# Patient Record
Sex: Male | Born: 1935 | Race: Black or African American | Hispanic: No | Marital: Single | Smoking: Former smoker
Health system: Southern US, Community
[De-identification: ages and names within clinical notes are randomized; demographics above are authoritative.]

## PROBLEM LIST (undated history)

## (undated) DIAGNOSIS — K269 Duodenal ulcer, unspecified as acute or chronic, without hemorrhage or perforation: Secondary | ICD-10-CM

## (undated) DIAGNOSIS — I429 Cardiomyopathy, unspecified: Secondary | ICD-10-CM

## (undated) DIAGNOSIS — I739 Peripheral vascular disease, unspecified: Secondary | ICD-10-CM

## (undated) DIAGNOSIS — D649 Anemia, unspecified: Secondary | ICD-10-CM

## (undated) DIAGNOSIS — K649 Unspecified hemorrhoids: Secondary | ICD-10-CM

## (undated) DIAGNOSIS — I10 Essential (primary) hypertension: Secondary | ICD-10-CM

## (undated) DIAGNOSIS — J45909 Unspecified asthma, uncomplicated: Secondary | ICD-10-CM

## (undated) DIAGNOSIS — Z950 Presence of cardiac pacemaker: Secondary | ICD-10-CM

## (undated) DIAGNOSIS — R0602 Shortness of breath: Secondary | ICD-10-CM

## (undated) DIAGNOSIS — I499 Cardiac arrhythmia, unspecified: Secondary | ICD-10-CM

## (undated) DIAGNOSIS — M1990 Unspecified osteoarthritis, unspecified site: Secondary | ICD-10-CM

## (undated) DIAGNOSIS — I495 Sick sinus syndrome: Secondary | ICD-10-CM

## (undated) DIAGNOSIS — I509 Heart failure, unspecified: Secondary | ICD-10-CM

## (undated) DIAGNOSIS — C61 Malignant neoplasm of prostate: Secondary | ICD-10-CM

## (undated) DIAGNOSIS — I4891 Unspecified atrial fibrillation: Secondary | ICD-10-CM

## (undated) DIAGNOSIS — K2210 Ulcer of esophagus without bleeding: Secondary | ICD-10-CM

## (undated) DIAGNOSIS — E785 Hyperlipidemia, unspecified: Secondary | ICD-10-CM

## (undated) DIAGNOSIS — K449 Diaphragmatic hernia without obstruction or gangrene: Secondary | ICD-10-CM

## (undated) HISTORY — PX: PACEMAKER INSERTION: SHX728

## (undated) HISTORY — DX: Anemia, unspecified: D64.9

## (undated) HISTORY — PX: CATARACT EXTRACTION, BILATERAL: SHX1313

## (undated) HISTORY — PX: JOINT REPLACEMENT: SHX530

## (undated) HISTORY — DX: Presence of cardiac pacemaker: Z95.0

## (undated) HISTORY — PX: INSERT / REPLACE / REMOVE PACEMAKER: SUR710

## (undated) HISTORY — PX: PROSTATE SURGERY: SHX751

## (undated) HISTORY — PX: EYE SURGERY: SHX253

---

## 2004-09-30 ENCOUNTER — Other Ambulatory Visit: Payer: Self-pay

## 2004-10-07 ENCOUNTER — Inpatient Hospital Stay: Payer: Self-pay | Admitting: Specialist

## 2006-09-04 ENCOUNTER — Emergency Department: Payer: Self-pay | Admitting: Emergency Medicine

## 2006-10-22 ENCOUNTER — Emergency Department: Payer: Self-pay | Admitting: Emergency Medicine

## 2006-11-04 ENCOUNTER — Emergency Department: Payer: Self-pay | Admitting: Emergency Medicine

## 2008-08-09 ENCOUNTER — Ambulatory Visit: Payer: Self-pay | Admitting: Internal Medicine

## 2009-01-12 ENCOUNTER — Ambulatory Visit: Payer: Self-pay | Admitting: Internal Medicine

## 2010-01-17 ENCOUNTER — Ambulatory Visit: Payer: Self-pay | Admitting: Internal Medicine

## 2010-01-18 ENCOUNTER — Ambulatory Visit: Payer: Self-pay | Admitting: Internal Medicine

## 2011-02-22 ENCOUNTER — Emergency Department: Payer: Self-pay | Admitting: Unknown Physician Specialty

## 2013-12-29 ENCOUNTER — Ambulatory Visit: Payer: Self-pay | Admitting: Internal Medicine

## 2014-08-23 ENCOUNTER — Emergency Department: Payer: Self-pay | Admitting: Emergency Medicine

## 2015-08-06 ENCOUNTER — Encounter: Payer: Self-pay | Admitting: Emergency Medicine

## 2015-08-06 ENCOUNTER — Observation Stay
Admission: EM | Admit: 2015-08-06 | Discharge: 2015-08-09 | Disposition: A | Discharge status: Home / Self Care | Payer: Medicare Other | Attending: Internal Medicine | Admitting: Internal Medicine

## 2015-08-06 ENCOUNTER — Emergency Department: Payer: Medicare Other

## 2015-08-06 DIAGNOSIS — I495 Sick sinus syndrome: Secondary | ICD-10-CM | POA: Diagnosis not present

## 2015-08-06 DIAGNOSIS — I509 Heart failure, unspecified: Secondary | ICD-10-CM | POA: Diagnosis not present

## 2015-08-06 DIAGNOSIS — Z23 Encounter for immunization: Secondary | ICD-10-CM | POA: Insufficient documentation

## 2015-08-06 DIAGNOSIS — N281 Cyst of kidney, acquired: Secondary | ICD-10-CM | POA: Diagnosis not present

## 2015-08-06 DIAGNOSIS — K2950 Unspecified chronic gastritis without bleeding: Secondary | ICD-10-CM | POA: Insufficient documentation

## 2015-08-06 DIAGNOSIS — Z87891 Personal history of nicotine dependence: Secondary | ICD-10-CM | POA: Insufficient documentation

## 2015-08-06 DIAGNOSIS — Z9660 Presence of unspecified orthopedic joint implant: Secondary | ICD-10-CM | POA: Diagnosis not present

## 2015-08-06 DIAGNOSIS — Z8546 Personal history of malignant neoplasm of prostate: Secondary | ICD-10-CM | POA: Diagnosis not present

## 2015-08-06 DIAGNOSIS — B9681 Helicobacter pylori [H. pylori] as the cause of diseases classified elsewhere: Secondary | ICD-10-CM | POA: Insufficient documentation

## 2015-08-06 DIAGNOSIS — R1011 Right upper quadrant pain: Secondary | ICD-10-CM | POA: Insufficient documentation

## 2015-08-06 DIAGNOSIS — K259 Gastric ulcer, unspecified as acute or chronic, without hemorrhage or perforation: Secondary | ICD-10-CM | POA: Diagnosis not present

## 2015-08-06 DIAGNOSIS — Z950 Presence of cardiac pacemaker: Secondary | ICD-10-CM | POA: Diagnosis not present

## 2015-08-06 DIAGNOSIS — E785 Hyperlipidemia, unspecified: Secondary | ICD-10-CM | POA: Diagnosis not present

## 2015-08-06 DIAGNOSIS — Z9079 Acquired absence of other genital organ(s): Secondary | ICD-10-CM | POA: Insufficient documentation

## 2015-08-06 DIAGNOSIS — Z79899 Other long term (current) drug therapy: Secondary | ICD-10-CM | POA: Diagnosis not present

## 2015-08-06 DIAGNOSIS — K269 Duodenal ulcer, unspecified as acute or chronic, without hemorrhage or perforation: Secondary | ICD-10-CM | POA: Diagnosis not present

## 2015-08-06 DIAGNOSIS — K449 Diaphragmatic hernia without obstruction or gangrene: Secondary | ICD-10-CM | POA: Insufficient documentation

## 2015-08-06 DIAGNOSIS — I129 Hypertensive chronic kidney disease with stage 1 through stage 4 chronic kidney disease, or unspecified chronic kidney disease: Secondary | ICD-10-CM | POA: Insufficient documentation

## 2015-08-06 DIAGNOSIS — M549 Dorsalgia, unspecified: Secondary | ICD-10-CM | POA: Diagnosis not present

## 2015-08-06 DIAGNOSIS — R1013 Epigastric pain: Secondary | ICD-10-CM | POA: Diagnosis present

## 2015-08-06 DIAGNOSIS — I2510 Atherosclerotic heart disease of native coronary artery without angina pectoris: Secondary | ICD-10-CM | POA: Diagnosis not present

## 2015-08-06 DIAGNOSIS — I868 Varicose veins of other specified sites: Secondary | ICD-10-CM | POA: Diagnosis not present

## 2015-08-06 DIAGNOSIS — R109 Unspecified abdominal pain: Secondary | ICD-10-CM | POA: Diagnosis present

## 2015-08-06 DIAGNOSIS — R112 Nausea with vomiting, unspecified: Secondary | ICD-10-CM | POA: Diagnosis not present

## 2015-08-06 DIAGNOSIS — N183 Chronic kidney disease, stage 3 unspecified: Secondary | ICD-10-CM | POA: Diagnosis present

## 2015-08-06 DIAGNOSIS — E43 Unspecified severe protein-calorie malnutrition: Secondary | ICD-10-CM | POA: Insufficient documentation

## 2015-08-06 DIAGNOSIS — I700 Atherosclerosis of aorta: Secondary | ICD-10-CM | POA: Diagnosis not present

## 2015-08-06 DIAGNOSIS — M5135 Other intervertebral disc degeneration, thoracolumbar region: Secondary | ICD-10-CM | POA: Diagnosis not present

## 2015-08-06 DIAGNOSIS — I10 Essential (primary) hypertension: Secondary | ICD-10-CM | POA: Diagnosis present

## 2015-08-06 DIAGNOSIS — K648 Other hemorrhoids: Secondary | ICD-10-CM | POA: Diagnosis not present

## 2015-08-06 DIAGNOSIS — K2210 Ulcer of esophagus without bleeding: Secondary | ICD-10-CM | POA: Insufficient documentation

## 2015-08-06 DIAGNOSIS — I4891 Unspecified atrial fibrillation: Secondary | ICD-10-CM | POA: Insufficient documentation

## 2015-08-06 DIAGNOSIS — K219 Gastro-esophageal reflux disease without esophagitis: Secondary | ICD-10-CM | POA: Diagnosis not present

## 2015-08-06 DIAGNOSIS — E7800 Pure hypercholesterolemia, unspecified: Secondary | ICD-10-CM | POA: Diagnosis present

## 2015-08-06 HISTORY — DX: Sick sinus syndrome: I49.5

## 2015-08-06 HISTORY — DX: Hyperlipidemia, unspecified: E78.5

## 2015-08-06 HISTORY — DX: Unspecified atrial fibrillation: I48.91

## 2015-08-06 HISTORY — DX: Heart failure, unspecified: I50.9

## 2015-08-06 HISTORY — DX: Essential (primary) hypertension: I10

## 2015-08-06 LAB — COMPREHENSIVE METABOLIC PANEL
ALT: 19 U/L (ref 17–63)
AST: 27 U/L (ref 15–41)
Albumin: 4 g/dL (ref 3.5–5.0)
Alkaline Phosphatase: 55 U/L (ref 38–126)
Anion gap: 12 (ref 5–15)
BUN: 24 mg/dL — ABNORMAL HIGH (ref 6–20)
CALCIUM: 10.6 mg/dL — AB (ref 8.9–10.3)
CO2: 29 mmol/L (ref 22–32)
CREATININE: 1.63 mg/dL — AB (ref 0.61–1.24)
Chloride: 88 mmol/L — ABNORMAL LOW (ref 101–111)
GFR calc Af Amer: 41 mL/min — ABNORMAL LOW (ref >60)
GFR calc non Af Amer: 35 mL/min — ABNORMAL LOW (ref >60)
Glucose, Bld: 120 mg/dL — ABNORMAL HIGH (ref 65–99)
Potassium: 4.4 mmol/L (ref 3.5–5.1)
Sodium: 129 mmol/L — ABNORMAL LOW (ref 135–145)
Total Bilirubin: 0.2 mg/dL — ABNORMAL LOW (ref 0.3–1.2)
Total Protein: 7.3 g/dL (ref 6.5–8.1)

## 2015-08-06 LAB — URINALYSIS COMPLETE WITH MICROSCOPIC (ARMC ONLY)
BILIRUBIN URINE: NEGATIVE
Bacteria, UA: NONE SEEN
Glucose, UA: NEGATIVE mg/dL
Hgb urine dipstick: NEGATIVE
KETONES UR: NEGATIVE mg/dL
Leukocytes, UA: NEGATIVE
Nitrite: NEGATIVE
Protein, ur: 30 mg/dL — AB
Specific Gravity, Urine: 1.013 (ref 1.005–1.030)
Squamous Epithelial / LPF: NONE SEEN
WBC, UA: NONE SEEN WBC/hpf (ref 0–5)
pH: 7 (ref 5.0–8.0)

## 2015-08-06 LAB — TROPONIN I: Troponin I: <0.03 ng/mL (ref <0.031)

## 2015-08-06 LAB — CBC
HCT: 35.3 % — ABNORMAL LOW (ref 40.0–52.0)
Hemoglobin: 12 g/dL — ABNORMAL LOW (ref 13.0–18.0)
MCH: 29.6 pg (ref 26.0–34.0)
MCHC: 33.9 g/dL (ref 32.0–36.0)
MCV: 87.4 fL (ref 80.0–100.0)
PLATELETS: 295 10*3/uL (ref 150–440)
RBC: 4.04 MIL/uL — ABNORMAL LOW (ref 4.40–5.90)
RDW: 14.9 % — ABNORMAL HIGH (ref 11.5–14.5)
WBC: 12.9 10*3/uL — AB (ref 3.8–10.6)

## 2015-08-06 LAB — LIPASE, BLOOD: Lipase: 20 U/L — ABNORMAL LOW (ref 22–51)

## 2015-08-06 MED ORDER — MORPHINE SULFATE (PF) 2 MG/ML IV SOLN
INTRAVENOUS | Status: AC
  Administered 2015-08-06: 2 mg via INTRAVENOUS
  Filled 2015-08-06: qty 1

## 2015-08-06 MED ORDER — METOCLOPRAMIDE HCL 5 MG/ML IJ SOLN
10.0000 mg | Freq: Once | INTRAMUSCULAR | Status: AC
  Administered 2015-08-06: 10 mg via INTRAVENOUS
  Filled 2015-08-06: qty 2

## 2015-08-06 MED ORDER — IOHEXOL 240 MG/ML SOLN
25.0000 mL | Freq: Once | INTRAMUSCULAR | Status: AC | PRN
  Administered 2015-08-06: 25 mL via ORAL

## 2015-08-06 MED ORDER — ONDANSETRON HCL 4 MG/2ML IJ SOLN
4.0000 mg | Freq: Once | INTRAMUSCULAR | Status: AC
  Administered 2015-08-06: 4 mg via INTRAVENOUS
  Filled 2015-08-06: qty 2

## 2015-08-06 MED ORDER — SODIUM CHLORIDE 0.9 % IV BOLUS (SEPSIS)
1000.0000 mL | Freq: Once | INTRAVENOUS | Status: AC
  Administered 2015-08-06: 1000 mL via INTRAVENOUS

## 2015-08-06 MED ORDER — MORPHINE SULFATE (PF) 2 MG/ML IV SOLN
2.0000 mg | Freq: Once | INTRAVENOUS | Status: AC
  Administered 2015-08-06: 2 mg via INTRAVENOUS

## 2015-08-06 MED ORDER — IOHEXOL 300 MG/ML  SOLN
75.0000 mL | Freq: Once | INTRAMUSCULAR | Status: AC | PRN
Start: 2015-08-06 — End: 2015-08-06
  Administered 2015-08-06: 75 mL via INTRAVENOUS

## 2015-08-06 MED ORDER — ONDANSETRON HCL 4 MG/2ML IJ SOLN
INTRAMUSCULAR | Status: AC
  Administered 2015-08-06: 4 mg via INTRAVENOUS
  Filled 2015-08-06: qty 2

## 2015-08-06 MED ORDER — MORPHINE SULFATE (PF) 2 MG/ML IV SOLN
2.0000 mg | Freq: Once | INTRAVENOUS | Status: AC
  Administered 2015-08-06: 2 mg via INTRAVENOUS
  Filled 2015-08-06: qty 1

## 2015-08-06 NOTE — ED Notes (Signed)
Pt states he has been nauseated and vomiting for about a week now, has had a decreased appetite, feels dehydrated, and having pain in his back d/t arthritis.

## 2015-08-06 NOTE — H&P (Signed)
[facility] NAME: Devin Washington    MR#:  [id number]  DATE OF BIRTH:  06/18/1923  DATE OF ADMISSION:  08/06/2015  PRIMARY CARE PHYSICIAN: No primary care provider on file.   REQUESTING/REFERRING PHYSICIAN: Clearnce Hasten, M.D.  CHIEF COMPLAINT:   Chief Complaint  Patient presents with  . Nausea  . Dehydration  . Emesis    HISTORY OF PRESENT ILLNESS:  Devin Washington  is a 81 y.o. male who presents with persistent worsening abdominal pain with nausea and vomiting. Patient states that for the past month he's had this progressive intermittent but with increasing frequency abdominal pain associated with nausea and vomiting. He states that for the past 3 days he has not been able to keep much of anything down by mouth. Patient correlates this pain with the pain medication that he's been taking for his arthritis in his back. Since his PCP has changed his pain medication 2 or 3 times due to the abdominal discomfort he was having. Patient states that at times he has been able to eat when he has abdominal pain, eating seems to have helped ease the pain. Pain is epigastric, and at its worst is rated as a 10 out of 10. Pain does not radiate. Patient states that he takes all of his medicine the same time each day, and that when he does it feels like "someone is cutting across my stomach with a knife". Workup in the ED is largely benign, with a negative CT abdomen and pelvis. Hospitalists were called for admission for intractable pain with nausea and vomiting.  PAST MEDICAL HISTORY:   Past Medical History  Diagnosis Date  . Hypertension   . HLD (hyperlipidemia)   . SSS (sick sinus syndrome)   . CHF (congestive heart failure)   . A-fib   . CKD (chronic kidney disease), stage III     PAST SURGICAL HISTORY:   Past Surgical History  Procedure Laterality Date  . Pacemaker insertion    . Joint replacement    . Prostate surgery      SOCIAL  HISTORY:   Social History  Substance Use Topics  . Smoking status: Former Research scientist (life sciences)  . Smokeless tobacco: Not on file  . Alcohol Use: No    FAMILY HISTORY:   Family History  Problem Relation Age of Onset  . Family history unknown: Yes    DRUG ALLERGIES:  No Known Allergies  MEDICATIONS AT HOME:   Prior to Admission medications   Medication Sig Start Date End Date Taking? Authorizing Provider  benazepril (LOTENSIN) 20 MG tablet Take 20 mg by mouth daily.   Yes Historical Provider, MD  simvastatin (ZOCOR) 20 MG tablet Take 20 mg by mouth daily.   Yes Historical Provider, MD  traMADol (ULTRAM) 50 MG tablet Take by mouth every 6 (six) hours as needed.   Yes Historical Provider, MD  triamterene-hydrochlorothiazide (DYAZIDE) 37.5-25 MG per capsule Take 1 capsule by mouth daily.   Yes Historical Provider, MD    REVIEW OF SYSTEMS:  Review of Systems  Constitutional: Negative for fever, chills, weight loss and malaise/fatigue.  HENT: Negative for ear pain, hearing loss and tinnitus.   Eyes: Negative for blurred vision, double vision, pain and redness.  Respiratory: Negative for cough, hemoptysis and shortness of breath.   Cardiovascular: Negative for chest pain, palpitations, orthopnea and leg swelling.  Gastrointestinal: Positive for nausea, vomiting and abdominal pain. Negative for diarrhea and constipation.  Genitourinary: Negative for  dysuria, frequency and hematuria.  Musculoskeletal: Negative for back pain, joint pain and neck pain.  Skin:       No acne, rash, or lesions  Neurological: Negative for dizziness, tremors, focal weakness and weakness.  Endo/Heme/Allergies: Negative for polydipsia. Does not bruise/bleed easily.  Psychiatric/Behavioral: Negative for depression. The patient is not nervous/anxious and does not have insomnia.      VITAL SIGNS:   Filed Vitals:   08/06/15 2015 08/06/15 2030 08/06/15 2045 08/06/15 2100  BP:  175/92  168/104  Pulse:  95  103  Temp:       TempSrc:      Resp: 21 29 21 16   Height:      Weight:      SpO2:  100%  99%   Wt Readings from Last 3 Encounters:  08/06/15 81.194 kg (179 lb)    PHYSICAL EXAMINATION:  Physical Exam  Vitals reviewed. Constitutional: He is oriented to person, place, and time. He appears well-developed and well-nourished. No distress.  HENT:  Head: Normocephalic and atraumatic.  Mouth/Throat: Oropharynx is clear and moist.  Eyes: Conjunctivae and EOM are normal. Pupils are equal, round, and reactive to light. No scleral icterus.  Neck: Normal range of motion. Neck supple. No JVD present. No thyromegaly present.  Cardiovascular: Regular rhythm and intact distal pulses.  Exam reveals no gallop and no friction rub.   No murmur heard. Tachycardic, paced  Respiratory: Effort normal and breath sounds normal. No respiratory distress. He has no wheezes. He has no rales.  GI: Soft. Bowel sounds are normal. He exhibits no distension. There is tenderness (epigastric tenderness). There is no rebound and no guarding.  Musculoskeletal: Normal range of motion. He exhibits no edema.  No arthritis, no gout  Lymphadenopathy:    He has no cervical adenopathy.  Neurological: He is alert and oriented to person, place, and time. No cranial nerve deficit.  No dysarthria, no aphasia  Skin: Skin is warm and dry. No rash noted. No erythema.  Psychiatric: He has a normal mood and affect. His behavior is normal. Judgment and thought content normal.    LABORATORY PANEL:   CBC  Recent Labs Lab 08/06/15 1619  WBC 12.9*  HGB 12.0*  HCT 35.3*  PLT 295   ------------------------------------------------------------------------------------------------------------------  Chemistries   Recent Labs Lab 08/06/15 1619  NA 129*  K 4.4  CL 88*  CO2 29  GLUCOSE 120*  BUN 24*  CREATININE 1.63*  CALCIUM 10.6*  AST 27  ALT 19  ALKPHOS 55  BILITOT 0.2*    ------------------------------------------------------------------------------------------------------------------  Cardiac Enzymes  Recent Labs Lab 08/06/15 1619  TROPONINI <0.03   ------------------------------------------------------------------------------------------------------------------  RADIOLOGY:  Dg Chest 1 View  08/06/2015   CLINICAL DATA:  Nauseated and vomiting for 2 weeks. Decreased appetite.  EXAM: CHEST  1 VIEW  COMPARISON:  12/29/2013  FINDINGS: Stable appearance of the left dual chamber cardiac pacemaker. Lungs are clear without airspace disease or pulmonary edema. Trachea is midline. Atherosclerotic calcifications at the aortic arch. Heart size is normal.  IMPRESSION: No acute chest findings.   Electronically Signed   By: Markus Daft M.D.   On: 08/06/2015 18:28   Ct Abdomen Pelvis W Contrast  08/06/2015   CLINICAL DATA:  Nausea and vomiting for 3 days, RIGHT flank pain today, history hypertension, former smoker  EXAM: CT ABDOMEN AND PELVIS WITH CONTRAST  TECHNIQUE: Multidetector CT imaging of the abdomen and pelvis was performed using the standard protocol following bolus administration of intravenous contrast. Sagittal  and coronal MPR images reconstructed from axial data set.  CONTRAST:  22mL OMNIPAQUE IOHEXOL 300 MG/ML SOLN IV. Dilute oral contrast.  COMPARISON:  None  FINDINGS: Peripheral basilar interstitial changes question fibrosis.  Extensive atherosclerotic calcifications of aorta and coronary arteries.  Distal abdominal aorta 2.8 x 2.6 cm image 48.  Aneurysmal dilatation of common iliac arteries 2.7 cm diameter RIGHT and 1.8 cm diameter LEFT.  Pacemaker leads RIGHT atrium and RIGHT ventricle.  Small LEFT renal cyst 1.7 x 1.4 cm.  Liver, spleen, pancreas, kidneys, and adrenal glands otherwise unremarkable.  Normal appendix.  Question rectal mass versus artifact from underdistention, area of soft tissue prominence 3.6 cm diameter.  Stomach and bowel loops otherwise  normal appearance.  Prior prostatectomy.  Unremarkable bladder and ureters.  No addition mass, adenopathy, free air, free fluid, hernia, or acute inflammatory process.  Multilevel degenerative disc disease changes of the thoracolumbar spine.  IMPRESSION: Extensive atherosclerotic disease changes with aneurysmal dilatation of the common iliac arteries bilaterally, less involving the distal abdominal aorta as discussed above.  Question rectal mass 3.6 cm diameter versus artifact from underdistention ; colonoscopy recommended to exclude rectal neoplasm.   Electronically Signed   By: Lavonia Dana M.D.   On: 08/06/2015 21:37   US Abdomen Limited Ruq  08/06/2015   CLINICAL DATA:  Right upper quadrant pain for a month.  EXAM: US ABDOMEN LIMITED - RIGHT UPPER QUADRANT  COMPARISON:  02/22/2011  FINDINGS: Gallbladder:  No gallstones or wall thickening visualized. No sonographic Murphy sign noted.  Common bile duct:  Diameter: 4 mm.  Liver:  No focal lesion identified. Within normal limits in parenchymal echogenicity. Main portal vein is patent.  IMPRESSION: Negative right upper quadrant ultrasound.   Electronically Signed   By: Markus Daft M.D.   On: 08/06/2015 18:46    EKG:   Orders placed or performed during the hospital encounter of 08/06/15  . ED EKG  . ED EKG    IMPRESSION AND PLAN:  Principal Problem:   Abdominal pain, epigastric - unclear etiology this time, question of a possible ulcer. We will admit him with pain and nausea control right now, trial PPI and Carafate. GI consult in the morning. Active Problems:   Nausea and vomiting - when necessary antiemetics on board. Gentle IV fluids for hydration.   HTN (hypertension) - continue home meds for this, currently controlled   HLD (hyperlipidemia) - continue home meds   Back pain - pain control as above   CKD (chronic kidney disease), stage III - creatinine is improved from the only other prior value we have in the system on chart review. We'll avoid  nephrotoxins, and continue to monitor.  All the records are reviewed and case discussed with ED provider. Management plans discussed with the patient and/or family.  DVT PROPHYLAXIS: SubQ heparin  ADMISSION STATUS: Observation  CODE STATUS: Full, the patient states he would not want any more than 1 resuscitation attempt in any given hospital stay. Advance Directive Documentation        Most Recent Value   Type of Advance Directive  Healthcare Power of Attorney, Living will   Pre-existing out of facility DNR order (yellow form or pink MOST form)     "MOST" Form in Place?        TOTAL TIME TAKING CARE OF THIS PATIENT: 45 minutes.    Khing Belcher [place] 08/06/2015, 10:52 PM  [facility]  Office  [phone]  CC: Primary care physician; No primary care  provider on file.

## 2015-08-06 NOTE — ED Provider Notes (Signed)
[facility] Emergency Department Provider Note  ____________________________________________  Time seen: Approximately 5:45 PM  I have reviewed the triage vital signs and the nursing notes.   HISTORY  Chief Complaint Nausea; Dehydration; and Emesis    HPI Devin Washington is a 81 y.o. male with a history of hypertension and chronic back pain who is presenting today with nausea vomiting and upper abdominal pain that is increasing over the past week. He says that he started having nausea and vomiting and decreased appetite about a month ago and thinks it may be related to different pain medications as doctors been prescribing for his back pain. He denies any back pain at this time and says that he has been dealing with this back pain for several years at this point. He denies any blood in his vomit. Says that he is able to keep some nails down. Is passing gas. No dysuria. Describes the abdominal pain is upper abdominal and right upper quadrant and cramping.   Past Medical History  Diagnosis Date  . Hypertension     There are no active problems to display for this patient.   Past Surgical History  Procedure Laterality Date  . Pacemaker insertion    . Joint replacement    . Prostate surgery      Current Outpatient Rx  Name  Route  Sig  Dispense  Refill  . benazepril (LOTENSIN) 20 MG tablet   Oral   Take 20 mg by mouth daily.         . simvastatin (ZOCOR) 20 MG tablet   Oral   Take 20 mg by mouth daily.         . traMADol (ULTRAM) 50 MG tablet   Oral   Take by mouth every 6 (six) hours as needed.         . triamterene-hydrochlorothiazide (DYAZIDE) 37.5-25 MG per capsule   Oral   Take 1 capsule by mouth daily.           Allergies Review of patient's allergies indicates no known allergies.  No family history on file.  Social History Social History  Substance Use Topics  . Smoking status: Former Research scientist (life sciences)  . Smokeless tobacco: None   . Alcohol Use: No    Review of Systems Constitutional: No fever/chills Eyes: No visual changes. ENT: No sore throat. Cardiovascular: Denies chest pain. Respiratory: Denies shortness of breath. Gastrointestinal:  No diarrhea.  No constipation. Genitourinary: Negative for dysuria. Musculoskeletal: Negative for back pain. Skin: Negative for rash. Neurological: Negative for headaches, focal weakness or numbness.  10-point ROS otherwise negative.  ____________________________________________   PHYSICAL EXAM:  VITAL SIGNS: ED Triage Vitals  Enc Vitals Group     BP 08/06/15 1544 183/106 mmHg     Pulse Rate 08/06/15 1544 111     Resp 08/06/15 1544 18     Temp 08/06/15 1544 99 F (37.2 C)     Temp Source 08/06/15 1544 Oral     SpO2 08/06/15 1544 98 %     Weight 08/06/15 1544 179 lb (81.194 kg)     Height 08/06/15 1544 5\' 9"  (1.753 m)     Head Cir --      Peak Flow --      Pain Score 08/06/15 1545 3     Pain Loc --      Pain Edu? --      Excl. in [place]? --     Constitutional: Alert and oriented. Well appearing and in  no acute distress. Eyes: Conjunctivae are normal. PERRL. EOMI. Head: Atraumatic. Nose: No congestion/rhinnorhea. Mouth/Throat: Mucous membranes are moist.  Oropharynx non-erythematous. Neck: No stridor.   Cardiovascular: Normal rate, regular rhythm. Grossly normal heart sounds.  Good peripheral circulation. Respiratory: Normal respiratory effort.  No retractions. Lungs CTAB. Gastrointestinal: Soft with tenderness to palpation to the right upper quadrant and epigastric region. No tenderness to the right lower quadrant. Palpated deeply without any tenderness, rebound or guarding across the lower abdomen. Negative tenderness over McBurney's point. Negative Murphy sign. No distention. No abdominal bruits. No CVA tenderness. Musculoskeletal: No lower extremity tenderness nor edema.  No joint effusions. Neurologic:  Normal speech and language. No gross focal neurologic  deficits are appreciated. No gait instability. Skin:  Skin is warm, dry and intact. No rash noted. Psychiatric: Mood and affect are normal. Speech and behavior are normal.  ____________________________________________   LABS (all labs ordered are listed, but only abnormal results are displayed)  Labs Reviewed  LIPASE, BLOOD - Abnormal; Notable for the following:    Lipase 20 (*)    All other components within normal limits  COMPREHENSIVE METABOLIC PANEL - Abnormal; Notable for the following:    Sodium 129 (*)    Chloride 88 (*)    Glucose, Bld 120 (*)    BUN 24 (*)    Creatinine, Ser 1.63 (*)    Calcium 10.6 (*)    Total Bilirubin 0.2 (*)    GFR calc non Af Amer 35 (*)    GFR calc Af Amer 41 (*)    All other components within normal limits  CBC - Abnormal; Notable for the following:    WBC 12.9 (*)    RBC 4.04 (*)    Hemoglobin 12.0 (*)    HCT 35.3 (*)    RDW 14.9 (*)    All other components within normal limits  URINALYSIS COMPLETEWITH MICROSCOPIC ([facility] ONLY) - Abnormal; Notable for the following:    Color, Urine YELLOW (*)    APPearance CLOUDY (*)    Protein, ur 30 (*)    All other components within normal limits  TROPONIN I   ____________________________________________  EKG  ED ECG REPORT I, Doran Stabler, the attending physician, personally viewed and interpreted this ECG.   Date: 08/06/2015  EKG Time: 1757  Rate: 100  Rhythm: Atrial sensed ventricular paced rhythm.  Axis: Normal  Intervals: Wide complex secondary to ventricular pacing.  ST&T Change: ST elevations within normal limits for ventricularly paced rhythm.  ____________________________________________  RADIOLOGY  No acute findings on the x-ray of the chest. I personally reviewed this image. Negative right upper quadrant ultrasound.  CAT scan with extensive atherosclerotic disease with aneurysmal dilatation of the common iliac arteries bilaterally. Question rectal mass which is 3.6 cm in  diameter. ____________________________________________   PROCEDURES    ____________________________________________   INITIAL IMPRESSION / ASSESSMENT AND PLAN / ED COURSE  Pertinent labs & imaging results that were available during my care of the patient were reviewed by me and considered in my medical decision making (see chart for details).  ----------------------------------------- 7:51 PM on 08/06/2015 -----------------------------------------  Assessed and now called into fetal position having increased pain. We'll give pain medication at this time. We'll proceed with CAT scan of the abdomen and pelvis.  ----------------------------------------- 10:19 PM on 08/06/2015 -----------------------------------------  Despite IV pain medication as well as multiple doses of antiemetics the patient is still nauseous and in pain. I discussed with him his CAT scan results as well  as lab results. Despite her being an obvious cause for the patient's presentation, I believe that will benefit him to stay overnight for IV fluid hydration as well as continued antiemetic and pain medications. He'll be admitted for intractable nausea as well as abdominal pain. Signed out to Dr. Jannifer Franklin. ____________________________________________   FINAL CLINICAL IMPRESSION(S) / ED DIAGNOSES  Intractable nausea and upper abdominal pain. Initial visit.    Orbie Pyo, MD 08/06/15 2220

## 2015-08-07 LAB — CBC
HCT: 30.2 % — ABNORMAL LOW (ref 40.0–52.0)
HEMOGLOBIN: 10.2 g/dL — AB (ref 13.0–18.0)
MCH: 29.3 pg (ref 26.0–34.0)
MCHC: 33.7 g/dL (ref 32.0–36.0)
MCV: 86.9 fL (ref 80.0–100.0)
Platelets: 254 10*3/uL (ref 150–440)
RBC: 3.48 MIL/uL — AB (ref 4.40–5.90)
RDW: 14.8 % — ABNORMAL HIGH (ref 11.5–14.5)
WBC: 10.8 10*3/uL — ABNORMAL HIGH (ref 3.8–10.6)

## 2015-08-07 LAB — BASIC METABOLIC PANEL
ANION GAP: 9 (ref 5–15)
BUN: 23 mg/dL — ABNORMAL HIGH (ref 6–20)
CO2: 27 mmol/L (ref 22–32)
Calcium: 9.3 mg/dL (ref 8.9–10.3)
Chloride: 94 mmol/L — ABNORMAL LOW (ref 101–111)
Creatinine, Ser: 1.6 mg/dL — ABNORMAL HIGH (ref 0.61–1.24)
GFR calc Af Amer: 42 mL/min — ABNORMAL LOW (ref >60)
GFR, EST NON AFRICAN AMERICAN: 36 mL/min — AB (ref >60)
GLUCOSE: 108 mg/dL — AB (ref 65–99)
POTASSIUM: 3.8 mmol/L (ref 3.5–5.1)
Sodium: 130 mmol/L — ABNORMAL LOW (ref 135–145)

## 2015-08-07 LAB — GLUCOSE, CAPILLARY: Glucose-Capillary: 191 mg/dL — ABNORMAL HIGH (ref 65–99)

## 2015-08-07 MED ORDER — PANTOPRAZOLE SODIUM 40 MG IV SOLR
40.0000 mg | Freq: Two times a day (BID) | INTRAVENOUS | Status: DC
  Administered 2015-08-07 – 2015-08-09 (×4): 40 mg via INTRAVENOUS
  Filled 2015-08-07 (×4): qty 40

## 2015-08-07 MED ORDER — TRIAMTERENE-HCTZ 37.5-25 MG PO CAPS
1.0000 | ORAL_CAPSULE | Freq: Every day | ORAL | Status: DC
  Administered 2015-08-08 – 2015-08-09 (×2): 1 via ORAL
  Filled 2015-08-07 (×5): qty 1

## 2015-08-07 MED ORDER — ACETAMINOPHEN 650 MG RE SUPP: 650.0000 mg | Freq: Four times a day (QID) | RECTAL | Status: DC | PRN

## 2015-08-07 MED ORDER — ONDANSETRON HCL 4 MG PO TABS: 4.0000 mg | ORAL_TABLET | Freq: Four times a day (QID) | ORAL | Status: DC | PRN

## 2015-08-07 MED ORDER — PNEUMOCOCCAL VAC POLYVALENT 25 MCG/0.5ML IJ INJ: 0.5000 mL | INJECTION | INTRAMUSCULAR | Status: DC

## 2015-08-07 MED ORDER — ONDANSETRON HCL 4 MG/2ML IJ SOLN
4.0000 mg | Freq: Four times a day (QID) | INTRAMUSCULAR | Status: DC | PRN
  Administered 2015-08-07: 01:00:00 4 mg via INTRAVENOUS
  Filled 2015-08-07: qty 2

## 2015-08-07 MED ORDER — SIMVASTATIN 20 MG PO TABS
20.0000 mg | ORAL_TABLET | Freq: Every day | ORAL | Status: DC
  Administered 2015-08-08 – 2015-08-09 (×2): 20 mg via ORAL
  Filled 2015-08-07 (×2): qty 1

## 2015-08-07 MED ORDER — ACETAMINOPHEN 325 MG PO TABS: 650.0000 mg | ORAL_TABLET | Freq: Four times a day (QID) | ORAL | Status: DC | PRN

## 2015-08-07 MED ORDER — PANTOPRAZOLE SODIUM 40 MG PO TBEC
40.0000 mg | DELAYED_RELEASE_TABLET | Freq: Every day | ORAL | Status: DC
  Administered 2015-08-07: 40 mg via ORAL
  Filled 2015-08-07: qty 1

## 2015-08-07 MED ORDER — PANTOPRAZOLE SODIUM 40 MG IV SOLR
40.0000 mg | INTRAVENOUS | Status: DC
  Administered 2015-08-07: 40 mg via INTRAVENOUS
  Filled 2015-08-07: qty 40

## 2015-08-07 MED ORDER — SUCRALFATE 1 GM/10ML PO SUSP
1.0000 g | Freq: Two times a day (BID) | ORAL | Status: DC
  Administered 2015-08-07 – 2015-08-09 (×5): 1 g via ORAL
  Filled 2015-08-07 (×5): qty 10

## 2015-08-07 MED ORDER — MORPHINE SULFATE (PF) 2 MG/ML IV SOLN: 2.0000 mg | INTRAVENOUS | Status: DC | PRN

## 2015-08-07 MED ORDER — SODIUM CHLORIDE 0.9 % IV SOLN
INTRAVENOUS | Status: DC
  Administered 2015-08-07: 01:00:00 via INTRAVENOUS

## 2015-08-07 MED ORDER — HEPARIN SODIUM (PORCINE) 5000 UNIT/ML IJ SOLN
5000.0000 [IU] | Freq: Three times a day (TID) | INTRAMUSCULAR | Status: DC
  Administered 2015-08-07 – 2015-08-08 (×5): 5000 [IU] via SUBCUTANEOUS
  Filled 2015-08-07 (×6): qty 1

## 2015-08-07 MED ORDER — BOOST / RESOURCE BREEZE PO LIQD
1.0000 | Freq: Three times a day (TID) | ORAL | Status: DC
  Administered 2015-08-07 (×2): 1 via ORAL

## 2015-08-07 MED ORDER — BENAZEPRIL HCL 10 MG PO TABS
20.0000 mg | ORAL_TABLET | Freq: Every day | ORAL | Status: DC
  Administered 2015-08-08 – 2015-08-09 (×2): 20 mg via ORAL
  Filled 2015-08-07 (×2): qty 2

## 2015-08-07 NOTE — Consult Note (Signed)
Subjective: Patient seen for epigastric pain, nausea and vomiting, abnormal CT scan with possible rectal mass. Please see full GI consult by Ms. Richards. Patient seen and  examined, chart reviewed. Patient presenting with epigastric pain occurring for at least 2 weeks following the initiation of using meloxicam for arthritic pain. He does have a history of reflux however he does not take any medications for this. On further discussion he has had some epigastric pain occasionally, and go over the period past 6 months.  She also had a CT scan done yesterday with a possible 3.6 cm mass in the rectum. He has never had a EGD or colonoscopy in the past.  Patient has an extensive cardiac history with sick sinus syndrome, congestive heart failure, and atrial fibrillation as well as a pacemaker. He follows with Dr. Clayborn Bigness, having been seen in that outpatient clinic about a month ago.    Objective: Vital signs in last 24 hours: Temp:  [98.6 F (37 C)-98.9 F (37.2 C)] 98.9 F (37.2 C) (08/23 2131) Pulse Rate:  [96-113] 97 (08/23 2131) Resp:  [16-27] 18 (08/23 2131) BP: (123-171)/(62-99) 123/64 mmHg (08/23 2131) SpO2:  [95 %-100 %] 100 % (08/23 2131) Weight:  [80.513 kg (177 lb 8 oz)] 80.513 kg (177 lb 8 oz) (08/23 0015) Blood pressure 123/64, pulse 97, temperature 98.9 F (37.2 C), temperature source Oral, resp. rate 18, height 5\' 9"  (1.753 m), weight 80.513 kg (177 lb 8 oz), SpO2 100 %.   Intake/Output from previous day: 08/22 0701 - 08/23 0700 In: -  Out: 700 [Urine:700]  Intake/Output this shift: Total I/O In: -  Out: 200 [Urine:200]   General appearance:  81 year old male no acute distress Resp:  Clear to auscultation Cardio:  Regular rate and rhythm GI:  Soft, mild tenderness to palpation in the left epigastric region. No masses or rebound. Bowel sounds are positive and normoactive Extremities:  No clubbing or cyanosis   Lab Results: Results for orders placed or performed  during the hospital encounter of 08/06/15 (from the past 24 hour(s))  Basic metabolic panel     Status: Abnormal   Collection Time: 08/07/15  4:52 AM  Result Value Ref Range   Sodium 130 (L) 135 - 145 mmol/L   Potassium 3.8 3.5 - 5.1 mmol/L   Chloride 94 (L) 101 - 111 mmol/L   CO2 27 22 - 32 mmol/L   Glucose, Bld 108 (H) 65 - 99 mg/dL   BUN 23 (H) 6 - 20 mg/dL   Creatinine, Ser 1.60 (H) 0.61 - 1.24 mg/dL   Calcium 9.3 8.9 - 10.3 mg/dL   GFR calc non Af Amer 36 (L) >60 mL/min   GFR calc Af Amer 42 (L) >60 mL/min   Anion gap 9 5 - 15  CBC     Status: Abnormal   Collection Time: 08/07/15  4:52 AM  Result Value Ref Range   WBC 10.8 (H) 3.8 - 10.6 K/uL   RBC 3.48 (L) 4.40 - 5.90 MIL/uL   Hemoglobin 10.2 (L) 13.0 - 18.0 g/dL   HCT 30.2 (L) 40.0 - 52.0 %   MCV 86.9 80.0 - 100.0 fL   MCH 29.3 26.0 - 34.0 pg   MCHC 33.7 32.0 - 36.0 g/dL   RDW 14.8 (H) 11.5 - 14.5 %   Platelets 254 150 - 440 K/uL  Glucose, capillary     Status: Abnormal   Collection Time: 08/07/15  5:52 PM  Result Value Ref Range   Glucose-Capillary 191 (H)  65 - 99 mg/dL      Recent Labs  08/06/15 1619 08/07/15 0452  WBC 12.9* 10.8*  HGB 12.0* 10.2*  HCT 35.3* 30.2*  PLT 295 254   BMET  Recent Labs  08/06/15 1619 08/07/15 0452  NA 129* 130*  K 4.4 3.8  CL 88* 94*  CO2 29 27  GLUCOSE 120* 108*  BUN 24* 23*  CREATININE 1.63* 1.60*  CALCIUM 10.6* 9.3   LFT  Recent Labs  08/06/15 1619  PROT 7.3  ALBUMIN 4.0  AST 27  ALT 19  ALKPHOS 55  BILITOT 0.2*   PT/INR No results for input(s): LABPROT, INR in the last 72 hours. Hepatitis Panel No results for input(s): HEPBSAG, HCVAB, HEPAIGM, HEPBIGM in the last 72 hours. C-Diff No results for input(s): CDIFFTOX in the last 72 hours. No results for input(s): CDIFFPCR in the last 72 hours.   Studies/Results: Dg Chest 1 View  08/06/2015   CLINICAL DATA:  Nauseated and vomiting for 2 weeks. Decreased appetite.  EXAM: CHEST  1 VIEW  COMPARISON:   12/29/2013  FINDINGS: Stable appearance of the left dual chamber cardiac pacemaker. Lungs are clear without airspace disease or pulmonary edema. Trachea is midline. Atherosclerotic calcifications at the aortic arch. Heart size is normal.  IMPRESSION: No acute chest findings.   Electronically Signed   By: Markus Daft M.D.   On: 08/06/2015 18:28   Ct Abdomen Pelvis W Contrast  08/06/2015   CLINICAL DATA:  Nausea and vomiting for 3 days, RIGHT flank pain today, history hypertension, former smoker  EXAM: CT ABDOMEN AND PELVIS WITH CONTRAST  TECHNIQUE: Multidetector CT imaging of the abdomen and pelvis was performed using the standard protocol following bolus administration of intravenous contrast. Sagittal and coronal MPR images reconstructed from axial data set.  CONTRAST:  14mL OMNIPAQUE IOHEXOL 300 MG/ML SOLN IV. Dilute oral contrast.  COMPARISON:  None  FINDINGS: Peripheral basilar interstitial changes question fibrosis.  Extensive atherosclerotic calcifications of aorta and coronary arteries.  Distal abdominal aorta 2.8 x 2.6 cm image 48.  Aneurysmal dilatation of common iliac arteries 2.7 cm diameter RIGHT and 1.8 cm diameter LEFT.  Pacemaker leads RIGHT atrium and RIGHT ventricle.  Small LEFT renal cyst 1.7 x 1.4 cm.  Liver, spleen, pancreas, kidneys, and adrenal glands otherwise unremarkable.  Normal appendix.  Question rectal mass versus artifact from underdistention, area of soft tissue prominence 3.6 cm diameter.  Stomach and bowel loops otherwise normal appearance.  Prior prostatectomy.  Unremarkable bladder and ureters.  No addition mass, adenopathy, free air, free fluid, hernia, or acute inflammatory process.  Multilevel degenerative disc disease changes of the thoracolumbar spine.  IMPRESSION: Extensive atherosclerotic disease changes with aneurysmal dilatation of the common iliac arteries bilaterally, less involving the distal abdominal aorta as discussed above.  Question rectal mass 3.6 cm diameter  versus artifact from underdistention ; colonoscopy recommended to exclude rectal neoplasm.   Electronically Signed   By: Lavonia Dana M.D.   On: 08/06/2015 21:37   US Abdomen Limited Ruq  08/06/2015   CLINICAL DATA:  Right upper quadrant pain for a month.  EXAM: US ABDOMEN LIMITED - RIGHT UPPER QUADRANT  COMPARISON:  02/22/2011  FINDINGS: Gallbladder:  No gallstones or wall thickening visualized. No sonographic Murphy sign noted.  Common bile duct:  Diameter: 4 mm.  Liver:  No focal lesion identified. Within normal limits in parenchymal echogenicity. Main portal vein is patent.  IMPRESSION: Negative right upper quadrant ultrasound.   Electronically Signed  By: Markus Daft M.D.   On: 08/06/2015 18:46    Scheduled Inpatient Medications:   . benazepril  20 mg Oral Daily  . feeding supplement  1 Container Oral TID WC  . heparin  5,000 Units Subcutaneous 3 times per day  . pantoprazole (PROTONIX) IV  40 mg Intravenous Q12H  . [START ON 08/08/2015] pneumococcal 23 valent vaccine  0.5 mL Intramuscular Tomorrow-1000  . simvastatin  20 mg Oral Daily  . sucralfate  1 g Oral BID  . triamterene-hydrochlorothiazide  1 capsule Oral Daily    Continuous Inpatient Infusions:     PRN Inpatient Medications:  acetaminophen **OR** acetaminophen, morphine injection, ondansetron **OR** ondansetron (ZOFRAN) IV  Miscellaneous:   Assessment: 1. Epigastric pain nausea and vomiting in the setting of NSAID use 2. Abnormal CT scan, possible rectal mass 1.Plan:   Agree with both EGD as well as flexible sigmoidoscopy. These can both be done at the same time. I will tentatively plan these for tomorrow afternoon however for contemplating a sedated procedure I'm requesting cardiology risk stratification. Would change protonics to IV.  I have discussed the risks benefits and complications of procedures to include not limited to bleeding, infection, perforation and the risk of sedation and the patient wishes to  proceed.   Lollie Sails MD 08/07/2015, 9:52 PM

## 2015-08-07 NOTE — ED Notes (Signed)
Spoke to Dr. Jannifer Franklin concerning cardiac monitoring and hr up to 120's adding off unit telemetry.

## 2015-08-07 NOTE — Plan of Care (Signed)
Problem: Discharge Progression Outcomes Goal: Discharge plan in place and appropriate Outcome: Progressing Patient is from home alone Pt has a history of HTN, hyperlipidemia, SSS, CHF, afib, CKD, continue home medications Patient has a pacemaker, ventricular pacing High Fall Risk

## 2015-08-07 NOTE — Progress Notes (Signed)
Initial Nutrition Assessment  DOCUMENTATION CODES:   Severe malnutrition in context of chronic illness  INTERVENTION:   Medical Food Supplement Therapy: will recommend Boost Breeze po TID, each supplement provides 250 kcal and 9 grams of protein. Will recommend Ensure Enlive po BID, each supplement provides 350 kcal and 20 grams of protein, once diet able to be advanced as pt drinking PTA   NUTRITION DIAGNOSIS:   Inadequate oral intake related to poor appetite as evidenced by per patient/family report.  GOAL:   Patient will meet greater than or equal to 90% of their needs  MONITOR:    (Energy Intake, Digestive System, Anthropometrics)  REASON FOR ASSESSMENT:   Malnutrition Screening Tool    ASSESSMENT:   Pt admitted with n/v anorexia and abdominal pain.   Past Medical History  Diagnosis Date  . Hypertension   . HLD (hyperlipidemia)   . SSS (sick sinus syndrome)   . CHF (congestive heart failure)   . A-fib   . CKD (chronic kidney disease), stage III      Diet Order:  Diet clear liquid Room service appropriate?: Yes; Fluid consistency:: Thin    Current Nutrition: Pt NPO this am just advanced to CL, per documentation pt ate 100% of CL lunch tray.  Food/Nutrition-Related History: Pt and pt family reports poor po intake started before wife passed away 2 months ago. Pt was caring for ailing wife and eating less. Pt reports eating small portions with very poor appetite after her passing and the past month difficulty eating with GI intolerance. Pt reports very poor po intake secondary to GI discomfort for the past week. Pt reports drinking one Ensure a day as able PTA.   Medications: Protonix, carafate  Electrolyte/Renal Profile and Glucose Profile:   Recent Labs Lab 08/06/15 1619 08/07/15 0452  NA 129* 130*  K 4.4 3.8  CL 88* 94*  CO2 29 27  BUN 24* 23*  CREATININE 1.63* 1.60*  CALCIUM 10.6* 9.3  GLUCOSE 120* 108*   Protein Profile:   Recent Labs Lab  08/06/15 1619  ALBUMIN 4.0    Gastrointestinal Profile: Last BM:  08/04/2015   Nutrition-Focused Physical Exam Findings: Nutrition-Focused physical exam completed. Findings are mild fat depletion, moderate muscle depletion, and no edema.     Weight Change: Pt reports weight of 210lbs 9 months ago (16% weight loss in 9 months)   Skin:  Reviewed, no issues  Height:   Ht Readings from Last 1 Encounters:  08/07/15 5\' 9"  (1.753 m)    Weight:   Wt Readings from Last 1 Encounters:  08/07/15 177 lb 8 oz (80.513 kg)     BMI:  Body mass index is 26.2 kg/(m^2).  Estimated Nutritional Needs:   Kcal:  1915-2263kcals, BEE: 1450kcals, TEE: (IF 1.1-1.3)(AF 1.2)   Protein:  65-81g protein (0.8-1.0g/kg)  Fluid:  2013-2462mL of fluid (25-16mL/kg)   EDUCATION NEEDS:   No education needs identified at this time   [place], RD, LDN Pager [phone]

## 2015-08-07 NOTE — Progress Notes (Signed)
[facility] [place] NAME: Devin Washington    MR#:  [id number]  DATE OF BIRTH:  1923/09/21  SUBJECTIVE:  I feel a whole lot better. No vomiting or nausea. Feeling hungry  REVIEW OF SYSTEMS:   Review of Systems  Constitutional: Negative for fever, chills and weight loss.  HENT: Negative for ear discharge, ear pain and nosebleeds.   Eyes: Negative for blurred vision, pain and discharge.  Respiratory: Negative for sputum production, shortness of breath, wheezing and stridor.   Cardiovascular: Negative for chest pain, palpitations, orthopnea and PND.  Gastrointestinal: Positive for nausea. Negative for vomiting, abdominal pain and diarrhea.  Genitourinary: Negative for urgency and frequency.  Musculoskeletal: Negative for back pain and joint pain.  Neurological: Negative for sensory change, speech change, focal weakness and weakness.  Psychiatric/Behavioral: Negative for depression and hallucinations. The patient is not nervous/anxious.   All other systems reviewed and are negative.  Tolerating Diet: yes Tolerating PT: pending  DRUG ALLERGIES:  No Known Allergies  VITALS:  Blood pressure 131/73, pulse 97, temperature 98.8 F (37.1 C), temperature source Oral, resp. rate 18, height 5\' 9"  (1.753 m), weight 80.513 kg (177 lb 8 oz), SpO2 99 %.  PHYSICAL EXAMINATION:   Physical Exam  GENERAL:  81 y.o.-year-old patient lying in the bed with no acute distress.  EYES: Pupils equal, round, reactive to light and accommodation. No scleral icterus. Extraocular muscles intact.  HEENT: Head atraumatic, normocephalic. Oropharynx and nasopharynx clear.  NECK:  Supple, no jugular venous distention. No thyroid enlargement, no tenderness.  LUNGS: Normal breath sounds bilaterally, no wheezing, rales, rhonchi. No use of accessory muscles of respiration.  CARDIOVASCULAR: S1, S2 normal. No murmurs, rubs, or gallops.  ABDOMEN: Soft, nontender,  nondistended. Bowel sounds present. No organomegaly or mass.  EXTREMITIES: No cyanosis, clubbing or edema b/l.    NEUROLOGIC: Cranial nerves II through XII are intact. No focal Motor or sensory deficits b/l.   PSYCHIATRIC: The patient is alert and oriented x 3.  SKIN: No obvious rash, lesion, or ulcer.   LABORATORY PANEL:   CBC  Recent Labs Lab 08/07/15 0452  WBC 10.8*  HGB 10.2*  HCT 30.2*  PLT 254   Chemistries   Recent Labs Lab 08/06/15 1619 08/07/15 0452  NA 129* 130*  K 4.4 3.8  CL 88* 94*  CO2 29 27  GLUCOSE 120* 108*  BUN 24* 23*  CREATININE 1.63* 1.60*  CALCIUM 10.6* 9.3  AST 27  --   ALT 19  --   ALKPHOS 55  --   BILITOT 0.2*  --    Cardiac Enzymes  Recent Labs Lab 08/06/15 1619  TROPONINI <0.03    RADIOLOGY:  Dg Chest 1 View  08/06/2015   CLINICAL DATA:  Nauseated and vomiting for 2 weeks. Decreased appetite.  EXAM: CHEST  1 VIEW  COMPARISON:  12/29/2013  FINDINGS: Stable appearance of the left dual chamber cardiac pacemaker. Lungs are clear without airspace disease or pulmonary edema. Trachea is midline. Atherosclerotic calcifications at the aortic arch. Heart size is normal.  IMPRESSION: No acute chest findings.   Electronically Signed   By: Markus Daft M.D.   On: 08/06/2015 18:28   Ct Abdomen Pelvis W Contrast  08/06/2015   CLINICAL DATA:  Nausea and vomiting for 3 days, RIGHT flank pain today, history hypertension, former smoker  EXAM: CT ABDOMEN AND PELVIS WITH CONTRAST  TECHNIQUE: Multidetector CT imaging of the abdomen and pelvis was performed using  the standard protocol following bolus administration of intravenous contrast. Sagittal and coronal MPR images reconstructed from axial data set.  CONTRAST:  47mL OMNIPAQUE IOHEXOL 300 MG/ML SOLN IV. Dilute oral contrast.  COMPARISON:  None  FINDINGS: Peripheral basilar interstitial changes question fibrosis.  Extensive atherosclerotic calcifications of aorta and coronary arteries.  Distal abdominal aorta  2.8 x 2.6 cm image 48.  Aneurysmal dilatation of common iliac arteries 2.7 cm diameter RIGHT and 1.8 cm diameter LEFT.  Pacemaker leads RIGHT atrium and RIGHT ventricle.  Small LEFT renal cyst 1.7 x 1.4 cm.  Liver, spleen, pancreas, kidneys, and adrenal glands otherwise unremarkable.  Normal appendix.  Question rectal mass versus artifact from underdistention, area of soft tissue prominence 3.6 cm diameter.  Stomach and bowel loops otherwise normal appearance.  Prior prostatectomy.  Unremarkable bladder and ureters.  No addition mass, adenopathy, free air, free fluid, hernia, or acute inflammatory process.  Multilevel degenerative disc disease changes of the thoracolumbar spine.  IMPRESSION: Extensive atherosclerotic disease changes with aneurysmal dilatation of the common iliac arteries bilaterally, less involving the distal abdominal aorta as discussed above.  Question rectal mass 3.6 cm diameter versus artifact from underdistention ; colonoscopy recommended to exclude rectal neoplasm.   Electronically Signed   By: Lavonia Dana M.D.   On: 08/06/2015 21:37   US Abdomen Limited Ruq  08/06/2015   CLINICAL DATA:  Right upper quadrant pain for a month.  EXAM: US ABDOMEN LIMITED - RIGHT UPPER QUADRANT  COMPARISON:  02/22/2011  FINDINGS: Gallbladder:  No gallstones or wall thickening visualized. No sonographic Murphy sign noted.  Common bile duct:  Diameter: 4 mm.  Liver:  No focal lesion identified. Within normal limits in parenchymal echogenicity. Main portal vein is patent.  IMPRESSION: Negative right upper quadrant ultrasound.   Electronically Signed   By: Markus Daft M.D.   On: 08/06/2015 18:46     ASSESSMENT AND PLAN:  81 y.o. male who presents with persistent worsening abdominal pain with nausea and vomiting. Patient states that for the past month he's had this progressive intermittent but with increasing frequency abdominal pain associated with nausea and vomiting  *Abdominal pain, epigastric - unclear  etiology this time, question of a possible ulcer.  -IVF -start CLD -cont trial of PPI and Carafate. GI consult pending  *Abnormal CT abdomen with possible rectal mass -await GI eval and rec  * Nausea and vomiting - prn antiemetics. Gentle IV fluids for hydration.  *HTN (hypertension) - continue home meds for this, currently controlled  *HLD (hyperlipidemia) - continue home meds  * Back pain - pain control as above  *CKD (chronic kidney disease), stage III - creatinine is improved from the only other prior value we have in the system on chart review.  -avoid nephrotoxins,  Case discussed with Care Management/Social Worker. Management plans discussed with the patient, family and they are in agreement.  CODE STATUS: full  DVT Prophylaxis: Lovenox  TOTAL TIME TAKING CARE OF THIS PATIENT: 35 minutes.  >50% time spent on counselling and coordination of care  POSSIBLE D/C IN 1-2 DAYS, DEPENDING ON CLINICAL CONDITION.   Briann Sarchet M.D on 08/07/2015 at 12:12 PM  Between 7am to 6pm - Pager - [phone]  After 6pm go to [email or web address] - password [email or web address] [facility]  [facility]  Office  [phone]  CC: Primary care physician; No primary care provider on file.

## 2015-08-07 NOTE — Care Management Note (Signed)
Case Management Note  Patient Details  Name: Devin Washington MRN: [id number] Date of Birth: 01/29/1923  Subjective/Objective:   81yo Mr Devin Washington was admitted 08/06/15 to an Observation bed per c/o abdominal pain and nausea. Has a rectal mass. PCP=Dr Eason. Pharmacy= [facility]. Resides alone but reports that he has children and grandchildren who can provide assistance at home if needed and who check on him. Assistive equipment= a front wheel walker. No transportation needs, he either drives himself or family members drive him to appointments. Case Management will follow for discharge planning.                  Action/Plan:   Expected Discharge Date:                  Expected Discharge Plan:     In-House Referral:     Discharge planning Services     Post Acute Care Choice:    Choice offered to:     DME Arranged:    DME Agency:     HH Arranged:    [place] Agency:     Status of Service:     Medicare Important Message Given:    Date Medicare IM Given:    Medicare IM give by:    Date Additional Medicare IM Given:    Additional Medicare Important Message give by:     If discussed at [place] of Stay Meetings, dates discussed:    Additional Comments:  Ka Bench A, RN 08/07/2015, 12:56 PM

## 2015-08-07 NOTE — Plan of Care (Signed)
Problem: Discharge Progression Outcomes Goal: Other Discharge Outcomes/Goals Outcome: Progressing Pt is alert and oriented, c/o nausea. Zofran given once with relief. Standby assist to the bathroom. NPO except with medications. Ventricular paced with BBB on telemetry. Resting quietly.

## 2015-08-07 NOTE — Progress Notes (Signed)
   08/07/15 1000  Clinical Encounter Type  Visited With Patient and family together  Visit Type Initial;Spiritual support  Referral From Nurse  Consult/Referral To Chaplain  Spiritual Encounters  Spiritual Needs Prayer  Provided pastoral support, prayer and presence to patient and his grandson.  [place] [phone]

## 2015-08-07 NOTE — Consult Note (Signed)
GI Inpatient Consult Note  Reason for Consult: Abdominal Pain, nausea, vomiting   Attending Requesting Consult: Dr. Posey Pronto  History of Present Illness: Devin Washington is a 81 y.o. male with a significant history of bradycardia, sick sinus syndrome, P. AFib, with pacemaker.   He is followed by Dr. Clayborn Bigness for this.  He reports epigastric burning abdominal pain that is sore to the touch for the past month.  The last couple of days he has also had nausea and vomiting with 3 or 4 episodes of emesis  2 days ago, and 2 episodes yesterday.  He describes the emesis as "stinky liquid water"  .  He reports that eating or drinking some water helps his pain.  He reports that he has had back pain which his primary care doctor  diagnosed as arthritis and prescribed him Mobic.  He took Mobic for a while and felt it was upsetting his stomach, and he reports that the dosage  was changed several times, but still upset his stomach.  He eventually was switched to just tramadol.  He has increased stressors, he lost his wife a couple of months ago to ovarian cancer after 5 years of marriage, and he was her caregiver for 6 months.  He has had a weight loss of approximately 30 lb over the past 6 months.  He reports acid reflux 1 to 2 times a week, and he does not take anything for this.  He has a history of prostate cancer 12 years ago, his treatment was prostatectomy.  He had a left knee replacement 7 years ago.  He has never had an upper endoscopy or colonoscopy.     Past Medical History:  Past Medical History  Diagnosis Date  . Hypertension   . HLD (hyperlipidemia)   . SSS (sick sinus syndrome)   . CHF (congestive heart failure)   . A-fib   . CKD (chronic kidney disease), stage III     Problem List: Patient Active Problem List   Diagnosis Date Noted  . Abdominal pain, epigastric 08/06/2015  . Nausea and vomiting 08/06/2015  . HTN (hypertension) 08/06/2015  . HLD (hyperlipidemia) 08/06/2015  .  Back pain 08/06/2015  . CKD (chronic kidney disease), stage III 08/06/2015  . Abdominal pain 08/06/2015    Past Surgical History: Past Surgical History  Procedure Laterality Date  . Pacemaker insertion    . Joint replacement    . Prostate surgery      Allergies: No Known Allergies  Home Medications: Prescriptions prior to admission  Medication Sig Dispense Refill Last Dose  . benazepril (LOTENSIN) 20 MG tablet Take 20 mg by mouth daily.   08/06/2015 at Unknown time  . simvastatin (ZOCOR) 20 MG tablet Take 20 mg by mouth daily.   08/06/2015 at Unknown time  . traMADol (ULTRAM) 50 MG tablet Take by mouth every 6 (six) hours as needed.   08/06/2015 at Unknown time  . triamterene-hydrochlorothiazide (DYAZIDE) 37.5-25 MG per capsule Take 1 capsule by mouth daily.   08/06/2015 at Unknown time   Home medication reconciliation was completed with the patient.   Scheduled Inpatient Medications:   . benazepril  20 mg Oral Daily  . feeding supplement  1 Container Oral TID WC  . heparin  5,000 Units Subcutaneous 3 times per day  . pantoprazole (PROTONIX) IV  40 mg Intravenous Q24H  . [START ON 08/08/2015] pneumococcal 23 valent vaccine  0.5 mL Intramuscular Tomorrow-1000  . simvastatin  20 mg Oral Daily  .  sucralfate  1 g Oral BID  . triamterene-hydrochlorothiazide  1 capsule Oral Daily    Continuous Inpatient Infusions:     PRN Inpatient Medications:  acetaminophen **OR** acetaminophen, morphine injection, ondansetron **OR** ondansetron (ZOFRAN) IV  Family History: Family history is unknown by patient.  The patient's family history is negative for inflammatory bowel disorders, GI malignancy, or solid organ transplantation.  Social History:   reports that he has quit smoking. He does not have any smokeless tobacco history on file. He reports that he does not drink alcohol or use illicit drugs. The patient denies ETOH, tobacco, or drug use.   Review of Systems: Constitutional: 30 #  weight loss over past 6 months.  Eyes: No changes in vision. ENT: No oral lesions, sore throat, no teeth GI: see HPI.  Heme/Lymph: No easy bruising.  CV: No chest pain.  GU: No hematuria.  Integumentary: No rashes.  Neuro: No headaches.  Psych: No depression/anxiety.  Endocrine: No heat/cold intolerance.  Allergic/Immunologic: No urticaria.  Resp: No cough, SOB.  Musculoskeletal: No joint swelling.    Physical Examination: BP 123/62 mmHg  Pulse 96  Temp(Src) 98.6 F (37 C) (Oral)  Resp 19  Ht 5\' 9"  (1.753 m)  Wt 80.513 kg (177 lb 8 oz)  BMI 26.20 kg/m2  SpO2 100% Gen: NAD, alert and oriented x 4 HEENT: PEERLA, EOMI, Neck: supple, no JVD or thyromegaly Chest: CTA bilaterally, no wheezes, crackles, or other adventitious sounds CV: RRR, no m/g/c/r Abd: soft,mild epigastric tenderness, ND, +BS in all four quadrants; no HSM, guarding, ridigity, or rebound tenderness Ext: no edema, well perfused with 2+ pulses, Skin: no rash or lesions noted Lymph: no LAD  Data: Lab Results  Component Value Date   WBC 10.8* 08/07/2015   HGB 10.2* 08/07/2015   HCT 30.2* 08/07/2015   MCV 86.9 08/07/2015   PLT 254 08/07/2015    Recent Labs Lab 08/06/15 1619 08/07/15 0452  HGB 12.0* 10.2*   Lab Results  Component Value Date   NA 130* 08/07/2015   K 3.8 08/07/2015   CL 94* 08/07/2015   CO2 27 08/07/2015   BUN 23* 08/07/2015   CREATININE 1.60* 08/07/2015   Lab Results  Component Value Date   ALT 19 08/06/2015   AST 27 08/06/2015   ALKPHOS 55 08/06/2015   BILITOT 0.2* 08/06/2015   No results for input(s): APTT, INR, PTT in the last 168 hours.   Imaging: CLINICAL DATA: Nausea and vomiting for 3 days, RIGHT flank pain today, history hypertension, former smoker  EXAM: CT ABDOMEN AND PELVIS WITH CONTRAST  TECHNIQUE: Multidetector CT imaging of the abdomen and pelvis was performed using the standard protocol following bolus administration of intravenous contrast.  Sagittal and coronal MPR images reconstructed from axial data set.  CONTRAST: 53mL OMNIPAQUE IOHEXOL 300 MG/ML SOLN IV. Dilute oral contrast.  COMPARISON: None  FINDINGS: Peripheral basilar interstitial changes question fibrosis.  Extensive atherosclerotic calcifications of aorta and coronary arteries.  Distal abdominal aorta 2.8 x 2.6 cm image 48.  Aneurysmal dilatation of common iliac arteries 2.7 cm diameter RIGHT and 1.8 cm diameter LEFT.  Pacemaker leads RIGHT atrium and RIGHT ventricle.  Small LEFT renal cyst 1.7 x 1.4 cm.  Liver, spleen, pancreas, kidneys, and adrenal glands otherwise unremarkable.  Normal appendix.  Question rectal mass versus artifact from underdistention, area of soft tissue prominence 3.6 cm diameter.  Stomach and bowel loops otherwise normal appearance.  Prior prostatectomy.  Unremarkable bladder and ureters.  No addition mass, adenopathy,  free air, free fluid, hernia, or acute inflammatory process.  Multilevel degenerative disc disease changes of the thoracolumbar spine.  IMPRESSION: Extensive atherosclerotic disease changes with aneurysmal dilatation of the common iliac arteries bilaterally, less involving the distal abdominal aorta as discussed above.  Question rectal mass 3.6 cm diameter versus artifact from underdistention ; colonoscopy recommended to exclude rectal neoplasm.   Electronically Signed  By: Lavonia Dana M.D.  On: 08/06/2015 21:37   CLINICAL DATA: Nauseated and vomiting for 2 weeks. Decreased appetite.  EXAM: CHEST 1 VIEW  COMPARISON: 12/29/2013  FINDINGS: Stable appearance of the left dual chamber cardiac pacemaker. Lungs are clear without airspace disease or pulmonary edema. Trachea is midline. Atherosclerotic calcifications at the aortic arch. Heart size is normal.  IMPRESSION: No acute chest findings.   Electronically Signed  By: Markus Daft M.D.  On:  08/06/2015 18:28   CLINICAL DATA: Right upper quadrant pain for a month.  EXAM: US ABDOMEN LIMITED - RIGHT UPPER QUADRANT  COMPARISON: 02/22/2011  FINDINGS: Gallbladder:  No gallstones or wall thickening visualized. No sonographic Murphy sign noted.  Common bile duct:  Diameter: 4 mm.  Liver:  No focal lesion identified. Within normal limits in parenchymal echogenicity. Main portal vein is patent.  IMPRESSION: Negative right upper quadrant ultrasound.   Electronically Signed  By: Markus Daft M.D.  On: 08/06/2015 18:46  Assessment/Plan: Mr. Zaruba is a 81 y.o. male with epigastric abdominal pain, nausea and vomiting. He has a history of increased stressors, NSAID use and no PPI coverage. A  rectal mass was seen on his CT abdomen and pelvis.  He is s/p prostate cancer 12 years ago with prostectomy as only treatment.  Recommendations: We will proceed with an upper endoscopy and then follow with tap water enema and flex sig after we have cardiology clearance.  We recommend IV PPI coverage - protonix 40 mg BID.  Continue with clear liquids, nothing red or orange.  Thank you for the consult. Please call with questions or concerns.  Salvadore Farber, PA-C  I personally performed these services.

## 2015-08-08 ENCOUNTER — Encounter: Payer: Self-pay | Admitting: *Deleted

## 2015-08-08 DIAGNOSIS — E43 Unspecified severe protein-calorie malnutrition: Secondary | ICD-10-CM | POA: Insufficient documentation

## 2015-08-08 MED ORDER — SODIUM CHLORIDE 0.9 % IV SOLN
INTRAVENOUS | Status: DC
  Administered 2015-08-08: 14:00:00 via INTRAVENOUS

## 2015-08-08 NOTE — Plan of Care (Signed)
Problem: Discharge Progression Outcomes Goal: Discharge plan in place and appropriate Outcome: Progressing Patient is alert and oriented, no c/o of pain. 1 assist in the room. Patient has been NPO for possible EGD and sigmoidoscopy 8/24. Patient is ventricularly paced, BBB with underlying SR on telemetry.

## 2015-08-08 NOTE — Progress Notes (Signed)
Spoke with Dr. Gustavo Lah. Pt to have EGD/Flex Sig late tomorrow morning. Telephone order to hold 6am dose of Heparin and to give Tap Water Enemas at 1030am and 1130am.

## 2015-08-08 NOTE — Consult Note (Signed)
[name]  CARDIOLOGY CONSULT NOTE  Patient ID: Devin Washington MRN: [id number] DOB/AGE: 1923/12/15 81 y.o.  Admit date: 08/06/2015 Referring Physician Dr. Fritzi Mandes Primary Physician Dr. Rutherford Nail Primary Cardiologist Dr. Clayborn Bigness Reason for Consultation Preop eval for colonoscopy/egd  HPI: Patient is an 81 year old male with history of sick sinus syndrome with a permanent pacemaker, history of congestive heart failure but preserved LV function EF 50-55% with mild aortic valvular disease. He presented with nausea and vomiting and epigastric pain. He has a possible 3.6 cm mass in his rectum. He is being evaluated with regard to risk stratification prior to EGD/colonoscopy. He has no chest pain shortness of breath syncope or presyncope. His pacemaker is functioning normally.  ROS Review of Systems - General ROS: positive for  - Abdominal pain nausea and vomiting Respiratory ROS: no cough, shortness of breath, or wheezing Cardiovascular ROS: no chest pain or dyspnea on exertion Gastrointestinal ROS: positive for - abdominal pain, gas/bloating and nausea/vomiting Neurological ROS: no TIA or stroke symptoms   Past Medical History  Diagnosis Date  . Hypertension   . HLD (hyperlipidemia)   . SSS (sick sinus syndrome)   . CHF (congestive heart failure)   . A-fib   . CKD (chronic kidney disease), stage III     Family History  Problem Relation Age of Onset  . Family history unknown: Yes    Social History   Social History  . Marital Status: Married    Spouse Name: N/A  . Number of Children: N/A  . Years of Education: N/A   Occupational History  . Not on file.   Social History Main Topics  . Smoking status: Former Research scientist (life sciences)  . Smokeless tobacco: Not on file  . Alcohol Use: No  . Drug Use: No  . Sexual Activity: Not on file   Other Topics Concern  . Not on file   Social History Narrative  . No narrative on file    Past Surgical  History  Procedure Laterality Date  . Pacemaker insertion    . Joint replacement    . Prostate surgery       Prescriptions prior to admission  Medication Sig Dispense Refill Last Dose  . benazepril (LOTENSIN) 20 MG tablet Take 20 mg by mouth daily.   08/06/2015 at Unknown time  . simvastatin (ZOCOR) 20 MG tablet Take 20 mg by mouth daily.   08/06/2015 at Unknown time  . traMADol (ULTRAM) 50 MG tablet Take by mouth every 6 (six) hours as needed.   08/06/2015 at Unknown time  . triamterene-hydrochlorothiazide (DYAZIDE) 37.5-25 MG per capsule Take 1 capsule by mouth daily.   08/06/2015 at Unknown time    Physical Exam: Blood pressure 94/64, pulse 101, temperature 98.9 F (37.2 C), temperature source Oral, resp. rate 20, height 5\' 9"  (1.753 m), weight 80.513 kg (177 lb 8 oz), SpO2 95 %.    General appearance: alert and cooperative Resp: clear to auscultation bilaterally Cardio: regular rate and rhythm GI: abnormal findings:  moderate tenderness in the upper abdomen Extremities: extremities normal, atraumatic, no cyanosis or edema Neurologic: Grossly normal Labs:   Lab Results  Component Value Date   WBC 10.8* 08/07/2015   HGB 10.2* 08/07/2015   HCT 30.2* 08/07/2015   MCV 86.9 08/07/2015   PLT 254 08/07/2015    Recent Labs Lab 08/06/15 1619 08/07/15 0452  NA 129* 130*  K 4.4 3.8  CL 88* 94*  CO2 29 27  BUN 24*  23*  CREATININE 1.63* 1.60*  CALCIUM 10.6* 9.3  PROT 7.3  --   BILITOT 0.2*  --   ALKPHOS 55  --   ALT 19  --   AST 27  --   GLUCOSE 120* 108*   Lab Results  Component Value Date   TROPONINI <0.03 08/06/2015      Radiology: 3.6 cm mass in the rectum on CT scan. Chest x-ray showed no acute chest findings EKG: Ventricular paced rhythm  ASSESSMENT AND PLAN:  Patient with history of sick sinus syndrome, who was admitted with epigastric pain. Has permanent pacemaker in place which is functioning normally. Has a diagnosis of heart failure however has well-preserved  LV function. There is no evidence of heart failure on chest x-ray. Has a 3.6 cm mass noted on his rectum on CT scan. This will need further evaluation. Patient's pacemaker is functioning normally. He has no evidence of ischemia clinically or heart failure. Would proceed with EGD/colonoscopy and surgery if necessary with routine cardiac monitoring as he appears to be at optimize risk for this procedure. Signed: Teodoro Spray MD, [facility] 08/08/2015, 8:10 AM

## 2015-08-08 NOTE — Progress Notes (Signed)
Pt. Procedure will be performed tomorrow per Dr. Donnella Sham tap water enema not given in time/  Dr. Posey Pronto made aware.

## 2015-08-08 NOTE — Consult Note (Signed)
Subjective: Patient seen for epigastric pain and abnormal ct scan.  Unable to do proceedures this am as enemas were not given.  Patient feeling better, less abd pain, no n/v.   Objective: Vital signs in last 24 hours: Temp:  [98.6 F (37 C)-98.9 F (37.2 C)] 98.8 F (37.1 C) (08/24 1424) Pulse Rate:  [88-101] 93 (08/24 1424) Resp:  [17-21] 21 (08/24 1424) BP: (94-127)/(64-72) 127/72 mmHg (08/24 1424) SpO2:  [95 %-100 %] 100 % (08/24 1424) Blood pressure 127/72, pulse 93, temperature 98.8 F (37.1 C), temperature source Oral, resp. rate 21, height 5\' 9"  (1.753 m), weight 80.513 kg (177 lb 8 oz), SpO2 100 %.   Intake/Output from previous day: 08/23 0701 - 08/24 0700 In: 1480 [P.O.:1480] Out: 1600 [Urine:1600]  Intake/Output this shift: Total I/O In: -  Out: 200 [Urine:200]   General appearance:  Well appearing 81 yo male, nad Resp:  bcta Cardio:  rrr GI:  Soft, nt/nd/bowel sounds positive Extremities:  No cce   Lab Results: No results found for this or any previous visit (from the past 24 hour(s)).    Recent Labs  08/06/15 1619 08/07/15 0452  WBC 12.9* 10.8*  HGB 12.0* 10.2*  HCT 35.3* 30.2*  PLT 295 254   BMET  Recent Labs  08/06/15 1619 08/07/15 0452  NA 129* 130*  K 4.4 3.8  CL 88* 94*  CO2 29 27  GLUCOSE 120* 108*  BUN 24* 23*  CREATININE 1.63* 1.60*  CALCIUM 10.6* 9.3   LFT  Recent Labs  08/06/15 1619  PROT 7.3  ALBUMIN 4.0  AST 27  ALT 19  ALKPHOS 55  BILITOT 0.2*   PT/INR No results for input(s): LABPROT, INR in the last 72 hours. Hepatitis Panel No results for input(s): HEPBSAG, HCVAB, HEPAIGM, HEPBIGM in the last 72 hours. C-Diff No results for input(s): CDIFFTOX in the last 72 hours. No results for input(s): CDIFFPCR in the last 72 hours.   Studies/Results: Ct Abdomen Pelvis W Contrast  08/06/2015   CLINICAL DATA:  Nausea and vomiting for 3 days, RIGHT flank pain today, history hypertension, former smoker  EXAM: CT ABDOMEN  AND PELVIS WITH CONTRAST  TECHNIQUE: Multidetector CT imaging of the abdomen and pelvis was performed using the standard protocol following bolus administration of intravenous contrast. Sagittal and coronal MPR images reconstructed from axial data set.  CONTRAST:  25mL OMNIPAQUE IOHEXOL 300 MG/ML SOLN IV. Dilute oral contrast.  COMPARISON:  None  FINDINGS: Peripheral basilar interstitial changes question fibrosis.  Extensive atherosclerotic calcifications of aorta and coronary arteries.  Distal abdominal aorta 2.8 x 2.6 cm image 48.  Aneurysmal dilatation of common iliac arteries 2.7 cm diameter RIGHT and 1.8 cm diameter LEFT.  Pacemaker leads RIGHT atrium and RIGHT ventricle.  Small LEFT renal cyst 1.7 x 1.4 cm.  Liver, spleen, pancreas, kidneys, and adrenal glands otherwise unremarkable.  Normal appendix.  Question rectal mass versus artifact from underdistention, area of soft tissue prominence 3.6 cm diameter.  Stomach and bowel loops otherwise normal appearance.  Prior prostatectomy.  Unremarkable bladder and ureters.  No addition mass, adenopathy, free air, free fluid, hernia, or acute inflammatory process.  Multilevel degenerative disc disease changes of the thoracolumbar spine.  IMPRESSION: Extensive atherosclerotic disease changes with aneurysmal dilatation of the common iliac arteries bilaterally, less involving the distal abdominal aorta as discussed above.  Question rectal mass 3.6 cm diameter versus artifact from underdistention ; colonoscopy recommended to exclude rectal neoplasm.   Electronically Signed   By: Elta Guadeloupe  Thornton Papas M.D.   On: 08/06/2015 21:37    Scheduled Inpatient Medications:   . benazepril  20 mg Oral Daily  . feeding supplement  1 Container Oral TID WC  . heparin  5,000 Units Subcutaneous 3 times per day  . pantoprazole (PROTONIX) IV  40 mg Intravenous Q12H  . pneumococcal 23 valent vaccine  0.5 mL Intramuscular Tomorrow-1000  . simvastatin  20 mg Oral Daily  . sucralfate  1 g Oral  BID  . triamterene-hydrochlorothiazide  1 capsule Oral Daily    Continuous Inpatient Infusions:     PRN Inpatient Medications:  acetaminophen **OR** acetaminophen, morphine injection, ondansetron **OR** ondansetron (ZOFRAN) IV  Miscellaneous:   Assessment:  1) epigastric pain, n/v in the setting of nsaid use.   2) abnormal ct with possible rectal mass.   Plan:  1) egd and flexible sigmoidoscopy tomorrow, late am.  I have discussed the risks benefits and complications of procedures to include not limited to bleeding, infection, perforation and the risk of sedation and the patient wishes to proceed.  Lollie Sails MD 08/08/2015, 9:09 PM

## 2015-08-08 NOTE — Progress Notes (Signed)
[facility] [place] NAME: Devin Washington    MR#:  [id number]  DATE OF BIRTH:  [birth year]  SUBJECTIVE:  I feel a whole lot better. No vomiting or nausea. Feeling hungry  REVIEW OF SYSTEMS:   Review of Systems  Constitutional: Negative for fever, chills and weight loss.  HENT: Negative for ear discharge, ear pain and nosebleeds.   Eyes: Negative for blurred vision, pain and discharge.  Respiratory: Negative for sputum production, shortness of breath, wheezing and stridor.   Cardiovascular: Negative for chest pain, palpitations, orthopnea and PND.  Gastrointestinal: Positive for nausea. Negative for vomiting, abdominal pain and diarrhea.  Genitourinary: Negative for urgency and frequency.  Musculoskeletal: Negative for back pain and joint pain.  Neurological: Negative for sensory change, speech change, focal weakness and weakness.  Psychiatric/Behavioral: Negative for depression and hallucinations. The patient is not nervous/anxious.   All other systems reviewed and are negative.  Tolerating Diet: yes  DRUG ALLERGIES:  No Known Allergies  VITALS:  Blood pressure 113/66, pulse 88, temperature 98.6 F (37 C), temperature source Oral, resp. rate 17, height 5\' 9"  (1.753 m), weight 80.513 kg (177 lb 8 oz), SpO2 99 %.  PHYSICAL EXAMINATION:   Physical Exam  GENERAL:  81 y.o.-year-old patient lying in the bed with no acute distress.  EYES: Pupils equal, round, reactive to light and accommodation. No scleral icterus. Extraocular muscles intact.  HEENT: Head atraumatic, normocephalic. Oropharynx and nasopharynx clear.  NECK:  Supple, no jugular venous distention. No thyroid enlargement, no tenderness.  LUNGS: Normal breath sounds bilaterally, no wheezing, rales, rhonchi. No use of accessory muscles of respiration.  CARDIOVASCULAR: S1, S2 normal. No murmurs, rubs, or gallops.  ABDOMEN: Soft, nontender, nondistended. Bowel sounds present.  No organomegaly or mass.  EXTREMITIES: No cyanosis, clubbing or edema b/l.    NEUROLOGIC: Cranial nerves II through XII are intact. No focal Motor or sensory deficits b/l.   PSYCHIATRIC:alert and oriented x 3.  SKIN: No obvious rash, lesion, or ulcer.   LABORATORY PANEL:   CBC  Recent Labs Lab 08/07/15 0452  WBC 10.8*  HGB 10.2*  HCT 30.2*  PLT 254   Chemistries   Recent Labs Lab 08/06/15 1619 08/07/15 0452  NA 129* 130*  K 4.4 3.8  CL 88* 94*  CO2 29 27  GLUCOSE 120* 108*  BUN 24* 23*  CREATININE 1.63* 1.60*  CALCIUM 10.6* 9.3  AST 27  --   ALT 19  --   ALKPHOS 55  --   BILITOT 0.2*  --    Cardiac Enzymes  Recent Labs Lab 08/06/15 1619  TROPONINI <0.03    RADIOLOGY:  Dg Chest 1 View  08/06/2015   CLINICAL DATA:  Nauseated and vomiting for 2 weeks. Decreased appetite.  EXAM: CHEST  1 VIEW  COMPARISON:  12/29/2013  FINDINGS: Stable appearance of the left dual chamber cardiac pacemaker. Lungs are clear without airspace disease or pulmonary edema. Trachea is midline. Atherosclerotic calcifications at the aortic arch. Heart size is normal.  IMPRESSION: No acute chest findings.   Electronically Signed   By: Markus Daft M.D.   On: 08/06/2015 18:28   Ct Abdomen Pelvis W Contrast  08/06/2015   CLINICAL DATA:  Nausea and vomiting for 3 days, RIGHT flank pain today, history hypertension, former smoker  EXAM: CT ABDOMEN AND PELVIS WITH CONTRAST  TECHNIQUE: Multidetector CT imaging of the abdomen and pelvis was performed using the standard protocol following bolus administration of  intravenous contrast. Sagittal and coronal MPR images reconstructed from axial data set.  CONTRAST:  3mL OMNIPAQUE IOHEXOL 300 MG/ML SOLN IV. Dilute oral contrast.  COMPARISON:  None  FINDINGS: Peripheral basilar interstitial changes question fibrosis.  Extensive atherosclerotic calcifications of aorta and coronary arteries.  Distal abdominal aorta 2.8 x 2.6 cm image 48.  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  Pacemaker leads RIGHT atrium and RIGHT ventricle.  Small LEFT renal cyst 1.7 x 1.4 cm.  Liver, spleen, pancreas, kidneys, and adrenal glands otherwise unremarkable.  Normal appendix.  Question rectal mass versus artifact from underdistention, area of soft tissue prominence 3.6 cm diameter.  Stomach and bowel loops otherwise normal appearance.  Prior prostatectomy.  Unremarkable bladder and ureters.  No addition mass, adenopathy, free air, free fluid, hernia, or acute inflammatory process.  Multilevel degenerative disc disease changes of the thoracolumbar spine.  IMPRESSION: Extensive atherosclerotic disease changes with aneurysmal dilatation of the common iliac arteries bilaterally, less involving the distal abdominal aorta as discussed above.  Question rectal mass 3.6 cm diameter versus artifact from underdistention ; colonoscopy recommended to exclude rectal neoplasm.   Electronically Signed   By: Lavonia Dana M.D.   On: 08/06/2015 21:37   US Abdomen Limited Ruq  08/06/2015   CLINICAL DATA:  Right upper quadrant pain for a month.  EXAM: US ABDOMEN LIMITED - RIGHT UPPER QUADRANT  COMPARISON:  02/22/2011  FINDINGS: Gallbladder:  No gallstones or wall thickening visualized. No sonographic Murphy sign noted.  Common bile duct:  Diameter: 4 mm.  Liver:  No focal lesion identified. Within normal limits in parenchymal echogenicity. Main portal vein is patent.  IMPRESSION: Negative right upper quadrant ultrasound.   Electronically Signed   By: Markus Daft M.D.   On: 08/06/2015 18:46     ASSESSMENT AND PLAN:  81 y.o. male who presents with persistent worsening abdominal pain with nausea and vomiting. Patient states that for the past month he's had this progressive intermittent but with increasing frequency abdominal pain associated with nausea and vomiting  *Abdominal pain, epigastric - unclear etiology this time, question of a possible ulcer.   -IVF -started CLD -cont trial of PPI and Carafate. GI consult noted. EGD today and flex sig tomorrow(did not get prep) *Abnormal CT abdomen with possible rectal mass  * Nausea and vomiting - prn antiemetics. Gentle IV fluids for hydration.  *HTN (hypertension) - continue home meds for this, currently controlled  *HLD (hyperlipidemia) - continue home meds  * Back pain - pain control as above  *CKD (chronic kidney disease), stage III - creatinine is improved from the only other prior value we have in the system on chart review.  -avoid nephrotoxins,  Case discussed with Care Management/Social Worker. Management plans discussed with the patient, family and they are in agreement.  CODE STATUS: full  DVT Prophylaxis: Lovenox  TOTAL TIME TAKING CARE OF THIS PATIENT: 35 minutes.  >50% time spent on counselling and coordination of care  POSSIBLE D/C IN 1-2 DAYS, DEPENDING ON CLINICAL CONDITION.   Thor Nannini M.D on 08/08/2015 at 3:12 PM  Between 7am to 6pm - Pager - [phone]  After 6pm go to [email or web address] - password [email or web address] [facility]  [facility]  Office  [phone]  CC: Primary care physician; No primary care provider on file.

## 2015-08-09 ENCOUNTER — Encounter: Payer: Self-pay | Admitting: Anesthesiology

## 2015-08-09 ENCOUNTER — Encounter
Admission: EM | Disposition: A | Discharge status: Home / Self Care | Payer: Self-pay | Source: Home / Self Care | Attending: Emergency Medicine

## 2015-08-09 ENCOUNTER — Observation Stay: Payer: Medicare Other | Admitting: Anesthesiology

## 2015-08-09 DIAGNOSIS — K269 Duodenal ulcer, unspecified as acute or chronic, without hemorrhage or perforation: Secondary | ICD-10-CM

## 2015-08-09 DIAGNOSIS — K649 Unspecified hemorrhoids: Secondary | ICD-10-CM

## 2015-08-09 HISTORY — PX: FLEXIBLE SIGMOIDOSCOPY: SHX5431

## 2015-08-09 HISTORY — DX: Unspecified hemorrhoids: K64.9

## 2015-08-09 HISTORY — PX: ESOPHAGOGASTRODUODENOSCOPY: SHX5428

## 2015-08-09 HISTORY — DX: Duodenal ulcer, unspecified as acute or chronic, without hemorrhage or perforation: K26.9

## 2015-08-09 SURGERY — EGD (ESOPHAGOGASTRODUODENOSCOPY)
Anesthesia: General

## 2015-08-09 MED ORDER — PANTOPRAZOLE SODIUM 40 MG PO TBEC
40.0000 mg | DELAYED_RELEASE_TABLET | Freq: Two times a day (BID) | ORAL | Status: DC
  Filled 2015-08-09 (×3): qty 1

## 2015-08-09 MED ORDER — PROPOFOL INFUSION 10 MG/ML OPTIME
INTRAVENOUS | Status: DC | PRN
Start: 2015-08-09 — End: 2015-08-09
  Administered 2015-08-09: 120 ug/kg/min via INTRAVENOUS

## 2015-08-09 MED ORDER — LIDOCAINE HCL (CARDIAC) 20 MG/ML IV SOLN
INTRAVENOUS | Status: DC | PRN
  Administered 2015-08-09: 30 mg via INTRAVENOUS

## 2015-08-09 MED ORDER — MIDAZOLAM HCL 2 MG/2ML IJ SOLN
INTRAMUSCULAR | Status: DC | PRN
  Administered 2015-08-09: 1 mg via INTRAVENOUS

## 2015-08-09 MED ORDER — GLYCOPYRROLATE 0.2 MG/ML IJ SOLN
INTRAMUSCULAR | Status: DC | PRN
  Administered 2015-08-09: 0.1 mg via INTRAVENOUS

## 2015-08-09 MED ORDER — FENTANYL CITRATE (PF) 100 MCG/2ML IJ SOLN
INTRAMUSCULAR | Status: DC | PRN
  Administered 2015-08-09: 50 ug via INTRAVENOUS

## 2015-08-09 MED ORDER — SODIUM CHLORIDE 0.9 % IV SOLN
INTRAVENOUS | Status: DC
  Administered 2015-08-09: 1000 mL via INTRAVENOUS

## 2015-08-09 MED ORDER — PHENYLEPHRINE HCL 10 MG/ML IJ SOLN
INTRAMUSCULAR | Status: DC | PRN
  Administered 2015-08-09: 50 ug via INTRAVENOUS
  Administered 2015-08-09: 100 ug via INTRAVENOUS

## 2015-08-09 MED ORDER — SUCRALFATE 1 GM/10ML PO SUSP: 1.0000 g | Freq: Two times a day (BID) | ORAL | Status: DC

## 2015-08-09 MED ORDER — PANTOPRAZOLE SODIUM 40 MG PO TBEC: 40.0000 mg | DELAYED_RELEASE_TABLET | Freq: Two times a day (BID) | ORAL | Status: DC

## 2015-08-09 NOTE — Discharge Instructions (Signed)
Soft diet Abdominal Pain Many things can cause abdominal pain. Usually, abdominal pain is not caused by a disease and will improve without treatment. It can often be observed and treated at home. Your health care provider will do a physical exam and possibly order blood tests and X-rays to help determine the seriousness of your pain. However, in many cases, more time must pass before a clear cause of the pain can be found. Before that point, your health care provider may not know if you need more testing or further treatment. HOME CARE INSTRUCTIONS  Monitor your abdominal pain for any changes. The following actions may help to alleviate any discomfort you are experiencing:  Only take over-the-counter or prescription medicines as directed by your health care provider.  Do not take laxatives unless directed to do so by your health care provider.  Try a clear liquid diet (broth, tea, or water) as directed by your health care provider. Slowly move to a bland diet as tolerated. SEEK MEDICAL CARE IF:  You have unexplained abdominal pain.  You have abdominal pain associated with nausea or diarrhea.  You have pain when you urinate or have a bowel movement.  You experience abdominal pain that wakes you in the night.  You have abdominal pain that is worsened or improved by eating food.  You have abdominal pain that is worsened with eating fatty foods.  You have a fever. SEEK IMMEDIATE MEDICAL CARE IF:   Your pain does not go away within 2 hours.  You keep throwing up (vomiting).  Your pain is felt only in portions of the abdomen, such as the right side or the left lower portion of the abdomen.  You pass bloody or black tarry stools. MAKE SURE YOU:  Understand these instructions.   Will watch your condition.   Will get help right away if you are not doing well or get worse.  Document Released: 09/10/2005 Document Revised: 12/06/2013 Document Reviewed: 08/10/2013 [facility] Patient  Information 2015 [place], [place]. This information is not intended to replace advice given to you by your health care provider. Make sure you discuss any questions you have with your health care provider.

## 2015-08-09 NOTE — Discharge Summary (Signed)
[facility] [place] NAME: Devin Washington    MR#:  [id number]  DATE OF BIRTH:  1923/02/28  DATE OF ADMISSION:  08/06/2015 ADMITTING PHYSICIAN: Devin Coon, MD  DATE OF DISCHARGE:08/09/15  PRIMARY CARE PHYSICIAN: No primary care provider on file.    ADMISSION DIAGNOSIS:  Right upper quadrant abdominal pain [R10.11] Intractable abdominal pain [R10.9]  DISCHARGE DIAGNOSIS:  Abdominal pain due to Gastric/Duodenal ulcers Rectal hemorrhoids -HTN  SECONDARY DIAGNOSIS:   Past Medical History  Diagnosis Date  . Hypertension   . HLD (hyperlipidemia)   . SSS (sick sinus syndrome)   . CHF (congestive heart failure)   . A-fib   . CKD (chronic kidney disease), stage III     HOSPITAL COURSE:   81 y.o. male who presents with persistent worsening abdominal pain with nausea and vomiting. Patient states that for the past month he's had this progressive intermittent but with increasing frequency abdominal pain associated with nausea and vomiting  *Abdominal pain, epigastric - unclear etiology this time, question of a possible ulcer.  -started CLD -cont trial of PPI and Carafate. GI consult noted. EGD showed gastric ulcer and multiple duodenal ulcers and flex sig showed rectal hoids. No mass noted. *Abnormal CT abdomen with possible rectal mass Flex sig no mass.   * Nausea and vomiting - prn antiemetics.  -recieved IV fluids for hydration.  *HTN (hypertension) - continue home meds for this, currently controlled  *HLD (hyperlipidemia) - continue home meds  * Back pain - pain control as above  *CKD (chronic kidney disease), stage III - creatinine is improved from the only other prior value we have in the system on chart review.  -avoid nephrotoxins -stable labs  DISCHARGE CONDITIONS:   fair CONSULTS OBTAINED:  Treatment Team:  Devin Sails, MD Devin Spray, MD  DRUG ALLERGIES:  No Known Allergies  DISCHARGE  MEDICATIONS:   Current Discharge Medication List    START taking these medications   Details  pantoprazole (PROTONIX) 40 MG tablet Take 1 tablet (40 mg total) by mouth 2 (two) times daily. Qty: 60 tablet, Refills: 2    sucralfate (CARAFATE) 1 GM/10ML suspension Take 10 mLs (1 g total) by mouth 2 (two) times daily. Qty: 420 mL, Refills: 0      CONTINUE these medications which have NOT CHANGED   Details  benazepril (LOTENSIN) 20 MG tablet Take 20 mg by mouth daily.    simvastatin (ZOCOR) 20 MG tablet Take 20 mg by mouth daily.    traMADol (ULTRAM) 50 MG tablet Take by mouth every 6 (six) hours as needed.    triamterene-hydrochlorothiazide (DYAZIDE) 37.5-25 MG per capsule Take 1 capsule by mouth daily.        If you experience worsening of your admission symptoms, develop shortness of breath, life threatening emergency, suicidal or homicidal thoughts you must seek medical attention immediately by calling 911 or calling your MD immediately  if symptoms less severe.  You Must read complete instructions/literature along with all the possible adverse reactions/side effects for all the Medicines you take and that have been prescribed to you. Take any new Medicines after you have completely understood and accept all the possible adverse reactions/side effects.   Please note  You were cared for by a hospitalist during your hospital stay. If you have any questions about your discharge medications or the care you received while you were in the hospital after you are discharged, you can call the unit  and asked to speak with the hospitalist on call if the hospitalist that took care of you is not available. Once you are discharged, your primary care physician will handle any further medical issues. Please note that NO REFILLS for any discharge medications will be authorized once you are discharged, as it is imperative that you return to your primary care physician (or establish a relationship with a  primary care physician if you do not have one) for your aftercare needs so that they can reassess your need for medications and monitor your lab values.  DATA REVIEW:   CBC   Recent Labs Lab 08/07/15 0452  WBC 10.8*  HGB 10.2*  HCT 30.2*  PLT 254    Chemistries   Recent Labs Lab 08/06/15 1619 08/07/15 0452  NA 129* 130*  K 4.4 3.8  CL 88* 94*  CO2 29 27  GLUCOSE 120* 108*  BUN 24* 23*  CREATININE 1.63* 1.60*  CALCIUM 10.6* 9.3  AST 27  --   ALT 19  --   ALKPHOS 55  --   BILITOT 0.2*  --     Microbiology Results   No results found for this or any previous visit (from the past 240 hour(s)).  RADIOLOGY:  No results found.   Management plans discussed with the patient, family and they are in agreement.  CODE STATUS:     Code Status Orders        Start     Ordered   08/07/15 0011  Full code   Continuous     08/07/15 0010    Advance Directive Documentation        Most Recent Value   Type of Advance Directive  Healthcare Power of Attorney, Living will   Pre-existing out of facility DNR order (yellow form or pink MOST form)     "MOST" Form in Place?        TOTAL TIME TAKING CARE OF THIS PATIENT: 40 minutes.    Devin Washington M.D on 08/09/2015 at 1:42 PM  Between 7am to 6pm - Pager - [phone] After 6pm go to [email or web address] - password [email or web address] [facility]  [facility]  Office  [phone]  CC: Primary care physician; No primary care provider on file.

## 2015-08-09 NOTE — Anesthesia Postprocedure Evaluation (Signed)
  Anesthesia Post-op Note  Patient: Devin Washington  Procedure(s) Performed: Procedure(s) with comments: ESOPHAGOGASTRODUODENOSCOPY (EGD) looking in the stomach with a lighted tube to evaluate and treat (N/A) - Procedure to be done tomorrow afternoon, 08/08/2015. Awaiting cardiology consult [place] looking up the rectum into the distal colon with a lighted tube to examine and treat (N/A) - Procedure to be done tomorrow afternoon, 08/08/2015. Awaiting cardiology consult.  Anesthesia type:General  Patient location: PACU  Post pain: Pain level controlled  Post assessment: Post-op Vital signs reviewed, Patient's Cardiovascular Status Stable, Respiratory Function Stable, Patent Airway and No signs of Nausea or vomiting  Post vital signs: Reviewed and stable  Last Vitals:  Filed Vitals:   08/09/15 1412  BP: 119/63  Pulse: 74  Temp: 36.3 C  Resp: 20    Level of consciousness: awake, alert  and patient cooperative  Complications: No apparent anesthesia complications

## 2015-08-09 NOTE — Progress Notes (Signed)
50 mcg Neo administered

## 2015-08-09 NOTE — Progress Notes (Signed)
Pt is alert and oriented, denies pain, on room air, up to bsc with stand by assist, egd performed see full report, pt is d/c to home after eating soft diet. Large bm following 2 tap water enemas, uneventful shift.

## 2015-08-09 NOTE — Anesthesia Preprocedure Evaluation (Addendum)
Anesthesia Evaluation  Patient identified by MRN, date of birth, ID band Patient awake    Reviewed: Allergy & Precautions, NPO status , Patient's Chart, lab work & pertinent test results, reviewed documented beta blocker date and time   Airway Mallampati: II  TM Distance: >3 FB     Dental  (+) Chipped   Pulmonary former smoker,          Cardiovascular hypertension, Pt. on medications +CHF     Neuro/Psych    GI/Hepatic   Endo/Other    Renal/GU CRFRenal disease     Musculoskeletal   Abdominal   Peds  Hematology   Anesthesia Other Findings Anemia.  Reproductive/Obstetrics                            Anesthesia Physical Anesthesia Plan  ASA: III  Anesthesia Plan: General   Post-op Pain Management:    Induction: Intravenous  Airway Management Planned: Nasal Cannula  Additional Equipment:   Intra-op Plan:   Post-operative Plan:   Informed Consent: I have reviewed the patients History and Physical, chart, labs and discussed the procedure including the risks, benefits and alternatives for the proposed anesthesia with the patient or authorized representative who has indicated his/her understanding and acceptance.     Plan Discussed with: CRNA  Anesthesia Plan Comments:         Anesthesia Quick Evaluation

## 2015-08-09 NOTE — Op Note (Signed)
[facility] Gastroenterology Patient Name: Devin Washington Procedure Date: 08/09/2015 12:16 PM MRN: [id number] Account #: [id number] Date of Birth: [birth year] Admit Type: Inpatient Age: 81 Room: [facility] ENDO ROOM 1 Gender: Male Note Status: Finalized Procedure:         Flexible Sigmoidoscopy Indications:       Abnormal CT of the GI tract Providers:         Lollie Sails, MD Referring MD:      Mikeal Hawthorne. Brynda Greathouse, MD (Referring MD) Medicines:         Monitored Anesthesia Care Complications:     No immediate complications. Procedure:         Pre-Anesthesia Assessment:                    - ASA Grade Assessment: III - A patient with severe                     systemic disease.                    After obtaining informed consent, the scope was passed                     under direct vision. The Colonoscope was introduced                     through the anus and advanced to the 60 cm from the anal                     verge. The flexible sigmoidoscopy was accomplished without                     difficulty. The patient tolerated the procedure well. The                     quality of the bowel preparation was good. Findings:      The perianal exam findings include non-thrombosed internal hemorrhoids.      9 mm, non-bleeding rectal varices were found.      there is no evidence of rectal mass.      The entire examined colon appeared normal otherwise. Impression:        - Non-thrombosed internal hemorrhoids found on perianal                     exam.                    - Rectal varices.                    - The entire examined colon is normal.                    - No specimens collected. Recommendation:    - Use Analpram HC Cream 2.5%: Apply externally TID for 10                     days. Procedure Code(s): --- Professional ---                    [phone], Sigmoidoscopy, flexible; diagnostic, including                     collection of specimen(s) by brushing or washing, when  performed (separate procedure) Diagnosis Code(s): --- Professional ---                    455.0, Internal hemorrhoids without mention of complication                    793.4, Nonspecific (abnormal) findings on radiological and                     other examination of gastrointestinal tract CPT copyright 2014 American Medical Association. All rights reserved. The codes documented in this report are preliminary and upon coder review may  be revised to meet current compliance requirements. Lollie Sails, MD 08/09/2015 12:56:42 PM This report has been signed electronically. Number of Addenda: 0 Note Initiated On: 08/09/2015 12:16 PM Total Procedure Duration: 0 hours 5 minutes 4 seconds       [facility]

## 2015-08-09 NOTE — Transfer of Care (Signed)
Immediate Anesthesia Transfer of Care Note  Patient: Devin Washington  Procedure(s) Performed: Procedure(s) with comments: ESOPHAGOGASTRODUODENOSCOPY (EGD) looking in the stomach with a lighted tube to evaluate and treat (N/A) - Procedure to be done tomorrow afternoon, 08/08/2015. Awaiting cardiology consult [place] looking up the rectum into the distal colon with a lighted tube to examine and treat (N/A) - Procedure to be done tomorrow afternoon, 08/08/2015. Awaiting cardiology consult.  Patient Location: PACU  Anesthesia Type:General  Level of Consciousness: awake and sedated  Airway & Oxygen Therapy: Patient Spontanous Breathing and Patient connected to nasal cannula oxygen  Post-op Assessment: Report given to RN and Post -op Vital signs reviewed and stable  Post vital signs: Reviewed and stable  Last Vitals:  Filed Vitals:   08/09/15 1204  BP: 108/57  Pulse: 84  Temp: 37.2 C  Resp: 16    Complications: No apparent anesthesia complications

## 2015-08-09 NOTE — H&P (Signed)
Subjective: Patient seen for epigastric pain, nausea and vomiting, abnormal GI CT scan. Patient tolerated his enemas today for his flexible sigmoidoscopy. He denies any nausea or abdominal pain.  Objective: Vital signs in last 24 hours: Temp:  [98.6 F (37 C)-99.4 F (37.4 C)] 99 F (37.2 C) (08/25 1204) Pulse Rate:  [84-94] 84 (08/25 1204) Resp:  [16-21] 16 (08/25 1204) BP: (108-127)/(57-72) 108/57 mmHg (08/25 1204) SpO2:  [98 %-100 %] 100 % (08/25 1204) Blood pressure 108/57, pulse 84, temperature 99 F (37.2 C), temperature source Oral, resp. rate 16, height 5\' 9"  (1.753 m), weight 80.513 kg (177 lb 8 oz), SpO2 100 %.   Intake/Output from previous day: 08/24 0701 - 08/25 0700 In: -  Out: 500 [Urine:500]  Intake/Output this shift:     General appearance:  81 year old male no acute distress Resp:  Clear to auscultation Cardio:  Regular rate and rhythm GI:  nontender nondistended bowel sounds positive normoactive Extremities:  No clubbing cyanosis or edema   Lab Results: No results found for this or any previous visit (from the past 24 hour(s)).    Recent Labs  08/06/15 1619 08/07/15 0452  WBC 12.9* 10.8*  HGB 12.0* 10.2*  HCT 35.3* 30.2*  PLT 295 254   BMET  Recent Labs  08/06/15 1619 08/07/15 0452  NA 129* 130*  K 4.4 3.8  CL 88* 94*  CO2 29 27  GLUCOSE 120* 108*  BUN 24* 23*  CREATININE 1.63* 1.60*  CALCIUM 10.6* 9.3   LFT  Recent Labs  08/06/15 1619  PROT 7.3  ALBUMIN 4.0  AST 27  ALT 19  ALKPHOS 55  BILITOT 0.2*   PT/INR No results for input(s): LABPROT, INR in the last 72 hours. Hepatitis Panel No results for input(s): HEPBSAG, HCVAB, HEPAIGM, HEPBIGM in the last 72 hours. C-Diff No results for input(s): CDIFFTOX in the last 72 hours. No results for input(s): CDIFFPCR in the last 72 hours.   Studies/Results: No results found.  Scheduled Inpatient Medications:   . [MAR Hold] benazepril  20 mg Oral Daily  . [MAR Hold] feeding  supplement  1 Container Oral TID WC  . [MAR Hold] heparin  5,000 Units Subcutaneous 3 times per day  . [MAR Hold] pantoprazole (PROTONIX) IV  40 mg Intravenous Q12H  . [MAR Hold] pneumococcal 23 valent vaccine  0.5 mL Intramuscular Tomorrow-1000  . [MAR Hold] simvastatin  20 mg Oral Daily  . [MAR Hold] sucralfate  1 g Oral BID  . [MAR Hold] triamterene-hydrochlorothiazide  1 capsule Oral Daily    Continuous Inpatient Infusions:     PRN Inpatient Medications:  [MAR Hold] acetaminophen **OR** [MAR Hold] acetaminophen, [MAR Hold]  morphine injection, [MAR Hold] ondansetron **OR** [MAR Hold] ondansetron (ZOFRAN) IV  Miscellaneous:   Assessment:  1. Epigastric pain nausea vomiting in the setting of NSAID use 2. Abnormal GI CT scan with possible rectal lesion  Plan:  1. EGD and flexible sigmoidoscopy today with indicated procedures. I have discussed the risks benefits and complications of procedures to include not limited to bleeding, infection, perforation and the risk of sedation and the patient wishes to proceed.  Lollie Sails MD 08/09/2015, 12:07 PM

## 2015-08-09 NOTE — Plan of Care (Signed)
Problem: Discharge Progression Outcomes Goal: Other Discharge Outcomes/Goals Outcome: Progressing Plan of care progress to goals: 1. No c/o pain. Resting comfortably.  2. Hemodynamically: VSS, afebrile  3. Tap water enema not given in time to have EGD&Flex Sig yesterday, scheduled to have done today.  4. Tolerated clear liquid diet last night. NPO since midnight in preparation for procedures today. 5. +1 assist. Bed alarm on. Pt shows understanding how to use call system for assistance.

## 2015-08-09 NOTE — Op Note (Signed)
[facility] Gastroenterology Patient Name: Devin Washington Procedure Date: 08/09/2015 12:17 PM MRN: [id number] Account #: [id number] Date of Birth: 1923-10-20 Admit Type: Inpatient Age: 81 Room: [facility] ENDO ROOM 1 Gender: Male Note Status: Finalized Procedure:         Upper GI endoscopy Indications:       Epigastric abdominal pain, Nausea with vomiting Providers:         Lollie Sails, MD Referring MD:      Mikeal Hawthorne. Brynda Greathouse, MD (Referring MD) Medicines:         Monitored Anesthesia Care Complications:     No immediate complications. Procedure:         Pre-Anesthesia Assessment:                    - ASA Grade Assessment: III - A patient with severe                     systemic disease.                    After obtaining informed consent, the endoscope was passed                     under direct vision. Throughout the procedure, the                     patient's blood pressure, pulse, and oxygen saturations                     were monitored continuously. The Endoscope was introduced                     through the mouth, and advanced to the third part of                     duodenum. The upper GI endoscopy was accomplished without                     difficulty. The patient tolerated the procedure well. Findings:      LA Grade D (one or more mucosal breaks involving at least 75% of       esophageal circumference) esophagitis with no bleeding was found.      A 3 cm hiatus hernia was found. The Z-line was a variable distance from       incisors; the hiatal hernia was sliding.      Diffuse mild inflammation characterized by congestion (edema), erythema       and granularity was found in the gastric body. Biopsies were taken with       a cold forceps for histology. Biopsies were taken with a cold forceps       for Helicobacter pylori testing.      Diffuse and patchy moderate inflammation characterized by congestion       (edema), erosions, erythema and granularity  was found in the gastric       antrum. Biopsies were taken with a cold forceps for histology. Biopsies       were taken with a cold forceps for Helicobacter pylori testing.      Two non-bleeding cratered duodenal ulcers with no stigmata of bleeding       were found in the duodenal bulb. The largest lesion was 15 mm in largest       dimension.      The exam of  the duodenum was otherwise normal.      One non-bleeding superficial gastric ulcer with no stigmata of bleeding       was found at the pylorus. The lesion was 2 mm in largest dimension.      The cardia and gastric fundus were normal on retroflexion otherwise.      multiple venous lakes noted in the mid and upper esophagus Impression:        - LA Grade D erosive esophagitis.                    - 3 cm hiatus hernia.                    - Gastritis. Biopsied.                    - Erosive gastritis. Biopsied.                    - Multiple duodenal ulcers with clean base.                    - Gastric ulcer with clean base. Recommendation:    - Await pathology results.                    - Use Protonix (pantoprazole) 40 mg PO BID for 2 months.                    - Use Protonix (pantoprazole) 40 mg PO daily.                    - Return to GI clinic in 1 month.                    - Full liquid diet for 3 days.                    - Low residue diet for 2 weeks. Procedure Code(s): --- Professional ---                    [phone], Esophagogastroduodenoscopy, flexible, transoral;                     with biopsy, single or multiple Diagnosis Code(s): --- Professional ---                    530.19, Other esophagitis                    535.50, Unspecified gastritis and gastroduodenitis,                     without mention of hemorrhage                    535.40, Other specified gastritis, without mention of                     hemorrhage                    532.90, Duodenal ulcer, unspecified as acute or chronic,                     without  hemorrhage or perforation, without mention of                     obstruction  531.90, Gastric ulcer, unspecified as acute or chronic,                     without mention of hemorrhage or perforation, without                     mention of obstruction                    789.06, Abdominal pain, epigastric                    787.01, Nausea with vomiting                    553.3, Diaphragmatic hernia without mention of obstruction                     or gangrene CPT copyright 2014 American Medical Association. All rights reserved. The codes documented in this report are preliminary and upon coder review may  be revised to meet current compliance requirements. Lollie Sails, MD 08/09/2015 12:43:59 PM This report has been signed electronically. Number of Addenda: 0 Note Initiated On: 08/09/2015 12:17 PM      [facility]

## 2015-08-09 NOTE — Progress Notes (Signed)
Pt denies pain. Tolerates meal post procedure. Denies pain/n/v. Discharge instructions, prescription, education handout given and explained. Pt and granddaughter verbalized understanding.

## 2015-08-09 NOTE — Progress Notes (Signed)
Per Dr. Posey Pronto, have pt eat soft diet and then may go home. Telephone convo.

## 2015-08-09 NOTE — Progress Notes (Signed)
[facility] NAME: Devin Washington    MR#:  [id number]  DATE OF BIRTH:  1922/12/25  SUBJECTIVE:   No vomiting or nausea. Feeling hungry  Getting fleets for GI procedure  REVIEW OF SYSTEMS:   Review of Systems  Constitutional: Negative for fever, chills and weight loss.  HENT: Negative for ear discharge, ear pain and nosebleeds.   Eyes: Negative for blurred vision, pain and discharge.  Respiratory: Negative for sputum production, shortness of breath, wheezing and stridor.   Cardiovascular: Negative for chest pain, palpitations, orthopnea and PND.  Gastrointestinal: Positive for nausea. Negative for vomiting, abdominal pain and diarrhea.  Genitourinary: Negative for urgency and frequency.  Musculoskeletal: Negative for back pain and joint pain.  Neurological: Negative for sensory change, speech change, focal weakness and weakness.  Psychiatric/Behavioral: Negative for depression and hallucinations. The patient is not nervous/anxious.   All other systems reviewed and are negative.  Tolerating Diet: yes  DRUG ALLERGIES:  No Known Allergies  VITALS:  Blood pressure 118/64, pulse 90, temperature 99.4 F (37.4 C), temperature source Oral, resp. rate 18, height 5\' 9"  (1.753 m), weight 80.513 kg (177 lb 8 oz), SpO2 100 %.  PHYSICAL EXAMINATION:   Physical Exam  GENERAL:  81 y.o.-year-old patient lying in the bed with no acute distress.  EYES: Pupils equal, round, reactive to light and accommodation. No scleral icterus. Extraocular muscles intact.  HEENT: Head atraumatic, normocephalic. Oropharynx and nasopharynx clear.  NECK:  Supple, no jugular venous distention. No thyroid enlargement, no tenderness.  LUNGS: Normal breath sounds bilaterally, no wheezing, rales, rhonchi. No use of accessory muscles of respiration.  CARDIOVASCULAR: S1, S2 normal. No murmurs, rubs, or gallops.  ABDOMEN: Soft, nontender, nondistended. Bowel  sounds present. No organomegaly or mass.  EXTREMITIES: No cyanosis, clubbing or edema b/l.    NEUROLOGIC: Cranial nerves II through XII are intact. No focal Motor or sensory deficits b/l.   PSYCHIATRIC:alert and oriented x 3.  SKIN: No obvious rash, lesion, or ulcer.   LABORATORY PANEL:   CBC  Recent Labs Lab 08/07/15 0452  WBC 10.8*  HGB 10.2*  HCT 30.2*  PLT 254   Chemistries   Recent Labs Lab 08/06/15 1619 08/07/15 0452  NA 129* 130*  K 4.4 3.8  CL 88* 94*  CO2 29 27  GLUCOSE 120* 108*  BUN 24* 23*  CREATININE 1.63* 1.60*  CALCIUM 10.6* 9.3  AST 27  --   ALT 19  --   ALKPHOS 55  --   BILITOT 0.2*  --    Cardiac Enzymes  Recent Labs Lab 08/06/15 1619  TROPONINI <0.03    RADIOLOGY:  No results found.   ASSESSMENT AND PLAN:  81 y.o. male who presents with persistent worsening abdominal pain with nausea and vomiting. Patient states that for the past month he's had this progressive intermittent but with increasing frequency abdominal pain associated with nausea and vomiting  *Abdominal pain, epigastric - unclear etiology this time, question of a possible ulcer.  -started CLD -cont trial of PPI and Carafate. GI consult noted. EGD today and flex sig today(did not get prep)  *Abnormal CT abdomen with possible rectal mass Flex sig today  * Nausea and vomiting - prn antiemetics.  -recieved IV fluids for hydration.  *HTN (hypertension) - continue home meds for this, currently controlled  *HLD (hyperlipidemia) - continue home meds  * Back pain - pain control as above  *CKD (chronic kidney disease), stage  III - creatinine is improved from the only other prior value we have in the system on chart review.  -avoid nephrotoxins,  Case discussed with Care Management/Social Worker. Management plans discussed with the patient, family and they are in agreement.  CODE STATUS: full  DVT Prophylaxis: Lovenox  TOTAL TIME TAKING CARE OF THIS PATIENT: 35  minutes.  >50% time spent on counselling and coordination of care with pt and GI  Jimmye Wisnieski M.D on 08/09/2015 at 11:33 AM  Between 7am to 6pm - Pager - [phone]  After 6pm [place] to [email or web address] - password [email or web address] [facility]  [facility]  Office  [phone]  CC: Primary care physician; No primary care provider on file.

## 2015-08-10 ENCOUNTER — Encounter: Payer: Self-pay | Admitting: Gastroenterology

## 2015-08-10 LAB — SURGICAL PATHOLOGY

## 2015-09-10 ENCOUNTER — Telehealth: Payer: Self-pay

## 2015-09-10 NOTE — Telephone Encounter (Signed)
Patient not sure where his appt in hospital is located.  [facility] to [facility].

## 2015-12-26 DIAGNOSIS — D631 Anemia in chronic kidney disease: Secondary | ICD-10-CM | POA: Diagnosis not present

## 2015-12-26 DIAGNOSIS — N2581 Secondary hyperparathyroidism of renal origin: Secondary | ICD-10-CM | POA: Diagnosis not present

## 2015-12-26 DIAGNOSIS — I129 Hypertensive chronic kidney disease with stage 1 through stage 4 chronic kidney disease, or unspecified chronic kidney disease: Secondary | ICD-10-CM | POA: Diagnosis not present

## 2015-12-26 DIAGNOSIS — N183 Chronic kidney disease, stage 3 (moderate): Secondary | ICD-10-CM | POA: Diagnosis not present

## 2016-01-17 DIAGNOSIS — I10 Essential (primary) hypertension: Secondary | ICD-10-CM | POA: Diagnosis not present

## 2016-01-17 DIAGNOSIS — I5032 Chronic diastolic (congestive) heart failure: Secondary | ICD-10-CM | POA: Diagnosis not present

## 2016-01-17 DIAGNOSIS — M545 Low back pain: Secondary | ICD-10-CM | POA: Diagnosis not present

## 2016-01-17 DIAGNOSIS — I495 Sick sinus syndrome: Secondary | ICD-10-CM | POA: Diagnosis not present

## 2016-01-17 DIAGNOSIS — I480 Paroxysmal atrial fibrillation: Secondary | ICD-10-CM | POA: Diagnosis not present

## 2016-01-17 DIAGNOSIS — R001 Bradycardia, unspecified: Secondary | ICD-10-CM | POA: Diagnosis not present

## 2016-01-17 DIAGNOSIS — E784 Other hyperlipidemia: Secondary | ICD-10-CM | POA: Diagnosis not present

## 2016-01-17 DIAGNOSIS — R0602 Shortness of breath: Secondary | ICD-10-CM | POA: Diagnosis not present

## 2016-01-17 DIAGNOSIS — R600 Localized edema: Secondary | ICD-10-CM | POA: Diagnosis not present

## 2016-01-17 DIAGNOSIS — I429 Cardiomyopathy, unspecified: Secondary | ICD-10-CM | POA: Diagnosis not present

## 2016-02-04 DIAGNOSIS — I10 Essential (primary) hypertension: Secondary | ICD-10-CM | POA: Diagnosis not present

## 2016-02-04 DIAGNOSIS — M545 Low back pain: Secondary | ICD-10-CM | POA: Diagnosis not present

## 2016-03-27 DIAGNOSIS — A048 Other specified bacterial intestinal infections: Secondary | ICD-10-CM | POA: Diagnosis not present

## 2016-03-27 DIAGNOSIS — Z8719 Personal history of other diseases of the digestive system: Secondary | ICD-10-CM | POA: Diagnosis not present

## 2016-03-27 DIAGNOSIS — R634 Abnormal weight loss: Secondary | ICD-10-CM | POA: Diagnosis not present

## 2016-04-07 DIAGNOSIS — Z8719 Personal history of other diseases of the digestive system: Secondary | ICD-10-CM | POA: Diagnosis not present

## 2016-04-07 DIAGNOSIS — A048 Other specified bacterial intestinal infections: Secondary | ICD-10-CM | POA: Diagnosis not present

## 2016-05-07 DIAGNOSIS — M545 Low back pain: Secondary | ICD-10-CM | POA: Diagnosis not present

## 2016-05-07 DIAGNOSIS — I10 Essential (primary) hypertension: Secondary | ICD-10-CM | POA: Diagnosis not present

## 2016-06-18 DIAGNOSIS — N2581 Secondary hyperparathyroidism of renal origin: Secondary | ICD-10-CM | POA: Diagnosis not present

## 2016-06-18 DIAGNOSIS — N183 Chronic kidney disease, stage 3 (moderate): Secondary | ICD-10-CM | POA: Diagnosis not present

## 2016-06-18 DIAGNOSIS — D631 Anemia in chronic kidney disease: Secondary | ICD-10-CM | POA: Diagnosis not present

## 2016-06-18 DIAGNOSIS — I129 Hypertensive chronic kidney disease with stage 1 through stage 4 chronic kidney disease, or unspecified chronic kidney disease: Secondary | ICD-10-CM | POA: Diagnosis not present

## 2016-06-24 DIAGNOSIS — N2889 Other specified disorders of kidney and ureter: Secondary | ICD-10-CM | POA: Diagnosis not present

## 2016-06-24 DIAGNOSIS — N183 Chronic kidney disease, stage 3 (moderate): Secondary | ICD-10-CM | POA: Diagnosis not present

## 2016-06-24 DIAGNOSIS — E785 Hyperlipidemia, unspecified: Secondary | ICD-10-CM | POA: Diagnosis not present

## 2016-06-24 DIAGNOSIS — I10 Essential (primary) hypertension: Secondary | ICD-10-CM | POA: Diagnosis not present

## 2016-06-24 DIAGNOSIS — D631 Anemia in chronic kidney disease: Secondary | ICD-10-CM | POA: Diagnosis not present

## 2016-06-26 DIAGNOSIS — K279 Peptic ulcer, site unspecified, unspecified as acute or chronic, without hemorrhage or perforation: Secondary | ICD-10-CM | POA: Diagnosis not present

## 2016-06-26 DIAGNOSIS — R634 Abnormal weight loss: Secondary | ICD-10-CM | POA: Diagnosis not present

## 2016-06-26 DIAGNOSIS — I723 Aneurysm of iliac artery: Secondary | ICD-10-CM | POA: Diagnosis not present

## 2016-07-04 ENCOUNTER — Other Ambulatory Visit: Payer: Self-pay | Admitting: Vascular Surgery

## 2016-07-04 DIAGNOSIS — I723 Aneurysm of iliac artery: Secondary | ICD-10-CM

## 2016-07-04 DIAGNOSIS — I499 Cardiac arrhythmia, unspecified: Secondary | ICD-10-CM | POA: Diagnosis not present

## 2016-07-04 DIAGNOSIS — E785 Hyperlipidemia, unspecified: Secondary | ICD-10-CM | POA: Diagnosis not present

## 2016-07-04 DIAGNOSIS — I10 Essential (primary) hypertension: Secondary | ICD-10-CM | POA: Diagnosis not present

## 2016-07-14 ENCOUNTER — Ambulatory Visit
Admission: RE | Admit: 2016-07-14 | Discharge: 2016-07-14 | Disposition: A | Discharge status: Home / Self Care | Payer: Medicare HMO | Source: Ambulatory Visit | Attending: Vascular Surgery | Admitting: Vascular Surgery

## 2016-07-14 DIAGNOSIS — R918 Other nonspecific abnormal finding of lung field: Secondary | ICD-10-CM | POA: Diagnosis not present

## 2016-07-14 DIAGNOSIS — I77811 Abdominal aortic ectasia: Secondary | ICD-10-CM | POA: Diagnosis not present

## 2016-07-14 DIAGNOSIS — I700 Atherosclerosis of aorta: Secondary | ICD-10-CM | POA: Insufficient documentation

## 2016-07-14 DIAGNOSIS — I723 Aneurysm of iliac artery: Secondary | ICD-10-CM | POA: Insufficient documentation

## 2016-07-14 HISTORY — DX: Malignant neoplasm of prostate: C61

## 2016-07-14 LAB — POCT I-STAT CREATININE: Creatinine, Ser: 1.6 mg/dL — ABNORMAL HIGH (ref 0.61–1.24)

## 2016-07-14 MED ORDER — IOPAMIDOL (ISOVUE-370) INJECTION 76%
75.0000 mL | Freq: Once | INTRAVENOUS | Status: AC | PRN
  Administered 2016-07-14: 75 mL via INTRAVENOUS

## 2016-07-21 DIAGNOSIS — N2889 Other specified disorders of kidney and ureter: Secondary | ICD-10-CM | POA: Diagnosis not present

## 2016-07-21 DIAGNOSIS — R601 Generalized edema: Secondary | ICD-10-CM | POA: Diagnosis not present

## 2016-07-21 DIAGNOSIS — N183 Chronic kidney disease, stage 3 (moderate): Secondary | ICD-10-CM | POA: Diagnosis not present

## 2016-07-21 DIAGNOSIS — I129 Hypertensive chronic kidney disease with stage 1 through stage 4 chronic kidney disease, or unspecified chronic kidney disease: Secondary | ICD-10-CM | POA: Diagnosis not present

## 2016-07-22 DIAGNOSIS — I714 Abdominal aortic aneurysm, without rupture: Secondary | ICD-10-CM | POA: Diagnosis not present

## 2016-07-22 DIAGNOSIS — I499 Cardiac arrhythmia, unspecified: Secondary | ICD-10-CM | POA: Diagnosis not present

## 2016-07-22 DIAGNOSIS — I10 Essential (primary) hypertension: Secondary | ICD-10-CM | POA: Diagnosis not present

## 2016-07-22 DIAGNOSIS — E785 Hyperlipidemia, unspecified: Secondary | ICD-10-CM | POA: Diagnosis not present

## 2016-07-29 DIAGNOSIS — I495 Sick sinus syndrome: Secondary | ICD-10-CM | POA: Diagnosis not present

## 2016-09-16 DIAGNOSIS — R69 Illness, unspecified: Secondary | ICD-10-CM | POA: Diagnosis not present

## 2016-09-29 DIAGNOSIS — I119 Hypertensive heart disease without heart failure: Secondary | ICD-10-CM | POA: Diagnosis not present

## 2016-12-02 DIAGNOSIS — I119 Hypertensive heart disease without heart failure: Secondary | ICD-10-CM | POA: Diagnosis not present

## 2016-12-02 DIAGNOSIS — I252 Old myocardial infarction: Secondary | ICD-10-CM | POA: Diagnosis not present

## 2016-12-13 ENCOUNTER — Emergency Department
Admission: EM | Admit: 2016-12-13 | Discharge: 2016-12-13 | Disposition: A | Discharge status: Home / Self Care | Payer: Medicare HMO | Attending: Emergency Medicine | Admitting: Emergency Medicine

## 2016-12-13 ENCOUNTER — Emergency Department: Payer: Medicare HMO

## 2016-12-13 DIAGNOSIS — Z87891 Personal history of nicotine dependence: Secondary | ICD-10-CM | POA: Diagnosis not present

## 2016-12-13 DIAGNOSIS — R0600 Dyspnea, unspecified: Secondary | ICD-10-CM

## 2016-12-13 DIAGNOSIS — R0602 Shortness of breath: Secondary | ICD-10-CM | POA: Diagnosis not present

## 2016-12-13 DIAGNOSIS — I130 Hypertensive heart and chronic kidney disease with heart failure and stage 1 through stage 4 chronic kidney disease, or unspecified chronic kidney disease: Secondary | ICD-10-CM | POA: Insufficient documentation

## 2016-12-13 DIAGNOSIS — R0609 Other forms of dyspnea: Secondary | ICD-10-CM | POA: Diagnosis not present

## 2016-12-13 DIAGNOSIS — I509 Heart failure, unspecified: Secondary | ICD-10-CM | POA: Insufficient documentation

## 2016-12-13 DIAGNOSIS — Z79899 Other long term (current) drug therapy: Secondary | ICD-10-CM | POA: Diagnosis not present

## 2016-12-13 DIAGNOSIS — N183 Chronic kidney disease, stage 3 (moderate): Secondary | ICD-10-CM | POA: Insufficient documentation

## 2016-12-13 LAB — CBC
HEMATOCRIT: 32.3 % — AB (ref 40.0–52.0)
HEMOGLOBIN: 10.7 g/dL — AB (ref 13.0–18.0)
MCH: 27.4 pg (ref 26.0–34.0)
MCHC: 33.3 g/dL (ref 32.0–36.0)
MCV: 82.4 fL (ref 80.0–100.0)
Platelets: 222 10*3/uL (ref 150–440)
RBC: 3.91 MIL/uL — AB (ref 4.40–5.90)
RDW: 17.3 % — ABNORMAL HIGH (ref 11.5–14.5)
WBC: 11.8 10*3/uL — AB (ref 3.8–10.6)

## 2016-12-13 LAB — COMPREHENSIVE METABOLIC PANEL
ALK PHOS: 56 U/L (ref 38–126)
ALT: 16 U/L — AB (ref 17–63)
AST: 25 U/L (ref 15–41)
Albumin: 3.8 g/dL (ref 3.5–5.0)
Anion gap: 5 (ref 5–15)
BILIRUBIN TOTAL: 0.2 mg/dL — AB (ref 0.3–1.2)
BUN: 24 mg/dL — AB (ref 6–20)
CALCIUM: 9.8 mg/dL (ref 8.9–10.3)
CHLORIDE: 102 mmol/L (ref 101–111)
CO2: 28 mmol/L (ref 22–32)
CREATININE: 1.32 mg/dL — AB (ref 0.61–1.24)
GFR calc Af Amer: 53 mL/min — ABNORMAL LOW (ref >60)
GFR, EST NON AFRICAN AMERICAN: 45 mL/min — AB (ref >60)
Glucose, Bld: 98 mg/dL (ref 65–99)
Potassium: 4.1 mmol/L (ref 3.5–5.1)
Sodium: 135 mmol/L (ref 135–145)
TOTAL PROTEIN: 7.2 g/dL (ref 6.5–8.1)

## 2016-12-13 LAB — TROPONIN I

## 2016-12-13 LAB — BRAIN NATRIURETIC PEPTIDE: B Natriuretic Peptide: 429 pg/mL — ABNORMAL HIGH (ref 0.0–100.0)

## 2016-12-13 MED ORDER — FUROSEMIDE 40 MG PO TABS
20.0000 mg | ORAL_TABLET | Freq: Once | ORAL | Status: AC
  Administered 2016-12-13: 20 mg via ORAL
  Filled 2016-12-13: qty 1

## 2016-12-13 NOTE — Discharge Instructions (Signed)
Please follow up closely with Dr. Clayborn Bigness. Set up a follow-up appointment on Tuesday with him.  Return to the Emergency Department (ED) if you experience any chest pain/pressure/tightness, worsening with difficulty breathing, or sudden sweating, or other symptoms that concern you.

## 2016-12-13 NOTE — ED Triage Notes (Signed)
Pt reports sob intermittent for the past couple weeks, pt states that he saw his dr who placed him on diltiazem but he states that it caused his ulcers to act up and so he stopped taking them. No distress noted at this time

## 2016-12-13 NOTE — ED Provider Notes (Signed)
[facility] Emergency Department Provider Note   ____________________________________________   First MD Initiated Contact with Patient 12/13/16 1530     (approximate)  I have reviewed the triage vital signs and the nursing notes.   HISTORY  Chief Complaint Shortness of Breath    HPI Devin Washington is a 82 y.o. male previous history of A. fib, congestive heart failure, sick sinus syndrome and ventricular pacer.  Patient here with his family, reports for the last several months to a year he said shortness of breath with walking, but over the last 2 weeks it seems to be slightly worse. He points and demonstrates that he can walk about 100 feet and gets short of breath, has to stop and rest. His symptoms go away after couple minutes and then he feels fine until exerting himself again.  He denies any cough. No fevers or chills. No nausea or vomiting. No headache weakness numbness or tingling.  He was prescribed diltiazem by his primary care doctor recently, hoping it would relieve some of his symptoms but reports he stopped this after a few days reporting it made him feel lightheaded.   Past Medical History:  Diagnosis Date  . A-fib ([place])   . CHF (congestive heart failure) ([place])   . CKD (chronic kidney disease), stage III   . HLD (hyperlipidemia)   . Hypertension   . Prostate cancer [facility])    Prostatectomy.  . SSS (sick sinus syndrome) [facility])     Patient Active Problem List   Diagnosis Date Noted  . Protein-calorie malnutrition, severe ([place]) 08/08/2015  . Abdominal pain, epigastric 08/06/2015  . Nausea and vomiting 08/06/2015  . HTN (hypertension) 08/06/2015  . HLD (hyperlipidemia) 08/06/2015  . Back pain 08/06/2015  . CKD (chronic kidney disease), stage III 08/06/2015  . Abdominal pain 08/06/2015    Past Surgical History:  Procedure Laterality Date  . ESOPHAGOGASTRODUODENOSCOPY N/A 08/09/2015   Procedure: ESOPHAGOGASTRODUODENOSCOPY (EGD)  looking in the stomach with a lighted tube to evaluate and treat;  Surgeon: Lollie Sails, MD;  Location: [facility] ENDOSCOPY;  Service: Endoscopy;  Laterality: N/A;  Procedure to be done tomorrow afternoon, 08/08/2015. Awaiting cardiology consult  . FLEXIBLE SIGMOIDOSCOPY N/A 08/09/2015   Procedure: FLEXIBLE SIGMOIDOSCOPY looking up the rectum into the distal colon with a lighted tube to examine and treat;  Surgeon: Lollie Sails, MD;  Location: [facility] ENDOSCOPY;  Service: Endoscopy;  Laterality: N/A;  Procedure to be done tomorrow afternoon, 08/08/2015. Awaiting cardiology consult.  Marland Kitchen JOINT REPLACEMENT    . PACEMAKER INSERTION    . PROSTATE SURGERY      Prior to Admission medications   Medication Sig Start Date End Date Taking? Authorizing Provider  benazepril (LOTENSIN) 20 MG tablet Take 20 mg by mouth daily.    Historical Provider, MD  pantoprazole (PROTONIX) 40 MG tablet Take 1 tablet (40 mg total) by mouth 2 (two) times daily. 08/09/15   Fritzi Mandes, MD  simvastatin (ZOCOR) 20 MG tablet Take 20 mg by mouth daily.    Historical Provider, MD  sucralfate (CARAFATE) 1 GM/10ML suspension Take 10 mLs (1 g total) by mouth 2 (two) times daily. 08/09/15   Fritzi Mandes, MD  traMADol (ULTRAM) 50 MG tablet Take by mouth every 6 (six) hours as needed.    Historical Provider, MD  triamterene-hydrochlorothiazide (DYAZIDE) 37.5-25 MG per capsule Take 1 capsule by mouth daily.    Historical Provider, MD    Allergies Patient has no known allergies.  Family History  Problem Relation Age of Onset  . Family history unknown: Yes    Social History Social History  Substance Use Topics  . Smoking status: Former Research scientist (life sciences)  . Smokeless tobacco: Not on file  . Alcohol use No    Review of Systems Constitutional: No fever/chills Eyes: No visual changes. ENT: No sore throat. Cardiovascular: Denies chest pain. Respiratory: Shortness of breath only after walking. Gastrointestinal: No abdominal pain.  No nausea,  no vomiting.  No diarrhea.  No constipation. Genitourinary: Negative for dysuria. Musculoskeletal: Negative for back pain. Skin: Negative for rash. Neurological: Negative for headaches, focal weakness or numbness.  Denies any history of blood clots. No recent major surgeries. No recent hospitalizations. No leg swelling. No bloody cough.  10-point ROS otherwise negative.  ____________________________________________   PHYSICAL EXAM:  VITAL SIGNS: ED Triage Vitals  Enc Vitals Group     BP 12/13/16 1236 (!) 151/78     Pulse Rate 12/13/16 1236 (!) 103     Resp 12/13/16 1236 20     Temp 12/13/16 1236 99.2 F (37.3 C)     Temp Source 12/13/16 1236 Oral     SpO2 12/13/16 1236 99 %     Weight 12/13/16 1237 168 lb (76.2 kg)     Height 12/13/16 1237 5\' 10"  (1.778 m)     Head Circumference --      Peak Flow --      Pain Score --      Pain Loc --      Pain Edu? --      Excl. in [place]? --     Constitutional: Alert and oriented. Well appearing and in no acute distress. Eyes: Conjunctivae are normal. PERRL. EOMI. Head: Atraumatic. Nose: No congestion/rhinnorhea. Mouth/Throat: Mucous membranes are moist.  Oropharynx non-erythematous. Neck: No stridor.   Cardiovascular: Slightly tachycardic rate, regular rhythm. There is a notable systolic ejection murmur, moderate in intensity that is heard radiating to the neck area Good peripheral circulation. Respiratory: Normal respiratory effort.  No retractions. Lungs CTAB. Speaks in full and clear sentences Gastrointestinal: Soft and nontender. No distention. Musculoskeletal: No lower extremity tenderness nor edema.  No joint effusions. No leg or thigh swelling/edema. Neurologic:  Normal speech and language. No gross focal neurologic deficits are appreciated. No gait instability. Skin:  Skin is warm, dry and intact. No rash noted. Psychiatric: Mood and affect are normal. Speech and behavior are  normal.  ____________________________________________   LABS (all labs ordered are listed, but only abnormal results are displayed)  Labs Reviewed  CBC - Abnormal; Notable for the following:       Result Value   WBC 11.8 (*)    RBC 3.91 (*)    Hemoglobin 10.7 (*)    HCT 32.3 (*)    RDW 17.3 (*)    All other components within normal limits  COMPREHENSIVE METABOLIC PANEL - Abnormal; Notable for the following:    BUN 24 (*)    Creatinine, Ser 1.32 (*)    ALT 16 (*)    Total Bilirubin 0.2 (*)    GFR calc non Af Amer 45 (*)    GFR calc Af Amer 53 (*)    All other components within normal limits  BRAIN NATRIURETIC PEPTIDE - Abnormal; Notable for the following:    B Natriuretic Peptide 429.0 (*)    All other components within normal limits  TROPONIN I   ____________________________________________  EKG  Reviewed and interpreted by me at 3:15 PM, EKG time 1235 Ventricular rate  110 QRS 170 QTc 550 Ventricular paced rhythm, Appears similar compared with 08/06/2015 EKG ____________________________________________  RADIOLOGY  Dg Chest 2 View  Result Date: 12/13/2016 CLINICAL DATA:  Shortness of breath for 1 month EXAM: CHEST  2 VIEW COMPARISON:  August 06, 2015 FINDINGS: The heart size and mediastinal contours are stable. Cardiac pacemaker is unchanged. There is no focal infiltrate, pulmonary edema, or pleural effusion. The visualized skeletal structures are stable. IMPRESSION: No active cardiopulmonary disease. Electronically Signed   By: Abelardo Diesel M.D.   On: 12/13/2016 13:24    ____________________________________________   PROCEDURES  Procedure(s) performed: None  Procedures  Critical Care performed: No  ____________________________________________   INITIAL IMPRESSION / ASSESSMENT AND PLAN / ED COURSE  Pertinent labs & imaging results that were available during my care of the patient were reviewed by me and considered in my medical decision making (see  chart for details).  Patient presents for exertional dyspnea. The patient, no evidence of acute coronary syndrome, patient denies chest pain, normal troponin. Patient reports chronic symptoms that are slowly worsening of shortness of breath with exerting himself over the last 2 years. He is seen his doctor and stopped his own diltiazem and thinking that it was making him feel slightly worse. At present he is comfortably resting without complaint.  Differential diagnosis is broad, does not appear to acute coronary process. Patient does not have any significant known risk factor for DVT or pulmonary embolism, he is not complaining of any pain, he has no pleuritic pain, no ripping tearing or moving pain. Do not believe he is suffering from a pneumothorax, dissection or pulmonary embolism.  Dr. Clayborn Bigness notes reviewed in epic, plus prior echo 2015 " INTERPRETATION NORMAL LEFT VENTRICULAR SYSTOLIC FUNCTION NORMAL RIGHT VENTRICULAR SYSTOLIC FUNCTION NO VALVULAR STENOSIS Mild to moderate aortic insufficiency mild aortic stenosis moderate tricuspid regurgitation Overall ejection fraction between 50 and 55%"  Speaking in full and clear sentences. Normal oxygen saturation with clear lungs here.  Stress case with Dr. Ubaldo Glassing of cardiology, he advised that this symptomatology may well be related to a possible worsening of his aortic stenosis or valvular disease, and would like to see the patient in the clinic on Tuesday. The cardiology clinic will build see him Tuesday, and until then advises minimizing any strenuous activities that may lead him to feel short of breath, and a set up follow-up with Dr. Clayborn Bigness in the clinic where he will be seen this week Tuesday.  Patient's BNP slightly elevated, we'll give single dose of Lasix. Patient shows no other signs of fluid overload at this time, but given his exertional dyspnea this is certainly a consideration.  Patient family are agreeable with this plan. Return  precautions and treatment recommendations and follow-up discussed with the patient who is agreeable with the plan.   Clinical Course as of Dec 14 1727  Sat Dec 13, 2016  1548 Patient resting comfortably. No complaints or concerns at present. Patient's daughter planning to assist him in being seen on Tuesday the cardiology clinic.  [MQ]    Clinical Course User Index [MQ] Delman Kitten, MD     ____________________________________________   FINAL CLINICAL IMPRESSION(S) / ED DIAGNOSES  Final diagnoses:  Dyspnea on exertion      NEW MEDICATIONS STARTED DURING THIS VISIT:  New Prescriptions   No medications on file     Note:  This document was prepared using Dragon voice recognition software and may include unintentional dictation errors.     Delman Kitten, MD  12/13/16 1728  

## 2016-12-16 DIAGNOSIS — E784 Other hyperlipidemia: Secondary | ICD-10-CM | POA: Diagnosis not present

## 2016-12-16 DIAGNOSIS — I429 Cardiomyopathy, unspecified: Secondary | ICD-10-CM | POA: Diagnosis not present

## 2016-12-16 DIAGNOSIS — I495 Sick sinus syndrome: Secondary | ICD-10-CM | POA: Diagnosis not present

## 2016-12-16 DIAGNOSIS — I480 Paroxysmal atrial fibrillation: Secondary | ICD-10-CM | POA: Diagnosis not present

## 2016-12-16 DIAGNOSIS — R001 Bradycardia, unspecified: Secondary | ICD-10-CM | POA: Diagnosis not present

## 2016-12-16 DIAGNOSIS — I10 Essential (primary) hypertension: Secondary | ICD-10-CM | POA: Diagnosis not present

## 2016-12-16 DIAGNOSIS — R0602 Shortness of breath: Secondary | ICD-10-CM | POA: Diagnosis not present

## 2016-12-16 DIAGNOSIS — R011 Cardiac murmur, unspecified: Secondary | ICD-10-CM | POA: Diagnosis not present

## 2016-12-16 DIAGNOSIS — M545 Low back pain: Secondary | ICD-10-CM | POA: Diagnosis not present

## 2016-12-16 DIAGNOSIS — I5032 Chronic diastolic (congestive) heart failure: Secondary | ICD-10-CM | POA: Diagnosis not present

## 2016-12-22 DIAGNOSIS — I129 Hypertensive chronic kidney disease with stage 1 through stage 4 chronic kidney disease, or unspecified chronic kidney disease: Secondary | ICD-10-CM | POA: Diagnosis not present

## 2016-12-22 DIAGNOSIS — N2581 Secondary hyperparathyroidism of renal origin: Secondary | ICD-10-CM | POA: Diagnosis not present

## 2016-12-22 DIAGNOSIS — N183 Chronic kidney disease, stage 3 (moderate): Secondary | ICD-10-CM | POA: Diagnosis not present

## 2016-12-22 DIAGNOSIS — D631 Anemia in chronic kidney disease: Secondary | ICD-10-CM | POA: Diagnosis not present

## 2016-12-22 DIAGNOSIS — R809 Proteinuria, unspecified: Secondary | ICD-10-CM | POA: Diagnosis not present

## 2017-01-07 DIAGNOSIS — R011 Cardiac murmur, unspecified: Secondary | ICD-10-CM | POA: Diagnosis not present

## 2017-01-07 DIAGNOSIS — R0602 Shortness of breath: Secondary | ICD-10-CM | POA: Diagnosis not present

## 2017-01-23 ENCOUNTER — Ambulatory Visit (INDEPENDENT_AMBULATORY_CARE_PROVIDER_SITE_OTHER): Payer: Self-pay | Admitting: Vascular Surgery

## 2017-01-23 ENCOUNTER — Encounter (INDEPENDENT_AMBULATORY_CARE_PROVIDER_SITE_OTHER): Payer: Self-pay

## 2017-01-27 DIAGNOSIS — I495 Sick sinus syndrome: Secondary | ICD-10-CM | POA: Diagnosis not present

## 2017-02-12 DIAGNOSIS — I252 Old myocardial infarction: Secondary | ICD-10-CM | POA: Diagnosis not present

## 2017-02-12 DIAGNOSIS — I119 Hypertensive heart disease without heart failure: Secondary | ICD-10-CM | POA: Diagnosis not present

## 2017-02-12 DIAGNOSIS — I2510 Atherosclerotic heart disease of native coronary artery without angina pectoris: Secondary | ICD-10-CM | POA: Diagnosis not present

## 2017-03-16 ENCOUNTER — Other Ambulatory Visit (INDEPENDENT_AMBULATORY_CARE_PROVIDER_SITE_OTHER): Payer: Self-pay | Admitting: Vascular Surgery

## 2017-03-16 DIAGNOSIS — I723 Aneurysm of iliac artery: Secondary | ICD-10-CM

## 2017-03-17 ENCOUNTER — Ambulatory Visit (INDEPENDENT_AMBULATORY_CARE_PROVIDER_SITE_OTHER): Payer: Medicare HMO | Admitting: Vascular Surgery

## 2017-03-17 ENCOUNTER — Ambulatory Visit (INDEPENDENT_AMBULATORY_CARE_PROVIDER_SITE_OTHER): Payer: Medicare HMO

## 2017-03-17 VITALS — BP 177/90 | HR 83 | Resp 16 | Wt 178.0 lb

## 2017-03-17 DIAGNOSIS — I723 Aneurysm of iliac artery: Secondary | ICD-10-CM

## 2017-03-17 DIAGNOSIS — I10 Essential (primary) hypertension: Secondary | ICD-10-CM | POA: Diagnosis not present

## 2017-03-17 DIAGNOSIS — E785 Hyperlipidemia, unspecified: Secondary | ICD-10-CM | POA: Diagnosis not present

## 2017-03-17 NOTE — Assessment & Plan Note (Signed)
lipid control important in reducing the progression of atherosclerotic disease. Continue statin therapy  

## 2017-03-17 NOTE — Assessment & Plan Note (Signed)
blood pressure control important in reducing the progression of atherosclerotic disease. On appropriate oral medications.  

## 2017-03-17 NOTE — Progress Notes (Signed)
MRN : [id number]  Devin Washington is a 83 y.o. (02-Jul-1923) male who presents with chief complaint of  Chief Complaint  Patient presents with  . Follow-up  .  History of Present Illness: Patient returns today in follow up of Iliac aneurysm and ectatic aorta. He has no complaints today. He denies any aneurysm related symptoms. Specifically, the patient denies new back or abdominal pain, or signs of peripheral embolization. His duplex today demonstrates a stable abdominal aorta measuring about 2.9 cm in maximal diameter and a stable right common iliac artery aneurysm measuring 2.7 cm in maximal diameter. This is unchanged from his CT scan 6 months ago.  Current Outpatient Prescriptions  Medication Sig Dispense Refill  . benazepril (LOTENSIN) 20 MG tablet Take 40 mg by mouth daily.     . pantoprazole (PROTONIX) 40 MG tablet Take 1 tablet (40 mg total) by mouth 2 (two) times daily. 60 tablet 2  . simvastatin (ZOCOR) 20 MG tablet Take 20 mg by mouth daily.    . sucralfate (CARAFATE) 1 GM/10ML suspension Take 10 mLs (1 g total) by mouth 2 (two) times daily. 420 mL 0  . traMADol (ULTRAM) 50 MG tablet Take by mouth every 6 (six) hours as needed.    . triamterene-hydrochlorothiazide (DYAZIDE) 37.5-25 MG per capsule Take 1 capsule by mouth daily.     No current facility-administered medications for this visit.     Past Medical History:  Diagnosis Date  . A-fib ([place])   . CHF (congestive heart failure) ([place])   . CKD (chronic kidney disease), stage III   . HLD (hyperlipidemia)   . Hypertension   . Prostate cancer [facility])    Prostatectomy.  . SSS (sick sinus syndrome) [facility])     Past Surgical History:  Procedure Laterality Date  . ESOPHAGOGASTRODUODENOSCOPY N/A 08/09/2015   Procedure: ESOPHAGOGASTRODUODENOSCOPY (EGD) looking in the stomach with a lighted tube to evaluate and treat;  Surgeon: Lollie Sails, MD;  Location: [facility] ENDOSCOPY;  Service: Endoscopy;  Laterality: N/A;  Procedure to  be done tomorrow afternoon, 08/08/2015. Awaiting cardiology consult  . FLEXIBLE SIGMOIDOSCOPY N/A 08/09/2015   Procedure: FLEXIBLE SIGMOIDOSCOPY looking up the rectum into the distal colon with a lighted tube to examine and treat;  Surgeon: Lollie Sails, MD;  Location: [facility] ENDOSCOPY;  Service: Endoscopy;  Laterality: N/A;  Procedure to be done tomorrow afternoon, 08/08/2015. Awaiting cardiology consult.  Marland Kitchen JOINT REPLACEMENT    . PACEMAKER INSERTION    . PROSTATE SURGERY      Social History Social History  Substance Use Topics  . Smoking status: Former Research scientist (life sciences)  . Smokeless tobacco: Not on file  . Alcohol use No  No IV drug use  Family History No bleeding or clotting disorders  No Known Allergies   REVIEW OF SYSTEMS (Negative unless checked)  Constitutional: [] Weight loss  [] Fever  [] Chills Cardiac: [] Chest pain   [] Chest pressure   [] Palpitations   [] Shortness of breath when laying flat   [] Shortness of breath at rest   [] Shortness of breath with exertion. Vascular:  [] Pain in legs with walking   [] Pain in legs at rest   [] Pain in legs when laying flat   [] Claudication   [] Pain in feet when walking  [] Pain in feet at rest  [] Pain in feet when laying flat   [] History of DVT   [] Phlebitis   [] Swelling in legs   [] Varicose veins   [] Non-healing ulcers Pulmonary:   [] Uses home oxygen   [] Productive  cough   [] Hemoptysis   [] Wheeze  [] COPD   [] Asthma Neurologic:  [] Dizziness  [] Blackouts   [] Seizures   [] History of stroke   [] History of TIA  [] Aphasia   [] Temporary blindness   [] Dysphagia   [] Weakness or numbness in arms   [] Weakness or numbness in legs Musculoskeletal:  [x] Arthritis   [] Joint swelling   [] Joint pain   [] Low back pain Hematologic:  [] Easy bruising  [] Easy bleeding   [] Hypercoagulable state   [] Anemic   Gastrointestinal:  [] Blood in stool   [] Vomiting blood  [] Gastroesophageal reflux/heartburn   [] Abdominal pain Genitourinary:  [] Chronic kidney disease   [] Difficult  urination  [] Frequent urination  [] Burning with urination   [] Hematuria Skin:  [] Rashes   [] Ulcers   [] Wounds Psychological:  [] History of anxiety   []  History of major depression.  Physical Examination  BP (!) 177/90   Pulse 83   Resp 16   Wt 80.7 kg (178 lb)   BMI 25.54 kg/m  Gen:  WD/WN, NAD. Appears younger than stated age. Head: [place]/AT, No temporalis wasting. Ear/Nose/Throat: Hearing grossly intact, nares w/o erythema or drainage, trachea midline Eyes: Conjunctiva clear. Sclera non-icteric Neck: Supple.  No JVD.  Pulmonary:  Good air movement, no use of accessory muscles.  Cardiac: RRR, normal S1, S2 Vascular:  Vessel Right Left  Radial Palpable Palpable                                   Gastrointestinal: soft, non-tender/non-distended. Mild increased aortic impulse Musculoskeletal: M/S 5/5 throughout.  No deformity or atrophy. Walks with a cane Neurologic: Sensation grossly intact in extremities.  Symmetrical.  Speech is fluent.  Psychiatric: Judgment intact, Mood & affect appropriate for pt's clinical situation. Dermatologic: No rashes or ulcers noted.  No cellulitis or open wounds.       Labs No results found for this or any previous visit (from the past 2160 hour(s)).  Radiology No results found.   Assessment/Plan  HLD (hyperlipidemia) lipid control important in reducing the progression of atherosclerotic disease. Continue statin therapy   HTN (hypertension) blood pressure control important in reducing the progression of atherosclerotic disease. On appropriate oral medications.   Iliac artery aneurysm (HCC)  His duplex today demonstrates a stable abdominal aorta measuring about 2.9 cm in maximal diameter and a stable right common iliac artery aneurysm measuring 2.7 cm in maximal diameter. This is unchanged from his CT scan 6 months ago. At this size and a 83 year old patient, no intervention would currently be recommended. Would likely defer repair  until he reaches at least 3 and possibly 3.5 cm in the iliac artery or 5 cm in the aorta. Plan to recheck in 6 months. Blood pressure control remains important in reducing progression of disease.    Leotis Pain, MD  03/17/2017 5:08 PM    This note was created with Dragon medical transcription system.  Any errors from dictation are purely unintentional

## 2017-03-17 NOTE — Assessment & Plan Note (Signed)
His duplex today demonstrates a stable abdominal aorta measuring about 2.9 cm in maximal diameter and a stable right common iliac artery aneurysm measuring 2.7 cm in maximal diameter. This is unchanged from his CT scan 6 months ago. At this size and a 83 year old patient, no intervention would currently be recommended. Would likely defer repair until he reaches at least 3 and possibly 3.5 cm in the iliac artery or 5 cm in the aorta. Plan to recheck in 6 months. Blood pressure control remains important in reducing progression of disease.

## 2017-03-30 DIAGNOSIS — I129 Hypertensive chronic kidney disease with stage 1 through stage 4 chronic kidney disease, or unspecified chronic kidney disease: Secondary | ICD-10-CM | POA: Diagnosis not present

## 2017-03-30 DIAGNOSIS — N2581 Secondary hyperparathyroidism of renal origin: Secondary | ICD-10-CM | POA: Diagnosis not present

## 2017-03-30 DIAGNOSIS — N183 Chronic kidney disease, stage 3 (moderate): Secondary | ICD-10-CM | POA: Diagnosis not present

## 2017-03-30 DIAGNOSIS — R809 Proteinuria, unspecified: Secondary | ICD-10-CM | POA: Diagnosis not present

## 2017-04-02 DIAGNOSIS — E785 Hyperlipidemia, unspecified: Secondary | ICD-10-CM | POA: Diagnosis not present

## 2017-04-02 DIAGNOSIS — N2889 Other specified disorders of kidney and ureter: Secondary | ICD-10-CM | POA: Diagnosis not present

## 2017-04-02 DIAGNOSIS — N183 Chronic kidney disease, stage 3 (moderate): Secondary | ICD-10-CM | POA: Diagnosis not present

## 2017-04-02 DIAGNOSIS — I10 Essential (primary) hypertension: Secondary | ICD-10-CM | POA: Diagnosis not present

## 2017-04-02 DIAGNOSIS — D631 Anemia in chronic kidney disease: Secondary | ICD-10-CM | POA: Diagnosis not present

## 2017-04-23 ENCOUNTER — Emergency Department: Payer: Medicare HMO

## 2017-04-23 ENCOUNTER — Emergency Department
Admission: EM | Admit: 2017-04-23 | Discharge: 2017-04-23 | Disposition: A | Discharge status: Home / Self Care | Payer: Medicare HMO | Attending: Emergency Medicine | Admitting: Emergency Medicine

## 2017-04-23 DIAGNOSIS — Z8546 Personal history of malignant neoplasm of prostate: Secondary | ICD-10-CM | POA: Diagnosis not present

## 2017-04-23 DIAGNOSIS — Z87891 Personal history of nicotine dependence: Secondary | ICD-10-CM | POA: Insufficient documentation

## 2017-04-23 DIAGNOSIS — I509 Heart failure, unspecified: Secondary | ICD-10-CM | POA: Diagnosis not present

## 2017-04-23 DIAGNOSIS — I130 Hypertensive heart and chronic kidney disease with heart failure and stage 1 through stage 4 chronic kidney disease, or unspecified chronic kidney disease: Secondary | ICD-10-CM | POA: Insufficient documentation

## 2017-04-23 DIAGNOSIS — J9801 Acute bronchospasm: Secondary | ICD-10-CM | POA: Insufficient documentation

## 2017-04-23 DIAGNOSIS — Z79899 Other long term (current) drug therapy: Secondary | ICD-10-CM | POA: Insufficient documentation

## 2017-04-23 DIAGNOSIS — R0602 Shortness of breath: Secondary | ICD-10-CM | POA: Diagnosis not present

## 2017-04-23 DIAGNOSIS — R918 Other nonspecific abnormal finding of lung field: Secondary | ICD-10-CM | POA: Diagnosis not present

## 2017-04-23 DIAGNOSIS — N183 Chronic kidney disease, stage 3 (moderate): Secondary | ICD-10-CM | POA: Diagnosis not present

## 2017-04-23 DIAGNOSIS — R0600 Dyspnea, unspecified: Secondary | ICD-10-CM | POA: Diagnosis not present

## 2017-04-23 LAB — CBC
HCT: 32.5 % — ABNORMAL LOW (ref 40.0–52.0)
Hemoglobin: 10.7 g/dL — ABNORMAL LOW (ref 13.0–18.0)
MCH: 27.6 pg (ref 26.0–34.0)
MCHC: 32.9 g/dL (ref 32.0–36.0)
MCV: 83.9 fL (ref 80.0–100.0)
PLATELETS: 224 10*3/uL (ref 150–440)
RBC: 3.88 MIL/uL — ABNORMAL LOW (ref 4.40–5.90)
RDW: 18.1 % — AB (ref 11.5–14.5)
WBC: 13.2 10*3/uL — ABNORMAL HIGH (ref 3.8–10.6)

## 2017-04-23 LAB — BASIC METABOLIC PANEL
ANION GAP: 10 (ref 5–15)
BUN: 22 mg/dL — AB (ref 6–20)
CO2: 27 mmol/L (ref 22–32)
CREATININE: 1.41 mg/dL — AB (ref 0.61–1.24)
Calcium: 9.7 mg/dL (ref 8.9–10.3)
Chloride: 102 mmol/L (ref 101–111)
GFR calc non Af Amer: 42 mL/min — ABNORMAL LOW (ref >60)
GFR, EST AFRICAN AMERICAN: 49 mL/min — AB (ref >60)
Glucose, Bld: 134 mg/dL — ABNORMAL HIGH (ref 65–99)
Potassium: 3.6 mmol/L (ref 3.5–5.1)
Sodium: 139 mmol/L (ref 135–145)

## 2017-04-23 LAB — TROPONIN I: Troponin I: <0.03 ng/mL (ref <0.03)

## 2017-04-23 MED ORDER — ALBUTEROL SULFATE HFA 108 (90 BASE) MCG/ACT IN AERS: 2.0000 | INHALATION_SPRAY | Freq: Four times a day (QID) | RESPIRATORY_TRACT | 2 refills | Status: DC | PRN

## 2017-04-23 MED ORDER — IPRATROPIUM-ALBUTEROL 0.5-2.5 (3) MG/3ML IN SOLN
3.0000 mL | Freq: Once | RESPIRATORY_TRACT | Status: AC
  Administered 2017-04-23: 3 mL via RESPIRATORY_TRACT
  Filled 2017-04-23: qty 3

## 2017-04-23 MED ORDER — PREDNISONE 20 MG PO TABS: 40.0000 mg | ORAL_TABLET | Freq: Every day | ORAL | 0 refills | Status: DC

## 2017-04-23 NOTE — ED Provider Notes (Signed)
[facility] Emergency Department Provider Note   ____________________________________________    I have reviewed the triage vital signs and the nursing notes.   HISTORY  Chief Complaint Shortness of Breath     HPI Devin Washington is a 83 y.o. male who presents with shortness of breath. Patient reports he was mowing the lawn today and became short of breath. He denies chest pain or pleurisy. Does report a history of CHF. He reports he smoked for greater than 10 years in the past but does not currently smoke. Is not sure if he has COPD. Denies fevers or chills but reports he has had a cold for 2 weeks now. No recent travel. No calf pain.   Past Medical History:  Diagnosis Date  . A-fib ([place])   . CHF (congestive heart failure) ([place])   . CKD (chronic kidney disease), stage III   . HLD (hyperlipidemia)   . Hypertension   . Prostate cancer [facility])    Prostatectomy.  . SSS (sick sinus syndrome) [facility])     Patient Active Problem List   Diagnosis Date Noted  . Iliac artery aneurysm ([place]) 03/17/2017  . Protein-calorie malnutrition, severe ([place]) 08/08/2015  . Abdominal pain, epigastric 08/06/2015  . Nausea and vomiting 08/06/2015  . HTN (hypertension) 08/06/2015  . HLD (hyperlipidemia) 08/06/2015  . Back pain 08/06/2015  . CKD (chronic kidney disease), stage III 08/06/2015  . Abdominal pain 08/06/2015    Past Surgical History:  Procedure Laterality Date  . ESOPHAGOGASTRODUODENOSCOPY N/A 08/09/2015   Procedure: ESOPHAGOGASTRODUODENOSCOPY (EGD) looking in the stomach with a lighted tube to evaluate and treat;  Surgeon: Lollie Sails, MD;  Location: [facility] ENDOSCOPY;  Service: Endoscopy;  Laterality: N/A;  Procedure to be done tomorrow afternoon, 08/08/2015. Awaiting cardiology consult  . FLEXIBLE SIGMOIDOSCOPY N/A 08/09/2015   Procedure: FLEXIBLE SIGMOIDOSCOPY looking up the rectum into the distal colon with a lighted tube to examine and treat;   Surgeon: Lollie Sails, MD;  Location: [facility] ENDOSCOPY;  Service: Endoscopy;  Laterality: N/A;  Procedure to be done tomorrow afternoon, 08/08/2015. Awaiting cardiology consult.  Marland Kitchen JOINT REPLACEMENT    . PACEMAKER INSERTION    . PROSTATE SURGERY      Prior to Admission medications   Medication Sig Start Date End Date Taking? Authorizing Provider  ANORO ELLIPTA 62.5-25 MCG/INH AEPB Inhale 1 puff into the lungs 2 (two) times daily. 04/13/17  Yes [provider]  benazepril (LOTENSIN) 40 MG tablet Take 1 tablet by mouth daily. 04/23/17  Yes [provider]  carvedilol (COREG) 3.125 MG tablet Take 1 tablet by mouth 2 (two) times daily. 04/23/17  Yes [provider]  furosemide (LASIX) 20 MG tablet Take 1 tablet by mouth daily. 04/23/17  Yes [provider]  pantoprazole (PROTONIX) 40 MG tablet Take 1 tablet (40 mg total) by mouth 2 (two) times daily. 08/09/15  Yes Fritzi Mandes, MD  simvastatin (ZOCOR) 20 MG tablet Take 20 mg by mouth daily.   Yes [provider]  traMADol (ULTRAM) 50 MG tablet Take by mouth every 6 (six) hours as needed.   Yes [provider]  traZODone (DESYREL) 50 MG tablet Take 1 tablet by mouth at bedtime as needed.  04/13/17  Yes [provider]  albuterol (PROVENTIL HFA;VENTOLIN HFA) 108 (90 Base) MCG/ACT inhaler Inhale 2 puffs into the lungs every 6 (six) hours as needed for wheezing or shortness of breath. 04/23/17   Lavonia Drafts, MD  predniSONE (DELTASONE) 20  MG tablet Take 2 tablets (40 mg total) by mouth daily with breakfast. 04/23/17   Lavonia Drafts, MD  sucralfate (CARAFATE) 1 GM/10ML suspension Take 10 mLs (1 g total) by mouth 2 (two) times daily. Patient not taking: Reported on 04/23/2017 08/09/15   Fritzi Mandes, MD  triamterene-hydrochlorothiazide (DYAZIDE) 37.5-25 MG per capsule Take 1 capsule by mouth daily.    [provider]     Allergies Patient has no known allergies.  Family History    Problem Relation Age of Onset  . Family history unknown: Yes    Social History Social History  Substance Use Topics  . Smoking status: Former Research scientist (life sciences)  . Smokeless tobacco: Not on file  . Alcohol use No    Review of Systems  Constitutional: No fever/chills Eyes: No visual changes.  ENT: No sore throat. Cardiovascular: Denies chest pain. Respiratory: As above Gastrointestinal: No abdominal pain.  No nausea, no vomiting.   Genitourinary: Negative for dysuria. Musculoskeletal: Negative for back pain. Skin: Negative for rash. Neurological: Negative for headaches or weakness   ____________________________________________   PHYSICAL EXAM:  VITAL SIGNS: ED Triage Vitals  Enc Vitals Group     BP 04/23/17 1817 (!) 188/108     Pulse Rate 04/23/17 1817 (!) 109     Resp 04/23/17 1817 20     Temp 04/23/17 1817 98.2 F (36.8 C)     Temp src --      SpO2 04/23/17 1817 97 %     Weight 04/23/17 1820 186 lb (84.4 kg)     Height 04/23/17 1820 5\' 10"  (1.778 m)     Head Circumference --      Peak Flow --      Pain Score 04/23/17 1814 0     Pain Loc --      Pain Edu? --      Excl. in [place]? --     Constitutional: Alert and oriented. No acute distress. Pleasant and interactive Eyes: Conjunctivae are normal.  Head: Atraumatic. Nose: No congestion/rhinnorhea. Mouth/Throat: Mucous membranes are moist.    Cardiovascular: Normal rate, regular rhythm. Grossly normal heart sounds.  Good peripheral circulation. Respiratory: increased respiratory effort with tachypnea  No retractions. Scattered wheezes Gastrointestinal: Soft and nontender. No distention.  No CVA tenderness. Genitourinary: deferred Musculoskeletal: No lower extremity tenderness nor edema.  Warm and well perfused Neurologic:  Normal speech and language. No gross focal neurologic deficits are appreciated.  Skin:  Skin is warm, dry and intact. No rash noted. Psychiatric: Mood and affect are normal. Speech and behavior are  normal.  ____________________________________________   LABS (all labs ordered are listed, but only abnormal results are displayed)  Labs Reviewed  BASIC METABOLIC PANEL - Abnormal; Notable for the following:       Result Value   Glucose, Bld 134 (*)    BUN 22 (*)    Creatinine, Ser 1.41 (*)    GFR calc non Af Amer 42 (*)    GFR calc Af Amer 49 (*)    All other components within normal limits  CBC - Abnormal; Notable for the following:    WBC 13.2 (*)    RBC 3.88 (*)    Hemoglobin 10.7 (*)    HCT 32.5 (*)    RDW 18.1 (*)    All other components within normal limits  TROPONIN I   ____________________________________________  EKG  ED ECG REPORT I, Lavonia Drafts, the attending physician, personally viewed and interpreted this ECG.  Date: 04/23/2017  Rhythm:  Paced rhythm QRS Axis: normal Intervals: normal ST/T Wave abnormalities: normal Conduction Disturbances: Paced   ____________________________________________  RADIOLOGY  Chest x-ray unremarkable ____________________________________________   PROCEDURES  Procedure(s) performed: No    Critical Care performed: No ____________________________________________   INITIAL IMPRESSION / ASSESSMENT AND PLAN / ED COURSE  Pertinent labs & imaging results that were available during my care of the patient were reviewed by me and considered in my medical decision making (see chart for details).  Patient presents with shortness of breath. He reports he is significantly better after receiving Solu-Medrol and DuoNeb's via EMS. He does still have scattered wheezing. We will give an additional DuoNeb, check labs chest x-ray EKG and reevaluate   ----------------------------------------- 8:17 PM on 04/23/2017 -----------------------------------------  Patient with significant improvement. He feels much better and feels he is ready to go home. I have convinced him to do an ambulatory pulse ox before we make a decision  but overall he does look much better clinically  ----------------------------------------- 8:25 PM on 04/23/2017 -----------------------------------------  Patient tolerated ambulation well, appropriate for discharge at this time. Family agrees with plan  ____________________________________________   FINAL CLINICAL IMPRESSION(S) / ED DIAGNOSES  Final diagnoses:  Bronchospasm, acute  Shortness of breath      NEW MEDICATIONS STARTED DURING THIS VISIT:  New Prescriptions   ALBUTEROL (PROVENTIL HFA;VENTOLIN HFA) 108 (90 BASE) MCG/ACT INHALER    Inhale 2 puffs into the lungs every 6 (six) hours as needed for wheezing or shortness of breath.   PREDNISONE (DELTASONE) 20 MG TABLET    Take 2 tablets (40 mg total) by mouth daily with breakfast.     Note:  This document was prepared using Dragon voice recognition software and may include unintentional dictation errors.    Lavonia Drafts, MD 04/23/17 2025

## 2017-04-23 NOTE — ED Notes (Signed)
Family at bedside. 

## 2017-04-23 NOTE — ED Triage Notes (Signed)
Pt arrives via [facility] from home for diff breathing. Pt was sweaty and couldn't make sentences upon EMS arrival. PT given 1 douneb and 2 albuterol, 125mg  solumedrol en route. Pt states he is feeling a lot better after meds, talking in complete sentences upon arrival, smiling, no distress noted. Pt states "I might have a little COPD." pt hx CHF, states little bit of swelling noted to lower legs. Pt does NOT wear oxygen at home. Has a pacemaker. EMS reports lungs diminished and wheezes. Alert and oriented.

## 2017-04-23 NOTE — ED Notes (Signed)
Pt returned from xray via stretcher.

## 2017-04-23 NOTE — ED Notes (Signed)
Pt taken to xray via stretcher  

## 2017-04-23 NOTE — ED Notes (Signed)
Called xray to inform them pt is done with breathing treatment

## 2017-04-23 NOTE — ED Notes (Signed)
Ambulated pt in room with pulse ox on finger. VS stable before and after

## 2017-05-04 DIAGNOSIS — I2510 Atherosclerotic heart disease of native coronary artery without angina pectoris: Secondary | ICD-10-CM | POA: Diagnosis not present

## 2017-05-04 DIAGNOSIS — J431 Panlobular emphysema: Secondary | ICD-10-CM | POA: Diagnosis not present

## 2017-05-04 DIAGNOSIS — I119 Hypertensive heart disease without heart failure: Secondary | ICD-10-CM | POA: Diagnosis not present

## 2017-06-30 DIAGNOSIS — I5032 Chronic diastolic (congestive) heart failure: Secondary | ICD-10-CM | POA: Diagnosis not present

## 2017-06-30 DIAGNOSIS — M545 Low back pain: Secondary | ICD-10-CM | POA: Diagnosis not present

## 2017-06-30 DIAGNOSIS — I10 Essential (primary) hypertension: Secondary | ICD-10-CM | POA: Diagnosis not present

## 2017-06-30 DIAGNOSIS — I495 Sick sinus syndrome: Secondary | ICD-10-CM | POA: Diagnosis not present

## 2017-06-30 DIAGNOSIS — R011 Cardiac murmur, unspecified: Secondary | ICD-10-CM | POA: Diagnosis not present

## 2017-06-30 DIAGNOSIS — I429 Cardiomyopathy, unspecified: Secondary | ICD-10-CM | POA: Diagnosis not present

## 2017-06-30 DIAGNOSIS — I480 Paroxysmal atrial fibrillation: Secondary | ICD-10-CM | POA: Diagnosis not present

## 2017-06-30 DIAGNOSIS — R001 Bradycardia, unspecified: Secondary | ICD-10-CM | POA: Diagnosis not present

## 2017-06-30 DIAGNOSIS — R0602 Shortness of breath: Secondary | ICD-10-CM | POA: Diagnosis not present

## 2017-06-30 DIAGNOSIS — E784 Other hyperlipidemia: Secondary | ICD-10-CM | POA: Diagnosis not present

## 2017-08-27 DIAGNOSIS — I495 Sick sinus syndrome: Secondary | ICD-10-CM | POA: Diagnosis not present

## 2017-09-03 DIAGNOSIS — I119 Hypertensive heart disease without heart failure: Secondary | ICD-10-CM | POA: Diagnosis not present

## 2017-09-14 DIAGNOSIS — R69 Illness, unspecified: Secondary | ICD-10-CM | POA: Diagnosis not present

## 2017-09-18 ENCOUNTER — Ambulatory Visit (INDEPENDENT_AMBULATORY_CARE_PROVIDER_SITE_OTHER): Payer: Medicare HMO | Admitting: Vascular Surgery

## 2017-09-18 ENCOUNTER — Ambulatory Visit (INDEPENDENT_AMBULATORY_CARE_PROVIDER_SITE_OTHER): Payer: Medicare HMO

## 2017-09-18 ENCOUNTER — Encounter (INDEPENDENT_AMBULATORY_CARE_PROVIDER_SITE_OTHER): Payer: Self-pay | Admitting: Vascular Surgery

## 2017-09-18 VITALS — BP 155/86 | HR 72 | Resp 16 | Ht 69.0 in | Wt 186.0 lb

## 2017-09-18 DIAGNOSIS — I723 Aneurysm of iliac artery: Secondary | ICD-10-CM | POA: Diagnosis not present

## 2017-09-18 DIAGNOSIS — E785 Hyperlipidemia, unspecified: Secondary | ICD-10-CM

## 2017-09-18 DIAGNOSIS — I10 Essential (primary) hypertension: Secondary | ICD-10-CM

## 2017-09-18 NOTE — Patient Instructions (Signed)
Abdominal Aortic Aneurysm Blood pumps away from the heart through tubes (blood vessels) called arteries. Aneurysms are weak or damaged places in the wall of an artery. It bulges out like a balloon. An abdominal aortic aneurysm happens in the main artery of the body (aorta). It can burst or tear, causing bleeding inside the body. This is an emergency. It needs treatment right away. What are the causes? The exact cause is unknown. Things that could cause this problem include:  Fat and other substances building up in the lining of a tube.  Swelling of the walls of a blood vessel.  Certain tissue diseases.  Belly (abdominal) trauma.  An infection in the main artery of the body.  What increases the risk? There are things that make it more likely for you to have an aneurysm. These include:  Being over the age of 81 years old.  Having high blood pressure (hypertension).  Being a male.  Being white.  Being very overweight (obese).  Having a family history of aneurysm.  Using tobacco products.  What are the signs or symptoms? Symptoms depend on the size of the aneurysm and how fast it grows. There may not be symptoms. If symptoms occur, they can include:  Pain (belly, side, lower back, or groin).  Feeling full after eating a small amount of food.  Feeling sick to your stomach (nauseous), throwing up (vomiting), or both.  Feeling a lump in your belly that feels like it is beating (pulsating).  Feeling like you will pass out (faint).  How is this treated?  Medicine to control blood pressure and pain.  Imaging tests to see if the aneurysm gets bigger.  Surgery. How is this prevented? To lessen your chance of getting this condition:  Stop smoking. Stop chewing tobacco.  Limit or avoid alcohol.  Keep your blood pressure, blood sugar, and cholesterol within normal limits.  Eat less salt.  Eat foods low in saturated fats and cholesterol. These are found in animal and  whole dairy products.  Eat more fiber. Fiber is found in whole grains, vegetables, and fruits.  Keep a healthy weight.  Stay active and exercise often.  This information is not intended to replace advice given to you by your health care provider. Make sure you discuss any questions you have with your health care provider. Document Released: 03/28/2013 Document Revised: 05/08/2016 Document Reviewed: 12/31/2012 Elsevier Interactive Patient Education  2017 [employer]  

## 2017-09-18 NOTE — Progress Notes (Signed)
MRN : [id number]  Devin Washington is a 83 y.o. (09-27-1923) male who presents with chief complaint of  Chief Complaint  Patient presents with  . Follow-up    16mos. Aorta iliac  .  History of Present Illness: Patient returns today in follow up of iliac artery aneurysm. He is doing well and remains active despite his advanced age. He denies any aneurysm related symptoms. Specifically, the patient denies new back or abdominal pain, or signs of peripheral embolization. 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 The left iliac artery is mildly aneurysmal 1.6 cm which is also stable.  Current Outpatient Prescriptions  Medication Sig Dispense Refill  . benazepril (LOTENSIN) 20 MG tablet Take 40 mg by mouth daily.     . pantoprazole (PROTONIX) 40 MG tablet Take 1 tablet (40 mg total) by mouth 2 (two) times daily. 60 tablet 2  . simvastatin (ZOCOR) 20 MG tablet Take 20 mg by mouth daily.    . sucralfate (CARAFATE) 1 GM/10ML suspension Take 10 mLs (1 g total) by mouth 2 (two) times daily. 420 mL 0  . traMADol (ULTRAM) 50 MG tablet Take by mouth every 6 (six) hours as needed.    . triamterene-hydrochlorothiazide (DYAZIDE) 37.5-25 MG per capsule Take 1 capsule by mouth daily.     No current facility-administered medications for this visit.         Past Medical History:  Diagnosis Date  . A-fib ([place])   . CHF (congestive heart failure) ([place])   . CKD (chronic kidney disease), stage III   . HLD (hyperlipidemia)   . Hypertension   . Prostate cancer [facility])    Prostatectomy.  . SSS (sick sinus syndrome) [facility])          Past Surgical History:  Procedure Laterality Date  . ESOPHAGOGASTRODUODENOSCOPY N/A 08/09/2015   Procedure: ESOPHAGOGASTRODUODENOSCOPY (EGD) looking in the stomach with a lighted tube to evaluate and treat;  Surgeon: Lollie Sails, MD;  Location: [facility] ENDOSCOPY;  Service:  Endoscopy;  Laterality: N/A;  Procedure to be done tomorrow afternoon, 08/08/2015. Awaiting cardiology consult  . FLEXIBLE SIGMOIDOSCOPY N/A 08/09/2015   Procedure: FLEXIBLE SIGMOIDOSCOPY looking up the rectum into the distal colon with a lighted tube to examine and treat;  Surgeon: Lollie Sails, MD;  Location: [facility] ENDOSCOPY;  Service: Endoscopy;  Laterality: N/A;  Procedure to be done tomorrow afternoon, 08/08/2015. Awaiting cardiology consult.  Marland Kitchen JOINT REPLACEMENT    . PACEMAKER INSERTION    . PROSTATE SURGERY      Social History     Social History  Substance Use Topics  . Smoking status: Former Research scientist (life sciences)  . Smokeless tobacco: Not on file  . Alcohol use No  No IV drug use  Family History No bleeding or clotting disorders  No Known Allergies   REVIEW OF SYSTEMS (Negative unless checked)  Constitutional: [] Weight loss  [] Fever  [] Chills Cardiac: [] Chest pain   [] Chest pressure   [] Palpitations   [] Shortness of breath when laying flat   [] Shortness of breath at rest   [] Shortness of breath with exertion. Vascular:  [] Pain in legs with walking   [] Pain in legs at rest   [] Pain in legs when laying flat   [] Claudication   [] Pain in feet when walking  [] Pain in feet at rest  [] Pain in feet when laying flat   [] History of DVT   [] Phlebitis   [] Swelling in  legs   [] Varicose veins   [] Non-healing ulcers Pulmonary:   [] Uses home oxygen   [] Productive cough   [] Hemoptysis   [] Wheeze  [] COPD   [] Asthma Neurologic:  [] Dizziness  [] Blackouts   [] Seizures   [] History of stroke   [] History of TIA  [] Aphasia   [] Temporary blindness   [] Dysphagia   [] Weakness or numbness in arms   [] Weakness or numbness in legs Musculoskeletal:  [x] Arthritis   [] Joint swelling   [] Joint pain   [] Low back pain Hematologic:  [] Easy bruising  [] Easy bleeding   [] Hypercoagulable state   [] Anemic   Gastrointestinal:  [] Blood in stool   [] Vomiting blood  [] Gastroesophageal reflux/heartburn   [] Abdominal  pain Genitourinary:  [] Chronic kidney disease   [] Difficult urination  [] Frequent urination  [] Burning with urination   [] Hematuria Skin:  [] Rashes   [] Ulcers   [] Wounds Psychological:  [] History of anxiety   []  History of major depression.   Physical Examination  BP (!) 155/86 (BP Location: Right Arm)   Pulse 72   Resp 16   Ht 5\' 9"  (1.753 m)   Wt 84.4 kg (186 lb)   BMI 27.47 kg/m  Gen:  WD/WN, NAD. Appears far younger than his stated age Head: [place]/AT, No temporalis wasting. Ear/Nose/Throat: Hearing grossly intact, nares w/o erythema or drainage, trachea midline Eyes: Conjunctiva clear. Sclera non-icteric Neck: Supple.  No JVD.  Pulmonary:  Good air movement, no use of accessory muscles.  Cardiac: RRR, normal S1, S2 Vascular:  Vessel Right Left  Radial Palpable Palpable                          PT Palpable Palpable  DP Palpable Palpable   Gastrointestinal: soft, non-tender/non-distended. No guarding/reflex. No increased aortic impulse Musculoskeletal: M/S 5/5 throughout.  No deformity or atrophy. Walks with a cane Neurologic: Sensation grossly intact in extremities.  Symmetrical.  Speech is fluent.  Psychiatric: Judgment intact, Mood & affect appropriate for pt's clinical situation. Dermatologic: No rashes or ulcers noted.  No cellulitis or open wounds.       Labs No results found for this or any previous visit (from the past 2160 hour(s)).  Radiology No results found.    Assessment/Plan HLD (hyperlipidemia) lipid control important in reducing the progression of atherosclerotic disease. Continue statin therapy   HTN (hypertension) blood pressure control important in reducing the progression of atherosclerotic disease. On appropriate oral medications.  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 The left iliac artery is mildly aneurysmal 1.6 cm  which is also stable. This is shown no change. We again discussed the importance of keeping his blood pressure controlled which she does ago job with. We will continue to follow this on 6 month intervals. We discussed that we would consider repair at 3 cm we given his advanced age, waiting until he reaches 3.5 cm would also be considered as well.    Leotis Pain, MD  09/18/2017 10:35 AM    This note was created with Dragon medical transcription system.  Any errors from dictation are purely unintentional

## 2017-09-18 NOTE — Assessment & Plan Note (Signed)
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 The left iliac artery is mildly aneurysmal 1.6 cm which is also stable. This is shown no change. We again discussed the importance of keeping his blood pressure controlled which she does ago job with. We will continue to follow this on 6 month intervals. We discussed that we would consider repair at 3 cm we given his advanced age, waiting until he reaches 3.5 cm would also be considered as well.

## 2017-10-26 DIAGNOSIS — R809 Proteinuria, unspecified: Secondary | ICD-10-CM | POA: Diagnosis not present

## 2017-10-26 DIAGNOSIS — N183 Chronic kidney disease, stage 3 (moderate): Secondary | ICD-10-CM | POA: Diagnosis not present

## 2017-10-26 DIAGNOSIS — I129 Hypertensive chronic kidney disease with stage 1 through stage 4 chronic kidney disease, or unspecified chronic kidney disease: Secondary | ICD-10-CM | POA: Diagnosis not present

## 2017-11-02 DIAGNOSIS — N183 Chronic kidney disease, stage 3 (moderate): Secondary | ICD-10-CM | POA: Diagnosis not present

## 2017-11-02 DIAGNOSIS — N2889 Other specified disorders of kidney and ureter: Secondary | ICD-10-CM | POA: Diagnosis not present

## 2017-11-02 DIAGNOSIS — D631 Anemia in chronic kidney disease: Secondary | ICD-10-CM | POA: Diagnosis not present

## 2017-11-02 DIAGNOSIS — I10 Essential (primary) hypertension: Secondary | ICD-10-CM | POA: Diagnosis not present

## 2017-11-02 DIAGNOSIS — E875 Hyperkalemia: Secondary | ICD-10-CM | POA: Diagnosis not present

## 2017-11-10 ENCOUNTER — Other Ambulatory Visit (INDEPENDENT_AMBULATORY_CARE_PROVIDER_SITE_OTHER): Payer: Self-pay | Admitting: Vascular Surgery

## 2017-11-10 DIAGNOSIS — E785 Hyperlipidemia, unspecified: Secondary | ICD-10-CM | POA: Diagnosis not present

## 2017-11-10 DIAGNOSIS — R29898 Other symptoms and signs involving the musculoskeletal system: Secondary | ICD-10-CM

## 2017-11-10 DIAGNOSIS — N189 Chronic kidney disease, unspecified: Secondary | ICD-10-CM | POA: Diagnosis not present

## 2017-11-10 DIAGNOSIS — I714 Abdominal aortic aneurysm, without rupture, unspecified: Secondary | ICD-10-CM

## 2017-11-10 DIAGNOSIS — J449 Chronic obstructive pulmonary disease, unspecified: Secondary | ICD-10-CM | POA: Diagnosis not present

## 2017-11-10 DIAGNOSIS — I10 Essential (primary) hypertension: Secondary | ICD-10-CM | POA: Diagnosis not present

## 2017-11-12 ENCOUNTER — Encounter (INDEPENDENT_AMBULATORY_CARE_PROVIDER_SITE_OTHER): Payer: Self-pay

## 2017-11-12 ENCOUNTER — Ambulatory Visit (INDEPENDENT_AMBULATORY_CARE_PROVIDER_SITE_OTHER): Payer: Medicare HMO

## 2017-11-12 ENCOUNTER — Encounter (INDEPENDENT_AMBULATORY_CARE_PROVIDER_SITE_OTHER): Payer: Self-pay | Admitting: Vascular Surgery

## 2017-11-12 ENCOUNTER — Telehealth (INDEPENDENT_AMBULATORY_CARE_PROVIDER_SITE_OTHER): Payer: Self-pay | Admitting: Vascular Surgery

## 2017-11-12 ENCOUNTER — Ambulatory Visit (INDEPENDENT_AMBULATORY_CARE_PROVIDER_SITE_OTHER): Payer: Medicare HMO | Admitting: Vascular Surgery

## 2017-11-12 VITALS — BP 164/94 | HR 76 | Resp 16 | Ht 69.0 in | Wt 186.0 lb

## 2017-11-12 DIAGNOSIS — I714 Abdominal aortic aneurysm, without rupture, unspecified: Secondary | ICD-10-CM

## 2017-11-12 DIAGNOSIS — N183 Chronic kidney disease, stage 3 unspecified: Secondary | ICD-10-CM

## 2017-11-12 DIAGNOSIS — E785 Hyperlipidemia, unspecified: Secondary | ICD-10-CM | POA: Diagnosis not present

## 2017-11-12 DIAGNOSIS — R29898 Other symptoms and signs involving the musculoskeletal system: Secondary | ICD-10-CM | POA: Diagnosis not present

## 2017-11-12 DIAGNOSIS — I729 Aneurysm of unspecified site: Secondary | ICD-10-CM | POA: Diagnosis not present

## 2017-11-12 NOTE — Telephone Encounter (Signed)
lmvm requesting  an update on his father that was seen in clinic today. Asking what his plan of care is .

## 2017-11-12 NOTE — Telephone Encounter (Signed)
Spoke with the patient's son regarding his procedures and his concerns, he will be speaking with his granddaughter and deciding if they want to have home health come out. He will call me after talking with her.

## 2017-11-13 ENCOUNTER — Other Ambulatory Visit (INDEPENDENT_AMBULATORY_CARE_PROVIDER_SITE_OTHER): Payer: Self-pay | Admitting: Vascular Surgery

## 2017-11-13 ENCOUNTER — Encounter
Admission: RE | Admit: 2017-11-13 | Discharge: 2017-11-13 | Disposition: A | Discharge status: Home / Self Care | Payer: Medicare HMO | Source: Ambulatory Visit | Attending: Vascular Surgery | Admitting: Vascular Surgery

## 2017-11-13 ENCOUNTER — Other Ambulatory Visit: Payer: Self-pay

## 2017-11-13 ENCOUNTER — Other Ambulatory Visit (INDEPENDENT_AMBULATORY_CARE_PROVIDER_SITE_OTHER): Payer: Self-pay

## 2017-11-13 ENCOUNTER — Encounter (INDEPENDENT_AMBULATORY_CARE_PROVIDER_SITE_OTHER): Payer: Self-pay | Admitting: Vascular Surgery

## 2017-11-13 DIAGNOSIS — I130 Hypertensive heart and chronic kidney disease with heart failure and stage 1 through stage 4 chronic kidney disease, or unspecified chronic kidney disease: Secondary | ICD-10-CM | POA: Diagnosis not present

## 2017-11-13 DIAGNOSIS — I509 Heart failure, unspecified: Secondary | ICD-10-CM | POA: Diagnosis not present

## 2017-11-13 DIAGNOSIS — Z9841 Cataract extraction status, right eye: Secondary | ICD-10-CM | POA: Diagnosis not present

## 2017-11-13 DIAGNOSIS — I4891 Unspecified atrial fibrillation: Secondary | ICD-10-CM | POA: Diagnosis not present

## 2017-11-13 DIAGNOSIS — I724 Aneurysm of artery of lower extremity: Secondary | ICD-10-CM | POA: Diagnosis present

## 2017-11-13 DIAGNOSIS — Z87891 Personal history of nicotine dependence: Secondary | ICD-10-CM | POA: Diagnosis not present

## 2017-11-13 DIAGNOSIS — I10 Essential (primary) hypertension: Secondary | ICD-10-CM | POA: Insufficient documentation

## 2017-11-13 DIAGNOSIS — E785 Hyperlipidemia, unspecified: Secondary | ICD-10-CM | POA: Diagnosis not present

## 2017-11-13 DIAGNOSIS — Z9842 Cataract extraction status, left eye: Secondary | ICD-10-CM | POA: Diagnosis not present

## 2017-11-13 DIAGNOSIS — Z79899 Other long term (current) drug therapy: Secondary | ICD-10-CM | POA: Diagnosis not present

## 2017-11-13 DIAGNOSIS — Z8546 Personal history of malignant neoplasm of prostate: Secondary | ICD-10-CM | POA: Diagnosis not present

## 2017-11-13 DIAGNOSIS — I429 Cardiomyopathy, unspecified: Secondary | ICD-10-CM | POA: Diagnosis not present

## 2017-11-13 DIAGNOSIS — Z9079 Acquired absence of other genital organ(s): Secondary | ICD-10-CM | POA: Diagnosis not present

## 2017-11-13 DIAGNOSIS — Z96652 Presence of left artificial knee joint: Secondary | ICD-10-CM | POA: Diagnosis not present

## 2017-11-13 DIAGNOSIS — Z9889 Other specified postprocedural states: Secondary | ICD-10-CM | POA: Diagnosis not present

## 2017-11-13 DIAGNOSIS — N183 Chronic kidney disease, stage 3 (moderate): Secondary | ICD-10-CM | POA: Diagnosis not present

## 2017-11-13 DIAGNOSIS — I729 Aneurysm of unspecified site: Secondary | ICD-10-CM

## 2017-11-13 HISTORY — DX: Unspecified hemorrhoids: K64.9

## 2017-11-13 HISTORY — DX: Ulcer of esophagus without bleeding: K22.10

## 2017-11-13 HISTORY — DX: Shortness of breath: R06.02

## 2017-11-13 HISTORY — DX: Unspecified osteoarthritis, unspecified site: M19.90

## 2017-11-13 HISTORY — DX: Diaphragmatic hernia without obstruction or gangrene: K44.9

## 2017-11-13 HISTORY — DX: Cardiomyopathy, unspecified: I42.9

## 2017-11-13 HISTORY — DX: Duodenal ulcer, unspecified as acute or chronic, without hemorrhage or perforation: K26.9

## 2017-11-13 LAB — CREATININE, SERUM
Creatinine, Ser: 1.56 mg/dL — ABNORMAL HIGH (ref 0.61–1.24)
GFR calc Af Amer: 43 mL/min — ABNORMAL LOW (ref >60)
GFR calc non Af Amer: 37 mL/min — ABNORMAL LOW (ref >60)

## 2017-11-13 LAB — BUN: BUN: 27 mg/dL — ABNORMAL HIGH (ref 6–20)

## 2017-11-13 NOTE — Patient Instructions (Signed)
[facility]ASC[facility]  Your procedure is scheduled with Dr. Lucky Cowboy on Monday, December 3. 2018                     Follow instructions given to you by Dr. Lucky Cowboy.  On arrival go [facility] on first floor of the [facility].  Please call Dr Bunnie Domino office with any questions or concerns: [phone].

## 2017-11-13 NOTE — Progress Notes (Signed)
Subjective:    Patient ID: Devin Washington, male    DOB: 10-Sep-1923, 83 y.o.   MRN: [id number] Chief Complaint  Patient presents with  . Follow-up    LE Arterial   Patient presents to review vascular studies.  He was last seen in our office on September 18, 2017 for evaluation of "popliteal fullness" to the left lower extremity.  The patient symptoms are stable.  He continues to experience bilateral lower extremity pain with and without activity.  His symptoms are worse on the left when compared to the right.  The patient denies any rest pain or ulceration to the lower extremity.  A bilateral lower extremity arterial duplex was notable for large aneurysmal dilatations of the bilateral popliteal arteries with mural thrombus.  There appears to be external compression of the patent left popliteal vein from the left distal popliteal aneurysm.  Areas of dilatation are noted throughout the bilateral femoral arterial system.  There is no previous duplex exam available for comparison.  The patient denies any fever, nausea vomiting.   Review of Systems  Constitutional: Negative.   HENT: Negative.   Eyes: Negative.   Respiratory: Negative.   Cardiovascular:       Lower extremity pain  Gastrointestinal: Negative.   Endocrine: Negative.   Genitourinary: Negative.   Musculoskeletal: Negative.   Skin: Negative.   Allergic/Immunologic: Negative.   Neurological: Negative.   Hematological: Negative.   Psychiatric/Behavioral: Negative.       Objective:   Physical Exam  Constitutional: He is oriented to person, place, and time. He appears well-developed and well-nourished. No distress.  HENT:  Head: Normocephalic and atraumatic.  Eyes: Conjunctivae are normal. Pupils are equal, round, and reactive to light.  Neck: Normal range of motion.  Cardiovascular: Normal rate, regular rhythm, normal heart sounds and intact distal pulses.  Pulses:      Dorsalis pedis pulses are 2+ on the right side, and 2+ on  the left side.       Posterior tibial pulses are 2+ on the right side, and 2+ on the left side.  Pulmonary/Chest: Effort normal and breath sounds normal.  Musculoskeletal: Normal range of motion. He exhibits edema (Mild nonpitting bilateral lower extremity edema noted).  Neurological: He is alert and oriented to person, place, and time.  Skin: He is not diaphoretic.  Psychiatric: He has a normal mood and affect. His behavior is normal. Judgment and thought content normal.  Vitals reviewed.  BP (!) 164/94 (BP Location: Left Arm)   Pulse 76   Resp 16   Ht 5\' 9"  (1.753 m)   Wt 186 lb (84.4 kg)   BMI 27.47 kg/m   Past Medical History:  Diagnosis Date  . A-fib ([place])   . Cardiomyopathy ([place])   . CHF (congestive heart failure) ([place])   . CKD (chronic kidney disease), stage III ([place])   . DJD (degenerative joint disease)   . Duodenal ulcer 08/09/2015  . Erosive esophagitis   . HH (hiatus hernia)   . HLD (hyperlipidemia)   . Hypertension   . Prostate cancer [facility])    Prostatectomy.  . 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 Shortness of breath   . SSS (sick sinus syndrome) ([facility])    Social History   Socioeconomic History  . Marital status: Widowed    Spouse name: Not on file  . Number of children: Not on file  . Years of education: Not on file  . Highest education level: Not on file  Social  Needs  . Financial resource strain: Not on file  . Food insecurity - worry: Not on file  . Food insecurity - inability: Not on file  . Transportation needs - medical: Not on file  . Transportation needs - non-medical: Not on file  Occupational History  . Not on file  Tobacco Use  . Smoking status: Former Research scientist (life sciences)  . Smokeless tobacco: Former Network engineer and Sexual Activity  . Alcohol use: No    Alcohol/week: 0.0 oz  . Drug use: No  . Sexual activity: Not on file  Other Topics Concern  . Not on file  Social History Narrative  . Not on file   Past Surgical History:  Procedure Laterality  Date  . CATARACT EXTRACTION, BILATERAL    . ESOPHAGOGASTRODUODENOSCOPY N/A 08/09/2015   Procedure: ESOPHAGOGASTRODUODENOSCOPY (EGD) looking in the stomach with a lighted tube to evaluate and treat;  Surgeon: Lollie Sails, MD;  Location: [facility] ENDOSCOPY;  Service: Endoscopy;  Laterality: N/A;  Procedure to be done tomorrow afternoon, 08/08/2015. Awaiting cardiology consult  . EYE SURGERY    . FLEXIBLE SIGMOIDOSCOPY N/A 08/09/2015   Procedure: FLEXIBLE SIGMOIDOSCOPY looking up the rectum into the distal colon with a lighted tube to examine and treat;  Surgeon: Lollie Sails, MD;  Location: [facility] ENDOSCOPY;  Service: Endoscopy;  Laterality: N/A;  Procedure to be done tomorrow afternoon, 08/08/2015. Awaiting cardiology consult.  . INSERT / REPLACE / REMOVE PACEMAKER    . JOINT REPLACEMENT Left    left knee replacement  . PACEMAKER INSERTION    . PROSTATE SURGERY     Family History  Family history unknown: Yes   No Known Allergies     Assessment & Plan:  Patient presents to review vascular studies.  He was last seen in our office on September 18, 2017 for evaluation of "popliteal fullness" to the left lower extremity.  The patient symptoms are stable.  He continues to experience bilateral lower extremity pain with and without activity.  His symptoms are worse on the left when compared to the right.  The patient denies any rest pain or ulceration to the lower extremity.  A bilateral lower extremity arterial duplex was notable for large aneurysmal dilatations of the bilateral popliteal arteries with mural thrombus.  There appears to be external compression of the patent left popliteal vein from the left distal popliteal aneurysm.  Areas of dilatation are noted throughout the bilateral femoral arterial system.  There is no previous duplex exam available for comparison.  The patient denies any fever, nausea vomiting.  1. Aneurysm ([place]) - New Patient was found to have multiple aneurysmal sites to the  bilateral femoral and popliteal arteries.  Right popliteal aneurysm measuring 3.21 x 3.29.  Left popliteal aneurysm measuring 6.35 x 5.97. The patient is symptomatic bilaterally.  His discomfort is greater on the left lower extremity when compared to the right.  Recommend a left lower extremity angiogram with possible intervention to assess the actual size of the patient's aneurysm and if appropriate at that time correct them. I recommend following the left lower extremity angiogram with the right lower extremity angiogram. Procedure, risks and benefits explained to the patient. All questions answered. The patient and his daughter who was present during the examination are in agreement.  2. CKD (chronic kidney disease), stage III (HCC) - Stable Encouraged good hypertension control as this directly affects kidney function.  3. Hyperlipidemia, unspecified hyperlipidemia type - Stable Encouraged good control as its slows the progression of  atherosclerotic disease  Current Outpatient Medications on File Prior to Visit  Medication Sig Dispense Refill  . albuterol (PROVENTIL HFA;VENTOLIN HFA) 108 (90 Base) MCG/ACT inhaler Inhale 2 puffs into the lungs every 6 (six) hours as needed for wheezing or shortness of breath. 1 Inhaler 2  . ANORO ELLIPTA 62.5-25 MCG/INH AEPB Inhale 1 puff into the lungs 2 (two) times daily.    . carvedilol (COREG) 3.125 MG tablet Take 1 tablet by mouth 2 (two) times daily.    . furosemide (LASIX) 20 MG tablet Take 1 tablet by mouth daily.    . simvastatin (ZOCOR) 20 MG tablet Take 20 mg by mouth daily.    . traMADol (ULTRAM) 50 MG tablet Take 50 mg by mouth every 8 (eight) hours as needed for moderate pain or severe pain.     . traZODone (DESYREL) 50 MG tablet Take 1 tablet by mouth at bedtime as needed for sleep.      No current facility-administered medications on file prior to visit.    There are no Patient Instructions on file for this visit. No Follow-up on  file.  [place] Lebeau A Taylorann Tkach, PA-C

## 2017-11-15 MED ORDER — CEFAZOLIN SODIUM-DEXTROSE 2-4 GM/100ML-% IV SOLN
2.0000 g | Freq: Once | INTRAVENOUS | Status: AC
  Administered 2017-11-16: 2 g via INTRAVENOUS

## 2017-11-16 ENCOUNTER — Ambulatory Visit
Admission: RE | Admit: 2017-11-16 | Discharge: 2017-11-16 | Disposition: A | Discharge status: Home / Self Care | Payer: Medicare HMO | Source: Ambulatory Visit | Attending: Vascular Surgery | Admitting: Vascular Surgery

## 2017-11-16 ENCOUNTER — Encounter: Payer: Self-pay | Admitting: *Deleted

## 2017-11-16 ENCOUNTER — Encounter
Admission: RE | Disposition: A | Discharge status: Home / Self Care | Payer: Self-pay | Source: Ambulatory Visit | Attending: Vascular Surgery

## 2017-11-16 DIAGNOSIS — I130 Hypertensive heart and chronic kidney disease with heart failure and stage 1 through stage 4 chronic kidney disease, or unspecified chronic kidney disease: Secondary | ICD-10-CM | POA: Insufficient documentation

## 2017-11-16 DIAGNOSIS — N183 Chronic kidney disease, stage 3 (moderate): Secondary | ICD-10-CM | POA: Insufficient documentation

## 2017-11-16 DIAGNOSIS — Z9842 Cataract extraction status, left eye: Secondary | ICD-10-CM | POA: Insufficient documentation

## 2017-11-16 DIAGNOSIS — Z8546 Personal history of malignant neoplasm of prostate: Secondary | ICD-10-CM | POA: Insufficient documentation

## 2017-11-16 DIAGNOSIS — I724 Aneurysm of artery of lower extremity: Secondary | ICD-10-CM | POA: Diagnosis not present

## 2017-11-16 DIAGNOSIS — I429 Cardiomyopathy, unspecified: Secondary | ICD-10-CM | POA: Insufficient documentation

## 2017-11-16 DIAGNOSIS — Z79899 Other long term (current) drug therapy: Secondary | ICD-10-CM | POA: Insufficient documentation

## 2017-11-16 DIAGNOSIS — I4891 Unspecified atrial fibrillation: Secondary | ICD-10-CM | POA: Diagnosis not present

## 2017-11-16 DIAGNOSIS — E785 Hyperlipidemia, unspecified: Secondary | ICD-10-CM | POA: Diagnosis not present

## 2017-11-16 DIAGNOSIS — I70212 Atherosclerosis of native arteries of extremities with intermittent claudication, left leg: Secondary | ICD-10-CM

## 2017-11-16 DIAGNOSIS — Z87891 Personal history of nicotine dependence: Secondary | ICD-10-CM | POA: Diagnosis not present

## 2017-11-16 DIAGNOSIS — I509 Heart failure, unspecified: Secondary | ICD-10-CM | POA: Diagnosis not present

## 2017-11-16 DIAGNOSIS — Z9841 Cataract extraction status, right eye: Secondary | ICD-10-CM | POA: Insufficient documentation

## 2017-11-16 DIAGNOSIS — Z9889 Other specified postprocedural states: Secondary | ICD-10-CM | POA: Insufficient documentation

## 2017-11-16 DIAGNOSIS — Z9079 Acquired absence of other genital organ(s): Secondary | ICD-10-CM | POA: Insufficient documentation

## 2017-11-16 DIAGNOSIS — Z96652 Presence of left artificial knee joint: Secondary | ICD-10-CM | POA: Insufficient documentation

## 2017-11-16 HISTORY — DX: Cardiac arrhythmia, unspecified: I49.9

## 2017-11-16 HISTORY — DX: Peripheral vascular disease, unspecified: I73.9

## 2017-11-16 HISTORY — PX: LOWER EXTREMITY ANGIOGRAPHY: CATH118251

## 2017-11-16 HISTORY — DX: Presence of cardiac pacemaker: Z95.0

## 2017-11-16 SURGERY — LOWER EXTREMITY ANGIOGRAPHY
Anesthesia: Moderate Sedation | Laterality: Left

## 2017-11-16 MED ORDER — ASPIRIN EC 81 MG PO TBEC
81.0000 mg | DELAYED_RELEASE_TABLET | Freq: Every day | ORAL | Status: DC
  Filled 2017-11-16: qty 1

## 2017-11-16 MED ORDER — SODIUM CHLORIDE 0.9 % IV SOLN: INTRAVENOUS | Status: DC

## 2017-11-16 MED ORDER — SODIUM CHLORIDE 0.9 % IV SOLN: 250.0000 mL | INTRAVENOUS | Status: DC | PRN

## 2017-11-16 MED ORDER — ONDANSETRON HCL 4 MG/2ML IJ SOLN: 4.0000 mg | Freq: Four times a day (QID) | INTRAMUSCULAR | Status: DC | PRN

## 2017-11-16 MED ORDER — HEPARIN (PORCINE) IN NACL 2-0.9 UNIT/ML-% IJ SOLN
INTRAMUSCULAR | Status: AC
  Filled 2017-11-16: qty 1000

## 2017-11-16 MED ORDER — DIPHENHYDRAMINE HCL 50 MG/ML IJ SOLN
INTRAMUSCULAR | Status: AC
  Filled 2017-11-16: qty 1

## 2017-11-16 MED ORDER — HYDROMORPHONE HCL 1 MG/ML IJ SOLN: 1.0000 mg | Freq: Once | INTRAMUSCULAR | Status: DC | PRN

## 2017-11-16 MED ORDER — MIDAZOLAM HCL 5 MG/5ML IJ SOLN
INTRAMUSCULAR | Status: AC
Start: 2017-11-16 — End: 2017-11-16
  Filled 2017-11-16: qty 5

## 2017-11-16 MED ORDER — SODIUM CHLORIDE 0.9% FLUSH: 3.0000 mL | Freq: Two times a day (BID) | INTRAVENOUS | Status: DC

## 2017-11-16 MED ORDER — LABETALOL HCL 5 MG/ML IV SOLN
INTRAVENOUS | Status: AC
  Filled 2017-11-16: qty 4

## 2017-11-16 MED ORDER — FENTANYL CITRATE (PF) 100 MCG/2ML IJ SOLN
INTRAMUSCULAR | Status: AC
  Filled 2017-11-16: qty 2

## 2017-11-16 MED ORDER — LABETALOL HCL 5 MG/ML IV SOLN
10.0000 mg | INTRAVENOUS | Status: DC | PRN
  Administered 2017-11-16: 14:00:00 via INTRAVENOUS

## 2017-11-16 MED ORDER — LIDOCAINE-EPINEPHRINE (PF) 1 %-1:200000 IJ SOLN
INTRAMUSCULAR | Status: AC
  Filled 2017-11-16: qty 30

## 2017-11-16 MED ORDER — HEPARIN SODIUM (PORCINE) 1000 UNIT/ML IJ SOLN
INTRAMUSCULAR | Status: DC | PRN
  Administered 2017-11-16: 4000 [IU] via INTRAVENOUS

## 2017-11-16 MED ORDER — DIPHENHYDRAMINE HCL 50 MG/ML IJ SOLN
INTRAMUSCULAR | Status: DC | PRN
  Administered 2017-11-16: 25 mg via INTRAVENOUS

## 2017-11-16 MED ORDER — FAMOTIDINE 20 MG PO TABS: 40.0000 mg | ORAL_TABLET | ORAL | Status: DC | PRN

## 2017-11-16 MED ORDER — FENTANYL CITRATE (PF) 100 MCG/2ML IJ SOLN
INTRAMUSCULAR | Status: DC | PRN
  Administered 2017-11-16 (×4): 50 ug via INTRAVENOUS

## 2017-11-16 MED ORDER — METHYLPREDNISOLONE SODIUM SUCC 125 MG IJ SOLR: 125.0000 mg | INTRAMUSCULAR | Status: DC | PRN

## 2017-11-16 MED ORDER — HEPARIN SODIUM (PORCINE) 1000 UNIT/ML IJ SOLN
INTRAMUSCULAR | Status: AC
  Filled 2017-11-16: qty 1

## 2017-11-16 MED ORDER — HYDRALAZINE HCL 20 MG/ML IJ SOLN: 5.0000 mg | INTRAMUSCULAR | Status: DC | PRN

## 2017-11-16 MED ORDER — MIDAZOLAM HCL 2 MG/2ML IJ SOLN
INTRAMUSCULAR | Status: DC | PRN
  Administered 2017-11-16 (×2): 2 mg via INTRAVENOUS
  Administered 2017-11-16: 1 mg via INTRAVENOUS

## 2017-11-16 MED ORDER — SODIUM CHLORIDE 0.9% FLUSH: 3.0000 mL | INTRAVENOUS | Status: DC | PRN

## 2017-11-16 MED ORDER — LIDOCAINE-EPINEPHRINE (PF) 1 %-1:200000 IJ SOLN
INTRAMUSCULAR | Status: DC | PRN
  Administered 2017-11-16: 10 mL via INTRADERMAL

## 2017-11-16 MED ORDER — IOPAMIDOL (ISOVUE-300) INJECTION 61%
INTRAVENOUS | Status: DC | PRN
  Administered 2017-11-16: 75 mL via INTRAVENOUS

## 2017-11-16 MED ORDER — SODIUM CHLORIDE 0.9 % IV SOLN
INTRAVENOUS | Status: DC
  Administered 2017-11-16: 10:00:00 via INTRAVENOUS

## 2017-11-16 SURGICAL SUPPLY — 13 items
CATH BEACON 5 .038 100 VERT TP (CATHETERS) ×2 IMPLANT
CATH CXI SUPP ANG 4FR 135 (MICROCATHETER) ×1 IMPLANT
CATH CXI SUPP ANG 4FR 135CM (MICROCATHETER) ×2
CATH PIG 70CM (CATHETERS) ×2 IMPLANT
DEVICE STARCLOSE SE CLOSURE (Vascular Products) ×2 IMPLANT
DEVICE TORQUE (MISCELLANEOUS) ×2 IMPLANT
GLIDEWIRE ADV .035X180CM (WIRE) ×2 IMPLANT
GUIDEWIRE PFTE-COATED .018X300 (WIRE) ×2 IMPLANT
PACK ANGIOGRAPHY (CUSTOM PROCEDURE TRAY) ×2 IMPLANT
SHEATH BRITE TIP 5FRX11 (SHEATH) ×2 IMPLANT
SHEATH BRITE TIP 6FRX11 (SHEATH) ×2 IMPLANT
TUBING CONTRAST HIGH PRESS 72 (TUBING) ×2 IMPLANT
WIRE J 3MM .035X145CM (WIRE) ×2 IMPLANT

## 2017-11-16 NOTE — H&P (Signed)
[facility] History & Physical Update  The patient was interviewed and re-examined.  The patient's previous History and Physical has been reviewed and is unchanged.  There is no change in the plan of care. We plan to proceed with the scheduled procedure.  Leotis Pain, MD  11/16/2017, 8:52 AM

## 2017-11-16 NOTE — Op Note (Signed)
[facility] Percutaneous Study/Intervention Procedural Note   Date of Surgery: 11/16/2017  Surgeon(s):DEW,JASON   Assistants:none  Pre-operative Diagnosis: Bilateral popliteal artery aneurysms  Post-operative diagnosis: Same  Procedure(s) Performed: 1. Ultrasound guidance for vascular access right femoral artery 2. Catheter placement into left popliteal artery from right femoral approach 3. Aortogram and selective bilateral lower extremity angiogram 4. StarClose closure device right femoral artery  EBL: 5 cc  Contrast: 75 cc  Fluoro Time: 14.8 minutes  Moderate Conscious Sedation Time: approximately 50 minutes using 5 mg of Versed and 200 Mcg of Fentanyl  Indications: Patient is a 83 y.o.male with symptomatic bilateral popliteal aneurysms which are extremely large in size. The patient has noninvasive study showing popliteal and SFA aneurysms bilaterally with the largest diameter being about 5-6 cm bigger on the left than the right. The patient is brought in for angiography for further evaluation and potential treatment. Risks and benefits are discussed and informed consent is obtained  Procedure: The patient was identified and appropriate procedural time out was performed. The patient was then placed supine on the table and prepped and draped in the usual sterile fashion.Moderate conscious sedation was administered during a face to face encounter with the patient throughout the procedure with my supervision of the RN administering medicines and monitoring the patient's vital signs, pulse oximetry, telemetry and mental status throughout from the start of the procedure until the patient was taken to the recovery room. Ultrasound was used to evaluate the right common femoral artery. It was patent . A digital ultrasound image was acquired. A Seldinger needle was used to access the right common  femoral artery under direct ultrasound guidance and a permanent image was performed. A 0.035 J wire was advanced without resistance and a 5Fr sheath was placed. Pigtail catheter was placed into the aorta and an AP aortogram was performed. This demonstrated that the renal arteries appeared patent.  Aorta was basically normal.  Aneurysmal iliac arteries bigger in the right common iliac artery than the left. I then crossed the aortic bifurcation and advanced to the left femoral head. Selective left lower extremity angiogram was then performed. This demonstrated diffusely aneurysmal superficial femoral artery although not massively so with intervening areas of normal to slightly narrowed artery.  On initial imaging, the extremely large popliteal aneurysm was only partially identified and had to advance a catheter down to the popliteal artery to opacify the distal vessels.  The popliteal artery was incredibly enlarged and due to the tortuosity the distal popliteal artery that normalized in size was actually pointing cephalad and then turned down into the tibioperoneal trunk with a 180 degree angle after already having made a 180 degree angle in the distal popliteal artery.  The peroneal artery was the only runoff distally with occlusion of the posterior tibial and anterior tibial arteries. The patient was systemically heparinized. I then used a Kumpe catheter and the advantage wire to get down into the popliteal aneurysm and try to get down into the peroneal artery.  The Kumpe catheter was hubbed out, so I exchanged for a 135 cm CXI catheter.  I used both the 0.018 advantage wire as well as 0.035 advantage wire and could get wire access into the tibial vessels but could never get the catheter to make the dramatic returns and get down into the tibial vessels.  I did not think a stent would have any chance of crossing into the tibioperoneal trunk which would need to be the landing zone and  I would think there would be a  very high risk of thrombosis of the stent with the kinking and tortuosity that could be created.  I elected to cease any attempts at endovascular therapy of his left popliteal aneurysm at this time, and would consider an open surgical repair going forward.  I did want to assess the right lower extremity so I tried imaging extremity through the right femoral sheath.  He was already scheduled for an attempt at a popliteal aneurysm repair on the right, and if prohibitive anatomy was to be seen this could be canceled. The right leg imaging demonstrated a diffusely enlarged common femoral artery, superficial femoral artery which was mild to moderately aneurysmal with multiple intervening segments down to the popliteal artery aneurysm.  This was not very well opacified from the femoral sheath, but appeared to be less tortuous than the left popliteal aneurysm.  The distal runoff was not seen on these images through the right femoral sheath even with delayed imaging.  I felt at this point we would have to come back with more distal catheter placement to see whether or not this would be amenable to endovascular therapy, and we would keep our scheduled procedure for later in the month on the right leg. I elected to terminate the procedure. The sheath was removed and StarClose closure device was deployed in the right femoral artery with excellent hemostatic result. The patient was taken to the recovery room in stable condition having tolerated the procedure well.  Findings:  Aortogram:Renal arteries appeared patent.  Aorta was basically normal.  Aneurysmal iliac arteries bigger in the right common iliac artery than the left. Left lower Extremity: This demonstrated diffusely aneurysmal superficial femoral artery although not massively so with intervening areas of normal to slightly narrowed artery.  On initial imaging, the extremely large popliteal aneurysm was only partially identified and had to  advance a catheter down to the popliteal artery to opacify the distal vessels.  The popliteal artery was incredibly enlarged and due to the tortuosity the distal popliteal artery that normalized in size was actually pointing cephalad and then turned down into the tibioperoneal trunk with a 180 degree angle after already having made a 180 degree angle in the distal popliteal artery.  The peroneal artery was the only runoff distally with occlusion of the posterior tibial and anterior tibial arteries.  Right lower extremity: Diffusely enlarged common femoral artery, superficial femoral artery which was mild to moderately aneurysmal with multiple intervening segments down to the popliteal artery aneurysm.  This was not very well opacified from the femoral sheath, but appeared to be less tortuous than the left popliteal aneurysm.  The distal runoff was not seen on these images through the right femoral sheath even with delayed imaging.   Disposition: Patient was taken to the recovery room in stable condition having tolerated the procedure well.  Complications: None  Devin Washington 11/16/2017 11:53 AM   This note was created with Dragon Medical transcription system. Any errors in dictation are purely unintentional.

## 2017-11-16 NOTE — Discharge Instructions (Signed)

## 2017-11-16 NOTE — OR Nursing (Signed)
Assisting patient into car for discharge home when family noticed bright red blood on pants at right groin...back to SPR patient laid in bed ...pants removed. Pressure held to groin for 15 min, and redressed..macular degeneration aware and out to see. New orders given

## 2017-11-17 ENCOUNTER — Encounter: Payer: Self-pay | Admitting: Vascular Surgery

## 2017-11-19 DIAGNOSIS — I5032 Chronic diastolic (congestive) heart failure: Secondary | ICD-10-CM | POA: Diagnosis not present

## 2017-11-19 DIAGNOSIS — R600 Localized edema: Secondary | ICD-10-CM | POA: Diagnosis not present

## 2017-11-19 DIAGNOSIS — N183 Chronic kidney disease, stage 3 (moderate): Secondary | ICD-10-CM | POA: Diagnosis not present

## 2017-11-19 DIAGNOSIS — E7849 Other hyperlipidemia: Secondary | ICD-10-CM | POA: Diagnosis not present

## 2017-11-19 DIAGNOSIS — I10 Essential (primary) hypertension: Secondary | ICD-10-CM | POA: Diagnosis not present

## 2017-11-19 DIAGNOSIS — R0602 Shortness of breath: Secondary | ICD-10-CM | POA: Diagnosis not present

## 2017-11-19 DIAGNOSIS — I429 Cardiomyopathy, unspecified: Secondary | ICD-10-CM | POA: Diagnosis not present

## 2017-11-19 DIAGNOSIS — R011 Cardiac murmur, unspecified: Secondary | ICD-10-CM | POA: Diagnosis not present

## 2017-11-19 DIAGNOSIS — I480 Paroxysmal atrial fibrillation: Secondary | ICD-10-CM | POA: Diagnosis not present

## 2017-11-19 DIAGNOSIS — I495 Sick sinus syndrome: Secondary | ICD-10-CM | POA: Diagnosis not present

## 2017-11-20 ENCOUNTER — Encounter (INDEPENDENT_AMBULATORY_CARE_PROVIDER_SITE_OTHER): Payer: Self-pay

## 2017-11-24 ENCOUNTER — Other Ambulatory Visit (INDEPENDENT_AMBULATORY_CARE_PROVIDER_SITE_OTHER): Payer: Self-pay | Admitting: Vascular Surgery

## 2017-11-27 ENCOUNTER — Other Ambulatory Visit: Payer: Self-pay

## 2017-11-27 ENCOUNTER — Inpatient Hospital Stay: Admission: RE | Admit: 2017-11-27 | Payer: Medicare HMO | Source: Ambulatory Visit

## 2017-11-27 ENCOUNTER — Encounter
Admission: RE | Admit: 2017-11-27 | Discharge: 2017-11-27 | Disposition: A | Discharge status: Home / Self Care | Payer: Medicare HMO | Source: Ambulatory Visit | Attending: Vascular Surgery | Admitting: Vascular Surgery

## 2017-11-27 DIAGNOSIS — Z9841 Cataract extraction status, right eye: Secondary | ICD-10-CM | POA: Diagnosis not present

## 2017-11-27 DIAGNOSIS — I724 Aneurysm of artery of lower extremity: Secondary | ICD-10-CM | POA: Diagnosis present

## 2017-11-27 DIAGNOSIS — E785 Hyperlipidemia, unspecified: Secondary | ICD-10-CM | POA: Diagnosis not present

## 2017-11-27 DIAGNOSIS — I4891 Unspecified atrial fibrillation: Secondary | ICD-10-CM | POA: Diagnosis not present

## 2017-11-27 DIAGNOSIS — I495 Sick sinus syndrome: Secondary | ICD-10-CM | POA: Diagnosis not present

## 2017-11-27 DIAGNOSIS — Z8546 Personal history of malignant neoplasm of prostate: Secondary | ICD-10-CM | POA: Diagnosis not present

## 2017-11-27 DIAGNOSIS — Z9889 Other specified postprocedural states: Secondary | ICD-10-CM | POA: Diagnosis not present

## 2017-11-27 DIAGNOSIS — Z9079 Acquired absence of other genital organ(s): Secondary | ICD-10-CM | POA: Diagnosis not present

## 2017-11-27 DIAGNOSIS — I509 Heart failure, unspecified: Secondary | ICD-10-CM | POA: Diagnosis not present

## 2017-11-27 DIAGNOSIS — Z8711 Personal history of peptic ulcer disease: Secondary | ICD-10-CM | POA: Diagnosis not present

## 2017-11-27 DIAGNOSIS — Z96652 Presence of left artificial knee joint: Secondary | ICD-10-CM | POA: Diagnosis not present

## 2017-11-27 DIAGNOSIS — Z9842 Cataract extraction status, left eye: Secondary | ICD-10-CM | POA: Diagnosis not present

## 2017-11-27 DIAGNOSIS — Z79899 Other long term (current) drug therapy: Secondary | ICD-10-CM | POA: Diagnosis not present

## 2017-11-27 DIAGNOSIS — I10 Essential (primary) hypertension: Secondary | ICD-10-CM | POA: Diagnosis not present

## 2017-11-27 DIAGNOSIS — Z87891 Personal history of nicotine dependence: Secondary | ICD-10-CM | POA: Diagnosis not present

## 2017-11-27 DIAGNOSIS — I130 Hypertensive heart and chronic kidney disease with heart failure and stage 1 through stage 4 chronic kidney disease, or unspecified chronic kidney disease: Secondary | ICD-10-CM | POA: Diagnosis not present

## 2017-11-27 DIAGNOSIS — N183 Chronic kidney disease, stage 3 (moderate): Secondary | ICD-10-CM | POA: Diagnosis not present

## 2017-11-27 LAB — CBC WITH DIFFERENTIAL/PLATELET
BASOS ABS: 0.1 10*3/uL (ref 0–0.1)
BASOS PCT: 1 %
Eosinophils Absolute: 0.2 10*3/uL (ref 0–0.7)
Eosinophils Relative: 2 %
HEMATOCRIT: 30.6 % — AB (ref 40.0–52.0)
Hemoglobin: 10.3 g/dL — ABNORMAL LOW (ref 13.0–18.0)
LYMPHS PCT: 13 %
Lymphs Abs: 1.4 10*3/uL (ref 1.0–3.6)
MCH: 27.8 pg (ref 26.0–34.0)
MCHC: 33.7 g/dL (ref 32.0–36.0)
MCV: 82.4 fL (ref 80.0–100.0)
Monocytes Absolute: 0.8 10*3/uL (ref 0.2–1.0)
Monocytes Relative: 8 %
NEUTROS ABS: 8.1 10*3/uL — AB (ref 1.4–6.5)
Neutrophils Relative %: 76 %
PLATELETS: 231 10*3/uL (ref 150–440)
RBC: 3.71 MIL/uL — AB (ref 4.40–5.90)
RDW: 16.6 % — ABNORMAL HIGH (ref 11.5–14.5)
WBC: 10.5 10*3/uL (ref 3.8–10.6)

## 2017-11-27 LAB — SURGICAL PCR SCREEN
MRSA, PCR: NEGATIVE
STAPHYLOCOCCUS AUREUS: NEGATIVE

## 2017-11-27 LAB — BASIC METABOLIC PANEL
ANION GAP: 10 (ref 5–15)
BUN: 21 mg/dL — ABNORMAL HIGH (ref 6–20)
CALCIUM: 9.6 mg/dL (ref 8.9–10.3)
CO2: 27 mmol/L (ref 22–32)
Chloride: 99 mmol/L — ABNORMAL LOW (ref 101–111)
Creatinine, Ser: 1.55 mg/dL — ABNORMAL HIGH (ref 0.61–1.24)
GFR, EST AFRICAN AMERICAN: 43 mL/min — AB (ref >60)
GFR, EST NON AFRICAN AMERICAN: 37 mL/min — AB (ref >60)
Glucose, Bld: 107 mg/dL — ABNORMAL HIGH (ref 65–99)
POTASSIUM: 3.7 mmol/L (ref 3.5–5.1)
SODIUM: 136 mmol/L (ref 135–145)

## 2017-11-27 LAB — PROTIME-INR
INR: 1.02
Prothrombin Time: 13.3 seconds (ref 11.4–15.2)

## 2017-11-27 LAB — APTT: APTT: 33 s (ref 24–36)

## 2017-11-27 NOTE — Patient Instructions (Signed)
[facility]  Your procedure is scheduled with Dr. Lucky Cowboy on Monday, December 17   -  RIGHT LOWER EXTREMITY ANGIOGRAM  FOLLOW INSTRUCTIONS GIVEN TO YOU BY DR. Lucky Cowboy.                      On arrival go [facility] on first floor of the [facility].  Please call Dr Lucky Cowboy and Dr Nino Parsley office with any questions or concerns: [phone].  You will need to have someone drive you home and stay with you the night of the procedure.  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 To find out your arrival time, please call [phone] between 1PM - 3PM on: Monday, December 14, 2017  REMEMBER: Instructions that are not followed completely may result in serious medical risk, up to and including death; or upon the discretion of your surgeon and anesthesiologist your surgery may need to be rescheduled.  Do not eat food after midnight the night before your procedure.  No gum chewing or hard candies.  You may however, drink CLEAR liquids up to 2 hours before you are scheduled to arrive at the hospital for your procedure.  Do not drink clear liquids within 2 hours of the start of your surgery.  Clear liquids include: - water  - apple juice without pulp - clear gatorade - black coffee or tea (Do NOT add anything to the coffee or tea) Do NOT drink anything that is not on this list.  No Alcohol for 24 hours before or after surgery.  No Smoking including e-cigarettes for 24 hours prior to surgery. No chewable tobacco products for at least 6 hours prior to surgery. No nicotine patches on the day of surgery.  Notify your doctor if there is any change in your medical condition (cold, fever, infection).  Do  not wear jewelry, make-up, hairpins, clips or nail polish.  Do not wear lotions, powders, or perfumes. You may wear deodorant.  Do not shave 48 hours prior to surgery. Men may shave face and neck.  Contacts and dentures may not be worn into surgery.  Do not bring valuables to the hospital. [facility] is not responsible for any belongings or valuables.   TAKE THESE MEDICATIONS THE MORNING OF SURGERY WITH A SIP OF WATER:  1.  ANORO ELLIPTA INHALER 2.  CARVEDILOL 3.  PANTOPRAZOLE (take one tablet the night before surgery and one tablet the morning of surgery) 4.  TRAMADOL (if needed for pain)  Use CHG Soap as directed on instruction sheet.  On December 23 - Stop Anti-inflammatories such as Advil, Aleve, Ibuprofen, Motrin, Naproxen, Naprosyn, Goodie powder, or aspirin products. (May take Tylenol or Acetaminophen if needed.)  On December 23 - Stop ANY OVER THE COUNTER supplements until after surgery.   If you are being admitted to the hospital overnight, leave your suitcase in the car. After surgery it may be brought to your room.  Please call the number above if you have any questions about these instructions.

## 2017-11-29 MED ORDER — CEFAZOLIN SODIUM-DEXTROSE 2-4 GM/100ML-% IV SOLN
2.0000 g | Freq: Once | INTRAVENOUS | Status: AC
  Administered 2017-11-30: 2 g via INTRAVENOUS

## 2017-11-30 ENCOUNTER — Encounter
Admission: RE | Disposition: A | Discharge status: Home / Self Care | Payer: Self-pay | Source: Ambulatory Visit | Attending: Vascular Surgery

## 2017-11-30 ENCOUNTER — Encounter: Payer: Self-pay | Admitting: *Deleted

## 2017-11-30 ENCOUNTER — Ambulatory Visit
Admission: RE | Admit: 2017-11-30 | Discharge: 2017-11-30 | Disposition: A | Discharge status: Home / Self Care | Payer: Medicare HMO | Source: Ambulatory Visit | Attending: Vascular Surgery | Admitting: Vascular Surgery

## 2017-11-30 DIAGNOSIS — I724 Aneurysm of artery of lower extremity: Secondary | ICD-10-CM | POA: Insufficient documentation

## 2017-11-30 DIAGNOSIS — Z87891 Personal history of nicotine dependence: Secondary | ICD-10-CM | POA: Insufficient documentation

## 2017-11-30 DIAGNOSIS — N183 Chronic kidney disease, stage 3 (moderate): Secondary | ICD-10-CM | POA: Insufficient documentation

## 2017-11-30 DIAGNOSIS — I509 Heart failure, unspecified: Secondary | ICD-10-CM | POA: Insufficient documentation

## 2017-11-30 DIAGNOSIS — I4891 Unspecified atrial fibrillation: Secondary | ICD-10-CM | POA: Insufficient documentation

## 2017-11-30 DIAGNOSIS — Z9841 Cataract extraction status, right eye: Secondary | ICD-10-CM | POA: Insufficient documentation

## 2017-11-30 DIAGNOSIS — I70211 Atherosclerosis of native arteries of extremities with intermittent claudication, right leg: Secondary | ICD-10-CM

## 2017-11-30 DIAGNOSIS — Z9079 Acquired absence of other genital organ(s): Secondary | ICD-10-CM | POA: Insufficient documentation

## 2017-11-30 DIAGNOSIS — Z96652 Presence of left artificial knee joint: Secondary | ICD-10-CM | POA: Insufficient documentation

## 2017-11-30 DIAGNOSIS — Z9889 Other specified postprocedural states: Secondary | ICD-10-CM | POA: Insufficient documentation

## 2017-11-30 DIAGNOSIS — E785 Hyperlipidemia, unspecified: Secondary | ICD-10-CM | POA: Insufficient documentation

## 2017-11-30 DIAGNOSIS — I130 Hypertensive heart and chronic kidney disease with heart failure and stage 1 through stage 4 chronic kidney disease, or unspecified chronic kidney disease: Secondary | ICD-10-CM | POA: Diagnosis not present

## 2017-11-30 DIAGNOSIS — Z8546 Personal history of malignant neoplasm of prostate: Secondary | ICD-10-CM | POA: Insufficient documentation

## 2017-11-30 DIAGNOSIS — I495 Sick sinus syndrome: Secondary | ICD-10-CM | POA: Insufficient documentation

## 2017-11-30 DIAGNOSIS — Z8711 Personal history of peptic ulcer disease: Secondary | ICD-10-CM | POA: Diagnosis not present

## 2017-11-30 DIAGNOSIS — Z79899 Other long term (current) drug therapy: Secondary | ICD-10-CM | POA: Diagnosis not present

## 2017-11-30 DIAGNOSIS — Z9842 Cataract extraction status, left eye: Secondary | ICD-10-CM | POA: Insufficient documentation

## 2017-11-30 HISTORY — PX: LOWER EXTREMITY ANGIOGRAPHY: CATH118251

## 2017-11-30 SURGERY — LOWER EXTREMITY ANGIOGRAPHY
Anesthesia: Moderate Sedation | Laterality: Right

## 2017-11-30 MED ORDER — SODIUM CHLORIDE 0.9 % IV SOLN
INTRAVENOUS | Status: DC
  Administered 2017-11-30: 07:00:00 via INTRAVENOUS

## 2017-11-30 MED ORDER — SODIUM CHLORIDE 0.9% FLUSH: 3.0000 mL | INTRAVENOUS | Status: DC | PRN

## 2017-11-30 MED ORDER — ONDANSETRON HCL 4 MG/2ML IJ SOLN: 4.0000 mg | Freq: Four times a day (QID) | INTRAMUSCULAR | Status: DC | PRN

## 2017-11-30 MED ORDER — MIDAZOLAM HCL 2 MG/2ML IJ SOLN
INTRAMUSCULAR | Status: DC | PRN
  Administered 2017-11-30: 1 mg via INTRAVENOUS
  Administered 2017-11-30: 0.5 mg via INTRAVENOUS
  Administered 2017-11-30 (×2): 1 mg via INTRAVENOUS
  Administered 2017-11-30: 0.5 mg via INTRAVENOUS
  Administered 2017-11-30: 2 mg via INTRAVENOUS
  Administered 2017-11-30 (×2): 1 mg via INTRAVENOUS

## 2017-11-30 MED ORDER — FENTANYL CITRATE (PF) 100 MCG/2ML IJ SOLN
INTRAMUSCULAR | Status: AC
Start: 2017-11-30 — End: ?
  Filled 2017-11-30: qty 2

## 2017-11-30 MED ORDER — ASPIRIN EC 81 MG PO TBEC
81.0000 mg | DELAYED_RELEASE_TABLET | Freq: Every day | ORAL | Status: DC
  Filled 2017-11-30: qty 1

## 2017-11-30 MED ORDER — FAMOTIDINE 20 MG PO TABS: 40.0000 mg | ORAL_TABLET | ORAL | Status: DC | PRN

## 2017-11-30 MED ORDER — METHYLPREDNISOLONE SODIUM SUCC 125 MG IJ SOLR: 125.0000 mg | INTRAMUSCULAR | Status: DC | PRN

## 2017-11-30 MED ORDER — CEFAZOLIN SODIUM-DEXTROSE 2-4 GM/100ML-% IV SOLN
INTRAVENOUS | Status: DC
Start: 2017-11-30 — End: 2017-11-30
  Filled 2017-11-30: qty 100

## 2017-11-30 MED ORDER — FENTANYL CITRATE (PF) 100 MCG/2ML IJ SOLN
INTRAMUSCULAR | Status: DC | PRN
  Administered 2017-11-30 (×2): 25 ug via INTRAVENOUS
  Administered 2017-11-30: 12.5 ug via INTRAVENOUS
  Administered 2017-11-30 (×3): 25 ug via INTRAVENOUS
  Administered 2017-11-30: 12.5 ug via INTRAVENOUS
  Administered 2017-11-30: 50 ug via INTRAVENOUS

## 2017-11-30 MED ORDER — HYDRALAZINE HCL 20 MG/ML IJ SOLN: 5.0000 mg | INTRAMUSCULAR | Status: DC | PRN

## 2017-11-30 MED ORDER — CLOPIDOGREL BISULFATE 75 MG PO TABS: 75.0000 mg | ORAL_TABLET | Freq: Every day | ORAL | Status: DC

## 2017-11-30 MED ORDER — HEPARIN SODIUM (PORCINE) 1000 UNIT/ML IJ SOLN
INTRAMUSCULAR | Status: DC | PRN
  Administered 2017-11-30 (×2): 2500 [IU] via INTRAVENOUS

## 2017-11-30 MED ORDER — FENTANYL CITRATE (PF) 100 MCG/2ML IJ SOLN
INTRAMUSCULAR | Status: AC
  Filled 2017-11-30: qty 2

## 2017-11-30 MED ORDER — MIDAZOLAM HCL 5 MG/5ML IJ SOLN
INTRAMUSCULAR | Status: AC
  Filled 2017-11-30: qty 5

## 2017-11-30 MED ORDER — HEPARIN SODIUM (PORCINE) 1000 UNIT/ML IJ SOLN
INTRAMUSCULAR | Status: AC
  Filled 2017-11-30: qty 1

## 2017-11-30 MED ORDER — MIDAZOLAM HCL 2 MG/2ML IJ SOLN
INTRAMUSCULAR | Status: AC
  Filled 2017-11-30: qty 2

## 2017-11-30 MED ORDER — HYDROMORPHONE HCL 1 MG/ML IJ SOLN: 1.0000 mg | Freq: Once | INTRAMUSCULAR | Status: DC | PRN

## 2017-11-30 MED ORDER — HEPARIN (PORCINE) IN NACL 2-0.9 UNIT/ML-% IJ SOLN
INTRAMUSCULAR | Status: AC
  Filled 2017-11-30: qty 1000

## 2017-11-30 MED ORDER — SODIUM CHLORIDE 0.9% FLUSH: 3.0000 mL | Freq: Two times a day (BID) | INTRAVENOUS | Status: DC

## 2017-11-30 MED ORDER — LIDOCAINE-EPINEPHRINE (PF) 1 %-1:200000 IJ SOLN
INTRAMUSCULAR | Status: AC
  Filled 2017-11-30: qty 30

## 2017-11-30 MED ORDER — SODIUM CHLORIDE 0.9 % IV SOLN: INTRAVENOUS | Status: DC

## 2017-11-30 MED ORDER — SODIUM CHLORIDE 0.9 % IV SOLN: 250.0000 mL | INTRAVENOUS | Status: DC | PRN

## 2017-11-30 MED ORDER — LABETALOL HCL 5 MG/ML IV SOLN: 10.0000 mg | INTRAVENOUS | Status: DC | PRN

## 2017-11-30 SURGICAL SUPPLY — 27 items
BALLN ULTRVRSE 8X200X130 (BALLOONS) ×2
BALLOON ULTRVRSE 8X200X130 (BALLOONS) ×1 IMPLANT
CATH BEACON 5 .035 65 RIM TIP (CATHETERS) ×2 IMPLANT
CATH BEACON 5 .038 100 VERT TP (CATHETERS) ×2 IMPLANT
CATH BERNSTEIN 5FR 130CM (CATHETERS) ×2 IMPLANT
CATH CXI SUPP ANG 4FR 135 (MICROCATHETER) ×1 IMPLANT
CATH CXI SUPP ANG 4FR 135CM (MICROCATHETER) ×2
DERMABOND ADVANCED (GAUZE/BANDAGES/DRESSINGS) ×1
DERMABOND ADVANCED .7 DNX12 (GAUZE/BANDAGES/DRESSINGS) ×1 IMPLANT
DEVICE CLOSURE PERCLS PRGLD 6F (VASCULAR PRODUCTS) ×4 IMPLANT
DEVICE PRESTO INFLATION (MISCELLANEOUS) ×2 IMPLANT
DEVICE TORQUE (MISCELLANEOUS) ×2 IMPLANT
GLIDEWIRE ADV .035X260CM (WIRE) ×2 IMPLANT
GUIDEWIRE PFTE-COATED .018X300 (WIRE) ×2 IMPLANT
PACK ANGIOGRAPHY (CUSTOM PROCEDURE TRAY) ×2 IMPLANT
PERCLOSE PROGLIDE 6F (VASCULAR PRODUCTS) ×8
SHEATH ANSEL 9 FR 45CM (SHEATH) ×2 IMPLANT
SHEATH BRITE TIP 5FRX11 (SHEATH) ×2 IMPLANT
SHEATH BRITE TIP 8FRX11 (SHEATH) ×2 IMPLANT
SHEATH DESTINATION 8FR 90CM (SHEATH) ×2 IMPLANT
STENT VIABAHN 8X250X120 (Permanent Stent) ×2 IMPLANT
STENT VIABAHN 9X150X120 (Permanent Stent) ×2 IMPLANT
SUT MNCRL AB 4-0 PS2 18 (SUTURE) ×2 IMPLANT
VALVE CHECKFLO PERFORMER (SHEATH) ×2 IMPLANT
WIRE G V18X300CM (WIRE) ×2 IMPLANT
WIRE HI TORQ STEELCORE18 300CM (WIRE) ×4 IMPLANT
WIRE J 3MM .035X145CM (WIRE) ×2 IMPLANT

## 2017-11-30 NOTE — OR Nursing (Signed)
Dr Lucky Cowboy reported pt not to start Plavix due to upcoming bypass surgery. He instructed pt and Granddaughter to have pt take 81 mg aspirin daily

## 2017-11-30 NOTE — Discharge Instructions (Signed)
Angiogram, Care After °This sheet gives you information about how to care for yourself after your procedure. Your health care provider may also give you more specific instructions. If you have problems or questions, contact your health care provider. °What can I expect after the procedure? °After the procedure, it is common to have bruising and tenderness at the catheter insertion area. °Follow these instructions at home: °Insertion site care  °· Follow instructions from your health care provider about how to take care of your insertion site. Make sure you: °¨ Wash your hands with soap and water before you change your bandage (dressing). If soap and water are not available, use hand sanitizer. °¨ Change your dressing as told by your health care provider. °¨ Leave stitches (sutures), skin glue, or adhesive strips in place. These skin closures may need to stay in place for 2 weeks or longer. If adhesive strip edges start to loosen and curl up, you may trim the loose edges. Do not remove adhesive strips completely unless your health care provider tells you to do that. °· Do not take baths, swim, or use a hot tub until your health care provider approves. °· You may shower 24-48 hours after the procedure or as told by your health care provider. °¨ Gently wash the site with plain soap and water. °¨ Pat the area dry with a clean towel. °¨ Do not rub the site. This may cause bleeding. °· Do not apply powder or lotion to the site. Keep the site clean and dry. °· Check your insertion site every day for signs of infection. Check for: °¨ Redness, swelling, or pain. °¨ Fluid or blood. °¨ Warmth. °¨ Pus or a bad smell. °Activity  °· Rest as told by your health care provider, usually for 1-2 days. °· Do not lift anything that is heavier than 10 lbs. (4.5 kg) or as told by your health care provider. °· Do not drive for 24 hours if you were given a medicine to help you relax (sedative). °· Do not drive or use heavy machinery while  taking prescription pain medicine. °General instructions  °· Return to your normal activities as told by your health care provider, usually in about a week. Ask your health care provider what activities are safe for you. °· If the catheter site starts bleeding, lie flat and put pressure on the site. If the bleeding does not stop, get help right away. This is a medical emergency. °· Drink enough fluid to keep your urine clear or pale yellow. This helps flush the contrast dye from your body. °· Take over-the-counter and prescription medicines only as told by your health care provider. °· Keep all follow-up visits as told by your health care provider. This is important. °Contact a health care provider if: °· You have a fever or chills. °· You have redness, swelling, or pain around your insertion site. °· You have fluid or blood coming from your insertion site. °· The insertion site feels warm to the touch. °· You have pus or a bad smell coming from your insertion site. °· You have bruising around the insertion site. °· You notice blood collecting in the tissue around the catheter site (hematoma). The hematoma may be painful to the touch. °Get help right away if: °· You have severe pain at the catheter insertion area. °· The catheter insertion area swells very fast. °· The catheter insertion area is bleeding, and the bleeding does not stop when you hold steady pressure on   the area. °· The area near or just beyond the catheter insertion site becomes pale, cool, tingly, or numb. °These symptoms may represent a serious problem that is an emergency. Do not wait to see if the symptoms will go away. Get medical help right away. Call your local emergency services (911 in the [place] Do not drive yourself to the hospital. °Summary °· After the procedure, it is common to have bruising and tenderness at the catheter insertion area. °· After the procedure, it is important to rest and drink plenty of fluids. °· Do not take baths,  swim, or use a hot tub until your health care provider says it is okay to do so. You may shower 24-48 hours after the procedure or as told by your health care provider. °· If the catheter site starts bleeding, lie flat and put pressure on the site. If the bleeding does not stop, get help right away. This is a medical emergency. °This information is not intended to replace advice given to you by your health care provider. Make sure you discuss any questions you have with your health care provider. °Document Released: 06/19/2005 Document Revised: 11/05/2016 Document Reviewed: 11/05/2016 °Elsevier Interactive Patient Education © 2017 [employer] ° °

## 2017-11-30 NOTE — Op Note (Signed)
[facility] Percutaneous Study/Intervention Procedural Note   Date of Surgery: 11/30/2017  Surgeon(s):DEW,JASON   Assistants:none  Pre-operative Diagnosis: Popliteal aneurysms bilaterally  Post-operative diagnosis: Same  Procedure(s) Performed: 1. Ultrasound guidance for vascular access left femoral artery 2. Catheter placement into right peroneal artery from left femoral approach 3. Selective right lower extremity angiogram 4. Viabahn covered stent placement to the right tibioperoneal trunk and popliteal artery with an 8 mm diameter by 25 cm length stent 5. Viabahn covered stent placement to the right SFA with 9 mm diameter by 15 cm length covered stent  6.  StarClose closure device left femoral artery  EBL: 15 cc  Contrast: 60 cc  Fluoro Time: 15.7 minutes  Moderate Conscious Sedation Time: approximately 75 minutes using 8 mg of Versed and 200 mcg of Fentanyl  Indications: Patient is a 83 y.o.male with massive popliteal aneurysms bilaterally. The patient will require an open repair on the left, but he is being brought back to see if we can treat him in endovascular fashion on the right. The patient is brought in for angiography for further evaluation and potential treatment. Risks and benefits are discussed and informed consent is obtained  Procedure: The patient was identified and appropriate procedural time out was performed. The patient was then placed supine on the table and prepped and draped in the usual sterile fashion.Moderate conscious sedation was administered during a face to face encounter with the patient throughout the procedure with my supervision of the RN administering medicines and monitoring the patient's vital signs, pulse oximetry, telemetry and mental status throughout from the start of the procedure until the patient was taken to the recovery room.  Ultrasound was used to evaluate the left common femoral artery. It was patent . A digital ultrasound image was acquired. A Seldinger needle was used to access the left common femoral artery under direct ultrasound guidance and a permanent image was performed. A 0.035 J wire was advanced without resistance and a 5Fr sheath was placed. No aortogram was required as one was recently performed I then crossed the aortic bifurcation and advanced to the right femoral head.  We advanced a Kumpe catheter down into the right SFA and popliteal arteries to better opacify the distal perfusion.  Selective right lower extremity angiogram was then performed. This demonstrated a massive popliteal aneurysm measuring over 5 cm in maximal.  The aneurysm went down into the tibioperoneal trunk.  There was occlusion of the anterior tibial artery.  The vessel normalized in the mid tibioperoneal trunk.  Both the posterior tibial arteries and peroneal arteries had occlusions and there proximal or mid segments but reconstituted distally and there were very good collateral vessels around the occlusions.  The aneurysm extended well up into the superficial femoral artery but in the mid superficial femoral artery there were actually some atherosclerotic areas that narrowed the vessel.  Due to the large size of the vessel, it was planned that we would need to use pro glides in a pre-close fashion.  Two pro glides were placed and initially we upsized to an 8 [place] sheath that was 90 cm long to park in the distal superficial femoral artery to help with purchase. The patient was systemically heparinized. I then used a Kumpe catheter and the advantage wire to navigate through the entirety of the annular SFA and popliteal arteries and down across the aneurysmal segment of the tibioperoneal trunk.  I then exchanged for a 0.018 wire and parked this in the peroneal  artery.  Attempts to cross the peroneal artery occlusion were not successful, but  given the very large collaterals present, I still felt that treatment of the aneurysmal disease would be prudent.  We then brought an 8 mm diameter by 25 cm length of Ivonne covered stent down to the mid to distal tibioperoneal trunk about a centimeter above its bifurcation to the posterior tibial and peroneal arteries.  It was important to keep both of these and there are collaterals patent for distal runoff.  The stent advanced up to about Hunter's canal and was deployed and postdilated with an 8 mm diameter by 20 cm angioplasty balloon distally.  We then upsized to a 9 French sheath and it is 0.035 wire.  A 9 mm diameter by 15 cm length of Ivonne covered stent was then deployed from the mid superficial femoral artery down about 3-4 cm into the previously placed stent in the popliteal artery.  This was postdilated with the 8 mm balloon and a good seal was obtained both proximally and distally with brisk flow through the stent and less than 10% residual stenosis in any location with narrowing.  There were some small collaterals that still fed the aneurysm sac, but these were small and did not appear consequential.  The aneurysm had been successfully excluded all the way from the mid superficial femoral tibioperoneal trunk. I elected to terminate the procedure. The sheath was removed and the Proglide closure device was deployed in the left femoral artery with excellent hemostatic result. The patient was taken to the recovery room in stable condition having tolerated the procedure well.  Findings:   Right Lower Extremity: This demonstrated a massive popliteal aneurysm measuring over 5 cm in maximal.  The aneurysm went down into the tibioperoneal trunk.  There was occlusion of the anterior tibial artery.  The vessel normalized in the mid tibioperoneal trunk.  Both the posterior tibial arteries and peroneal arteries had occlusions and there proximal or mid segments but reconstituted distally  and there were very good collateral vessels around the occlusions.  The aneurysm extended well up into the superficial femoral artery but in the mid superficial femoral artery there were actually some atherosclerotic areas that narrowed the vessel.   Disposition: Patient was taken to the recovery room in stable condition having tolerated the procedure well.  Complications: None  Leotis Pain 11/30/2017 10:09 AM   This note was created with Dragon Medical transcription system. Any errors in dictation are purely unintentional.

## 2017-11-30 NOTE — H&P (Signed)
[facility] History & Physical Update  The patient was interviewed and re-examined.  The patient's previous History and Physical has been reviewed and is unchanged.  There is no change in the plan of care. We plan to proceed with the scheduled procedure.  Leotis Pain, MD  11/30/2017, 8:13 AM

## 2017-12-01 ENCOUNTER — Encounter: Payer: Self-pay | Admitting: Vascular Surgery

## 2017-12-09 ENCOUNTER — Other Ambulatory Visit (INDEPENDENT_AMBULATORY_CARE_PROVIDER_SITE_OTHER): Payer: Self-pay | Admitting: Vascular Surgery

## 2017-12-09 DIAGNOSIS — I739 Peripheral vascular disease, unspecified: Secondary | ICD-10-CM

## 2017-12-10 ENCOUNTER — Ambulatory Visit (INDEPENDENT_AMBULATORY_CARE_PROVIDER_SITE_OTHER): Payer: Medicare HMO

## 2017-12-10 DIAGNOSIS — I739 Peripheral vascular disease, unspecified: Secondary | ICD-10-CM | POA: Diagnosis not present

## 2017-12-15 MED ORDER — CEFAZOLIN SODIUM-DEXTROSE 2-4 GM/100ML-% IV SOLN
2.0000 g | INTRAVENOUS | Status: AC
  Administered 2017-12-16: 2 g via INTRAVENOUS

## 2017-12-16 ENCOUNTER — Encounter
Admission: RE | Disposition: A | Discharge status: Home Health | Payer: Self-pay | Source: Ambulatory Visit | Attending: Vascular Surgery

## 2017-12-16 ENCOUNTER — Inpatient Hospital Stay: Payer: Medicare HMO | Admitting: Anesthesiology

## 2017-12-16 ENCOUNTER — Other Ambulatory Visit: Payer: Self-pay

## 2017-12-16 ENCOUNTER — Inpatient Hospital Stay
Admission: RE | Admit: 2017-12-16 | Discharge: 2017-12-21 | DRG: 253 | Disposition: A | Discharge status: Home Health | Payer: Medicare HMO | Source: Ambulatory Visit | Attending: Vascular Surgery | Admitting: Vascular Surgery

## 2017-12-16 DIAGNOSIS — Z95820 Peripheral vascular angioplasty status with implants and grafts: Secondary | ICD-10-CM

## 2017-12-16 DIAGNOSIS — Z8546 Personal history of malignant neoplasm of prostate: Secondary | ICD-10-CM

## 2017-12-16 DIAGNOSIS — I724 Aneurysm of artery of lower extremity: Secondary | ICD-10-CM | POA: Diagnosis present

## 2017-12-16 DIAGNOSIS — I130 Hypertensive heart and chronic kidney disease with heart failure and stage 1 through stage 4 chronic kidney disease, or unspecified chronic kidney disease: Secondary | ICD-10-CM | POA: Diagnosis present

## 2017-12-16 DIAGNOSIS — I429 Cardiomyopathy, unspecified: Secondary | ICD-10-CM | POA: Diagnosis present

## 2017-12-16 DIAGNOSIS — Z8711 Personal history of peptic ulcer disease: Secondary | ICD-10-CM

## 2017-12-16 DIAGNOSIS — I509 Heart failure, unspecified: Secondary | ICD-10-CM | POA: Diagnosis present

## 2017-12-16 DIAGNOSIS — N184 Chronic kidney disease, stage 4 (severe): Secondary | ICD-10-CM | POA: Diagnosis present

## 2017-12-16 DIAGNOSIS — K449 Diaphragmatic hernia without obstruction or gangrene: Secondary | ICD-10-CM | POA: Diagnosis present

## 2017-12-16 DIAGNOSIS — I7389 Other specified peripheral vascular diseases: Secondary | ICD-10-CM | POA: Diagnosis not present

## 2017-12-16 DIAGNOSIS — I4891 Unspecified atrial fibrillation: Secondary | ICD-10-CM | POA: Diagnosis present

## 2017-12-16 DIAGNOSIS — I723 Aneurysm of iliac artery: Secondary | ICD-10-CM | POA: Diagnosis present

## 2017-12-16 DIAGNOSIS — Z96652 Presence of left artificial knee joint: Secondary | ICD-10-CM | POA: Diagnosis present

## 2017-12-16 DIAGNOSIS — Z87891 Personal history of nicotine dependence: Secondary | ICD-10-CM

## 2017-12-16 DIAGNOSIS — I70212 Atherosclerosis of native arteries of extremities with intermittent claudication, left leg: Secondary | ICD-10-CM | POA: Diagnosis present

## 2017-12-16 DIAGNOSIS — E785 Hyperlipidemia, unspecified: Secondary | ICD-10-CM | POA: Diagnosis present

## 2017-12-16 DIAGNOSIS — I10 Essential (primary) hypertension: Secondary | ICD-10-CM | POA: Diagnosis not present

## 2017-12-16 DIAGNOSIS — Z7952 Long term (current) use of systemic steroids: Secondary | ICD-10-CM | POA: Diagnosis not present

## 2017-12-16 DIAGNOSIS — Z950 Presence of cardiac pacemaker: Secondary | ICD-10-CM

## 2017-12-16 DIAGNOSIS — N183 Chronic kidney disease, stage 3 (moderate): Secondary | ICD-10-CM | POA: Diagnosis not present

## 2017-12-16 DIAGNOSIS — E43 Unspecified severe protein-calorie malnutrition: Secondary | ICD-10-CM | POA: Diagnosis not present

## 2017-12-16 HISTORY — PX: FEMORAL-POPLITEAL BYPASS GRAFT: SHX937

## 2017-12-16 LAB — CBC
HCT: 27.7 % — ABNORMAL LOW (ref 40.0–52.0)
Hemoglobin: 9 g/dL — ABNORMAL LOW (ref 13.0–18.0)
MCH: 27.1 pg (ref 26.0–34.0)
MCHC: 32.7 g/dL (ref 32.0–36.0)
MCV: 82.8 fL (ref 80.0–100.0)
PLATELETS: 231 10*3/uL (ref 150–440)
RBC: 3.34 MIL/uL — ABNORMAL LOW (ref 4.40–5.90)
RDW: 16.7 % — AB (ref 11.5–14.5)
WBC: 10.7 10*3/uL — AB (ref 3.8–10.6)

## 2017-12-16 LAB — CREATININE, SERUM
CREATININE: 1.55 mg/dL — AB (ref 0.61–1.24)
GFR calc non Af Amer: 37 mL/min — ABNORMAL LOW (ref >60)
GFR, EST AFRICAN AMERICAN: 43 mL/min — AB (ref >60)

## 2017-12-16 LAB — TYPE AND SCREEN
ABO/RH(D): O POS
ANTIBODY SCREEN: NEGATIVE

## 2017-12-16 LAB — POCT I-STAT 4, (NA,K, GLUC, HGB,HCT)
Glucose, Bld: 109 mg/dL — ABNORMAL HIGH (ref 65–99)
HCT: 28 % — ABNORMAL LOW (ref 39.0–52.0)
Hemoglobin: 9.5 g/dL — ABNORMAL LOW (ref 13.0–17.0)
Potassium: 4.1 mmol/L (ref 3.5–5.1)
Sodium: 138 mmol/L (ref 135–145)

## 2017-12-16 LAB — GLUCOSE, CAPILLARY: Glucose-Capillary: 143 mg/dL — ABNORMAL HIGH (ref 65–99)

## 2017-12-16 SURGERY — BYPASS GRAFT FEMORAL-POPLITEAL ARTERY
Anesthesia: General | Laterality: Left | Wound class: Clean

## 2017-12-16 MED ORDER — SUGAMMADEX SODIUM 200 MG/2ML IV SOLN
INTRAVENOUS | Status: DC | PRN
  Administered 2017-12-16: 168.8 mg via INTRAVENOUS

## 2017-12-16 MED ORDER — DOCUSATE SODIUM 100 MG PO CAPS
100.0000 mg | ORAL_CAPSULE | Freq: Every day | ORAL | Status: DC
  Administered 2017-12-17 – 2017-12-21 (×5): 100 mg via ORAL
  Filled 2017-12-16 (×5): qty 1

## 2017-12-16 MED ORDER — FENTANYL CITRATE (PF) 100 MCG/2ML IJ SOLN
INTRAMUSCULAR | Status: AC
  Filled 2017-12-16: qty 2

## 2017-12-16 MED ORDER — SODIUM CHLORIDE 0.9 % IV SOLN
INTRAVENOUS | Status: DC
  Administered 2017-12-16 – 2017-12-17 (×2): via INTRAVENOUS

## 2017-12-16 MED ORDER — EVICEL 5 ML EX KIT
PACK | CUTANEOUS | Status: DC | PRN
  Administered 2017-12-16: 5 mL

## 2017-12-16 MED ORDER — PREDNISONE 20 MG PO TABS
20.0000 mg | ORAL_TABLET | Freq: Every day | ORAL | Status: DC
  Administered 2017-12-17 – 2017-12-21 (×5): 20 mg via ORAL
  Filled 2017-12-16 (×5): qty 1

## 2017-12-16 MED ORDER — FUROSEMIDE 20 MG PO TABS
20.0000 mg | ORAL_TABLET | Freq: Every day | ORAL | Status: DC
  Administered 2017-12-16 – 2017-12-21 (×6): 20 mg via ORAL
  Filled 2017-12-16 (×6): qty 1

## 2017-12-16 MED ORDER — SUGAMMADEX SODIUM 200 MG/2ML IV SOLN
INTRAVENOUS | Status: AC
  Filled 2017-12-16: qty 2

## 2017-12-16 MED ORDER — LIDOCAINE HCL (PF) 2 % IJ SOLN
INTRAMUSCULAR | Status: AC
  Filled 2017-12-16: qty 10

## 2017-12-16 MED ORDER — ALBUTEROL SULFATE (2.5 MG/3ML) 0.083% IN NEBU: 2.5000 mg | INHALATION_SOLUTION | Freq: Four times a day (QID) | RESPIRATORY_TRACT | Status: DC | PRN

## 2017-12-16 MED ORDER — DEXAMETHASONE SODIUM PHOSPHATE 10 MG/ML IJ SOLN
INTRAMUSCULAR | Status: AC
  Filled 2017-12-16: qty 1

## 2017-12-16 MED ORDER — ACETAMINOPHEN 10 MG/ML IV SOLN
INTRAVENOUS | Status: DC | PRN
  Administered 2017-12-16: 1000 mg via INTRAVENOUS

## 2017-12-16 MED ORDER — HEPARIN SODIUM (PORCINE) 1000 UNIT/ML IJ SOLN
INTRAMUSCULAR | Status: DC | PRN
  Administered 2017-12-16: 100 mL via INTRAMUSCULAR

## 2017-12-16 MED ORDER — ALUM & MAG HYDROXIDE-SIMETH 200-200-20 MG/5ML PO SUSP
15.0000 mL | ORAL | Status: DC | PRN
  Filled 2017-12-16: qty 30

## 2017-12-16 MED ORDER — MORPHINE SULFATE (PF) 2 MG/ML IV SOLN: 2.0000 mg | INTRAVENOUS | Status: DC | PRN

## 2017-12-16 MED ORDER — POTASSIUM CHLORIDE CRYS ER 20 MEQ PO TBCR: 20.0000 meq | EXTENDED_RELEASE_TABLET | Freq: Every day | ORAL | Status: DC | PRN

## 2017-12-16 MED ORDER — DEXAMETHASONE SODIUM PHOSPHATE 10 MG/ML IJ SOLN
INTRAMUSCULAR | Status: DC | PRN
  Administered 2017-12-16: 10 mg via INTRAVENOUS

## 2017-12-16 MED ORDER — DOPAMINE-DEXTROSE 3.2-5 MG/ML-% IV SOLN: 3.0000 ug/kg/min | INTRAVENOUS | Status: DC

## 2017-12-16 MED ORDER — HEPARIN SODIUM (PORCINE) 5000 UNIT/ML IJ SOLN
INTRAMUSCULAR | Status: AC
  Filled 2017-12-16: qty 1

## 2017-12-16 MED ORDER — SUCCINYLCHOLINE CHLORIDE 20 MG/ML IJ SOLN
INTRAMUSCULAR | Status: AC
Start: 2017-12-16 — End: 2017-12-16
  Filled 2017-12-16: qty 1

## 2017-12-16 MED ORDER — MAGNESIUM SULFATE 2 GM/50ML IV SOLN: 2.0000 g | Freq: Every day | INTRAVENOUS | Status: DC | PRN

## 2017-12-16 MED ORDER — FAMOTIDINE IN NACL 20-0.9 MG/50ML-% IV SOLN
20.0000 mg | Freq: Every day | INTRAVENOUS | Status: DC
  Administered 2017-12-16: 20 mg via INTRAVENOUS
  Filled 2017-12-16: qty 50

## 2017-12-16 MED ORDER — SODIUM CHLORIDE 0.9 % IV SOLN
INTRAVENOUS | Status: DC
  Administered 2017-12-16 (×3): via INTRAVENOUS

## 2017-12-16 MED ORDER — SODIUM CHLORIDE FLUSH 0.9 % IV SOLN
INTRAVENOUS | Status: AC
  Filled 2017-12-16: qty 10

## 2017-12-16 MED ORDER — FENTANYL CITRATE (PF) 100 MCG/2ML IJ SOLN
INTRAMUSCULAR | Status: AC
  Administered 2017-12-16: 50 ug via INTRAVENOUS
  Filled 2017-12-16: qty 2

## 2017-12-16 MED ORDER — ROCURONIUM BROMIDE 50 MG/5ML IV SOLN
INTRAVENOUS | Status: AC
  Filled 2017-12-16: qty 1

## 2017-12-16 MED ORDER — ONDANSETRON HCL 4 MG/2ML IJ SOLN
INTRAMUSCULAR | Status: AC
  Filled 2017-12-16: qty 2

## 2017-12-16 MED ORDER — ACETAMINOPHEN 650 MG RE SUPP: 325.0000 mg | RECTAL | Status: DC | PRN

## 2017-12-16 MED ORDER — HEPARIN SODIUM (PORCINE) 1000 UNIT/ML IJ SOLN
INTRAMUSCULAR | Status: DC | PRN
  Administered 2017-12-16: 5000 [IU] via INTRAVENOUS

## 2017-12-16 MED ORDER — OXYCODONE-ACETAMINOPHEN 5-325 MG PO TABS: 1.0000 | ORAL_TABLET | ORAL | Status: DC | PRN

## 2017-12-16 MED ORDER — DEXTROSE 5 % IV SOLN
1.5000 g | Freq: Two times a day (BID) | INTRAVENOUS | Status: AC
  Administered 2017-12-17: 1.5 g via INTRAVENOUS
  Filled 2017-12-16 (×2): qty 1.5

## 2017-12-16 MED ORDER — PROPOFOL 10 MG/ML IV BOLUS
INTRAVENOUS | Status: DC | PRN
  Administered 2017-12-16: 70 mg via INTRAVENOUS

## 2017-12-16 MED ORDER — SIMVASTATIN 20 MG PO TABS
20.0000 mg | ORAL_TABLET | Freq: Every day | ORAL | Status: DC
  Administered 2017-12-17 – 2017-12-21 (×5): 20 mg via ORAL
  Filled 2017-12-16 (×5): qty 1

## 2017-12-16 MED ORDER — DOCUSATE SODIUM 100 MG PO CAPS: 100.0000 mg | ORAL_CAPSULE | Freq: Every morning | ORAL | Status: DC

## 2017-12-16 MED ORDER — HYDRALAZINE HCL 20 MG/ML IJ SOLN: 5.0000 mg | INTRAMUSCULAR | Status: DC | PRN

## 2017-12-16 MED ORDER — ROCURONIUM BROMIDE 50 MG/5ML IV SOLN
INTRAVENOUS | Status: AC
  Filled 2017-12-16: qty 2

## 2017-12-16 MED ORDER — ONDANSETRON HCL 4 MG/2ML IJ SOLN
4.0000 mg | Freq: Once | INTRAMUSCULAR | Status: AC | PRN
  Administered 2017-12-16: 4 mg via INTRAVENOUS

## 2017-12-16 MED ORDER — ACETAMINOPHEN 325 MG PO TABS
325.0000 mg | ORAL_TABLET | ORAL | Status: DC | PRN
  Administered 2017-12-17: 650 mg via ORAL
  Filled 2017-12-16: qty 2

## 2017-12-16 MED ORDER — SODIUM CHLORIDE 0.9 % IV SOLN: 500.0000 mL | Freq: Once | INTRAVENOUS | Status: DC | PRN

## 2017-12-16 MED ORDER — TRAZODONE HCL 50 MG PO TABS: 50.0000 mg | ORAL_TABLET | Freq: Every evening | ORAL | Status: DC | PRN

## 2017-12-16 MED ORDER — SENNOSIDES-DOCUSATE SODIUM 8.6-50 MG PO TABS: 1.0000 | ORAL_TABLET | Freq: Every evening | ORAL | Status: DC | PRN

## 2017-12-16 MED ORDER — ENOXAPARIN SODIUM 30 MG/0.3ML ~~LOC~~ SOLN
30.0000 mg | SUBCUTANEOUS | Status: DC
  Administered 2017-12-17: 30 mg via SUBCUTANEOUS
  Filled 2017-12-16: qty 0.3

## 2017-12-16 MED ORDER — CARVEDILOL 6.25 MG PO TABS
3.1250 mg | ORAL_TABLET | Freq: Two times a day (BID) | ORAL | Status: DC
  Administered 2017-12-16 – 2017-12-21 (×8): 3.125 mg via ORAL
  Filled 2017-12-16 (×9): qty 1

## 2017-12-16 MED ORDER — GUAIFENESIN-DM 100-10 MG/5ML PO SYRP
15.0000 mL | ORAL_SOLUTION | ORAL | Status: DC | PRN
  Filled 2017-12-16: qty 15

## 2017-12-16 MED ORDER — CHLORHEXIDINE GLUCONATE CLOTH 2 % EX PADS: 6.0000 | MEDICATED_PAD | Freq: Once | CUTANEOUS | Status: DC

## 2017-12-16 MED ORDER — ACETAMINOPHEN 10 MG/ML IV SOLN
INTRAVENOUS | Status: AC
Start: 2017-12-16 — End: 2017-12-16
  Filled 2017-12-16: qty 100

## 2017-12-16 MED ORDER — CEFAZOLIN SODIUM-DEXTROSE 1-4 GM/50ML-% IV SOLN
INTRAVENOUS | Status: DC | PRN
  Administered 2017-12-16: 1 g via INTRAVENOUS

## 2017-12-16 MED ORDER — ROCURONIUM BROMIDE 100 MG/10ML IV SOLN
INTRAVENOUS | Status: DC | PRN
  Administered 2017-12-16: 30 mg via INTRAVENOUS
  Administered 2017-12-16: 10 mg via INTRAVENOUS
  Administered 2017-12-16: 40 mg via INTRAVENOUS
  Administered 2017-12-16 (×3): 20 mg via INTRAVENOUS
  Administered 2017-12-16: 10 mg via INTRAVENOUS

## 2017-12-16 MED ORDER — PROPOFOL 10 MG/ML IV BOLUS
INTRAVENOUS | Status: AC
  Filled 2017-12-16: qty 20

## 2017-12-16 MED ORDER — BENAZEPRIL HCL 20 MG PO TABS
40.0000 mg | ORAL_TABLET | Freq: Every day | ORAL | Status: DC
  Administered 2017-12-16 – 2017-12-21 (×6): 40 mg via ORAL
  Filled 2017-12-16: qty 2
  Filled 2017-12-16: qty 1
  Filled 2017-12-16 (×4): qty 2

## 2017-12-16 MED ORDER — CEFAZOLIN SODIUM 1 G IJ SOLR
INTRAMUSCULAR | Status: AC
  Filled 2017-12-16: qty 10

## 2017-12-16 MED ORDER — ALBUTEROL SULFATE HFA 108 (90 BASE) MCG/ACT IN AERS: 2.0000 | INHALATION_SPRAY | Freq: Four times a day (QID) | RESPIRATORY_TRACT | Status: DC | PRN

## 2017-12-16 MED ORDER — CEFAZOLIN SODIUM-DEXTROSE 2-4 GM/100ML-% IV SOLN
INTRAVENOUS | Status: AC
  Filled 2017-12-16: qty 100

## 2017-12-16 MED ORDER — TRAMADOL HCL 50 MG PO TABS: 50.0000 mg | ORAL_TABLET | Freq: Three times a day (TID) | ORAL | Status: DC | PRN

## 2017-12-16 MED ORDER — METOPROLOL TARTRATE 5 MG/5ML IV SOLN: 2.0000 mg | INTRAVENOUS | Status: DC | PRN

## 2017-12-16 MED ORDER — PANTOPRAZOLE SODIUM 40 MG PO TBEC
40.0000 mg | DELAYED_RELEASE_TABLET | Freq: Every day | ORAL | Status: DC
  Administered 2017-12-17 – 2017-12-21 (×5): 40 mg via ORAL
  Filled 2017-12-16 (×5): qty 1

## 2017-12-16 MED ORDER — EPHEDRINE SULFATE 50 MG/ML IJ SOLN
INTRAMUSCULAR | Status: DC | PRN
  Administered 2017-12-16: 10 mg via INTRAVENOUS

## 2017-12-16 MED ORDER — LABETALOL HCL 5 MG/ML IV SOLN: 10.0000 mg | INTRAVENOUS | Status: DC | PRN

## 2017-12-16 MED ORDER — UMECLIDINIUM-VILANTEROL 62.5-25 MCG/INH IN AEPB
1.0000 | INHALATION_SPRAY | Freq: Every day | RESPIRATORY_TRACT | Status: DC
Start: 2017-12-17 — End: 2017-12-21
  Administered 2017-12-18 – 2017-12-21 (×4): 1 via RESPIRATORY_TRACT
  Filled 2017-12-16 (×2): qty 14

## 2017-12-16 MED ORDER — SODIUM CHLORIDE 0.9 % IV SOLN
INTRAVENOUS | Status: DC | PRN
  Administered 2017-12-16: 25 ug/min via INTRAVENOUS

## 2017-12-16 MED ORDER — NITROGLYCERIN IN D5W 200-5 MCG/ML-% IV SOLN: 5.0000 ug/min | INTRAVENOUS | Status: DC

## 2017-12-16 MED ORDER — ONDANSETRON HCL 4 MG/2ML IJ SOLN: 4.0000 mg | Freq: Four times a day (QID) | INTRAMUSCULAR | Status: DC | PRN

## 2017-12-16 MED ORDER — FENTANYL CITRATE (PF) 100 MCG/2ML IJ SOLN
INTRAMUSCULAR | Status: DC | PRN
  Administered 2017-12-16: 50 ug via INTRAVENOUS
  Administered 2017-12-16: 25 ug via INTRAVENOUS
  Administered 2017-12-16 (×2): 50 ug via INTRAVENOUS
  Administered 2017-12-16: 25 ug via INTRAVENOUS

## 2017-12-16 MED ORDER — FENTANYL CITRATE (PF) 100 MCG/2ML IJ SOLN
25.0000 ug | INTRAMUSCULAR | Status: DC | PRN
  Administered 2017-12-16 (×2): 50 ug via INTRAVENOUS

## 2017-12-16 MED ORDER — PHENOL 1.4 % MT LIQD
1.0000 | OROMUCOSAL | Status: DC | PRN
  Filled 2017-12-16: qty 177

## 2017-12-16 MED ORDER — PHENYLEPHRINE HCL 10 MG/ML IJ SOLN
INTRAMUSCULAR | Status: DC | PRN
  Administered 2017-12-16 (×3): 100 ug via INTRAVENOUS

## 2017-12-16 MED ORDER — TRIAMTERENE-HCTZ 37.5-25 MG PO CAPS
1.0000 | ORAL_CAPSULE | Freq: Every day | ORAL | Status: DC
  Administered 2017-12-16 – 2017-12-21 (×6): 1 via ORAL
  Filled 2017-12-16 (×6): qty 1

## 2017-12-16 MED ORDER — LIDOCAINE HCL (CARDIAC) 20 MG/ML IV SOLN
INTRAVENOUS | Status: DC | PRN
  Administered 2017-12-16: 100 mg via INTRAVENOUS

## 2017-12-16 MED ORDER — EVICEL 5 ML EX KIT
PACK | CUTANEOUS | Status: AC
  Filled 2017-12-16: qty 1

## 2017-12-16 MED ORDER — SUCCINYLCHOLINE CHLORIDE 20 MG/ML IJ SOLN
INTRAMUSCULAR | Status: DC | PRN
  Administered 2017-12-16: 100 mg via INTRAVENOUS

## 2017-12-16 MED ORDER — SORBITOL 70 % SOLN
30.0000 mL | Freq: Every day | Status: DC | PRN
  Filled 2017-12-16: qty 30

## 2017-12-16 SURGICAL SUPPLY — 80 items
APPLIER CLIP 11 MED OPEN (CLIP)
APPLIER CLIP 13 LRG OPEN (CLIP)
APPLIER CLIP 9.375 SM OPEN (CLIP)
BAG COUNTER SPONGE EZ (MISCELLANEOUS) ×2 IMPLANT
BAG DECANTER FOR FLEXI CONT (MISCELLANEOUS) ×2 IMPLANT
BAG ISOLATATION DRAPE 20X20 ST (DRAPES) ×1 IMPLANT
BLADE SURG 15 STRL LF DISP TIS (BLADE) ×1 IMPLANT
BLADE SURG 15 STRL SS (BLADE) ×1
BLADE SURG SZ11 CARB STEEL (BLADE) ×2 IMPLANT
BOOT SUTURE AID YELLOW STND (SUTURE) ×4 IMPLANT
BRUSH SCRUB EZ  4% CHG (MISCELLANEOUS) ×1
BRUSH SCRUB EZ 4% CHG (MISCELLANEOUS) ×1 IMPLANT
CANISTER SUCT 1200ML W/VALVE (MISCELLANEOUS) ×2 IMPLANT
CLIP APPLIE 11 MED OPEN (CLIP) IMPLANT
CLIP APPLIE 13 LRG OPEN (CLIP) IMPLANT
CLIP APPLIE 9.375 SM OPEN (CLIP) IMPLANT
DERMABOND ADVANCED (GAUZE/BANDAGES/DRESSINGS) ×2
DERMABOND ADVANCED .7 DNX12 (GAUZE/BANDAGES/DRESSINGS) ×2 IMPLANT
DRAPE INCISE IOBAN 66X45 STRL (DRAPES) ×4 IMPLANT
DRAPE ISOLATE BAG 20X20 STRL (DRAPES) ×1
DRAPE SHEET LG 3/4 BI-LAMINATE (DRAPES) ×2 IMPLANT
DRESSING SURGICEL FIBRLLR 1X2 (HEMOSTASIS) ×2 IMPLANT
DRSG OPSITE POSTOP 3X4 (GAUZE/BANDAGES/DRESSINGS) ×2 IMPLANT
DRSG OPSITE POSTOP 4X12 (GAUZE/BANDAGES/DRESSINGS) ×2 IMPLANT
DRSG OPSITE POSTOP 4X6 (GAUZE/BANDAGES/DRESSINGS) ×2 IMPLANT
DRSG OPSITE POSTOP 4X8 (GAUZE/BANDAGES/DRESSINGS) ×2 IMPLANT
DRSG SURGICEL FIBRILLAR 1X2 (HEMOSTASIS) ×4
DRSG TELFA 3X8 NADH (GAUZE/BANDAGES/DRESSINGS) ×4 IMPLANT
DURAPREP 26ML APPLICATOR (WOUND CARE) ×4 IMPLANT
ELECT CAUTERY BLADE 6.4 (BLADE) ×2 IMPLANT
ELECT REM PT RETURN 9FT ADLT (ELECTROSURGICAL) ×2
ELECTRODE REM PT RTRN 9FT ADLT (ELECTROSURGICAL) ×1 IMPLANT
GLOVE BIO SURGEON STRL SZ7 (GLOVE) ×4 IMPLANT
GLOVE INDICATOR 7.5 STRL GRN (GLOVE) ×2 IMPLANT
GLOVE SURG SYN 8.0 (GLOVE) ×6 IMPLANT
GOWN STRL REUS W/ TWL LRG LVL3 (GOWN DISPOSABLE) ×3 IMPLANT
GOWN STRL REUS W/ TWL XL LVL3 (GOWN DISPOSABLE) ×2 IMPLANT
GOWN STRL REUS W/TWL LRG LVL3 (GOWN DISPOSABLE) ×3
GOWN STRL REUS W/TWL XL LVL3 (GOWN DISPOSABLE) ×2
HEAD CUTTING 'VALVULOTOME URSL (MISCELLANEOUS) ×1 IMPLANT
HEMOSTAT SURGICEL 2X3 (HEMOSTASIS) ×4 IMPLANT
IV NS 500ML (IV SOLUTION) ×1
IV NS 500ML BAXH (IV SOLUTION) ×1 IMPLANT
KIT RM TURNOVER STRD PROC AR (KITS) ×2 IMPLANT
LABEL OR SOLS (LABEL) ×2 IMPLANT
LOOP RED MAXI  1X406MM (MISCELLANEOUS) ×4
LOOP VESSEL MAXI 1X406 RED (MISCELLANEOUS) ×4 IMPLANT
LOOP VESSEL MINI 0.8X406 BLUE (MISCELLANEOUS) ×2 IMPLANT
LOOPS BLUE MINI 0.8X406MM (MISCELLANEOUS) ×2
NEEDLE FILTER BLUNT 18X 1/2SAF (NEEDLE) ×1
NEEDLE FILTER BLUNT 18X1 1/2 (NEEDLE) ×1 IMPLANT
NS IRRIG 1000ML POUR BTL (IV SOLUTION) ×2 IMPLANT
PACK BASIN MAJOR ARMC (MISCELLANEOUS) ×2 IMPLANT
PACK UNIVERSAL (MISCELLANEOUS) ×4 IMPLANT
PAD PREP 24X41 OB/GYN DISP (PERSONAL CARE ITEMS) ×2 IMPLANT
PENCIL ELECTRO HAND CTR (MISCELLANEOUS) IMPLANT
SPONGE LAP 18X18 5 PK (GAUZE/BANDAGES/DRESSINGS) ×6 IMPLANT
SPONGE XRAY 4X4 16PLY STRL (MISCELLANEOUS) IMPLANT
STAPLER SKIN PROX 35W (STAPLE) ×6 IMPLANT
SUT MNCRL 4-0 (SUTURE) ×1
SUT MNCRL 4-0 27XMFL (SUTURE) ×1
SUT PROLENE 5 0 RB 1 DA (SUTURE) ×4 IMPLANT
SUT PROLENE 6 0 BV (SUTURE) ×26 IMPLANT
SUT SILK 2 0 (SUTURE) ×2
SUT SILK 2 0 SH (SUTURE) ×2 IMPLANT
SUT SILK 2-0 18XBRD TIE 12 (SUTURE) ×2 IMPLANT
SUT SILK 3 0 (SUTURE) ×2
SUT SILK 3-0 18XBRD TIE 12 (SUTURE) ×2 IMPLANT
SUT SILK 4 0 (SUTURE) ×1
SUT SILK 4-0 18XBRD TIE 12 (SUTURE) ×1 IMPLANT
SUT VIC AB 2-0 CT1 27 (SUTURE) ×4
SUT VIC AB 2-0 CT1 TAPERPNT 27 (SUTURE) ×4 IMPLANT
SUT VICRYL+ 3-0 36IN CT-1 (SUTURE) ×4 IMPLANT
SUTURE MNCRL 4-0 27XMF (SUTURE) ×1 IMPLANT
SYR 20CC LL (SYRINGE) ×2 IMPLANT
SYR 3ML LL SCALE MARK (SYRINGE) ×2 IMPLANT
SYR BULB IRRIG 60ML STRL (SYRINGE) ×2 IMPLANT
TOWEL OR 17X26 4PK STRL BLUE (TOWEL DISPOSABLE) ×4 IMPLANT
TRAY FOLEY W/METER SILVER 16FR (SET/KITS/TRAYS/PACK) ×2 IMPLANT
VALVULOTOME URESIL (MISCELLANEOUS) ×2

## 2017-12-16 NOTE — Anesthesia Post-op Follow-up Note (Signed)
Anesthesia QCDR form completed.        

## 2017-12-16 NOTE — Op Note (Signed)
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, not suitable anatomy for endovascular repair, left lower extremity pain likely from a combination of poor perfusion and compression of the aneurysm, chronic kidney disease stage IV  POST-OPERATIVE DIAGNOSIS: Same as above  PROCEDURE: Left femoral to peroneal artery bypass with spliced vein graft using in situ great saphenous vein to the knee spliced with an anterior saphenous vein branch below the knee which was reversed, left peroneal artery endarterectomy  SURGEONS: Leotis Pain, MD and Hortencia Pilar, MD co-surgeons  ASSISTANT(S): None  ANESTHESIA: general  ESTIMATED BLOOD LOSS: 250 cc  FINDING(S): Massive left popliteal aneurysm.  Peroneal artery as the distal target was heavily diseased.  The saphenous vein was adequate to the level of the knee, but a spliced vein graft had to be used for the vein and an anterior saphenous vein branch was used.  SPECIMEN(S): Left peroneal artery plaque  INDICATIONS:   Devin Washington is a 84 y.o. male who presents with a massive left popliteal aneurysm with pain and swelling in the left leg which was severe and persistent.  The aneurysm is over 7 cm in maximal diameter.  It was not suitable for endovascular repair and his only runoff was a diseased peroneal artery.  The patient required surgical bypass for revascularization and treatment of the aneurysm.  Risks and benefits were discussed including but not limited to bleeding, infection, thrombosis, limb loss, injury to nearby structures, cardiopulmonary complications, and death.  DESCRIPTION: After full informed written consent was obtained, the patient was brought back to the operating room and placed supine upon the operating table.  Prior to induction, the patient was given intravenous antibiotics.  After obtaining adequate anesthesia, the patient was  prepped and draped in the standard fashion for a femoral to distal bypass operation.  We selected the groin as the inflow as the superficial femoral artery was aneurysmal pretty much throughout and was not a good vessel for proximal inflow except near its origin.  Attention was turned to the left groin.  A longitudinal incision was made over the left common femoral artery.  Using blunt dissection and electrocautery, the artery was dissected out from the inguinal ligament down to the femoral bifurcation.  The superficial femoral artery, profunda femoral artery, and external iliac artery were dissected out and vessel loops applied.  Circumflex branches were also dissected and controlled with vessel loops.  This common femoral artery was ectatic and was also found on exam to be calcified but the degree of stenosis was quite mild and this was true of the proximal SFA as well.      At this point, attention was turned to the calf.  An longitudinal incision was made one finger-width posterior to the tibia.  Using blunt dissection and electrocautery, a plane was developed through the subcutaneous tissue and fascia down to the popliteal space.  The popliteal artery aneurysm was massive dissection just below this down around the tibioperoneal trunk.  Dissection was quite tedious and the veins were quite enlarged and Ingal urged some nuisance bleeding was identified throughout.  We initially identified the posterior tibial artery, but this was found to be occluded distally and would not be a good suitable target for bypass.  We then continued our dissection more laterally until we encountered the peroneal artery.  The peroneal artery was heavily diseased but was controlled with Vesseloops proximally and distally as  the only target for distal anastomosis.    At this point, the patient's left greater saphenous vein was dissected out for harvest for conduit.  Skip incisions was made over the greater saphenous vein from the  saphenofemoral junction down to mid calf.  The vein conduit was found to be adequate for bypass conduit in the upper leg, but was quite marginal for bypass in the lower leg.  Side branches of greater saphenous vein were tied off with 3-0 silk ties. I dissected down beyond the site of planned distal anastomosis to allow it to swing over and perform an anastomosis without tension.  At the end of this process, we decided to perform the proximal bypass anastomosis and then passed the valvulotome to determine the adequacy for bypass particularly in the lower portion of the saphenous vein.  At this point, the patient was given 5000 units of Heparin intravenously.  After waiting three minutes, the external iliac artery, superficial femoral artery and profunda femoral artery were clamped.  An incision was made in the common femoral artery and extended proximally and distally with a Potts scissor.    The vein was taken off of the saphenofemoral junction after clamping the junction.  The junction was closed with two layers of 5-0 Prolene suture.  The proximal conduit was cut and bevelled to match the arteriotomy.  The arteriotomy was actually in the proximal superficial femoral artery as this was where the saphenous vein could be brought to in a tension-free fashion and there was no proximal disease within the superficial femoral artery.  The conduit was sewn to the proximal superficial femoral artery with a running stitch of 5-0 Prolene.  Prior to completing this anastomosis, all vessels were backbled.  No thrombus was noted from any vessels and backbleeding was good.  The anastomosis was completed in the usual fashion.  We then passed the valvulotome to remove all valves and allow for flow of blood through the vein graft.  With passing of the valvulotome, there developed a hematoma at and just below the level of the knee within the saphenous vein.  We extended the incision up into the lower thigh and the actual  saphenous vein was small and not suitable for bypass from the level of the knee to the upper calf.  The vein with the area of the hematoma was to reduce inflow.  There was a large anterior saphenous vein branch that was going to have to be used for bypass at this point.  This was not the actual saphenous vein that the valvulotome passed through below the knee.  We dissected out this vein from the mid calf up to the knee and then prepared this for use to be spliced to the great saphenous vein just below the level of the knee.  Taking care to keep the vein marked for orientation, we splice this anterior saphenous vein branch to the great saphenous vein at the level of the knee.  The branch was reversed so that the valves would not be an issue and the valvulotome was passed to ensure that all valves had been removed main saphenous vein.  The these 2 veins were sewn together in an end-to-end fashion with 6-0 Prolene suture.  On release of control there was brisk flow through the vein graft.  Attention was then turned to the perineal exposure.   I reset the exposure of the popliteal and tibial space.  I verified the peroneal artery was appropriately marked.  I determine the  target segment for the anastomosis.  I applied tension to control the artery proximally and distally with vessel loops.  I made an incision with a 11-blade in the artery and extended it proximally and distally with a Potts scissor.  The peroneal artery was heavily diseased, and particularly along the lateral wall not a suitable target for distal bypass.  An endarterectomy was performed to the peroneal artery by removing the thick posterior and lateral wall plaque with hemostats and then tenotomy scissors.  The distal endpoint was tacked down with a single 6-0 Prolene. I pulled the conduit to appropriate tension and length, taking into account straightening out the leg.  I adjusted the length of the conduit sharply.  I spatulated this conduit to meet  the dimensions of the arteriotomy.  The conduit was sewn to the peroneal artery with a running stitch of 6-0 Prolene.  Prior to completing this anastomosis, all vessels were backbled. No thrombus was noted from any vessels and backbleeding was good.  The bypass conduit was allowed to bleed in an antegrade fashion with excellent pulsatile flow. The anastomosis was completed in the usual fashion.  The continuous wave Doppler was then brought onto the field and strong biphasic to triphasic Doppler signals were heard in the peroneal artery distal to the bypass.  There was an excellent pulse within the graft.  The proximal peroneal artery was then ligated to avoid retrograde flow up into the aneurysm.  This was done with a 0 silk suture.  At this point, all incisions were washed out and Surgicel and fibrillar were placed into both incisions.    At this point, bleeding in both incisions were controlled with electrocautery and suture ligature.  The calf incision was closed with interrupted deep sutures to reapproximate the deep muscles and a running stitch of 3-0 Vicryl in the subcutaneous tissue.  The skin was reapproximated with staples.  Attention was turned to the groin.  The groin was repaired with a double layer of 2-0 Vicryl immediately superficial to the bypass conduit.  The superficial subcutaneous tissue was reapproximated with two layers of 3-0 Vicryl.  The skin was reapproximated with a running subcuticular of 4-0 Monocryl.  The skin was cleaned, dried, and reinforced with Dermabond.  The vein harvest incisions were closed with a layer of 3-0 Vicryl in the subcutaneous tissue.  The skin was then reapproximated with staples.  The skin was cleaned, dried, and sterile bandages applied.   The patient was then awakened from anesthesia and taken to the recovery room in stable condition having tolerated the procedure well.     COMPLICATIONS: none  CONDITION: stable   Leotis Pain 12/16/2017 2:29  PM   This note was created with Dragon Medical transcription system. Any errors in dictation are purely unintentional.

## 2017-12-16 NOTE — Anesthesia Preprocedure Evaluation (Signed)
Anesthesia Evaluation  Patient identified by MRN, date of birth, ID band Patient awake    Reviewed: Allergy & Precautions, H&P , NPO status , Patient's Chart, lab work & pertinent test results, reviewed documented beta blocker date and time   History of Anesthesia Complications Negative for: history of anesthetic complications  Airway Mallampati: II  TM Distance: >3 FB Neck ROM: full    Dental  (+) Edentulous Upper, Edentulous Lower   Pulmonary shortness of breath, neg COPD, neg recent URI, former smoker,           Cardiovascular Exercise Tolerance: Good hypertension, (-) angina+ Peripheral Vascular Disease and +CHF  (-) CAD, (-) Past MI, (-) Cardiac Stents and (-) CABG + dysrhythmias Atrial Fibrillation + pacemaker (-) Valvular Problems/Murmurs     Neuro/Psych negative neurological ROS  negative psych ROS   GI/Hepatic Neg liver ROS, hiatal hernia, PUD,   Endo/Other  negative endocrine ROS  Renal/GU Renal disease  negative genitourinary   Musculoskeletal   Abdominal   Peds  Hematology negative hematology ROS (+)   Anesthesia Other Findings Past Medical History: No date: A-fib (HCC) No date: Cardiomyopathy ([place]) No date: CHF (congestive heart failure) (HCC) No date: CKD (chronic kidney disease), stage III (HCC) No date: DJD (degenerative joint disease) 08/09/2015: Duodenal ulcer No date: Dysrhythmia No date: Erosive esophagitis No date: HH (hiatus hernia) No date: HLD (hyperlipidemia) No date: Hypertension No date: Peripheral vascular disease (HCC) No date: Presence of permanent cardiac pacemaker No date: Prostate cancer ([place])     Comment:  Prostatectomy. 08/09/2015: Rectal varices No date: Shortness of breath No date: SSS (sick sinus syndrome) (HCC)   Reproductive/Obstetrics negative OB ROS                             Anesthesia Physical Anesthesia Plan  ASA:  III  Anesthesia Plan: General   Post-op Pain Management:    Induction: Intravenous  PONV Risk Score and Plan: 2 and Ondansetron, Dexamethasone and Treatment may vary due to age or medical condition  Airway Management Planned: LMA  Additional Equipment: Arterial line  Intra-op Plan:   Post-operative Plan: Extubation in OR  Informed Consent: I have reviewed the patients History and Physical, chart, labs and discussed the procedure including the risks, benefits and alternatives for the proposed anesthesia with the patient or authorized representative who has indicated his/her understanding and acceptance.   Dental Advisory Given  Plan Discussed with: Anesthesiologist, CRNA and Surgeon  Anesthesia Plan Comments:         Anesthesia Quick Evaluation

## 2017-12-16 NOTE — Op Note (Signed)
[facility]    OPERATIVE NOTE   PROCEDURE: 1. Left femoral artery to peroneal artery bypass with composite in situ great saphenous vein with a anterior saphenous vein segment 2. Left peroneal endarterectomy  PRE-OPERATIVE DIAGNOSIS: Atherosclerotic Occlusive Disease with leg pain associated with a 7 cm popliteal artery aneurysm  POST-OPERATIVE DIAGNOSIS: Same  CO-SURGEONS: Katha Cabal, M.D. and Erskine Squibb do, MD  ASSISTANT(S): None  ANESTHESIA: general  ESTIMATED BLOOD LOSS: 250 cc  FINDING(S): None   SPECIMEN(S): Plaque from the peroneal artery to pathology  INDICATIONS:   Devin Washington is a 84 y.o. male who presents with pain in his left lower extremity associated with a 7 cm popliteal artery and as well as atherosclerotic occlusive disease.  Attempts at intervention were not successful and therefore open bypass surgery has been recommended.  The risk, benefits, and alternative for bypass operations were discussed with the patient.  The patient is aware the risks include but are not limited to: bleeding, infection, myocardial infarction, stroke, limb loss, nerve damage, need for additional procedures in the future, wound complications, and inability to complete the bypass.  The patient is voices understanding of these risks and agreed to proceed.  DESCRIPTION: After informed consent was obtained, the patient was brought back to the operating room and placed in the supine position.  Prior to induction, the patient was given intravenous antibiotics.  After general anesthesia is induced, the patient was prepped and draped in the standard fashion for a femoral to popliteal bypass operation.  Appropriate timeout is called.  With Dr. Lucky Cowboy working on the patient's right and myself working on the patient's left we began the surgery.  Attention was turned to the left groin.  A longitudinal incision was made over the left common femoral artery.  Using blunt dissection and  electrocautery, the artery was dissected circumferentially from the inguinal ligament down to the femoral bifurcation.  The left femoral artery is looped with Silastic vessel loops.    Attention is then turned to the saphenofemoral junction which is identified and then skeletonized using 3-0 and 2-0 silk sutures to ligate and divide tributaries leaving the saphenous vein proper.     Medial incision was then made in the calf and the posterior tibial artery was identified.  The dissection was then carried more medially until the fibula is identified.  This then allows for identification of the peroneal artery which is dissected circumferentially and looped with Silastic vessel loops.   The patient was given 5000 units of Heparin intravenously, and this is allowed to circulate for proximally 5 minutes.  The saphenofemoral junction is then readdressed.  A Satinsky clamp was placed below the junction and after marking the saphenous vein with a surgical marker the vein is transected at the junction with an 11 blade scalpel.  A 5-0 Prolene suture was then used to close the venotomy beginning with a horizontal mattress and then a running baseball stitch.  Release of the Satinsky clamp demonstrates hemostasis.  The Vesseloops surrounding the femoral artery are then tightened and arteriotomy is then made in the common femoral artery with an 11 blade and extended with Potts scissors.  The saphenous vein is then swung over to the arteriotomy after 6-0 Prolene stay should sutures are placed.  An end saphenous vein to side femoral artery anastomosis was then fashioned using running 5-0 Prolene.  Flushing maneuvers were performed and flow was established to the bypass graft.  The first several centimeters dilate quite  nicely ultimately identifying the first competent valve.  The distal saphenous vein was then ligated and a urocele valvulotome opened onto the field.  The valvulotome using the 2 mm head was then advanced  from the distal vein up to the femoral anastomosis.  Palpation ensured that the valvulotome did not cross the anastomosis.  2 passes were made with the 2 mm head.  Significant improvement in the pulsatile flow within the vein was identified.  Next the 3 mm cutter was applied.  At this point during withdrawal the patient developed a hematoma just at knee level.  It cutdown at this level demonstrated that there was a very atretic portion of the saphenous vein at this level and the 3 mm valvulotome had compromised the distal one third of the true saphenous vein.  Further dissection for potential conduit demonstrated an anterior branch that appeared to be 3-3-1/2 mm in diameter and this was then dissected out for a distance of approximately 20 cm.  A segment of vein was then resected and reversed and a end-to-end anastomosis fashion with 6-0 Prolene just above the atretic portion of the saphenous vein.  Thus completing a composite bypass graft using two thirds as an in situ saphenous vein in the last one third as a reversed segment.  The vein was then approximated to the peroneal target with the leg straightened and appropriate length was noted.  The vein was then spatulated.  Silastic Vesseloops were then used to control hemostasis of the peroneal proximally and distally arteriotomy was made with a limb blade Scalpel.  At this point dense plaque was encountered within the peroneal.  Endarterectomy was then performed under direct visualization beginning in the peroneal artery.  The spatulated distal vein was then applied in an end vein to side peroneal artery anastomosis using running 6-0 Prolene.  Appropriate flushing maneuvers were performed and flow was established into the peroneal.  One area in the lateral wall which was leaking was easily controlled with a single 6-0 Prolene suture.  The wounds were then irrigated with sterile saline.  The wounds were then inspected for hemostasis. Bleeding points were  controlled with electrocautery, and suture repair of active bleeding points.  Fibrillar and EvaSeal were then placed in the bed of the wounds. Once hemostasis was achieved.  The groin incision was then closed in multiple layers using both 2-0 and 3-0 Vicryl in interrupted and running fashion. Skin was closed with a subcuticular.. The medial calf wound was then closed layers using  3-0 Vicryl, and staples.   A honeycomb dressing was applied to each incision.   COMPLICATIONS: Hematoma of the saphenous vein secondary to an atretic portion of the vein  CONDITION: Good  Katha Cabal, M.D. [name] Office: [phone]  12/16/2017, 5:12 PM

## 2017-12-16 NOTE — Transfer of Care (Signed)
Immediate Anesthesia Transfer of Care Note  Patient: Devin Washington  Procedure(s) Performed: BYPASS GRAFT FEMORAL-POPLITEAL ARTERY ( OPEN POPLITEAL BYPASS ) (Left )  Patient Location: PACU  Anesthesia Type:General  Level of Consciousness: awake and sedated  Airway & Oxygen Therapy: Patient Spontanous Breathing and Patient connected to face mask oxygen  Post-op Assessment: Report given to RN and Post -op Vital signs reviewed and stable  Post vital signs: Reviewed and stable  Last Vitals:  Vitals:   12/16/17 0605  BP: (!) 171/100  Pulse: 86  Resp: 20  Temp: (!) 36.2 C  SpO2: 98%    Last Pain:  Vitals:   12/16/17 0605  TempSrc: Temporal         Complications: No apparent anesthesia complications

## 2017-12-16 NOTE — OR Nursing (Signed)
Verified operative site with Dr Lucky Cowboy,  Left is the correct side.

## 2017-12-16 NOTE — Anesthesia Procedure Notes (Signed)
Procedure Name: Intubation Date/Time: 12/16/2017 7:50 AM Performed by: Nelda Marseille, CRNA Pre-anesthesia Checklist: Patient identified, Patient being monitored, Timeout performed, Emergency Drugs available and Suction available Patient Re-evaluated:Patient Re-evaluated prior to induction Oxygen Delivery Method: Circle system utilized Preoxygenation: Pre-oxygenation with 100% oxygen Induction Type: IV induction Ventilation: Mask ventilation without difficulty Laryngoscope Size: Mac and 3 Grade View: Grade II Tube type: Oral Tube size: 7.5 mm Number of attempts: 1 Airway Equipment and Method: Stylet Placement Confirmation: ETT inserted through vocal cords under direct vision,  positive ETCO2 and breath sounds checked- equal and bilateral Secured at: 21 cm Tube secured with: Tape Dental Injury: Teeth and Oropharynx as per pre-operative assessment

## 2017-12-16 NOTE — Anesthesia Procedure Notes (Signed)
Arterial Line Insertion Start/End1/01/2018 7:56 AM, 12/16/2017 7:58 AM Performed by: Martha Clan, MD, anesthesiologist  Patient location: Pre-op. Preanesthetic checklist: patient identified, IV checked, site marked, risks and benefits discussed, surgical consent, monitors and equipment checked, pre-op evaluation, timeout performed and anesthesia consent Patient sedated Left, radial was placed Catheter size: 20 Fr Hand hygiene performed  and maximum sterile barriers used   Attempts: 1 Procedure performed without using ultrasound guided technique. Following insertion, dressing applied and Biopatch. Patient tolerated the procedure well with no immediate complications.

## 2017-12-16 NOTE — H&P (Signed)
[facility] Admission History & Physical  MRN : [id number]  Devin Washington is a 84 y.o. ([birth year]) male who presents with chief complaint of No chief complaint on file. Marland Kitchen  History of Present Illness: Patient with >7 cm left popliteal aneurysm with leg pain, swelling and difficulty walking. Anatomy not suitable for stent graft.  Had right popliteal aneurysm repaired already with stent graft. No fever or chills.  Current Facility-Administered Medications  Medication Dose Route Frequency Provider Last Rate Last Dose  . 0.9 %  sodium chloride infusion   Intravenous Continuous Penwarden, Amy, MD 10 mL/hr at 12/16/17 0625    . ceFAZolin (ANCEF) 2-4 GM/100ML-% IVPB           . ceFAZolin (ANCEF) IVPB 2g/100 mL premix  2 g Intravenous On Call to [place], PA-C      . Chlorhexidine Gluconate Cloth 2 % PADS 6 each  6 each Topical Once Stegmayer, Kimberly A, PA-C       And  . Chlorhexidine Gluconate Cloth 2 % PADS 6 each  6 each Topical Once Stegmayer, Kimberly A, PA-C      . sodium chloride flush 0.9 % injection             Past Medical History:  Diagnosis Date  . A-fib ([place])   . Cardiomyopathy ([place])   . CHF (congestive heart failure) ([place])   . CKD (chronic kidney disease), stage III ([place])   . DJD (degenerative joint disease)   . Duodenal ulcer 08/09/2015  . Dysrhythmia   . Erosive esophagitis   . HH (hiatus hernia)   . HLD (hyperlipidemia)   . Hypertension   . Peripheral vascular disease ([place])   . Presence of permanent cardiac pacemaker   . Prostate cancer [facility])    Prostatectomy.  . 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 Shortness of breath   . SSS (sick sinus syndrome) [facility])     Past Surgical History:  Procedure Laterality Date  . CATARACT EXTRACTION, BILATERAL    . ESOPHAGOGASTRODUODENOSCOPY N/A 08/09/2015   Procedure: ESOPHAGOGASTRODUODENOSCOPY (EGD) looking in the stomach with a lighted tube to evaluate and treat;  Surgeon: Lollie Sails,  MD;  Location: [facility] ENDOSCOPY;  Service: Endoscopy;  Laterality: N/A;  Procedure to be done tomorrow afternoon, 08/08/2015. Awaiting cardiology consult  . EYE SURGERY    . FLEXIBLE SIGMOIDOSCOPY N/A 08/09/2015   Procedure: FLEXIBLE SIGMOIDOSCOPY looking up the rectum into the distal colon with a lighted tube to examine and treat;  Surgeon: Lollie Sails, MD;  Location: [facility] ENDOSCOPY;  Service: Endoscopy;  Laterality: N/A;  Procedure to be done tomorrow afternoon, 08/08/2015. Awaiting cardiology consult.  . INSERT / REPLACE / REMOVE PACEMAKER    . JOINT REPLACEMENT Left    left knee replacement  . LOWER EXTREMITY ANGIOGRAPHY Left 11/16/2017   Procedure: LOWER EXTREMITY ANGIOGRAPHY;  Surgeon: Algernon Huxley, MD;  Location: [place] CV LAB;  Service: Cardiovascular;  Laterality: Left;  . LOWER EXTREMITY ANGIOGRAPHY Right 11/30/2017   Procedure: LOWER EXTREMITY ANGIOGRAPHY;  Surgeon: Algernon Huxley, MD;  Location: [place] CV LAB;  Service: Cardiovascular;  Laterality: Right;  . PACEMAKER INSERTION    . PROSTATE SURGERY      Social History Social History   Tobacco Use  . Smoking status: Former Research scientist (life sciences)  . Smokeless tobacco: Former Network engineer Use Topics  . Alcohol use: No    Alcohol/week: 0.0 oz  . Drug use: No  Family History No bleeding disorders, clotting disorders, autoimmune diseases, or aneurysms  No Known Allergies   REVIEW OF SYSTEMS (Negative unless checked)  Constitutional: [] Weight loss  [] Fever  [] Chills Cardiac: [] Chest pain   [] Chest pressure   [] Palpitations   [] Shortness of breath when laying flat   [] Shortness of breath at rest   [] Shortness of breath with exertion. Vascular:  [x] Pain in legs with walking   [x] Pain in legs at rest   [] Pain in legs when laying flat   [] Claudication   [] Pain in feet when walking  [] Pain in feet at rest  [] Pain in feet when laying flat   [] History of DVT   [] Phlebitis   [x] Swelling in legs   [] Varicose veins   [] Non-healing  ulcers Pulmonary:   [] Uses home oxygen   [] Productive cough   [] Hemoptysis   [] Wheeze  [] COPD   [] Asthma Neurologic:  [] Dizziness  [] Blackouts   [] Seizures   [] History of stroke   [] History of TIA  [] Aphasia   [] Temporary blindness   [] Dysphagia   [] Weakness or numbness in arms   [] Weakness or numbness in legs Musculoskeletal:  [x] Arthritis   [] Joint swelling   [] Joint pain   [] Low back pain Hematologic:  [] Easy bruising  [] Easy bleeding   [] Hypercoagulable state   [] Anemic  [] Hepatitis Gastrointestinal:  [] Blood in stool   [] Vomiting blood  [] Gastroesophageal reflux/heartburn   [] Difficulty swallowing. Genitourinary:  [] Chronic kidney disease   [] Difficult urination  [] Frequent urination  [] Burning with urination   [] Blood in urine Skin:  [] Rashes   [] Ulcers   [] Wounds Psychological:  [] History of anxiety   []  History of major depression.  Physical Examination  Vitals:   12/16/17 0605  BP: (!) 171/100  Pulse: 86  Resp: 20  Temp: (!) 97.1 F (36.2 C)  TempSrc: Temporal  SpO2: 98%  Weight: 84.4 kg (186 lb)   Body mass index is 27.47 kg/m. Gen: WD/WN, NAD. Appears younger than stated age. Head: [place]/AT, No temporalis wasting.  Ear/Nose/Throat: Hearing grossly intact, nares w/o erythema or drainage, oropharynx w/o Erythema/Exudate,  Eyes: Conjunctiva clear, sclera non-icteric Neck: Trachea midline.  No JVD.  Pulmonary:  Good air movement, respirations not labored, no use of accessory muscles.  Cardiac: RRR, normal S1, S2. Vascular:  Vessel Right Left  Radial Palpable Palpable  Ulnar Palpable Palpable  Brachial Palpable Palpable  Carotid Palpable, without bruit Palpable, without bruit  Aorta Not palpable N/A  Femoral Enlarged, Palpable Enlarged, Palpable  Popliteal Enlarged, Palpable Enlarged, Palpable  PT Palpable Trace Palpable  DP Not Palpable Not Palpable    Musculoskeletal: M/S 5/5 throughout.  Extremities without ischemic changes.  No deformity or atrophy. 2+ LLE  swelling Neurologic: Sensation grossly intact in extremities.  Symmetrical.  Speech is fluent. Motor exam as listed above. Psychiatric: Judgment intact, Mood & affect appropriate for pt's clinical situation. Dermatologic: No rashes or ulcers noted.  No cellulitis or open wounds.      CBC Lab Results  Component Value Date   WBC 10.5 11/27/2017   HGB 9.5 (L) 12/16/2017   HCT 28.0 (L) 12/16/2017   MCV 82.4 11/27/2017   PLT 231 11/27/2017    BMET    Component Value Date/Time   NA 138 12/16/2017 0616   K 4.1 12/16/2017 0616   CL 99 (L) 11/27/2017 0939   CO2 27 11/27/2017 0939   GLUCOSE 109 (H) 12/16/2017 0616   BUN 21 (H) 11/27/2017 0939   CREATININE 1.55 (H) 11/27/2017 0939   CALCIUM 9.6 11/27/2017  [place] (L) 11/27/2017 0939   GFRAA 43 (L) 11/27/2017 8957   Estimated Creatinine Clearance: 30.4 mL/min (A) (by C-G formula based on SCr of 1.55 mg/dL (H)).  COAG Lab Results  Component Value Date   INR 1.02 11/27/2017    Radiology No results found.    Assessment/Plan 1. Massive left popliteal aneurysm.  For open repair today as anatomy is not suitable for endovascular repair.  Risks and benefits discussed 2. Right popliteal aneurysm. Repaired with stent graft 3. HTN. Stable on outpatient medications and blood pressure control important in reducing the progression of atherosclerotic disease. On appropriate oral medications. 4. 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

## 2017-12-17 ENCOUNTER — Encounter: Payer: Self-pay | Admitting: Vascular Surgery

## 2017-12-17 LAB — CBC
HCT: 22.6 % — ABNORMAL LOW (ref 40.0–52.0)
Hemoglobin: 7.4 g/dL — ABNORMAL LOW (ref 13.0–18.0)
MCH: 26.8 pg (ref 26.0–34.0)
MCHC: 32.7 g/dL (ref 32.0–36.0)
MCV: 82.2 fL (ref 80.0–100.0)
PLATELETS: 215 10*3/uL (ref 150–440)
RBC: 2.75 MIL/uL — AB (ref 4.40–5.90)
RDW: 16.8 % — ABNORMAL HIGH (ref 11.5–14.5)
WBC: 10.3 10*3/uL (ref 3.8–10.6)

## 2017-12-17 LAB — BASIC METABOLIC PANEL
ANION GAP: 6 (ref 5–15)
BUN: 24 mg/dL — ABNORMAL HIGH (ref 6–20)
CALCIUM: 8.6 mg/dL — AB (ref 8.9–10.3)
CO2: 25 mmol/L (ref 22–32)
Chloride: 103 mmol/L (ref 101–111)
Creatinine, Ser: 1.54 mg/dL — ABNORMAL HIGH (ref 0.61–1.24)
GFR, EST AFRICAN AMERICAN: 43 mL/min — AB (ref >60)
GFR, EST NON AFRICAN AMERICAN: 37 mL/min — AB (ref >60)
GLUCOSE: 115 mg/dL — AB (ref 65–99)
POTASSIUM: 4.2 mmol/L (ref 3.5–5.1)
Sodium: 134 mmol/L — ABNORMAL LOW (ref 135–145)

## 2017-12-17 MED ORDER — FAMOTIDINE 20 MG PO TABS
20.0000 mg | ORAL_TABLET | Freq: Every day | ORAL | Status: DC
  Administered 2017-12-17 – 2017-12-20 (×4): 20 mg via ORAL
  Filled 2017-12-17 (×4): qty 1

## 2017-12-17 MED ORDER — ENOXAPARIN SODIUM 30 MG/0.3ML ~~LOC~~ SOLN
30.0000 mg | SUBCUTANEOUS | Status: DC
  Administered 2017-12-18 – 2017-12-21 (×4): 30 mg via SUBCUTANEOUS
  Filled 2017-12-17 (×4): qty 0.3

## 2017-12-17 MED ORDER — MORPHINE SULFATE (PF) 2 MG/ML IV SOLN: 2.0000 mg | INTRAVENOUS | Status: DC | PRN

## 2017-12-17 MED ORDER — TRAMADOL HCL 50 MG PO TABS: 50.0000 mg | ORAL_TABLET | Freq: Four times a day (QID) | ORAL | Status: DC | PRN

## 2017-12-17 MED ORDER — TRAMADOL HCL 50 MG PO TABS: 100.0000 mg | ORAL_TABLET | Freq: Four times a day (QID) | ORAL | Status: DC | PRN

## 2017-12-17 MED ORDER — ENOXAPARIN SODIUM 40 MG/0.4ML ~~LOC~~ SOLN: 40.0000 mg | SUBCUTANEOUS | Status: DC

## 2017-12-17 NOTE — Anesthesia Postprocedure Evaluation (Signed)
Anesthesia Post Note  Patient: Devin Washington  Procedure(s) Performed: BYPASS GRAFT FEMORAL-POPLITEAL ARTERY ( OPEN POPLITEAL BYPASS ) (Left )  Patient location during evaluation: SICU Anesthesia Type: General Level of consciousness: awake and alert Pain management: pain level controlled Vital Signs Assessment: post-procedure vital signs reviewed and stable Respiratory status: spontaneous breathing, nonlabored ventilation and respiratory function stable Cardiovascular status: stable Postop Assessment: no apparent nausea or vomiting Anesthetic complications: no     Last Vitals:  Vitals:   12/17/17 0500 12/17/17 0600  BP: (!) 128/53 (!) 144/69  Pulse: 64 72  Resp: 13 17  Temp:    SpO2: 98% 100%    Last Pain:  Vitals:   12/17/17 0400  TempSrc: Oral  PainSc: Asleep                 Brantley Fling

## 2017-12-17 NOTE — Evaluation (Signed)
Physical Therapy Evaluation Patient Details Name: Devin Washington MRN: [id number] DOB: 1923-03-27 Today's Date: 12/17/2017   History of Present Illness  Pt is a 84 y/o M with >7 cm L popliteal aneurysm with leg pain, swelling, and difficulty walking.  Pt has already had R popliteal aneurysm repaired with stent graft.  Pt is now s/p (12/16/16) L fem distal bypass with spliced vein, peroneal endarterectomy.  Pt's PMH includes a-fib, CHF, CKD, dysrhythmia, PVD, prostate cancer, SSS, presence of permanent cardiac pacemaker, L TKA.      Clinical Impression  Patient is s/p above surgery resulting in functional limitations due to the deficits listed below (see PT Problem List). Devin Washington presents from home where he lives alone and is independent at baseline ambulating with [employer], requiring assist with cooking and cleaning only.  Provided pt with min guard assist for transfers and ambulation for safety and cues for proper use of RW.  Encouraged pt to ambulate with nursing staff at least 3x/day while at the hospital.  Patient will benefit from skilled PT to increase their independence and safety with mobility to allow discharge to the venue listed below.      Follow Up Recommendations Home health PT    Equipment Recommendations  None recommended by PT    Recommendations for Other Services       Precautions / Restrictions Precautions Precautions: Fall Restrictions Weight Bearing Restrictions: No      Mobility  Bed Mobility Overal bed mobility: Independent             General bed mobility comments: No physical assist or cues needed.  Pt performs independently.   Transfers Overall transfer level: Needs assistance Equipment used: Rolling walker (2 wheeled) Transfers: Sit to/from Stand Sit to Stand: Min guard         General transfer comment: Pt rocks anteriorly for momentum to stand.  Cues provided for proper hand placement using RW.  Pt stood from bed x2.     Ambulation/Gait Ambulation/Gait assistance: Min guard Ambulation Distance (Feet): 80 Feet Assistive device: Rolling walker (2 wheeled) Gait Pattern/deviations: Step-through pattern;Decreased stride length;Decreased stance time - left;Decreased weight shift to left;Trunk flexed   Gait velocity interpretation: at or above normal speed for age/gender General Gait Details: Cues for upright posture and forward gaze as pt has tendency to push RW too far ahead.  No signs of instability, min guard for safety.   Stairs            Wheelchair Mobility    Modified Rankin (Stroke Patients Only)       Balance Overall balance assessment: Needs assistance Sitting-balance support: No upper extremity supported;Feet supported Sitting balance-Leahy Scale: Good     Standing balance support: Single extremity supported;During functional activity Standing balance-Leahy Scale: Poor Standing balance comment: Pt relies on UE support for static and dynamic activities                             Pertinent Vitals/Pain Pain Assessment: 0-10 Pain Score: 4  Pain Location: LLE Pain Descriptors / Indicators: Guarding;Grimacing;Tightness Pain Intervention(s): Monitored during session;Limited activity within patient's tolerance;Repositioned    Home Living Family/patient expects to be discharged to:: Private residence Living Arrangements: Alone Available Help at Discharge: Family;Available PRN/intermittently Type of Home: House Home Access: Stairs to enter Entrance Stairs-Rails: Left Entrance Stairs-Number of Steps: 2 Home Layout: One level Home Equipment: Walker - 2 wheels;Cane - single point;Grab bars - tub/shower;Shower seat  Prior Function Level of Independence: Independent with assistive device(s)         Comments: Pt ambulates with SPC, no falls in the past 6 months.  Has assist from friend with cooking, cleaning.  Pt still driving.       Hand Dominance         Extremity/Trunk Assessment   Upper Extremity Assessment Upper Extremity Assessment: (BUE strength grossly 4/5)    Lower Extremity Assessment Lower Extremity Assessment: LLE deficits/detail LLE Deficits / Details: Unable to formally asses due to pain.  Incision site down LLE.  Functionally strength appears at least 3/5.   LLE: Unable to fully assess due to pain    Cervical / Trunk Assessment Cervical / Trunk Assessment: Kyphotic  Communication   Communication: No difficulties  Cognition Arousal/Alertness: Awake/alert Behavior During Therapy: WFL for tasks assessed/performed Overall Cognitive Status: Within Functional Limits for tasks assessed                                        General Comments      Exercises General Exercises - Lower Extremity Ankle Circles/Pumps: AROM;Both;10 reps;Seated Long Arc Quad: AROM;Left;10 reps;Seated Other Exercises Other Exercises: Encouraged pt to ambulate at least 3x/day with nursing staff.  Other Exercises: Encouraged pt to continue performing L LAQ when legs are not elevated Other Exercises: Encouraged pt to keep LLE in E when sitting or supine to promote tissue healing in extended position   Assessment/Plan    PT Assessment Patient needs continued PT services  PT Problem List Decreased strength;Decreased range of motion;Decreased activity tolerance;Decreased balance;Decreased mobility;Pain;Decreased knowledge of use of DME;Decreased safety awareness       PT Treatment Interventions DME instruction;Gait training;Stair training;Functional mobility training;Therapeutic activities;Therapeutic exercise;Balance training;Neuromuscular re-education;Patient/family education;Modalities    PT Goals (Current goals can be found in the Care Plan section)  Acute Rehab PT Goals Patient Stated Goal: to return to PLOF PT Goal Formulation: With patient Time For Goal Achievement: 12/31/17 Potential to Achieve Goals: Good     Frequency Min 2X/week   Barriers to discharge        Co-evaluation               AM-PAC PT "6 Clicks" Daily Activity  Outcome Measure Difficulty turning over in bed (including adjusting bedclothes, sheets and blankets)?: None Difficulty moving from lying on back to sitting on the side of the bed? : None Difficulty sitting down on and standing up from a chair with arms (e.g., wheelchair, bedside commode, etc,.)?: A Little Help needed moving to and from a bed to chair (including a wheelchair)?: A Little Help needed walking in hospital room?: A Little Help needed climbing 3-5 steps with a railing? : A Little 6 Click Score: 20    End of Session Equipment Utilized During Treatment: Gait belt Activity Tolerance: Patient tolerated treatment well Patient left: in chair;with call bell/phone within reach Nurse Communication: Mobility status PT Visit Diagnosis: Pain;Unsteadiness on feet (R26.81);Other abnormalities of gait and mobility (R26.89);Muscle weakness (generalized) (M62.81) Pain - Right/Left: Left Pain - part of body: Leg    Time: 8841-6606 PT Time Calculation (min) (ACUTE ONLY): 20 min   Charges:   PT Evaluation $PT Eval Low Complexity: 1 Low PT Treatments $Gait Training: 8-22 mins   PT G Codes:        Collie Siad PT, DPT 12/17/2017, 11:35 AM

## 2017-12-17 NOTE — Progress Notes (Signed)
[facility]  Daily Progress Note   Subjective  - 1 Day Post-Op  Doing quite well.  No complaints.  Leg swelling is actually better.  Tolerating liquids  Objective Vitals:   12/17/17 0400 12/17/17 0500 12/17/17 0600 12/17/17 0700  BP: (!) 118/51 (!) 128/53 (!) 144/69 125/65  Pulse: 68 64 72 76  Resp: 14 13 17 15   Temp: 98.6 F (37 C)   99 F (37.2 C)  TempSrc: Oral     SpO2: 97% 98% 100% 97%  Weight:      Height:        Intake/Output Summary (Last 24 hours) at 12/17/2017 1000 Last data filed at 12/17/2017 0905 Gross per 24 hour  Intake 2056.67 ml  Output 2900 ml  Net -843.33 ml    PULM  CTAB CV  RRR VASC  Foot warm with good doppler signals, incisions C/D/I  Laboratory CBC    Component Value Date/Time   WBC 10.3 12/17/2017 0129   HGB 7.4 (L) 12/17/2017 0129   HCT 22.6 (L) 12/17/2017 0129   PLT 215 12/17/2017 0129    BMET    Component Value Date/Time   NA 134 (L) 12/17/2017 0129   K 4.2 12/17/2017 0129   CL 103 12/17/2017 0129   CO2 25 12/17/2017 0129   GLUCOSE 115 (H) 12/17/2017 0129   BUN 24 (H) 12/17/2017 0129   CREATININE 1.54 (H) 12/17/2017 0129   CALCIUM 8.6 (L) 12/17/2017 0129   GFRNONAA 37 (L) 12/17/2017 0129   GFRAA 43 (L) 12/17/2017 0129    Assessment/Planning: POD #1 s/p left fem distal bypass with spliced vein, peroneal endarterectomy   Transfer to floor  PT  Advance diet  Hgb 7.5.  Recheck tomorrow and start Iron    Corene Cornea Dew  12/17/2017, 10:00 AM

## 2017-12-17 NOTE — Progress Notes (Signed)
Patient alert and oriented. No complaints of pain. On room air. Tolerating diet and using urinal. Left leg honey comb dressing's intact minimal old blood drainage. Patient ambulated with PT. Being tx to floor.

## 2017-12-17 NOTE — Progress Notes (Signed)
CONCERNING: IV to Oral Route Change Policy  RECOMMENDATION: This patient is receiving famotidine by the intravenous route.  Based on criteria approved by the Pharmacy and Therapeutics Committee, the intravenous medication(s) is/are being converted to the equivalent oral dose form(s).   DESCRIPTION: These criteria include:  The patient is eating (either orally or via tube) and/or has been taking other orally administered medications for a least 24 hours  The patient has no evidence of active gastrointestinal bleeding or impaired GI absorption (gastrectomy, short bowel, patient on TNA or NPO).  If you have questions about this conversion, please contact the Pharmacy Department  []   [phone] )  [facility] [x]   [phone] )  [facility] []   [phone] )  [facility] []   [phone] )  [facility] []   [phone] )  [place], [facility] 12/17/2017 11:05 AM

## 2017-12-18 LAB — CBC
HCT: 24.3 % — ABNORMAL LOW (ref 40.0–52.0)
Hemoglobin: 8 g/dL — ABNORMAL LOW (ref 13.0–18.0)
MCH: 27.1 pg (ref 26.0–34.0)
MCHC: 33 g/dL (ref 32.0–36.0)
MCV: 82.1 fL (ref 80.0–100.0)
PLATELETS: 225 10*3/uL (ref 150–440)
RBC: 2.96 MIL/uL — AB (ref 4.40–5.90)
RDW: 16.9 % — ABNORMAL HIGH (ref 11.5–14.5)
WBC: 11.5 10*3/uL — AB (ref 3.8–10.6)

## 2017-12-18 LAB — BASIC METABOLIC PANEL
ANION GAP: 5 (ref 5–15)
BUN: 29 mg/dL — AB (ref 6–20)
CALCIUM: 9.1 mg/dL (ref 8.9–10.3)
CO2: 31 mmol/L (ref 22–32)
Chloride: 101 mmol/L (ref 101–111)
Creatinine, Ser: 1.72 mg/dL — ABNORMAL HIGH (ref 0.61–1.24)
GFR calc Af Amer: 38 mL/min — ABNORMAL LOW (ref >60)
GFR, EST NON AFRICAN AMERICAN: 33 mL/min — AB (ref >60)
Glucose, Bld: 105 mg/dL — ABNORMAL HIGH (ref 65–99)
POTASSIUM: 4.4 mmol/L (ref 3.5–5.1)
SODIUM: 137 mmol/L (ref 135–145)

## 2017-12-18 LAB — MAGNESIUM: Magnesium: 2 mg/dL (ref 1.7–2.4)

## 2017-12-18 LAB — SURGICAL PATHOLOGY

## 2017-12-18 MED ORDER — ADULT MULTIVITAMIN W/MINERALS CH
1.0000 | ORAL_TABLET | Freq: Every day | ORAL | Status: DC
  Administered 2017-12-18 – 2017-12-21 (×4): 1 via ORAL
  Filled 2017-12-18 (×7): qty 1

## 2017-12-18 NOTE — Progress Notes (Signed)
Physical Therapy Treatment Patient Details Name: Devin Washington MRN: [id number] DOB: [birth year] Today's Date: 12/18/2017    History of Present Illness Pt is a 84 y/o M with >7 cm L popliteal aneurysm with leg pain, swelling, and difficulty walking.  Pt has already had R popliteal aneurysm repaired with stent graft.  Pt is now s/p (12/16/16) L fem distal bypass with spliced vein, peroneal endarterectomy.  Pt's PMH includes a-fib, CHF, CKD, dysrhythmia, PVD, prostate cancer, SSS, presence of permanent cardiac pacemaker, L TKA.      PT Comments    Devin Washington made good progress with mobility.  Pt tolerated new therapeutic exercises and encouraged pt to ambulate at least 3x/day with nursing staff. Notified nursing staff as well.  Pt ambulated 120 ft with RW with this PT with cues for upright posture and forward gaze.  Pt will benefit from continued skilled PT services in the home setting to increase functional independence and safety.   Follow Up Recommendations  Home health PT     Equipment Recommendations  None recommended by PT    Recommendations for Other Services       Precautions / Restrictions Precautions Precautions: Fall Restrictions Weight Bearing Restrictions: No    Mobility  Bed Mobility Overal bed mobility: Independent             General bed mobility comments: No physical assist or cues needed.  Pt performs independently.   Transfers Overall transfer level: Needs assistance Equipment used: Rolling walker (2 wheeled) Transfers: Sit to/from Stand Sit to Stand: Supervision         General transfer comment: Cues for proper hand placement and safe technique to stand on first sit>stand.  No cues needed on second sit>stand.   Ambulation/Gait Ambulation/Gait assistance: Min guard Ambulation Distance (Feet): 120 Feet Assistive device: Rolling walker (2 wheeled) Gait Pattern/deviations: Step-through pattern;Decreased stride length;Decreased stance time -  left;Decreased weight shift to left;Trunk flexed Gait velocity: decreased Gait velocity interpretation: Below normal speed for age/gender General Gait Details: Cues for upright posture and forward gaze as pt has tendency to push RW too far ahead.  Utilized Pharmacist, hospital as postural cue.   No signs of instability, min guard for safety.    Stairs            Wheelchair Mobility    Modified Rankin (Stroke Patients Only)       Balance Overall balance assessment: Needs assistance Sitting-balance support: No upper extremity supported;Feet supported Sitting balance-Leahy Scale: Good     Standing balance support: Single extremity supported;During functional activity Standing balance-Leahy Scale: Poor Standing balance comment: Pt relies on UE support for static and dynamic activities                            Cognition Arousal/Alertness: Awake/alert Behavior During Therapy: WFL for tasks assessed/performed Overall Cognitive Status: Within Functional Limits for tasks assessed                                        Exercises General Exercises - Lower Extremity Ankle Circles/Pumps: AROM;Both;10 reps;Seated Long Arc Quad: AROM;Left;10 reps;Seated Heel Slides: AROM;Left;10 reps;Supine Hip Flexion/Marching: Both;10 reps;Seated Other Exercises Other Exercises: Encouraged pt to ambulate at least 3x/day with nursing staff. Notified nursing staff as well.     General Comments        Pertinent Vitals/Pain Pain  Assessment: 0-10 Pain Score: 2  Pain Location: LLE Pain Descriptors / Indicators: Tightness;Discomfort Pain Intervention(s): Limited activity within patient's tolerance;Monitored during session;Repositioned    Home Living                      Prior Function            PT Goals (current goals can now be found in the care plan section) Acute Rehab PT Goals Patient Stated Goal: to return to PLOF PT Goal Formulation: With  patient Time For Goal Achievement: 12/31/17 Potential to Achieve Goals: Good Progress towards PT goals: Progressing toward goals    Frequency    Min 2X/week      PT Plan Current plan remains appropriate    Co-evaluation              AM-PAC PT "6 Clicks" Daily Activity  Outcome Measure  Difficulty turning over in bed (including adjusting bedclothes, sheets and blankets)?: None Difficulty moving from lying on back to sitting on the side of the bed? : None Difficulty sitting down on and standing up from a chair with arms (e.g., wheelchair, bedside commode, etc,.)?: A Little Help needed moving to and from a bed to chair (including a wheelchair)?: A Little Help needed walking in hospital room?: A Little Help needed climbing 3-5 steps with a railing? : A Little 6 Click Score: 20    End of Session Equipment Utilized During Treatment: Gait belt Activity Tolerance: Patient tolerated treatment well Patient left: in chair;with call bell/phone within reach;with chair alarm set Nurse Communication: Mobility status PT Visit Diagnosis: Pain;Unsteadiness on feet (R26.81);Other abnormalities of gait and mobility (R26.89);Muscle weakness (generalized) (M62.81) Pain - Right/Left: Left Pain - part of body: Leg     Time: 5956-3875 PT Time Calculation (min) (ACUTE ONLY): 19 min  Charges:  $Gait Training: 8-22 mins                    G Codes:       Collie Siad PT, DPT 12/18/2017, 2:18 PM

## 2017-12-18 NOTE — Care Management Important Message (Signed)
Important Message  Patient Details  Name: Devin Washington MRN: [id number] Date of Birth: 1923/04/18   Medicare Important Message Given:  Yes    Beverly Sessions, RN 12/18/2017, 3:22 PM

## 2017-12-18 NOTE — Care Management (Signed)
Patient status post Left femoral to peroneal artery bypass.  Patient lives at home alone.  Grandchildren live locally for support.  PCP [facility]using.  Ph[facility]acy [facility].  Patient still independent and drives at baseline.  PT has assessed patient and recommends home health PT.  Patient agreeable to services and does not have a preference of home health agency. Patient has RW and cane in the home.  Referral made to [facility] with [facility].  MD to add RN to home health order.

## 2017-12-18 NOTE — Progress Notes (Signed)
[facility]  Daily Progress Note   Subjective  - 2 Days Post-Op  Doing well.  No complaints.  Did well with PT  Objective Vitals:   12/17/17 1100 12/17/17 1306 12/17/17 1955 12/18/17 0440  BP: (!) 167/84 (!) 143/69 (!) 141/61 (!) 134/58  Pulse:  97 (!) 107 75  Resp: (!) 22 18 20 20   Temp:  98 F (36.7 C) 98.3 F (36.8 C) 98.5 F (36.9 C)  TempSrc:  Oral Oral Oral  SpO2:  94% 100% 100%  Weight:      Height:        Intake/Output Summary (Last 24 hours) at 12/18/2017 0838 Last data filed at 12/18/2017 0835 Gross per 24 hour  Intake 360 ml  Output 2675 ml  Net -2315 ml    PULM  CTAB CV  RRR VASC  Stable LLE swelling, foot warm, good doppler signals  Laboratory CBC    Component Value Date/Time   WBC 11.5 (H) 12/18/2017 0409   HGB 8.0 (L) 12/18/2017 0409   HCT 24.3 (L) 12/18/2017 0409   PLT 225 12/18/2017 0409    BMET    Component Value Date/Time   NA 137 12/18/2017 0409   K 4.4 12/18/2017 0409   CL 101 12/18/2017 0409   CO2 31 12/18/2017 0409   GLUCOSE 105 (H) 12/18/2017 0409   BUN 29 (H) 12/18/2017 0409   CREATININE 1.72 (H) 12/18/2017 0409   CALCIUM 9.1 12/18/2017 0409   GFRNONAA 33 (L) 12/18/2017 0409   GFRAA 38 (L) 12/18/2017 0409    Assessment/Planning: POD #2 s/p left fem tibial bypass with spliced vein and peroneal endarterectomy   Doing well  Hgb stable  PT  Possible discharge tomorrow    Leotis Pain  12/18/2017, 8:38 AM

## 2017-12-19 DIAGNOSIS — I724 Aneurysm of artery of lower extremity: Principal | ICD-10-CM

## 2017-12-19 LAB — BASIC METABOLIC PANEL
Anion gap: 10 (ref 5–15)
BUN: 37 mg/dL — AB (ref 6–20)
CALCIUM: 8.9 mg/dL (ref 8.9–10.3)
CO2: 27 mmol/L (ref 22–32)
CREATININE: 2.21 mg/dL — AB (ref 0.61–1.24)
Chloride: 100 mmol/L — ABNORMAL LOW (ref 101–111)
GFR calc Af Amer: 28 mL/min — ABNORMAL LOW (ref >60)
GFR, EST NON AFRICAN AMERICAN: 24 mL/min — AB (ref >60)
Glucose, Bld: 101 mg/dL — ABNORMAL HIGH (ref 65–99)
Potassium: 3.9 mmol/L (ref 3.5–5.1)
SODIUM: 137 mmol/L (ref 135–145)

## 2017-12-19 LAB — CBC
HCT: 25.1 % — ABNORMAL LOW (ref 40.0–52.0)
HEMOGLOBIN: 8.2 g/dL — AB (ref 13.0–18.0)
MCH: 26.9 pg (ref 26.0–34.0)
MCHC: 32.7 g/dL (ref 32.0–36.0)
MCV: 82.3 fL (ref 80.0–100.0)
PLATELETS: 219 10*3/uL (ref 150–440)
RBC: 3.05 MIL/uL — ABNORMAL LOW (ref 4.40–5.90)
RDW: 16.9 % — AB (ref 11.5–14.5)
WBC: 11.3 10*3/uL — ABNORMAL HIGH (ref 3.8–10.6)

## 2017-12-19 LAB — MAGNESIUM: MAGNESIUM: 2.1 mg/dL (ref 1.7–2.4)

## 2017-12-19 MED ORDER — SODIUM CHLORIDE 0.9 % IV SOLN
INTRAVENOUS | Status: DC
  Administered 2017-12-19 – 2017-12-21 (×4): via INTRAVENOUS

## 2017-12-19 NOTE — Progress Notes (Signed)
Physical Therapy Treatment Patient Details Name: Devin Washington MRN: [id number] DOB: [birth year] Today's Date: 12/19/2017    History of Present Illness Pt is a 84 y/o M with >7 cm L popliteal aneurysm with leg pain, swelling, and difficulty walking.  Pt has already had R popliteal aneurysm repaired with stent graft.  Pt is now s/p (12/16/16) L fem distal bypass with spliced vein, peroneal endarterectomy.  Pt's PMH includes a-fib, CHF, CKD, dysrhythmia, PVD, prostate cancer, SSS, presence of permanent cardiac pacemaker, L TKA.      PT Comments    Pt stood and was able to ambulate around nursing unit.  Some fatigue noted but he was able to complete lap with only a short standing rest break.  No LOB or bucking but did require minimal verbal cues to increase safety.   Follow Up Recommendations  Home health PT     Equipment Recommendations  None recommended by PT    Recommendations for Other Services       Precautions / Restrictions Precautions Precautions: Fall Restrictions Weight Bearing Restrictions: No    Mobility  Bed Mobility               General bed mobility comments: up in recliner  Transfers Overall transfer level: Needs assistance Equipment used: Rolling walker (2 wheeled) Transfers: Sit to/from Stand Sit to Stand: Supervision         General transfer comment: Cues for proper hand placement and safe technique to stand on first sit>stand.  No cues needed on second sit>stand.   Ambulation/Gait Ambulation/Gait assistance: Min guard Ambulation Distance (Feet): 200 Feet Assistive device: Rolling walker (2 wheeled) Gait Pattern/deviations: Step-through pattern   Gait velocity interpretation: Below normal speed for age/gender     Stairs            Wheelchair Mobility    Modified Rankin (Stroke Patients Only)       Balance Overall balance assessment: Needs assistance Sitting-balance support: No upper extremity supported;Feet supported Sitting  balance-Leahy Scale: Good     Standing balance support: Single extremity supported;During functional activity Standing balance-Leahy Scale: Poor Standing balance comment: Pt relies on UE support for static and dynamic activities                            Cognition Arousal/Alertness: Awake/alert Behavior During Therapy: WFL for tasks assessed/performed Overall Cognitive Status: Within Functional Limits for tasks assessed                                        Exercises      General Comments        Pertinent Vitals/Pain Pain Assessment: 0-10 Pain Score: 2  Pain Location: LLE Pain Descriptors / Indicators: Tightness;Discomfort Pain Intervention(s): Limited activity within patient's tolerance    Home Living                      Prior Function            PT Goals (current goals can now be found in the care plan section) Progress towards PT goals: Progressing toward goals    Frequency    Min 2X/week      PT Plan Current plan remains appropriate    Co-evaluation              AM-PAC PT "6 Clicks" Daily  Activity  Outcome Measure  Difficulty turning over in bed (including adjusting bedclothes, sheets and blankets)?: None Difficulty moving from lying on back to sitting on the side of the bed? : None Difficulty sitting down on and standing up from a chair with arms (e.g., wheelchair, bedside commode, etc,.)?: A Little Help needed moving to and from a bed to chair (including a wheelchair)?: A Little Help needed walking in hospital room?: A Little Help needed climbing 3-5 steps with a railing? : A Little 6 Click Score: 20    End of Session Equipment Utilized During Treatment: Gait belt Activity Tolerance: Patient tolerated treatment well Patient left: in chair;with call bell/phone within reach;with chair alarm set   Pain - Right/Left: Left Pain - part of body: Leg     Time: 1207-1215 PT Time Calculation (min) (ACUTE  ONLY): 8 min  Charges:  $Gait Training: 8-22 mins                    G Codes:       Chesley Noon, PTA 12/19/17, 12:38 PM

## 2017-12-19 NOTE — Progress Notes (Signed)
[facility]  Daily Progress Note   Subjective: 3 Days Post-Op:   Patient without complaint this AM.  Sitting comfortably eating lunch.  Patient continues to work daily with physical therapy.  Patient denies any discomfort to the left lower extremity with the exception of some incisional pain.  Objective: Vitals:   12/19/17 1013 12/19/17 1244 12/19/17 1246 12/19/17 1250  BP: (!) 111/47 (!) 88/47 (!) 87/44 (!) 100/54  Pulse: 97 94 93   Resp:  20    Temp:  98.2 F (36.8 C)    TempSrc:  Oral    SpO2:  100% 100%   Weight:      Height:        Intake/Output Summary (Last 24 hours) at 12/19/2017 1408 Last data filed at 12/19/2017 1018 Gross per 24 hour  Intake 840 ml  Output 1200 ml  Net -360 ml   Physical Exam: A&Ox3, NAD CV: RRR Pulmonary: CTA Bilaterally Abdomen: Soft, Nontender, Nondistended Vascular:  Left lower extremity: Staples intact clean and dry.  Left groin incision dermabond intact clean and dry. Thigh soft.  Calf soft.  Faint pedal pulses however the left foot is warm.   Laboratory: CBC    Component Value Date/Time   WBC 11.3 (H) 12/19/2017 0454   HGB 8.2 (L) 12/19/2017 0454   HCT 25.1 (L) 12/19/2017 0454   PLT 219 12/19/2017 0454   BMET    Component Value Date/Time   NA 137 12/19/2017 0454   K 3.9 12/19/2017 0454   CL 100 (L) 12/19/2017 0454   CO2 27 12/19/2017 0454   GLUCOSE 101 (H) 12/19/2017 0454   BUN 37 (H) 12/19/2017 0454   CREATININE 2.21 (H) 12/19/2017 0454   CALCIUM 8.9 12/19/2017 0454   GFRNONAA 24 (L) 12/19/2017 0454   GFRAA 28 (L) 12/19/2017 0454   Assessment/Planning: 84 year old male status post left femoral to peroneal artery bypass and left peroneal artery endarterectomy postop day POD #3 - Stable  1) Patient continues to do well working with physical therapy on a daily basis.  Physical therapy is recommending discharge home with home physical therapy services.  Case management has been consulted and physical  therapy / visiting nurse services are being put in place.  Possible discharge home tomorrow. 2) patient kidney function slowly trending upward.  Creatinine today: 2.21.  We will start gentle hydration.  Labs ordered for a.m. If kidney function continues to trend upward we will consult nephrology.  Marcelle Overlie PA-C 12/19/2017 2:08 PM

## 2017-12-20 LAB — BASIC METABOLIC PANEL WITH GFR
Anion gap: 6 (ref 5–15)
BUN: 47 mg/dL — ABNORMAL HIGH (ref 6–20)
CO2: 29 mmol/L (ref 22–32)
Calcium: 8.8 mg/dL — ABNORMAL LOW (ref 8.9–10.3)
Chloride: 105 mmol/L (ref 101–111)
Creatinine, Ser: 2.07 mg/dL — ABNORMAL HIGH (ref 0.61–1.24)
GFR calc Af Amer: 30 mL/min — ABNORMAL LOW
GFR calc non Af Amer: 26 mL/min — ABNORMAL LOW
Glucose, Bld: 106 mg/dL — ABNORMAL HIGH (ref 65–99)
Potassium: 4.2 mmol/L (ref 3.5–5.1)
Sodium: 140 mmol/L (ref 135–145)

## 2017-12-20 LAB — CBC
HCT: 24.9 % — ABNORMAL LOW (ref 40.0–52.0)
Hemoglobin: 8 g/dL — ABNORMAL LOW (ref 13.0–18.0)
MCH: 26.9 pg (ref 26.0–34.0)
MCHC: 32.3 g/dL (ref 32.0–36.0)
MCV: 83.3 fL (ref 80.0–100.0)
Platelets: 229 K/uL (ref 150–440)
RBC: 2.98 MIL/uL — ABNORMAL LOW (ref 4.40–5.90)
RDW: 17 % — ABNORMAL HIGH (ref 11.5–14.5)
WBC: 11.3 K/uL — ABNORMAL HIGH (ref 3.8–10.6)

## 2017-12-20 LAB — MAGNESIUM: Magnesium: 2.3 mg/dL (ref 1.7–2.4)

## 2017-12-20 MED ORDER — CLOPIDOGREL BISULFATE 75 MG PO TABS
75.0000 mg | ORAL_TABLET | Freq: Every day | ORAL | Status: DC
  Administered 2017-12-20 – 2017-12-21 (×2): 75 mg via ORAL
  Filled 2017-12-20 (×2): qty 1

## 2017-12-20 MED ORDER — ASPIRIN EC 81 MG PO TBEC
81.0000 mg | DELAYED_RELEASE_TABLET | Freq: Every day | ORAL | Status: DC
  Administered 2017-12-20 – 2017-12-21 (×2): 81 mg via ORAL
  Filled 2017-12-20 (×2): qty 1

## 2017-12-20 NOTE — Progress Notes (Signed)
[facility]  Daily Progress Note   Subjective: 4 Days Post-Op: Left femoral to peroneal artery bypass with spliced vein graft using in situ great saphenous vein to the knee spliced with an anterior saphenous vein branch below the knee which was reversed, left peroneal artery endarterectomy  Patient is without complaint comfortably sitting in a chair watching television.  Denies any left lower extremity pain with the exception of some incisional discomfort with ambulation.  Patient continues to work with physical therapy.  Objective: Vitals:   12/19/17 2114 12/20/17 0529 12/20/17 1014 12/20/17 1228  BP: (!) 102/53 134/70 (!) 128/50 (!) 140/57  Pulse: 88 82 86   Resp: 18 18  20   Temp: 98.6 F (37 C) 98.4 F (36.9 C)  98.6 F (37 C)  TempSrc: Oral Oral  Oral  SpO2: 100% 100%    Weight:      Height:        Intake/Output Summary (Last 24 hours) at 12/20/2017 1445 Last data filed at 12/20/2017 1415 Gross per 24 hour  Intake 1600 ml  Output 1225 ml  Net 375 ml   Physical Exam: A&Ox3, NAD CV: RRR Pulmonary: CTA Bilaterally Abdomen: Soft, Nontender, Nondistended Vascular:  Left groin incision: Dermabond intact.  No signs of drainage.  No signs of swelling.  No signs of infection.  Left lower extremity: Staples intact.  No drainage.  No swelling.  Thigh is soft.  Calf is soft.  Hard to palpate pedal pulses however the left foot is warm.   Laboratory: CBC    Component Value Date/Time   WBC 11.3 (H) 12/20/2017 0447   HGB 8.0 (L) 12/20/2017 0447   HCT 24.9 (L) 12/20/2017 0447   PLT 229 12/20/2017 0447   BMET    Component Value Date/Time   NA 140 12/20/2017 0447   K 4.2 12/20/2017 0447   CL 105 12/20/2017 0447   CO2 29 12/20/2017 0447   GLUCOSE 106 (H) 12/20/2017 0447   BUN 47 (H) 12/20/2017 0447   CREATININE 2.07 (H) 12/20/2017 0447   CALCIUM 8.8 (L) 12/20/2017 0447   GFRNONAA 26 (L) 12/20/2017 0447   GFRAA 30 (L) 12/20/2017 0447    Assessment/Planning: The patient is a 84 year old male status post left femoral to peroneal artery bypass and left peroneal artery endarterectomy postop day POD #4 - Stable 1) Patient with a slight decrease in his creatinine from 2.21 yesterday to 2.07 today.  I will continue hydration with normal saline.  His discharge will be placed on hold due to his increased creatinine.  I had a long discussion with the patient about this and he understands why and is in agreement.  A.M. labs are ordered.  Possible discharge home Monday or Tuesday based on the patient's creatinine. 2) Patient will need to be discharged home with home physical therapy and nursing services for wound care - case management has been consulted. 3) Started patient on Aspirin and Plavix today.  Hemoglobin has been stable since surgery.   Discussed with Dr. Eber Hong Jaretssi Kraker PA-C 12/20/2017 2:45 PM

## 2017-12-21 LAB — MAGNESIUM: MAGNESIUM: 2 mg/dL (ref 1.7–2.4)

## 2017-12-21 LAB — BASIC METABOLIC PANEL
Anion gap: 6 (ref 5–15)
BUN: 42 mg/dL — ABNORMAL HIGH (ref 6–20)
CALCIUM: 8.7 mg/dL — AB (ref 8.9–10.3)
CHLORIDE: 107 mmol/L (ref 101–111)
CO2: 24 mmol/L (ref 22–32)
CREATININE: 1.68 mg/dL — AB (ref 0.61–1.24)
GFR, EST AFRICAN AMERICAN: 39 mL/min — AB (ref >60)
GFR, EST NON AFRICAN AMERICAN: 34 mL/min — AB (ref >60)
Glucose, Bld: 91 mg/dL (ref 65–99)
Potassium: 4.3 mmol/L (ref 3.5–5.1)
SODIUM: 137 mmol/L (ref 135–145)

## 2017-12-21 LAB — CBC
HCT: 22.6 % — ABNORMAL LOW (ref 40.0–52.0)
Hemoglobin: 7.5 g/dL — ABNORMAL LOW (ref 13.0–18.0)
MCH: 27.9 pg (ref 26.0–34.0)
MCHC: 33.4 g/dL (ref 32.0–36.0)
MCV: 83.7 fL (ref 80.0–100.0)
PLATELETS: 219 10*3/uL (ref 150–440)
RBC: 2.7 MIL/uL — ABNORMAL LOW (ref 4.40–5.90)
RDW: 17.7 % — ABNORMAL HIGH (ref 11.5–14.5)
WBC: 11.2 10*3/uL — ABNORMAL HIGH (ref 3.8–10.6)

## 2017-12-21 MED ORDER — CLOPIDOGREL BISULFATE 75 MG PO TABS: 75.0000 mg | ORAL_TABLET | Freq: Every day | ORAL | 3 refills | Status: DC

## 2017-12-21 MED ORDER — TRAMADOL HCL 50 MG PO TABS: 50.0000 mg | ORAL_TABLET | Freq: Four times a day (QID) | ORAL | 1 refills | Status: DC | PRN

## 2017-12-21 MED ORDER — ASPIRIN 81 MG PO TBEC: 81.0000 mg | DELAYED_RELEASE_TABLET | Freq: Every day | ORAL | 3 refills | Status: DC

## 2017-12-21 NOTE — Care Management Note (Signed)
Case Management Note  Patient Details  Name: KALVIN BUSS MRN: [id number] Date of Birth: [birth year]   Patient to discharge home today.  RNCM confirmed with MD that patient will only require PT services at discharge.  Malachy Mood with [facility] notified of discharge.  RNCM signing off.   Subjective/Objective:                    Action/Plan:   Expected Discharge Date:  12/21/17               Expected Discharge Plan:  [place]  In-House Referral:     Discharge planning Services  CM Consult  Post Acute Care Choice:  Home Health Choice offered to:  Patient  DME Arranged:    DME Agency:     HH Arranged:  PT HH Agency:  [place]  Status of Service:  Completed, signed off  If discussed at [place] of Stay Meetings, dates discussed:    Additional Comments:  Beverly Sessions, RN 12/21/2017, 3:42 PM

## 2017-12-21 NOTE — Discharge Summary (Signed)
[facility]    Discharge Summary    Patient ID:  Devin Washington MRN: [id number] DOB/AGE: [birth year] 84 y.o.  Admit date: 12/16/2017 Discharge date: 12/21/2017 Date of Surgery: 12/16/2017 Surgeon: Surgeon(s): Jakavion Bilodeau, Erskine Squibb, MD Schnier, Dolores Lory, MD  Admission Diagnosis: ASO WITH CLAUDICATION  Discharge Diagnoses:  ASO WITH CLAUDICATION  Secondary Diagnoses: Past Medical History:  Diagnosis Date  . A-fib ([place])   . Cardiomyopathy ([place])   . CHF (congestive heart failure) ([place])   . CKD (chronic kidney disease), stage III ([place])   . DJD (degenerative joint disease)   . Duodenal ulcer 08/09/2015  . Dysrhythmia   . Erosive esophagitis   . HH (hiatus hernia)   . HLD (hyperlipidemia)   . Hypertension   . Peripheral vascular disease ([place])   . Presence of permanent cardiac pacemaker   . Prostate cancer [facility])    Prostatectomy.  . 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 Shortness of breath   . SSS (sick sinus syndrome) (HCC)     Procedure(s): BYPASS GRAFT FEMORAL-POPLITEAL ARTERY ( OPEN POPLITEAL BYPASS )  Discharged Condition: good  HPI:  Patient with massive popliteal aneurysms, here for surgical repair of left popliteal aneurysm  Hospital Course:  Devin Washington is a 84 y.o. male is S/P Left Procedure(s): BYPASS GRAFT FEMORAL-POPLITEAL ARTERY ( OPEN POPLITEAL BYPASS ) Extubated: POD # 0 Physical exam: left leg swelling stable, foot Post-op wounds clean, dry, intact or healing well Pt. Ambulating, voiding and taking PO diet without difficulty. Pt pain controlled with PO pain meds. Labs as below Complications:none  Consults:    Significant Diagnostic Studies: CBC Lab Results  Component Value Date   WBC 11.2 (H) 12/21/2017   HGB 7.5 (L) 12/21/2017   HCT 22.6 (L) 12/21/2017   MCV 83.7 12/21/2017   PLT 219 12/21/2017    BMET    Component Value Date/Time   NA 137 12/21/2017 0550   K 4.3 12/21/2017 0550   CL 107 12/21/2017 0550   CO2 24  12/21/2017 0550   GLUCOSE 91 12/21/2017 0550   BUN 42 (H) 12/21/2017 0550   CREATININE 1.68 (H) 12/21/2017 0550   CALCIUM 8.7 (L) 12/21/2017 0550   GFRNONAA 34 (L) 12/21/2017 0550   GFRAA 39 (L) 12/21/2017 0550   COAG Lab Results  Component Value Date   INR 1.02 11/27/2017     Disposition:  Discharge to :Home Discharge Instructions    Call MD for:  redness, tenderness, or signs of infection (pain, swelling, bleeding, redness, odor or green/yellow discharge around incision site)   Complete by:  As directed    Call MD for:  severe or increased pain, loss or decreased feeling  in affected limb(s)   Complete by:  As directed    Call MD for:  temperature >100.5   Complete by:  As directed    Driving Restrictions   Complete by:  As directed    No driving for one week   No dressing needed   Complete by:  As directed    Replace only if drainage present   Resume previous diet   Complete by:  As directed      Allergies as of 12/21/2017   No Known Allergies     Medication List    TAKE these medications   albuterol 108 (90 Base) MCG/ACT inhaler Commonly known as:  PROVENTIL HFA;VENTOLIN HFA Inhale 2 puffs into the lungs every 6 (six) hours as needed for wheezing or shortness of breath.  ANORO ELLIPTA 62.5-25 MCG/INH Aepb Generic drug:  umeclidinium-vilanterol Inhale 1 puff into the lungs 2 (two) times daily.   aspirin 81 MG EC tablet Take 1 tablet (81 mg total) by mouth daily. Start taking on:  12/22/2017   benazepril 40 MG tablet Commonly known as:  LOTENSIN Take 40 mg by mouth daily.   carvedilol 3.125 MG tablet Commonly known as:  COREG Take 1 tablet by mouth 2 (two) times daily.   clopidogrel 75 MG tablet Commonly known as:  PLAVIX Take 1 tablet (75 mg total) by mouth daily. Start taking on:  12/22/2017   docusate sodium 100 MG capsule Commonly known as:  COLACE Take 100 mg by mouth every morning.   furosemide 20 MG tablet Commonly known as:  LASIX Take 1  tablet by mouth daily.   pantoprazole 40 MG tablet Commonly known as:  PROTONIX Take 40 mg by mouth daily.   predniSONE 20 MG tablet Commonly known as:  DELTASONE Take 20 mg by mouth daily with breakfast.   simvastatin 20 MG tablet Commonly known as:  ZOCOR Take 20 mg by mouth daily.   traMADol 50 MG tablet Commonly known as:  ULTRAM Take 1 tablet (50 mg total) by mouth every 6 (six) hours as needed for moderate pain or severe pain. What changed:  when to take this   traZODone 50 MG tablet Commonly known as:  DESYREL Take 1 tablet by mouth at bedtime as needed for sleep.   triamterene-hydrochlorothiazide 37.5-25 MG capsule Commonly known as:  DYAZIDE Take 1 capsule by mouth daily.      Verbal and written Discharge instructions given to the patient. Wound care per Discharge AVS Follow-up Information    Stegmayer, Janalyn Harder, PA-C Follow up in 2 week(s).   Specialty:  Physician Assistant Why:  for wound check/staple removal Contact information: [address] [place] [address] [phone]           Signed: Leotis Pain, MD  12/21/2017, 2:53 PM

## 2017-12-21 NOTE — Care Management Important Message (Signed)
Important Message  Patient Details  Name: Devin Washington MRN: [id number] Date of Birth: 1923-10-15   Medicare Important Message Given:  Yes    Beverly Sessions, RN 12/21/2017, 3:41 PM

## 2017-12-21 NOTE — Progress Notes (Signed)
Discharge order received. Patient is alert and oriented. Vital signs stable . No signs of acute distress. Discharge instructions given. Patient verbalized understanding. No other issues noted at this time.   

## 2017-12-22 DIAGNOSIS — Z48812 Encounter for surgical aftercare following surgery on the circulatory system: Secondary | ICD-10-CM | POA: Diagnosis not present

## 2017-12-22 DIAGNOSIS — I429 Cardiomyopathy, unspecified: Secondary | ICD-10-CM | POA: Diagnosis not present

## 2017-12-22 DIAGNOSIS — I4891 Unspecified atrial fibrillation: Secondary | ICD-10-CM | POA: Diagnosis not present

## 2017-12-22 DIAGNOSIS — I110 Hypertensive heart disease with heart failure: Secondary | ICD-10-CM | POA: Diagnosis not present

## 2017-12-22 DIAGNOSIS — I739 Peripheral vascular disease, unspecified: Secondary | ICD-10-CM | POA: Diagnosis not present

## 2017-12-22 DIAGNOSIS — N183 Chronic kidney disease, stage 3 (moderate): Secondary | ICD-10-CM | POA: Diagnosis not present

## 2017-12-22 DIAGNOSIS — M6281 Muscle weakness (generalized): Secondary | ICD-10-CM | POA: Diagnosis not present

## 2017-12-22 DIAGNOSIS — E43 Unspecified severe protein-calorie malnutrition: Secondary | ICD-10-CM | POA: Diagnosis not present

## 2017-12-22 DIAGNOSIS — M1991 Primary osteoarthritis, unspecified site: Secondary | ICD-10-CM | POA: Diagnosis not present

## 2017-12-22 DIAGNOSIS — I509 Heart failure, unspecified: Secondary | ICD-10-CM | POA: Diagnosis not present

## 2017-12-24 DIAGNOSIS — E43 Unspecified severe protein-calorie malnutrition: Secondary | ICD-10-CM | POA: Diagnosis not present

## 2017-12-24 DIAGNOSIS — M1991 Primary osteoarthritis, unspecified site: Secondary | ICD-10-CM | POA: Diagnosis not present

## 2017-12-24 DIAGNOSIS — I110 Hypertensive heart disease with heart failure: Secondary | ICD-10-CM | POA: Diagnosis not present

## 2017-12-24 DIAGNOSIS — Z48812 Encounter for surgical aftercare following surgery on the circulatory system: Secondary | ICD-10-CM | POA: Diagnosis not present

## 2017-12-24 DIAGNOSIS — I4891 Unspecified atrial fibrillation: Secondary | ICD-10-CM | POA: Diagnosis not present

## 2017-12-24 DIAGNOSIS — I429 Cardiomyopathy, unspecified: Secondary | ICD-10-CM | POA: Diagnosis not present

## 2017-12-24 DIAGNOSIS — I509 Heart failure, unspecified: Secondary | ICD-10-CM | POA: Diagnosis not present

## 2017-12-24 DIAGNOSIS — N183 Chronic kidney disease, stage 3 (moderate): Secondary | ICD-10-CM | POA: Diagnosis not present

## 2017-12-24 DIAGNOSIS — M6281 Muscle weakness (generalized): Secondary | ICD-10-CM | POA: Diagnosis not present

## 2017-12-24 DIAGNOSIS — I739 Peripheral vascular disease, unspecified: Secondary | ICD-10-CM | POA: Diagnosis not present

## 2017-12-30 DIAGNOSIS — I509 Heart failure, unspecified: Secondary | ICD-10-CM | POA: Diagnosis not present

## 2017-12-30 DIAGNOSIS — Z48812 Encounter for surgical aftercare following surgery on the circulatory system: Secondary | ICD-10-CM | POA: Diagnosis not present

## 2017-12-30 DIAGNOSIS — I110 Hypertensive heart disease with heart failure: Secondary | ICD-10-CM | POA: Diagnosis not present

## 2017-12-30 DIAGNOSIS — I4891 Unspecified atrial fibrillation: Secondary | ICD-10-CM | POA: Diagnosis not present

## 2017-12-30 DIAGNOSIS — M6281 Muscle weakness (generalized): Secondary | ICD-10-CM | POA: Diagnosis not present

## 2017-12-30 DIAGNOSIS — M1991 Primary osteoarthritis, unspecified site: Secondary | ICD-10-CM | POA: Diagnosis not present

## 2017-12-30 DIAGNOSIS — I739 Peripheral vascular disease, unspecified: Secondary | ICD-10-CM | POA: Diagnosis not present

## 2017-12-30 DIAGNOSIS — I429 Cardiomyopathy, unspecified: Secondary | ICD-10-CM | POA: Diagnosis not present

## 2017-12-30 DIAGNOSIS — N183 Chronic kidney disease, stage 3 (moderate): Secondary | ICD-10-CM | POA: Diagnosis not present

## 2017-12-30 DIAGNOSIS — E43 Unspecified severe protein-calorie malnutrition: Secondary | ICD-10-CM | POA: Diagnosis not present

## 2017-12-31 DIAGNOSIS — I4891 Unspecified atrial fibrillation: Secondary | ICD-10-CM | POA: Diagnosis not present

## 2017-12-31 DIAGNOSIS — I110 Hypertensive heart disease with heart failure: Secondary | ICD-10-CM | POA: Diagnosis not present

## 2017-12-31 DIAGNOSIS — I509 Heart failure, unspecified: Secondary | ICD-10-CM | POA: Diagnosis not present

## 2017-12-31 DIAGNOSIS — E43 Unspecified severe protein-calorie malnutrition: Secondary | ICD-10-CM | POA: Diagnosis not present

## 2017-12-31 DIAGNOSIS — M6281 Muscle weakness (generalized): Secondary | ICD-10-CM | POA: Diagnosis not present

## 2017-12-31 DIAGNOSIS — M1991 Primary osteoarthritis, unspecified site: Secondary | ICD-10-CM | POA: Diagnosis not present

## 2017-12-31 DIAGNOSIS — Z48812 Encounter for surgical aftercare following surgery on the circulatory system: Secondary | ICD-10-CM | POA: Diagnosis not present

## 2017-12-31 DIAGNOSIS — I429 Cardiomyopathy, unspecified: Secondary | ICD-10-CM | POA: Diagnosis not present

## 2017-12-31 DIAGNOSIS — I739 Peripheral vascular disease, unspecified: Secondary | ICD-10-CM | POA: Diagnosis not present

## 2017-12-31 DIAGNOSIS — N183 Chronic kidney disease, stage 3 (moderate): Secondary | ICD-10-CM | POA: Diagnosis not present

## 2018-01-01 ENCOUNTER — Ambulatory Visit (INDEPENDENT_AMBULATORY_CARE_PROVIDER_SITE_OTHER): Payer: Medicare HMO | Admitting: Vascular Surgery

## 2018-01-01 ENCOUNTER — Encounter (INDEPENDENT_AMBULATORY_CARE_PROVIDER_SITE_OTHER): Payer: Self-pay | Admitting: Vascular Surgery

## 2018-01-01 VITALS — BP 133/65 | HR 86 | Resp 16 | Ht 69.0 in | Wt 184.0 lb

## 2018-01-01 DIAGNOSIS — I724 Aneurysm of artery of lower extremity: Secondary | ICD-10-CM

## 2018-01-01 DIAGNOSIS — I10 Essential (primary) hypertension: Secondary | ICD-10-CM

## 2018-01-01 NOTE — Assessment & Plan Note (Signed)
blood pressure control important in reducing the progression of atherosclerotic disease. On appropriate oral medications.  

## 2018-01-01 NOTE — Progress Notes (Signed)
Patient ID: Devin Washington, male   DOB: 1923-03-18, 84 y.o.   MRN: [id number]  Chief Complaint  Patient presents with  . Follow-up    HPI Devin Washington is a 84 y.o. male.  Patient returns about 2 weeks after hospital discharge and surgery for a left popliteal artery aneurysm which was massive.  He had an open surgical repair as his anatomy was not feasible to have an endovascular repair.  This was a very difficult and complex aneurysm, but he did very well postoperatively.  His incisions all look great.  We took out about half of his staples from his thigh and calf incision.  His groin incisions are healing well.  His swelling is actually improving in his left leg.  He had dramatic swelling prior to the procedure due to the large size of the 7 cm popliteal aneurysm.  His activity is returning to normal and he is working well with physical therapy.   Past Medical History:  Diagnosis Date  . A-fib ([place])   . Cardiomyopathy ([place])   . CHF (congestive heart failure) ([place])   . CKD (chronic kidney disease), stage III ([place])   . DJD (degenerative joint disease)   . Duodenal ulcer 08/09/2015  . Dysrhythmia   . Erosive esophagitis   . HH (hiatus hernia)   . HLD (hyperlipidemia)   . Hypertension   . Peripheral vascular disease ([place])   . Presence of permanent cardiac pacemaker   . Prostate cancer [facility])    Prostatectomy.  . 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 Shortness of breath   . SSS (sick sinus syndrome) [facility])     Past Surgical History:  Procedure Laterality Date  . CATARACT EXTRACTION, BILATERAL    . ESOPHAGOGASTRODUODENOSCOPY N/A 08/09/2015   Procedure: ESOPHAGOGASTRODUODENOSCOPY (EGD) looking in the stomach with a lighted tube to evaluate and treat;  Surgeon: Lollie Sails, MD;  Location: [facility] ENDOSCOPY;  Service: Endoscopy;  Laterality: N/A;  Procedure to be done tomorrow afternoon, 08/08/2015. Awaiting cardiology consult  . EYE SURGERY    . FEMORAL-POPLITEAL BYPASS GRAFT Left  12/16/2017   Procedure: BYPASS GRAFT FEMORAL-POPLITEAL ARTERY ( OPEN POPLITEAL BYPASS );  Surgeon: Algernon Huxley, MD;  Location: [facility] ORS;  Service: Vascular;  Laterality: Left;  . FLEXIBLE SIGMOIDOSCOPY N/A 08/09/2015   Procedure: FLEXIBLE SIGMOIDOSCOPY looking up the rectum into the distal colon with a lighted tube to examine and treat;  Surgeon: Lollie Sails, MD;  Location: [facility] ENDOSCOPY;  Service: Endoscopy;  Laterality: N/A;  Procedure to be done tomorrow afternoon, 08/08/2015. Awaiting cardiology consult.  . INSERT / REPLACE / REMOVE PACEMAKER    . JOINT REPLACEMENT Left    left knee replacement  . LOWER EXTREMITY ANGIOGRAPHY Left 11/16/2017   Procedure: LOWER EXTREMITY ANGIOGRAPHY;  Surgeon: Algernon Huxley, MD;  Location: [place] CV LAB;  Service: Cardiovascular;  Laterality: Left;  . LOWER EXTREMITY ANGIOGRAPHY Right 11/30/2017   Procedure: LOWER EXTREMITY ANGIOGRAPHY;  Surgeon: Algernon Huxley, MD;  Location: [place] CV LAB;  Service: Cardiovascular;  Laterality: Right;  . PACEMAKER INSERTION    . PROSTATE SURGERY        No Known Allergies  Current Outpatient Medications  Medication Sig Dispense Refill  . albuterol (PROVENTIL HFA;VENTOLIN HFA) 108 (90 Base) MCG/ACT inhaler Inhale 2 puffs into the lungs every 6 (six) hours as needed for wheezing or shortness of breath. 1 Inhaler 2  . ANORO ELLIPTA 62.5-25 MCG/INH AEPB Inhale 1 puff into the lungs  2 (two) times daily.    Marland Kitchen aspirin EC 81 MG EC tablet Take 1 tablet (81 mg total) by mouth daily. 30 tablet 3  . benazepril (LOTENSIN) 40 MG tablet Take 40 mg by mouth daily.    . carvedilol (COREG) 3.125 MG tablet Take 1 tablet by mouth 2 (two) times daily.    . clopidogrel (PLAVIX) 75 MG tablet Take 1 tablet (75 mg total) by mouth daily. 30 tablet 3  . docusate sodium (COLACE) 100 MG capsule Take 100 mg by mouth every morning.    . furosemide (LASIX) 20 MG tablet Take 1 tablet by mouth daily.    . pantoprazole (PROTONIX) 40 MG  tablet Take 40 mg by mouth daily.    . predniSONE (DELTASONE) 20 MG tablet Take 20 mg by mouth daily with breakfast.    . simvastatin (ZOCOR) 20 MG tablet Take 20 mg by mouth daily.    . traMADol (ULTRAM) 50 MG tablet Take 1 tablet (50 mg total) by mouth every 6 (six) hours as needed for moderate pain or severe pain. 20 tablet 1  . traZODone (DESYREL) 50 MG tablet Take 1 tablet by mouth at bedtime as needed for sleep.     Marland Kitchen triamterene-hydrochlorothiazide (DYAZIDE) 37.5-25 MG capsule Take 1 capsule by mouth daily.     No current facility-administered medications for this visit.         Physical Exam BP 133/65   Pulse 86   Resp 16   Ht 5\' 9"  (1.753 m)   Wt 83.5 kg (184 lb)   BMI 27.17 kg/m  Gen:  WD/WN, NAD Skin: incision C/D/I     Assessment/Plan:  HTN (hypertension) blood pressure control important in reducing the progression of atherosclerotic disease. On appropriate oral medications.   Bilateral popliteal artery aneurysm (HCC) Right popliteal aneurysm was fixed with a stent graft.  Left popliteal aneurysm was fixed with an open repair.  Overall doing well.  Return in about a week to 2 weeks to remove the remainder of the staples.  We will want to get bilateral lower extremity duplex and ABIs in 2-3 months.      Leotis Pain 01/01/2018, 1:13 PM   This note was created with Dragon medical transcription system.  Any errors from dictation are unintentional.

## 2018-01-01 NOTE — Assessment & Plan Note (Signed)
Right popliteal aneurysm was fixed with a stent graft.  Left popliteal aneurysm was fixed with an open repair.  Overall doing well.  Return in about a week to 2 weeks to remove the remainder of the staples.  We will want to get bilateral lower extremity duplex and ABIs in 2-3 months.

## 2018-01-04 ENCOUNTER — Ambulatory Visit (INDEPENDENT_AMBULATORY_CARE_PROVIDER_SITE_OTHER): Payer: Medicare HMO | Admitting: Vascular Surgery

## 2018-01-04 DIAGNOSIS — M1991 Primary osteoarthritis, unspecified site: Secondary | ICD-10-CM | POA: Diagnosis not present

## 2018-01-04 DIAGNOSIS — I110 Hypertensive heart disease with heart failure: Secondary | ICD-10-CM | POA: Diagnosis not present

## 2018-01-04 DIAGNOSIS — I739 Peripheral vascular disease, unspecified: Secondary | ICD-10-CM | POA: Diagnosis not present

## 2018-01-04 DIAGNOSIS — I4891 Unspecified atrial fibrillation: Secondary | ICD-10-CM | POA: Diagnosis not present

## 2018-01-04 DIAGNOSIS — N183 Chronic kidney disease, stage 3 (moderate): Secondary | ICD-10-CM | POA: Diagnosis not present

## 2018-01-04 DIAGNOSIS — Z48812 Encounter for surgical aftercare following surgery on the circulatory system: Secondary | ICD-10-CM | POA: Diagnosis not present

## 2018-01-04 DIAGNOSIS — I429 Cardiomyopathy, unspecified: Secondary | ICD-10-CM | POA: Diagnosis not present

## 2018-01-04 DIAGNOSIS — E43 Unspecified severe protein-calorie malnutrition: Secondary | ICD-10-CM | POA: Diagnosis not present

## 2018-01-04 DIAGNOSIS — M6281 Muscle weakness (generalized): Secondary | ICD-10-CM | POA: Diagnosis not present

## 2018-01-04 DIAGNOSIS — I509 Heart failure, unspecified: Secondary | ICD-10-CM | POA: Diagnosis not present

## 2018-01-06 DIAGNOSIS — N183 Chronic kidney disease, stage 3 (moderate): Secondary | ICD-10-CM | POA: Diagnosis not present

## 2018-01-06 DIAGNOSIS — I429 Cardiomyopathy, unspecified: Secondary | ICD-10-CM | POA: Diagnosis not present

## 2018-01-06 DIAGNOSIS — I509 Heart failure, unspecified: Secondary | ICD-10-CM | POA: Diagnosis not present

## 2018-01-06 DIAGNOSIS — Z48812 Encounter for surgical aftercare following surgery on the circulatory system: Secondary | ICD-10-CM | POA: Diagnosis not present

## 2018-01-06 DIAGNOSIS — I4891 Unspecified atrial fibrillation: Secondary | ICD-10-CM | POA: Diagnosis not present

## 2018-01-06 DIAGNOSIS — I110 Hypertensive heart disease with heart failure: Secondary | ICD-10-CM | POA: Diagnosis not present

## 2018-01-06 DIAGNOSIS — M6281 Muscle weakness (generalized): Secondary | ICD-10-CM | POA: Diagnosis not present

## 2018-01-06 DIAGNOSIS — M1991 Primary osteoarthritis, unspecified site: Secondary | ICD-10-CM | POA: Diagnosis not present

## 2018-01-06 DIAGNOSIS — I739 Peripheral vascular disease, unspecified: Secondary | ICD-10-CM | POA: Diagnosis not present

## 2018-01-06 DIAGNOSIS — E43 Unspecified severe protein-calorie malnutrition: Secondary | ICD-10-CM | POA: Diagnosis not present

## 2018-01-08 ENCOUNTER — Encounter (INDEPENDENT_AMBULATORY_CARE_PROVIDER_SITE_OTHER): Payer: Self-pay | Admitting: Vascular Surgery

## 2018-01-08 ENCOUNTER — Ambulatory Visit (INDEPENDENT_AMBULATORY_CARE_PROVIDER_SITE_OTHER): Payer: Medicare HMO | Admitting: Vascular Surgery

## 2018-01-08 VITALS — BP 160/77 | HR 88 | Resp 18 | Ht 68.0 in | Wt 184.0 lb

## 2018-01-08 DIAGNOSIS — I723 Aneurysm of iliac artery: Secondary | ICD-10-CM

## 2018-01-08 DIAGNOSIS — M6281 Muscle weakness (generalized): Secondary | ICD-10-CM | POA: Diagnosis not present

## 2018-01-08 DIAGNOSIS — I509 Heart failure, unspecified: Secondary | ICD-10-CM | POA: Diagnosis not present

## 2018-01-08 DIAGNOSIS — N183 Chronic kidney disease, stage 3 (moderate): Secondary | ICD-10-CM | POA: Diagnosis not present

## 2018-01-08 DIAGNOSIS — I729 Aneurysm of unspecified site: Secondary | ICD-10-CM

## 2018-01-08 DIAGNOSIS — Z48812 Encounter for surgical aftercare following surgery on the circulatory system: Secondary | ICD-10-CM | POA: Diagnosis not present

## 2018-01-08 NOTE — Progress Notes (Signed)
Subjective:    Patient ID: Devin Washington, male    DOB: [birth year], 84 y.o.   MRN: [id number] Chief Complaint  Patient presents with  . Follow-up    1 week staple remaoval   Patient presents for his second staple removal.  The patient was last seen approximately 1 week ago.  The patient presents today without complaint.  The patient denies any claudication-like symptoms, rest pain or ulceration to the left lower extremity.  The patient denies any issues with his incision.  The patient does note some swelling to the left lower extremity.  The patient denies any fever, nausea vomiting.   Review of Systems  Constitutional: Negative.   HENT: Negative.   Eyes: Negative.   Respiratory: Negative.   Cardiovascular:       Lower extremity arterial aneurysm  Gastrointestinal: Negative.   Endocrine: Negative.   Genitourinary: Negative.   Musculoskeletal: Negative.   Skin: Negative.   Allergic/Immunologic: Negative.   Neurological: Negative.   Hematological: Negative.   Psychiatric/Behavioral: Negative.       Objective:   Physical Exam  Constitutional: He appears well-developed and well-nourished. No distress.  HENT:  Head: Normocephalic and atraumatic.  Eyes: Conjunctivae are normal. Pupils are equal, round, and reactive to light.  Neck: Normal range of motion.  Cardiovascular: Normal rate, regular rhythm, normal heart sounds and intact distal pulses.  Pulses:      Radial pulses are 2+ on the right side, and 2+ on the left side.  Hard to palpate left pedal pulses due to some edema however the left foot is warm  Pulmonary/Chest: Effort normal and breath sounds normal.  Musculoskeletal: Normal range of motion. He exhibits edema (Mild to moderate left lower extremity nonpitting edema noted.).  Neurological: He is alert.  Skin: Skin is warm and dry. He is not diaphoretic.  Remaining staples removed from left lower extremity incision.  Incision is intact.  There is no signs of  infection.  Psychiatric: He has a normal mood and affect. His behavior is normal. Judgment and thought content normal.  Vitals reviewed.  BP (!) 160/77 (BP Location: Right Arm, Patient Position: Sitting)   Pulse 88   Resp 18   Ht 5\' 8"  (1.727 m)   Wt 184 lb (83.5 kg)   BMI 27.98 kg/m   Past Medical History:  Diagnosis Date  . A-fib ([place])   . Cardiomyopathy ([place])   . CHF (congestive heart failure) ([place])   . CKD (chronic kidney disease), stage III ([place])   . DJD (degenerative joint disease)   . Duodenal ulcer 08/09/2015  . Dysrhythmia   . Erosive esophagitis   . HH (hiatus hernia)   . HLD (hyperlipidemia)   . Hypertension   . Peripheral vascular disease ([place])   . Presence of permanent cardiac pacemaker   . Prostate cancer [facility])    Prostatectomy.  . 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 Shortness of breath   . SSS (sick sinus syndrome) ([facility])    Social History   Socioeconomic History  . Marital status: Widowed    Spouse name: Not on file  . Number of children: Not on file  . Years of education: Not on file  . Highest education level: Not on file  Social Needs  . Financial resource strain: Not on file  . Food insecurity - worry: Not on file  . Food insecurity - inability: Not on file  . Transportation needs - medical: Not on file  . Transportation needs - non-medical:  Not on file  Occupational History  . Not on file  Tobacco Use  . Smoking status: Former Research scientist (life sciences)  . Smokeless tobacco: Former Network engineer and Sexual Activity  . Alcohol use: No    Alcohol/week: 0.0 oz  . Drug use: No  . Sexual activity: Not Currently  Other Topics Concern  . Not on file  Social History Narrative  . Not on file   Past Surgical History:  Procedure Laterality Date  . CATARACT EXTRACTION, BILATERAL    . ESOPHAGOGASTRODUODENOSCOPY N/A 08/09/2015   Procedure: ESOPHAGOGASTRODUODENOSCOPY (EGD) looking in the stomach with a lighted tube to evaluate and treat;  Surgeon: Lollie Sails, MD;   Location[facility] ENDOSCOPY;  Service: Endoscopy;  Laterality: N/A;  Procedure to be done tomorrow afternoon, 08/08/2015. Awaiting cardiology consult  . EYE SURGERY    . FEMORAL-POPLITEAL BYPASS GRAFT Left 12/16/2017   Procedure: BYPASS GRAFT FEMORAL-POPLITEAL ARTERY ( OPEN POPLITEAL BYPASS );  Surgeon: Algernon Huxley, MD;  Location: [facility] ORS;  Service: Vascular;  Laterality: Left;  . FLEXIBLE SIGMOIDOSCOPY N/A 08/09/2015   Procedure: FLEXIBLE SIGMOIDOSCOPY looking up the rectum into the distal colon with a lighted tube to examine and treat;  Surgeon: Lollie Sails, MD;  Location: [facility] ENDOSCOPY;  Service: Endoscopy;  Laterality: N/A;  Procedure to be done tomorrow afternoon, 08/08/2015. Awaiting cardiology consult.  . INSERT / REPLACE / REMOVE PACEMAKER    . JOINT REPLACEMENT Left    left knee replacement  . LOWER EXTREMITY ANGIOGRAPHY Left 11/16/2017   Procedure: LOWER EXTREMITY ANGIOGRAPHY;  Surgeon: Algernon Huxley, MD;  Location: [place] CV LAB;  Service: Cardiovascular;  Laterality: Left;  . LOWER EXTREMITY ANGIOGRAPHY Right 11/30/2017   Procedure: LOWER EXTREMITY ANGIOGRAPHY;  Surgeon: Algernon Huxley, MD;  Location: [place] CV LAB;  Service: Cardiovascular;  Laterality: Right;  . PACEMAKER INSERTION    . PROSTATE SURGERY     Family History  Family history unknown: Yes   No Known Allergies     Assessment & Plan:  Patient presents for his second staple removal.  The patient was last seen approximately 1 week ago.  The patient presents today without complaint.  The patient denies any claudication-like symptoms, rest pain or ulceration to the left lower extremity.  The patient denies any issues with his incision.  The patient does note some swelling to the left lower extremity.  The patient denies any fever, nausea vomiting.  1. 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 Right popliteal aneurysm was fixed with a stent graft. The  patient continues to do well postoperatively. He presents today without complaint and unremarkable physical exam The patient's staples were removed without any complication and Steri-Strips were placed Hard to palpate pedal pulses due to some edema however his left foot is warm. 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  albuterol (PROVENTIL HFA;VENTOLIN HFA) 108 (90 Base) MCG/ACT inhaler Inhale 2 puffs into the lungs every 6 (six) hours as needed for wheezing or shortness of breath. 1 Inhaler 2  . ANORO ELLIPTA 62.5-25 MCG/INH AEPB Inhale 1 puff into the lungs 2 (two) times daily.    Marland Kitchen aspirin EC 81 MG EC tablet Take 1 tablet (81 mg total) by mouth daily. 30 tablet 3  . benazepril (LOTENSIN) 40 MG tablet Take 40 mg by mouth daily.    . carvedilol (COREG) 3.125 MG tablet Take 1 tablet by mouth 2 (two) times daily.    . clopidogrel (PLAVIX) 75 MG tablet Take 1 tablet (75 mg total) by mouth daily. 30 tablet 3  . docusate sodium (COLACE) 100 MG capsule Take 100 mg by mouth every morning.    . furosemide (LASIX) 20 MG tablet Take 1 tablet by mouth daily.    . pantoprazole (PROTONIX) 40 MG tablet Take 40 mg by mouth daily.    . predniSONE (DELTASONE) 20 MG tablet Take 20 mg by mouth daily with breakfast.    . simvastatin (ZOCOR) 20 MG tablet Take 20 mg by mouth daily.    . traMADol (ULTRAM) 50  MG tablet Take 1 tablet (50 mg total) by mouth every 6 (six) hours as needed for moderate pain or severe pain. 20 tablet 1  . traZODone (DESYREL) 50 MG tablet Take 1 tablet by mouth at bedtime as needed for sleep.     Marland Kitchen triamterene-hydrochlorothiazide (DYAZIDE) 37.5-25 MG capsule Take 1 capsule by mouth daily.     No current facility-administered medications on file prior to visit.    There are no Patient Instructions on file for this visit. No Follow-up on file.  KIMBERLY A STEGMAYER, PA-C

## 2018-01-11 DIAGNOSIS — M1991 Primary osteoarthritis, unspecified site: Secondary | ICD-10-CM | POA: Diagnosis not present

## 2018-01-11 DIAGNOSIS — Z48812 Encounter for surgical aftercare following surgery on the circulatory system: Secondary | ICD-10-CM | POA: Diagnosis not present

## 2018-01-11 DIAGNOSIS — I110 Hypertensive heart disease with heart failure: Secondary | ICD-10-CM | POA: Diagnosis not present

## 2018-01-11 DIAGNOSIS — I509 Heart failure, unspecified: Secondary | ICD-10-CM | POA: Diagnosis not present

## 2018-01-11 DIAGNOSIS — N183 Chronic kidney disease, stage 3 (moderate): Secondary | ICD-10-CM | POA: Diagnosis not present

## 2018-01-11 DIAGNOSIS — I739 Peripheral vascular disease, unspecified: Secondary | ICD-10-CM | POA: Diagnosis not present

## 2018-01-11 DIAGNOSIS — M6281 Muscle weakness (generalized): Secondary | ICD-10-CM | POA: Diagnosis not present

## 2018-01-11 DIAGNOSIS — I429 Cardiomyopathy, unspecified: Secondary | ICD-10-CM | POA: Diagnosis not present

## 2018-01-11 DIAGNOSIS — I4891 Unspecified atrial fibrillation: Secondary | ICD-10-CM | POA: Diagnosis not present

## 2018-01-11 DIAGNOSIS — E43 Unspecified severe protein-calorie malnutrition: Secondary | ICD-10-CM | POA: Diagnosis not present

## 2018-01-12 DIAGNOSIS — I10 Essential (primary) hypertension: Secondary | ICD-10-CM | POA: Diagnosis not present

## 2018-01-12 DIAGNOSIS — E785 Hyperlipidemia, unspecified: Secondary | ICD-10-CM | POA: Diagnosis not present

## 2018-01-12 DIAGNOSIS — J449 Chronic obstructive pulmonary disease, unspecified: Secondary | ICD-10-CM | POA: Diagnosis not present

## 2018-01-13 DIAGNOSIS — Z48812 Encounter for surgical aftercare following surgery on the circulatory system: Secondary | ICD-10-CM | POA: Diagnosis not present

## 2018-01-13 DIAGNOSIS — I739 Peripheral vascular disease, unspecified: Secondary | ICD-10-CM | POA: Diagnosis not present

## 2018-01-13 DIAGNOSIS — E43 Unspecified severe protein-calorie malnutrition: Secondary | ICD-10-CM | POA: Diagnosis not present

## 2018-01-13 DIAGNOSIS — I429 Cardiomyopathy, unspecified: Secondary | ICD-10-CM | POA: Diagnosis not present

## 2018-01-13 DIAGNOSIS — M1991 Primary osteoarthritis, unspecified site: Secondary | ICD-10-CM | POA: Diagnosis not present

## 2018-01-13 DIAGNOSIS — I110 Hypertensive heart disease with heart failure: Secondary | ICD-10-CM | POA: Diagnosis not present

## 2018-01-13 DIAGNOSIS — M6281 Muscle weakness (generalized): Secondary | ICD-10-CM | POA: Diagnosis not present

## 2018-01-13 DIAGNOSIS — I4891 Unspecified atrial fibrillation: Secondary | ICD-10-CM | POA: Diagnosis not present

## 2018-01-13 DIAGNOSIS — I509 Heart failure, unspecified: Secondary | ICD-10-CM | POA: Diagnosis not present

## 2018-01-13 DIAGNOSIS — N183 Chronic kidney disease, stage 3 (moderate): Secondary | ICD-10-CM | POA: Diagnosis not present

## 2018-01-14 DIAGNOSIS — I509 Heart failure, unspecified: Secondary | ICD-10-CM | POA: Diagnosis not present

## 2018-01-14 DIAGNOSIS — I429 Cardiomyopathy, unspecified: Secondary | ICD-10-CM | POA: Diagnosis not present

## 2018-01-14 DIAGNOSIS — I110 Hypertensive heart disease with heart failure: Secondary | ICD-10-CM | POA: Diagnosis not present

## 2018-01-14 DIAGNOSIS — N183 Chronic kidney disease, stage 3 (moderate): Secondary | ICD-10-CM | POA: Diagnosis not present

## 2018-01-14 DIAGNOSIS — Z48812 Encounter for surgical aftercare following surgery on the circulatory system: Secondary | ICD-10-CM | POA: Diagnosis not present

## 2018-01-14 DIAGNOSIS — I4891 Unspecified atrial fibrillation: Secondary | ICD-10-CM | POA: Diagnosis not present

## 2018-01-14 DIAGNOSIS — I739 Peripheral vascular disease, unspecified: Secondary | ICD-10-CM | POA: Diagnosis not present

## 2018-01-14 DIAGNOSIS — M6281 Muscle weakness (generalized): Secondary | ICD-10-CM | POA: Diagnosis not present

## 2018-01-14 DIAGNOSIS — E43 Unspecified severe protein-calorie malnutrition: Secondary | ICD-10-CM | POA: Diagnosis not present

## 2018-01-14 DIAGNOSIS — M1991 Primary osteoarthritis, unspecified site: Secondary | ICD-10-CM | POA: Diagnosis not present

## 2018-01-18 DIAGNOSIS — Z48812 Encounter for surgical aftercare following surgery on the circulatory system: Secondary | ICD-10-CM | POA: Diagnosis not present

## 2018-01-18 DIAGNOSIS — E43 Unspecified severe protein-calorie malnutrition: Secondary | ICD-10-CM | POA: Diagnosis not present

## 2018-01-18 DIAGNOSIS — I429 Cardiomyopathy, unspecified: Secondary | ICD-10-CM | POA: Diagnosis not present

## 2018-01-18 DIAGNOSIS — I509 Heart failure, unspecified: Secondary | ICD-10-CM | POA: Diagnosis not present

## 2018-01-18 DIAGNOSIS — M6281 Muscle weakness (generalized): Secondary | ICD-10-CM | POA: Diagnosis not present

## 2018-01-18 DIAGNOSIS — I739 Peripheral vascular disease, unspecified: Secondary | ICD-10-CM | POA: Diagnosis not present

## 2018-01-18 DIAGNOSIS — N183 Chronic kidney disease, stage 3 (moderate): Secondary | ICD-10-CM | POA: Diagnosis not present

## 2018-01-18 DIAGNOSIS — M1991 Primary osteoarthritis, unspecified site: Secondary | ICD-10-CM | POA: Diagnosis not present

## 2018-01-18 DIAGNOSIS — I4891 Unspecified atrial fibrillation: Secondary | ICD-10-CM | POA: Diagnosis not present

## 2018-01-18 DIAGNOSIS — I110 Hypertensive heart disease with heart failure: Secondary | ICD-10-CM | POA: Diagnosis not present

## 2018-01-19 DIAGNOSIS — E43 Unspecified severe protein-calorie malnutrition: Secondary | ICD-10-CM | POA: Diagnosis not present

## 2018-01-19 DIAGNOSIS — N183 Chronic kidney disease, stage 3 (moderate): Secondary | ICD-10-CM | POA: Diagnosis not present

## 2018-01-19 DIAGNOSIS — I4891 Unspecified atrial fibrillation: Secondary | ICD-10-CM | POA: Diagnosis not present

## 2018-01-19 DIAGNOSIS — I739 Peripheral vascular disease, unspecified: Secondary | ICD-10-CM | POA: Diagnosis not present

## 2018-01-19 DIAGNOSIS — I509 Heart failure, unspecified: Secondary | ICD-10-CM | POA: Diagnosis not present

## 2018-01-19 DIAGNOSIS — M1991 Primary osteoarthritis, unspecified site: Secondary | ICD-10-CM | POA: Diagnosis not present

## 2018-01-19 DIAGNOSIS — I429 Cardiomyopathy, unspecified: Secondary | ICD-10-CM | POA: Diagnosis not present

## 2018-01-19 DIAGNOSIS — M6281 Muscle weakness (generalized): Secondary | ICD-10-CM | POA: Diagnosis not present

## 2018-01-19 DIAGNOSIS — Z48812 Encounter for surgical aftercare following surgery on the circulatory system: Secondary | ICD-10-CM | POA: Diagnosis not present

## 2018-01-19 DIAGNOSIS — I110 Hypertensive heart disease with heart failure: Secondary | ICD-10-CM | POA: Diagnosis not present

## 2018-01-27 DIAGNOSIS — M1991 Primary osteoarthritis, unspecified site: Secondary | ICD-10-CM | POA: Diagnosis not present

## 2018-01-27 DIAGNOSIS — I509 Heart failure, unspecified: Secondary | ICD-10-CM | POA: Diagnosis not present

## 2018-01-27 DIAGNOSIS — I429 Cardiomyopathy, unspecified: Secondary | ICD-10-CM | POA: Diagnosis not present

## 2018-01-27 DIAGNOSIS — I110 Hypertensive heart disease with heart failure: Secondary | ICD-10-CM | POA: Diagnosis not present

## 2018-01-27 DIAGNOSIS — Z48812 Encounter for surgical aftercare following surgery on the circulatory system: Secondary | ICD-10-CM | POA: Diagnosis not present

## 2018-01-27 DIAGNOSIS — I739 Peripheral vascular disease, unspecified: Secondary | ICD-10-CM | POA: Diagnosis not present

## 2018-01-27 DIAGNOSIS — M6281 Muscle weakness (generalized): Secondary | ICD-10-CM | POA: Diagnosis not present

## 2018-01-27 DIAGNOSIS — E43 Unspecified severe protein-calorie malnutrition: Secondary | ICD-10-CM | POA: Diagnosis not present

## 2018-01-27 DIAGNOSIS — I4891 Unspecified atrial fibrillation: Secondary | ICD-10-CM | POA: Diagnosis not present

## 2018-01-27 DIAGNOSIS — N183 Chronic kidney disease, stage 3 (moderate): Secondary | ICD-10-CM | POA: Diagnosis not present

## 2018-02-23 ENCOUNTER — Ambulatory Visit (INDEPENDENT_AMBULATORY_CARE_PROVIDER_SITE_OTHER): Payer: Medicare HMO

## 2018-02-23 ENCOUNTER — Encounter (INDEPENDENT_AMBULATORY_CARE_PROVIDER_SITE_OTHER): Payer: Self-pay | Admitting: Vascular Surgery

## 2018-02-23 ENCOUNTER — Ambulatory Visit (INDEPENDENT_AMBULATORY_CARE_PROVIDER_SITE_OTHER): Payer: Medicare HMO | Admitting: Vascular Surgery

## 2018-02-23 VITALS — BP 175/87 | HR 77 | Resp 15 | Ht 69.0 in | Wt 180.0 lb

## 2018-02-23 DIAGNOSIS — I10 Essential (primary) hypertension: Secondary | ICD-10-CM

## 2018-02-23 DIAGNOSIS — I723 Aneurysm of iliac artery: Secondary | ICD-10-CM

## 2018-02-23 DIAGNOSIS — I729 Aneurysm of unspecified site: Secondary | ICD-10-CM | POA: Diagnosis not present

## 2018-02-23 DIAGNOSIS — N183 Chronic kidney disease, stage 3 unspecified: Secondary | ICD-10-CM

## 2018-02-23 DIAGNOSIS — I724 Aneurysm of artery of lower extremity: Secondary | ICD-10-CM

## 2018-02-23 DIAGNOSIS — E785 Hyperlipidemia, unspecified: Secondary | ICD-10-CM

## 2018-02-23 NOTE — Progress Notes (Signed)
MRN : [id number]  Devin Washington is a 84 y.o. (30-Dec-1922) male who presents with chief complaint of  Chief Complaint  Patient presents with  . Follow-up    1 month LLE Arterial  .  History of Present Illness: Patient returns today in follow up of popliteal aneurysms.  His left leg remains swollen and heavy.  It initially got some better after his left femoral to distal bypass, but the swelling and pain have returned.  His incisions have healed.  He has no new ulcers or infections.  His duplex today shows an apparent occlusion of the distal portion of the left femoral to distal bypass graft with what appears to be compression from the very large left popliteal aneurysm.  Current Outpatient Medications  Medication Sig Dispense Refill  . albuterol (PROVENTIL HFA;VENTOLIN HFA) 108 (90 Base) MCG/ACT inhaler Inhale 2 puffs into the lungs every 6 (six) hours as needed for wheezing or shortness of breath. 1 Inhaler 2  . ANORO ELLIPTA 62.5-25 MCG/INH AEPB Inhale 1 puff into the lungs 2 (two) times daily.    Marland Kitchen aspirin EC 81 MG EC tablet Take 1 tablet (81 mg total) by mouth daily. 30 tablet 3  . benazepril (LOTENSIN) 20 MG tablet     . carvedilol (COREG) 3.125 MG tablet Take 1 tablet by mouth 2 (two) times daily.    . clopidogrel (PLAVIX) 75 MG tablet Take 1 tablet (75 mg total) by mouth daily. 30 tablet 3  . docusate sodium (COLACE) 100 MG capsule Take 100 mg by mouth every morning.    . furosemide (LASIX) 20 MG tablet Take 1 tablet by mouth daily.    . pantoprazole (PROTONIX) 40 MG tablet Take 40 mg by mouth daily.    . predniSONE (DELTASONE) 20 MG tablet Take 20 mg by mouth daily with breakfast.    . simvastatin (ZOCOR) 20 MG tablet Take 20 mg by mouth daily.    . traMADol (ULTRAM) 50 MG tablet Take 1 tablet (50 mg total) by mouth every 6 (six) hours as needed for moderate pain or severe pain. 20 tablet 1  . traZODone (DESYREL) 50 MG tablet Take 1 tablet by mouth at bedtime as needed for  sleep.     Marland Kitchen triamterene-hydrochlorothiazide (DYAZIDE) 37.5-25 MG capsule Take 1 capsule by mouth daily.     No current facility-administered medications for this visit.     Past Medical History:  Diagnosis Date  . A-fib ([place])   . Cardiomyopathy ([place])   . CHF (congestive heart failure) ([place])   . CKD (chronic kidney disease), stage III ([place])   . DJD (degenerative joint disease)   . Duodenal ulcer 08/09/2015  . Dysrhythmia   . Erosive esophagitis   . HH (hiatus hernia)   . HLD (hyperlipidemia)   . Hypertension   . Peripheral vascular disease ([place])   . Presence of permanent cardiac pacemaker   . Prostate cancer [facility])    Prostatectomy.  . 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 Shortness of breath   . SSS (sick sinus syndrome) [facility])     Past Surgical History:  Procedure Laterality Date  . CATARACT EXTRACTION, BILATERAL    . ESOPHAGOGASTRODUODENOSCOPY N/A 08/09/2015   Procedure: ESOPHAGOGASTRODUODENOSCOPY (EGD) looking in the stomach with a lighted tube to evaluate and treat;  Surgeon: Lollie Sails, MD;  Location: [facility] ENDOSCOPY;  Service: Endoscopy;  Laterality: N/A;  Procedure to be done tomorrow afternoon, 08/08/2015. Awaiting cardiology consult  . EYE SURGERY    .  FEMORAL-POPLITEAL BYPASS GRAFT Left 12/16/2017   Procedure: BYPASS GRAFT FEMORAL-POPLITEAL ARTERY ( OPEN POPLITEAL BYPASS );  Surgeon: Algernon Huxley, MD;  Location: [facility] ORS;  Service: Vascular;  Laterality: Left;  . FLEXIBLE SIGMOIDOSCOPY N/A 08/09/2015   Procedure: FLEXIBLE SIGMOIDOSCOPY looking up the rectum into the distal colon with a lighted tube to examine and treat;  Surgeon: Lollie Sails, MD;  Location: [facility] ENDOSCOPY;  Service: Endoscopy;  Laterality: N/A;  Procedure to be done tomorrow afternoon, 08/08/2015. Awaiting cardiology consult.  . INSERT / REPLACE / REMOVE PACEMAKER    . JOINT REPLACEMENT Left    left knee replacement  . LOWER EXTREMITY ANGIOGRAPHY Left 11/16/2017   Procedure: LOWER EXTREMITY  ANGIOGRAPHY;  Surgeon: Algernon Huxley, MD;  Location: [place] CV LAB;  Service: Cardiovascular;  Laterality: Left;  . LOWER EXTREMITY ANGIOGRAPHY Right 11/30/2017   Procedure: LOWER EXTREMITY ANGIOGRAPHY;  Surgeon: Algernon Huxley, MD;  Location: [place] CV LAB;  Service: Cardiovascular;  Laterality: Right;  . PACEMAKER INSERTION    . PROSTATE SURGERY      Social History Social History   Tobacco Use  . Smoking status: Former Research scientist (life sciences)  . Smokeless tobacco: Former Network engineer Use Topics  . Alcohol use: No    Alcohol/week: 0.0 oz  . Drug use: No     Family History No bleeding disorders, clotting disorders, autoimmune diseases, or porphyria  No Known Allergies   REVIEW OF SYSTEMS (Negative unless checked)  Constitutional: _0 Weight loss  _1 Fever  _2 Chills Cardiac: _3 Chest pain   _4 Chest pressure   _5 Palpitations   _6 Shortness of breath when laying flat   _7 Shortness of breath at rest   _8 Shortness of breath with exertion. Vascular:  _9 Pain in legs with walking   _10 Pain in legs at rest   _11 Pain in legs when laying flat   _12 Claudication   _13 Pain in feet when walking  _14 Pain in feet at rest  _15 Pain in feet when laying flat   _16 History of DVT   _17 Phlebitis   _18 Swelling in legs   _19 Varicose veins   _20 Non-healing ulcers Pulmonary:   _21 Uses home oxygen   _22 Productive cough   _23 Hemoptysis   _24 Wheeze  _25 COPD   _26 Asthma Neurologic:  _27 Dizziness  _28 Blackouts   _29 Seizures   _30 History of stroke   _31 History of TIA  _32 Aphasia   _33 Temporary blindness   _34 Dysphagia   _35 Weakness or numbness in arms   _36 Weakness or numbness in legs Musculoskeletal:  _37 Arthritis   _38 Joint swelling   _39 Joint pain   _40 Low back pain Hematologic:  _41 Easy bruising  _42 Easy bleeding   _43 Hypercoagulable state   _44 Anemic   Gastrointestinal:  _45 Blood in stool   _46 Vomiting blood  _47 Gastroesophageal reflux/heartburn   _48 Abdominal pain Genitourinary:  _49 Chronic kidney disease   _50 Difficult urination  _51 Frequent  urination  _52 Burning with urination   _53 Hematuria Skin:  _54 Rashes   _55 Ulcers   _56 Wounds Psychological:  _57 History of anxiety   _58  History of major depression.  Physical Examination  BP (!) 175/87 (BP Location: Right Arm, Patient Position: Sitting)   Pulse 77   Resp 15   Ht _59  (1.753 m)   Wt 81.6 kg (180 lb)   BMI 26.58 kg/m  Gen:  WD/WN, NAD Head: [place]/AT, No temporalis wasting. Ear/Nose/Throat: Hearing grossly intact, nares w/o erythema or drainage, trachea midline Eyes: Conjunctiva clear. Sclera non-icteric Neck: Supple.  No JVD.  Pulmonary:  Good air movement, no use of accessory muscles.  Cardiac: irregular Vascular:  Vessel Right  Left  Radial Palpable Palpable                      Popliteal Palpable Enlarged, Palpable  PT Not Palpable Not Palpable  DP 1+ Palpable Not Palpable    Musculoskeletal: M/S 5/5 throughout.  No deformity or atrophy. 1+ RLE swelling, 2-3+ LLE edema. Neurologic: Sensation grossly intact in extremities.  Symmetrical.  Speech is fluent.  Psychiatric: Judgment intact, Mood & affect appropriate for pt's clinical situation. Dermatologic: surgical incisions well healed      Labs Recent Results (from the past 2160 hour(s))  APTT     Status: None   Collection Time: 11/27/17  9:39 AM  Result Value Ref Range   aPTT 33 24 - 36 seconds  Basic metabolic panel     Status: Abnormal   Collection Time: 11/27/17  9:39 AM  Result Value Ref Range   Sodium 136 135 - 145 mmol/L   Potassium 3.7 3.5 - 5.1 mmol/L   Chloride 99 (L) 101 - 111 mmol/L   CO2 27 22 - 32 mmol/L   Glucose, Bld 107 (H) 65 - 99 mg/dL   BUN 21 (H) 6 - 20 mg/dL   Creatinine, Ser 1.55 (H) 0.61 - 1.24 mg/dL   Calcium 9.6 8.9 - 10.3 mg/dL   GFR calc non Af Amer 37 (L) >60 mL/min   GFR calc Af Amer 43 (L) >60 mL/min    Comment: (NOTE) The eGFR has been calculated using the CKD EPI equation. This calculation has not been validated in all clinical situations. eGFR's persistently <60  mL/min signify possible Chronic Kidney Disease.    Anion gap 10 5 - 15  CBC WITH DIFFERENTIAL     Status: Abnormal   Collection Time: 11/27/17  9:39 AM  Result Value Ref Range   WBC 10.5 3.8 - 10.6 K/uL   RBC 3.71 (L) 4.40 - 5.90 MIL/uL   Hemoglobin 10.3 (L) 13.0 - 18.0 g/dL   HCT 30.6 (L) 40.0 - 52.0 %   MCV 82.4 80.0 - 100.0 fL   MCH 27.8 26.0 - 34.0 pg   MCHC 33.7 32.0 - 36.0 g/dL   RDW 16.6 (H) 11.5 - 14.5 %   Platelets 231 150 - 440 K/uL   Neutrophils Relative % 76 %   Neutro Abs 8.1 (H) 1.4 - 6.5 K/uL   Lymphocytes Relative 13 %   Lymphs Abs 1.4 1.0 - 3.6 K/uL   Monocytes Relative 8 %   Monocytes Absolute 0.8 0.2 - 1.0 K/uL   Eosinophils Relative 2 %   Eosinophils Absolute 0.2 0 - 0.7 K/uL   Basophils Relative 1 %   Basophils Absolute 0.1 0 - 0.1 K/uL  Protime-INR     Status: None   Collection Time: 11/27/17  9:39 AM  Result Value Ref Range   Prothrombin Time 13.3 11.4 - 15.2 seconds   INR 1.02   Surgical pcr screen     Status: None   Collection Time: 11/27/17  9:39 AM  Result Value Ref Range   MRSA, PCR NEGATIVE NEGATIVE   Staphylococcus aureus NEGATIVE NEGATIVE    Comment: (NOTE) The Xpert SA Assay (FDA approved for NASAL specimens in patients 12 years of age and older), is one component of a comprehensive surveillance program. It is not intended to diagnose infection nor to guide or monitor treatment.   Type and screen     Status: None   Collection Time: 12/16/17  6:01 AM  Result Value  Ref Range   ABO/RH(D) O POS    Antibody Screen NEG    Sample Expiration      12/19/2017 Performed at [facility] [address] [place], [place] [address]   I-STAT 4, (NA,K, GLUC, HGB,HCT)     Status: Abnormal   Collection Time: 12/16/17  6:16 AM  Result Value Ref Range   Sodium 138 135 - 145 mmol/L   Potassium 4.1 3.5 - 5.1 mmol/L   Glucose, Bld 109 (H) 65 - 99 mg/dL   HCT 28.0 (L) 39.0 - 52.0 %   Hemoglobin 9.5 (L) 13.0 - 17.0 g/dL  Surgical pathology      Status: None   Collection Time: 12/16/17 12:43 PM  Result Value Ref Range   SURGICAL PATHOLOGY      Surgical Pathology CASE: [id number] PATIENT: Cecilio Asper Surgical Pathology Report     SPECIMEN SUBMITTED: A. Arterial plaque, peroneal, from left leg  CLINICAL HISTORY: None provided  PRE-OPERATIVE DIAGNOSIS: ASO with claudication  POST-OPERATIVE DIAGNOSIS: Same as pre-op     DIAGNOSIS: A.  ARTERIAL PLAQUE, PERONEAL ARTERY FROM LEFT LEG; ENDARTERECTOMY: - CALCIFIED ATHEROMATOUS PLAQUE.   GROSS DESCRIPTION:  A. Labeled: peroneal artery plaque from left leg  Tissue fragment(s): 2  Size: aggregate, 0.5 x 0.3 x 0.1 cm  Description: in formalin, tan to brown fragments  Entirely submitted in 1 cassette(s).          Final Diagnosis performed by Delorse Lek, MD.  Electronically signed 12/18/2017 9:54:31AM    The electronic signature indicates that the named Attending Pathologist has evaluated the specimen  Technical component performed at [facility], [address] [place], [place] [address] Lab: [phone] Dir: Rush Farmer, MD, MMM   Professional component performed at [facility], [place], [place], [place] [address] Lab: [phone] Dir: Dellia Nims. Rubinas, MD    Glucose, capillary     Status: Abnormal   Collection Time: 12/16/17  2:51 PM  Result Value Ref Range   Glucose-Capillary 143 (H) 65 - 99 mg/dL  CBC     Status: Abnormal   Collection Time: 12/16/17  3:23 PM  Result Value Ref Range   WBC 10.7 (H) 3.8 - 10.6 K/uL   RBC 3.34 (L) 4.40 - 5.90 MIL/uL   Hemoglobin 9.0 (L) 13.0 - 18.0 g/dL   HCT 27.7 (L) 40.0 - 52.0 %   MCV 82.8 80.0 - 100.0 fL   MCH 27.1 26.0 - 34.0 pg   MCHC 32.7 32.0 - 36.0 g/dL   RDW 16.7 (H) 11.5 - 14.5 %   Platelets 231 150 - 440 K/uL    Comment: Performed at [facility], [address] [place], [place] [address]  Creatinine, serum     Status: Abnormal   Collection  Time: 12/16/17  3:23 PM  Result Value Ref Range   Creatinine, Ser 1.55 (H) 0.61 - 1.24 mg/dL   GFR calc non Af Amer 37 (L) >60 mL/min   GFR calc Af Amer 43 (L) >60 mL/min    Comment: (NOTE) The eGFR has been calculated using the CKD EPI equation. This calculation has not been validated in all clinical situations. eGFR's persistently <60 mL/min signify possible Chronic Kidney Disease. Performed at [facility], [address] [place], [place] [address]   CBC     Status: Abnormal   Collection Time: 12/17/17  1:29 AM  Result Value Ref Range   WBC 10.3 3.8 - 10.6 K/uL   RBC 2.75 (L) 4.40 -  5.90 MIL/uL   Hemoglobin 7.4 (L) 13.0 - 18.0 g/dL   HCT 22.6 (L) 40.0 - 52.0 %   MCV 82.2 80.0 - 100.0 fL   MCH 26.8 26.0 - 34.0 pg   MCHC 32.7 32.0 - 36.0 g/dL   RDW 16.8 (H) 11.5 - 14.5 %   Platelets 215 150 - 440 K/uL    Comment: Performed at [facility], [address] [place], [place] [address]  Basic metabolic panel     Status: Abnormal   Collection Time: 12/17/17  1:29 AM  Result Value Ref Range   Sodium 134 (L) 135 - 145 mmol/L   Potassium 4.2 3.5 - 5.1 mmol/L   Chloride 103 101 - 111 mmol/L   CO2 25 22 - 32 mmol/L   Glucose, Bld 115 (H) 65 - 99 mg/dL   BUN 24 (H) 6 - 20 mg/dL   Creatinine, Ser 1.54 (H) 0.61 - 1.24 mg/dL   Calcium 8.6 (L) 8.9 - 10.3 mg/dL   GFR calc non Af Amer 37 (L) >60 mL/min   GFR calc Af Amer 43 (L) >60 mL/min    Comment: (NOTE) The eGFR has been calculated using the CKD EPI equation. This calculation has not been validated in all clinical situations. eGFR's persistently <60 mL/min signify possible Chronic Kidney Disease.    Anion gap 6 5 - 15    Comment: Performed at [facility], [address] [place], [place] [address]  CBC     Status: Abnormal   Collection Time: 12/18/17  4:09 AM  Result Value Ref Range   WBC 11.5 (H) 3.8 - 10.6 K/uL   RBC 2.96 (L) 4.40 - 5.90 MIL/uL   Hemoglobin 8.0 (L) 13.0 - 18.0 g/dL   HCT 24.3  (L) 40.0 - 52.0 %   MCV 82.1 80.0 - 100.0 fL   MCH 27.1 26.0 - 34.0 pg   MCHC 33.0 32.0 - 36.0 g/dL   RDW 16.9 (H) 11.5 - 14.5 %   Platelets 225 150 - 440 K/uL    Comment: Performed at [facility], [address] [place], [place] [address]  Basic metabolic panel     Status: Abnormal   Collection Time: 12/18/17  4:09 AM  Result Value Ref Range   Sodium 137 135 - 145 mmol/L   Potassium 4.4 3.5 - 5.1 mmol/L   Chloride 101 101 - 111 mmol/L   CO2 31 22 - 32 mmol/L   Glucose, Bld 105 (H) 65 - 99 mg/dL   BUN 29 (H) 6 - 20 mg/dL   Creatinine, Ser 1.72 (H) 0.61 - 1.24 mg/dL   Calcium 9.1 8.9 - 10.3 mg/dL   GFR calc non Af Amer 33 (L) >60 mL/min   GFR calc Af Amer 38 (L) >60 mL/min    Comment: (NOTE) The eGFR has been calculated using the CKD EPI equation. This calculation has not been validated in all clinical situations. eGFR's persistently <60 mL/min signify possible Chronic Kidney Disease.    Anion gap 5 5 - 15    Comment: Performed at [facility], [address] [place], [place] [address]  Magnesium     Status: None   Collection Time: 12/18/17  4:09 AM  Result Value Ref Range   Magnesium 2.0 1.7 - 2.4 mg/dL    Comment: Performed at [facility], [address] [place], [place] [address]  CBC     Status: Abnormal   Collection Time: 12/19/17  4:54 AM  Result Value Ref Range   WBC 11.3 (H) 3.8 - 10.6 K/uL   RBC 3.05 (L) 4.40 - 5.90 MIL/uL   Hemoglobin 8.2 (L) 13.0 - 18.0 g/dL   HCT 25.1 (L) 40.0 - 52.0 %   MCV 82.3 80.0 - 100.0 fL   MCH 26.9 26.0 - 34.0 pg   MCHC 32.7 32.0 - 36.0 g/dL   RDW 16.9 (H) 11.5 - 14.5 %   Platelets 219 150 - 440 K/uL    Comment: Performed at [facility], [address] [place], [place] [address]  Basic metabolic panel     Status: Abnormal   Collection Time: 12/19/17  4:54 AM  Result Value Ref Range   Sodium 137 135 - 145 mmol/L   Potassium 3.9 3.5 - 5.1 mmol/L   Chloride 100 (L) 101 - 111 mmol/L    CO2 27 22 - 32 mmol/L   Glucose, Bld 101 (H) 65 - 99 mg/dL   BUN 37 (H) 6 - 20 mg/dL   Creatinine, Ser 2.21 (H) 0.61 - 1.24 mg/dL   Calcium 8.9 8.9 - 10.3 mg/dL   GFR calc non Af Amer 24 (L) >60 mL/min   GFR calc Af Amer 28 (L) >60 mL/min    Comment: (NOTE) The eGFR has been calculated using the CKD EPI equation. This calculation has not been validated in all clinical situations. eGFR's persistently <60 mL/min signify possible Chronic Kidney Disease.    Anion gap 10 5 - 15    Comment: Performed at [facility], [address] [place], [place] [address]  Magnesium     Status: None   Collection Time: 12/19/17  4:54 AM  Result Value Ref Range   Magnesium 2.1 1.7 - 2.4 mg/dL    Comment: Performed at [facility], [address] [place], [place] [address]  CBC     Status: Abnormal   Collection Time: 12/20/17  4:47 AM  Result Value Ref Range   WBC 11.3 (H) 3.8 - 10.6 K/uL   RBC 2.98 (L) 4.40 - 5.90 MIL/uL   Hemoglobin 8.0 (L) 13.0 - 18.0 g/dL   HCT 24.9 (L) 40.0 - 52.0 %   MCV 83.3 80.0 - 100.0 fL   MCH 26.9 26.0 - 34.0 pg   MCHC 32.3 32.0 - 36.0 g/dL   RDW 17.0 (H) 11.5 - 14.5 %   Platelets 229 150 - 440 K/uL    Comment: Performed at [facility], [address] [place], [place] [address]  Basic metabolic panel     Status: Abnormal   Collection Time: 12/20/17  4:47 AM  Result Value Ref Range   Sodium 140 135 - 145 mmol/L   Potassium 4.2 3.5 - 5.1 mmol/L   Chloride 105 101 - 111 mmol/L   CO2 29 22 - 32 mmol/L   Glucose, Bld 106 (H) 65 - 99 mg/dL   BUN 47 (H) 6 - 20 mg/dL   Creatinine, Ser 2.07 (H) 0.61 - 1.24 mg/dL   Calcium 8.8 (L) 8.9 - 10.3 mg/dL   GFR calc non Af Amer 26 (L) >60 mL/min   GFR calc Af Amer 30 (L) >60 mL/min    Comment: (NOTE) The eGFR has been calculated using the CKD EPI equation. This calculation has not been validated in all clinical situations. eGFR's persistently <60 mL/min signify possible Chronic Kidney Disease.      Anion gap 6 5 - 15    Comment: Performed at [facility], [address] [place], [place] [address]  Magnesium     Status: None   Collection Time: 12/20/17  4:47 AM  Result Value Ref Range   Magnesium 2.3 1.7 - 2.4 mg/dL    Comment: Performed at [facility], [address] [place], [place] [address]  CBC     Status: Abnormal   Collection Time: 12/21/17  5:50 AM  Result Value Ref Range   WBC 11.2 (H) 3.8 - 10.6 K/uL   RBC 2.70 (L) 4.40 - 5.90 MIL/uL   Hemoglobin 7.5 (L) 13.0 - 18.0 g/dL   HCT 22.6 (L) 40.0 - 52.0 %   MCV 83.7 80.0 - 100.0 fL   MCH 27.9 26.0 - 34.0 pg   MCHC 33.4 32.0 - 36.0 g/dL   RDW 17.7 (H) 11.5 - 14.5 %   Platelets 219 150 - 440 K/uL    Comment: Performed at [facility], [address] [place], [place] [address]  Basic metabolic panel     Status: Abnormal   Collection Time: 12/21/17  5:50 AM  Result Value Ref Range   Sodium 137 135 - 145 mmol/L   Potassium 4.3 3.5 - 5.1 mmol/L   Chloride 107 101 - 111 mmol/L   CO2 24 22 - 32 mmol/L   Glucose, Bld 91 65 - 99 mg/dL   BUN 42 (H) 6 - 20 mg/dL   Creatinine, Ser 1.68 (H) 0.61 - 1.24 mg/dL   Calcium 8.7 (L) 8.9 - 10.3 mg/dL   GFR calc non Af Amer 34 (L) >60 mL/min   GFR calc Af Amer 39 (L) >60 mL/min    Comment: (NOTE) The eGFR has been calculated using the CKD EPI equation. This calculation has not been validated in all clinical situations. eGFR's persistently <60 mL/min signify possible Chronic Kidney Disease.    Anion gap 6 5 - 15    Comment: Performed at [facility], [address] [place], [place] [address]  Magnesium     Status: None   Collection Time: 12/21/17  5:50 AM  Result Value Ref Range   Magnesium 2.0 1.7 - 2.4 mg/dL    Comment: Performed at [facility], [address], [place], [place] [address]    Radiology No results found.    Assessment/Plan  HTN (hypertension) blood pressure control important in reducing the progression  of atherosclerotic disease. On appropriate oral medications.   Iliac artery aneurysm (HCC) Stable and borderline size for repair  CKD (chronic kidney disease), stage III Will need to limit contrast use with upcoming procedure.    HLD (hyperlipidemia) lipid control important in reducing the progression of atherosclerotic disease. Continue statin therapy   Bilateral popliteal artery aneurysm (HCC) His duplex today shows an apparent occlusion of the distal portion of the left femoral to distal bypass graft with what appears to be compression from the very large left popliteal aneurysm. This is a very difficult and complex situation. He had massive bilateral popliteal aneurysms.  His right was repaired with endovascular techniques, but initially, the left could not be repaired with typical endovascular techniques.  He has undergone surgical bypass but this appears to be occluded and his aneurysm remains massive.  At this point, I think an angiogram would be prudent.  If appropriate, I will try to recanalize the bypass and restore patency.  We may also try to coil embolize the popliteal aneurysm to reduce the pressure and help with the compressive symptoms.  This is a very difficult situation with  a high risk of limb loss due to thrombosis of the distal runoff, compressive symptoms, high risk of DVT, and the patient's medical comorbidities.  Angiogram will be done next week.    Leotis Pain, MD  02/24/2018 2:15 PM    This note was created with Dragon medical transcription system.  Any errors from dictation are purely unintentional

## 2018-02-24 ENCOUNTER — Encounter (INDEPENDENT_AMBULATORY_CARE_PROVIDER_SITE_OTHER): Payer: Self-pay

## 2018-02-24 ENCOUNTER — Other Ambulatory Visit (INDEPENDENT_AMBULATORY_CARE_PROVIDER_SITE_OTHER): Payer: Self-pay | Admitting: Vascular Surgery

## 2018-02-24 NOTE — Assessment & Plan Note (Signed)
blood pressure control important in reducing the progression of atherosclerotic disease. On appropriate oral medications.  

## 2018-02-24 NOTE — Assessment & Plan Note (Signed)
His duplex today shows an apparent occlusion of the distal portion of the left femoral to distal bypass graft with what appears to be compression from the very large left popliteal aneurysm. This is a very difficult and complex situation. He had massive bilateral popliteal aneurysms.  His right was repaired with endovascular techniques, but initially, the left could not be repaired with typical endovascular techniques.  He has undergone surgical bypass but this appears to be occluded and his aneurysm remains massive.  At this point, I think an angiogram would be prudent.  If appropriate, I will try to recanalize the bypass and restore patency.  We may also try to coil embolize the popliteal aneurysm to reduce the pressure and help with the compressive symptoms.  This is a very difficult situation with a high risk of limb loss due to thrombosis of the distal runoff, compressive symptoms, high risk of DVT, and the patient's medical comorbidities.  Angiogram will be done next week.

## 2018-02-24 NOTE — Assessment & Plan Note (Signed)
lipid control important in reducing the progression of atherosclerotic disease. Continue statin therapy  

## 2018-02-24 NOTE — Assessment & Plan Note (Signed)
Will need to limit contrast use with upcoming procedure.

## 2018-02-24 NOTE — Assessment & Plan Note (Signed)
Stable and borderline size for repair

## 2018-02-25 DIAGNOSIS — I495 Sick sinus syndrome: Secondary | ICD-10-CM | POA: Diagnosis not present

## 2018-03-01 DIAGNOSIS — R809 Proteinuria, unspecified: Secondary | ICD-10-CM | POA: Diagnosis not present

## 2018-03-01 DIAGNOSIS — N183 Chronic kidney disease, stage 3 (moderate): Secondary | ICD-10-CM | POA: Diagnosis not present

## 2018-03-01 DIAGNOSIS — N2581 Secondary hyperparathyroidism of renal origin: Secondary | ICD-10-CM | POA: Diagnosis not present

## 2018-03-01 DIAGNOSIS — I129 Hypertensive chronic kidney disease with stage 1 through stage 4 chronic kidney disease, or unspecified chronic kidney disease: Secondary | ICD-10-CM | POA: Diagnosis not present

## 2018-03-02 ENCOUNTER — Encounter
Admission: RE | Admit: 2018-03-02 | Discharge: 2018-03-02 | Disposition: A | Discharge status: Home / Self Care | Payer: Medicare HMO | Source: Ambulatory Visit | Attending: Vascular Surgery | Admitting: Vascular Surgery

## 2018-03-02 ENCOUNTER — Other Ambulatory Visit: Payer: Self-pay

## 2018-03-02 DIAGNOSIS — M1990 Unspecified osteoarthritis, unspecified site: Secondary | ICD-10-CM | POA: Diagnosis not present

## 2018-03-02 DIAGNOSIS — I70412 Atherosclerosis of autologous vein bypass graft(s) of the extremities with intermittent claudication, left leg: Secondary | ICD-10-CM | POA: Diagnosis not present

## 2018-03-02 DIAGNOSIS — Z7902 Long term (current) use of antithrombotics/antiplatelets: Secondary | ICD-10-CM | POA: Diagnosis not present

## 2018-03-02 DIAGNOSIS — I724 Aneurysm of artery of lower extremity: Secondary | ICD-10-CM | POA: Diagnosis not present

## 2018-03-02 DIAGNOSIS — Z87891 Personal history of nicotine dependence: Secondary | ICD-10-CM | POA: Diagnosis not present

## 2018-03-02 DIAGNOSIS — E785 Hyperlipidemia, unspecified: Secondary | ICD-10-CM | POA: Diagnosis not present

## 2018-03-02 DIAGNOSIS — I130 Hypertensive heart and chronic kidney disease with heart failure and stage 1 through stage 4 chronic kidney disease, or unspecified chronic kidney disease: Secondary | ICD-10-CM | POA: Diagnosis not present

## 2018-03-02 DIAGNOSIS — I429 Cardiomyopathy, unspecified: Secondary | ICD-10-CM | POA: Diagnosis not present

## 2018-03-02 DIAGNOSIS — Z7952 Long term (current) use of systemic steroids: Secondary | ICD-10-CM | POA: Diagnosis not present

## 2018-03-02 DIAGNOSIS — Z950 Presence of cardiac pacemaker: Secondary | ICD-10-CM | POA: Diagnosis not present

## 2018-03-02 DIAGNOSIS — I509 Heart failure, unspecified: Secondary | ICD-10-CM | POA: Diagnosis not present

## 2018-03-02 DIAGNOSIS — Z7982 Long term (current) use of aspirin: Secondary | ICD-10-CM | POA: Diagnosis not present

## 2018-03-02 DIAGNOSIS — I4891 Unspecified atrial fibrillation: Secondary | ICD-10-CM | POA: Diagnosis not present

## 2018-03-02 DIAGNOSIS — I739 Peripheral vascular disease, unspecified: Secondary | ICD-10-CM | POA: Diagnosis present

## 2018-03-02 DIAGNOSIS — N183 Chronic kidney disease, stage 3 (moderate): Secondary | ICD-10-CM | POA: Diagnosis not present

## 2018-03-02 HISTORY — DX: Peripheral vascular disease, unspecified: I73.9

## 2018-03-02 LAB — CREATININE, SERUM
Creatinine, Ser: 1.56 mg/dL — ABNORMAL HIGH (ref 0.61–1.24)
GFR calc Af Amer: 43 mL/min — ABNORMAL LOW (ref >60)
GFR, EST NON AFRICAN AMERICAN: 37 mL/min — AB (ref >60)

## 2018-03-02 LAB — BUN: BUN: 22 mg/dL — ABNORMAL HIGH (ref 6–20)

## 2018-03-03 MED ORDER — CEFAZOLIN SODIUM-DEXTROSE 2-4 GM/100ML-% IV SOLN
2.0000 g | Freq: Once | INTRAVENOUS | Status: AC
  Administered 2018-03-04: 2 g via INTRAVENOUS

## 2018-03-04 ENCOUNTER — Encounter
Admission: RE | Disposition: A | Discharge status: Home / Self Care | Payer: Self-pay | Source: Ambulatory Visit | Attending: Vascular Surgery

## 2018-03-04 ENCOUNTER — Ambulatory Visit
Admission: RE | Admit: 2018-03-04 | Discharge: 2018-03-04 | Disposition: A | Discharge status: Home / Self Care | Payer: Medicare HMO | Source: Ambulatory Visit | Attending: Vascular Surgery | Admitting: Vascular Surgery

## 2018-03-04 DIAGNOSIS — M1990 Unspecified osteoarthritis, unspecified site: Secondary | ICD-10-CM | POA: Diagnosis not present

## 2018-03-04 DIAGNOSIS — I4891 Unspecified atrial fibrillation: Secondary | ICD-10-CM | POA: Insufficient documentation

## 2018-03-04 DIAGNOSIS — N183 Chronic kidney disease, stage 3 (moderate): Secondary | ICD-10-CM | POA: Diagnosis not present

## 2018-03-04 DIAGNOSIS — Z7982 Long term (current) use of aspirin: Secondary | ICD-10-CM | POA: Diagnosis not present

## 2018-03-04 DIAGNOSIS — I130 Hypertensive heart and chronic kidney disease with heart failure and stage 1 through stage 4 chronic kidney disease, or unspecified chronic kidney disease: Secondary | ICD-10-CM | POA: Insufficient documentation

## 2018-03-04 DIAGNOSIS — Z87891 Personal history of nicotine dependence: Secondary | ICD-10-CM | POA: Insufficient documentation

## 2018-03-04 DIAGNOSIS — I70412 Atherosclerosis of autologous vein bypass graft(s) of the extremities with intermittent claudication, left leg: Secondary | ICD-10-CM | POA: Insufficient documentation

## 2018-03-04 DIAGNOSIS — I70402 Unspecified atherosclerosis of autologous vein bypass graft(s) of the extremities, left leg: Secondary | ICD-10-CM | POA: Diagnosis not present

## 2018-03-04 DIAGNOSIS — I429 Cardiomyopathy, unspecified: Secondary | ICD-10-CM | POA: Insufficient documentation

## 2018-03-04 DIAGNOSIS — Z7902 Long term (current) use of antithrombotics/antiplatelets: Secondary | ICD-10-CM | POA: Insufficient documentation

## 2018-03-04 DIAGNOSIS — E785 Hyperlipidemia, unspecified: Secondary | ICD-10-CM | POA: Diagnosis not present

## 2018-03-04 DIAGNOSIS — I724 Aneurysm of artery of lower extremity: Secondary | ICD-10-CM | POA: Insufficient documentation

## 2018-03-04 DIAGNOSIS — I509 Heart failure, unspecified: Secondary | ICD-10-CM | POA: Diagnosis not present

## 2018-03-04 DIAGNOSIS — Z950 Presence of cardiac pacemaker: Secondary | ICD-10-CM | POA: Insufficient documentation

## 2018-03-04 DIAGNOSIS — Z7952 Long term (current) use of systemic steroids: Secondary | ICD-10-CM | POA: Insufficient documentation

## 2018-03-04 DIAGNOSIS — I70219 Atherosclerosis of native arteries of extremities with intermittent claudication, unspecified extremity: Secondary | ICD-10-CM

## 2018-03-04 HISTORY — PX: LOWER EXTREMITY ANGIOGRAPHY: CATH118251

## 2018-03-04 SURGERY — LOWER EXTREMITY ANGIOGRAPHY
Anesthesia: Moderate Sedation | Laterality: Left

## 2018-03-04 MED ORDER — HYDRALAZINE HCL 20 MG/ML IJ SOLN
INTRAMUSCULAR | Status: AC
  Administered 2018-03-04: 5 mg via INTRAVENOUS
  Filled 2018-03-04: qty 1

## 2018-03-04 MED ORDER — SODIUM CHLORIDE 0.9 % IV SOLN: INTRAVENOUS | Status: DC

## 2018-03-04 MED ORDER — SODIUM CHLORIDE 0.9 % IV SOLN: 250.0000 mL | INTRAVENOUS | Status: DC | PRN

## 2018-03-04 MED ORDER — LABETALOL HCL 5 MG/ML IV SOLN
INTRAVENOUS | Status: AC
  Filled 2018-03-04: qty 4

## 2018-03-04 MED ORDER — HEPARIN (PORCINE) IN NACL 2-0.9 UNIT/ML-% IJ SOLN
INTRAMUSCULAR | Status: AC
  Filled 2018-03-04: qty 1000

## 2018-03-04 MED ORDER — FENTANYL CITRATE (PF) 100 MCG/2ML IJ SOLN
INTRAMUSCULAR | Status: DC | PRN
  Administered 2018-03-04 (×5): 50 ug via INTRAVENOUS

## 2018-03-04 MED ORDER — FAMOTIDINE 20 MG PO TABS: 40.0000 mg | ORAL_TABLET | ORAL | Status: DC | PRN

## 2018-03-04 MED ORDER — LABETALOL HCL 5 MG/ML IV SOLN
10.0000 mg | INTRAVENOUS | Status: DC | PRN
  Administered 2018-03-04: 10 mg via INTRAVENOUS

## 2018-03-04 MED ORDER — MIDAZOLAM HCL 5 MG/5ML IJ SOLN
INTRAMUSCULAR | Status: AC
  Filled 2018-03-04: qty 5

## 2018-03-04 MED ORDER — HYDRALAZINE HCL 20 MG/ML IJ SOLN
5.0000 mg | INTRAMUSCULAR | Status: AC | PRN
Start: 2018-03-04 — End: 2018-03-04
  Administered 2018-03-04: 5 mg via INTRAVENOUS
  Administered 2018-03-04: 20 mg via INTRAVENOUS

## 2018-03-04 MED ORDER — SODIUM CHLORIDE 0.9% FLUSH: 3.0000 mL | Freq: Two times a day (BID) | INTRAVENOUS | Status: DC

## 2018-03-04 MED ORDER — ALTEPLASE 2 MG IJ SOLR
INTRAMUSCULAR | Status: DC | PRN
  Administered 2018-03-04: 4 mg

## 2018-03-04 MED ORDER — MIDAZOLAM HCL 2 MG/2ML IJ SOLN
INTRAMUSCULAR | Status: DC | PRN
  Administered 2018-03-04 (×3): 1 mg via INTRAVENOUS
  Administered 2018-03-04: 2 mg via INTRAVENOUS
  Administered 2018-03-04: 1 mg via INTRAVENOUS

## 2018-03-04 MED ORDER — FENTANYL CITRATE (PF) 100 MCG/2ML IJ SOLN
INTRAMUSCULAR | Status: AC
  Filled 2018-03-04: qty 2

## 2018-03-04 MED ORDER — HEPARIN SODIUM (PORCINE) 1000 UNIT/ML IJ SOLN
INTRAMUSCULAR | Status: AC
  Filled 2018-03-04: qty 1

## 2018-03-04 MED ORDER — LIDOCAINE-EPINEPHRINE (PF) 1 %-1:200000 IJ SOLN
INTRAMUSCULAR | Status: AC
  Filled 2018-03-04: qty 30

## 2018-03-04 MED ORDER — MIDAZOLAM HCL 2 MG/2ML IJ SOLN
INTRAMUSCULAR | Status: AC
  Filled 2018-03-04: qty 2

## 2018-03-04 MED ORDER — ONDANSETRON HCL 4 MG/2ML IJ SOLN: 4.0000 mg | Freq: Four times a day (QID) | INTRAMUSCULAR | Status: DC | PRN

## 2018-03-04 MED ORDER — HEPARIN SODIUM (PORCINE) 1000 UNIT/ML IJ SOLN
INTRAMUSCULAR | Status: DC | PRN
  Administered 2018-03-04: 2000 [IU] via INTRAVENOUS
  Administered 2018-03-04: 5000 [IU] via INTRAVENOUS

## 2018-03-04 MED ORDER — IOPAMIDOL (ISOVUE-300) INJECTION 61%
INTRAVENOUS | Status: DC | PRN
  Administered 2018-03-04: 85 mL via INTRA_ARTERIAL

## 2018-03-04 MED ORDER — HYDROMORPHONE HCL 1 MG/ML IJ SOLN: 1.0000 mg | Freq: Once | INTRAMUSCULAR | Status: DC | PRN

## 2018-03-04 MED ORDER — SODIUM CHLORIDE 0.9 % IV SOLN
INTRAVENOUS | Status: DC
  Administered 2018-03-04: 09:00:00 via INTRAVENOUS

## 2018-03-04 MED ORDER — SODIUM CHLORIDE 0.9% FLUSH: 3.0000 mL | INTRAVENOUS | Status: DC | PRN

## 2018-03-04 MED ORDER — METHYLPREDNISOLONE SODIUM SUCC 125 MG IJ SOLR: 125.0000 mg | INTRAMUSCULAR | Status: DC | PRN

## 2018-03-04 SURGICAL SUPPLY — 36 items
BALLN DORADO 4X200X135 (BALLOONS) ×2
BALLN LUTONIX 018 4X220X130 (BALLOONS) ×2
BALLN ULTRVRSE 5X250X130 (BALLOONS) ×2
BALLOON DORADO 4X200X135 (BALLOONS) ×1 IMPLANT
BALLOON LUTONIX 018 4X220X130 (BALLOONS) ×1 IMPLANT
BALLOON ULTRVRSE 5X250X130 (BALLOONS) ×1 IMPLANT
CANISTER PENUMBRA ENGINE (MISCELLANEOUS) ×2 IMPLANT
CATH BEACON 5 .038 100 VERT TP (CATHETERS) ×2 IMPLANT
CATH CXI SUPP ANG 4FR 135 (CATHETERS) ×1 IMPLANT
CATH CXI SUPP ANG 4FR 135CM (CATHETERS) ×2
CATH INDIGO CAT6 KIT (CATHETERS) ×2 IMPLANT
CATH INFUS 135CMX30CM (CATHETERS) ×2 IMPLANT
CATH MICROCATH PRGRT 2.8F 130 (MICROCATHETER) ×1 IMPLANT
CATH PIG 70CM (CATHETERS) ×2 IMPLANT
COIL 400 COMPLEX SOFT 16X50CM (Vascular Products) ×2 IMPLANT
COIL 400 COMPLEX SOFT 20X60CM (Vascular Products) ×2 IMPLANT
COIL 400 COMPLEX SOFT 8X60CM (Vascular Products) ×2 IMPLANT
COIL 400 COMPLEX STD 20X60CM (Vascular Products) ×2 IMPLANT
COIL 400 COMPLEX STD 24X57CM (Vascular Products) ×4 IMPLANT
COIL 400 COMPLEX STD 28X60CM (Vascular Products) ×2 IMPLANT
DEVICE PRESTO INFLATION (MISCELLANEOUS) ×2 IMPLANT
DEVICE STARCLOSE SE CLOSURE (Vascular Products) ×2 IMPLANT
GLIDEWIRE ADV .035X260CM (WIRE) ×2 IMPLANT
HANDLE DETACHMENT COIL (MISCELLANEOUS) ×2 IMPLANT
MICROCATH PROGREAT 2.8F 130CM (MICROCATHETER) ×2
PACK ANGIOGRAPHY (CUSTOM PROCEDURE TRAY) ×2 IMPLANT
SHEATH BRITE TIP 5FRX11 (SHEATH) ×2 IMPLANT
SHEATH PINNACLE ST 6F 65CM (SHEATH) ×2 IMPLANT
STENT VIABAHN 5X150X120 (Permanent Stent) ×2 IMPLANT
STENT VIABAHN 6X150X120 (Permanent Stent) ×2 IMPLANT
STENT VIABAHN 6X250X120 (Permanent Stent) ×2 IMPLANT
SYR MEDRAD MARK V 150ML (SYRINGE) ×2 IMPLANT
TUBING CONTRAST HIGH PRESS 72 (TUBING) ×2 IMPLANT
VALVE HEMO TOUHY BORST Y (ADAPTER) ×2 IMPLANT
WIRE G V18X300CM (WIRE) ×2 IMPLANT
WIRE J 3MM .035X145CM (WIRE) ×2 IMPLANT

## 2018-03-04 NOTE — Progress Notes (Signed)
Blood noted on pt's dressing after groin check again, manual pressure applied, Dr. Lucky Cowboy informed.  Plan made w/ Dr. Lucky Cowboy to hold pressure for 15 min then apply PAD.

## 2018-03-04 NOTE — Op Note (Signed)
[facility] Percutaneous Study/Intervention Procedural Note   Date of Surgery: 03/04/2018  Surgeon(s):Shaquanna Lycan   Assistants:none  Pre-operative Diagnosis: PAD and massive left popliteal aneurysm creating compressive symptoms with poor lower extremity perfusion, status post left femoral to distal bypass which has occluded.  Post-operative diagnosis: Same  Procedure(s) Performed: 1. Ultrasound guidance for vascular access right femoral artery 2. Catheter placement into left peroneal artery through the bypass from right femoral approach as well as into the left native popliteal artery from the right femoral approach 3. Aortogram and selective left lower extremity angiogram 4. Catheter directed thrombolytic therapy with 4 mg of TPA to the left femoral to peroneal bypass as well as the peroneal artery 5. Mechanical thrombectomy of the left femoral to peroneal bypass as well as the left peroneal artery with the penumbra cat 6 device  6.  Percutaneous transluminal angioplasty of the proximal peroneal artery, distal bypass anastomosis, and mid to distal portions of the bypass graft with 4 mm diameter by 22 cm length Lutonix drug-coated angioplasty balloon inflated twice 7. Viabahn covered stent placement to the distal bypass anastomosis and proximal peroneal artery with a 5 mm diameter by 15 cm length stent for high-grade residual stenosis after angioplasty  8.   Viabahn covered stent placement x2 to the proximal, mid, and distal femoral to distal bypass graft with a 6 mm diameter by 15 cm length stent and a 6 mm diameter by 25 cm length stent for multiple areas of greater than 50% residual stenosis and extravasation  9.   Coil embolization of the native left popliteal artery and distal superficial femoral artery for massive aneurysm to reduce inflow   10.  StarClose closure device right femoral  artery  EBL: 200 cc  Contrast: 85 cc  Fluoro Time: 17.5 minutes  Moderate Conscious Sedation Time: approximately 80 minutes using 6 mg of Versed and 250 Mcg of Fentanyl  Indications: Patient is a 84 y.o.male with marked left leg swelling and pain and a known massive left popliteal aneurysm.  About 3 months ago, he underwent surgical bypass which was a femoral to peroneal bypass. The patient has noninvasive study showing occlusion of the bypass and continued flow into the massive popliteal aneurysm. The patient is brought in for angiography for further evaluation and potential treatment.  Due to the limb threatening nature of the situation, angiogram was performed for attempted limb salvage.  Risks and benefits are discussed and informed consent is obtained  Procedure: The patient was identified and appropriate procedural time out was performed. The patient was then placed supine on the table and prepped and draped in the usual sterile fashion.Moderate conscious sedation was administered during a face to face encounter with the patient throughout the procedure with my supervision of the RN administering medicines and monitoring the patient's vital signs, pulse oximetry, telemetry and mental status throughout from the start of the procedure until the patient was taken to the recovery room. Ultrasound was used to evaluate the right common femoral artery. It was patent . A digital ultrasound image was acquired. A Seldinger needle was used to access the right common femoral artery under direct ultrasound guidance and a permanent image was performed. A 0.035 J wire was advanced without resistance and a 5Fr sheath was placed. Pigtail catheter was placed into the aorta and an AP aortogram was performed. This demonstrated normal renal arteries that appear to be patent.  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 I then crossed the  aortic bifurcation and advanced to the left femoral head. Selective left lower extremity angiogram was then performed. This demonstrated the proximal portion of the bypass graft was patent.  There were large venous branches in the mid thigh and the bypass graft occluded just below this.  The native SFA was calcific, diseased, and somewhat aneurysmal with sluggish flow down to the massive popliteal aneurysm.  The common femoral artery and the profunda femoris artery were widely patent.  On initial imaging, distal perfusion was really not seen in the tibial vessels. The patient was systemically heparinized and a 6 French 70 cm sheath was then placed over the Terumo Advantage wire. I then used a Kumpe catheter and the advantage wire to navigate down into the bypass and across the occlusion and the bypass confirming intraluminal flow in the peroneal artery with the CXI catheter.  I then instilled 4 mg of TPA into the peroneal artery, distal bypass anastomosis, and mid to distal portion of the graft which was occluded with a diagnostic catheter.  I then exchanged for a 0.018 wire and after this was allowed to dwell for several minutes.  I then made several passes with the penumbra cat 6 catheter to the femoral to distal bypass graft, and down into the peroneal artery.  There appeared to be a small amount of thrombus returned, but the flow remained poor.  At this point, I performed angioplasty.  The proximal portion of the peroneal artery, distal bypass anastomosis, and distal graft were treated with a 4 mm diameter by 22 cm length Lutonix drug-coated angioplasty balloon inflated to 8 atm for 1 minute.  The balloon was then pulled back to the midportion of the bypass graft in the mid to distal portions the bypass graft were treated with the 4 mm diameter by 22 cm length balloon inflated to 10 atm for 1 minute.  Angiogram following this showed multiple areas of high-grade residual stenosis within the bypass graft and 2  areas of marked extravasation.  There was tight residual stenosis at the distal bypass anastomosis leading into the peroneal artery of greater than 80%.  I then selected a 5 mm diameter by 15 cm length Viabahn stent and deployed this into the peroneal artery and back across the bypass anastomosis and into the distal portion of the bypass graft.  This was postdilated with a 4 mm balloon including a 4 mm diameter high-pressure angioplasty balloon to break the waist which required 30 atm at the distal bypass anastomosis.  For the residual stenosis in the 2 areas of extravasation within the bypass graft itself a 6 mm diameter by 25 cm length Viabahn stent was deployed down to the 5 mm stent.  A second 6 mm diameter by 15 cm length stent was deployed up to the more proximal portion of the graft to exclude all the larger venous branches and treat greater than 50% residual stenosis.  The most proximal stent was above the areas of previous extravasation.  The stents within the bypass graft were postdilated with 5 mm balloons with less than 20% residual stenosis and improved flow distally at this point.  At this point, I pulled the sheath of the diagnostic catheter back to the common femoral artery.  I then cannulated the superficial femoral artery with a Kumpe catheter and the V 18 wire and advanced the sheath over the Kumpe catheter and the wire down into the mid native left superficial femoral artery.  I then advanced a prograde microcatheter into the proximal popliteal artery.  The massive popliteal aneurysm was easily visualized at this point.  I felt to cut off inflow to this aneurysm and reduce compressive symptoms coil embolization would be helpful.  A series of Ruby coils were used with the first being a 24 coil in the next 2 being 28 coils.  I then used 3 soft coils with the first 2 being soft 28 coils and the last being a smaller 8 mm coil up into a more narrowed area in the distal SFA.  These were deployed in the  proximal popliteal artery and distal SFA at the beginning of the massively aneurysmal popliteal artery.  Successful coil embolization was seen with injection through the sheath and at this point I felt we had done all we can do to improve his perfusion through the bypass and reduced perfusion into the massive aneurysm. I elected to terminate the procedure. The sheath was removed and StarClose closure device was deployed in the right femoral artery with excellent hemostatic result. The patient was taken to the recovery room in stable condition having tolerated the procedure well.  Findings:  Aortogram:Renal arteries appear to be patent.  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 Left lower Extremity: This demonstrated the proximal portion of the bypass graft was patent.  There were large venous branches in the mid thigh and the bypass graft occluded just below this.  The native SFA was calcific, diseased, and somewhat aneurysmal with sluggish flow down to the massive popliteal aneurysm.  The common femoral artery and the profunda femoris artery were widely patent.  On initial imaging, distal perfusion was really not seen in the tibial vessels   Disposition: Patient was taken to the recovery room in stable condition having tolerated the procedure well.  Complications: None  Leotis Pain 03/04/2018 1:29 PM   This note was created with Dragon Medical transcription system. Any errors in dictation are purely unintentional.

## 2018-03-04 NOTE — Discharge Instructions (Signed)
Angiogram, Care After °This sheet gives you information about how to care for yourself after your procedure. Your health care provider may also give you more specific instructions. If you have problems or questions, contact your health care provider. °What can I expect after the procedure? °After the procedure, it is common to have bruising and tenderness at the catheter insertion area. °Follow these instructions at home: °Insertion site care °· Follow instructions from your health care provider about how to take care of your insertion site. Make sure you: °? Wash your hands with soap and water before you change your bandage (dressing). If soap and water are not available, use hand sanitizer. °? Change your dressing as told by your health care provider. °? Leave stitches (sutures), skin glue, or adhesive strips in place. These skin closures may need to stay in place for 2 weeks or longer. If adhesive strip edges start to loosen and curl up, you may trim the loose edges. Do not remove adhesive strips completely unless your health care provider tells you to do that. °· Do not take baths, swim, or use a hot tub until your health care provider approves. °· You may shower 24-48 hours after the procedure or as told by your health care provider. °? Gently wash the site with plain soap and water. °? Pat the area dry with a clean towel. °? Do not rub the site. This may cause bleeding. °· Do not apply powder or lotion to the site. Keep the site clean and dry. °· Check your insertion site every day for signs of infection. Check for: °? Redness, swelling, or pain. °? Fluid or blood. °? Warmth. °? Pus or a bad smell. °Activity °· Rest as told by your health care provider, usually for 1-2 days. °· Do not lift anything that is heavier than 10 lbs. (4.5 kg) or as told by your health care provider. °· Do not drive for 24 hours if you were given a medicine to help you relax (sedative). °· Do not drive or use heavy machinery while  taking prescription pain medicine. °General instructions °· Return to your normal activities as told by your health care provider, usually in about a week. Ask your health care provider what activities are safe for you. °· If the catheter site starts bleeding, lie flat and put pressure on the site. If the bleeding does not stop, get help right away. This is a medical emergency. °· Drink enough fluid to keep your urine clear or pale yellow. This helps flush the contrast dye from your body. °· Take over-the-counter and prescription medicines only as told by your health care provider. °· Keep all follow-up visits as told by your health care provider. This is important. °Contact a health care provider if: °· You have a fever or chills. °· You have redness, swelling, or pain around your insertion site. °· You have fluid or blood coming from your insertion site. °· The insertion site feels warm to the touch. °· You have pus or a bad smell coming from your insertion site. °· You have bruising around the insertion site. °· You notice blood collecting in the tissue around the catheter site (hematoma). The hematoma may be painful to the touch. °Get help right away if: °· You have severe pain at the catheter insertion area. °· The catheter insertion area swells very fast. °· The catheter insertion area is bleeding, and the bleeding does not stop when you hold steady pressure on the area. °·   The area near or just beyond the catheter insertion site becomes pale, cool, tingly, or numb. These symptoms may represent a serious problem that is an emergency. Do not wait to see if the symptoms will go away. Get medical help right away. Call your local emergency services (911 in the [place] Do not drive yourself to the hospital. Summary  After the procedure, it is common to have bruising and tenderness at the catheter insertion area.  After the procedure, it is important to rest and drink plenty of fluids.  Do not take baths,  swim, or use a hot tub until your health care provider says it is okay to do so. You may shower 24-48 hours after the procedure or as told by your health care provider.  If the catheter site starts bleeding, lie flat and put pressure on the site. If the bleeding does not stop, get help right away. This is a medical emergency. This information is not intended to replace advice given to you by your health care provider. Make sure you discuss any questions you have with your health care provider. Document Released: 06/19/2005 Document Revised: 11/05/2016 Document Reviewed: 11/05/2016 Elsevier Interactive Patient Education  2018 [place]. Moderate Conscious Sedation, Adult, Care After These instructions provide you with information about caring for yourself after your procedure. Your health care provider may also give you more specific instructions. Your treatment has been planned according to current medical practices, but problems sometimes occur. Call your health care provider if you have any problems or questions after your procedure. What can I expect after the procedure? After your procedure, it is common:  To feel sleepy for several hours.  To feel clumsy and have poor balance for several hours.  To have poor judgment for several hours.  To vomit if you eat too soon.  Follow these instructions at home: For at least 24 hours after the procedure:   Do not: ? Participate in activities where you could fall or become injured. ? Drive. ? Use heavy machinery. ? Drink alcohol. ? Take sleeping pills or medicines that cause drowsiness. ? Make important decisions or sign legal documents. ? Take care of children on your own.  Rest. Eating and drinking  Follow the diet recommended by your health care provider.  If you vomit: ? Drink water, juice, or soup when you can drink without vomiting. ? Make sure you have little or no nausea before eating solid foods. General  instructions  Have a responsible adult stay with you until you are awake and alert.  Take over-the-counter and prescription medicines only as told by your health care provider.  If you smoke, do not smoke without supervision.  Keep all follow-up visits as told by your health care provider. This is important. Contact a health care provider if:  You keep feeling nauseous or you keep vomiting.  You feel light-headed.  You develop a rash.  You have a fever. Get help right away if:  You have trouble breathing. This information is not intended to replace advice given to you by your health care provider. Make sure you discuss any questions you have with your health care provider. Document Released: 09/21/2013 Document Revised: 05/05/2016 Document Reviewed: 03/22/2016 Elsevier Interactive Patient Education  2018 [place]. Femoral Site Care Refer to this sheet in the next few weeks. These instructions provide you with information about caring for yourself after your procedure. Your health care provider may also give you more specific instructions. Your treatment has been  planned according to current medical practices, but problems sometimes occur. Call your health care provider if you have any problems or questions after your procedure. What can I expect after the procedure? After your procedure, it is typical to have the following:  Bruising at the site that usually fades within 1-2 weeks.  Blood collecting in the tissue (hematoma) that may be painful to the touch. It should usually decrease in size and tenderness within 1-2 weeks.  Follow these instructions at home:  Take medicines only as directed by your health care provider.  You may shower 24-48 hours after the procedure or as directed by your health care provider. Remove the bandage (dressing) and gently wash the site with plain soap and water. Pat the area dry with a clean towel. Do not rub the site, because this may cause  bleeding.  Do not take baths, swim, or use a hot tub until your health care provider approves.  Check your insertion site every day for redness, swelling, or drainage.  Do not apply powder or lotion to the site.  Limit use of stairs to twice a day for the first 2-3 days or as directed by your health care provider.  Do not squat for the first 2-3 days or as directed by your health care provider.  Do not lift over 10 lb (4.5 kg) for 5 days after your procedure or as directed by your health care provider.  Ask your health care provider when it is okay to: ? Return to work or school. ? Resume usual physical activities or sports. ? Resume sexual activity.  Do not drive home if you are discharged the same day as the procedure. Have someone else drive you.  You may drive 24 hours after the procedure unless otherwise instructed by your health care provider.  Do not operate machinery or power tools for 24 hours after the procedure or as directed by your health care provider.  If your procedure was done as an outpatient procedure, which means that you went home the same day as your procedure, a responsible adult should be with you for the first 24 hours after you arrive home.  Keep all follow-up visits as directed by your health care provider. This is important. Contact a health care provider if:  You have a fever.  You have chills.  You have increased bleeding from the site. Hold pressure on the site. Get help right away if:  You have unusual pain at the site.  You have redness, warmth, or swelling at the site.  You have drainage (other than a small amount of blood on the dressing) from the site.  The site is bleeding, and the bleeding does not stop after 30 minutes of holding steady pressure on the site.  Your leg or foot becomes pale, cool, tingly, or numb. This information is not intended to replace advice given to you by your health care provider. Make sure you discuss any  questions you have with your health care provider. Document Released: 08/04/2014 Document Revised: 05/08/2016 Document Reviewed: 06/20/2014 Elsevier Interactive Patient Education  [employer].

## 2018-03-04 NOTE — H&P (Signed)
[facility] History & Physical Update  The patient was interviewed and re-examined.  The patient's previous History and Physical has been reviewed and is unchanged.  There is no change in the plan of care. We plan to proceed with the scheduled procedure.  Leotis Pain, MD  03/04/2018, 8:20 AM

## 2018-03-04 NOTE — Progress Notes (Signed)
During groin check, pt's dressing noted to be bloody, small amount of blood noted to be trickling down pt's scrotum.  Manual pressure immediately applied for 10 minutes.  Pt's BP noted to be elevated to 189 SBP, hydralazine given per PRN order.  New dressing applied.  Pt denied any pain at this time.  Dr. Lucky Cowboy notified.  Pt informed of plan to lay flat additional half hour before attempting to sit up.

## 2018-03-05 ENCOUNTER — Encounter: Payer: Self-pay | Admitting: Vascular Surgery

## 2018-03-23 ENCOUNTER — Ambulatory Visit (INDEPENDENT_AMBULATORY_CARE_PROVIDER_SITE_OTHER): Payer: Medicare HMO | Admitting: Vascular Surgery

## 2018-03-23 ENCOUNTER — Encounter (INDEPENDENT_AMBULATORY_CARE_PROVIDER_SITE_OTHER): Payer: Self-pay | Admitting: Vascular Surgery

## 2018-03-23 ENCOUNTER — Ambulatory Visit (INDEPENDENT_AMBULATORY_CARE_PROVIDER_SITE_OTHER): Payer: Medicare HMO

## 2018-03-23 VITALS — BP 201/99 | HR 110 | Resp 18 | Ht 70.0 in | Wt 173.0 lb

## 2018-03-23 DIAGNOSIS — I723 Aneurysm of iliac artery: Secondary | ICD-10-CM

## 2018-03-23 DIAGNOSIS — N183 Chronic kidney disease, stage 3 unspecified: Secondary | ICD-10-CM

## 2018-03-23 DIAGNOSIS — E785 Hyperlipidemia, unspecified: Secondary | ICD-10-CM | POA: Diagnosis not present

## 2018-03-23 DIAGNOSIS — I724 Aneurysm of artery of lower extremity: Secondary | ICD-10-CM

## 2018-03-23 DIAGNOSIS — I729 Aneurysm of unspecified site: Secondary | ICD-10-CM

## 2018-03-23 NOTE — Progress Notes (Signed)
MRN : [id number]  Devin Washington is a 84 y.o. (1923/01/18) male who presents with chief complaint of  Chief Complaint  Patient presents with  . Follow-up    6 month aortic iliac  .  History of Present Illness: Patient returns today in follow up.  This was a regular 6 month follow-up for his iliac aneurysm.  This is stable today on duplex at 2.7 cm on the right.  He is mildly aneurysmal in his distal aorta at 3.2 cm as well as his left common iliac at 1.6 cm.  These are all stable. He is still having a fair bit of left lower extremity swelling.  He has some bruising at hardness after an extensive intervention to salvage his left femoral to distal bypass requiring stents in the bypass.  He is not having a lot of pain but he is a pretty stoic fellow.  His swelling remains quite prominent.  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  Current Outpatient Medications  Medication Sig Dispense Refill  . albuterol (PROVENTIL HFA;VENTOLIN HFA) 108 (90 Base) MCG/ACT inhaler Inhale 2 puffs into the lungs every 6 (six) hours as needed for wheezing or shortness of breath. 1 Inhaler 2  . ANORO ELLIPTA 62.5-25 MCG/INH AEPB Inhale 1 puff into the lungs 2 (two) times daily.    Marland Kitchen aspirin EC 81 MG EC tablet Take 1 tablet (81 mg total) by mouth daily. 30 tablet 3  . benazepril (LOTENSIN) 40 MG tablet Take 40 mg by mouth daily.    . carvedilol (COREG) 3.125 MG tablet Take 1 tablet by mouth 2 (two) times daily.    . clopidogrel (PLAVIX) 75 MG tablet Take 1 tablet (75 mg total) by mouth daily. 30 tablet 3  . docusate sodium (COLACE) 100 MG capsule Take 100 mg by mouth every morning.    . furosemide (LASIX) 20 MG tablet Take 1 tablet by mouth daily.    . pantoprazole (PROTONIX) 40 MG tablet Take 40 mg by mouth daily.    . predniSONE (DELTASONE) 20 MG tablet Take 20 mg by mouth daily with breakfast.    . simvastatin (ZOCOR) 20 MG tablet Take 40 mg by mouth daily.     .  traMADol (ULTRAM) 50 MG tablet Take 1 tablet (50 mg total) by mouth every 6 (six) hours as needed for moderate pain or severe pain. 20 tablet 1  . traZODone (DESYREL) 50 MG tablet Take 1 tablet by mouth at bedtime as needed for sleep.     Marland Kitchen triamterene-hydrochlorothiazide (DYAZIDE) 37.5-25 MG capsule Take 1 capsule by mouth daily.     No current facility-administered medications for this visit.     Past Medical History:  Diagnosis Date  . A-fib ([place])   . Cardiomyopathy ([place])   . CHF (congestive heart failure) ([place])   . DJD (degenerative joint disease)   . Duodenal ulcer 08/09/2015  . Dysrhythmia   . Erosive esophagitis   . HH (hiatus hernia)   . HLD (hyperlipidemia)   . Hypertension   . PAD (peripheral artery disease) ([place])   . Peripheral vascular disease ([place])   . Presence of permanent cardiac pacemaker   . Prostate cancer [facility])    Prostatectomy.  . 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 Shortness of breath   . SSS (sick sinus syndrome) [facility])     Past Surgical History:  Procedure Laterality Date  . CATARACT EXTRACTION, BILATERAL    . ESOPHAGOGASTRODUODENOSCOPY N/A 08/09/2015  Procedure: ESOPHAGOGASTRODUODENOSCOPY (EGD) looking in the stomach with a lighted tube to evaluate and treat;  Surgeon: Lollie Sails, MD;  Location: [facility] ENDOSCOPY;  Service: Endoscopy;  Laterality: N/A;  Procedure to be done tomorrow afternoon, 08/08/2015. Awaiting cardiology consult  . EYE SURGERY    . FEMORAL-POPLITEAL BYPASS GRAFT Left 12/16/2017   Procedure: BYPASS GRAFT FEMORAL-POPLITEAL ARTERY ( OPEN POPLITEAL BYPASS );  Surgeon: Algernon Huxley, MD;  Location: [facility] ORS;  Service: Vascular;  Laterality: Left;  . FLEXIBLE SIGMOIDOSCOPY N/A 08/09/2015   Procedure: FLEXIBLE SIGMOIDOSCOPY looking up the rectum into the distal colon with a lighted tube to examine and treat;  Surgeon: Lollie Sails, MD;  Location: [facility] ENDOSCOPY;  Service: Endoscopy;  Laterality: N/A;  Procedure to be done tomorrow afternoon,  08/08/2015. Awaiting cardiology consult.  . INSERT / REPLACE / REMOVE PACEMAKER    . JOINT REPLACEMENT Left    left knee replacement  . LOWER EXTREMITY ANGIOGRAPHY Left 11/16/2017   Procedure: LOWER EXTREMITY ANGIOGRAPHY;  Surgeon: Algernon Huxley, MD;  Location: [place] CV LAB;  Service: Cardiovascular;  Laterality: Left;  . LOWER EXTREMITY ANGIOGRAPHY Right 11/30/2017   Procedure: LOWER EXTREMITY ANGIOGRAPHY;  Surgeon: Algernon Huxley, MD;  Location: [place] CV LAB;  Service: Cardiovascular;  Laterality: Right;  . LOWER EXTREMITY ANGIOGRAPHY Left 03/04/2018   Procedure: LOWER EXTREMITY ANGIOGRAPHY;  Surgeon: Algernon Huxley, MD;  Location: [place] CV LAB;  Service: Cardiovascular;  Laterality: Left;  . PACEMAKER INSERTION    . PROSTATE SURGERY    . PROSTATE SURGERY      Social History Social History        Tobacco Use  . Smoking status: Former Research scientist (life sciences)  . Smokeless tobacco: Former Network engineer Use Topics  . Alcohol use: No    Alcohol/week: 0.0 oz  . Drug use: No     Family History No bleeding disorders, clotting disorders, autoimmune diseases, or porphyria  No Known Allergies   REVIEW OF SYSTEMS (Negative unless checked)  Constitutional: _0 Weight loss  _1 Fever  _2 Chills Cardiac: _3 Chest pain   _4 Chest pressure   _5 Palpitations   _6 Shortness of breath when laying flat   _7 Shortness of breath at rest   _8 Shortness of breath with exertion. Vascular:  _9 Pain in legs with walking   _10 Pain in legs at rest   _11 Pain in legs when laying flat   _12 Claudication   _13 Pain in feet when walking  _14 Pain in feet at rest  _15 Pain in feet when laying flat   _16 History of DVT   _17 Phlebitis   _18 Swelling in legs   _19 Varicose veins   _20 Non-healing ulcers Pulmonary:   _21 Uses home oxygen   _22 Productive cough   _23 Hemoptysis   _24 Wheeze  _25 COPD   _26 Asthma Neurologic:  _27 Dizziness  _28 Blackouts   _29 Seizures   _30 History of stroke   _31 History of TIA  _32 Aphasia   _33 Temporary blindness    _34 Dysphagia   _35 Weakness or numbness in arms   _36 Weakness or numbness in legs Musculoskeletal:  _37 Arthritis   _38 Joint swelling   _39 Joint pain   _40 Low back pain Hematologic:  _41 Easy bruising  _42 Easy bleeding   _43 Hypercoagulable state   _44 Anemic   Gastrointestinal:  _45 Blood in stool   _46 Vomiting blood  _47 Gastroesophageal reflux/heartburn   _48 Abdominal pain Genitourinary:  _49 Chronic kidney disease   _50 Difficult urination  _51 Frequent urination  _52 Burning with urination   _53 Hematuria Skin:  _54 Rashes   _55 Ulcers   _56 Wounds Psychological:  _57 History of anxiety   _58  History of  major depression.      Physical Examination  BP (!) 201/99 (BP Location: Right Arm, Patient Position: Sitting)   Pulse (!) 110   Resp 18   Ht _0  (1.778 m)   Wt 78.5 kg (173 lb)   BMI 24.82 kg/m  Gen:  WD/WN, NAD.  Appears much younger than his stated age Head: [place]/AT, No temporalis wasting. Ear/Nose/Throat: Hearing grossly intact, nares w/o erythema or drainage Eyes: Conjunctiva clear. Sclera non-icteric Neck: Supple.  Trachea midline Pulmonary:  Good air movement, no use of accessory muscles.  Cardiac: RRR, no JVD Vascular:  Vessel Right Left  Radial Palpable Palpable                          PT  1+ palpable  not palpable  DP  not palpable  not palpable    Musculoskeletal: M/S 5/5 throughout.  No deformity or atrophy.  1+ right lower extremity edema, 3+ left lower extremity edema. Neurologic: Sensation grossly intact in extremities.  Symmetrical.  Speech is fluent.  Psychiatric: Judgment intact, Mood & affect appropriate for pt's clinical situation.        Labs Recent Results (from the past 2160 hour(s))  BUN     Status: Abnormal   Collection Time: 03/02/18  9:23 AM  Result Value Ref Range   BUN 22 (H) 6 - 20 mg/dL    Comment: Performed at [facility], [address] [place], [place] [address]  Creatinine, serum     Status: Abnormal   Collection Time: 03/02/18  9:23 AM    Result Value Ref Range   Creatinine, Ser 1.56 (H) 0.61 - 1.24 mg/dL   GFR calc non Af Amer 37 (L) >60 mL/min   GFR calc Af Amer 43 (L) >60 mL/min    Comment: (NOTE) The eGFR has been calculated using the CKD EPI equation. This calculation has not been validated in all clinical situations. eGFR's persistently <60 mL/min signify possible Chronic Kidney Disease. Performed at [facility], [address] [place],  [address]     Radiology No results found.  Assessment/Plan HTN (hypertension) blood pressure control important in reducing the progression of atherosclerotic disease. On appropriate oral medications.   Iliac artery aneurysm (HCC) Stable and borderline size for repair.  Have been stable now for a couple of years.  Given his advanced age, we will just continue to follow these on 17-monthintervals.  CKD (chronic kidney disease), stage III Will need to limit contrast use with upcoming procedure.    HLD (hyperlipidemia) lipid control important in reducing the progression of atherosclerotic disease. 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  He has required multiple procedures on the left leg.  We will check his perfusion on the left leg in a month or so with ultrasound.  The good news is he is not having rest pain even if the bypass is down.  At this point, I recommend he get a compression stocking and try to elevate his leg more.    JLeotis Pain MD  03/23/2018 12:15 PM    This note was created with Dragon medical transcription system.  Any errors from dictation are purely unintentional

## 2018-03-23 NOTE — Assessment & Plan Note (Signed)
These were massive.  He has required multiple procedures on the left leg.  We will check his perfusion on the left leg in a month or so with ultrasound.  The good news is he is not having rest pain even if the bypass is down.  At this point, I recommend he get a compression stocking and try to elevate his leg more.

## 2018-03-26 DIAGNOSIS — I10 Essential (primary) hypertension: Secondary | ICD-10-CM | POA: Diagnosis not present

## 2018-04-01 ENCOUNTER — Inpatient Hospital Stay
Admission: EM | Admit: 2018-04-01 | Discharge: 2018-04-06 | DRG: 812 | Disposition: A | Discharge status: Home Health | Payer: Medicare HMO | Attending: Internal Medicine | Admitting: Internal Medicine

## 2018-04-01 ENCOUNTER — Encounter: Payer: Self-pay | Admitting: Emergency Medicine

## 2018-04-01 ENCOUNTER — Emergency Department: Payer: Medicare HMO

## 2018-04-01 DIAGNOSIS — K269 Duodenal ulcer, unspecified as acute or chronic, without hemorrhage or perforation: Secondary | ICD-10-CM | POA: Diagnosis present

## 2018-04-01 DIAGNOSIS — Z7982 Long term (current) use of aspirin: Secondary | ICD-10-CM | POA: Diagnosis not present

## 2018-04-01 DIAGNOSIS — I130 Hypertensive heart and chronic kidney disease with heart failure and stage 1 through stage 4 chronic kidney disease, or unspecified chronic kidney disease: Secondary | ICD-10-CM | POA: Diagnosis not present

## 2018-04-01 DIAGNOSIS — Z950 Presence of cardiac pacemaker: Secondary | ICD-10-CM

## 2018-04-01 DIAGNOSIS — Y92007 Garden or yard of unspecified non-institutional (private) residence as the place of occurrence of the external cause: Secondary | ICD-10-CM

## 2018-04-01 DIAGNOSIS — I5022 Chronic systolic (congestive) heart failure: Secondary | ICD-10-CM | POA: Diagnosis not present

## 2018-04-01 DIAGNOSIS — I739 Peripheral vascular disease, unspecified: Secondary | ICD-10-CM | POA: Diagnosis not present

## 2018-04-01 DIAGNOSIS — K319 Disease of stomach and duodenum, unspecified: Secondary | ICD-10-CM | POA: Diagnosis present

## 2018-04-01 DIAGNOSIS — Z8546 Personal history of malignant neoplasm of prostate: Secondary | ICD-10-CM | POA: Diagnosis not present

## 2018-04-01 DIAGNOSIS — R0602 Shortness of breath: Secondary | ICD-10-CM

## 2018-04-01 DIAGNOSIS — D649 Anemia, unspecified: Secondary | ICD-10-CM | POA: Diagnosis present

## 2018-04-01 DIAGNOSIS — E782 Mixed hyperlipidemia: Secondary | ICD-10-CM | POA: Diagnosis present

## 2018-04-01 DIAGNOSIS — Z87891 Personal history of nicotine dependence: Secondary | ICD-10-CM

## 2018-04-01 DIAGNOSIS — S0101XA Laceration without foreign body of scalp, initial encounter: Secondary | ICD-10-CM | POA: Diagnosis present

## 2018-04-01 DIAGNOSIS — D509 Iron deficiency anemia, unspecified: Secondary | ICD-10-CM | POA: Diagnosis not present

## 2018-04-01 DIAGNOSIS — I129 Hypertensive chronic kidney disease with stage 1 through stage 4 chronic kidney disease, or unspecified chronic kidney disease: Secondary | ICD-10-CM | POA: Diagnosis not present

## 2018-04-01 DIAGNOSIS — N183 Chronic kidney disease, stage 3 (moderate): Secondary | ICD-10-CM | POA: Diagnosis not present

## 2018-04-01 DIAGNOSIS — W1789XA Other fall from one level to another, initial encounter: Secondary | ICD-10-CM | POA: Diagnosis not present

## 2018-04-01 DIAGNOSIS — K648 Other hemorrhoids: Secondary | ICD-10-CM | POA: Diagnosis present

## 2018-04-01 DIAGNOSIS — R809 Proteinuria, unspecified: Secondary | ICD-10-CM | POA: Diagnosis not present

## 2018-04-01 DIAGNOSIS — I951 Orthostatic hypotension: Secondary | ICD-10-CM | POA: Diagnosis not present

## 2018-04-01 DIAGNOSIS — D631 Anemia in chronic kidney disease: Secondary | ICD-10-CM | POA: Diagnosis not present

## 2018-04-01 DIAGNOSIS — N179 Acute kidney failure, unspecified: Secondary | ICD-10-CM | POA: Diagnosis present

## 2018-04-01 DIAGNOSIS — N2581 Secondary hyperparathyroidism of renal origin: Secondary | ICD-10-CM | POA: Diagnosis not present

## 2018-04-01 DIAGNOSIS — I480 Paroxysmal atrial fibrillation: Secondary | ICD-10-CM | POA: Diagnosis not present

## 2018-04-01 DIAGNOSIS — S0990XA Unspecified injury of head, initial encounter: Secondary | ICD-10-CM | POA: Diagnosis not present

## 2018-04-01 DIAGNOSIS — D120 Benign neoplasm of cecum: Secondary | ICD-10-CM | POA: Diagnosis present

## 2018-04-01 DIAGNOSIS — W19XXXA Unspecified fall, initial encounter: Secondary | ICD-10-CM | POA: Diagnosis not present

## 2018-04-01 DIAGNOSIS — Z66 Do not resuscitate: Secondary | ICD-10-CM | POA: Diagnosis not present

## 2018-04-01 DIAGNOSIS — D62 Acute posthemorrhagic anemia: Secondary | ICD-10-CM

## 2018-04-01 DIAGNOSIS — Z7951 Long term (current) use of inhaled steroids: Secondary | ICD-10-CM

## 2018-04-01 DIAGNOSIS — Z8711 Personal history of peptic ulcer disease: Secondary | ICD-10-CM | POA: Diagnosis not present

## 2018-04-01 DIAGNOSIS — Z7902 Long term (current) use of antithrombotics/antiplatelets: Secondary | ICD-10-CM

## 2018-04-01 DIAGNOSIS — K449 Diaphragmatic hernia without obstruction or gangrene: Secondary | ICD-10-CM | POA: Diagnosis not present

## 2018-04-01 DIAGNOSIS — Z96652 Presence of left artificial knee joint: Secondary | ICD-10-CM | POA: Diagnosis present

## 2018-04-01 DIAGNOSIS — K3189 Other diseases of stomach and duodenum: Secondary | ICD-10-CM | POA: Diagnosis not present

## 2018-04-01 DIAGNOSIS — D500 Iron deficiency anemia secondary to blood loss (chronic): Secondary | ICD-10-CM | POA: Diagnosis not present

## 2018-04-01 LAB — CBC WITH DIFFERENTIAL/PLATELET
BASOS ABS: 0.1 10*3/uL (ref 0–0.1)
BASOS PCT: 1 %
Eosinophils Absolute: 0.3 10*3/uL (ref 0–0.7)
Eosinophils Relative: 2 %
HEMATOCRIT: 22.2 % — AB (ref 40.0–52.0)
Hemoglobin: 7.1 g/dL — ABNORMAL LOW (ref 13.0–18.0)
Lymphocytes Relative: 10 %
Lymphs Abs: 1.2 10*3/uL (ref 1.0–3.6)
MCH: 25.5 pg — ABNORMAL LOW (ref 26.0–34.0)
MCHC: 31.8 g/dL — ABNORMAL LOW (ref 32.0–36.0)
MCV: 80.1 fL (ref 80.0–100.0)
MONO ABS: 0.7 10*3/uL (ref 0.2–1.0)
Monocytes Relative: 6 %
NEUTROS ABS: 9.8 10*3/uL — AB (ref 1.4–6.5)
NEUTROS PCT: 81 %
Platelets: 268 10*3/uL (ref 150–440)
RBC: 2.78 MIL/uL — ABNORMAL LOW (ref 4.40–5.90)
RDW: 18.1 % — AB (ref 11.5–14.5)
WBC: 12.1 10*3/uL — ABNORMAL HIGH (ref 3.8–10.6)

## 2018-04-01 LAB — BASIC METABOLIC PANEL
ANION GAP: 7 (ref 5–15)
BUN: 27 mg/dL — ABNORMAL HIGH (ref 6–20)
CALCIUM: 8.8 mg/dL — AB (ref 8.9–10.3)
CO2: 25 mmol/L (ref 22–32)
Chloride: 104 mmol/L (ref 101–111)
Creatinine, Ser: 1.9 mg/dL — ABNORMAL HIGH (ref 0.61–1.24)
GFR, EST AFRICAN AMERICAN: 34 mL/min — AB (ref >60)
GFR, EST NON AFRICAN AMERICAN: 29 mL/min — AB (ref >60)
Glucose, Bld: 197 mg/dL — ABNORMAL HIGH (ref 65–99)
Potassium: 4.2 mmol/L (ref 3.5–5.1)
SODIUM: 136 mmol/L (ref 135–145)

## 2018-04-01 MED ORDER — SODIUM CHLORIDE 0.9 % IV SOLN
10.0000 mL/h | Freq: Once | INTRAVENOUS | Status: AC
  Administered 2018-04-02: 10 mL/h via INTRAVENOUS

## 2018-04-01 NOTE — ED Notes (Signed)
Pt unable to ambulate - became "woozy" sitting up to side of bed and had to lay back down.

## 2018-04-01 NOTE — ED Triage Notes (Signed)
Pt arrived via ems after mechanical fall that caused him to fall and hit his head on the ground. Pt's head wrapped in saturated gauze and shirt is soaked in blood on arrival. PT alert and oriented x 4. MD at bedside.

## 2018-04-01 NOTE — ED Provider Notes (Signed)
[facility] Emergency Department Provider Note    First MD Initiated Contact with Patient 04/01/18 1740     (approximate)  I have reviewed the triage vital signs and the nursing notes.   HISTORY  Chief Complaint Fall    HPI Devin Washington is a 84 y.o. male presents from home after patient was changing the shutters because no one was called and the bag is a window. Since he fell his forehead on a brick corner. Denies any LOC. He is on Plavix and aspirin was having significant bleeding from his forehead. Denies any chest pain. Does feel lightheaded. EMS was called and states that they soaked through several oropharynx gauze pads were unable to get hemostasis. Patient denies any neck pain. No abdominal pain. No other pain or discomfort at this time   Past Medical History:  Diagnosis Date  . A-fib ([place])   . Cardiomyopathy ([place])   . CHF (congestive heart failure) ([place])   . DJD (degenerative joint disease)   . Duodenal ulcer 08/09/2015  . Dysrhythmia   . Erosive esophagitis   . HH (hiatus hernia)   . HLD (hyperlipidemia)   . Hypertension   . PAD (peripheral artery disease) ([place])   . Peripheral vascular disease ([place])   . Presence of permanent cardiac pacemaker   . Prostate cancer [facility])    Prostatectomy.  . 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 Shortness of breath   . SSS (sick sinus syndrome) (HCC)    Family History  Family history unknown: Yes   Past Surgical History:  Procedure Laterality Date  . CATARACT EXTRACTION, BILATERAL    . ESOPHAGOGASTRODUODENOSCOPY N/A 08/09/2015   Procedure: ESOPHAGOGASTRODUODENOSCOPY (EGD) looking in the stomach with a lighted tube to evaluate and treat;  Surgeon: Lollie Sails, MD;  Location: [facility] ENDOSCOPY;  Service: Endoscopy;  Laterality: N/A;  Procedure to be done tomorrow afternoon, 08/08/2015. Awaiting cardiology consult  . EYE SURGERY    . FEMORAL-POPLITEAL BYPASS GRAFT Left 12/16/2017   Procedure: BYPASS GRAFT  FEMORAL-POPLITEAL ARTERY ( OPEN POPLITEAL BYPASS );  Surgeon: Algernon Huxley, MD;  Location: [facility] ORS;  Service: Vascular;  Laterality: Left;  . FLEXIBLE SIGMOIDOSCOPY N/A 08/09/2015   Procedure: FLEXIBLE SIGMOIDOSCOPY looking up the rectum into the distal colon with a lighted tube to examine and treat;  Surgeon: Lollie Sails, MD;  Location: [facility] ENDOSCOPY;  Service: Endoscopy;  Laterality: N/A;  Procedure to be done tomorrow afternoon, 08/08/2015. Awaiting cardiology consult.  . INSERT / REPLACE / REMOVE PACEMAKER    . JOINT REPLACEMENT Left    left knee replacement  . LOWER EXTREMITY ANGIOGRAPHY Left 11/16/2017   Procedure: LOWER EXTREMITY ANGIOGRAPHY;  Surgeon: Algernon Huxley, MD;  Location: [place] CV LAB;  Service: Cardiovascular;  Laterality: Left;  . LOWER EXTREMITY ANGIOGRAPHY Right 11/30/2017   Procedure: LOWER EXTREMITY ANGIOGRAPHY;  Surgeon: Algernon Huxley, MD;  Location: [place] CV LAB;  Service: Cardiovascular;  Laterality: Right;  . LOWER EXTREMITY ANGIOGRAPHY Left 03/04/2018   Procedure: LOWER EXTREMITY ANGIOGRAPHY;  Surgeon: Algernon Huxley, MD;  Location: [place] CV LAB;  Service: Cardiovascular;  Laterality: Left;  . PACEMAKER INSERTION    . PROSTATE SURGERY    . PROSTATE SURGERY     Patient Active Problem List   Diagnosis Date Noted  . Bilateral popliteal artery aneurysm ([place]) 12/16/2017  . Aneurysm ([place]) 11/13/2017  . Iliac artery aneurysm ([place]) 03/17/2017  . Protein-calorie malnutrition, severe ([place]) 08/08/2015  . Abdominal pain, epigastric  08/06/2015  . Nausea and vomiting 08/06/2015  . HTN (hypertension) 08/06/2015  . HLD (hyperlipidemia) 08/06/2015  . Back pain 08/06/2015  . CKD (chronic kidney disease), stage III ([place]) 08/06/2015  . Abdominal pain 08/06/2015      Prior to Admission medications   Medication Sig Start Date End Date Taking? Authorizing Provider  albuterol (PROVENTIL HFA;VENTOLIN HFA) 108 (90 Base) MCG/ACT inhaler Inhale 2 puffs into  the lungs every 6 (six) hours as needed for wheezing or shortness of breath. 04/23/17  Yes Lavonia Drafts, MD  ANORO ELLIPTA 62.5-25 MCG/INH AEPB Inhale 1 puff into the lungs 2 (two) times daily. 04/13/17  Yes [provider]  aspirin EC 81 MG EC tablet Take 1 tablet (81 mg total) by mouth daily. 12/22/17  Yes Dew, Erskine Squibb, MD  benazepril (LOTENSIN) 40 MG tablet Take 40 mg by mouth daily.   Yes [provider]  carvedilol (COREG) 3.125 MG tablet Take 1 tablet by mouth 2 (two) times daily. 04/23/17  Yes [provider]  clopidogrel (PLAVIX) 75 MG tablet Take 1 tablet (75 mg total) by mouth daily. 12/22/17  Yes Dew, Erskine Squibb, MD  furosemide (LASIX) 20 MG tablet Take 1 tablet by mouth daily. 04/23/17  Yes [provider]  simvastatin (ZOCOR) 20 MG tablet Take 40 mg by mouth daily.    Yes [provider]  traMADol (ULTRAM) 50 MG tablet Take 1 tablet (50 mg total) by mouth every 6 (six) hours as needed for moderate pain or severe pain. 12/21/17  Yes Dew, Erskine Squibb, MD  traZODone (DESYREL) 50 MG tablet Take 1 tablet by mouth at bedtime as needed for sleep.  04/13/17  Yes [provider]    Allergies Patient has no known allergies.    Social History Social History   Tobacco Use  . Smoking status: Former Smoker    Last attempt to quit: 03/02/1958    Years since quitting: 60.1  . Smokeless tobacco: Former Network engineer Use Topics  . Alcohol use: No    Alcohol/week: 0.0 oz  . Drug use: No    Review of Systems Patient denies headaches, rhinorrhea, blurry vision, numbness, shortness of breath, chest pain, edema, cough, abdominal pain, nausea, vomiting, diarrhea, dysuria, fevers, rashes or hallucinations unless otherwise stated above in HPI. ____________________________________________   PHYSICAL EXAM:  VITAL SIGNS: Vitals:   04/01/18 1727  BP: 116/79  Pulse: (!) 111  Resp: 18  Temp: 98.5 F (36.9 C)  SpO2: 98%    Constitutional: Alert and  oriented. Well appearing and in no acute distress. Eyes: Conjunctivae are normal.  Head:  four centimeter right forehead laceration of full-thickness without evidence of galeal involvement but does have evidence of active arterial bleed Nose: No congestion/rhinnorhea. Mouth/Throat: Mucous membranes are moist.   Neck: No stridor. Painless ROM.  Cardiovascular: Normal rate, regular rhythm. Grossly normal heart sounds.  Good peripheral circulation. Respiratory: Normal respiratory effort.  No retractions. Lungs CTAB. Gastrointestinal: Soft and nontender. No distention. No abdominal bruits. No CVA tenderness. Genitourinary:  Musculoskeletal: No lower extremity tenderness nor edema.  No joint effusions. Neurologic:  Normal speech and language. No gross focal neurologic deficits are appreciated. No facial droop Skin:  Skin is warm, dry and intact. No rash noted. Psychiatric: Mood and affect are normal. Speech and behavior are normal.  ____________________________________________   LABS (all labs ordered are listed, but only abnormal results are displayed)  Results for orders placed or performed during the hospital encounter of 04/01/18 (from  the past 24 hour(s))  CBC with Differential/Platelet     Status: Abnormal   Collection Time: 04/01/18  6:17 PM  Result Value Ref Range   WBC 12.1 (H) 3.8 - 10.6 K/uL   RBC 2.78 (L) 4.40 - 5.90 MIL/uL   Hemoglobin 7.1 (L) 13.0 - 18.0 g/dL   HCT 22.2 (L) 40.0 - 52.0 %   MCV 80.1 80.0 - 100.0 fL   MCH 25.5 (L) 26.0 - 34.0 pg   MCHC 31.8 (L) 32.0 - 36.0 g/dL   RDW 18.1 (H) 11.5 - 14.5 %   Platelets 268 150 - 440 K/uL   Neutrophils Relative % 81 %   Neutro Abs 9.8 (H) 1.4 - 6.5 K/uL   Lymphocytes Relative 10 %   Lymphs Abs 1.2 1.0 - 3.6 K/uL   Monocytes Relative 6 %   Monocytes Absolute 0.7 0.2 - 1.0 K/uL   Eosinophils Relative 2 %   Eosinophils Absolute 0.3 0 - 0.7 K/uL   Basophils Relative 1 %   Basophils Absolute 0.1 0 - 0.1 K/uL  Basic  metabolic panel     Status: Abnormal   Collection Time: 04/01/18  9:34 PM  Result Value Ref Range   Sodium 136 135 - 145 mmol/L   Potassium 4.2 3.5 - 5.1 mmol/L   Chloride 104 101 - 111 mmol/L   CO2 25 22 - 32 mmol/L   Glucose, Bld 197 (H) 65 - 99 mg/dL   BUN 27 (H) 6 - 20 mg/dL   Creatinine, Ser 1.90 (H) 0.61 - 1.24 mg/dL   Calcium 8.8 (L) 8.9 - 10.3 mg/dL   GFR calc non Af Amer 29 (L) >60 mL/min   GFR calc Af Amer 34 (L) >60 mL/min   Anion gap 7 5 - 15  Type and screen [facility]     Status: None (Preliminary result)   Collection Time: 04/01/18 11:01 PM  Result Value Ref Range   ABO/RH(D) PENDING    Antibody Screen PENDING    Sample Expiration      04/04/2018 Performed at [facility], [address] [place], [place] [address]    ____________________________________________ ____________________________________________  RADIOLOGY  I personally reviewed all radiographic images ordered to evaluate for the above acute complaints and reviewed radiology reports and findings.  These findings were personally discussed with the patient.  Please see medical record for radiology report.  ____________________________________________   PROCEDURES  Procedure(s) performed:  Marland KitchenMarland KitchenLaceration Repair Date/Time: 04/01/2018 10:47 PM Performed by: Merlyn Lot, MD Authorized by: Merlyn Lot, MD   Consent:    Consent obtained:  Verbal   Consent given by:  Patient   Risks discussed:  Infection, pain, retained foreign body, poor cosmetic result and poor wound healing Anesthesia (see MAR for exact dosages):    Anesthesia method:  Local infiltration and topical application   Local anesthetic:  Lidocaine 1% w/o epi Laceration details:    Location:  Scalp   Scalp location:  Frontal   Length (cm):  4   Depth (mm):  5 Repair type:    Repair type:  Complex Exploration:    Hemostasis achieved with:  Direct pressure and tied off vessels   Wound  exploration: entire depth of wound probed and visualized     Contaminated: no   Treatment:    Area cleansed with:  Saline and Betadine   Amount of cleaning:  Extensive   Irrigation solution:  Sterile saline   Visualized foreign bodies/material removed: no  Debridement:  None Subcutaneous repair:    Suture size:  4-0   Suture material:  Vicryl   Number of sutures:  4 Skin repair:    Repair method:  Sutures   Suture size:  5-0   Suture material:  Prolene   Number of sutures:  13 Approximation:    Approximation:  Close Post-procedure details:    Dressing:  Sterile dressing   Patient tolerance of procedure:  Tolerated well, no immediate complications  .Critical Care Performed by: Merlyn Lot, MD Authorized by: Merlyn Lot, MD   Critical care provider statement:    Critical care time (minutes):  15   Critical care time was exclusive of:  Separately billable procedures and treating other patients   Critical care was necessary to treat or prevent imminent or life-threatening deterioration of the following conditions:  Trauma   Critical care was time spent personally by me on the following activities:  Development of treatment plan with patient or surrogate, discussions with consultants, evaluation of patient's response to treatment, examination of patient, obtaining history from patient or surrogate, ordering and performing treatments and interventions, ordering and review of laboratory studies, ordering and review of radiographic studies, pulse oximetry, re-evaluation of patient's condition and review of old charts      Critical Care performed: yes ____________________________________________   INITIAL IMPRESSION / [place] / ED COURSE  Pertinent labs & imaging results that were available during my care of the patient were reviewed by me and considered in my medical decision making (see chart for details).  DDX: sdh, sah, iph, concussion, laceration,  abrasion  IZEKIEL FLEGEL is a 84 y.o. who presents to the ED with head injury as described above.  Patient did have significant blood loss secondary several gauze wraps with persistent arterial bleeding requiring hemostasis control by tying off bleeding vessel.  Wound repaired as described above CT head ordered to evaluate for intracranial abnormality shows none.  Plan is to observe patient and evaluate for lightheadedness or symptomatic anemia.  Clinical Course as of Apr 01 2345  Thu Apr 01, 2018  2128 Patient noted to be very lightheaded and dizzy after ambulation   [PR]    Clinical Course User Index [PR] Merlyn Lot, MD   Patient will require transfusion.  As part of my medical decision making, I reviewed the following data within the [place] notes reviewed and incorporated, Labs reviewed, notes from prior ED visits and [place] Controlled Substance Database   ____________________________________________   FINAL CLINICAL IMPRESSION(S) / ED DIAGNOSES  Final diagnoses:  Acute blood loss anemia  Scalp laceration, initial encounter      NEW MEDICATIONS STARTED DURING THIS VISIT:  New Prescriptions   No medications on file     Note:  This document was prepared using Dragon voice recognition software and may include unintentional dictation errors.    Merlyn Lot, MD 04/02/18 Benancio Deeds

## 2018-04-02 ENCOUNTER — Other Ambulatory Visit: Payer: Self-pay

## 2018-04-02 DIAGNOSIS — D649 Anemia, unspecified: Secondary | ICD-10-CM | POA: Diagnosis not present

## 2018-04-02 DIAGNOSIS — I480 Paroxysmal atrial fibrillation: Secondary | ICD-10-CM | POA: Diagnosis not present

## 2018-04-02 DIAGNOSIS — I5032 Chronic diastolic (congestive) heart failure: Secondary | ICD-10-CM | POA: Diagnosis not present

## 2018-04-02 DIAGNOSIS — I10 Essential (primary) hypertension: Secondary | ICD-10-CM | POA: Diagnosis not present

## 2018-04-02 DIAGNOSIS — I350 Nonrheumatic aortic (valve) stenosis: Secondary | ICD-10-CM | POA: Diagnosis not present

## 2018-04-02 DIAGNOSIS — I5021 Acute systolic (congestive) heart failure: Secondary | ICD-10-CM | POA: Diagnosis not present

## 2018-04-02 DIAGNOSIS — I951 Orthostatic hypotension: Secondary | ICD-10-CM | POA: Diagnosis not present

## 2018-04-02 DIAGNOSIS — R55 Syncope and collapse: Secondary | ICD-10-CM | POA: Diagnosis not present

## 2018-04-02 DIAGNOSIS — N183 Chronic kidney disease, stage 3 (moderate): Secondary | ICD-10-CM | POA: Diagnosis not present

## 2018-04-02 DIAGNOSIS — R000 Tachycardia, unspecified: Secondary | ICD-10-CM | POA: Diagnosis not present

## 2018-04-02 LAB — CBC
HCT: 24.8 % — ABNORMAL LOW (ref 40.0–52.0)
Hemoglobin: 8.2 g/dL — ABNORMAL LOW (ref 13.0–18.0)
MCH: 26.8 pg (ref 26.0–34.0)
MCHC: 33.1 g/dL (ref 32.0–36.0)
MCV: 81.2 fL (ref 80.0–100.0)
Platelets: 192 10*3/uL (ref 150–440)
RBC: 3.06 MIL/uL — AB (ref 4.40–5.90)
RDW: 18.3 % — AB (ref 11.5–14.5)
WBC: 10.6 10*3/uL (ref 3.8–10.6)

## 2018-04-02 LAB — TSH: TSH: 0.529 u[IU]/mL (ref 0.350–4.500)

## 2018-04-02 LAB — PREPARE RBC (CROSSMATCH)

## 2018-04-02 MED ORDER — ONDANSETRON HCL 4 MG/2ML IJ SOLN: 4.0000 mg | Freq: Four times a day (QID) | INTRAMUSCULAR | Status: DC | PRN

## 2018-04-02 MED ORDER — ASPIRIN EC 81 MG PO TBEC
81.0000 mg | DELAYED_RELEASE_TABLET | Freq: Every day | ORAL | Status: DC
  Filled 2018-04-02: qty 1

## 2018-04-02 MED ORDER — SIMVASTATIN 20 MG PO TABS
40.0000 mg | ORAL_TABLET | Freq: Every day | ORAL | Status: DC
  Administered 2018-04-02 – 2018-04-06 (×5): 40 mg via ORAL
  Filled 2018-04-02 (×5): qty 2

## 2018-04-02 MED ORDER — CLOPIDOGREL BISULFATE 75 MG PO TABS
75.0000 mg | ORAL_TABLET | Freq: Every day | ORAL | Status: DC
  Filled 2018-04-02: qty 1

## 2018-04-02 MED ORDER — UMECLIDINIUM-VILANTEROL 62.5-25 MCG/INH IN AEPB
1.0000 | INHALATION_SPRAY | Freq: Two times a day (BID) | RESPIRATORY_TRACT | Status: DC
  Administered 2018-04-02 – 2018-04-06 (×8): 1 via RESPIRATORY_TRACT
  Filled 2018-04-02 (×2): qty 14

## 2018-04-02 MED ORDER — CARVEDILOL 3.125 MG PO TABS
3.1250 mg | ORAL_TABLET | Freq: Two times a day (BID) | ORAL | Status: DC
  Administered 2018-04-02: 3.125 mg via ORAL
  Filled 2018-04-02: qty 1

## 2018-04-02 MED ORDER — TRAZODONE HCL 50 MG PO TABS: 50.0000 mg | ORAL_TABLET | Freq: Every evening | ORAL | Status: DC | PRN

## 2018-04-02 MED ORDER — ENSURE ENLIVE PO LIQD
237.0000 mL | Freq: Two times a day (BID) | ORAL | Status: DC
  Administered 2018-04-02 – 2018-04-06 (×5): 237 mL via ORAL

## 2018-04-02 MED ORDER — BENAZEPRIL HCL 20 MG PO TABS
40.0000 mg | ORAL_TABLET | Freq: Every day | ORAL | Status: DC
  Administered 2018-04-02 – 2018-04-06 (×5): 40 mg via ORAL
  Filled 2018-04-02 (×5): qty 2

## 2018-04-02 MED ORDER — FUROSEMIDE 20 MG PO TABS
20.0000 mg | ORAL_TABLET | Freq: Every day | ORAL | Status: DC
  Administered 2018-04-02 – 2018-04-06 (×5): 20 mg via ORAL
  Filled 2018-04-02 (×5): qty 1

## 2018-04-02 MED ORDER — ONDANSETRON HCL 4 MG PO TABS: 4.0000 mg | ORAL_TABLET | Freq: Four times a day (QID) | ORAL | Status: DC | PRN

## 2018-04-02 MED ORDER — ACETAMINOPHEN 325 MG PO TABS
650.0000 mg | ORAL_TABLET | Freq: Four times a day (QID) | ORAL | Status: DC | PRN
  Administered 2018-04-02: 650 mg via ORAL
  Filled 2018-04-02: qty 2

## 2018-04-02 MED ORDER — TRAMADOL HCL 50 MG PO TABS: 50.0000 mg | ORAL_TABLET | Freq: Four times a day (QID) | ORAL | Status: DC | PRN

## 2018-04-02 MED ORDER — METOPROLOL TARTRATE 25 MG PO TABS
25.0000 mg | ORAL_TABLET | Freq: Two times a day (BID) | ORAL | Status: DC
  Administered 2018-04-02 – 2018-04-06 (×9): 25 mg via ORAL
  Filled 2018-04-02 (×9): qty 1

## 2018-04-02 MED ORDER — SODIUM CHLORIDE 0.9 % IV SOLN
INTRAVENOUS | Status: DC
  Administered 2018-04-02 – 2018-04-03 (×3): via INTRAVENOUS

## 2018-04-02 MED ORDER — DOCUSATE SODIUM 100 MG PO CAPS
100.0000 mg | ORAL_CAPSULE | Freq: Two times a day (BID) | ORAL | Status: DC
  Administered 2018-04-02 – 2018-04-06 (×8): 100 mg via ORAL
  Filled 2018-04-02 (×8): qty 1

## 2018-04-02 MED ORDER — ACETAMINOPHEN 650 MG RE SUPP: 650.0000 mg | Freq: Four times a day (QID) | RECTAL | Status: DC | PRN

## 2018-04-02 NOTE — Progress Notes (Addendum)
PT CBC resulted RBC at 3.06 and Hemoglobin at 8.2. Notified doctor Gouru. Awaiting callback. Will continue to monitor.  Update (0900): Doctor Gouru called ordered to hold on Blood Transfusion and hold on aspirin and Plavix at this time. Will continue to monitor.

## 2018-04-02 NOTE — Care Management Obs Status (Signed)
[place] NOTIFICATION   Patient Details  Name: RODRICKUS MIN MRN: [id number] Date of Birth: 06-Jun-1923   Medicare Observation Status Notification Given:  No(Admitted less than 24 hours)    Beverly Sessions, RN 04/02/2018, 12:24 PM

## 2018-04-02 NOTE — Progress Notes (Signed)
Family Meeting Note  Advance Directive:yes  Today a meeting took place with the Patient.    The following clinical team members were present during this meeting:MD  The following were discussed:Patient's diagnosis: Symptomatic anemia, chronic atrial fibrillation with RVR hypertension, congestive heart failure, mechanical fall with scalp laceration, treatment plan of care discussed in detail with the patient.  He verbalized understanding of the plan Patient's progosis: Unable to determine and Goals for treatment: DNR, grand daughter Jasaun Carn [facility]  Additional follow-up to be provided: Hospitalist and cardiology  Time spent during discussion:17 MIN  Nicholes Mango, MD

## 2018-04-02 NOTE — Progress Notes (Signed)
Initial Nutrition Assessment  DOCUMENTATION CODES:   Not applicable  INTERVENTION:   Ensure Enlive po BID, each supplement provides 350 kcal and 20 grams of protein  NUTRITION DIAGNOSIS:   Inadequate oral intake related to acute illness as evidenced by per patient/family report  GOAL:   Patient will meet greater than or equal to 90% of their needs  MONITOR:   PO intake, Supplement acceptance, Labs, Weight trends, I & O's  REASON FOR ASSESSMENT:   Malnutrition Screening Tool    ASSESSMENT:   84 y.o. male with known paroxysmal nonvalvular atrial fibrillation LV systolic dysfunction with chronic systolic dysfunction heart failure with ejection fraction of 35% peripheral vascular disease with significant claudication status post recent left leg intervention essential hypertension mixed hyperlipidemia mild aortic valve stenosis status post previous pacemaker in the past who was doing fairly well and had no evidence of significant cardiovascular symptoms when he was standing on a flower pot and it fell and broke and he sustained a laceration of his forehead.     Met with pt in room today. Pt reports good appetite and oral intake pta. Pt reports that he did not have much of an appetite after he fell, but reports that he ate about 80% of his breakfast this morning. Pt does drink vanilla Ensure at home; RD will order. Per chart, pt has lost 12lbs(7%) in 3 months; this is not significant. Pt reports he used to weigh 225lbs a few years ago but lost a lot of weight when his wife died. Pt has also had multiple surgeries on his leg that he reports decreased his appetite at times. Pt with h/o duodenal ulcer, esophagitis, and prostate cancer s/p prostatectomy. RD will order supplements. Pt to discharge today.    Medications reviewed and include: aspirin, plavix, colace, lasix, NaCl '@75ml' /hr  Labs reviewed: BUN 27(H), creat 1.90(H), Ca 8.8(L) Hgb 8.2(L), Hct 24.8(L) Glucose- 197- 4/18  NUTRITION  - FOCUSED PHYSICAL EXAM:    Most Recent Value  Orbital Region  No depletion  Upper Arm Region  Moderate depletion  Thoracic and Lumbar Region  No depletion  Buccal Region  No depletion  Temple Region  Mild depletion  Clavicle Bone Region  No depletion  Clavicle and Acromion Bone Region  No depletion  Scapular Bone Region  No depletion  Dorsal Hand  No depletion  Patellar Region  No depletion  Anterior Thigh Region  No depletion  Posterior Calf Region  No depletion  Edema (RD Assessment)  None  Hair  Reviewed  Eyes  Reviewed  Mouth  Reviewed  Skin  Reviewed  Nails  Reviewed     Diet Order:  Diet Heart Room service appropriate? Yes; Fluid consistency: Thin  EDUCATION NEEDS:   Education needs have been addressed  Skin:  Skin Assessment: Reviewed RN Assessment  Last BM:  4/17  Height:   Ht Readings from Last 1 Encounters:  04/02/18 '5\' 10"'  (1.778 m)    Weight:   Wt Readings from Last 1 Encounters:  04/02/18 166 lb 9.6 oz (75.6 kg)    Ideal Body Weight:  75.4 kg  BMI:  Body mass index is 23.9 kg/m.  Estimated Nutritional Needs:   Kcal:  1900-2200kcal/day   Protein:  75-90g/day   Fluid:  >1.9L/day   Koleen Distance MS, RD, LDN Pager #[phone] After Hours Pager: [phone]

## 2018-04-02 NOTE — Consult Note (Signed)
[facility] Cardiology Consultation Note  Patient ID: Devin Washington, MRN: [id number], DOB/AGE: 1923-01-19 84 y.o. Admit date: 04/01/2018   Date of Consult: 04/02/2018 Primary Physician: Albina Billet, MD Primary Cardiologist: Call would  Chief Complaint:  Chief Complaint  Patient presents with  . Fall   Reason for Consult: Cardiovascular disease  HPI: 84 y.o. male with known paroxysmal nonvalvular atrial fibrillation LV systolic dysfunction with chronic systolic dysfunction heart failure with ejection fraction of 35% peripheral vascular disease with significant claudication status post recent left leg intervention essential hypertension mixed hyperlipidemia mild aortic valve stenosis status post previous pacemaker in the past who was doing fairly well and had no evidence of significant cardiovascular symptoms when he was standing on a flower pot and it fell and broke and he sustained a laceration of his forehead.  This was taken care of and then the patient incidentally was found to have significant anemia with a hemoglobin of 7.1.  He does have chronic kidney disease stage III but no current evidence of congestive heart failure and/or myocardial infarction or true syncope.  From the cardiac standpoint he is stable but needs further evaluation and treatment options for his significant anemia.  Currently he is ambulating well without any further significant concerns  Past Medical History:  Diagnosis Date  . A-fib ([place])   . Cardiomyopathy ([place])   . CHF (congestive heart failure) ([place])   . DJD (degenerative joint disease)   . Duodenal ulcer 08/09/2015  . Dysrhythmia   . Erosive esophagitis   . HH (hiatus hernia)   . HLD (hyperlipidemia)   . Hypertension   . PAD (peripheral artery disease) ([place])   . Peripheral vascular disease ([place])   . Presence of permanent cardiac pacemaker   . Prostate cancer [facility])    Prostatectomy.  . 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 Shortness of breath   . SSS  (sick sinus syndrome) [facility])       Surgical History:  Past Surgical History:  Procedure Laterality Date  . CATARACT EXTRACTION, BILATERAL    . ESOPHAGOGASTRODUODENOSCOPY N/A 08/09/2015   Procedure: ESOPHAGOGASTRODUODENOSCOPY (EGD) looking in the stomach with a lighted tube to evaluate and treat;  Surgeon: Lollie Sails, MD;  Location: [facility] ENDOSCOPY;  Service: Endoscopy;  Laterality: N/A;  Procedure to be done tomorrow afternoon, 08/08/2015. Awaiting cardiology consult  . EYE SURGERY    . FEMORAL-POPLITEAL BYPASS GRAFT Left 12/16/2017   Procedure: BYPASS GRAFT FEMORAL-POPLITEAL ARTERY ( OPEN POPLITEAL BYPASS );  Surgeon: Algernon Huxley, MD;  Location: [facility] ORS;  Service: Vascular;  Laterality: Left;  . FLEXIBLE SIGMOIDOSCOPY N/A 08/09/2015   Procedure: FLEXIBLE SIGMOIDOSCOPY looking up the rectum into the distal colon with a lighted tube to examine and treat;  Surgeon: Lollie Sails, MD;  Location: [facility] ENDOSCOPY;  Service: Endoscopy;  Laterality: N/A;  Procedure to be done tomorrow afternoon, 08/08/2015. Awaiting cardiology consult.  . INSERT / REPLACE / REMOVE PACEMAKER    . JOINT REPLACEMENT Left    left knee replacement  . LOWER EXTREMITY ANGIOGRAPHY Left 11/16/2017   Procedure: LOWER EXTREMITY ANGIOGRAPHY;  Surgeon: Algernon Huxley, MD;  Location: [place] CV LAB;  Service: Cardiovascular;  Laterality: Left;  . LOWER EXTREMITY ANGIOGRAPHY Right 11/30/2017   Procedure: LOWER EXTREMITY ANGIOGRAPHY;  Surgeon: Algernon Huxley, MD;  Location: [place] CV LAB;  Service: Cardiovascular;  Laterality: Right;  . LOWER EXTREMITY ANGIOGRAPHY Left 03/04/2018   Procedure: LOWER EXTREMITY ANGIOGRAPHY;  Surgeon: Algernon Huxley, MD;  Location: [place] CV LAB;  Service: Cardiovascular;  Laterality: Left;  . PACEMAKER INSERTION    . PROSTATE SURGERY    . PROSTATE SURGERY       Home Meds: Prior to Admission medications   Medication Sig Start Date End Date Taking? Authorizing Provider   albuterol (PROVENTIL HFA;VENTOLIN HFA) 108 (90 Base) MCG/ACT inhaler Inhale 2 puffs into the lungs every 6 (six) hours as needed for wheezing or shortness of breath. 04/23/17  Yes Lavonia Drafts, MD  ANORO ELLIPTA 62.5-25 MCG/INH AEPB Inhale 1 puff into the lungs 2 (two) times daily. 04/13/17  Yes [provider]  aspirin EC 81 MG EC tablet Take 1 tablet (81 mg total) by mouth daily. 12/22/17  Yes Dew, Erskine Squibb, MD  benazepril (LOTENSIN) 40 MG tablet Take 40 mg by mouth daily.   Yes [provider]  carvedilol (COREG) 3.125 MG tablet Take 1 tablet by mouth 2 (two) times daily. 04/23/17  Yes [provider]  clopidogrel (PLAVIX) 75 MG tablet Take 1 tablet (75 mg total) by mouth daily. 12/22/17  Yes Dew, Erskine Squibb, MD  furosemide (LASIX) 20 MG tablet Take 1 tablet by mouth daily. 04/23/17  Yes [provider]  simvastatin (ZOCOR) 20 MG tablet Take 40 mg by mouth daily.    Yes [provider]  traMADol (ULTRAM) 50 MG tablet Take 1 tablet (50 mg total) by mouth every 6 (six) hours as needed for moderate pain or severe pain. 12/21/17  Yes Dew, Erskine Squibb, MD  traZODone (DESYREL) 50 MG tablet Take 1 tablet by mouth at bedtime as needed for sleep.  04/13/17  Yes [provider]    Inpatient Medications:  . aspirin EC  81 mg Oral Daily  . benazepril  40 mg Oral Daily  . carvedilol  3.125 mg Oral BID  . clopidogrel  75 mg Oral Daily  . docusate sodium  100 mg Oral BID  . furosemide  20 mg Oral Daily  . simvastatin  40 mg Oral Daily  . umeclidinium-vilanterol  1 puff Inhalation BID   . sodium chloride 75 mL/hr at 04/02/18 0417    Allergies: No Known Allergies  Social History   Socioeconomic History  . Marital status: Widowed    Spouse name: Not on file  . Number of children: Not on file  . Years of education: Not on file  . Highest education level: Not on file  Occupational History  . Not on file  Social Needs  . Financial resource strain: Not on  file  . Food insecurity:    Worry: Not on file    Inability: Not on file  . Transportation needs:    Medical: Not on file    Non-medical: Not on file  Tobacco Use  . Smoking status: Former Smoker    Last attempt to quit: 03/02/1958    Years since quitting: 60.1  . Smokeless tobacco: Former Network engineer and Sexual Activity  . Alcohol use: No    Alcohol/week: 0.0 oz  . Drug use: No  . Sexual activity: Not Currently  Lifestyle  . Physical activity:    Days per week: Not on file    Minutes per session: Not on file  . Stress: Not on file  Relationships  . Social connections:    Talks on phone: Not on file    Gets together: Not on file    Attends religious service: Not on file    Active member of club or  organization: Not on file    Attends meetings of clubs or organizations: Not on file    Relationship status: Not on file  . Intimate partner violence:    Fear of current or ex partner: Not on file    Emotionally abused: Not on file    Physically abused: Not on file    Forced sexual activity: Not on file  Other Topics Concern  . Not on file  Social History Narrative  . Not on file     Family History  Family history unknown: Yes     Review of Systems Positive for fall Negative for: General:  chills, fever, night sweats or weight changes.  Cardiovascular: PND orthopnea syncope dizziness  Dermatological skin lesions rashes Respiratory: Cough congestion Urologic: Frequent urination urination at night and hematuria Abdominal: negative for nausea, vomiting, diarrhea, bright red blood per rectum, melena, or hematemesis Neurologic: negative for visual changes, and/or hearing changes  All other systems reviewed and are otherwise negative except as noted above.  Labs: No results for input(s): CKTOTAL, CKMB, TROPONINI in the last 72 hours. Lab Results  Component Value Date   WBC 10.6 04/02/2018   HGB 8.2 (L) 04/02/2018   HCT 24.8 (L) 04/02/2018   MCV 81.2 04/02/2018    PLT 192 04/02/2018    Recent Labs  Lab 04/01/18 2134  NA 136  K 4.2  CL 104  CO2 25  BUN 27*  CREATININE 1.90*  CALCIUM 8.8*  GLUCOSE 197*   No results found for: CHOL, HDL, LDLCALC, TRIG No results found for: DDIMER  Radiology/Studies:  Ct Head Wo Contrast  Result Date: 04/01/2018 CLINICAL DATA:  Mechanical fall, hitting head on ground. EXAM: CT HEAD WITHOUT CONTRAST TECHNIQUE: Contiguous axial images were obtained from the base of the skull through the vertex without intravenous contrast. COMPARISON:  10/22/2006. FINDINGS: Brain: No evidence for acute infarction, hemorrhage, mass lesion, or extra-axial fluid. Ventricular prominence, consistent with hydrocephalus ex vacuo. Hypoattenuation of white matter, likely small vessel disease. Vascular: Calcification of the cavernous internal carotid arteries consistent with cerebrovascular atherosclerotic disease. No signs of intracranial large vessel occlusion. Skull: No skull fracture. Soft tissue swelling and hematoma over the RIGHT frontal region, with laceration. Sinuses/Orbits: Mild chronic sinus disease.  Negative orbits. Other: None. Compared with priors, there has been mild progressive brain substance loss. IMPRESSION: Atrophy and small vessel disease.  No acute intracranial findings. No skull fracture or intracranial hemorrhage. RIGHT frontal scalp hematoma. Electronically Signed   By: Staci Righter M.D.   On: 04/01/2018 18:24    EKG: Normal sinus rhythm with ventricular pacing  Weights: Filed Weights   04/01/18 1726 04/02/18 0348  Weight: 173 lb (78.5 kg) 166 lb 9.6 oz (75.6 kg)     Physical Exam: Blood pressure (!) 161/86, pulse 75, temperature 98 F (36.7 C), temperature source Oral, resp. rate 18, height 5\' 10"  (1.778 m), weight 166 lb 9.6 oz (75.6 kg), SpO2 100 %. Body mass index is 23.9 kg/m. General: Well developed, well nourished, in no acute distress. Head eyes ears nose throat: Normocephalic, atraumatic, sclera  non-icteric, no xanthomas, nares are without discharge. No apparent thyromegaly and/or mass  Lungs: Normal respiratory effort.  no wheezes, no rales, no rhonchi.  Few crackles Heart: RRR with normal S1 S2.  Aortic murmur gallop, no rub, PMI is normal size and placement, carotid upstroke normal without bruit, jugular venous pressure is normal Abdomen: Soft, non-tender, non-distended with normoactive bowel sounds. No hepatomegaly. No rebound/guarding. No obvious abdominal masses.  Abdominal aorta is normal size without bruit Extremities: Trace edema. no cyanosis, no clubbing, no ulcers  Peripheral : 2+ bilateral upper extremity pulses, 2+ bilateral femoral pulses, 1 + bilateral dorsal pedal pulse Neuro: Alert and oriented. No facial asymmetry. No focal deficit. Moves all extremities spontaneously. Musculoskeletal: Normal muscle tone without kyphosis Psych:  Responds to questions appropriately with a normal affect.    Assessment: 84 year old male with paroxysmal nonvalvular atrial fibrillation currently in normal sinus rhythm with chronic kidney disease chronic systolic dysfunction heart failure essential hypertension mixed hyperlipidemia mild aortic valve stenosis status post pacemaker placement with a fall causing a laceration and incidental anemia without evidence of heart failure or myocardial infarction at this time  Plan: 1.  Continue current medical regimen for further risk reduction and congestive heart failure including ACE inhibitor and beta-blocker 2.  Continue antiplatelet medication management for peripheral vascular disease but likely would not use dual antiplatelet due to significant anemia 3.  No anticoagulation at this time for atrial fibrillation despite high risk due to concerns of risk of continued anemia and bleeding 4.  No further intervention of chronic systolic dysfunction heart failure due to stability on medications 5.  Begin ambulation and follow for improvements of symptoms  and no further cardiac diagnostics necessary today X line 6.  Okay for discharge home if patient ambulating well from the cardiac standpoint with follow-up next week  Signed, Corey Skains M.D. [facility] 04/02/2018, 9:25 AM

## 2018-04-02 NOTE — Progress Notes (Addendum)
SATURATION QUALIFICATIONS: (This note is used to comply with regulatory documentation for home oxygen)  Patient Saturations on Room Air at Rest = 98%  Patient Saturations on Room Air while Ambulating = 100%  Patient Saturations on 0Liters of oxygen while Ambulating = 100%  Please briefly explain why patient needs home oxygen: Does not require home oxygen.    Pt HR went up to 125  and BP is at 159/87. Doctor  Gouru was notified. Awaiting callback. Will continue to monitor.    Update (1546): Pt was walked at 2:15 PM oxygen was 100% at rest and during ambulation but HR went up to 126. Doctor Gouru was notified and ordered to cancel discharge 04/02/18 and ordered metropol 25 mg twice a day.

## 2018-04-02 NOTE — Progress Notes (Signed)
RN released order to transfuse unit. Blood bank made aware. This RN waited on the unit of PRBC. Blood bank notified again. Per blood bank employee, she does not see order on her system but does report that she can see that I have an order under Epic. Per blood bank employee,if I wish to have another unit of PRBC ordered, I can place order for type and screen. MD Gouru made aware. Per MD, place order for CBC. This RN to place order, will update oncoming RN.

## 2018-04-02 NOTE — Progress Notes (Signed)
[facility] [place] NAME: Devin Washington    MR#:  [id number]  DATE OF BIRTH:  08/13/1923  SUBJECTIVE:  CHIEF COMPLAINT: Patient is resting comfortably.  Denies any chest pain or shortness of breath.  Denies any dizziness.  He prefers going home.  Granddaughter takes care of him  REVIEW OF SYSTEMS:  CONSTITUTIONAL: No fever, fatigue or weakness.  EYES: No blurred or double vision.  EARS, NOSE, AND THROAT: No tinnitus or ear pain.  RESPIRATORY: No cough, shortness of breath, wheezing or hemoptysis.  CARDIOVASCULAR: No chest pain, orthopnea, edema.  GASTROINTESTINAL: No nausea, vomiting, diarrhea or abdominal pain.  GENITOURINARY: No dysuria, hematuria.  ENDOCRINE: No polyuria, nocturia,  HEMATOLOGY: No anemia, easy bruising or bleeding SKIN: No rash or lesion. MUSCULOSKELETAL: No joint pain or arthritis.   NEUROLOGIC: No tingling, numbness, weakness.  PSYCHIATRY: No anxiety or depression.   DRUG ALLERGIES:  No Known Allergies  VITALS:  Blood pressure (!) 159/87, pulse 75, temperature 97.9 F (36.6 C), temperature source Oral, resp. rate 20, height 5\' 10"  (1.778 m), weight 75.6 kg (166 lb 9.6 oz), SpO2 100 %.  PHYSICAL EXAMINATION:  GENERAL:  84 y.o.-year-old patient lying in the bed with no acute distress.  EYES: Pupils equal, round, reactive to light and accommodation. No scleral icterus. Extraocular muscles intact.  HEENT: Head atraumatic, normocephalic. Oropharynx and nasopharynx clear.  Left scalp laceration status post suturing NECK:  Supple, no jugular venous distention. No thyroid enlargement, no tenderness.  LUNGS: Normal breath sounds bilaterally, no wheezing, rales,rhonchi or crepitation. No use of accessory muscles of respiration.  CARDIOVASCULAR:   irregularly regular ,no murmurs, rubs, or gallops.  ABDOMEN: Soft, nontender, nondistended. Bowel sounds present. EXTREMITIES: No pedal edema, cyanosis, or clubbing.   NEUROLOGIC: Cranial nerves II through XII are intact. Muscle strength at his baseline in all extremities. Sensation intact. Gait not checked.  PSYCHIATRIC: The patient is alert and oriented x 3.  SKIN: No obvious rash, lesion, or ulcer.    LABORATORY PANEL:   CBC Recent Labs  Lab 04/02/18 0821  WBC 10.6  HGB 8.2*  HCT 24.8*  PLT 192   ------------------------------------------------------------------------------------------------------------------  Chemistries  Recent Labs  Lab 04/01/18 2134  NA 136  K 4.2  CL 104  CO2 25  GLUCOSE 197*  BUN 27*  CREATININE 1.90*  CALCIUM 8.8*   ------------------------------------------------------------------------------------------------------------------  Cardiac Enzymes No results for input(s): TROPONINI in the last 168 hours. ------------------------------------------------------------------------------------------------------------------  RADIOLOGY:  Ct Head Wo Contrast  Result Date: 04/01/2018 CLINICAL DATA:  Mechanical fall, hitting head on ground. EXAM: CT HEAD WITHOUT CONTRAST TECHNIQUE: Contiguous axial images were obtained from the base of the skull through the vertex without intravenous contrast. COMPARISON:  10/22/2006. FINDINGS: Brain: No evidence for acute infarction, hemorrhage, mass lesion, or extra-axial fluid. Ventricular prominence, consistent with hydrocephalus ex vacuo. Hypoattenuation of white matter, likely small vessel disease. Vascular: Calcification of the cavernous internal carotid arteries consistent with cerebrovascular atherosclerotic disease. No signs of intracranial large vessel occlusion. Skull: No skull fracture. Soft tissue swelling and hematoma over the RIGHT frontal region, with laceration. Sinuses/Orbits: Mild chronic sinus disease.  Negative orbits. Other: None. Compared with priors, there has been mild progressive brain substance loss. IMPRESSION: Atrophy and small vessel disease.  No acute  intracranial findings. No skull fracture or intracranial hemorrhage. RIGHT frontal scalp hematoma. Electronically Signed   By: Staci Righter M.D.   On: 04/01/2018 18:24    EKG:   Orders placed or  performed during the hospital encounter of 04/01/18  . EKG 12-Lead  . EKG 12-Lead  . EKG 12-Lead  . EKG 12-Lead    ASSESSMENT AND PLAN:   This is a 84 year old male admitted for symptomatic anemia.  #Atrial fibrillation with RVR with minimal exertion Monitor patient Coreg changed to metoprolol 25 mg p.o. twice daily Provide gentle hydration with IV fluids Status post 1 unit of blood transfusion  # Anemia:  The patient is received 1 unit of packed red blood cells in the emergency department.  Hemoglobin 8.2 after transfusion  #.  Hypertension: Reasonable controlled for age.  Continue benazepril.  Coreg changed to  Metoprolol  #.  CHF: Chronicsystolic with ejection fraction 35%  Continue furosemide.  #.  Fall: Appears mechanical.    Cardiology has evaluated the patient no new recommendations and agreeable with changing Coreg to metoprolol Physical therapy assessment   Patient prefers going home DVT plexus with  SCDs      All the records are reviewed and case discussed with Care Management/Social Workerr. Management plans discussed with the patient, family and they are in agreement.  CODE STATUS: dnr  TOTAL TIME TAKING CARE OF THIS PATIENT: 35  minutes.   POSSIBLE D/C IN 1 DAYS, DEPENDING ON CLINICAL CONDITION.  Note: This dictation was prepared with Dragon dictation along with smaller phrase technology. Any transcriptional errors that result from this process are unintentional.   Nicholes Mango M.D on 04/02/2018 at 3:32 PM  Between 7am to 6pm - Pager - [phone] After 6pm go to [email or web address] - password [email or web address]  [place] [facility]  Office  [phone]  CC: Primary care physician; Albina Billet, MD

## 2018-04-02 NOTE — ED Notes (Signed)
MD Diamond at bedside. 

## 2018-04-02 NOTE — H&P (Signed)
Devin Washington is an 84 y.o. male.   Chief Complaint: Fall HPI: The patient with past medical history of atrial fibrillation status post pacemaker placement; CHF, hypertension and prostate cancer status post prostatectomy presents to the emergency department after suffering a fall.  The patient states that he was trying to repair the shoulders on the outside of his home by stretching from his stoop to the window.  The patient tried to balance on a flower pot which became wobbly and caused him to fall.  He denies losing consciousness.  The patient suffered a laceration to his right forehead and now complains of headache.  Laceration was sutured by the emergency department staff who also scanned the patient's head.  No acute intracranial process was found.  The patient denies chest pain or shortness of breath.  However due to the possibility of syncope and/or arrhythmia the emergency department staff called the hospitalist service for further evaluation.  Past Medical History:  Diagnosis Date  . A-fib ([place])   . Cardiomyopathy ([place])   . CHF (congestive heart failure) ([place])   . DJD (degenerative joint disease)   . Duodenal ulcer 08/09/2015  . Dysrhythmia   . Erosive esophagitis   . HH (hiatus hernia)   . HLD (hyperlipidemia)   . Hypertension   . PAD (peripheral artery disease) ([place])   . Peripheral vascular disease ([place])   . Presence of permanent cardiac pacemaker   . Prostate cancer [facility])    Prostatectomy.  . 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 Shortness of breath   . SSS (sick sinus syndrome) [facility])     Past Surgical History:  Procedure Laterality Date  . CATARACT EXTRACTION, BILATERAL    . ESOPHAGOGASTRODUODENOSCOPY N/A 08/09/2015   Procedure: ESOPHAGOGASTRODUODENOSCOPY (EGD) looking in the stomach with a lighted tube to evaluate and treat;  Surgeon: Lollie Sails, MD;  Location[facility] ENDOSCOPY;  Service: Endoscopy;  Laterality: N/A;  Procedure to be done tomorrow afternoon, 08/08/2015.  Awaiting cardiology consult  . EYE SURGERY    . FEMORAL-POPLITEAL BYPASS GRAFT Left 12/16/2017   Procedure: BYPASS GRAFT FEMORAL-POPLITEAL ARTERY ( OPEN POPLITEAL BYPASS );  Surgeon: Algernon Huxley, MD;  Location: [facility] ORS;  Service: Vascular;  Laterality: Left;  . FLEXIBLE SIGMOIDOSCOPY N/A 08/09/2015   Procedure: FLEXIBLE SIGMOIDOSCOPY looking up the rectum into the distal colon with a lighted tube to examine and treat;  Surgeon: Lollie Sails, MD;  Location: [facility] ENDOSCOPY;  Service: Endoscopy;  Laterality: N/A;  Procedure to be done tomorrow afternoon, 08/08/2015. Awaiting cardiology consult.  . INSERT / REPLACE / REMOVE PACEMAKER    . JOINT REPLACEMENT Left    left knee replacement  . LOWER EXTREMITY ANGIOGRAPHY Left 11/16/2017   Procedure: LOWER EXTREMITY ANGIOGRAPHY;  Surgeon: Algernon Huxley, MD;  Location: [place] CV LAB;  Service: Cardiovascular;  Laterality: Left;  . LOWER EXTREMITY ANGIOGRAPHY Right 11/30/2017   Procedure: LOWER EXTREMITY ANGIOGRAPHY;  Surgeon: Algernon Huxley, MD;  Location: [place] CV LAB;  Service: Cardiovascular;  Laterality: Right;  . LOWER EXTREMITY ANGIOGRAPHY Left 03/04/2018   Procedure: LOWER EXTREMITY ANGIOGRAPHY;  Surgeon: Algernon Huxley, MD;  Location: [place] CV LAB;  Service: Cardiovascular;  Laterality: Left;  . PACEMAKER INSERTION    . PROSTATE SURGERY    . PROSTATE SURGERY      Family History  Family history unknown: Yes   Social History:  reports that he quit smoking about 60 years ago. He has quit using smokeless tobacco. He reports that  he does not drink alcohol or use drugs.  Allergies: No Known Allergies  Medications Prior to Admission  Medication Sig Dispense Refill  . albuterol (PROVENTIL HFA;VENTOLIN HFA) 108 (90 Base) MCG/ACT inhaler Inhale 2 puffs into the lungs every 6 (six) hours as needed for wheezing or shortness of breath. 1 Inhaler 2  . ANORO ELLIPTA 62.5-25 MCG/INH AEPB Inhale 1 puff into the lungs 2 (two) times  daily.    Marland Kitchen aspirin EC 81 MG EC tablet Take 1 tablet (81 mg total) by mouth daily. 30 tablet 3  . benazepril (LOTENSIN) 40 MG tablet Take 40 mg by mouth daily.    . carvedilol (COREG) 3.125 MG tablet Take 1 tablet by mouth 2 (two) times daily.    . clopidogrel (PLAVIX) 75 MG tablet Take 1 tablet (75 mg total) by mouth daily. 30 tablet 3  . furosemide (LASIX) 20 MG tablet Take 1 tablet by mouth daily.    . simvastatin (ZOCOR) 20 MG tablet Take 40 mg by mouth daily.     . traMADol (ULTRAM) 50 MG tablet Take 1 tablet (50 mg total) by mouth every 6 (six) hours as needed for moderate pain or severe pain. 20 tablet 1  . traZODone (DESYREL) 50 MG tablet Take 1 tablet by mouth at bedtime as needed for sleep.       Results for orders placed or performed during the hospital encounter of 04/01/18 (from the past 48 hour(s))  CBC with Differential/Platelet     Status: Abnormal   Collection Time: 04/01/18  6:17 PM  Result Value Ref Range   WBC 12.1 (H) 3.8 - 10.6 K/uL   RBC 2.78 (L) 4.40 - 5.90 MIL/uL   Hemoglobin 7.1 (L) 13.0 - 18.0 g/dL   HCT 22.2 (L) 40.0 - 52.0 %   MCV 80.1 80.0 - 100.0 fL   MCH 25.5 (L) 26.0 - 34.0 pg   MCHC 31.8 (L) 32.0 - 36.0 g/dL   RDW 18.1 (H) 11.5 - 14.5 %   Platelets 268 150 - 440 K/uL   Neutrophils Relative % 81 %   Neutro Abs 9.8 (H) 1.4 - 6.5 K/uL   Lymphocytes Relative 10 %   Lymphs Abs 1.2 1.0 - 3.6 K/uL   Monocytes Relative 6 %   Monocytes Absolute 0.7 0.2 - 1.0 K/uL   Eosinophils Relative 2 %   Eosinophils Absolute 0.3 0 - 0.7 K/uL   Basophils Relative 1 %   Basophils Absolute 0.1 0 - 0.1 K/uL    Comment: Performed at [facility], [address] [place], [place] [address]  Prepare RBC     Status: None   Collection Time: 04/01/18  9:31 PM  Result Value Ref Range   Order Confirmation      ORDER PROCESSED BY BLOOD BANK Performed at [facility], [address] [place], [place] [address]   Basic metabolic panel     Status: Abnormal    Collection Time: 04/01/18  9:34 PM  Result Value Ref Range   Sodium 136 135 - 145 mmol/L   Potassium 4.2 3.5 - 5.1 mmol/L   Chloride 104 101 - 111 mmol/L   CO2 25 22 - 32 mmol/L   Glucose, Bld 197 (H) 65 - 99 mg/dL   BUN 27 (H) 6 - 20 mg/dL   Creatinine, Ser 1.90 (H) 0.61 - 1.24 mg/dL   Calcium 8.8 (L) 8.9 - 10.3 mg/dL   GFR calc non Af Amer 29 (L) >60 mL/min  GFR calc Af Amer 34 (L) >60 mL/min    Comment: (NOTE) The eGFR has been calculated using the CKD EPI equation. This calculation has not been validated in all clinical situations. eGFR's persistently <60 mL/min signify possible Chronic Kidney Disease.    Anion gap 7 5 - 15    Comment: Performed at [facility], [address] [place], [place] [address]  TSH     Status: None   Collection Time: 04/01/18  9:34 PM  Result Value Ref Range   TSH 0.529 0.350 - 4.500 uIU/mL    Comment: Performed by a 3rd Generation assay with a functional sensitivity of <=0.01 uIU/mL. Performed at [facility], [address] [place], [place] [address]   Type and screen [place]     Status: None (Preliminary result)   Collection Time: 04/01/18 11:01 PM  Result Value Ref Range   ABO/RH(D) O POS    Antibody Screen NEG    Sample Expiration 04/04/2018    Unit Number [id number]    Blood Component Type RED CELLS,LR    Unit division 00    Status of Unit ISSUED    Transfusion Status OK TO TRANSFUSE    Crossmatch Result      Compatible Performed at [facility], [address] [place], [place] [address]    Ct Head Wo Contrast  Result Date: 04/01/2018 CLINICAL DATA:  Mechanical fall, hitting head on ground. EXAM: CT HEAD WITHOUT CONTRAST TECHNIQUE: Contiguous axial images were obtained from the base of the skull through the vertex without intravenous contrast. COMPARISON:  10/22/2006. FINDINGS: Brain: No evidence for acute infarction, hemorrhage, mass lesion, or extra-axial fluid.  Ventricular prominence, consistent with hydrocephalus ex vacuo. Hypoattenuation of white matter, likely small vessel disease. Vascular: Calcification of the cavernous internal carotid arteries consistent with cerebrovascular atherosclerotic disease. No signs of intracranial large vessel occlusion. Skull: No skull fracture. Soft tissue swelling and hematoma over the RIGHT frontal region, with laceration. Sinuses/Orbits: Mild chronic sinus disease.  Negative orbits. Other: None. Compared with priors, there has been mild progressive brain substance loss. IMPRESSION: Atrophy and small vessel disease.  No acute intracranial findings. No skull fracture or intracranial hemorrhage. RIGHT frontal scalp hematoma. Electronically Signed   By: Staci Righter M.D.   On: 04/01/2018 18:24    Review of Systems  Constitutional: Negative for chills and fever.  HENT: Negative for sore throat and tinnitus.   Eyes: Negative for blurred vision and redness.  Respiratory: Negative for cough and shortness of breath.   Cardiovascular: Negative for chest pain, palpitations, orthopnea and PND.  Gastrointestinal: Negative for abdominal pain, diarrhea, nausea and vomiting.  Genitourinary: Negative for dysuria, frequency and urgency.  Musculoskeletal: Negative for joint pain and myalgias.  Skin: Negative for rash.       No lesions  Neurological: Positive for dizziness (Intermittent) and headaches. Negative for speech change, focal weakness and weakness.  Endo/Heme/Allergies: Does not bruise/bleed easily.       No temperature intolerance  Psychiatric/Behavioral: Negative for depression and suicidal ideas.    Blood pressure (!) 157/81, pulse 81, temperature 99.1 F (37.3 C), temperature source Oral, resp. rate 17, height '5\' 10"'  (1.778 m), weight 75.6 kg (166 lb 9.6 oz), SpO2 100 %. Physical Exam  Vitals reviewed. Constitutional: He is oriented to person, place, and time. He appears well-developed and well-nourished. No  distress.  HENT:  Head: Normocephalic.  Mouth/Throat: Oropharynx is clear and moist.  Forehead laceration and associated hematoma status  post sutures  Eyes: Pupils are equal, round, and reactive to light. Conjunctivae and EOM are normal. No scleral icterus.  Neck: Normal range of motion. Neck supple. No JVD present. No tracheal deviation present. No thyromegaly present.  Cardiovascular: Normal rate, regular rhythm and normal heart sounds. Exam reveals no gallop and no friction rub.  No murmur heard. Respiratory: Effort normal and breath sounds normal. No respiratory distress.  GI: Soft. Bowel sounds are normal. He exhibits no distension. There is no tenderness.  Genitourinary:  Genitourinary Comments: Deferred  Musculoskeletal: Normal range of motion. He exhibits no edema.  Lymphadenopathy:    He has no cervical adenopathy.  Neurological: He is alert and oriented to person, place, and time. No cranial nerve deficit.  Skin: Skin is warm and dry. No rash noted. No erythema.  Psychiatric: He has a normal mood and affect. His behavior is normal. Judgment and thought content normal.     Assessment/Plan This is a 84 year old male admitted for symptomatic anemia. 1.  Anemia: The patient is received 1 unit of packed red blood cells in the emergency department.  We will transfuse 1 more to normalize his hemoglobin hematocrit.  History of rectal varices.  Patient denies blood in his stool.  This may have contributed to the patient's weakness and/or symptoms of dizziness at this time.  Acute blood loss from head laceration unlikely contributed to current anemia.  Also, the patient's dizziness may be secondary to concussion.  Continue to monitor. 2.  Hypertension: Reasonable controlled for age.  Continue carvedilol and benazepril. 3.  CHF: Chronic; diastolic.  Continue furosemide. 4.  Fall: Appears mechanical.  Rule out syncope or presyncope.  Consult cardiology. 5.  Atrial fibrillation: Status post  pacemaker placement.  Continue aspirin and Plavix 6.  DVT prophylaxis: SCDs 7.  GI prophylaxis: None The patient is a full code.  Time spent on admission orders and patient care approximately 45 minutes  Harrie Foreman, MD 04/02/2018, 7:05 AM

## 2018-04-02 NOTE — Plan of Care (Signed)
  Problem: Clinical Measurements: Goal: Ability to maintain clinical measurements within normal limits will improve Outcome: Progressing Goal: Will remain free from infection Outcome: Progressing Goal: Diagnostic test results will improve Outcome: Progressing   Problem: Activity: Goal: Risk for activity intolerance will decrease Outcome: Progressing   

## 2018-04-02 NOTE — Plan of Care (Signed)
?  Problem: Clinical Measurements: ?Goal: Ability to maintain clinical measurements within normal limits will improve ?Outcome: Progressing ?Goal: Will remain free from infection ?Outcome: Progressing ?Goal: Diagnostic test results will improve ?Outcome: Progressing ?  ?

## 2018-04-02 NOTE — Plan of Care (Signed)
  Problem: Clinical Measurements: Goal: Ability to maintain clinical measurements within normal limits will improve Outcome: Progressing Goal: Will remain free from infection Outcome: Progressing   Problem: Safety: Goal: Ability to remain free from injury will improve Outcome: Progressing   

## 2018-04-03 ENCOUNTER — Encounter: Payer: Self-pay | Admitting: Anesthesiology

## 2018-04-03 ENCOUNTER — Observation Stay: Payer: Medicare HMO

## 2018-04-03 DIAGNOSIS — I350 Nonrheumatic aortic (valve) stenosis: Secondary | ICD-10-CM | POA: Diagnosis not present

## 2018-04-03 DIAGNOSIS — D509 Iron deficiency anemia, unspecified: Secondary | ICD-10-CM | POA: Diagnosis not present

## 2018-04-03 DIAGNOSIS — D120 Benign neoplasm of cecum: Secondary | ICD-10-CM | POA: Diagnosis not present

## 2018-04-03 DIAGNOSIS — N183 Chronic kidney disease, stage 3 (moderate): Secondary | ICD-10-CM | POA: Diagnosis not present

## 2018-04-03 DIAGNOSIS — I951 Orthostatic hypotension: Secondary | ICD-10-CM | POA: Diagnosis not present

## 2018-04-03 DIAGNOSIS — R0602 Shortness of breath: Secondary | ICD-10-CM | POA: Diagnosis not present

## 2018-04-03 DIAGNOSIS — Z8711 Personal history of peptic ulcer disease: Secondary | ICD-10-CM

## 2018-04-03 DIAGNOSIS — K269 Duodenal ulcer, unspecified as acute or chronic, without hemorrhage or perforation: Secondary | ICD-10-CM | POA: Diagnosis not present

## 2018-04-03 DIAGNOSIS — I5032 Chronic diastolic (congestive) heart failure: Secondary | ICD-10-CM | POA: Diagnosis not present

## 2018-04-03 DIAGNOSIS — K648 Other hemorrhoids: Secondary | ICD-10-CM | POA: Diagnosis not present

## 2018-04-03 DIAGNOSIS — D649 Anemia, unspecified: Secondary | ICD-10-CM

## 2018-04-03 DIAGNOSIS — I10 Essential (primary) hypertension: Secondary | ICD-10-CM | POA: Diagnosis not present

## 2018-04-03 DIAGNOSIS — K3189 Other diseases of stomach and duodenum: Secondary | ICD-10-CM | POA: Diagnosis not present

## 2018-04-03 DIAGNOSIS — K449 Diaphragmatic hernia without obstruction or gangrene: Secondary | ICD-10-CM | POA: Diagnosis not present

## 2018-04-03 DIAGNOSIS — I480 Paroxysmal atrial fibrillation: Secondary | ICD-10-CM | POA: Diagnosis not present

## 2018-04-03 DIAGNOSIS — I5021 Acute systolic (congestive) heart failure: Secondary | ICD-10-CM | POA: Diagnosis not present

## 2018-04-03 LAB — IRON AND TIBC
IRON: 27 ug/dL — AB (ref 45–182)
SATURATION RATIOS: 12 % — AB (ref 17.9–39.5)
TIBC: 229 ug/dL — AB (ref 250–450)
UIBC: 202 ug/dL

## 2018-04-03 LAB — BPAM RBC
Blood Product Expiration Date: 201905152359
ISSUE DATE / TIME: 201904190034
Unit Type and Rh: 5100

## 2018-04-03 LAB — BASIC METABOLIC PANEL
Anion gap: 2 — ABNORMAL LOW (ref 5–15)
BUN: 24 mg/dL — AB (ref 6–20)
CO2: 29 mmol/L (ref 22–32)
CREATININE: 1.63 mg/dL — AB (ref 0.61–1.24)
Calcium: 8.7 mg/dL — ABNORMAL LOW (ref 8.9–10.3)
Chloride: 109 mmol/L (ref 101–111)
GFR, EST AFRICAN AMERICAN: 41 mL/min — AB (ref >60)
GFR, EST NON AFRICAN AMERICAN: 35 mL/min — AB (ref >60)
Glucose, Bld: 97 mg/dL (ref 65–99)
Potassium: 4.2 mmol/L (ref 3.5–5.1)
SODIUM: 140 mmol/L (ref 135–145)

## 2018-04-03 LAB — HEMOGLOBIN AND HEMATOCRIT, BLOOD
HCT: 22.7 % — ABNORMAL LOW (ref 40.0–52.0)
HCT: 24.7 % — ABNORMAL LOW (ref 40.0–52.0)
HCT: 25.4 % — ABNORMAL LOW (ref 40.0–52.0)
HEMOGLOBIN: 7.4 g/dL — AB (ref 13.0–18.0)
Hemoglobin: 8.2 g/dL — ABNORMAL LOW (ref 13.0–18.0)
Hemoglobin: 8.2 g/dL — ABNORMAL LOW (ref 13.0–18.0)

## 2018-04-03 LAB — TYPE AND SCREEN
ABO/RH(D): O POS
Antibody Screen: NEGATIVE
UNIT DIVISION: 0

## 2018-04-03 LAB — VITAMIN B12: VITAMIN B 12: 264 pg/mL (ref 180–914)

## 2018-04-03 LAB — RETICULOCYTES
RBC.: 2.82 MIL/uL — ABNORMAL LOW (ref 4.40–5.90)
Retic Count, Absolute: 56.4 10*3/uL (ref 19.0–183.0)
Retic Ct Pct: 2 % (ref 0.4–3.1)

## 2018-04-03 LAB — FOLATE: Folate: 17.5 ng/mL (ref >5.9)

## 2018-04-03 LAB — FERRITIN: Ferritin: 152 ng/mL (ref 24–336)

## 2018-04-03 MED ORDER — PEG 3350-KCL-NA BICARB-NACL 420 G PO SOLR
4000.0000 mL | Freq: Once | ORAL | Status: AC
  Administered 2018-04-03: 4000 mL via ORAL
  Filled 2018-04-03: qty 4000

## 2018-04-03 MED ORDER — SODIUM CHLORIDE 0.9 % IV SOLN
INTRAVENOUS | Status: DC
  Administered 2018-04-04: 16:00:00 via INTRAVENOUS

## 2018-04-03 MED ORDER — MAGNESIUM CITRATE PO SOLN
1.0000 | Freq: Once | ORAL | Status: AC
  Administered 2018-04-03: 1 via ORAL
  Filled 2018-04-03: qty 296

## 2018-04-03 NOTE — Progress Notes (Signed)
[facility] Encounter Note  Patient: Devin Washington / Admit Date: 04/01/2018 / Date of Encounter: 04/03/2018, 9:57 AM   Subjective: Patient had some amount of tachycardia yesterday with ambulation and carvedilol was changed to metoprolol which is working quite well with ambulation and heart rate peaking at the 90 bpm range.  No evidence of congestive heart failure or concerns of myocardial infarction with patient had a fall with laceration now healing  Review of Systems: Positive for: Head pain Negative for: Vision change, hearing change, syncope, dizziness, nausea, vomiting,diarrhea, bloody stool, stomach pain, cough, congestion, diaphoresis, urinary frequency, urinary pain,skin lesions, skin rashes Others previously listed  Objective: Telemetry: Normal sinus rhythm with paced ventricle Physical Exam: Blood pressure (!) 155/72, pulse 78, temperature 98.9 F (37.2 C), temperature source Oral, resp. rate 16, height 5\' 10"  (1.778 m), weight 167 lb 6.4 oz (75.9 kg), SpO2 100 %. Body mass index is 24.02 kg/m. General: Well developed, well nourished, in no acute distress. Head: Normocephalic, atraumatic, sclera non-icteric, no xanthomas, nares are without discharge. Neck: No apparent masses Lungs: Normal respirations with no wheezes, no rhonchi, no rales , no crackles   Heart: Regular rate and rhythm, normal S1 S2, aortic murmur, no rub, no gallop, PMI is normal size and placement, carotid upstroke normal without bruit, jugular venous pressure normal Abdomen: Soft, non-tender, non-distended with normoactive bowel sounds. No hepatosplenomegaly. Abdominal aorta is normal size without bruit Extremities: Trace edema, no clubbing, no cyanosis, no ulcers,  Peripheral: 2+ radial, 2+ femoral, 2+ dorsal pedal pulses Neuro: Alert and oriented. Moves all extremities spontaneously. Psych:  Responds to questions appropriately with a normal affect.   Intake/Output Summary (Last 24  hours) at 04/03/2018 0957 Last data filed at 04/03/2018 0419 Gross per 24 hour  Intake 915 ml  Output 1050 ml  Net -135 ml    Inpatient Medications:  . aspirin EC  81 mg Oral Daily  . benazepril  40 mg Oral Daily  . clopidogrel  75 mg Oral Daily  . docusate sodium  100 mg Oral BID  . feeding supplement (ENSURE ENLIVE)  237 mL Oral BID BM  . furosemide  20 mg Oral Daily  . metoprolol tartrate  25 mg Oral BID  . simvastatin  40 mg Oral Daily  . umeclidinium-vilanterol  1 puff Inhalation BID   Infusions:  . sodium chloride 75 mL/hr at 04/02/18 1614    Labs: Recent Labs    04/01/18 2134 04/03/18 0520  NA 136 140  K 4.2 4.2  CL 104 109  CO2 25 29  GLUCOSE 197* 97  BUN 27* 24*  CREATININE 1.90* 1.63*  CALCIUM 8.8* 8.7*   No results for input(s): AST, ALT, ALKPHOS, BILITOT, PROT, ALBUMIN in the last 72 hours. Recent Labs    04/01/18 1817 04/02/18 0821 04/03/18 0520  WBC 12.1* 10.6  --   NEUTROABS 9.8*  --   --   HGB 7.1* 8.2* 7.4*  HCT 22.2* 24.8* 22.7*  MCV 80.1 81.2  --   PLT 268 192  --    No results for input(s): CKTOTAL, CKMB, TROPONINI in the last 72 hours. Invalid input(s): POCBNP No results for input(s): HGBA1C in the last 72 hours.   Weights: Filed Weights   04/01/18 1726 04/02/18 0348 04/03/18 0419  Weight: 173 lb (78.5 kg) 166 lb 9.6 oz (75.6 kg) 167 lb 6.4 oz (75.9 kg)     Radiology/Studies:  Ct Head Wo Contrast  Result Date: 04/01/2018 CLINICAL DATA:  Mechanical fall, hitting head on ground. EXAM: CT HEAD WITHOUT CONTRAST TECHNIQUE: Contiguous axial images were obtained from the base of the skull through the vertex without intravenous contrast. COMPARISON:  10/22/2006. FINDINGS: Brain: No evidence for acute infarction, hemorrhage, mass lesion, or extra-axial fluid. Ventricular prominence, consistent with hydrocephalus ex vacuo. Hypoattenuation of white matter, likely small vessel disease. Vascular: Calcification of the cavernous internal carotid  arteries consistent with cerebrovascular atherosclerotic disease. No signs of intracranial large vessel occlusion. Skull: No skull fracture. Soft tissue swelling and hematoma over the RIGHT frontal region, with laceration. Sinuses/Orbits: Mild chronic sinus disease.  Negative orbits. Other: None. Compared with priors, there has been mild progressive brain substance loss. IMPRESSION: Atrophy and small vessel disease.  No acute intracranial findings. No skull fracture or intracranial hemorrhage. RIGHT frontal scalp hematoma. Electronically Signed   By: Staci Righter M.D.   On: 04/01/2018 18:24     Assessment and Recommendation  84 y.o. male with known chronic systolic dysfunction congestive heart failure mild aortic valve stenosis essential hypertension chronic kidney disease stage III with paroxysmal nonvalvular atrial fibrillation remaining in normal rhythm at this time on appropriate medication management with good heart rate control and likely exacerbation due to anemia now further improved after plaque red blood spells and no evidence of acute bleeding complications 1.  Continue metoprolol for heart rate control and maintenance of normal sinus rhythm 2.  No anticoagulation due to concerns of chronic kidney disease stage III with chronic anemia until further evaluated for significant risk 3.  Single antiplatelet therapy for peripheral vascular disease due to significant anemia 4.  No further cardiac diagnostics necessary at this time 5.  Continue ambulation and follow for improvements of symptoms and possible discharge home from a cardiac standpoint with follow-up next week  Signed, Serafina Royals M.D. FACC

## 2018-04-03 NOTE — Progress Notes (Signed)
[facility] NAME: Devin Washington    MR#:  [id number]  DATE OF BIRTH:  12/31/1922  SUBJECTIVE:  CHIEF COMPLAINT: Patient is resting comfortably.  Denies any active bleeding, hemoglobin dropped from 8.2-7.4 after 1 unit blood transfusion.  Denies any abdominal pain denies any chest pain or shortness of breath.    Patient lives independently and still drives at  baseline  REVIEW OF SYSTEMS:  CONSTITUTIONAL: No fever, fatigue or weakness.  EYES: No blurred or double vision.  EARS, NOSE, AND THROAT: No tinnitus or ear pain.  RESPIRATORY: No cough, shortness of breath, wheezing or hemoptysis.  CARDIOVASCULAR: No chest pain, orthopnea, edema.  GASTROINTESTINAL: No nausea, vomiting, diarrhea or abdominal pain.  GENITOURINARY: No dysuria, hematuria.  ENDOCRINE: No polyuria, nocturia,  HEMATOLOGY: No anemia, easy bruising or bleeding SKIN: No rash or lesion. MUSCULOSKELETAL: No joint pain or arthritis.   NEUROLOGIC: No tingling, numbness, weakness.  PSYCHIATRY: No anxiety or depression.   DRUG ALLERGIES:  No Known Allergies  VITALS:  Blood pressure (!) 137/58, pulse 80, temperature 98.3 F (36.8 C), temperature source Oral, resp. rate 18, height 5\' 10"  (1.778 m), weight 75.9 kg (167 lb 6.4 oz), SpO2 97 %.  PHYSICAL EXAMINATION:  GENERAL:  84 y.o.-year-old patient lying in the bed with no acute distress.  EYES: Pupils equal, round, reactive to light and accommodation. No scleral icterus. Extraocular muscles intact.  HEENT: Head atraumatic, normocephalic. Oropharynx and nasopharynx clear.  Left scalp laceration status post suturing NECK:  Supple, no jugular venous distention. No thyroid enlargement, no tenderness.  LUNGS: Normal breath sounds bilaterally, no wheezing, rales,rhonchi or crepitation. No use of accessory muscles of respiration.  CARDIOVASCULAR:   irregularly regular ,no murmurs, rubs, or gallops.  ABDOMEN: Soft,  nontender, nondistended. Bowel sounds present. EXTREMITIES: No pedal edema, cyanosis, or clubbing.  NEUROLOGIC: Cranial nerves II through XII are intact. Muscle strength at his baseline in all extremities. Sensation intact. Gait not checked.  PSYCHIATRIC: The patient is alert and oriented x 3.  SKIN: No obvious rash, lesion, or ulcer.    LABORATORY PANEL:   CBC Recent Labs  Lab 04/02/18 0821  04/03/18 1435  WBC 10.6  --   --   HGB 8.2*   < > 8.2*  HCT 24.8*   < > 24.7*  PLT 192  --   --    < > = values in this interval not displayed.   ------------------------------------------------------------------------------------------------------------------  Chemistries  Recent Labs  Lab 04/03/18 0520  NA 140  K 4.2  CL 109  CO2 29  GLUCOSE 97  BUN 24*  CREATININE 1.63*  CALCIUM 8.7*   ------------------------------------------------------------------------------------------------------------------  Cardiac Enzymes No results for input(s): TROPONINI in the last 168 hours. ------------------------------------------------------------------------------------------------------------------  RADIOLOGY:  Dg Chest 2 View  Result Date: 04/03/2018 CLINICAL DATA:  Shortness of breath today.  Ex-smoker. EXAM: CHEST - 2 VIEW COMPARISON:  04/23/2017. FINDINGS: Borderline enlarged cardiac silhouette with a mild interval increase in size. Stable left subclavian pacemaker leads. Clear lungs with normal vascularity. The lungs remain hyperexpanded. Thoracic spine degenerative changes. IMPRESSION: No acute abnormality.  Stable mild changes of COPD. Electronically Signed   By: Claudie Revering M.D.   On: 04/03/2018 13:51    EKG:   Orders placed or performed during the hospital encounter of 04/01/18  . EKG 12-Lead  . EKG 12-Lead  . EKG 12-Lead  . EKG 12-Lead    ASSESSMENT AND PLAN:   This is a  84 year old male admitted for symptomatic anemia.   # Anemia: With history of H. pylori induced  peptic ulcer disease in a.m. 2016 The patient is received 1 unit of packed red blood cells in the emergency department.  Hemoglobin 8.2 after transfusion-dropped down to 7.4 today Seen by GI scheduled for EGD colonoscopy tomorrow Check iron studies, B12 and folate N.p.o. after midnight Discontinued aspirin and Plavix   #Atrial fibrillation with RVR with minimal exertion Monitor patient Coreg changed to metoprolol 25 mg p.o. twice daily Provide gentle hydration with IV fluids Status post 1 unit of blood transfusion  #Orthostatic hypotension status post IV fluids Creatinine 1.9-1.63  #.  Hypertension: Reasonable controlled for age.  Continue benazepril.  Coreg changed to  Metoprolol  #.  CHF: Chronicsystolic with ejection fraction 35%  Continue furosemide.  #.  Fall: Appears mechanical.    Cardiology has evaluated the patient no new recommendations and agreeable with changing Coreg to metoprolol Physical therapy assessment   Patient prefers going home DVT plexus with  SCDs  PT recommend home health PT with supervision and rolling walker with 5 inch wheels    All the records are reviewed and case discussed with Care Management/Social Workerr. Management plans discussed with the patient, family and they are in agreement.  CODE STATUS: dnr  TOTAL TIME TAKING CARE OF THIS PATIENT: 35  minutes.   POSSIBLE D/C IN 1 DAYS, DEPENDING ON CLINICAL CONDITION.  Note: This dictation was prepared with Dragon dictation along with smaller phrase technology. Any transcriptional errors that result from this process are unintentional.   Nicholes Mango M.D on 04/03/2018 at 7:26 PM  Between 7am to 6pm - Pager - [phone] After 6pm go to [email or web address] - password [email or web address] [facility]  [facility]  Office  [phone]  CC: Primary care physician; Albina Billet, MD

## 2018-04-03 NOTE — Consult Note (Signed)
Cephas Darby, MD [address]  [place]  [place], [place] [address]  Main: [phone]  Fax: [phone] Pager: [phone]   Consultation  Referring Provider:     No ref. provider found Primary Care Physician:  Albina Billet, MD Primary Gastroenterologist: Dr Gustavo Lah        Reason for Consultation:     Symptomatic anemia  Date of Admission:  04/01/2018 Date of Consultation:  04/03/2018         HPI:   Devin Washington is a 84 y.o. African-American male with history of paroxysmal A. Fib, peripheral artery disease, cardiomyopathy, EF 35%, stage III chronic kidney disease admitted with symptomatic anemia. His hemoglobin at admission was 7.1 with normal MCV. There is no evidence of chronic liver disease. Patient is on aspirin and Plavix as outpatient. He reports having noticing one episode of dark stool a few days ago. He received blood transfusion on admission. His last hemoglobin was 7.5 on 12/21/2017. He has known history of peptic ulcer disease based on upper endoscopy in 2016, it was secondary to H. Pylori infection confirmed on biopsies. This was treated with triple therapy. Unclear if eradication of the infection is confirmed. Patient denies abdominal pain, nausea, vomiting, rectal bleeding, diarrhea, constipation, weight loss, loss of appetite. He does not take iron supplementation at home  NSAIDs: none  Antiplts/Anticoagulants/Anti thrombotics: aspirin and Plavix for peripheral artery disease  GI Procedures: EGD in 2016 Multiple gastric ulcers and duodenal ulcers, H. Pylori was positive Flexible sigmoidoscopy in 2016 for abnormal CT of the GI tract This was unremarkable  Past Medical History:  Diagnosis Date  . A-fib ([place])   . Cardiomyopathy ([place])   . CHF (congestive heart failure) ([place])   . DJD (degenerative joint disease)   . Duodenal ulcer 08/09/2015  . Dysrhythmia   . Erosive esophagitis   . HH (hiatus hernia)   . HLD (hyperlipidemia)   . Hypertension    . PAD (peripheral artery disease) ([place])   . Peripheral vascular disease ([place])   . Presence of permanent cardiac pacemaker   . Prostate cancer [facility])    Prostatectomy.  . 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 Shortness of breath   . SSS (sick sinus syndrome) [facility])     Past Surgical History:  Procedure Laterality Date  . CATARACT EXTRACTION, BILATERAL    . ESOPHAGOGASTRODUODENOSCOPY N/A 08/09/2015   Procedure: ESOPHAGOGASTRODUODENOSCOPY (EGD) looking in the stomach with a lighted tube to evaluate and treat;  Surgeon: Lollie Sails, MD;  Location: [facility] ENDOSCOPY;  Service: Endoscopy;  Laterality: N/A;  Procedure to be done tomorrow afternoon, 08/08/2015. Awaiting cardiology consult  . EYE SURGERY    . FEMORAL-POPLITEAL BYPASS GRAFT Left 12/16/2017   Procedure: BYPASS GRAFT FEMORAL-POPLITEAL ARTERY ( OPEN POPLITEAL BYPASS );  Surgeon: Algernon Huxley, MD;  Location: [facility] ORS;  Service: Vascular;  Laterality: Left;  . FLEXIBLE SIGMOIDOSCOPY N/A 08/09/2015   Procedure: FLEXIBLE SIGMOIDOSCOPY looking up the rectum into the distal colon with a lighted tube to examine and treat;  Surgeon: Lollie Sails, MD;  Location[facility] ENDOSCOPY;  Service: Endoscopy;  Laterality: N/A;  Procedure to be done tomorrow afternoon, 08/08/2015. Awaiting cardiology consult.  . INSERT / REPLACE / REMOVE PACEMAKER    . JOINT REPLACEMENT Left    left knee replacement  . LOWER EXTREMITY ANGIOGRAPHY Left 11/16/2017   Procedure: LOWER EXTREMITY ANGIOGRAPHY;  Surgeon: Algernon Huxley, MD;  Location: [facility] CV LAB;  Service: Cardiovascular;  Laterality:  Left;  . LOWER EXTREMITY ANGIOGRAPHY Right 11/30/2017   Procedure: LOWER EXTREMITY ANGIOGRAPHY;  Surgeon: Algernon Huxley, MD;  Location: [facility] [place] CV LAB;  Service: Cardiovascular;  Laterality: Right;  . LOWER EXTREMITY ANGIOGRAPHY Left 03/04/2018   Procedure: LOWER EXTREMITY ANGIOGRAPHY;  Surgeon: Algernon Huxley, MD;  Location: [place] CV LAB;  Service: Cardiovascular;   Laterality: Left;  . PACEMAKER INSERTION    . PROSTATE SURGERY    . PROSTATE SURGERY      Prior to Admission medications   Medication Sig Start Date End Date Taking? Authorizing Provider  albuterol (PROVENTIL HFA;VENTOLIN HFA) 108 (90 Base) MCG/ACT inhaler Inhale 2 puffs into the lungs every 6 (six) hours as needed for wheezing or shortness of breath. 04/23/17  Yes Lavonia Drafts, MD  ANORO ELLIPTA 62.5-25 MCG/INH AEPB Inhale 1 puff into the lungs 2 (two) times daily. 04/13/17  Yes [provider]  aspirin EC 81 MG EC tablet Take 1 tablet (81 mg total) by mouth daily. 12/22/17  Yes Dew, Erskine Squibb, MD  benazepril (LOTENSIN) 40 MG tablet Take 40 mg by mouth daily.   Yes [provider]  carvedilol (COREG) 3.125 MG tablet Take 1 tablet by mouth 2 (two) times daily. 04/23/17  Yes [provider]  clopidogrel (PLAVIX) 75 MG tablet Take 1 tablet (75 mg total) by mouth daily. 12/22/17  Yes Dew, Erskine Squibb, MD  furosemide (LASIX) 20 MG tablet Take 1 tablet by mouth daily. 04/23/17  Yes [provider]  simvastatin (ZOCOR) 20 MG tablet Take 40 mg by mouth daily.    Yes [provider]  traMADol (ULTRAM) 50 MG tablet Take 1 tablet (50 mg total) by mouth every 6 (six) hours as needed for moderate pain or severe pain. 12/21/17  Yes Dew, Erskine Squibb, MD  traZODone (DESYREL) 50 MG tablet Take 1 tablet by mouth at bedtime as needed for sleep.  04/13/17  Yes [provider]    Family History  Family history unknown: Yes     Social History   Tobacco Use  . Smoking status: Former Smoker    Last attempt to quit: 03/02/1958    Years since quitting: 60.1  . Smokeless tobacco: Former Network engineer Use Topics  . Alcohol use: No    Alcohol/week: 0.0 oz  . Drug use: No    Allergies as of 04/01/2018  . (No Known Allergies)    Review of Systems:    All systems reviewed and negative except where noted in HPI.   Physical Exam:  Vital signs in last 24 hours: Temp:   [98.3 F (36.8 C)-99.2 F (37.3 C)] 98.3 F (36.8 C) (04/20 1651) Pulse Rate:  [51-80] 80 (04/20 1651) Resp:  [16-18] 18 (04/20 1651) BP: (135-155)/(58-72) 137/58 (04/20 1651) SpO2:  [93 %-100 %] 97 % (04/20 1651) Weight:  [167 lb 6.4 oz (75.9 kg)] 167 lb 6.4 oz (75.9 kg) (04/20 0419) Last BM Date: 04/02/18 General:   Pleasant, cooperative in NAD Head:  Normocephalic and atraumatic. Eyes:   No icterus.   Conjunctiva pink. PERRLA. Ears:  Normal auditory acuity. Neck:  Supple; no masses or thyroidomegaly Lungs: Respirations even and unlabored. Lungs clear to auscultation bilaterally.   No wheezes, crackles, or rhonchi.  Heart:  Regular rate and rhythm;  Without murmur, clicks, rubs or gallops Abdomen:  Soft, nondistended, nontender. Normal bowel sounds. No appreciable masses or hepatomegaly.  No rebound or guarding.  Rectal:  Not performed. Msk:  Symmetrical without gross deformities.  Strength normal  Extremities:  Without edema, cyanosis or clubbing. Neurologic:  Alert and oriented x3;  grossly normal neurologically. Skin:  Intact without significant lesions or rashes. Cervical Nodes:  No significant cervical adenopathy. Psych:  Alert and cooperative. Normal affect.  LAB RESULTS: CBC Latest Ref Rng & Units 04/03/2018 04/03/2018 04/02/2018  WBC 3.8 - 10.6 K/uL - - 10.6  Hemoglobin 13.0 - 18.0 g/dL 8.2(L) 7.4(L) 8.2(L)  Hematocrit 40.0 - 52.0 % 24.7(L) 22.7(L) 24.8(L)  Platelets 150 - 440 K/uL - - 192    BMET BMP Latest Ref Rng & Units 04/03/2018 04/01/2018 03/02/2018  Glucose 65 - 99 mg/dL 97 197(H) -  BUN 6 - 20 mg/dL 24(H) 27(H) 22(H)  Creatinine 0.61 - 1.24 mg/dL 1.63(H) 1.90(H) 1.56(H)  Sodium 135 - 145 mmol/L 140 136 -  Potassium 3.5 - 5.1 mmol/L 4.2 4.2 -  Chloride 101 - 111 mmol/L 109 104 -  CO2 22 - 32 mmol/L 29 25 -  Calcium 8.9 - 10.3 mg/dL 8.7(L) 8.8(L) -    LFT Hepatic Function Latest Ref Rng & Units 12/13/2016 08/06/2015  Total Protein 6.5 - 8.1 g/dL 7.2 7.3    Albumin 3.5 - 5.0 g/dL 3.8 4.0  AST 15 - 41 U/L 25 27  ALT 17 - 63 U/L 16(L) 19  Alk Phosphatase 38 - 126 U/L 56 55  Total Bilirubin 0.3 - 1.2 mg/dL 0.2(L) 0.2(L)     STUDIES: Dg Chest 2 View  Result Date: 04/03/2018 CLINICAL DATA:  Shortness of breath today.  Ex-smoker. EXAM: CHEST - 2 VIEW COMPARISON:  04/23/2017. FINDINGS: Borderline enlarged cardiac silhouette with a mild interval increase in size. Stable left subclavian pacemaker leads. Clear lungs with normal vascularity. The lungs remain hyperexpanded. Thoracic spine degenerative changes. IMPRESSION: No acute abnormality.  Stable mild changes of COPD. Electronically Signed   By: Claudie Revering M.D.   On: 04/03/2018 13:51      Impression / Plan:   IZEAH VOSSLER is a 84 y.o. African-American male with stage III chronic kidney disease, paroxysmal A. Fib, EF of 35%, peripheral artery disease on aspirin and Plavix with history of H. Pylori induced peptic ulcer disease in 2016, status post triple therapy presents with worsening of symptomatic anemia without evidence of active GI bleed  - Recommend upper endoscopy and colonoscopy for further evaluation - Check iron studies, B12 and folate levels - clear liquid diet - Nothing by mouth past midnight - Bowel prep  I have discussed alternative options, risks & benefits,  which include, but are not limited to, bleeding, infection, perforation,respiratory complication & drug reaction.  The patient agrees with this plan & written consent will be obtained.     Thank you for involving me in the care of this patient.      LOS: 0 days   Sherri Sear, MD  04/03/2018, 6:55 PM   Note: This dictation was prepared with Dragon dictation along with smaller phrase technology. Any transcriptional errors that result from this process are unintentional.

## 2018-04-03 NOTE — Evaluation (Signed)
Physical Therapy Evaluation Patient Details Name: Devin Washington MRN: [id number] DOB: [birth year] Today's Date: 04/03/2018   History of Present Illness  84 yo male with onset of fall from risky reaching to repair a window from his porch, striking head on the window brick frame.  Has been transfused, has some blood on his pillow from overnight.  Tachycardia controlled, has PMHx:  anemia, CHF, CKD 3, aortic valve stenosis, HTN, pacemaker, SSS, PVD, cardiomyopathy, a-fib  Clinical Impression  Pt is demonstrating some changes with standing balance since his fall, along with some chronic LE pain making things more unsteady.  Even so, with RW can balance and ambulate much better than with SPC.  He is hoping to transition directly home and would be better if family can initially come to stay 24/7 for a couple days, then allow more I.  He is recommended to transition to outpatient for further therapy when HHPT is completed.    Follow Up Recommendations Home health PT;Supervision for mobility/OOB(family would be useful to be more available initially)    Equipment Recommendations  Rolling walker with 5" wheels(if his walker is not appropriate and in good repair)    Recommendations for Other Services       Precautions / Restrictions Precautions Precautions: Fall Precaution Comments: SPC walk prev Restrictions Weight Bearing Restrictions: No      Mobility  Bed Mobility Overal bed mobility: Modified Independent             General bed mobility comments: able to get to side of bed with no assistance  Transfers Overall transfer level: Needs assistance Equipment used: Rolling walker (2 wheeled);1 person hand held assist Transfers: Sit to/from Stand Sit to Stand: Supervision         General transfer comment: safety cues but pt is able to stand from lower surface  Ambulation/Gait Ambulation/Gait assistance: Min guard(for safety) Ambulation Distance (Feet): 150 Feet Assistive device:  Rolling walker (2 wheeled);1 person hand held assist Gait Pattern/deviations: Step-through pattern;Decreased stride length;Wide base of support;Trunk flexed Gait velocity: reduced Gait velocity interpretation: <1.31 ft/sec, indicative of household ambulator General Gait Details: posture is in mild flexion from joint stiffness  Stairs            Wheelchair Mobility    Modified Rankin (Stroke Patients Only)       Balance Overall balance assessment: History of Falls;Needs assistance Sitting-balance support: Feet supported Sitting balance-Leahy Scale: Good     Standing balance support: Bilateral upper extremity supported;During functional activity Standing balance-Leahy Scale: Fair Standing balance comment: less than fair dynamically                             Pertinent Vitals/Pain Pain Assessment: 0-10 Pain Score: 2  Pain Location: R knee Pain Intervention(s): Limited activity within patient's tolerance;Monitored during session;Repositioned    Home Living Family/patient expects to be discharged to:: Private residence Living Arrangements: Alone Available Help at Discharge: Family;Available PRN/intermittently Type of Home: House Home Access: Stairs to enter Entrance Stairs-Rails: Right;Left;Can reach both Entrance Stairs-Number of Steps: 1 Home Layout: One level Home Equipment: Walker - 2 wheels;Cane - single point;Grab bars - tub/shower;Shower seat Additional Comments: walker was wife's and may be worn out    Prior Function Level of Independence: Independent with assistive device(s)         Comments: Driving, using SPC and has help with cleaning and cooking     Hand Dominance   Dominant Hand: Right  Extremity/Trunk Assessment   Upper Extremity Assessment Upper Extremity Assessment: Overall WFL for tasks assessed    Lower Extremity Assessment Lower Extremity Assessment: Generalized weakness    Cervical / Trunk Assessment Cervical /  Trunk Assessment: Kyphotic  Communication   Communication: No difficulties  Cognition Arousal/Alertness: Awake/alert Behavior During Therapy: WFL for tasks assessed/performed Overall Cognitive Status: Within Functional Limits for tasks assessed                                        General Comments      Exercises Other Exercises Other Exercises: has strength of 5 quads but hams and hip abd, DF are 4- to 4   Assessment/Plan    PT Assessment Patient needs continued PT services  PT Problem List Decreased strength;Decreased range of motion;Decreased activity tolerance;Decreased balance;Decreased mobility;Decreased coordination;Decreased knowledge of use of DME;Decreased safety awareness;Decreased skin integrity;Pain       PT Treatment Interventions DME instruction;Gait training;Stair training;Functional mobility training;Therapeutic activities;Therapeutic exercise;Balance training;Neuromuscular re-education;Patient/family education    PT Goals (Current goals can be found in the Care Plan section)  Acute Rehab PT Goals Patient Stated Goal: to get home and feel stronger PT Goal Formulation: With patient Time For Goal Achievement: 04/17/18 Potential to Achieve Goals: Good    Frequency Min 2X/week   Barriers to discharge Decreased caregiver support has PRN help for home    Co-evaluation               AM-PAC PT "6 Clicks" Daily Activity  Outcome Measure Difficulty turning over in bed (including adjusting bedclothes, sheets and blankets)?: A Little Difficulty moving from lying on back to sitting on the side of the bed? : A Little Difficulty sitting down on and standing up from a chair with arms (e.g., wheelchair, bedside commode, etc,.)?: Unable Help needed moving to and from a bed to chair (including a wheelchair)?: A Little Help needed walking in hospital room?: A Little Help needed climbing 3-5 steps with a railing? : A Little 6 Click Score: 16     End of Session Equipment Utilized During Treatment: Gait belt Activity Tolerance: Patient tolerated treatment well;Patient limited by pain Patient left: in chair;with call bell/phone within reach;with chair alarm set Nurse Communication: Mobility status PT Visit Diagnosis: Unsteadiness on feet (R26.81);History of falling (Z91.81);Muscle weakness (generalized) (M62.81)    Time: 5427-0623 PT Time Calculation (min) (ACUTE ONLY): 26 min   Charges:   PT Evaluation $PT Eval Moderate Complexity: 1 Mod PT Treatments $Gait Training: 8-22 mins   PT G Codes:   PT G-Codes **NOT FOR INPATIENT CLASS** Functional Assessment Tool Used: AM-PAC 6 Clicks Basic Mobility    Ramond Dial 04/03/2018, 11:44 AM   Mee Hives, PT MS Acute Rehab Dept. Number: [place] [facility] [place]

## 2018-04-03 NOTE — Care Management (Signed)
  Possible discharge later today per Dr. Margaretmary Eddy. Physical therapy evaluation completed. Recommending home with Home Health and Physical therapy. Spoke to Devin Washington at the bedside. States he has had Home Health in the past.  Record reviewed. [facility] was arranged in February 2019. Would like [facility] again Shelbie Ammons RN MSN CCM Care Management [phone]

## 2018-04-03 NOTE — Progress Notes (Signed)
Questioned MD about Aspirin and Plavix, MD put orders in to discontinue medications. Medications were not given. Will continue to monitor patient.

## 2018-04-03 NOTE — Progress Notes (Signed)
Rounding on patient with MD. Per MD stop the fluids. Fluids stopped, will continue to monitor patient.   GI paged, GI called back stating they are in surgery but will be rounding on patient later today. Will continue to monitor patient.

## 2018-04-03 NOTE — Progress Notes (Signed)
Relayed to night nurse magnesium citrate still needed to be administered. Nursing will continue to monitor.

## 2018-04-03 NOTE — Progress Notes (Signed)
Tap water enema performed. Patient tolerated well. Will continue to monitor patient.

## 2018-04-03 NOTE — Progress Notes (Signed)
GI MD paged to verify procedures for tomorrow. MD verified, colonoscopy and EGD will be done tomorrow. Verbal orders to give magnesium citrate. Will continue to monitor patient. Patient started on colon prep. Tolerating well.

## 2018-04-04 ENCOUNTER — Observation Stay: Payer: Medicare HMO | Admitting: Anesthesiology

## 2018-04-04 ENCOUNTER — Encounter
Admission: EM | Disposition: A | Discharge status: Home Health | Payer: Self-pay | Source: Home / Self Care | Attending: Internal Medicine

## 2018-04-04 DIAGNOSIS — Z8719 Personal history of other diseases of the digestive system: Secondary | ICD-10-CM | POA: Diagnosis not present

## 2018-04-04 DIAGNOSIS — K2970 Gastritis, unspecified, without bleeding: Secondary | ICD-10-CM | POA: Diagnosis not present

## 2018-04-04 DIAGNOSIS — K648 Other hemorrhoids: Secondary | ICD-10-CM | POA: Diagnosis not present

## 2018-04-04 DIAGNOSIS — K449 Diaphragmatic hernia without obstruction or gangrene: Secondary | ICD-10-CM

## 2018-04-04 DIAGNOSIS — K3189 Other diseases of stomach and duodenum: Secondary | ICD-10-CM

## 2018-04-04 DIAGNOSIS — N183 Chronic kidney disease, stage 3 (moderate): Secondary | ICD-10-CM | POA: Diagnosis not present

## 2018-04-04 DIAGNOSIS — I951 Orthostatic hypotension: Secondary | ICD-10-CM | POA: Diagnosis not present

## 2018-04-04 DIAGNOSIS — K269 Duodenal ulcer, unspecified as acute or chronic, without hemorrhage or perforation: Secondary | ICD-10-CM

## 2018-04-04 DIAGNOSIS — D120 Benign neoplasm of cecum: Secondary | ICD-10-CM

## 2018-04-04 DIAGNOSIS — Z8711 Personal history of peptic ulcer disease: Secondary | ICD-10-CM | POA: Diagnosis not present

## 2018-04-04 DIAGNOSIS — I5032 Chronic diastolic (congestive) heart failure: Secondary | ICD-10-CM | POA: Diagnosis not present

## 2018-04-04 DIAGNOSIS — D509 Iron deficiency anemia, unspecified: Secondary | ICD-10-CM | POA: Diagnosis not present

## 2018-04-04 DIAGNOSIS — D649 Anemia, unspecified: Secondary | ICD-10-CM | POA: Diagnosis not present

## 2018-04-04 DIAGNOSIS — I509 Heart failure, unspecified: Secondary | ICD-10-CM | POA: Diagnosis not present

## 2018-04-04 DIAGNOSIS — I10 Essential (primary) hypertension: Secondary | ICD-10-CM | POA: Diagnosis not present

## 2018-04-04 DIAGNOSIS — D62 Acute posthemorrhagic anemia: Secondary | ICD-10-CM

## 2018-04-04 DIAGNOSIS — I130 Hypertensive heart and chronic kidney disease with heart failure and stage 1 through stage 4 chronic kidney disease, or unspecified chronic kidney disease: Secondary | ICD-10-CM | POA: Diagnosis not present

## 2018-04-04 DIAGNOSIS — K2930 Chronic superficial gastritis without bleeding: Secondary | ICD-10-CM | POA: Diagnosis not present

## 2018-04-04 HISTORY — PX: ESOPHAGOGASTRODUODENOSCOPY: SHX5428

## 2018-04-04 HISTORY — PX: COLONOSCOPY: SHX5424

## 2018-04-04 LAB — BASIC METABOLIC PANEL
ANION GAP: 4 — AB (ref 5–15)
BUN: 24 mg/dL — AB (ref 6–20)
CO2: 26 mmol/L (ref 22–32)
Calcium: 8.9 mg/dL (ref 8.9–10.3)
Chloride: 105 mmol/L (ref 101–111)
Creatinine, Ser: 1.46 mg/dL — ABNORMAL HIGH (ref 0.61–1.24)
GFR calc Af Amer: 46 mL/min — ABNORMAL LOW (ref >60)
GFR, EST NON AFRICAN AMERICAN: 40 mL/min — AB (ref >60)
GLUCOSE: 113 mg/dL — AB (ref 65–99)
POTASSIUM: 4.1 mmol/L (ref 3.5–5.1)
SODIUM: 135 mmol/L (ref 135–145)

## 2018-04-04 LAB — CBC
HCT: 24 % — ABNORMAL LOW (ref 40.0–52.0)
Hemoglobin: 7.8 g/dL — ABNORMAL LOW (ref 13.0–18.0)
MCH: 26.4 pg (ref 26.0–34.0)
MCHC: 32.5 g/dL (ref 32.0–36.0)
MCV: 81.3 fL (ref 80.0–100.0)
PLATELETS: 205 10*3/uL (ref 150–440)
RBC: 2.95 MIL/uL — AB (ref 4.40–5.90)
RDW: 19.1 % — ABNORMAL HIGH (ref 11.5–14.5)
WBC: 14.7 10*3/uL — AB (ref 3.8–10.6)

## 2018-04-04 SURGERY — EGD (ESOPHAGOGASTRODUODENOSCOPY)
Anesthesia: General

## 2018-04-04 MED ORDER — PROPOFOL 500 MG/50ML IV EMUL
INTRAVENOUS | Status: AC
  Filled 2018-04-04: qty 50

## 2018-04-04 MED ORDER — PROPOFOL 10 MG/ML IV BOLUS
INTRAVENOUS | Status: DC | PRN
  Administered 2018-04-04: 50 mg via INTRAVENOUS
  Administered 2018-04-04: 20 mg via INTRAVENOUS

## 2018-04-04 MED ORDER — PROPOFOL 500 MG/50ML IV EMUL
INTRAVENOUS | Status: DC | PRN
  Administered 2018-04-04: 30 ug/kg/min via INTRAVENOUS

## 2018-04-04 MED ORDER — METOPROLOL TARTRATE 5 MG/5ML IV SOLN: 5.0000 mg | Freq: Four times a day (QID) | INTRAVENOUS | Status: DC | PRN

## 2018-04-04 MED ORDER — SODIUM CHLORIDE 0.9% FLUSH
3.0000 mL | Freq: Two times a day (BID) | INTRAVENOUS | Status: DC
  Administered 2018-04-04 – 2018-04-06 (×5): 3 mL via INTRAVENOUS

## 2018-04-04 MED ORDER — ASPIRIN EC 81 MG PO TBEC
81.0000 mg | DELAYED_RELEASE_TABLET | Freq: Every day | ORAL | Status: DC
  Administered 2018-04-04 – 2018-04-06 (×3): 81 mg via ORAL
  Filled 2018-04-04 (×6): qty 1

## 2018-04-04 MED ORDER — CLOPIDOGREL BISULFATE 75 MG PO TABS
75.0000 mg | ORAL_TABLET | Freq: Every day | ORAL | Status: DC
  Filled 2018-04-04 (×3): qty 1

## 2018-04-04 MED ORDER — PANTOPRAZOLE SODIUM 40 MG PO TBEC
40.0000 mg | DELAYED_RELEASE_TABLET | Freq: Two times a day (BID) | ORAL | Status: DC
  Administered 2018-04-04 – 2018-04-06 (×5): 40 mg via ORAL
  Filled 2018-04-04 (×5): qty 1

## 2018-04-04 MED ORDER — SODIUM CHLORIDE 0.9 % IV SOLN
200.0000 mg | Freq: Once | INTRAVENOUS | Status: AC
  Administered 2018-04-04: 200 mg via INTRAVENOUS
  Filled 2018-04-04: qty 10

## 2018-04-04 MED ORDER — PHENYLEPHRINE HCL 10 MG/ML IJ SOLN
INTRAMUSCULAR | Status: DC | PRN
  Administered 2018-04-04 (×2): 50 ug via INTRAVENOUS

## 2018-04-04 NOTE — Progress Notes (Signed)
[place] [facility] [place] NAME: Devin Washington    MR#:  [id number]  DATE OF BIRTH:  02/16/1923  SUBJECTIVE:  CHIEF COMPLAINT: Patient is resting comfortably, awaiting for procedures today, denies any active bleeding. Denies any abdominal pain denies any chest pain or shortness of breath.    Patient lives independently and still drives at  baseline  REVIEW OF SYSTEMS:  CONSTITUTIONAL: No fever, fatigue or weakness.  EYES: No blurred or double vision.  EARS, NOSE, AND THROAT: No tinnitus or ear pain.  RESPIRATORY: No cough, shortness of breath, wheezing or hemoptysis.  CARDIOVASCULAR: No chest pain, orthopnea, edema.  GASTROINTESTINAL: No nausea, vomiting, diarrhea or abdominal pain.  GENITOURINARY: No dysuria, hematuria.  ENDOCRINE: No polyuria, nocturia,  HEMATOLOGY: No anemia, easy bruising or bleeding SKIN: No rash or lesion. MUSCULOSKELETAL: No joint pain or arthritis.   NEUROLOGIC: No tingling, numbness, weakness.  PSYCHIATRY: No anxiety or depression.   DRUG ALLERGIES:  No Known Allergies  VITALS:  Blood pressure (!) 151/78, pulse 69, temperature 98.1 F (36.7 C), temperature source Oral, resp. rate 16, height 5\' 10"  (1.778 m), weight 76 kg (167 lb 9.6 oz), SpO2 100 %.  PHYSICAL EXAMINATION:  GENERAL:  84 y.o.-year-old patient lying in the bed with no acute distress.  EYES: Pupils equal, round, reactive to light and accommodation. No scleral icterus. Extraocular muscles intact.  HEENT: Head atraumatic, normocephalic. Oropharynx and nasopharynx clear.  Left scalp laceration status post suturing NECK:  Supple, no jugular venous distention. No thyroid enlargement, no tenderness.  LUNGS: Normal breath sounds bilaterally, no wheezing, rales,rhonchi or crepitation. No use of accessory muscles of respiration.  CARDIOVASCULAR:   irregularly regular ,no murmurs, rubs, or gallops.  ABDOMEN: Soft, nontender, nondistended. Bowel sounds  present. EXTREMITIES: No pedal edema, cyanosis, or clubbing.  NEUROLOGIC: Cranial nerves II through XII are intact. Muscle strength at his baseline in all extremities. Sensation intact. Gait not checked.  PSYCHIATRIC: The patient is alert and oriented x 3.  SKIN: No obvious rash, lesion, or ulcer.    LABORATORY PANEL:   CBC Recent Labs  Lab 04/04/18 0151  WBC 14.7*  HGB 7.8*  HCT 24.0*  PLT 205   ------------------------------------------------------------------------------------------------------------------  Chemistries  Recent Labs  Lab 04/04/18 0151  NA 135  K 4.1  CL 105  CO2 26  GLUCOSE 113*  BUN 24*  CREATININE 1.46*  CALCIUM 8.9   ------------------------------------------------------------------------------------------------------------------  Cardiac Enzymes No results for input(s): TROPONINI in the last 168 hours. ------------------------------------------------------------------------------------------------------------------  RADIOLOGY:  Dg Chest 2 View  Result Date: 04/03/2018 CLINICAL DATA:  Shortness of breath today.  Ex-smoker. EXAM: CHEST - 2 VIEW COMPARISON:  04/23/2017. FINDINGS: Borderline enlarged cardiac silhouette with a mild interval increase in size. Stable left subclavian pacemaker leads. Clear lungs with normal vascularity. The lungs remain hyperexpanded. Thoracic spine degenerative changes. IMPRESSION: No acute abnormality.  Stable mild changes of COPD. Electronically Signed   By: Claudie Revering M.D.   On: 04/03/2018 13:51    EKG:   Orders placed or performed during the hospital encounter of 04/01/18  . EKG 12-Lead  . EKG 12-Lead  . EKG 12-Lead  . EKG 12-Lead    ASSESSMENT AND PLAN:   This is a 84 year old male admitted for symptomatic anemia.   # Iron deficiency anemia: With history of H. pylori induced peptic ulcer disease in a.m. 2016 The patient is received 1 unit of packed red blood cells in the emergency department.   Hemoglobin 8.2  after transfusion-dropped down to 7.8 today Seen by GI scheduled for EGD colonoscopy today Folate and B12 are in normal range Iron at 27, TIBC 229 Provide IV iron N.p.o. after midnight Discontinued aspirin and Plavix   #Atrial fibrillation with RVR with minimal exertion Rate controlled Coreg changed to metoprolol 25 mg p.o. twice daily Provided gentle hydration with IV fluids Status post 1 unit of blood transfusion  #Orthostatic hypotension status post IV fluids Patient is a symptomatic Creatinine 1.9-1.63--1.46  #.  Hypertension: Reasonable controlled for age.  Continue benazepril.  Coreg changed to  Metoprolol  #.  CHF: Chronicsystolic with ejection fraction 35%  Continue furosemide.  #.  Fall: Appears mechanical.    Cardiology has evaluated the patient no new recommendations and agreeable with changing Coreg to metoprolol Physical therapy assessment   Patient prefers going home DVT plexus with  SCDs  PT recommend home health PT with supervision and rolling walker with 5 inch wheels    All the records are reviewed and case discussed with Care Management/Social Workerr. Management plans discussed with the patient, family and they are in agreement.  CODE STATUS: dnr  TOTAL TIME TAKING CARE OF THIS PATIENT: 35  minutes.   POSSIBLE D/C IN 1 DAYS, DEPENDING ON CLINICAL CONDITION.  Note: This dictation was prepared with Dragon dictation along with smaller phrase technology. Any transcriptional errors that result from this process are unintentional.   Nicholes Mango M.D on 04/04/2018 at 12:03 PM  Between 7am to 6pm - Pager - [phone] After 6pm go to [email or web address] - password [email or web address]  [facility]  Office  [phone]  CC: Primary care physician; Albina Billet, MD

## 2018-04-04 NOTE — Addendum Note (Signed)
Addendum  created 04/04/18 1830 by Gunnar Bulla, MD   Sign clinical note

## 2018-04-04 NOTE — Progress Notes (Signed)
EGD and colonoscopy post procedure note:  EGD was essentially unremarkable, gastric biopsies were performed Colonoscopy revealed subcentimeter polyp in the cecum that was resected and clipped was placed at the polypectomy base Large internal hemorrhoids and prominent rectal varices with no stigmata of recent bleeding No upper or lower GI source of bleeding identified  Recs: Resume diet He does not have iron deficiency, B12 or folate deficiency Iron studies are consistent with anemia of chronic disease, he also has stage III chronic kidney disease  Follow-up with nephrology and hematology as outpatient No further GI workup at this point Follow-up pathology results to confirm eradication of H. Pylori Restart aspirin today, Plavix tomorrow   Cephas Darby, MD [address]  [place]  [place], [place] [address]  Main: [phone]  Fax: [phone] Pager: [phone]

## 2018-04-04 NOTE — Op Note (Signed)
[facility] Gastroenterology Patient Name: Devin Washington Procedure Date: 04/04/2018 3:46 PM MRN: [id number] Account #: [id number] Date of Birth: 06-Aug-1923 Admit Type: Inpatient Age: 84 Room: [facility] ENDO ROOM 4 Gender: Male Note Status: Finalized Procedure:            Colonoscopy Indications:          Unexplained iron deficiency anemia Providers:            Lin Landsman MD, MD Medicines:            Monitored Anesthesia Care Complications:        No immediate complications. Estimated blood loss: None. Procedure:            Pre-Anesthesia Assessment:                       - Prior to the procedure, a History and Physical was                        performed, and patient medications and allergies were                        reviewed. The patient is competent. The risks and                        benefits of the procedure and the sedation options and                        risks were discussed with the patient. All questions                        were answered and informed consent was obtained.                        Patient identification and proposed procedure were                        verified by the physician, the nurse, the                        anesthesiologist, the anesthetist and the technician in                        the pre-procedure area in the procedure room in the                        endoscopy suite. Mental Status Examination: alert and                        oriented. Airway Examination: normal oropharyngeal                        airway and neck mobility. Respiratory Examination:                        clear to auscultation. CV Examination: normal.                        Prophylactic Antibiotics: The patient does not require  prophylactic antibiotics. Prior Anticoagulants: The                        patient has taken Plavix (clopidogrel), last dose was 2                        days prior to procedure. ASA Grade  Assessment: III - A                        patient with severe systemic disease. After reviewing                        the risks and benefits, the patient was deemed in                        satisfactory condition to undergo the procedure. The                        anesthesia plan was to use monitored anesthesia care                        (MAC). Immediately prior to administration of                        medications, the patient was re-assessed for adequacy                        to receive sedatives. The heart rate, respiratory rate,                        oxygen saturations, blood pressure, adequacy of                        pulmonary ventilation, and response to care were                        monitored throughout the procedure. The physical status                        of the patient was re-assessed after the procedure.                       After obtaining informed consent, the colonoscope was                        passed under direct vision. Throughout the procedure,                        the patient's blood pressure, pulse, and oxygen                        saturations were monitored continuously. The                        Colonoscope was introduced through the anus and                        advanced to the the cecum, identified by appendiceal  orifice and ileocecal valve. The colonoscopy was                        performed without difficulty. The patient tolerated the                        procedure well. The quality of the bowel preparation                        was adequate to identify polyps 6 mm and larger in size. Findings:      Hemorrhoids were found on perianal exam.      A 7 mm polyp was found in the cecum. The polyp was sessile. The polyp       was removed with a hot snare. Resection and retrieval were complete. To       prevent bleeding after the polypectomy, one hemostatic clip was       successfully placed. There was no bleeding  during, or at the end, of the       procedure.      Non-bleeding internal hemorrhoids were found during retroflexion. The       hemorrhoids were large. Prominent rectal varices      The exam was otherwise without abnormality. Impression:           - Hemorrhoids found on perianal exam.                       - One 7 mm polyp in the cecum, removed with a hot                        snare. Resected and retrieved. Clip was placed.                       - Non-bleeding internal hemorrhoids.                       - The examination was otherwise normal. Recommendation:       - Return patient to hospital ward for ongoing care.                       - Resume previous diet today.                       - Continue present medications.                       - Resume Plavix (clopidogrel) at prior dose tomorrow.                       - Await pathology results. Procedure Code(s):    --- Professional ---                       [phone], Colonoscopy, flexible; with removal of tumor(s),                        polyp(s), or other lesion(s) by snare technique Diagnosis Code(s):    --- Professional ---                       K64.8, Other hemorrhoids  D12.0, Benign neoplasm of cecum                       D50.9, Iron deficiency anemia, unspecified CPT copyright 2017 American Medical Association. All rights reserved. The codes documented in this report are preliminary and upon coder review may  be revised to meet current compliance requirements. Dr. Ulyess Mort Lin Landsman MD, MD 04/04/2018 4:53:47 PM This report has been signed electronically. Number of Addenda: 0 Note Initiated On: 04/04/2018 3:46 PM Scope Withdrawal Time: 0 hours 8 minutes 48 seconds  Total Procedure Duration: 0 hours 19 minutes 37 seconds       [facility]

## 2018-04-04 NOTE — Progress Notes (Signed)
Pt. Completed go-lyt ely and mag citrate. Stool  is liquid with a few flakes in it. Pt. Slept well once stool slowed down. Pt. Has been NPO since midnight.  No c/o pain, SOB or acute distress noted.

## 2018-04-04 NOTE — Transfer of Care (Signed)
Immediate Anesthesia Transfer of Care Note  Patient: Devin Washington  Procedure(s) Performed: ESOPHAGOGASTRODUODENOSCOPY (EGD) (N/A ) COLONOSCOPY (N/A )  Patient Location: PACU  Anesthesia Type:General  Level of Consciousness: sedated and patient cooperative  Airway & Oxygen Therapy: Patient connected to nasal cannula oxygen  Post-op Assessment: Report given to RN and Post -op Vital signs reviewed and stable  Post vital signs: Reviewed and stable  Last Vitals:  Vitals Value Taken Time  BP    Temp    Pulse    Resp    SpO2      Last Pain:  Vitals:   04/04/18 1554  TempSrc: Tympanic  PainSc:          Complications: No apparent anesthesia complications

## 2018-04-04 NOTE — Anesthesia Postprocedure Evaluation (Signed)
Anesthesia Post Note  Patient: Devin Washington  Procedure(s) Performed: ESOPHAGOGASTRODUODENOSCOPY (EGD) (N/A ) COLONOSCOPY (N/A )  Patient location during evaluation: Endoscopy Anesthesia Type: General Level of consciousness: awake and alert Pain management: pain level controlled Vital Signs Assessment: post-procedure vital signs reviewed and stable Respiratory status: spontaneous breathing, nonlabored ventilation, respiratory function stable and patient connected to nasal cannula oxygen Cardiovascular status: blood pressure returned to baseline and stable Postop Assessment: no apparent nausea or vomiting Anesthetic complications: no     Last Vitals:  Vitals:   04/04/18 1730 04/04/18 1746  BP: (!) 141/58 (!) 161/73  Pulse: 62 66  Resp: 16   Temp:  36.8 C  SpO2: 100% 100%    Last Pain:  Vitals:   04/04/18 1746  TempSrc: Oral  PainSc:                  Ezrie Bunyan S

## 2018-04-04 NOTE — Op Note (Signed)
[facility] Gastroenterology Patient Name: Devin Washington Procedure Date: 04/04/2018 3:45 PM MRN: [id number] Account #: [id number] Date of Birth: 1923/10/14 Admit Type: Inpatient Age: 84 Room: [facility] ENDO ROOM 4 Gender: Male Note Status: Finalized Procedure:            Upper GI endoscopy Indications:          Follow-up of Helicobacter pylori, Suspected upper                        gastrointestinal bleeding in patient with unexplained                        iron deficiency anemia, Personal history of peptic                        ulcer disease Providers:            Lin Landsman MD, MD Medicines:            Monitored Anesthesia Care Complications:        No immediate complications. Estimated blood loss:                        Minimal. Procedure:            Pre-Anesthesia Assessment:                       - Prior to the procedure, a History and Physical was                        performed, and patient medications and allergies were                        reviewed. The patient is competent. The risks and                        benefits of the procedure and the sedation options and                        risks were discussed with the patient. All questions                        were answered and informed consent was obtained.                        Patient identification and proposed procedure were                        verified by the physician, the nurse, the                        anesthesiologist, the anesthetist and the technician in                        the pre-procedure area in the procedure room in the                        endoscopy suite. Mental Status Examination: alert and                        oriented. Airway Examination:  normal oropharyngeal                        airway and neck mobility. Respiratory Examination:                        clear to auscultation. CV Examination: normal.                        Prophylactic Antibiotics: The patient  does not require                        prophylactic antibiotics. Prior Anticoagulants: The                        patient has taken Plavix (clopidogrel), last dose was 3                        days prior to procedure. ASA Grade Assessment: III - A                        patient with severe systemic disease. After reviewing                        the risks and benefits, the patient was deemed in                        satisfactory condition to undergo the procedure. The                        anesthesia plan was to use monitored anesthesia care                        (MAC). Immediately prior to administration of                        medications, the patient was re-assessed for adequacy                        to receive sedatives. The heart rate, respiratory rate,                        oxygen saturations, blood pressure, adequacy of                        pulmonary ventilation, and response to care were                        monitored throughout the procedure. The physical status                        of the patient was re-assessed after the procedure.                       After obtaining informed consent, the endoscope was                        passed under direct vision. Throughout the procedure,                        the  patient's blood pressure, pulse, and oxygen                        saturations were monitored continuously. The Endoscope                        was introduced through the mouth, and advanced to the                        second part of duodenum. The upper GI endoscopy was                        accomplished without difficulty. The patient tolerated                        the procedure fairly well. Findings:      Many non-obstructing non-bleeding, small superficial duodenal ulcers       with a clean ulcer base (Devin Washington III) were found in the duodenal       bulb.      The second portion of the duodenum was normal.      A few dispersed, diminutive  non-bleeding erosions were found in the       gastric antrum. There were no stigmata of recent bleeding.      A small hiatal hernia was present.      The cardia and gastric fundus were normal on retroflexion.      The cardia, gastric fundus and gastric body were normal. Biopsies were       taken with a cold forceps for Helicobacter pylori testing.      The gastroesophageal junction and examined esophagus were normal. Impression:           - Multiple non-obstructing non-bleeding duodenal ulcers                        with a clean ulcer base (Devin Washington III). Suspicious                        for H. pylori induced etiology.                       - Normal second portion of the duodenum.                       - Non-bleeding erosive gastropathy.                       - Small hiatal hernia.                       - Normal cardia, gastric fundus and gastric body.                        Biopsied.                       - Normal gastroesophageal junction and esophagus. Recommendation:       - Await pathology results.                       - Use a proton pump inhibitor PO daily. Procedure Code(s):    --- Professional ---  37342, Esophagogastroduodenoscopy, flexible, transoral;                        with biopsy, single or multiple Diagnosis Code(s):    --- Professional ---                       K26.9, Duodenal ulcer, unspecified as acute or chronic,                        without hemorrhage or perforation                       K31.89, Other diseases of stomach and duodenum                       K44.9, Diaphragmatic hernia without obstruction or                        gangrene                       A76.81, Helicobacter pylori [H. pylori] as the cause of                        diseases classified elsewhere                       D50.9, Iron deficiency anemia, unspecified                       Z87.11, Personal history of peptic ulcer disease CPT copyright 2017 American Medical  Association. All rights reserved. The codes documented in this report are preliminary and upon coder review may  be revised to meet current compliance requirements. Dr. Ulyess Mort Lin Landsman MD, MD 04/04/2018 4:26:54 PM This report has been signed electronically. Number of Addenda: 0 Note Initiated On: 04/04/2018 3:45 PM      [facility]

## 2018-04-04 NOTE — Anesthesia Post-op Follow-up Note (Signed)
Anesthesia QCDR form completed.        

## 2018-04-04 NOTE — Anesthesia Preprocedure Evaluation (Addendum)
Anesthesia Evaluation  Patient identified by MRN, date of birth, ID band Patient awake    Reviewed: Allergy & Precautions, NPO status , Patient's Chart, lab work & pertinent test results, reviewed documented beta blocker date and time   Airway Mallampati: II  TM Distance: >3 FB     Dental  (+) Chipped   Pulmonary shortness of breath, former smoker,           Cardiovascular hypertension, Pt. on medications and Pt. on home beta blockers + Peripheral Vascular Disease and +CHF  + dysrhythmias Atrial Fibrillation + pacemaker      Neuro/Psych  Neuromuscular disease    GI/Hepatic hiatal hernia, PUD,   Endo/Other    Renal/GU Renal disease     Musculoskeletal  (+) Arthritis ,   Abdominal   Peds  Hematology  (+) anemia ,   Anesthesia Other Findings Pacermaker on L. Fem-pop bypass. On plavix and lasix. CXR and EKG ok. Last Hb 7.8.  Reproductive/Obstetrics                            Anesthesia Physical Anesthesia Plan  ASA: III  Anesthesia Plan: General   Post-op Pain Management:    Induction: Intravenous  PONV Risk Score and Plan:   Airway Management Planned:   Additional Equipment:   Intra-op Plan:   Post-operative Plan:   Informed Consent: I have reviewed the patients History and Physical, chart, labs and discussed the procedure including the risks, benefits and alternatives for the proposed anesthesia with the patient or authorized representative who has indicated his/her understanding and acceptance.     Plan Discussed with: CRNA  Anesthesia Plan Comments:         Anesthesia Quick Evaluation

## 2018-04-04 NOTE — Progress Notes (Signed)
Pt BP elevated upon arrival to unit following GI procedure, MD notified, verbal orders with parameters given for PRN lopressor. Will continue to monitor.

## 2018-04-05 ENCOUNTER — Encounter: Payer: Self-pay | Admitting: Gastroenterology

## 2018-04-05 ENCOUNTER — Other Ambulatory Visit: Payer: Self-pay | Admitting: *Deleted

## 2018-04-05 DIAGNOSIS — Z8546 Personal history of malignant neoplasm of prostate: Secondary | ICD-10-CM | POA: Diagnosis not present

## 2018-04-05 DIAGNOSIS — N179 Acute kidney failure, unspecified: Secondary | ICD-10-CM | POA: Diagnosis present

## 2018-04-05 DIAGNOSIS — D649 Anemia, unspecified: Secondary | ICD-10-CM

## 2018-04-05 DIAGNOSIS — R0602 Shortness of breath: Secondary | ICD-10-CM | POA: Diagnosis not present

## 2018-04-05 DIAGNOSIS — Z7902 Long term (current) use of antithrombotics/antiplatelets: Secondary | ICD-10-CM | POA: Diagnosis not present

## 2018-04-05 DIAGNOSIS — I951 Orthostatic hypotension: Secondary | ICD-10-CM | POA: Diagnosis present

## 2018-04-05 DIAGNOSIS — I5022 Chronic systolic (congestive) heart failure: Secondary | ICD-10-CM | POA: Diagnosis not present

## 2018-04-05 DIAGNOSIS — Z7982 Long term (current) use of aspirin: Secondary | ICD-10-CM | POA: Diagnosis not present

## 2018-04-05 DIAGNOSIS — I130 Hypertensive heart and chronic kidney disease with heart failure and stage 1 through stage 4 chronic kidney disease, or unspecified chronic kidney disease: Secondary | ICD-10-CM | POA: Diagnosis not present

## 2018-04-05 DIAGNOSIS — W19XXXA Unspecified fall, initial encounter: Secondary | ICD-10-CM

## 2018-04-05 DIAGNOSIS — Z87891 Personal history of nicotine dependence: Secondary | ICD-10-CM | POA: Diagnosis not present

## 2018-04-05 DIAGNOSIS — D509 Iron deficiency anemia, unspecified: Secondary | ICD-10-CM | POA: Diagnosis not present

## 2018-04-05 DIAGNOSIS — K269 Duodenal ulcer, unspecified as acute or chronic, without hemorrhage or perforation: Secondary | ICD-10-CM | POA: Diagnosis present

## 2018-04-05 DIAGNOSIS — R809 Proteinuria, unspecified: Secondary | ICD-10-CM | POA: Diagnosis not present

## 2018-04-05 DIAGNOSIS — N2581 Secondary hyperparathyroidism of renal origin: Secondary | ICD-10-CM | POA: Diagnosis not present

## 2018-04-05 DIAGNOSIS — S0101XA Laceration without foreign body of scalp, initial encounter: Secondary | ICD-10-CM

## 2018-04-05 DIAGNOSIS — I480 Paroxysmal atrial fibrillation: Secondary | ICD-10-CM | POA: Diagnosis present

## 2018-04-05 DIAGNOSIS — K648 Other hemorrhoids: Secondary | ICD-10-CM | POA: Diagnosis present

## 2018-04-05 DIAGNOSIS — Z66 Do not resuscitate: Secondary | ICD-10-CM | POA: Diagnosis present

## 2018-04-05 DIAGNOSIS — E782 Mixed hyperlipidemia: Secondary | ICD-10-CM | POA: Diagnosis present

## 2018-04-05 DIAGNOSIS — D62 Acute posthemorrhagic anemia: Secondary | ICD-10-CM | POA: Diagnosis not present

## 2018-04-05 DIAGNOSIS — K319 Disease of stomach and duodenum, unspecified: Secondary | ICD-10-CM | POA: Diagnosis present

## 2018-04-05 DIAGNOSIS — Y92007 Garden or yard of unspecified non-institutional (private) residence as the place of occurrence of the external cause: Secondary | ICD-10-CM | POA: Diagnosis not present

## 2018-04-05 DIAGNOSIS — W1789XA Other fall from one level to another, initial encounter: Secondary | ICD-10-CM | POA: Diagnosis not present

## 2018-04-05 DIAGNOSIS — I10 Essential (primary) hypertension: Secondary | ICD-10-CM | POA: Diagnosis not present

## 2018-04-05 DIAGNOSIS — I5032 Chronic diastolic (congestive) heart failure: Secondary | ICD-10-CM | POA: Diagnosis not present

## 2018-04-05 DIAGNOSIS — D120 Benign neoplasm of cecum: Secondary | ICD-10-CM | POA: Diagnosis not present

## 2018-04-05 DIAGNOSIS — Z7951 Long term (current) use of inhaled steroids: Secondary | ICD-10-CM | POA: Diagnosis not present

## 2018-04-05 DIAGNOSIS — D631 Anemia in chronic kidney disease: Secondary | ICD-10-CM | POA: Diagnosis not present

## 2018-04-05 DIAGNOSIS — I739 Peripheral vascular disease, unspecified: Secondary | ICD-10-CM | POA: Diagnosis present

## 2018-04-05 DIAGNOSIS — Z950 Presence of cardiac pacemaker: Secondary | ICD-10-CM | POA: Diagnosis not present

## 2018-04-05 DIAGNOSIS — N183 Chronic kidney disease, stage 3 (moderate): Secondary | ICD-10-CM | POA: Diagnosis present

## 2018-04-05 DIAGNOSIS — I129 Hypertensive chronic kidney disease with stage 1 through stage 4 chronic kidney disease, or unspecified chronic kidney disease: Secondary | ICD-10-CM | POA: Diagnosis not present

## 2018-04-05 LAB — HEPATIC FUNCTION PANEL
ALK PHOS: 50 U/L (ref 38–126)
ALT: 19 U/L (ref 17–63)
AST: 29 U/L (ref 15–41)
Albumin: 2.9 g/dL — ABNORMAL LOW (ref 3.5–5.0)
BILIRUBIN INDIRECT: 0.9 mg/dL (ref 0.3–0.9)
BILIRUBIN TOTAL: 1.1 mg/dL (ref 0.3–1.2)
Bilirubin, Direct: 0.2 mg/dL (ref 0.1–0.5)
TOTAL PROTEIN: 5.8 g/dL — AB (ref 6.5–8.1)

## 2018-04-05 LAB — DAT, POLYSPECIFIC AHG (ARMC ONLY): Polyspecific AHG test: NEGATIVE

## 2018-04-05 LAB — HEMOGLOBIN AND HEMATOCRIT, BLOOD
HEMATOCRIT: 26.3 % — AB (ref 40.0–52.0)
Hemoglobin: 8.8 g/dL — ABNORMAL LOW (ref 13.0–18.0)

## 2018-04-05 LAB — PREPARE RBC (CROSSMATCH)

## 2018-04-05 LAB — CBC
HEMATOCRIT: 20.3 % — AB (ref 40.0–52.0)
Hemoglobin: 6.7 g/dL — ABNORMAL LOW (ref 13.0–18.0)
MCH: 26.7 pg (ref 26.0–34.0)
MCHC: 32.8 g/dL (ref 32.0–36.0)
MCV: 81.5 fL (ref 80.0–100.0)
PLATELETS: 179 10*3/uL (ref 150–440)
RBC: 2.49 MIL/uL — ABNORMAL LOW (ref 4.40–5.90)
RDW: 19.5 % — AB (ref 11.5–14.5)
WBC: 11.2 10*3/uL — AB (ref 3.8–10.6)

## 2018-04-05 LAB — LACTATE DEHYDROGENASE: LDH: 228 U/L — ABNORMAL HIGH (ref 98–192)

## 2018-04-05 LAB — TECHNOLOGIST SMEAR REVIEW

## 2018-04-05 MED ORDER — SODIUM CHLORIDE 0.9 % IV SOLN
Freq: Once | INTRAVENOUS | Status: AC
Start: 2018-04-05 — End: 2018-04-05
  Administered 2018-04-05: 12:00:00 via INTRAVENOUS

## 2018-04-05 NOTE — Consult Note (Signed)
Hematology/Oncology Consult note [facility] [phone] [phone]  Patient Care Team: Albina Billet, MD as PCP - General (Internal Medicine)   Name of the patient: Devin Washington  [id number]  04-12-1923    Reason for consult: anemia   Requesting physician: Dr. Margaretmary Eddy  Date of visit: 04/05/2018    History of presenting illness-patient is a 84 year old male with a past medical history significant for atrial fibrillation, CHF, hypertension and prostate cancer who presented to the ER after he suffered a fall.  On admission he was found to have H&H of 7.1/22.2 with an MCV of 80.1 and a platelet count of 268.  BMP showed an elevated creatinine of 1.9.  TSH was normal at 0.52.  Patient received 1 unit of PRBC in the ER.  Ferritin checked per day later was 152.  Iron studies revealed low serum iron of 27, low TIBC of 229 and low iron saturation of 12%.  B12 levels were low normal at 264.  Folate level was normal at 17.5.  Reticulocyte count was low at 2% for the degree of anemia.  Patient underwent EGD and colonoscopy on 04/04/2018.  EGD showed nonobstructing nonbleeding superficial duodenal ulcers with a clean ulcer base.  There are nonbleeding erosions in the gastric antrum and no stigmata of recent bleeding.  Duodenum were suspicious for H. pylori.  Colonoscopy showed hemorrhoids and 7 mm cecal polyp  Patient seen by GI again today who plans to do a capsule study. He is getting another prbc transfusion today. He lives alone and his grand daughter lives nearby and takes him for his doctors appointments. He is independent of his ADL's. Denies any blood loss in stool or urine. Feels fatigued. Denies other complaints   ECOG PS- 1    Review of systems- Review of Systems  Constitutional: Positive for malaise/fatigue.    No Known Allergies  Patient Active Problem List   Diagnosis Date Noted  . Acute blood loss anemia   . Symptomatic anemia 04/02/2018   . 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 Aneurysm ([place]) 11/13/2017  . 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 Protein-calorie malnutrition, severe ([place]) 08/08/2015  . Abdominal pain, epigastric 08/06/2015  . Nausea and vomiting 08/06/2015  . HTN (hypertension) 08/06/2015  . HLD (hyperlipidemia) 08/06/2015  . Back pain 08/06/2015  . CKD (chronic kidney disease), stage III ([place]) 08/06/2015  . Abdominal pain 08/06/2015     Past Medical History:  Diagnosis Date  . A-fib ([place])   . Cardiomyopathy ([place])   . CHF (congestive heart failure) ([place])   . DJD (degenerative joint disease)   . Duodenal ulcer 08/09/2015  . Dysrhythmia   . Erosive esophagitis   . HH (hiatus hernia)   . HLD (hyperlipidemia)   . Hypertension   . PAD (peripheral artery disease) ([place])   . Peripheral vascular disease ([place])   . Presence of permanent cardiac pacemaker   . Prostate cancer [facility])    Prostatectomy.  . 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 Shortness of breath   . SSS (sick sinus syndrome) [facility])      Past Surgical History:  Procedure Laterality Date  . CATARACT EXTRACTION, BILATERAL    . ESOPHAGOGASTRODUODENOSCOPY N/A 08/09/2015   Procedure: ESOPHAGOGASTRODUODENOSCOPY (EGD) looking in the stomach with a lighted tube to evaluate and treat;  Surgeon: Lollie Sails, MD;  Location: [facility] ENDOSCOPY;  Service: Endoscopy;  Laterality: N/A;  Procedure to be done tomorrow afternoon, 08/08/2015. Awaiting cardiology consult  .  EYE SURGERY    . FEMORAL-POPLITEAL BYPASS GRAFT Left 12/16/2017   Procedure: BYPASS GRAFT FEMORAL-POPLITEAL ARTERY ( OPEN POPLITEAL BYPASS );  Surgeon: Algernon Huxley, MD;  Location: [facility] ORS;  Service: Vascular;  Laterality: Left;  . FLEXIBLE SIGMOIDOSCOPY N/A 08/09/2015   Procedure: FLEXIBLE SIGMOIDOSCOPY looking up the rectum into the distal colon with a lighted tube to examine and treat;  Surgeon: Lollie Sails, MD;  Location: [facility] ENDOSCOPY;  Service: Endoscopy;   Laterality: N/A;  Procedure to be done tomorrow afternoon, 08/08/2015. Awaiting cardiology consult.  . INSERT / REPLACE / REMOVE PACEMAKER    . JOINT REPLACEMENT Left    left knee replacement  . LOWER EXTREMITY ANGIOGRAPHY Left 11/16/2017   Procedure: LOWER EXTREMITY ANGIOGRAPHY;  Surgeon: Algernon Huxley, MD;  Location: [place] CV LAB;  Service: Cardiovascular;  Laterality: Left;  . LOWER EXTREMITY ANGIOGRAPHY Right 11/30/2017   Procedure: LOWER EXTREMITY ANGIOGRAPHY;  Surgeon: Algernon Huxley, MD;  Location: [place] CV LAB;  Service: Cardiovascular;  Laterality: Right;  . LOWER EXTREMITY ANGIOGRAPHY Left 03/04/2018   Procedure: LOWER EXTREMITY ANGIOGRAPHY;  Surgeon: Algernon Huxley, MD;  Location: [place] CV LAB;  Service: Cardiovascular;  Laterality: Left;  . PACEMAKER INSERTION    . PROSTATE SURGERY    . PROSTATE SURGERY      Social History   Socioeconomic History  . Marital status: Widowed    Spouse name: Not on file  . Number of children: Not on file  . Years of education: Not on file  . Highest education level: Not on file  Occupational History  . Not on file  Social Needs  . Financial resource strain: Not on file  . Food insecurity:    Worry: Not on file    Inability: Not on file  . Transportation needs:    Medical: Not on file    Non-medical: Not on file  Tobacco Use  . Smoking status: Former Smoker    Last attempt to quit: 03/02/1958    Years since quitting: 60.1  . Smokeless tobacco: Former Network engineer and Sexual Activity  . Alcohol use: No    Alcohol/week: 0.0 oz  . Drug use: No  . Sexual activity: Not Currently  Lifestyle  . Physical activity:    Days per week: Not on file    Minutes per session: Not on file  . Stress: Not on file  Relationships  . Social connections:    Talks on phone: Not on file    Gets together: Not on file    Attends religious service: Not on file    Active member of club or organization: Not on file    Attends  meetings of clubs or organizations: Not on file    Relationship status: Not on file  . Intimate partner violence:    Fear of current or ex partner: Not on file    Emotionally abused: Not on file    Physically abused: Not on file    Forced sexual activity: Not on file  Other Topics Concern  . Not on file  Social History Narrative  . Not on file     Family History  Family history unknown: Yes     Current Facility-Administered Medications:  .  0.9 %  sodium chloride infusion, , Intravenous, Continuous, Vanga, Tally Due, MD .  acetaminophen (TYLENOL) tablet 650 mg, 650 mg, Oral, Q6H PRN, 650 mg at 04/02/18 0416 **OR** acetaminophen (TYLENOL) suppository 650 mg, 650 mg, Rectal, Q6H  PRN, Harrie Foreman, MD .  aspirin EC tablet 81 mg, 81 mg, Oral, Daily, Vanga, Tally Due, MD, 81 mg at 04/05/18 0920 .  benazepril (LOTENSIN) tablet 40 mg, 40 mg, Oral, Daily, Harrie Foreman, MD, 40 mg at 04/05/18 0920 .  docusate sodium (COLACE) capsule 100 mg, 100 mg, Oral, BID, Harrie Foreman, MD, 100 mg at 04/05/18 0920 .  feeding supplement (ENSURE ENLIVE) (ENSURE ENLIVE) liquid 237 mL, 237 mL, Oral, BID BM, Gouru, Aruna, MD, 237 mL at 04/03/18 1344 .  furosemide (LASIX) tablet 20 mg, 20 mg, Oral, Daily, Harrie Foreman, MD, 20 mg at 04/05/18 0920 .  metoprolol tartrate (LOPRESSOR) injection 5 mg, 5 mg, Intravenous, Q6H PRN, Gouru, Aruna, MD .  metoprolol tartrate (LOPRESSOR) tablet 25 mg, 25 mg, Oral, BID, Gouru, Aruna, MD, 25 mg at 04/05/18 0920 .  ondansetron (ZOFRAN) tablet 4 mg, 4 mg, Oral, Q6H PRN **OR** ondansetron (ZOFRAN) injection 4 mg, 4 mg, Intravenous, Q6H PRN, Harrie Foreman, MD .  pantoprazole (PROTONIX) EC tablet 40 mg, 40 mg, Oral, BID, Gouru, Aruna, MD, 40 mg at 04/05/18 0920 .  simvastatin (ZOCOR) tablet 40 mg, 40 mg, Oral, Daily, Harrie Foreman, MD, 40 mg at 04/05/18 0920 .  sodium chloride flush (NS) 0.9 % injection 3 mL, 3 mL, Intravenous, Q12H, Gouru,  Aruna, MD, 3 mL at 04/04/18 2133 .  traMADol (ULTRAM) tablet 50 mg, 50 mg, Oral, Q6H PRN, Harrie Foreman, MD .  traZODone (DESYREL) tablet 50 mg, 50 mg, Oral, QHS PRN, Harrie Foreman, MD .  umeclidinium-vilanterol [facility] ELLIPTA) 62.5-25 MCG/INH 1 puff, 1 puff, Inhalation, BID, Harrie Foreman, MD, 1 puff at 04/05/18 5643   Physical exam:  Vitals:   04/05/18 0335 04/05/18 0751 04/05/18 1205 04/05/18 1237  BP: 128/66 136/69 (!) 145/65 (!) 160/77  Pulse: 70 66 60 63  Resp: '17 14 14 20  ' Temp: 98.9 F (37.2 C) 98 F (36.7 C) 98.5 F (36.9 C) 98.6 F (37 C)  TempSrc: Oral Oral Oral Oral  SpO2: 98% 100% 100% 100%  Weight: 166 lb 11.2 oz (75.6 kg)     Height:       Physical Exam  Constitutional: He is oriented to person, place, and time.  Elderly gentleman in no acute distress  HENT:  Head: Normocephalic and atraumatic.  Sutured scalp laceration right forehead  Eyes: Pupils are equal, round, and reactive to light. EOM are normal.  Neck: Normal range of motion.  Cardiovascular: Normal rate, regular rhythm and normal heart sounds.  Pulmonary/Chest: Effort normal and breath sounds normal.  Abdominal: Soft. Bowel sounds are normal.  Neurological: He is alert and oriented to person, place, and time.  Skin: Skin is warm and dry.       CMP Latest Ref Rng & Units 04/04/2018  Glucose 65 - 99 mg/dL 113(H)  BUN 6 - 20 mg/dL 24(H)  Creatinine 0.61 - 1.24 mg/dL 1.46(H)  Sodium 135 - 145 mmol/L 135  Potassium 3.5 - 5.1 mmol/L 4.1  Chloride 101 - 111 mmol/L 105  CO2 22 - 32 mmol/L 26  Calcium 8.9 - 10.3 mg/dL 8.9  Total Protein 6.5 - 8.1 g/dL -  Total Bilirubin 0.3 - 1.2 mg/dL -  Alkaline Phos 38 - 126 U/L -  AST 15 - 41 U/L -  ALT 17 - 63 U/L -   CBC Latest Ref Rng & Units 04/05/2018  WBC 3.8 - 10.6 K/uL 11.2(H)  Hemoglobin 13.0 - 18.0 g/dL  6.7(L)  Hematocrit 40.0 - 52.0 % 20.3(L)  Platelets 150 - 440 K/uL 179    '@IMAGES' @  Dg Chest 2 View  Result Date:  04/03/2018 CLINICAL DATA:  Shortness of breath today.  Ex-smoker. EXAM: CHEST - 2 VIEW COMPARISON:  04/23/2017. FINDINGS: Borderline enlarged cardiac silhouette with a mild interval increase in size. Stable left subclavian pacemaker leads. Clear lungs with normal vascularity. The lungs remain hyperexpanded. Thoracic spine degenerative changes. IMPRESSION: No acute abnormality.  Stable mild changes of COPD. Electronically Signed   By: Claudie Revering M.D.   On: 04/03/2018 13:51   Ct Head Wo Contrast  Result Date: 04/01/2018 CLINICAL DATA:  Mechanical fall, hitting head on ground. EXAM: CT HEAD WITHOUT CONTRAST TECHNIQUE: Contiguous axial images were obtained from the base of the skull through the vertex without intravenous contrast. COMPARISON:  10/22/2006. FINDINGS: Brain: No evidence for acute infarction, hemorrhage, mass lesion, or extra-axial fluid. Ventricular prominence, consistent with hydrocephalus ex vacuo. Hypoattenuation of white matter, likely small vessel disease. Vascular: Calcification of the cavernous internal carotid arteries consistent with cerebrovascular atherosclerotic disease. No signs of intracranial large vessel occlusion. Skull: No skull fracture. Soft tissue swelling and hematoma over the RIGHT frontal region, with laceration. Sinuses/Orbits: Mild chronic sinus disease.  Negative orbits. Other: None. Compared with priors, there has been mild progressive brain substance loss. IMPRESSION: Atrophy and small vessel disease.  No acute intracranial findings. No skull fracture or intracranial hemorrhage. RIGHT frontal scalp hematoma. Electronically Signed   By: Staci Righter M.D.   On: 04/01/2018 18:24    Assessment and plan- Patient is a 84 y.o. male admitted for mechanical fall found to have acute on chronic normocytic anemia  Iron studies were checked after blood transfusion and are difficult to interpret in this setting. He certainly has a component of anemia of chronic disease and  possibly anemia of chronic kidney disease. Baseline hb runs around 10. Now acutely low around 7. Anemia work up so far reveals possible iron deficiency and anemia of chronic disease. His B12 level is also low normal.   1. Given his age I would not want to do a bone marrow biopsy right away. Would recommend starting and discharging him on PO b12 1000 mcg daily  2. I will give him 2 doses of feraheme before giving procrit as he has a better likelihood of responding to procrit if ferritin >200 or iron >20  3. After IV iron, I will give give him procrit to keep his hb between 10-11. If his anemia is due to CKD, he should hopefully respond to procrit  4. If he does not respond to iron, b12 and procrit- I will consider doing a bone marrow biopsy to r/o other etiologies like MDS  5. I will complete his anemia work up today to r/o hemolysis- LDH, haptoglobin, coombs test. Also check for free light chains and myeloma panel, EPO and hepatic function panel  I will f/u with him as an outpatient later this week   Thank you for this kind referral and the opportunity to participate in the care of this  Patient   Visit Diagnosis 1. Acute blood loss anemia   2. Scalp laceration, initial encounter   3. Shortness of breath     Dr. Randa Evens, MD, MPH [facility] [phone] 04/05/2018  5:02 PM

## 2018-04-05 NOTE — Progress Notes (Signed)
Blood transfusion completed. No S&S of a reaction.  Post transfusion VS stable

## 2018-04-05 NOTE — Progress Notes (Signed)
PRBC transfusion started

## 2018-04-05 NOTE — Progress Notes (Signed)
Hbg this a.m. Is 6.7, prime paged.

## 2018-04-05 NOTE — Progress Notes (Signed)
[place] [facility] [place] NAME: Devin Washington    MR#:  [id number]  DATE OF BIRTH:  [birth year]  SUBJECTIVE:  CHIEF COMPLAINT: Patient is resting comfortably, had EGD and colonoscopy yesterday.  Tolerated procedures well.  Hemoglobin is at 6.7 today, denies any active bleeding or chest pain.  Seen by nephrology in the past regarding his chronic kidney disease Patient lives independently and still drives at  baseline  REVIEW OF SYSTEMS:  CONSTITUTIONAL: No fever, fatigue or weakness.  EYES: No blurred or double vision.  EARS, NOSE, AND THROAT: No tinnitus or ear pain.  RESPIRATORY: No cough, shortness of breath, wheezing or hemoptysis.  CARDIOVASCULAR: No chest pain, orthopnea, edema.  GASTROINTESTINAL: No nausea, vomiting, diarrhea or abdominal pain.  GENITOURINARY: No dysuria, hematuria.  ENDOCRINE: No polyuria, nocturia,  HEMATOLOGY: No anemia, easy bruising or bleeding SKIN: No rash or lesion. MUSCULOSKELETAL: No joint pain or arthritis.   NEUROLOGIC: No tingling, numbness, weakness.  PSYCHIATRY: No anxiety or depression.   DRUG ALLERGIES:  No Known Allergies  VITALS:  Blood pressure 136/69, pulse 66, temperature 98 F (36.7 C), temperature source Oral, resp. rate 14, height 5\' 10"  (1.778 m), weight 75.6 kg (166 lb 11.2 oz), SpO2 100 %.  PHYSICAL EXAMINATION:  GENERAL:  84 y.o.-year-old patient lying in the bed with no acute distress.  EYES: Pupils equal, round, reactive to light and accommodation. No scleral icterus. Extraocular muscles intact.  HEENT: Head atraumatic, normocephalic. Oropharynx and nasopharynx clear.  Left scalp laceration status post suturing NECK:  Supple, no jugular venous distention. No thyroid enlargement, no tenderness.  LUNGS: Normal breath sounds bilaterally, no wheezing, rales,rhonchi or crepitation. No use of accessory muscles of respiration.  CARDIOVASCULAR:   irregularly regular ,no murmurs, rubs, or  gallops.  ABDOMEN: Soft, nontender, nondistended. Bowel sounds present. EXTREMITIES: No pedal edema, cyanosis, or clubbing.  NEUROLOGIC: Cranial nerves II through XII are intact. Muscle strength at his baseline in all extremities. Sensation intact. Gait not checked.  PSYCHIATRIC: The patient is alert and oriented x 3.  SKIN: No obvious rash, lesion, or ulcer.    LABORATORY PANEL:   CBC Recent Labs  Lab 04/05/18 0504  WBC 11.2*  HGB 6.7*  HCT 20.3*  PLT 179   ------------------------------------------------------------------------------------------------------------------  Chemistries  Recent Labs  Lab 04/04/18 0151  NA 135  K 4.1  CL 105  CO2 26  GLUCOSE 113*  BUN 24*  CREATININE 1.46*  CALCIUM 8.9   ------------------------------------------------------------------------------------------------------------------  Cardiac Enzymes No results for input(s): TROPONINI in the last 168 hours. ------------------------------------------------------------------------------------------------------------------  RADIOLOGY:  Dg Chest 2 View  Result Date: 04/03/2018 CLINICAL DATA:  Shortness of breath today.  Ex-smoker. EXAM: CHEST - 2 VIEW COMPARISON:  04/23/2017. FINDINGS: Borderline enlarged cardiac silhouette with a mild interval increase in size. Stable left subclavian pacemaker leads. Clear lungs with normal vascularity. The lungs remain hyperexpanded. Thoracic spine degenerative changes. IMPRESSION: No acute abnormality.  Stable mild changes of COPD. Electronically Signed   By: Claudie Revering M.D.   On: 04/03/2018 13:51    EKG:   Orders placed or performed during the hospital encounter of 04/01/18  . EKG 12-Lead  . EKG 12-Lead  . EKG 12-Lead  . EKG 12-Lead    ASSESSMENT AND PLAN:   This is a 84 year old male admitted for symptomatic anemia.   # Symptomatic anemia with history of chronic kidney disease : With history of H. pylori induced peptic ulcer disease in a.m.  2016 The patient  is received 1 unit of packed red blood cells in the emergency department.  Transfuse 1 more unit today Hemoglobin 8.2 after transfusion-dropped down to 7.8 --6.7 today Patient had EGD and colonoscopy yesterday, gastric biopsies were taken and polyp was removed.  Large internal hemorrhoids with no stigmata of recent bleeding noticed.  Dr. Vicente Males will reassess the patient today and might consider capsule study Consult placed to nephrology and hematology Folate and B12 are in normal range Iron at 27, TIBC 229 Given IV iron Discontinued  Plavix per cardiology recommendations Aspirin resumed yesterday after EGD and colonoscopy by GI   #Atrial fibrillation with RVR with minimal exertion Rate controlled Coreg changed to metoprolol 25 mg p.o. twice daily Provided gentle hydration with IV fluids Status post 1 unit of blood transfusion, will provide 1 more unit of blood today  #Orthostatic hypotension status post IV fluids Patient is a symptomatic Creatinine 1.9-1.63--1.46  #.  Hypertension: Reasonable controlled for age.  Continue benazepril.  Coreg changed to  Metoprolol  #.  CHF: Chronicsystolic with ejection fraction 35%  Continue furosemide.  #.  Fall: Appears mechanical.    Cardiology has evaluated the patient no new recommendations and agreeable with changing Coreg to metoprolol    Patient prefers going home DVT plexus with  SCDs  PT recommend home health PT with supervision and rolling walker with 5 inch wheels    All the records are reviewed and case discussed with Care Management/Social Workerr. Management plans discussed with the patient, family and they are in agreement.  CODE STATUS: dnr  TOTAL TIME TAKING CARE OF THIS PATIENT: 35  minutes.   POSSIBLE D/C IN 1 DAYS, DEPENDING ON CLINICAL CONDITION.  Note: This dictation was prepared with Dragon dictation along with smaller phrase technology. Any transcriptional errors that result from this process are  unintentional.   Nicholes Mango M.D on 04/05/2018 at 9:59 AM  Between 7am to 6pm - Pager - [phone] After 6pm go to [email or web address] - password [email or web address] [facility]  [place] [facility]  Office  [phone]  CC: Primary care physician; Albina Billet, MD

## 2018-04-05 NOTE — Progress Notes (Signed)
[facility]  ROUNDING NOTE   Subjective:   Mr. MAKHI MUZQUIZ admitted to [facility] on 04/01/2018 for Acute blood loss anemia [D62] Scalp laceration, initial encounter [S01.01XA]  Patient underwent endoscopy with no source of bleed. Hemoglobin down to 6.7 today. PRBC transfusion ordered for today. Second for this admission.   Objective:  Vital signs in last 24 hours:  Temp:  [98 F (36.7 C)-99.7 F (37.6 C)] 98.6 F (37 C) (04/22 1237) Pulse Rate:  [60-83] 63 (04/22 1237) Resp:  [14-20] 20 (04/22 1237) BP: (120-161)/(53-77) 160/77 (04/22 1237) SpO2:  [98 %-100 %] 100 % (04/22 1237) Weight:  [75.6 kg (166 lb 11.2 oz)] 75.6 kg (166 lb 11.2 oz) (04/22 0335)  Weight change: -0.408 kg (-14.4 oz) Filed Weights   04/03/18 0419 04/04/18 0500 04/05/18 0335  Weight: 75.9 kg (167 lb 6.4 oz) 76 kg (167 lb 9.6 oz) 75.6 kg (166 lb 11.2 oz)    Intake/Output: I/O last 3 completed shifts: In: 660 [I.V.:500; IV Piggyback:160] Out: 1829 [Urine:1725]   Intake/Output this shift:  Total I/O In: 870 [P.O.:480; I.V.:10; Blood:380] Out: 400 [Urine:400]  Physical Exam: General: NAD, laying in bed  Head: Normocephalic, atraumatic. Moist oral mucosal membranes  Eyes: Anicteric, PERRL  Neck: Supple, trachea midline  Lungs:  Clear to auscultation  Heart: Regular rate and rhythm  Abdomen:  Soft, nontender,   Extremities: no peripheral edema.  Neurologic: Nonfocal, moving all four extremities  Skin: No lesions       Basic Metabolic Panel: Recent Labs  Lab 04/01/18 2134 04/03/18 0520 04/04/18 0151  NA 136 140 135  K 4.2 4.2 4.1  CL 104 109 105  CO2 25 29 26   GLUCOSE 197* 97 113*  BUN 27* 24* 24*  CREATININE 1.90* 1.63* 1.46*  CALCIUM 8.8* 8.7* 8.9    Liver Function Tests: No results for input(s): AST, ALT, ALKPHOS, BILITOT, PROT, ALBUMIN in the last 168 hours. No results for input(s): LIPASE, AMYLASE in the last 168 hours. No results for input(s): AMMONIA in the last  168 hours.  CBC: Recent Labs  Lab 04/01/18 1817 04/02/18 0821 04/03/18 0520 04/03/18 1435 04/03/18 1936 04/04/18 0151 04/05/18 0504  WBC 12.1* 10.6  --   --   --  14.7* 11.2*  NEUTROABS 9.8*  --   --   --   --   --   --   HGB 7.1* 8.2* 7.4* 8.2* 8.2* 7.8* 6.7*  HCT 22.2* 24.8* 22.7* 24.7* 25.4* 24.0* 20.3*  MCV 80.1 81.2  --   --   --  81.3 81.5  PLT 268 192  --   --   --  205 179    Cardiac Enzymes: No results for input(s): CKTOTAL, CKMB, CKMBINDEX, TROPONINI in the last 168 hours.  BNP: Invalid input(s): POCBNP  CBG: No results for input(s): GLUCAP in the last 168 hours.  Microbiology: Results for orders placed or performed during the hospital encounter of 11/27/17  Surgical pcr screen     Status: None   Collection Time: 11/27/17  9:39 AM  Result Value Ref Range Status   MRSA, PCR NEGATIVE NEGATIVE Final   Staphylococcus aureus NEGATIVE NEGATIVE Final    Comment: (NOTE) The Xpert SA Assay (FDA approved for NASAL specimens in patients 18 years of age and older), is one component of a comprehensive surveillance program. It is not intended to diagnose infection nor to guide or monitor treatment.     Coagulation Studies: No results for input(s): LABPROT, INR  in the last 72 hours.  Urinalysis: No results for input(s): COLORURINE, LABSPEC, PHURINE, GLUCOSEU, HGBUR, BILIRUBINUR, KETONESUR, PROTEINUR, UROBILINOGEN, NITRITE, LEUKOCYTESUR in the last 72 hours.  Invalid input(s): APPERANCEUR    Imaging: No results found.   Medications:   . sodium chloride     . aspirin EC  81 mg Oral Daily  . benazepril  40 mg Oral Daily  . docusate sodium  100 mg Oral BID  . feeding supplement (ENSURE ENLIVE)  237 mL Oral BID BM  . furosemide  20 mg Oral Daily  . metoprolol tartrate  25 mg Oral BID  . pantoprazole  40 mg Oral BID  . simvastatin  40 mg Oral Daily  . sodium chloride flush  3 mL Intravenous Q12H  . umeclidinium-vilanterol  1 puff Inhalation BID    acetaminophen **OR** acetaminophen, metoprolol tartrate, ondansetron **OR** ondansetron (ZOFRAN) IV, traMADol, traZODone  Assessment/ Plan:  Mr. ALFERD OBRYANT is a 84 y.o. black male with hypertension, hyperlipidemia, anemia, atrial fibrillation, peptic ulcer disease, congestive heart failure, sick sinus syndrome status post pacemaker, osteoarthritis   1. Acute renal failure on Chronic Kidney Disease stage III with proteinuria: CKD secondary to hypertension and NSAIDs. Baseline creatinine of 1.59, GFR of 43 on 03/01/18.  Acute renal failure from blood loss and hypotension.  - Continue benazepril for renal protection - Creatinine back to baseline.   2. Hypertension: blood pressure  - Home regimen of benazepril, furosemide, and carvedilol  3. Acute Anemia on anemia of chronic kidney disease: with history of peptic ulcer disease from NSAIDs. Iron studies are low.  Status post PRBC transfusion on 4/22, 4/18 Appreciate GI input  4. Secondary Hyperparathyroidism with history of hypercalcemia: Not currently on a vitamin D agent.    LOS: 0 Jenya Putz 4/22/20192:47 PM

## 2018-04-05 NOTE — Plan of Care (Signed)
Discharge instructions and follow up explained to patient and grandaughter, included med changes

## 2018-04-05 NOTE — Progress Notes (Signed)
Jonathon Bellows , MD [address] [place], [place], [place], [address] [address] [place], [place], [place], [address] Phone: [phone]  Fax: [phone]   Devin Washington is being followed for anemia   Subjective: Noted a drop in Hb today - no bowel movements , denies blood loss overtly.     Objective: Vital signs in last 24 hours: Vitals:   04/04/18 1746 04/04/18 1938 04/05/18 0335 04/05/18 0751  BP: (!) 161/73 132/64 128/66 136/69  Pulse: 66 83 70 66  Resp:  19 17 14   Temp: 98.2 F (36.8 C) 98.7 F (37.1 C) 98.9 F (37.2 C) 98 F (36.7 C)  TempSrc: Oral Oral Oral Oral  SpO2: 100% 100% 98% 100%  Weight:   166 lb 11.2 oz (75.6 kg)   Height:       Weight change: -14.4 oz (-0.408 kg)  Intake/Output Summary (Last 24 hours) at 04/05/2018 1015 Last data filed at 04/05/2018 0941 Gross per 24 hour  Intake 900 ml  Output 900 ml  Net 0 ml     Exam: Heart:: Regular rate and rhythm, S1S2 present or without murmur or extra heart sounds Lungs: normal, clear to auscultation and clear to auscultation and percussion Abdomen: soft, nontender, normal bowel sounds   Lab Results: @LABTEST2 @ Micro Results: No results found for this or any previous visit (from the past 240 hour(s)). Studies/Results: Dg Chest 2 View  Result Date: 04/03/2018 CLINICAL DATA:  Shortness of breath today.  Ex-smoker. EXAM: CHEST - 2 VIEW COMPARISON:  04/23/2017. FINDINGS: Borderline enlarged cardiac silhouette with a mild interval increase in size. Stable left subclavian pacemaker leads. Clear lungs with normal vascularity. The lungs remain hyperexpanded. Thoracic spine degenerative changes. IMPRESSION: No acute abnormality.  Stable mild changes of COPD. Electronically Signed   By: Claudie Revering M.D.   On: 04/03/2018 13:51   Medications: I have reviewed the patient's current medications. Scheduled Meds: . aspirin EC  81 mg Oral Daily  . benazepril  40 mg Oral Daily  . docusate sodium  100 mg  Oral BID  . feeding supplement (ENSURE ENLIVE)  237 mL Oral BID BM  . furosemide  20 mg Oral Daily  . metoprolol tartrate  25 mg Oral BID  . pantoprazole  40 mg Oral BID  . simvastatin  40 mg Oral Daily  . sodium chloride flush  3 mL Intravenous Q12H  . umeclidinium-vilanterol  1 puff Inhalation BID   Continuous Infusions: . sodium chloride    . sodium chloride     PRN Meds:.acetaminophen **OR** acetaminophen, metoprolol tartrate, ondansetron **OR** ondansetron (ZOFRAN) IV, traMADol, traZODone   Assessment: Active Problems:   Symptomatic anemia   Acute blood loss anemia  Devin Washington 84 y.o. male admitted on 04/01/18 with anemia and symptomatic.Hb on admission was 7.1 grams , on asprin and plavix for CAD/afibb. He has CKD. Iron studies do not clearly indicate an anemia of iron deficiency most likely anemia of chronic disease with possible slightly low iron. B12 Low normal .EGD appeared normal .Colonocopy showed s mall polyp and large internal hemorrhoids. I have been reconsulted to see her today as Hb dropped from 7.8 grams to 6.7 grams today   Plan:  1. With no overt bleeding -recheck Hb  and if still low and dropped since yesterday then will proceed with capsule study . If Hb is same as yesterday then needs no further evaluation .   2. Hematology seeing the patient and can decide if worth  giving some IV iron empirically. ?replace b12 . Iron levels checked after transfusion and may be falsely normal    LOS: 0 days   Jonathon Bellows, MD 04/05/2018, 10:15 AM

## 2018-04-06 LAB — TYPE AND SCREEN
ABO/RH(D): O POS
ANTIBODY SCREEN: NEGATIVE
Unit division: 0

## 2018-04-06 LAB — BPAM RBC
Blood Product Expiration Date: 201904262359
ISSUE DATE / TIME: 201904221203
Unit Type and Rh: 9500

## 2018-04-06 LAB — HAPTOGLOBIN: Haptoglobin: 167 mg/dL (ref 34–200)

## 2018-04-06 LAB — KAPPA/LAMBDA LIGHT CHAINS
KAPPA FREE LGHT CHN: 38.1 mg/L — AB (ref 3.3–19.4)
Kappa, lambda light chain ratio: 0.77 (ref 0.26–1.65)
Lambda free light chains: 49.5 mg/L — ABNORMAL HIGH (ref 5.7–26.3)

## 2018-04-06 LAB — ERYTHROPOIETIN: Erythropoietin: 22.3 m[IU]/mL — ABNORMAL HIGH (ref 2.6–18.5)

## 2018-04-06 MED ORDER — FERUMOXYTOL INJECTION 510 MG/17 ML
510.0000 mg | Freq: Once | INTRAVENOUS | Status: AC
  Administered 2018-04-06: 510 mg via INTRAVENOUS
  Filled 2018-04-06: qty 17

## 2018-04-06 MED ORDER — ACETAMINOPHEN 325 MG PO TABS: 650.0000 mg | ORAL_TABLET | Freq: Four times a day (QID) | ORAL | Status: DC | PRN

## 2018-04-06 MED ORDER — TRAMADOL HCL 50 MG PO TABS: 50.0000 mg | ORAL_TABLET | Freq: Four times a day (QID) | ORAL | 0 refills | Status: DC | PRN

## 2018-04-06 MED ORDER — FERROUS SULFATE 325 (65 FE) MG PO TABS: 325.0000 mg | ORAL_TABLET | Freq: Two times a day (BID) | ORAL | 3 refills | Status: DC

## 2018-04-06 MED ORDER — FERROUS SULFATE 325 (65 FE) MG PO TABS
325.0000 mg | ORAL_TABLET | Freq: Two times a day (BID) | ORAL | Status: DC
  Administered 2018-04-06: 325 mg via ORAL
  Filled 2018-04-06: qty 1

## 2018-04-06 MED ORDER — ENSURE ENLIVE PO LIQD: 237.0000 mL | Freq: Two times a day (BID) | ORAL | 1 refills | Status: DC

## 2018-04-06 MED ORDER — CYANOCOBALAMIN 1000 MCG PO TABS: 1000.0000 ug | ORAL_TABLET | Freq: Every day | ORAL | 1 refills | Status: DC

## 2018-04-06 MED ORDER — BENAZEPRIL HCL 20 MG PO TABS: 20.0000 mg | ORAL_TABLET | Freq: Every day | ORAL | 0 refills | Status: DC

## 2018-04-06 MED ORDER — PANTOPRAZOLE SODIUM 40 MG PO TBEC: 40.0000 mg | DELAYED_RELEASE_TABLET | Freq: Every day | ORAL | 0 refills | Status: DC

## 2018-04-06 MED ORDER — DOCUSATE SODIUM 100 MG PO CAPS: 100.0000 mg | ORAL_CAPSULE | Freq: Every day | ORAL | 0 refills | Status: DC

## 2018-04-06 MED ORDER — METOPROLOL TARTRATE 25 MG PO TABS: 25.0000 mg | ORAL_TABLET | Freq: Two times a day (BID) | ORAL | 0 refills | Status: DC

## 2018-04-06 MED ORDER — VITAMIN B-12 1000 MCG PO TABS
1000.0000 ug | ORAL_TABLET | Freq: Every day | ORAL | Status: DC
  Administered 2018-04-06: 1000 ug via ORAL
  Filled 2018-04-06: qty 1

## 2018-04-06 NOTE — Discharge Summary (Signed)
[facility] [place] NAME: Devin Washington    MR#:  [id number]  DATE OF BIRTH:  1923/08/15  DATE OF ADMISSION:  04/01/2018 ADMITTING PHYSICIAN: Harrie Foreman, MD  DATE OF DISCHARGE: 04/06/18 PRIMARY CARE PHYSICIAN: Albina Billet, MD    ADMISSION DIAGNOSIS:  Acute blood loss anemia [D62] Scalp laceration, initial encounter [S01.01XA]  DISCHARGE DIAGNOSIS:   Symptomatic anemia Acute renal failure on chronic kidney disease stage III with proteinuria  SECONDARY DIAGNOSIS:   Past Medical History:  Diagnosis Date  . A-fib ([place])   . Cardiomyopathy ([place])   . CHF (congestive heart failure) ([place])   . DJD (degenerative joint disease)   . Duodenal ulcer 08/09/2015  . Dysrhythmia   . Erosive esophagitis   . HH (hiatus hernia)   . HLD (hyperlipidemia)   . Hypertension   . PAD (peripheral artery disease) ([place])   . Peripheral vascular disease ([place])   . Presence of permanent cardiac pacemaker   . Prostate cancer [facility])    Prostatectomy.  . 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 Shortness of breath   . SSS (sick sinus syndrome) [facility])     HOSPITAL COURSE:    Symptomatic anemia with history of chronic kidney disease-3 : With history of H. pylori induced peptic ulcer disease in a.m. 2016 The patient is received 1 unit of packed red blood cells in the emergency department.  Transfuse 1 more unit today Hemoglobin 8.2 after transfusion-dropped down to 7.8 --6.7 today Patient had EGD and colonoscopy , gastric biopsies were taken and polyp was removed.  Large internal hemorrhoids with no stigmata of recent bleeding noticed.  Dr. Vicente Males is not considering capsule study as repeat hemoglobin is stable  Seen by nephrology and hematology Folate and B12 are in normal range Iron at 27, TIBC 229 Given IV iron, B12 supplement started, Discontinued  Plavix per cardiology recommendations Aspirin resumed yesterday after EGD and colonoscopy by GI Hematology  will consider doing bone marrow biopsy to rule out etiologies like MDS if patient does not respond to iron, B12 and Procrit  #Acute kidney injury with chronic kidney disease stage III Creatinine at baseline Outpatient follow-up with nephrology as recommended    #Atrial fibrillation with RVR with minimal exertion Rate controlled Coreg changed to metoprolol 25 mg p.o. twice daily Provided gentle hydration with IV fluids Status post 2 units of blood transfusion: 04/01/2018 and 04/05/2018  #Orthostatic hypotension status post IV fluids Patient is a symptomatic Creatinine 1.9-1.63--1.46  #. Hypertension: Reasonable controlled for age. Continue benazepril.  Coreg changed to  Metoprolol  #. CHF: Chronicsystolic with ejection fraction 35% Continue furosemide.  #. Fall: Appears mechanical.   Cardiology has evaluated the patient no new recommendations and agreeable with changing Coreg to metoprolol     DVT plexus with  SCDs  PT recommend home health PT with supervision and rolling walker with 5 inch wheels   DISCHARGE CONDITIONS:   Stable  CONSULTS OBTAINED:  Treatment Team:  Corey Skains, MD Lavonia Dana, MD Sindy Guadeloupe, MD   PROCEDURES EGD and colonoscopy and scalp laceration suturing  DRUG ALLERGIES:  No Known Allergies  DISCHARGE MEDICATIONS:   Allergies as of 04/06/2018   No Known Allergies     Medication List    STOP taking these medications   carvedilol 3.125 MG tablet Commonly known as:  COREG   clopidogrel 75 MG tablet Commonly known as:  PLAVIX     TAKE these medications  acetaminophen 325 MG tablet Commonly known as:  TYLENOL Take 2 tablets (650 mg total) by mouth every 6 (six) hours as needed for mild pain (or Fever >/= 101).   albuterol 108 (90 Base) MCG/ACT inhaler Commonly known as:  PROVENTIL HFA;VENTOLIN HFA Inhale 2 puffs into the lungs every 6 (six) hours as needed for wheezing or shortness of breath.   ANORO  ELLIPTA 62.5-25 MCG/INH Aepb Generic drug:  umeclidinium-vilanterol Inhale 1 puff into the lungs 2 (two) times daily.   aspirin 81 MG EC tablet Take 1 tablet (81 mg total) by mouth daily.   benazepril 20 MG tablet Commonly known as:  LOTENSIN Take 1 tablet (20 mg total) by mouth daily. What changed:    medication strength  how much to take   cyanocobalamin 1000 MCG tablet Take 1 tablet (1,000 mcg total) by mouth daily. Start taking on:  04/07/2018   docusate sodium 100 MG capsule Commonly known as:  COLACE Take 1 capsule (100 mg total) by mouth daily.   feeding supplement (ENSURE ENLIVE) Liqd Take 237 mLs by mouth 2 (two) times daily between meals. Start taking on:  04/07/2018   ferrous sulfate 325 (65 FE) MG tablet Take 1 tablet (325 mg total) by mouth 2 (two) times daily with a meal.   furosemide 20 MG tablet Commonly known as:  LASIX Take 1 tablet by mouth daily.   metoprolol tartrate 25 MG tablet Commonly known as:  LOPRESSOR Take 1 tablet (25 mg total) by mouth 2 (two) times daily.   pantoprazole 40 MG tablet Commonly known as:  PROTONIX Take 1 tablet (40 mg total) by mouth daily.   simvastatin 20 MG tablet Commonly known as:  ZOCOR Take 40 mg by mouth daily.   traMADol 50 MG tablet Commonly known as:  ULTRAM Take 1 tablet (50 mg total) by mouth every 6 (six) hours as needed for moderate pain or severe pain.   traZODone 50 MG tablet Commonly known as:  DESYREL Take 1 tablet by mouth at bedtime as needed for sleep.            Durable Medical Equipment  (From admission, onward)        Start     Ordered   04/04/18 1208  For home use only DME Walker rolling  Once    Comments:  Rolling walker with 5 inch wheels  Question:  Patient needs a walker to treat with the following condition  Answer:  Weak   04/04/18 1208       DISCHARGE INSTRUCTIONS:  Follow-up with primary care physician in 5 days for suture removal  follow-up with hematology Dr. Janese Banks  on Friday Follow-up with gastroenterology Dr. Vicente Males 3 weeks Follow-up with nephrology Dr. Juleen China in 2 weeks Op chf clinic    DIET:  Cardiac diet  DISCHARGE CONDITION:  Fair  ACTIVITY:  Activity as tolerated  OXYGEN:  Home Oxygen: No.   Oxygen Delivery: room air  DISCHARGE LOCATION:  home   If you experience worsening of your admission symptoms, develop shortness of breath, life threatening emergency, suicidal or homicidal thoughts you must seek medical attention immediately by calling 911 or calling your MD immediately  if symptoms less severe.  You Must read complete instructions/literature along with all the possible adverse reactions/side effects for all the Medicines you take and that have been prescribed to you. Take any new Medicines after you have completely understood and accpet all the possible adverse reactions/side effects.   Please note  You were cared for by a hospitalist during your hospital stay. If you have any questions about your discharge medications or the care you received while you were in the hospital after you are discharged, you can call the unit and asked to speak with the hospitalist on call if the hospitalist that took care of you is not available. Once you are discharged, your primary care physician will handle any further medical issues. Please note that NO REFILLS for any discharge medications will be authorized once you are discharged, as it is imperative that you return to your primary care physician (or establish a relationship with a primary care physician if you do not have one) for your aftercare needs so that they can reassess your need for medications and monitor your lab values.     Today  Chief Complaint  Patient presents with  . Fall   Patient denies any active bleeding.  Okay to discharge patient from  nephrology, hematology and GI standpoint  ROS:  CONSTITUTIONAL: Denies fevers, chills. Denies any fatigue, weakness.  EYES: Denies  blurry vision, double vision, eye pain. EARS, NOSE, THROAT: Denies tinnitus, ear pain, hearing loss. RESPIRATORY: Denies cough, wheeze, shortness of breath.  CARDIOVASCULAR: Denies chest pain, palpitations, edema.  GASTROINTESTINAL: Denies nausea, vomiting, diarrhea, abdominal pain. Denies bright red blood per rectum. GENITOURINARY: Denies dysuria, hematuria. ENDOCRINE: Denies nocturia or thyroid problems. HEMATOLOGIC AND LYMPHATIC: Denies easy bruising or bleeding. SKIN: Denies rash or lesion. MUSCULOSKELETAL: Denies pain in neck, back, shoulder, knees, hips or arthritic symptoms.  NEUROLOGIC: Denies paralysis, paresthesias.  PSYCHIATRIC: Denies anxiety or depressive symptoms.   VITAL SIGNS:  Blood pressure (!) 159/62, pulse 73, temperature 98.9 F (37.2 C), temperature source Oral, resp. rate 18, height '5\' 10"'  (1.778 m), weight 76.5 kg (168 lb 9.6 oz), SpO2 99 %.  I/O:    Intake/Output Summary (Last 24 hours) at 04/06/2018 1445 Last data filed at 04/06/2018 1406 Gross per 24 hour  Intake 1460 ml  Output 1250 ml  Net 210 ml    PHYSICAL EXAMINATION:  GENERAL:  84 y.o.-year-old patient lying in the bed with no acute distress.  EYES: Pupils equal, round, reactive to light and accommodation. No scleral icterus. Extraocular muscles intact.  HEENT: Head atraumatic, normocephalic. Oropharynx and nasopharynx clear.  NECK:  Supple, no jugular venous distention. No thyroid enlargement, no tenderness.  LUNGS: Normal breath sounds bilaterally, no wheezing, rales,rhonchi or crepitation. No use of accessory muscles of respiration.  CARDIOVASCULAR: S1, S2 normal. No murmurs, rubs, or gallops.  ABDOMEN: Soft, non-tender, non-distended. Bowel sounds present. No organomegaly or mass.  EXTREMITIES: No pedal edema, cyanosis, or clubbing.  NEUROLOGIC: Cranial nerves II through XII are intact. Muscle strength global weakness in all extremities. Sensation intact. Gait not checked.  PSYCHIATRIC: The  patient is alert and oriented x 3.  SKIN: No obvious rash, lesion, or ulcer.   DATA REVIEW:   CBC Recent Labs  Lab 04/05/18 0504 04/05/18 1557  WBC 11.2*  --   HGB 6.7* 8.8*  HCT 20.3* 26.3*  PLT 179  --     Chemistries  Recent Labs  Lab 04/04/18 0151 04/05/18 1557  NA 135  --   K 4.1  --   CL 105  --   CO2 26  --   GLUCOSE 113*  --   BUN 24*  --   CREATININE 1.46*  --   CALCIUM 8.9  --   AST  --  29  ALT  --  19  ALKPHOS  --  50  BILITOT  --  1.1    Cardiac Enzymes No results for input(s): TROPONINI in the last 168 hours.  Microbiology Results  Results for orders placed or performed during the hospital encounter of 11/27/17  Surgical pcr screen     Status: None   Collection Time: 11/27/17  9:39 AM  Result Value Ref Range Status   MRSA, PCR NEGATIVE NEGATIVE Final   Staphylococcus aureus NEGATIVE NEGATIVE Final    Comment: (NOTE) The Xpert SA Assay (FDA approved for NASAL specimens in patients 60 years of age and older), is one component of a comprehensive surveillance program. It is not intended to diagnose infection nor to guide or monitor treatment.     RADIOLOGY:  Dg Chest 2 View  Result Date: 04/03/2018 CLINICAL DATA:  Shortness of breath today.  Ex-smoker. EXAM: CHEST - 2 VIEW COMPARISON:  04/23/2017. FINDINGS: Borderline enlarged cardiac silhouette with a mild interval increase in size. Stable left subclavian pacemaker leads. Clear lungs with normal vascularity. The lungs remain hyperexpanded. Thoracic spine degenerative changes. IMPRESSION: No acute abnormality.  Stable mild changes of COPD. Electronically Signed   By: Claudie Revering M.D.   On: 04/03/2018 13:51    EKG:   Orders placed or performed during the hospital encounter of 04/01/18  . EKG 12-Lead  . EKG 12-Lead  . EKG 12-Lead  . EKG 12-Lead      Management plans discussed with the patient, family and they are in agreement.  CODE STATUS:     Code Status Orders  (From admission,  onward)        Start     Ordered   04/02/18 1532  Do not attempt resuscitation (DNR)  Continuous    Question Answer Comment  In the event of cardiac or respiratory ARREST Do not call a "code blue"   In the event of cardiac or respiratory ARREST Do not perform Intubation, CPR, defibrillation or ACLS   In the event of cardiac or respiratory ARREST Use medication by any route, position, wound care, and other measures to relive pain and suffering. May use oxygen, suction and manual treatment of airway obstruction as needed for comfort.   Comments RN may pronounce      04/02/18 1532    Code Status History    Date Active Date Inactive Code Status Order ID Comments User Context   04/02/2018 0353 04/02/2018 1531 Full Code [id number]  Harrie Foreman, MD Inpatient   03/04/2018 1221 03/04/2018 1859 Full Code [id number]  Algernon Huxley, MD Inpatient   12/16/2017 1457 12/21/2017 1906 Full Code [id number]  Algernon Huxley, MD Inpatient   11/30/2017 1022 11/30/2017 1714 Full Code [id number]  Algernon Huxley, MD Inpatient   11/16/2017 1209 11/16/2017 1857 Full Code [id number]  Algernon Huxley, MD Inpatient   08/07/2015 0010 08/09/2015 2030 Full Code [id number]  Lance Coon, MD Inpatient    Advance Directive Documentation     Most Recent Value  Type of Advance Directive  Healthcare Power of Attorney  Pre-existing out of facility DNR order (yellow form or pink MOST form)  -  "MOST" Form in Place?  -      TOTAL TIME TAKING CARE OF THIS PATIENT: 43  minutes.   Note: This dictation was prepared with Dragon dictation along with smaller phrase technology. Any transcriptional errors that result from this process are unintentional.   '@MEC' @  on 04/06/2018 at 2:45 PM  Between 7am to 6pm - Pager -  [phone]  After 6pm go to [email or web address] - password EPAS [facility]  [facility]  Office  [phone]  CC: Primary care physician; Albina Billet, MD

## 2018-04-06 NOTE — Progress Notes (Signed)
Patient is discharge home in a stable condition, summary and f/u care given to both patient and niece , verbalized understanding , left in a wheel chair .

## 2018-04-06 NOTE — Care Management (Addendum)
Patient for discharge home today.  orders for RN and PT home health.  Agency preference is [facility].  Referral called and accepted.  Agency will assess patient within 24 hours of discharge.Patient has a rolling walker at home.

## 2018-04-06 NOTE — Progress Notes (Signed)
[facility]  ROUNDING NOTE   Subjective:   Patient has no complaints.   Objective:  Vital signs in last 24 hours:  Temp:  [98.5 F (36.9 C)-100.3 F (37.9 C)] 98.9 F (37.2 C) (04/23 0922) Pulse Rate:  [60-86] 73 (04/23 0922) Resp:  [14-20] 18 (04/23 0922) BP: (141-160)/(62-77) 159/62 (04/23 0922) SpO2:  [97 %-100 %] 99 % (04/23 0922) Weight:  [76.5 kg (168 lb 9.6 oz)] 76.5 kg (168 lb 9.6 oz) (04/23 0355)  Weight change: 0.862 kg (1 lb 14.4 oz) Filed Weights   04/04/18 0500 04/05/18 0335 04/06/18 0355  Weight: 76 kg (167 lb 9.6 oz) 75.6 kg (166 lb 11.2 oz) 76.5 kg (168 lb 9.6 oz)    Intake/Output: I/O last 3 completed shifts: In: 4270 [P.O.:840; I.V.:10; Blood:760] Out: 1200 [Urine:1200]   Intake/Output this shift:  Total I/O In: 480 [P.O.:480] Out: 550 [Urine:550]  Physical Exam: General: NAD, laying in bed  Head: Normocephalic, atraumatic. Moist oral mucosal membranes  Eyes: Anicteric, PERRL  Neck: Supple, trachea midline  Lungs:  Clear to auscultation  Heart: Regular rate and rhythm  Abdomen:  Soft, nontender,   Extremities: no peripheral edema.  Neurologic: Nonfocal, moving all four extremities  Skin: No lesions       Basic Metabolic Panel: Recent Labs  Lab 04/01/18 2134 04/03/18 0520 04/04/18 0151  NA 136 140 135  K 4.2 4.2 4.1  CL 104 109 105  CO2 25 29 26   GLUCOSE 197* 97 113*  BUN 27* 24* 24*  CREATININE 1.90* 1.63* 1.46*  CALCIUM 8.8* 8.7* 8.9    Liver Function Tests: Recent Labs  Lab 04/05/18 1557  AST 29  ALT 19  ALKPHOS 50  BILITOT 1.1  PROT 5.8*  ALBUMIN 2.9*   No results for input(s): LIPASE, AMYLASE in the last 168 hours. No results for input(s): AMMONIA in the last 168 hours.  CBC: Recent Labs  Lab 04/01/18 1817 04/02/18 0821  04/03/18 1435 04/03/18 1936 04/04/18 0151 04/05/18 0504 04/05/18 1557  WBC 12.1* 10.6  --   --   --  14.7* 11.2*  --   NEUTROABS 9.8*  --   --   --   --   --   --   --   HGB  7.1* 8.2*   < > 8.2* 8.2* 7.8* 6.7* 8.8*  HCT 22.2* 24.8*   < > 24.7* 25.4* 24.0* 20.3* 26.3*  MCV 80.1 81.2  --   --   --  81.3 81.5  --   PLT 268 192  --   --   --  205 179  --    < > = values in this interval not displayed.    Cardiac Enzymes: No results for input(s): CKTOTAL, CKMB, CKMBINDEX, TROPONINI in the last 168 hours.  BNP: Invalid input(s): POCBNP  CBG: No results for input(s): GLUCAP in the last 168 hours.  Microbiology: Results for orders placed or performed during the hospital encounter of 11/27/17  Surgical pcr screen     Status: None   Collection Time: 11/27/17  9:39 AM  Result Value Ref Range Status   MRSA, PCR NEGATIVE NEGATIVE Final   Staphylococcus aureus NEGATIVE NEGATIVE Final    Comment: (NOTE) The Xpert SA Assay (FDA approved for NASAL specimens in patients 41 years of age and older), is one component of a comprehensive surveillance program. It is not intended to diagnose infection nor to guide or monitor treatment.     Coagulation Studies: No results  for input(s): LABPROT, INR in the last 72 hours.  Urinalysis: No results for input(s): COLORURINE, LABSPEC, PHURINE, GLUCOSEU, HGBUR, BILIRUBINUR, KETONESUR, PROTEINUR, UROBILINOGEN, NITRITE, LEUKOCYTESUR in the last 72 hours.  Invalid input(s): APPERANCEUR    Imaging: No results found.   Medications:   . sodium chloride     . aspirin EC  81 mg Oral Daily  . benazepril  40 mg Oral Daily  . docusate sodium  100 mg Oral BID  . feeding supplement (ENSURE ENLIVE)  237 mL Oral BID BM  . furosemide  20 mg Oral Daily  . metoprolol tartrate  25 mg Oral BID  . pantoprazole  40 mg Oral BID  . simvastatin  40 mg Oral Daily  . sodium chloride flush  3 mL Intravenous Q12H  . umeclidinium-vilanterol  1 puff Inhalation BID  . vitamin B-12  1,000 mcg Oral Daily   acetaminophen **OR** acetaminophen, metoprolol tartrate, ondansetron **OR** ondansetron (ZOFRAN) IV, traMADol, traZODone  Assessment/  Plan:  [name] is a 84 y.o. black male with hypertension, hyperlipidemia, anemia, atrial fibrillation, peptic ulcer disease, congestive heart failure, sick sinus syndrome status post pacemaker, osteoarthritis   1. Acute renal failure on Chronic Kidney Disease stage III with proteinuria: CKD secondary to hypertension and NSAIDs. Baseline creatinine of 1.59, GFR of 43 on 03/01/18.  Acute renal failure from blood loss and hypotension.  - Continue benazepril for renal protection - Creatinine at baseline.   2. Hypertension: blood pressure  - Home regimen of benazepril, furosemide, and carvedilol  3. Acute Anemia on anemia of chronic kidney disease: with history of peptic ulcer disease from NSAIDs. Iron studies are low.  Status post PRBC transfusion on 4/22, 4/18 Appreciate GI input  4. Secondary Hyperparathyroidism with history of hypercalcemia: Not currently on a vitamin D agent.    LOS: 1 Karlina Suares 4/23/201911:18 AM

## 2018-04-06 NOTE — Progress Notes (Signed)
Nutrition Follow Up Note  DOCUMENTATION CODES:   Not applicable  INTERVENTION:   Ensure Enlive po BID, each supplement provides 350 kcal and 20 grams of protein  Liberalize diet   NUTRITION DIAGNOSIS:   Inadequate oral intake related to acute illness as evidenced by per patient/family report  GOAL:   Patient will meet greater than or equal to 90% of their needs  -progressing   MONITOR:   PO intake, Supplement acceptance, Labs, Weight trends, I & O's  ASSESSMENT:   84 y.o. male with known paroxysmal nonvalvular atrial fibrillation LV systolic dysfunction with chronic systolic dysfunction heart failure with ejection fraction of 35% peripheral vascular disease with significant claudication status post recent left leg intervention essential hypertension mixed hyperlipidemia mild aortic valve stenosis status post previous pacemaker in the past who was doing fairly well and had no evidence of significant cardiovascular symptoms when he was standing on a flower pot and it fell and broke and he sustained a laceration of his forehead.     Pt continues to do well; eating 100% of meals and drinking Ensure. Per chart, pt is weight stable. Pt with h/o H Pylori now s/p EGD and colonoscopy 4/21; per MD note, "EGD was essentially unremarkable, gastric biopsies were performed. Colonoscopy revealed subcentimeter polyp in the cecum that was resected and clipped; Large internal hemorrhoids and prominent rectal varices with no stigmata of recent bleeding." Continue supplements. Liberalize diet as a heart healthy diet restricts protein.    Medications reviewed and include: aspirin, plavix, colace, lasix, NaCl @75ml /hr  Labs reviewed: BUN 27(H), creat 1.90(H), Ca 8.8(L) Hgb 8.2(L), Hct 24.8(L) Glucose- 197- 4/18  NUTRITION - FOCUSED PHYSICAL EXAM:    Most Recent Value  Orbital Region  No depletion  Upper Arm Region  Moderate depletion  Thoracic and Lumbar Region  No depletion  Buccal Region  No  depletion  Temple Region  Mild depletion  Clavicle Bone Region  No depletion  Clavicle and Acromion Bone Region  No depletion  Scapular Bone Region  No depletion  Dorsal Hand  No depletion  Patellar Region  No depletion  Anterior Thigh Region  No depletion  Posterior Calf Region  No depletion  Edema (RD Assessment)  None  Hair  Reviewed  Eyes  Reviewed  Mouth  Reviewed  Skin  Reviewed  Nails  Reviewed     Diet Order:  Diet Heart Room service appropriate? Yes; Fluid consistency: Thin  EDUCATION NEEDS:   Education needs have been addressed  Skin:  Skin Assessment: (laceration head)  Last BM:  4/22- s/p bowel prep  Height:   Ht Readings from Last 1 Encounters:  04/02/18 5\' 10"  (1.778 m)    Weight:   Wt Readings from Last 1 Encounters:  04/06/18 168 lb 9.6 oz (76.5 kg)    Ideal Body Weight:  75.4 kg  BMI:  Body mass index is 24.19 kg/m.  Estimated Nutritional Needs:   Kcal:  1900-2200kcal/day   Protein:  75-90g/day   Fluid:  >1.9L/day   Koleen Distance MS, RD, LDN Pager #[phone] After Hours Pager: [phone]

## 2018-04-06 NOTE — Discharge Instructions (Signed)
Follow-up with primary care physician in 5 days for suture removal  follow-up with hematology Dr. Janese Banks on Friday Follow-up with gastroenterology Dr. Vicente Males 3 weeks Follow-up with nephrology Dr. Juleen China in 2 weeks Op chf clinic

## 2018-04-07 LAB — PROTEIN ELECTRO, RANDOM URINE
ALPHA-1-GLOBULIN, U: 4.2 %
ALPHA-2-GLOBULIN, U: 14.9 %
Albumin ELP, Urine: 20.9 %
BETA GLOBULIN, U: 39.3 %
Gamma Globulin, U: 20.7 %
Total Protein, Urine: 28.3 mg/dL

## 2018-04-07 LAB — MULTIPLE MYELOMA PANEL, SERUM
ALBUMIN SERPL ELPH-MCNC: 2.9 g/dL (ref 2.9–4.4)
ALPHA 1: 0.4 g/dL (ref 0.0–0.4)
Albumin/Glob SerPl: 1 (ref 0.7–1.7)
Alpha2 Glob SerPl Elph-Mcnc: 0.7 g/dL (ref 0.4–1.0)
B-Globulin SerPl Elph-Mcnc: 0.8 g/dL (ref 0.7–1.3)
Gamma Glob SerPl Elph-Mcnc: 1.1 g/dL (ref 0.4–1.8)
Globulin, Total: 3 g/dL (ref 2.2–3.9)
IGA: 131 mg/dL (ref 61–437)
IGM (IMMUNOGLOBULIN M), SRM: 44 mg/dL (ref 15–143)
IgG (Immunoglobin G), Serum: 1132 mg/dL (ref 700–1600)
M Protein SerPl Elph-Mcnc: 0.5 g/dL — ABNORMAL HIGH
TOTAL PROTEIN ELP: 5.9 g/dL — AB (ref 6.0–8.5)

## 2018-04-07 LAB — SURGICAL PATHOLOGY

## 2018-04-08 ENCOUNTER — Telehealth (INDEPENDENT_AMBULATORY_CARE_PROVIDER_SITE_OTHER): Payer: Self-pay

## 2018-04-08 DIAGNOSIS — N183 Chronic kidney disease, stage 3 (moderate): Secondary | ICD-10-CM | POA: Diagnosis not present

## 2018-04-08 DIAGNOSIS — I951 Orthostatic hypotension: Secondary | ICD-10-CM | POA: Diagnosis not present

## 2018-04-08 DIAGNOSIS — I429 Cardiomyopathy, unspecified: Secondary | ICD-10-CM | POA: Diagnosis not present

## 2018-04-08 DIAGNOSIS — I4891 Unspecified atrial fibrillation: Secondary | ICD-10-CM | POA: Diagnosis not present

## 2018-04-08 DIAGNOSIS — S0101XD Laceration without foreign body of scalp, subsequent encounter: Secondary | ICD-10-CM | POA: Diagnosis not present

## 2018-04-08 DIAGNOSIS — I5022 Chronic systolic (congestive) heart failure: Secondary | ICD-10-CM | POA: Diagnosis not present

## 2018-04-08 DIAGNOSIS — D631 Anemia in chronic kidney disease: Secondary | ICD-10-CM | POA: Diagnosis not present

## 2018-04-08 DIAGNOSIS — I130 Hypertensive heart and chronic kidney disease with heart failure and stage 1 through stage 4 chronic kidney disease, or unspecified chronic kidney disease: Secondary | ICD-10-CM | POA: Diagnosis not present

## 2018-04-08 DIAGNOSIS — R269 Unspecified abnormalities of gait and mobility: Secondary | ICD-10-CM | POA: Diagnosis not present

## 2018-04-08 DIAGNOSIS — I739 Peripheral vascular disease, unspecified: Secondary | ICD-10-CM | POA: Diagnosis not present

## 2018-04-08 NOTE — Telephone Encounter (Signed)
I spoke with patient granddaughter and she stated that he has an appt at the cancer center at 10:30 and also be having infusion for 3hours but she stated that will take the appt offer for tomorrow on 04/09/18 at 3pm to get left leg ultrasound to r/o dvt.

## 2018-04-08 NOTE — Telephone Encounter (Signed)
I spoke with patient about coming in the office tomorrow at 3pm dvt ultrasound ,at first he was going to wait and come in the office on the appointment he already has schedule with JD but I explain that this ultrasound is important and also inform that Dr Hall Busing wanted him to get this ultrasound to rule out dvt for his left leg.He wants me to call back in 30 min so he can speak with his granddaughter I will call the patient back

## 2018-04-09 ENCOUNTER — Inpatient Hospital Stay: Payer: Medicare HMO | Attending: Oncology | Admitting: Oncology

## 2018-04-09 ENCOUNTER — Encounter (INDEPENDENT_AMBULATORY_CARE_PROVIDER_SITE_OTHER): Payer: Self-pay

## 2018-04-09 ENCOUNTER — Inpatient Hospital Stay: Payer: Medicare HMO

## 2018-04-09 ENCOUNTER — Encounter: Payer: Self-pay | Admitting: Oncology

## 2018-04-09 ENCOUNTER — Other Ambulatory Visit (INDEPENDENT_AMBULATORY_CARE_PROVIDER_SITE_OTHER): Payer: Self-pay | Admitting: Vascular Surgery

## 2018-04-09 ENCOUNTER — Ambulatory Visit (INDEPENDENT_AMBULATORY_CARE_PROVIDER_SITE_OTHER): Payer: Medicare HMO

## 2018-04-09 DIAGNOSIS — I129 Hypertensive chronic kidney disease with stage 1 through stage 4 chronic kidney disease, or unspecified chronic kidney disease: Secondary | ICD-10-CM

## 2018-04-09 DIAGNOSIS — E538 Deficiency of other specified B group vitamins: Secondary | ICD-10-CM | POA: Diagnosis not present

## 2018-04-09 DIAGNOSIS — N189 Chronic kidney disease, unspecified: Secondary | ICD-10-CM | POA: Diagnosis not present

## 2018-04-09 DIAGNOSIS — Z87891 Personal history of nicotine dependence: Secondary | ICD-10-CM | POA: Insufficient documentation

## 2018-04-09 DIAGNOSIS — D631 Anemia in chronic kidney disease: Secondary | ICD-10-CM

## 2018-04-09 DIAGNOSIS — D509 Iron deficiency anemia, unspecified: Secondary | ICD-10-CM | POA: Diagnosis not present

## 2018-04-09 DIAGNOSIS — R609 Edema, unspecified: Secondary | ICD-10-CM

## 2018-04-09 DIAGNOSIS — R600 Localized edema: Secondary | ICD-10-CM

## 2018-04-09 DIAGNOSIS — D649 Anemia, unspecified: Secondary | ICD-10-CM

## 2018-04-09 LAB — CBC WITH DIFFERENTIAL/PLATELET
BASOS PCT: 1 %
Basophils Absolute: 0.1 10*3/uL (ref 0–0.1)
Eosinophils Absolute: 0.3 10*3/uL (ref 0–0.7)
Eosinophils Relative: 3 %
HCT: 24.2 % — ABNORMAL LOW (ref 40.0–52.0)
HEMOGLOBIN: 8.2 g/dL — AB (ref 13.0–18.0)
LYMPHS ABS: 1 10*3/uL (ref 1.0–3.6)
LYMPHS PCT: 9 %
MCH: 28.2 pg (ref 26.0–34.0)
MCHC: 33.7 g/dL (ref 32.0–36.0)
MCV: 83.8 fL (ref 80.0–100.0)
MONO ABS: 0.9 10*3/uL (ref 0.2–1.0)
MONOS PCT: 9 %
NEUTROS ABS: 8.2 10*3/uL — AB (ref 1.4–6.5)
NEUTROS PCT: 78 %
Platelets: 216 10*3/uL (ref 150–440)
RBC: 2.89 MIL/uL — ABNORMAL LOW (ref 4.40–5.90)
RDW: 19.7 % — AB (ref 11.5–14.5)
WBC: 10.5 10*3/uL (ref 3.8–10.6)

## 2018-04-09 LAB — SAMPLE TO BLOOD BANK

## 2018-04-09 NOTE — Progress Notes (Signed)
Hematology/Oncology Consult note [facility]  [phone] [phone]  Patient Care Team: Albina Billet, MD as PCP - General (Internal Medicine)   Name of the patient: Devin Washington  [id number]  1923-12-04   Date of visit: 04/09/18  Diagnosis- anemia likely multifactorial- iron and b12 deficiency and anemia of chronic kidney disease  Chief complaint/ Reason for visit- post hospital discharge f/u of anemia  Heme/Onc history: patient is a 84 year old male with a past medical history significant for atrial fibrillation, CHF, hypertension and prostate cancer who presented to the ER after he suffered a fall.  On admission he was found to have H&H of 7.1/22.2 with an MCV of 80.1 and a platelet count of 268.  BMP showed an elevated creatinine of 1.9.  TSH was normal at 0.52.  Patient received 1 unit of PRBC in the ER.  Ferritin checked per day later was 152.  Iron studies revealed low serum iron of 27, low TIBC of 229 and low iron saturation of 12%.  B12 levels were low normal at 264.  Folate level was normal at 17.5.  Reticulocyte count was low at 2% for the degree of anemia.    Coombs test was negative and haptoglobin was normal.  LDH was mildly elevated at 228.  Myeloma panel showed M protein of IgG kappa 0.5 g.  Both kappa and lambda free light chains were elevated with a normal free light chain ratio 0.77.  E Po level was 22.3  Patient underwent EGD and colonoscopy on 04/04/2018.  EGD showed nonobstructing nonbleeding superficial duodenal ulcers with a clean ulcer base.  There are nonbleeding erosions in the gastric antrum and no stigmata of recent bleeding.  Duodenum were suspicious for H. pylori.  Colonoscopy showed hemorrhoids and 7 mm cecal polyp   Interval history- patient is doing relatively well since his discharge. Does report fatigue. Has some discomfort over his chest wall after the fall.  ECOG PS- 2 Pain scale- 2   Review of systems-  Review of Systems  Constitutional: Positive for malaise/fatigue. Negative for chills, fever and weight loss.  HENT: Negative for congestion, ear discharge and nosebleeds.   Eyes: Negative for blurred vision.  Respiratory: Negative for cough, hemoptysis, sputum production, shortness of breath and wheezing.   Cardiovascular: Negative for chest pain, palpitations, orthopnea and claudication.  Gastrointestinal: Negative for abdominal pain, blood in stool, constipation, diarrhea, heartburn, melena, nausea and vomiting.  Genitourinary: Negative for dysuria, flank pain, frequency, hematuria and urgency.  Musculoskeletal: Negative for back pain, joint pain and myalgias.  Skin: Negative for rash.  Neurological: Negative for dizziness, tingling, focal weakness, seizures, weakness and headaches.  Endo/Heme/Allergies: Does not bruise/bleed easily.  Psychiatric/Behavioral: Negative for depression and suicidal ideas. The patient does not have insomnia.       No Known Allergies   Past Medical History:  Diagnosis Date  . A-fib ([place])   . Cardiomyopathy ([place])   . CHF (congestive heart failure) ([place])   . DJD (degenerative joint disease)   . Duodenal ulcer 08/09/2015  . Dysrhythmia   . Erosive esophagitis   . HH (hiatus hernia)   . HLD (hyperlipidemia)   . Hypertension   . Pacemaker   . PAD (peripheral artery disease) ([place])   . Peripheral vascular disease ([place])   . Presence of permanent cardiac pacemaker   . Prostate cancer [facility])    Prostatectomy.  . 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 Shortness of breath   . SSS (sick  sinus syndrome) [facility])      Past Surgical History:  Procedure Laterality Date  . CATARACT EXTRACTION, BILATERAL    . COLONOSCOPY N/A 04/04/2018   Procedure: COLONOSCOPY;  Surgeon: Lin Landsman, MD;  Location: [facility] ENDOSCOPY;  Service: Gastroenterology;  Laterality: N/A;  . ESOPHAGOGASTRODUODENOSCOPY N/A 08/09/2015   Procedure: ESOPHAGOGASTRODUODENOSCOPY (EGD) looking in the  stomach with a lighted tube to evaluate and treat;  Surgeon: Lollie Sails, MD;  Location: [facility] ENDOSCOPY;  Service: Endoscopy;  Laterality: N/A;  Procedure to be done tomorrow afternoon, 08/08/2015. Awaiting cardiology consult  . ESOPHAGOGASTRODUODENOSCOPY N/A 04/04/2018   Procedure: ESOPHAGOGASTRODUODENOSCOPY (EGD);  Surgeon: Lin Landsman, MD;  Location: [facility] ENDOSCOPY;  Service: Gastroenterology;  Laterality: N/A;  . EYE SURGERY    . FEMORAL-POPLITEAL BYPASS GRAFT Left 12/16/2017   Procedure: BYPASS GRAFT FEMORAL-POPLITEAL ARTERY ( OPEN POPLITEAL BYPASS );  Surgeon: Algernon Huxley, MD;  Location: [facility] ORS;  Service: Vascular;  Laterality: Left;  . FLEXIBLE SIGMOIDOSCOPY N/A 08/09/2015   Procedure: FLEXIBLE SIGMOIDOSCOPY looking up the rectum into the distal colon with a lighted tube to examine and treat;  Surgeon: Lollie Sails, MD;  Location: [facility] ENDOSCOPY;  Service: Endoscopy;  Laterality: N/A;  Procedure to be done tomorrow afternoon, 08/08/2015. Awaiting cardiology consult.  . INSERT / REPLACE / REMOVE PACEMAKER    . JOINT REPLACEMENT Left    left knee replacement  . LOWER EXTREMITY ANGIOGRAPHY Left 11/16/2017   Procedure: LOWER EXTREMITY ANGIOGRAPHY;  Surgeon: Algernon Huxley, MD;  Location: [place] CV LAB;  Service: Cardiovascular;  Laterality: Left;  . LOWER EXTREMITY ANGIOGRAPHY Right 11/30/2017   Procedure: LOWER EXTREMITY ANGIOGRAPHY;  Surgeon: Algernon Huxley, MD;  Location: [place] CV LAB;  Service: Cardiovascular;  Laterality: Right;  . LOWER EXTREMITY ANGIOGRAPHY Left 03/04/2018   Procedure: LOWER EXTREMITY ANGIOGRAPHY;  Surgeon: Algernon Huxley, MD;  Location: [place] CV LAB;  Service: Cardiovascular;  Laterality: Left;  . PACEMAKER INSERTION    . PROSTATE SURGERY    . PROSTATE SURGERY      Social History   Socioeconomic History  . Marital status: Widowed    Spouse name: Not on file  . Number of children: Not on file  . Years of education: Not on file    . Highest education level: Not on file  Occupational History  . Not on file  Social Needs  . Financial resource strain: Not on file  . Food insecurity:    Worry: Not on file    Inability: Not on file  . Transportation needs:    Medical: Not on file    Non-medical: Not on file  Tobacco Use  . Smoking status: Former Smoker    Last attempt to quit: 03/02/1958    Years since quitting: 60.1  . Smokeless tobacco: Former Systems developer    Types: Chew, Snuff  Substance and Sexual Activity  . Alcohol use: No    Alcohol/week: 0.0 oz  . Drug use: No  . Sexual activity: Not Currently  Lifestyle  . Physical activity:    Days per week: Not on file    Minutes per session: Not on file  . Stress: Not on file  Relationships  . Social connections:    Talks on phone: Not on file    Gets together: Not on file    Attends religious service: Not on file    Active member of club or organization: Not on file    Attends meetings of clubs or organizations: Not  on file    Relationship status: Not on file  . Intimate partner violence:    Fear of current or ex partner: Not on file    Emotionally abused: Not on file    Physically abused: Not on file    Forced sexual activity: Not on file  Other Topics Concern  . Not on file  Social History Narrative  . Not on file    Family History  Problem Relation Age of Onset  . Lung cancer Father      Current Outpatient Medications:  .  ANORO ELLIPTA 62.5-25 MCG/INH AEPB, Inhale 1 puff into the lungs 2 (two) times daily., Disp: , Rfl:  .  aspirin EC 81 MG EC tablet, Take 1 tablet (81 mg total) by mouth daily., Disp: 30 tablet, Rfl: 3 .  docusate sodium (COLACE) 100 MG capsule, Take 1 capsule (100 mg total) by mouth daily., Disp: 10 capsule, Rfl: 0 .  feeding supplement, ENSURE ENLIVE, (ENSURE ENLIVE) LIQD, Take 237 mLs by mouth 2 (two) times daily between meals., Disp: 60 Bottle, Rfl: 1 .  ferrous sulfate 325 (65 FE) MG tablet, Take 1 tablet (325 mg total) by  mouth 2 (two) times daily with a meal., Disp: 60 tablet, Rfl: 3 .  furosemide (LASIX) 20 MG tablet, Take 1 tablet by mouth daily., Disp: , Rfl:  .  metoprolol tartrate (LOPRESSOR) 25 MG tablet, Take 1 tablet (25 mg total) by mouth 2 (two) times daily., Disp: 60 tablet, Rfl: 0 .  pantoprazole (PROTONIX) 40 MG tablet, Take 1 tablet (40 mg total) by mouth daily., Disp: 30 tablet, Rfl: 0 .  simvastatin (ZOCOR) 20 MG tablet, Take 40 mg by mouth daily. , Disp: , Rfl:  .  vitamin B-12 1000 MCG tablet, Take 1 tablet (1,000 mcg total) by mouth daily., Disp: 60 tablet, Rfl: 1 .  acetaminophen (TYLENOL) 325 MG tablet, Take 2 tablets (650 mg total) by mouth every 6 (six) hours as needed for mild pain (or Fever >/= 101). (Patient not taking: Reported on 04/09/2018), Disp: , Rfl:  .  albuterol (PROVENTIL HFA;VENTOLIN HFA) 108 (90 Base) MCG/ACT inhaler, Inhale 2 puffs into the lungs every 6 (six) hours as needed for wheezing or shortness of breath. (Patient not taking: Reported on 04/09/2018), Disp: 1 Inhaler, Rfl: 2 .  benazepril (LOTENSIN) 20 MG tablet, Take 1 tablet (20 mg total) by mouth daily. (Patient not taking: Reported on 04/09/2018), Disp: 30 tablet, Rfl: 0 .  traMADol (ULTRAM) 50 MG tablet, Take 1 tablet (50 mg total) by mouth every 6 (six) hours as needed for moderate pain or severe pain. (Patient not taking: Reported on 04/09/2018), Disp: 20 tablet, Rfl: 0 .  traZODone (DESYREL) 50 MG tablet, Take 1 tablet by mouth at bedtime as needed for sleep. , Disp: , Rfl:   Physical exam:  Vitals:   04/09/18 1124  BP: 135/68  Pulse: 69  Resp: 18  Temp: 97.8 F (36.6 C)  TempSrc: Tympanic  SpO2: 96%  Weight: 175 lb (79.4 kg)  Height: _0  (1.778 m)   Physical Exam  Constitutional: He is oriented to person, place, and time.  Frail elderly gentleman in no acute distress  HENT:  Head: Normocephalic and atraumatic.  Eyes: Pupils are equal, round, and reactive to light. EOM are normal.  Neck: Normal range  of motion.  Cardiovascular: Normal rate and regular rhythm.  Systolic murmur+  Pulmonary/Chest: Effort normal and breath sounds normal.  Abdominal: Soft. Bowel sounds are normal.  No evidence of bruising noted over bilateral flanks  Neurological: He is alert and oriented to person, place, and time.  Skin: Skin is warm and dry.     CMP Latest Ref Rng & Units 04/05/2018  Glucose 65 - 99 mg/dL -  BUN 6 - 20 mg/dL -  Creatinine 0.61 - 1.24 mg/dL -  Sodium 135 - 145 mmol/L -  Potassium 3.5 - 5.1 mmol/L -  Chloride 101 - 111 mmol/L -  CO2 22 - 32 mmol/L -  Calcium 8.9 - 10.3 mg/dL -  Total Protein 6.5 - 8.1 g/dL 5.8(L)  Total Bilirubin 0.3 - 1.2 mg/dL 1.1  Alkaline Phos 38 - 126 U/L 50  AST 15 - 41 U/L 29  ALT 17 - 63 U/L 19   CBC Latest Ref Rng & Units 04/09/2018  WBC 3.8 - 10.6 K/uL 10.5  Hemoglobin 13.0 - 18.0 g/dL 8.2(L)  Hematocrit 40.0 - 52.0 % 24.2(L)  Platelets 150 - 440 K/uL 216    No images are attached to the encounter.  Dg Chest 2 View  Result Date: 04/03/2018 CLINICAL DATA:  Shortness of breath today.  Ex-smoker. EXAM: CHEST - 2 VIEW COMPARISON:  04/23/2017. FINDINGS: Borderline enlarged cardiac silhouette with a mild interval increase in size. Stable left subclavian pacemaker leads. Clear lungs with normal vascularity. The lungs remain hyperexpanded. Thoracic spine degenerative changes. IMPRESSION: No acute abnormality.  Stable mild changes of COPD. Electronically Signed   By: Claudie Revering M.D.   On: 04/03/2018 13:51   Ct Head Wo Contrast  Result Date: 04/01/2018 CLINICAL DATA:  Mechanical fall, hitting head on ground. EXAM: CT HEAD WITHOUT CONTRAST TECHNIQUE: Contiguous axial images were obtained from the base of the skull through the vertex without intravenous contrast. COMPARISON:  10/22/2006. FINDINGS: Brain: No evidence for acute infarction, hemorrhage, mass lesion, or extra-axial fluid. Ventricular prominence, consistent with hydrocephalus ex vacuo. Hypoattenuation  of white matter, likely small vessel disease. Vascular: Calcification of the cavernous internal carotid arteries consistent with cerebrovascular atherosclerotic disease. No signs of intracranial large vessel occlusion. Skull: No skull fracture. Soft tissue swelling and hematoma over the RIGHT frontal region, with laceration. Sinuses/Orbits: Mild chronic sinus disease.  Negative orbits. Other: None. Compared with priors, there has been mild progressive brain substance loss. IMPRESSION: Atrophy and small vessel disease.  No acute intracranial findings. No skull fracture or intracranial hemorrhage. RIGHT frontal scalp hematoma. Electronically Signed   By: Staci Righter M.D.   On: 04/01/2018 18:24     Assessment and plan- Patient is a 84 y.o. male with normocytic anemia likely multifactorial secondary to iron and B12 deficiency as well as anemia of chronic kidney disease  Today patient's hemoglobin is 8.2.  He is currently on oral B12.  He did receive 1 dose of Feraheme in the hospital.  He will be following up with nephrology as an outpatient as well.  I will proceed with monthly Aranesp at this time and I will give him 100 mcg today.  He will return next week for second dose of Feraheme.  I will see him back in 1 month's time with a CBC and BMP for dose 2 of Aranesp.  If his hemoglobin does not improve despite iron B12 and Aranesp I will consider a bone marrow biopsy but I would like to avoid doing that in this elderly frail gentleman  Discussed risks and benefits of Aranesp including all but not limited to risk of thrombosis if hemoglobin is attempted to raise at higher levels  more than 12.  We will plan to give him Aranesp with a goal to keep his hemoglobin between 10-11 and hold if hemoglobin is more than 10.  Patient and his niece are in understanding of the plan  He does have a small amount of monoclonal protein noted on his SPEP of 0.5 g IgG kappa but I do not think that this is contributing to his  anemia and is likely due to MGUS which can be monitored conservatively   Visit Diagnosis 1. Iron deficiency anemia, unspecified iron deficiency anemia type      Dr. Randa Evens, MD, MPH [facility] [phone] 04/09/2018 11:59 AM

## 2018-04-11 MED ORDER — CEFAZOLIN SODIUM-DEXTROSE 2-4 GM/100ML-% IV SOLN: 2.0000 g | Freq: Once | INTRAVENOUS | Status: DC

## 2018-04-12 ENCOUNTER — Encounter
Admission: RE | Disposition: A | Discharge status: Home / Self Care | Payer: Self-pay | Source: Ambulatory Visit | Attending: Vascular Surgery

## 2018-04-12 ENCOUNTER — Ambulatory Visit
Admission: RE | Admit: 2018-04-12 | Discharge: 2018-04-12 | Disposition: A | Discharge status: Home / Self Care | Payer: Medicare HMO | Source: Ambulatory Visit | Attending: Vascular Surgery | Admitting: Vascular Surgery

## 2018-04-12 DIAGNOSIS — N183 Chronic kidney disease, stage 3 (moderate): Secondary | ICD-10-CM | POA: Insufficient documentation

## 2018-04-12 DIAGNOSIS — Z7982 Long term (current) use of aspirin: Secondary | ICD-10-CM | POA: Insufficient documentation

## 2018-04-12 DIAGNOSIS — Z87891 Personal history of nicotine dependence: Secondary | ICD-10-CM | POA: Insufficient documentation

## 2018-04-12 DIAGNOSIS — I4891 Unspecified atrial fibrillation: Secondary | ICD-10-CM | POA: Diagnosis not present

## 2018-04-12 DIAGNOSIS — I70219 Atherosclerosis of native arteries of extremities with intermittent claudication, unspecified extremity: Secondary | ICD-10-CM

## 2018-04-12 DIAGNOSIS — Z9889 Other specified postprocedural states: Secondary | ICD-10-CM | POA: Insufficient documentation

## 2018-04-12 DIAGNOSIS — I723 Aneurysm of iliac artery: Secondary | ICD-10-CM | POA: Diagnosis not present

## 2018-04-12 DIAGNOSIS — Z96652 Presence of left artificial knee joint: Secondary | ICD-10-CM | POA: Insufficient documentation

## 2018-04-12 DIAGNOSIS — I429 Cardiomyopathy, unspecified: Secondary | ICD-10-CM | POA: Insufficient documentation

## 2018-04-12 DIAGNOSIS — I130 Hypertensive heart and chronic kidney disease with heart failure and stage 1 through stage 4 chronic kidney disease, or unspecified chronic kidney disease: Secondary | ICD-10-CM | POA: Diagnosis not present

## 2018-04-12 DIAGNOSIS — Z9841 Cataract extraction status, right eye: Secondary | ICD-10-CM | POA: Insufficient documentation

## 2018-04-12 DIAGNOSIS — E785 Hyperlipidemia, unspecified: Secondary | ICD-10-CM | POA: Insufficient documentation

## 2018-04-12 DIAGNOSIS — I509 Heart failure, unspecified: Secondary | ICD-10-CM | POA: Diagnosis not present

## 2018-04-12 DIAGNOSIS — Z9842 Cataract extraction status, left eye: Secondary | ICD-10-CM | POA: Diagnosis not present

## 2018-04-12 DIAGNOSIS — I739 Peripheral vascular disease, unspecified: Secondary | ICD-10-CM | POA: Insufficient documentation

## 2018-04-12 DIAGNOSIS — I743 Embolism and thrombosis of arteries of the lower extremities: Secondary | ICD-10-CM | POA: Diagnosis not present

## 2018-04-12 DIAGNOSIS — I724 Aneurysm of artery of lower extremity: Secondary | ICD-10-CM | POA: Insufficient documentation

## 2018-04-12 DIAGNOSIS — Z8546 Personal history of malignant neoplasm of prostate: Secondary | ICD-10-CM | POA: Diagnosis not present

## 2018-04-12 DIAGNOSIS — Z7901 Long term (current) use of anticoagulants: Secondary | ICD-10-CM | POA: Insufficient documentation

## 2018-04-12 DIAGNOSIS — Z955 Presence of coronary angioplasty implant and graft: Secondary | ICD-10-CM | POA: Diagnosis not present

## 2018-04-12 DIAGNOSIS — Z95828 Presence of other vascular implants and grafts: Secondary | ICD-10-CM | POA: Insufficient documentation

## 2018-04-12 DIAGNOSIS — Z79899 Other long term (current) drug therapy: Secondary | ICD-10-CM | POA: Insufficient documentation

## 2018-04-12 HISTORY — PX: LOWER EXTREMITY ANGIOGRAPHY: CATH118251

## 2018-04-12 LAB — CREATININE, SERUM
CREATININE: 1.64 mg/dL — AB (ref 0.61–1.24)
GFR calc Af Amer: 40 mL/min — ABNORMAL LOW (ref >60)
GFR calc non Af Amer: 35 mL/min — ABNORMAL LOW (ref >60)

## 2018-04-12 LAB — BUN: BUN: 31 mg/dL — AB (ref 6–20)

## 2018-04-12 SURGERY — LOWER EXTREMITY ANGIOGRAPHY
Anesthesia: Moderate Sedation | Laterality: Left

## 2018-04-12 MED ORDER — LABETALOL HCL 5 MG/ML IV SOLN: 10.0000 mg | INTRAVENOUS | Status: DC | PRN

## 2018-04-12 MED ORDER — SODIUM CHLORIDE 0.9 % IV SOLN: INTRAVENOUS | Status: DC

## 2018-04-12 MED ORDER — HEPARIN SODIUM (PORCINE) 1000 UNIT/ML IJ SOLN
INTRAMUSCULAR | Status: AC
  Filled 2018-04-12: qty 1

## 2018-04-12 MED ORDER — MIDAZOLAM HCL 5 MG/5ML IJ SOLN
INTRAMUSCULAR | Status: AC
  Filled 2018-04-12: qty 5

## 2018-04-12 MED ORDER — FENTANYL CITRATE (PF) 100 MCG/2ML IJ SOLN
INTRAMUSCULAR | Status: AC
  Filled 2018-04-12: qty 2

## 2018-04-12 MED ORDER — ONDANSETRON HCL 4 MG/2ML IJ SOLN: 4.0000 mg | Freq: Four times a day (QID) | INTRAMUSCULAR | Status: DC | PRN

## 2018-04-12 MED ORDER — METHYLPREDNISOLONE SODIUM SUCC 125 MG IJ SOLR: 125.0000 mg | INTRAMUSCULAR | Status: DC | PRN

## 2018-04-12 MED ORDER — LIDOCAINE-EPINEPHRINE (PF) 1 %-1:200000 IJ SOLN
INTRAMUSCULAR | Status: AC
  Filled 2018-04-12: qty 30

## 2018-04-12 MED ORDER — SODIUM CHLORIDE 0.9 % IV SOLN: 250.0000 mL | INTRAVENOUS | Status: DC | PRN

## 2018-04-12 MED ORDER — FENTANYL CITRATE (PF) 100 MCG/2ML IJ SOLN
INTRAMUSCULAR | Status: DC | PRN
  Administered 2018-04-12 (×5): 25 ug via INTRAVENOUS

## 2018-04-12 MED ORDER — SODIUM CHLORIDE 0.9 % IV SOLN
INTRAVENOUS | Status: DC
  Administered 2018-04-12: 08:00:00 via INTRAVENOUS

## 2018-04-12 MED ORDER — SODIUM CHLORIDE 0.9% FLUSH: 3.0000 mL | INTRAVENOUS | Status: DC | PRN

## 2018-04-12 MED ORDER — MIDAZOLAM HCL 2 MG/2ML IJ SOLN
INTRAMUSCULAR | Status: DC | PRN
  Administered 2018-04-12 (×5): 1 mg via INTRAVENOUS

## 2018-04-12 MED ORDER — IOPAMIDOL (ISOVUE-300) INJECTION 61%
INTRAVENOUS | Status: DC | PRN
  Administered 2018-04-12: 45 mL via INTRAVENOUS

## 2018-04-12 MED ORDER — FAMOTIDINE 20 MG PO TABS: 40.0000 mg | ORAL_TABLET | ORAL | Status: DC | PRN

## 2018-04-12 MED ORDER — HYDRALAZINE HCL 20 MG/ML IJ SOLN: 5.0000 mg | INTRAMUSCULAR | Status: DC | PRN

## 2018-04-12 MED ORDER — HEPARIN SODIUM (PORCINE) 1000 UNIT/ML IJ SOLN
INTRAMUSCULAR | Status: DC | PRN
  Administered 2018-04-12: 3000 [IU] via INTRAVENOUS

## 2018-04-12 MED ORDER — SODIUM CHLORIDE 0.9% FLUSH: 3.0000 mL | Freq: Two times a day (BID) | INTRAVENOUS | Status: DC

## 2018-04-12 MED ORDER — ACETAMINOPHEN 325 MG PO TABS: 650.0000 mg | ORAL_TABLET | ORAL | Status: DC | PRN

## 2018-04-12 MED ORDER — HEPARIN (PORCINE) IN NACL 1000-0.9 UT/500ML-% IV SOLN
INTRAVENOUS | Status: AC
  Filled 2018-04-12: qty 1000

## 2018-04-12 SURGICAL SUPPLY — 19 items
CATH MICROCATH PRGRT 130CM (CATHETERS) ×1 IMPLANT
CATH PIG 70CM (CATHETERS) ×2 IMPLANT
COIL 400 COMPLEX SOFT 20X60CM (Vascular Products) ×2 IMPLANT
COIL 400 COMPLEX STD 24X57CM (Vascular Products) ×4 IMPLANT
COIL 400 COMPLEX STD 28X60CM (Vascular Products) ×2 IMPLANT
DEVICE OCCLUSION POD6 (Vascular Products) ×1 IMPLANT
DEVICE OCCLUSION PODJ60 (Vascular Products) ×2 IMPLANT
DEVICE STARCLOSE SE CLOSURE (Vascular Products) ×2 IMPLANT
HANDLE DETACHMENT COIL (MISCELLANEOUS) ×2 IMPLANT
MICROCATH PROGREAT 130CM (CATHETERS) ×2
OCCLUSION DEVICE POD6 (Vascular Products) ×2 IMPLANT
OCCLUSION DEVICE PODJ60 (Vascular Products) ×4 IMPLANT
PACK ANGIOGRAPHY (CUSTOM PROCEDURE TRAY) ×2 IMPLANT
SHEATH ANL1 5FRX70 (SHEATH) ×2 IMPLANT
SHEATH BRITE TIP 5FRX11 (SHEATH) ×2 IMPLANT
TUBING CONTRAST HIGH PRESS 72 (TUBING) ×2 IMPLANT
WIRE G V18X300CM (WIRE) ×2 IMPLANT
WIRE J 3MM .035X145CM (WIRE) ×2 IMPLANT
WIRE MAGIC TORQUE 260C (WIRE) ×2 IMPLANT

## 2018-04-12 NOTE — H&P (Signed)
[facility] History & Physical Update  The patient was interviewed and re-examined.  The patient's previous History and Physical has been reviewed and is unchanged.  There is no change in the plan of care. We plan to proceed with the scheduled procedure.  Leotis Pain, MD  04/12/2018, 8:55 AM

## 2018-04-12 NOTE — Discharge Instructions (Signed)
Femoral Site Care Refer to this sheet in the next few weeks. These instructions provide you with information about caring for yourself after your procedure. Your health care provider may also give you more specific instructions. Your treatment has been planned according to current medical practices, but problems sometimes occur. Call your health care provider if you have any problems or questions after your procedure. What can I expect after the procedure? After your procedure, it is typical to have the following: Bruising at the site that usually fades within 1-2 weeks. Blood collecting in the tissue (hematoma) that may be painful to the touch. It should usually decrease in size and tenderness within 1-2 weeks.  Follow these instructions at home: Take medicines only as directed by your health care provider. You may shower 24-48 hours after the procedure or as directed by your health care provider. Remove the bandage (dressing) and gently wash the site with plain soap and water. Pat the area dry with a clean towel. Do not rub the site, because this may cause bleeding. Do not take baths, swim, or use a hot tub until your health care provider approves. Check your insertion site every day for redness, swelling, or drainage. Do not apply powder or lotion to the site. Limit use of stairs to twice a day for the first 2-3 days or as directed by your health care provider. Do not squat for the first 2-3 days or as directed by your health care provider. Do not lift over 10 lb (4.5 kg) for 5 days after your procedure or as directed by your health care provider. Ask your health care provider when it is okay to: Return to work or school. Resume usual physical activities or sports. Resume sexual activity. Do not drive home if you are discharged the same day as the procedure. Have someone else drive you. You may drive 24 hours after the procedure unless otherwise instructed by your health care provider. Do  not operate machinery or power tools for 24 hours after the procedure or as directed by your health care provider. If your procedure was done as an outpatient procedure, which means that you went home the same day as your procedure, a responsible adult should be with you for the first 24 hours after you arrive home. Keep all follow-up visits as directed by your health care provider. This is important. Contact a health care provider if: You have a fever. You have chills. You have increased bleeding from the site. Hold pressure on the site. Get help right away if: You have unusual pain at the site. You have redness, warmth, or swelling at the site. You have drainage (other than a small amount of blood on the dressing) from the site. The site is bleeding, and the bleeding does not stop after 30 minutes of holding steady pressure on the site. Your leg or foot becomes pale, cool, tingly, or numb. This information is not intended to replace advice given to you by your health care provider. Make sure you discuss any questions you have with your health care provider. Document Released: 08/04/2014 Document Revised: 05/08/2016 Document Reviewed: 06/20/2014 Elsevier Interactive Patient Education  2018 [employer] After This sheet gives you information about how to care for yourself after your procedure. Your health care provider may also give you more specific instructions. If you have problems or questions, contact your health care provider. What can I expect after the procedure? After the procedure, it is common to have bruising  and tenderness at the catheter insertion area. Follow these instructions at home: Insertion site care  Follow instructions from your health care provider about how to take care of your insertion site. Make sure you: ? Wash your hands with soap and water before you change your bandage (dressing). If soap and water are not available, use hand  sanitizer. ? Change your dressing as told by your health care provider. ? Leave stitches (sutures), skin glue, or adhesive strips in place. These skin closures may need to stay in place for 2 weeks or longer. If adhesive strip edges start to loosen and curl up, you may trim the loose edges. Do not remove adhesive strips completely unless your health care provider tells you to do that.  Do not take baths, swim, or use a hot tub until your health care provider approves.  You may shower 24-48 hours after the procedure or as told by your health care provider. ? Gently wash the site with plain soap and water. ? Pat the area dry with a clean towel. ? Do not rub the site. This may cause bleeding.  Do not apply powder or lotion to the site. Keep the site clean and dry.  Check your insertion site every day for signs of infection. Check for: ? Redness, swelling, or pain. ? Fluid or blood. ? Warmth. ? Pus or a bad smell. Activity  Rest as told by your health care provider, usually for 1-2 days.  Do not lift anything that is heavier than 10 lbs. (4.5 kg) or as told by your health care provider.  Do not drive for 24 hours if you were given a medicine to help you relax (sedative).  Do not drive or use heavy machinery while taking prescription pain medicine. General instructions  Return to your normal activities as told by your health care provider, usually in about a week. Ask your health care provider what activities are safe for you.  If the catheter site starts bleeding, lie flat and put pressure on the site. If the bleeding does not stop, get help right away. This is a medical emergency.  Drink enough fluid to keep your urine clear or pale yellow. This helps flush the contrast dye from your body.  Take over-the-counter and prescription medicines only as told by your health care provider.  Keep all follow-up visits as told by your health care provider. This is important. Contact a health  care provider if:  You have a fever or chills.  You have redness, swelling, or pain around your insertion site.  You have fluid or blood coming from your insertion site.  The insertion site feels warm to the touch.  You have pus or a bad smell coming from your insertion site.  You have bruising around the insertion site.  You notice blood collecting in the tissue around the catheter site (hematoma). The hematoma may be painful to the touch. Get help right away if:  You have severe pain at the catheter insertion area.  The catheter insertion area swells very fast.  The catheter insertion area is bleeding, and the bleeding does not stop when you hold steady pressure on the area.  The area near or just beyond the catheter insertion site becomes pale, cool, tingly, or numb. These symptoms may represent a serious problem that is an emergency. Do not wait to see if the symptoms will go away. Get medical help right away. Call your local emergency services (911 in the [place] Do not  drive yourself to the hospital. Summary  After the procedure, it is common to have bruising and tenderness at the catheter insertion area.  After the procedure, it is important to rest and drink plenty of fluids.  Do not take baths, swim, or use a hot tub until your health care provider says it is okay to do so. You may shower 24-48 hours after the procedure or as told by your health care provider.  If the catheter site starts bleeding, lie flat and put pressure on the site. If the bleeding does not stop, get help right away. This is a medical emergency. This information is not intended to replace advice given to you by your health care provider. Make sure you discuss any questions you have with your health care provider. Document Released: 06/19/2005 Document Revised: 11/05/2016 Document Reviewed: 11/05/2016 Elsevier Interactive Patient Education  2018 [employer]. Moderate Conscious Sedation, Adult, Care  After These instructions provide you with information about caring for yourself after your procedure. Your health care provider may also give you more specific instructions. Your treatment has been planned according to current medical practices, but problems sometimes occur. Call your health care provider if you have any problems or questions after your procedure. What can I expect after the procedure? After your procedure, it is common:  To feel sleepy for several hours.  To feel clumsy and have poor balance for several hours.  To have poor judgment for several hours.  To vomit if you eat too soon.  Follow these instructions at home: For at least 24 hours after the procedure:   Do not: ? Participate in activities where you could fall or become injured. ? Drive. ? Use heavy machinery. ? Drink alcohol. ? Take sleeping pills or medicines that cause drowsiness. ? Make important decisions or sign legal documents. ? Take care of children on your own.  Rest. Eating and drinking  Follow the diet recommended by your health care provider.  If you vomit: ? Drink water, juice, or soup when you can drink without vomiting. ? Make sure you have little or no nausea before eating solid foods. General instructions  Have a responsible adult stay with you until you are awake and alert.  Take over-the-counter and prescription medicines only as told by your health care provider.  If you smoke, do not smoke without supervision.  Keep all follow-up visits as told by your health care provider. This is important. Contact a health care provider if:  You keep feeling nauseous or you keep vomiting.  You feel light-headed.  You develop a rash.  You have a fever. Get help right away if:  You have trouble breathing. This information is not intended to replace advice given to you by your health care provider. Make sure you discuss any questions you have with your health care  provider. Document Released: 09/21/2013 Document Revised: 05/05/2016 Document Reviewed: 03/22/2016 Elsevier Interactive Patient Education  [employer].

## 2018-04-12 NOTE — Op Note (Signed)
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  Ultrasound guidance for vascular access right femoral artery             2.  Catheter placement into left femoral to distal bypass graft and left native superficial femoral artery from right femoral approach             3.  Aortogram and selective left lower extremity angiogram             4.   Coil embolization of the mid and distal left superficial femoral artery to reduce flow into the aneurysm using 3 standard coils 24 to 28 mm in diameter, one soft coil 20 mm diameter, and 3 PODS             5.   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 The patient has noninvasive study suggesting occlusion of his bypass graft. The patient is brought in for angiography for further evaluation and potential treatment.  Due to the limb threatening nature of the situation, angiogram was performed for attempted limb salvage. The patient is aware that if the procedure fails, amputation would be expected.  The patient also understands that even with successful revascularization, amputation may still be required due to the severity of the situation. Risks and benefits are discussed and informed consent is obtained.   Procedure:  The patient was identified and appropriate procedural time out was performed.  The patient was then placed supine on the table and prepped and  draped in the usual sterile fashion. Moderate conscious sedation was administered during a face to face encounter with the patient throughout the procedure with my supervision of the RN administering medicines and monitoring the patient's vital signs, pulse oximetry, telemetry and mental status throughout from the start of the procedure until the patient was taken to the recovery room. Ultrasound was used to evaluate the right common femoral artery.  It was patent .  A digital ultrasound image was acquired.  A Seldinger needle was used to access the right common femoral artery under direct ultrasound guidance and a permanent image was performed.  A 0.035 J wire was advanced without resistance and a 5Fr sheath was placed.   Portogram was not performed as this was recently performed and no intervention was required to the aorta. I then crossed the aortic bifurcation and advanced to the left femoral head.  Actually advanced down into the left femoral to distal bypass and performed imaging from this.  Selective left lower extremity angiogram was then performed. This demonstrated the common femoral artery was patent.  The profunda femoris artery was patent.  The bypass graft to the peroneal artery was widely patent without significant stenosis.  There was a surprising amount of flow into the popliteal aneurysm in the native SFA and popliteal circulation despite the multiple previously placed  coils having been ligated distally.  Surprisingly, even with the catheter in the bypass graft, the flow is faster into the native popliteal aneurysm then through the bypass graft. The patient was systemically heparinized and a 5 French 70 cm sheath was then placed over the Magic torque wire and this was parked in the native mid SFA. I then used a pro-grate microcatheter and advanced the stent to the distal SFA at the site of the previous coils.  I elected to perform more coil embolization to reduce the flow into the popliteal  aneurysm itself.  A series of penumbra Ruby coils were used.  I used two 24 mm diameter and one 28 mm diameter standard coil in the distal SFA and up into the mid SFA.  I then used a 20 mm soft coil to pack this in more tightly as well as 3 of the penumbra PODS coils.  This resulted in a tight packing with minimal flow now seen into the popliteal aneurysm even with a injection through the sheath and the SFA. I elected to terminate the procedure. The sheath was removed and StarClose closure device was deployed in the right femoral artery with excellent hemostatic result. The patient was taken to the recovery room in stable condition having tolerated the procedure well.  Findings:                      Left Lower Extremity:  The common femoral artery was patent.  The profunda femoris artery was patent.  The bypass graft to the peroneal artery was widely patent without significant stenosis.  There was a surprising amount of flow into the popliteal aneurysm in the native SFA and popliteal circulation despite the multiple previously placed coils having been ligated distally.   Disposition: Patient was taken to the recovery room in stable condition having tolerated the procedure well.  Complications: None  Devin Washington 04/12/2018 12:52 PM   This note was created with Dragon Medical transcription system. Any errors in dictation are purely unintentional.

## 2018-04-12 NOTE — Progress Notes (Signed)
Dr. Lucky Cowboy informed of difficulty finding PD doppler pulse in right foot post procedures.   Still able to doppler PT, Dr. Lucky Cowboy states that is sufficient.

## 2018-04-13 ENCOUNTER — Encounter: Payer: Self-pay | Admitting: Vascular Surgery

## 2018-04-14 ENCOUNTER — Inpatient Hospital Stay: Payer: Medicare HMO | Attending: Oncology

## 2018-04-14 VITALS — BP 148/67 | HR 74 | Temp 97.0°F | Resp 18

## 2018-04-14 DIAGNOSIS — N189 Chronic kidney disease, unspecified: Secondary | ICD-10-CM | POA: Insufficient documentation

## 2018-04-14 DIAGNOSIS — D509 Iron deficiency anemia, unspecified: Secondary | ICD-10-CM

## 2018-04-14 DIAGNOSIS — E538 Deficiency of other specified B group vitamins: Secondary | ICD-10-CM | POA: Diagnosis not present

## 2018-04-14 DIAGNOSIS — D631 Anemia in chronic kidney disease: Secondary | ICD-10-CM | POA: Diagnosis not present

## 2018-04-14 DIAGNOSIS — Z87891 Personal history of nicotine dependence: Secondary | ICD-10-CM | POA: Diagnosis not present

## 2018-04-14 DIAGNOSIS — I129 Hypertensive chronic kidney disease with stage 1 through stage 4 chronic kidney disease, or unspecified chronic kidney disease: Secondary | ICD-10-CM | POA: Insufficient documentation

## 2018-04-14 MED ORDER — SODIUM CHLORIDE 0.9 % IV SOLN
Freq: Once | INTRAVENOUS | Status: AC
  Administered 2018-04-14: 14:00:00 via INTRAVENOUS
  Filled 2018-04-14: qty 1000

## 2018-04-14 MED ORDER — SODIUM CHLORIDE 0.9 % IV SOLN
510.0000 mg | Freq: Once | INTRAVENOUS | Status: AC
  Administered 2018-04-14: 510 mg via INTRAVENOUS
  Filled 2018-04-14 (×3): qty 17

## 2018-04-15 DIAGNOSIS — S0081XA Abrasion of other part of head, initial encounter: Secondary | ICD-10-CM | POA: Diagnosis not present

## 2018-04-16 ENCOUNTER — Inpatient Hospital Stay: Payer: Medicare HMO

## 2018-04-16 VITALS — BP 135/57 | HR 80

## 2018-04-16 DIAGNOSIS — D509 Iron deficiency anemia, unspecified: Secondary | ICD-10-CM

## 2018-04-16 MED ORDER — DARBEPOETIN ALFA 100 MCG/0.5ML IJ SOSY
100.0000 ug | PREFILLED_SYRINGE | INTRAMUSCULAR | Status: DC
  Administered 2018-04-16: 100 ug via SUBCUTANEOUS
  Filled 2018-04-16: qty 0.5

## 2018-04-22 DIAGNOSIS — D631 Anemia in chronic kidney disease: Secondary | ICD-10-CM | POA: Diagnosis not present

## 2018-04-22 DIAGNOSIS — N2581 Secondary hyperparathyroidism of renal origin: Secondary | ICD-10-CM | POA: Diagnosis not present

## 2018-04-22 DIAGNOSIS — I129 Hypertensive chronic kidney disease with stage 1 through stage 4 chronic kidney disease, or unspecified chronic kidney disease: Secondary | ICD-10-CM | POA: Diagnosis not present

## 2018-04-22 DIAGNOSIS — N183 Chronic kidney disease, stage 3 (moderate): Secondary | ICD-10-CM | POA: Diagnosis not present

## 2018-04-23 ENCOUNTER — Ambulatory Visit (INDEPENDENT_AMBULATORY_CARE_PROVIDER_SITE_OTHER): Payer: Medicare HMO | Admitting: Vascular Surgery

## 2018-04-23 ENCOUNTER — Encounter (INDEPENDENT_AMBULATORY_CARE_PROVIDER_SITE_OTHER): Payer: Medicare HMO

## 2018-04-26 DIAGNOSIS — I130 Hypertensive heart and chronic kidney disease with heart failure and stage 1 through stage 4 chronic kidney disease, or unspecified chronic kidney disease: Secondary | ICD-10-CM | POA: Diagnosis not present

## 2018-04-26 DIAGNOSIS — I5022 Chronic systolic (congestive) heart failure: Secondary | ICD-10-CM | POA: Diagnosis not present

## 2018-05-07 ENCOUNTER — Inpatient Hospital Stay (HOSPITAL_BASED_OUTPATIENT_CLINIC_OR_DEPARTMENT_OTHER): Payer: Medicare HMO | Admitting: Oncology

## 2018-05-07 ENCOUNTER — Inpatient Hospital Stay: Payer: Medicare HMO

## 2018-05-07 VITALS — BP 152/72 | HR 59

## 2018-05-07 DIAGNOSIS — D509 Iron deficiency anemia, unspecified: Secondary | ICD-10-CM

## 2018-05-07 DIAGNOSIS — I129 Hypertensive chronic kidney disease with stage 1 through stage 4 chronic kidney disease, or unspecified chronic kidney disease: Secondary | ICD-10-CM

## 2018-05-07 DIAGNOSIS — Z87891 Personal history of nicotine dependence: Secondary | ICD-10-CM

## 2018-05-07 DIAGNOSIS — E538 Deficiency of other specified B group vitamins: Secondary | ICD-10-CM | POA: Diagnosis not present

## 2018-05-07 DIAGNOSIS — D631 Anemia in chronic kidney disease: Secondary | ICD-10-CM

## 2018-05-07 DIAGNOSIS — N189 Chronic kidney disease, unspecified: Secondary | ICD-10-CM

## 2018-05-07 LAB — CBC WITH DIFFERENTIAL/PLATELET
BASOS PCT: 1 %
Basophils Absolute: 0.1 10*3/uL (ref 0–0.1)
EOS ABS: 0.1 10*3/uL (ref 0–0.7)
Eosinophils Relative: 1 %
HEMATOCRIT: 28.8 % — AB (ref 40.0–52.0)
HEMOGLOBIN: 9.7 g/dL — AB (ref 13.0–18.0)
Lymphocytes Relative: 12 %
Lymphs Abs: 1 10*3/uL (ref 1.0–3.6)
MCH: 28.5 pg (ref 26.0–34.0)
MCHC: 33.6 g/dL (ref 32.0–36.0)
MCV: 84.8 fL (ref 80.0–100.0)
Monocytes Absolute: 0.6 10*3/uL (ref 0.2–1.0)
Monocytes Relative: 8 %
NEUTROS ABS: 6.4 10*3/uL (ref 1.4–6.5)
NEUTROS PCT: 78 %
Platelets: 222 10*3/uL (ref 150–440)
RBC: 3.39 MIL/uL — AB (ref 4.40–5.90)
RDW: 21 % — ABNORMAL HIGH (ref 11.5–14.5)
WBC: 8.1 10*3/uL (ref 3.8–10.6)

## 2018-05-07 LAB — BASIC METABOLIC PANEL
ANION GAP: 7 (ref 5–15)
BUN: 22 mg/dL — ABNORMAL HIGH (ref 6–20)
CHLORIDE: 105 mmol/L (ref 101–111)
CO2: 25 mmol/L (ref 22–32)
CREATININE: 1.55 mg/dL — AB (ref 0.61–1.24)
Calcium: 9.5 mg/dL (ref 8.9–10.3)
GFR calc non Af Amer: 37 mL/min — ABNORMAL LOW (ref >60)
GFR, EST AFRICAN AMERICAN: 43 mL/min — AB (ref >60)
Glucose, Bld: 111 mg/dL — ABNORMAL HIGH (ref 65–99)
POTASSIUM: 4.2 mmol/L (ref 3.5–5.1)
SODIUM: 137 mmol/L (ref 135–145)

## 2018-05-07 MED ORDER — DARBEPOETIN ALFA 100 MCG/0.5ML IJ SOSY
100.0000 ug | PREFILLED_SYRINGE | INTRAMUSCULAR | Status: DC
  Administered 2018-05-07: 100 ug via SUBCUTANEOUS
  Filled 2018-05-07: qty 0.5

## 2018-05-07 NOTE — Progress Notes (Signed)
Aranesp order is for every 4 weeks.  Last received 3 weeks ago and Sonia Baller B, NP approves to proceed with injection today.

## 2018-05-07 NOTE — Progress Notes (Signed)
Hematology/Oncology Consult note [facility]  [phone] [phone]  Patient Care Team: Albina Billet, MD as PCP - General (Internal Medicine)   Name of the patient: Devin Washington  [id number]  Sep 26, 1923   Date of visit: 05/07/18  Diagnosis- anemia likely multifactorial- iron and b12 deficiency and anemia of chronic kidney disease  Chief complaint/ Reason for visit- Follow-up for anemia and monthly Aransesp  Heme/Onc history: Patient was last seen by primary medical oncologist Dr. Janese Banks on 04/09/2018 after patient had just been discharged from the hospital for symptomatic anemia.  He was doing well at that visit.  Continued to report some mild fatigue and discomfort over chest wall from recent fall.   He was seen by Dr. 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  Had a angiography and attempt to salvage the leg.  He has follow-up in the next coming weeks from this procedure.  Patient was admitted to the hospital on 04/01/18 due to a fall and was found to have a hemoglobin of 7.1.  He received 1 unit of packed red blood cells in the ER.  Had EGD and colonoscopy on 04/04/2017.  EGD showed nonobstructing nonbleeding superficial duodenal ulcers with clean ulcer base.  Nonbleeding erosions in the gastric antrum and no stigmata of recent bleeding.  Duodenum was suspicious for H. pylori.  Colonoscopy showed hemorrhoids.  Interval history- patient continues to do relatively well.  He continues to complain of fatigue.  Chest wall pain has resolved from fall.  He has not  had any recent falls.  ECOG PS- 2 Pain scale- 0   Review of systems- Review of Systems  Constitutional: Positive for malaise/fatigue. Negative for chills, fever and weight loss.  HENT: Negative for congestion and ear pain.   Eyes: Negative.  Negative for blurred vision and double vision.    Respiratory: Negative.  Negative for cough, sputum production and shortness of breath.   Cardiovascular: Negative.  Negative for chest pain, palpitations and leg swelling.  Gastrointestinal: Negative.  Negative for abdominal pain, constipation, diarrhea, nausea and vomiting.  Genitourinary: Negative for dysuria, frequency and urgency.  Musculoskeletal: Negative for back pain and falls.  Skin: Negative.  Negative for rash.  Neurological: Positive for weakness (Chronic). Negative for headaches.  Endo/Heme/Allergies: Negative.  Does not bruise/bleed easily.  Psychiatric/Behavioral: Negative.  Negative for depression. The patient is not nervous/anxious and does not have insomnia.       No Known Allergies   Past Medical History:  Diagnosis Date  . A-fib ([place])   . Cardiomyopathy ([place])   . CHF (congestive heart failure) ([place])   . DJD (degenerative joint disease)   . Duodenal ulcer 08/09/2015  . Dysrhythmia   . Erosive esophagitis   . HH (hiatus hernia)   . HLD (hyperlipidemia)   . Hypertension   . Pacemaker   . PAD (peripheral artery disease) ([place])   . Peripheral vascular disease ([place])   . Presence of permanent cardiac pacemaker   . Prostate cancer [facility])    Prostatectomy.  . 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 Shortness of breath   . SSS (sick sinus syndrome) [facility])      Past Surgical History:  Procedure Laterality Date  . CATARACT EXTRACTION, BILATERAL    . COLONOSCOPY N/A 04/04/2018   Procedure: COLONOSCOPY;  Surgeon: Lin Landsman, MD;  Location: [facility] ENDOSCOPY;  Service: Gastroenterology;  Laterality: N/A;  .  ESOPHAGOGASTRODUODENOSCOPY N/A 08/09/2015   Procedure: ESOPHAGOGASTRODUODENOSCOPY (EGD) looking in the stomach with a lighted tube to evaluate and treat;  Surgeon: Lollie Sails, MD;  Location: [facility] ENDOSCOPY;  Service: Endoscopy;  Laterality: N/A;  Procedure to be done tomorrow afternoon, 08/08/2015. Awaiting cardiology consult  . ESOPHAGOGASTRODUODENOSCOPY N/A  04/04/2018   Procedure: ESOPHAGOGASTRODUODENOSCOPY (EGD);  Surgeon: Lin Landsman, MD;  Location: [facility] ENDOSCOPY;  Service: Gastroenterology;  Laterality: N/A;  . EYE SURGERY    . FEMORAL-POPLITEAL BYPASS GRAFT Left 12/16/2017   Procedure: BYPASS GRAFT FEMORAL-POPLITEAL ARTERY ( OPEN POPLITEAL BYPASS );  Surgeon: Algernon Huxley, MD;  Location: [facility] ORS;  Service: Vascular;  Laterality: Left;  . FLEXIBLE SIGMOIDOSCOPY N/A 08/09/2015   Procedure: FLEXIBLE SIGMOIDOSCOPY looking up the rectum into the distal colon with a lighted tube to examine and treat;  Surgeon: Lollie Sails, MD;  Location: [facility] ENDOSCOPY;  Service: Endoscopy;  Laterality: N/A;  Procedure to be done tomorrow afternoon, 08/08/2015. Awaiting cardiology consult.  . INSERT / REPLACE / REMOVE PACEMAKER    . JOINT REPLACEMENT Left    left knee replacement  . LOWER EXTREMITY ANGIOGRAPHY Left 11/16/2017   Procedure: LOWER EXTREMITY ANGIOGRAPHY;  Surgeon: Algernon Huxley, MD;  Location: [place] CV LAB;  Service: Cardiovascular;  Laterality: Left;  . LOWER EXTREMITY ANGIOGRAPHY Right 11/30/2017   Procedure: LOWER EXTREMITY ANGIOGRAPHY;  Surgeon: Algernon Huxley, MD;  Location: [place] CV LAB;  Service: Cardiovascular;  Laterality: Right;  . LOWER EXTREMITY ANGIOGRAPHY Left 03/04/2018   Procedure: LOWER EXTREMITY ANGIOGRAPHY;  Surgeon: Algernon Huxley, MD;  Location: [place] CV LAB;  Service: Cardiovascular;  Laterality: Left;  . LOWER EXTREMITY ANGIOGRAPHY Left 04/12/2018   Procedure: LOWER EXTREMITY ANGIOGRAPHY;  Surgeon: Algernon Huxley, MD;  Location: [place] CV LAB;  Service: Cardiovascular;  Laterality: Left;  . PACEMAKER INSERTION    . PROSTATE SURGERY    . PROSTATE SURGERY      Social History   Socioeconomic History  . Marital status: Widowed    Spouse name: Not on file  . Number of children: Not on file  . Years of education: Not on file  . Highest education level: Not on file  Occupational History  . Not  on file  Social Needs  . Financial resource strain: Not on file  . Food insecurity:    Worry: Not on file    Inability: Not on file  . Transportation needs:    Medical: Not on file    Non-medical: Not on file  Tobacco Use  . Smoking status: Former Smoker    Last attempt to quit: 03/02/1958    Years since quitting: 60.2  . Smokeless tobacco: Former Systems developer    Types: Chew, Snuff  Substance and Sexual Activity  . Alcohol use: No    Alcohol/week: 0.0 oz  . Drug use: No  . Sexual activity: Not Currently  Lifestyle  . Physical activity:    Days per week: Not on file    Minutes per session: Not on file  . Stress: Not on file  Relationships  . Social connections:    Talks on phone: Not on file    Gets together: Not on file    Attends religious service: Not on file    Active member of club or organization: Not on file    Attends meetings of clubs or organizations: Not on file    Relationship status: Not on file  . Intimate partner violence:    Fear  of current or ex partner: Not on file    Emotionally abused: Not on file    Physically abused: Not on file    Forced sexual activity: Not on file  Other Topics Concern  . Not on file  Social History Narrative  . Not on file    Family History  Problem Relation Age of Onset  . Lung cancer Father      Current Outpatient Medications:  .  acetaminophen (TYLENOL) 325 MG tablet, Take 2 tablets (650 mg total) by mouth every 6 (six) hours as needed for mild pain (or Fever >/= 101). (Patient not taking: Reported on 04/09/2018), Disp: , Rfl:  .  albuterol (PROVENTIL HFA;VENTOLIN HFA) 108 (90 Base) MCG/ACT inhaler, Inhale 2 puffs into the lungs every 6 (six) hours as needed for wheezing or shortness of breath. (Patient not taking: Reported on 04/09/2018), Disp: 1 Inhaler, Rfl: 2 .  ANORO ELLIPTA 62.5-25 MCG/INH AEPB, Inhale 1 puff into the lungs 2 (two) times daily., Disp: , Rfl:  .  aspirin EC 81 MG EC tablet, Take 1 tablet (81 mg total) by  mouth daily., Disp: 30 tablet, Rfl: 3 .  benazepril (LOTENSIN) 20 MG tablet, Take 1 tablet (20 mg total) by mouth daily. (Patient not taking: Reported on 04/09/2018), Disp: 30 tablet, Rfl: 0 .  docusate sodium (COLACE) 100 MG capsule, Take 1 capsule (100 mg total) by mouth daily., Disp: 10 capsule, Rfl: 0 .  feeding supplement, ENSURE ENLIVE, (ENSURE ENLIVE) LIQD, Take 237 mLs by mouth 2 (two) times daily between meals., Disp: 60 Bottle, Rfl: 1 .  ferrous sulfate 325 (65 FE) MG tablet, Take 1 tablet (325 mg total) by mouth 2 (two) times daily with a meal., Disp: 60 tablet, Rfl: 3 .  furosemide (LASIX) 20 MG tablet, Take 1 tablet by mouth daily., Disp: , Rfl:  .  metoprolol tartrate (LOPRESSOR) 25 MG tablet, Take 1 tablet (25 mg total) by mouth 2 (two) times daily., Disp: 60 tablet, Rfl: 0 .  pantoprazole (PROTONIX) 40 MG tablet, Take 1 tablet (40 mg total) by mouth daily., Disp: 30 tablet, Rfl: 0 .  simvastatin (ZOCOR) 20 MG tablet, Take 40 mg by mouth daily. , Disp: , Rfl:  .  traMADol (ULTRAM) 50 MG tablet, Take 1 tablet (50 mg total) by mouth every 6 (six) hours as needed for moderate pain or severe pain. (Patient not taking: Reported on 04/09/2018), Disp: 20 tablet, Rfl: 0 .  traZODone (DESYREL) 50 MG tablet, Take 1 tablet by mouth at bedtime as needed for sleep. , Disp: , Rfl:  .  vitamin B-12 1000 MCG tablet, Take 1 tablet (1,000 mcg total) by mouth daily., Disp: 60 tablet, Rfl: 1 No current facility-administered medications for this visit.   Facility-Administered Medications Ordered in Other Visits:  .  Darbepoetin Alfa (ARANESP) injection 100 mcg, 100 mcg, Subcutaneous, Q28 days, Sindy Guadeloupe, MD, 100 mcg at 04/16/18 1505 .  Darbepoetin Alfa (ARANESP) injection 100 mcg, 100 mcg, Subcutaneous, Q28 days, Sindy Guadeloupe, MD, 100 mcg at 05/07/18 1146  Physical exam:  Vitals:   05/07/18 1047  BP: (!) 152/72  Pulse: (!) 59   Physical Exam  Constitutional: He is oriented to person, place,  and time. Vital signs are normal.  HENT:  Head: Normocephalic and atraumatic.  Eyes: Pupils are equal, round, and reactive to light.  Neck: Normal range of motion.  Cardiovascular: Normal rate, regular rhythm and normal heart sounds.  No murmur heard. Pulmonary/Chest: Effort normal  and breath sounds normal. He has no wheezes.  Abdominal: Soft. Normal appearance and bowel sounds are normal. He exhibits no distension. There is no tenderness.  Musculoskeletal: Normal range of motion. He exhibits edema (Mild swelling in left ankle d/t recent surgery by Dr. dew ).  Neurological: He is alert and oriented to person, place, and time.  Skin: Skin is warm and dry. No rash noted.  Psychiatric: Judgment normal.     CMP Latest Ref Rng & Units 05/07/2018  Glucose 65 - 99 mg/dL 111(H)  BUN 6 - 20 mg/dL 22(H)  Creatinine 0.61 - 1.24 mg/dL 1.55(H)  Sodium 135 - 145 mmol/L 137  Potassium 3.5 - 5.1 mmol/L 4.2  Chloride 101 - 111 mmol/L 105  CO2 22 - 32 mmol/L 25  Calcium 8.9 - 10.3 mg/dL 9.5  Total Protein 6.5 - 8.1 g/dL -  Total Bilirubin 0.3 - 1.2 mg/dL -  Alkaline Phos 38 - 126 U/L -  AST 15 - 41 U/L -  ALT 17 - 63 U/L -   CBC Latest Ref Rng & Units 05/07/2018  WBC 3.8 - 10.6 K/uL 8.1  Hemoglobin 13.0 - 18.0 g/dL 9.7(L)  Hematocrit 40.0 - 52.0 % 28.8(L)  Platelets 150 - 440 K/uL 222    No images are attached to the encounter.  No results found.   Assessment and plan- Patient is a 84 y.o. male with normocytic anemia likely multifactorial secondary to iron and B12 deficiency as well as anemia of chronic kidney disease  Today patient's hemoglobin is 9.7.  He continues oral B12.  He has received a total of 2 doses of IV Feraheme (4/23 and 5/1).    Plan for patient is to receive monthly Aranesp.  Per Dr. Elroy Channel note, patient to receive Aranesp for hemoglobin < 10.  Patient will receive Aranesp today (hemoglobin 9.7).  He will return to clinic in 1 month for labs, MD assessment with possible  Aranesp injection.  Patient in agreement with plan.  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  Continue to monitor this conservatively.   Visit Diagnosis 1. Anemia in chronic kidney disease, unspecified CKD stage      Dr. Randa Evens, MD, MPH [facility] [phone] 05/07/2018 1:58 PM

## 2018-05-11 ENCOUNTER — Encounter (INDEPENDENT_AMBULATORY_CARE_PROVIDER_SITE_OTHER): Payer: Self-pay | Admitting: Vascular Surgery

## 2018-05-11 ENCOUNTER — Ambulatory Visit (INDEPENDENT_AMBULATORY_CARE_PROVIDER_SITE_OTHER): Payer: Medicare HMO | Admitting: Vascular Surgery

## 2018-05-11 VITALS — BP 169/74 | HR 67 | Resp 15 | Ht 70.0 in | Wt 174.0 lb

## 2018-05-11 DIAGNOSIS — I724 Aneurysm of artery of lower extremity: Secondary | ICD-10-CM | POA: Diagnosis not present

## 2018-05-11 DIAGNOSIS — I723 Aneurysm of iliac artery: Secondary | ICD-10-CM

## 2018-05-11 DIAGNOSIS — N183 Chronic kidney disease, stage 3 unspecified: Secondary | ICD-10-CM

## 2018-05-11 DIAGNOSIS — I10 Essential (primary) hypertension: Secondary | ICD-10-CM

## 2018-05-11 NOTE — Progress Notes (Signed)
MRN : [id number]  Devin Washington is a 84 y.o. (1923-12-05) male who presents with chief complaint of  Chief Complaint  Patient presents with  . Follow-up    4 week [facility] f/u  .  History of Present Illness: Patient returns today in follow up.  His leg is doing much better.  The swelling has gone way down.  His perfusion remains stable.  He is walking better without as many issues.  He had no periprocedural complications after his last angiogram with embolization.  Current Outpatient Medications  Medication Sig Dispense Refill  . acetaminophen (TYLENOL) 325 MG tablet Take 2 tablets (650 mg total) by mouth every 6 (six) hours as needed for mild pain (or Fever >/= 101). (Patient not taking: Reported on 04/09/2018)    . albuterol (PROVENTIL HFA;VENTOLIN HFA) 108 (90 Base) MCG/ACT inhaler Inhale 2 puffs into the lungs every 6 (six) hours as needed for wheezing or shortness of breath. (Patient not taking: Reported on 04/09/2018) 1 Inhaler 2  . ANORO ELLIPTA 62.5-25 MCG/INH AEPB Inhale 1 puff into the lungs 2 (two) times daily.    Marland Kitchen aspirin EC 81 MG EC tablet Take 1 tablet (81 mg total) by mouth daily. 30 tablet 3  . benazepril (LOTENSIN) 20 MG tablet Take 1 tablet (20 mg total) by mouth daily. (Patient not taking: Reported on 04/09/2018) 30 tablet 0  . docusate sodium (COLACE) 100 MG capsule Take 1 capsule (100 mg total) by mouth daily. 10 capsule 0  . feeding supplement, ENSURE ENLIVE, (ENSURE ENLIVE) LIQD Take 237 mLs by mouth 2 (two) times daily between meals. 60 Bottle 1  . ferrous sulfate 325 (65 FE) MG tablet Take 1 tablet (325 mg total) by mouth 2 (two) times daily with a meal. 60 tablet 3  . furosemide (LASIX) 20 MG tablet Take 1 tablet by mouth daily.    . metoprolol tartrate (LOPRESSOR) 25 MG tablet Take 1 tablet (25 mg total) by mouth 2 (two) times daily. 60 tablet 0  . pantoprazole (PROTONIX) 40 MG tablet Take 1 tablet (40 mg total) by mouth daily. 30 tablet 0  . simvastatin (ZOCOR) 20  MG tablet Take 40 mg by mouth daily.     . traMADol (ULTRAM) 50 MG tablet Take 1 tablet (50 mg total) by mouth every 6 (six) hours as needed for moderate pain or severe pain. (Patient not taking: Reported on 04/09/2018) 20 tablet 0  . traZODone (DESYREL) 50 MG tablet Take 1 tablet by mouth at bedtime as needed for sleep.     . vitamin B-12 1000 MCG tablet Take 1 tablet (1,000 mcg total) by mouth daily. 60 tablet 1   No current facility-administered medications for this visit.    Facility-Administered Medications Ordered in Other Visits  Medication Dose Route Frequency Provider Last Rate Last Dose  . Darbepoetin Alfa (ARANESP) injection 100 mcg  100 mcg Subcutaneous Q28 days Sindy Guadeloupe, MD   100 mcg at 04/16/18 1505    Past Medical History:  Diagnosis Date  . A-fib ([place])   . Cardiomyopathy ([place])   . CHF (congestive heart failure) ([place])   . DJD (degenerative joint disease)   . Duodenal ulcer 08/09/2015  . Dysrhythmia   . Erosive esophagitis   . HH (hiatus hernia)   . HLD (hyperlipidemia)   . Hypertension   . Pacemaker   . PAD (peripheral artery disease) ([place])   . Peripheral vascular disease ([place])   . Presence of permanent cardiac pacemaker   .  Prostate cancer [facility]    Prostatectomy.  . 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 Shortness of breath   . SSS (sick sinus syndrome) [facility])     Past Surgical History:  Procedure Laterality Date  . CATARACT EXTRACTION, BILATERAL    . COLONOSCOPY N/A 04/04/2018   Procedure: COLONOSCOPY;  Surgeon: Lin Landsman, MD;  Location: [facility] ENDOSCOPY;  Service: Gastroenterology;  Laterality: N/A;  . ESOPHAGOGASTRODUODENOSCOPY N/A 08/09/2015   Procedure: ESOPHAGOGASTRODUODENOSCOPY (EGD) looking in the stomach with a lighted tube to evaluate and treat;  Surgeon: Lollie Sails, MD;  Location: [facility] ENDOSCOPY;  Service: Endoscopy;  Laterality: N/A;  Procedure to be done tomorrow afternoon, 08/08/2015. Awaiting cardiology consult  .  ESOPHAGOGASTRODUODENOSCOPY N/A 04/04/2018   Procedure: ESOPHAGOGASTRODUODENOSCOPY (EGD);  Surgeon: Lin Landsman, MD;  Location: [facility] ENDOSCOPY;  Service: Gastroenterology;  Laterality: N/A;  . EYE SURGERY    . FEMORAL-POPLITEAL BYPASS GRAFT Left 12/16/2017   Procedure: BYPASS GRAFT FEMORAL-POPLITEAL ARTERY ( OPEN POPLITEAL BYPASS );  Surgeon: Algernon Huxley, MD;  Location: [facility] ORS;  Service: Vascular;  Laterality: Left;  . FLEXIBLE SIGMOIDOSCOPY N/A 08/09/2015   Procedure: FLEXIBLE SIGMOIDOSCOPY looking up the rectum into the distal colon with a lighted tube to examine and treat;  Surgeon: Lollie Sails, MD;  Location: [facility] ENDOSCOPY;  Service: Endoscopy;  Laterality: N/A;  Procedure to be done tomorrow afternoon, 08/08/2015. Awaiting cardiology consult.  . INSERT / REPLACE / REMOVE PACEMAKER    . JOINT REPLACEMENT Left    left knee replacement  . LOWER EXTREMITY ANGIOGRAPHY Left 11/16/2017   Procedure: LOWER EXTREMITY ANGIOGRAPHY;  Surgeon: Algernon Huxley, MD;  Location: [place] CV LAB;  Service: Cardiovascular;  Laterality: Left;  . LOWER EXTREMITY ANGIOGRAPHY Right 11/30/2017   Procedure: LOWER EXTREMITY ANGIOGRAPHY;  Surgeon: Algernon Huxley, MD;  Location: [place] CV LAB;  Service: Cardiovascular;  Laterality: Right;  . LOWER EXTREMITY ANGIOGRAPHY Left 03/04/2018   Procedure: LOWER EXTREMITY ANGIOGRAPHY;  Surgeon: Algernon Huxley, MD;  Location: [place] CV LAB;  Service: Cardiovascular;  Laterality: Left;  . LOWER EXTREMITY ANGIOGRAPHY Left 04/12/2018   Procedure: LOWER EXTREMITY ANGIOGRAPHY;  Surgeon: Algernon Huxley, MD;  Location: [place] CV LAB;  Service: Cardiovascular;  Laterality: Left;  . PACEMAKER INSERTION    . PROSTATE SURGERY    . PROSTATE SURGERY      Social History Social History   Tobacco Use  . Smoking status: Former Smoker    Last attempt to quit: 03/02/1958    Years since quitting: 60.2  . Smokeless tobacco: Former Systems developer    Types: Chew, Snuff    Substance Use Topics  . Alcohol use: No    Alcohol/week: 0.0 oz  . Drug use: No    Family History Family History  Problem Relation Age of Onset  . Lung cancer Father     No Known Allergies   REVIEW OF SYSTEMS (Negative unless checked)  Constitutional: _0 Weight loss  _1 Fever  _2 Chills Cardiac: _3 Chest pain   _4 Chest pressure   _5 Palpitations   _6 Shortness of breath when laying flat   _7 Shortness of breath at rest   _8 Shortness of breath with exertion. Vascular:  _9 Pain in legs with walking   _10 Pain in legs at rest   _11 Pain in legs when laying flat   _12 Claudication   _13 Pain in feet when walking  _14 Pain in feet at rest  _15 Pain in feet when laying flat   _16 History of DVT   _17 Phlebitis   _18 Swelling in legs   _19   Varicose veins   _0 Non-healing ulcers Pulmonary:   _1 Uses home oxygen   _2 Productive cough   _3 Hemoptysis   _4 Wheeze  _5 COPD   _6 Asthma Neurologic:  _7 Dizziness  _8 Blackouts   _9 Seizures   _10 History of stroke   _11 History of TIA  _12 Aphasia   _13 Temporary blindness   _14 Dysphagia   _15 Weakness or numbness in arms   _16 Weakness or numbness in legs Musculoskeletal:  _17 Arthritis   _18 Joint swelling   _19 Joint pain   _20 Low back pain Hematologic:  _21 Easy bruising  _22 Easy bleeding   _23 Hypercoagulable state   _24 Anemic   Gastrointestinal:  _25 Blood in stool   _26 Vomiting blood  _27 Gastroesophageal reflux/heartburn   _28 Abdominal pain Genitourinary:  _29 Chronic kidney disease   _30 Difficult urination  _31 Frequent urination  _32 Burning with urination   _33 Hematuria Skin:  _34 Rashes   _35 Ulcers   _36 Wounds Psychological:  _37 History of anxiety   _38  History of major depression.  Physical Examination  BP (!) 169/74 (BP Location: Right Arm, Patient Position: Sitting)   Pulse 67   Resp 15   Ht _39  (1.778 m)   Wt 174 lb (78.9 kg)   BMI 24.97 kg/m  Gen:  WD/WN, NAD.  Appears far younger than stated age Head: [place]/AT, No temporalis wasting. Ear/Nose/Throat: Hearing somewhat diminished, nares w/o erythema or  drainage Eyes: Conjunctiva clear. Sclera non-icteric Neck: Supple.  Trachea midline Pulmonary:  Good air movement, no use of accessory muscles.  Cardiac: irregular Vascular:  Vessel Right Left  Radial Palpable Palpable                          PT  not palpable  not palpable  DP  1+ palpable  trace palpable    Musculoskeletal: M/S 5/5 throughout.  No deformity or atrophy.  Trace right lower extremity edema, significant improvement in the left lower extremity now with 1-2+ edema. Neurologic: Sensation grossly intact in extremities.  Symmetrical.  Speech is fluent.  Psychiatric: Judgment intact, Mood & affect appropriate for pt's clinical situation. Dermatologic: No rashes or ulcers noted.  No cellulitis or open wounds.       Labs Recent Results (from the past 2160 hour(s))  BUN     Status: Abnormal   Collection Time: 03/02/18  9:23 AM  Result Value Ref Range   BUN 22 (H) 6 - 20 mg/dL    Comment: Performed at [facility], [address] [place], [place] [address]  Creatinine, serum     Status: Abnormal   Collection Time: 03/02/18  9:23 AM  Result Value Ref Range   Creatinine, Ser 1.56 (H) 0.61 - 1.24 mg/dL   GFR calc non Af Amer 37 (L) >60 mL/min   GFR calc Af Amer 43 (L) >60 mL/min    Comment: (NOTE) The eGFR has been calculated using the CKD EPI equation. This calculation has not been validated in all clinical situations. eGFR's persistently <60 mL/min signify possible Chronic Kidney Disease. Performed at [facility], [address] [place], [name] [address]   CBC with Differential/Platelet     Status: Abnormal   Collection Time: 04/01/18  6:17 PM  Result Value Ref Range   WBC 12.1 (H) 3.8 - 10.6 K/uL   RBC 2.78 (L) 4.40 - 5.90 MIL/uL   Hemoglobin 7.1 (L) 13.0 - 18.0 g/dL   HCT 22.2 (L) 40.0 - 52.0 %   MCV 80.1 80.0 - 100.0 fL   MCH 25.5 (L) 26.0 - 34.0 pg   MCHC  31.8 (L) 32.0 - 36.0 g/dL   RDW 18.1 (H) 11.5 - 14.5 %   Platelets 268  150 - 440 K/uL   Neutrophils Relative % 81 %   Neutro Abs 9.8 (H) 1.4 - 6.5 K/uL   Lymphocytes Relative 10 %   Lymphs Abs 1.2 1.0 - 3.6 K/uL   Monocytes Relative 6 %   Monocytes Absolute 0.7 0.2 - 1.0 K/uL   Eosinophils Relative 2 %   Eosinophils Absolute 0.3 0 - 0.7 K/uL   Basophils Relative 1 %   Basophils Absolute 0.1 0 - 0.1 K/uL    Comment: Performed at [facility], [address] [place], [place] [address]  Prepare RBC     Status: None   Collection Time: 04/01/18  9:31 PM  Result Value Ref Range   Order Confirmation      ORDER PROCESSED BY BLOOD BANK Performed at [facility], [address], [place], [place] [address]   Basic metabolic panel     Status: Abnormal   Collection Time: 04/01/18  9:34 PM  Result Value Ref Range   Sodium 136 135 - 145 mmol/L   Potassium 4.2 3.5 - 5.1 mmol/L   Chloride 104 101 - 111 mmol/L   CO2 25 22 - 32 mmol/L   Glucose, Bld 197 (H) 65 - 99 mg/dL   BUN 27 (H) 6 - 20 mg/dL   Creatinine, Ser 1.90 (H) 0.61 - 1.24 mg/dL   Calcium 8.8 (L) 8.9 - 10.3 mg/dL   GFR calc non Af Amer 29 (L) >60 mL/min   GFR calc Af Amer 34 (L) >60 mL/min    Comment: (NOTE) The eGFR has been calculated using the CKD EPI equation. This calculation has not been validated in all clinical situations. eGFR's persistently <60 mL/min signify possible Chronic Kidney Disease.    Anion gap 7 5 - 15    Comment: Performed at [facility], [address] [place], [place] [address]  TSH     Status: None   Collection Time: 04/01/18  9:34 PM  Result Value Ref Range   TSH 0.529 0.350 - 4.500 uIU/mL    Comment: Performed by a 3rd Generation assay with a functional sensitivity of <=0.01 uIU/mL. Performed at [facility], [address] [place], [place] [address]   Type and screen [place]     Status: None   Collection Time: 04/01/18 11:01 PM  Result Value Ref Range   ABO/RH(D) O POS    Antibody Screen NEG     Sample Expiration 04/04/2018    Unit Number [id number]    Blood Component Type RED CELLS,LR    Unit division 00    Status of Unit ISSUED,FINAL    Transfusion Status OK TO TRANSFUSE    Crossmatch Result      Compatible Performed at [facility], [address] [place], [place] [address]   BPAM RBC     Status: None   Collection Time: 04/01/18 11:01 PM  Result Value Ref Range   ISSUE DATE / TIME [id number]    Blood Product Unit Number [id number]    PRODUCT CODE [id number]    Unit Type and Rh 5100    Blood Product Expiration Date [id number]   CBC     Status: Abnormal   Collection Time: 04/02/18  8:21 AM  Result Value Ref Range   WBC 10.6 3.8 - 10.6 K/uL   RBC 3.06 (L) 4.40 - 5.90 MIL/uL  Hemoglobin 8.2 (L) 13.0 - 18.0 g/dL   HCT 24.8 (L) 40.0 - 52.0 %   MCV 81.2 80.0 - 100.0 fL   MCH 26.8 26.0 - 34.0 pg   MCHC 33.1 32.0 - 36.0 g/dL   RDW 18.3 (H) 11.5 - 14.5 %   Platelets 192 150 - 440 K/uL    Comment: Performed at [facility], [address] [place], [place] [address]  Basic metabolic panel     Status: Abnormal   Collection Time: 04/03/18  5:20 AM  Result Value Ref Range   Sodium 140 135 - 145 mmol/L    Comment: LYTES REPEATED  MLK   Potassium 4.2 3.5 - 5.1 mmol/L   Chloride 109 101 - 111 mmol/L   CO2 29 22 - 32 mmol/L   Glucose, Bld 97 65 - 99 mg/dL   BUN 24 (H) 6 - 20 mg/dL   Creatinine, Ser 1.63 (H) 0.61 - 1.24 mg/dL   Calcium 8.7 (L) 8.9 - 10.3 mg/dL   GFR calc non Af Amer 35 (L) >60 mL/min   GFR calc Af Amer 41 (L) >60 mL/min    Comment: (NOTE) The eGFR has been calculated using the CKD EPI equation. This calculation has not been validated in all clinical situations. eGFR's persistently <60 mL/min signify possible Chronic Kidney Disease.    Anion gap 2 (L) 5 - 15    Comment: Performed at [facility], [address] [place], [place] [address]  Hemoglobin and hematocrit, blood     Status: Abnormal   Collection Time:  04/03/18  5:20 AM  Result Value Ref Range   Hemoglobin 7.4 (L) 13.0 - 18.0 g/dL   HCT 22.7 (L) 40.0 - 52.0 %    Comment: Performed at [facility], [address], [place], [place] [address]  Ferritin     Status: None   Collection Time: 04/03/18  5:20 AM  Result Value Ref Range   Ferritin 152 24 - 336 ng/mL    Comment: Performed at [facility], [address] [place], [place] [address]  Iron and TIBC     Status: Abnormal   Collection Time: 04/03/18  5:20 AM  Result Value Ref Range   Iron 27 (L) 45 - 182 ug/dL   TIBC 229 (L) 250 - 450 ug/dL   Saturation Ratios 12 (L) 17.9 - 39.5 %   UIBC 202 ug/dL    Comment: Performed at [facility], [address] [place], [place] [address]  Vitamin B12     Status: None   Collection Time: 04/03/18  5:20 AM  Result Value Ref Range   Vitamin B-12 264 180 - 914 pg/mL    Comment: (NOTE) This assay is not validated for testing neonatal or myeloproliferative syndrome specimens for Vitamin B12 levels. Performed at [facility], [place] [address], [place], [place] [address]   Folate     Status: None   Collection Time: 04/03/18  5:20 AM  Result Value Ref Range   Folate 17.5 >5.9 ng/mL    Comment: Performed at [facility], [address] [place],  [address]  Reticulocytes     Status: Abnormal   Collection Time: 04/03/18  5:20 AM  Result Value Ref Range   Retic Ct Pct 2.0 0.4 - 3.1 %   RBC. 2.82 (L) 4.40 - 5.90 MIL/uL   Retic Count, Absolute 56.4 19.0 - 183.0 K/uL    Comment: Performed at [facility], [address] [place], [place] [address]  Hemoglobin and hematocrit, blood     Status: Abnormal   Collection Time: 04/03/18  2:35 PM  Result Value Ref Range   Hemoglobin 8.2 (L) 13.0 - 18.0 g/dL   HCT 24.7 (L) 40.0 - 52.0 %    Comment: Performed at [facility], [address] [place], [place] [address]  Hemoglobin and hematocrit, blood     Status: Abnormal   Collection  Time: 04/03/18  7:36 PM  Result Value Ref Range   Hemoglobin 8.2 (L) 13.0 - 18.0 g/dL   HCT 25.4 (L) 40.0 - 52.0 %    Comment: Performed at [facility], [address] [place], [place] [address]  CBC     Status: Abnormal   Collection Time: 04/04/18  1:51 AM  Result Value Ref Range   WBC 14.7 (H) 3.8 - 10.6 K/uL   RBC 2.95 (L) 4.40 - 5.90 MIL/uL   Hemoglobin 7.8 (L) 13.0 - 18.0 g/dL   HCT 24.0 (L) 40.0 - 52.0 %   MCV 81.3 80.0 - 100.0 fL   MCH 26.4 26.0 - 34.0 pg   MCHC 32.5 32.0 - 36.0 g/dL   RDW 19.1 (H) 11.5 - 14.5 %   Platelets 205 150 - 440 K/uL    Comment: Performed at [facility], [address] [place], [place] [address]  Basic metabolic panel     Status: Abnormal   Collection Time: 04/04/18  1:51 AM  Result Value Ref Range   Sodium 135 135 - 145 mmol/L   Potassium 4.1 3.5 - 5.1 mmol/L   Chloride 105 101 - 111 mmol/L   CO2 26 22 - 32 mmol/L   Glucose, Bld 113 (H) 65 - 99 mg/dL   BUN 24 (H) 6 - 20 mg/dL   Creatinine, Ser 1.46 (H) 0.61 - 1.24 mg/dL   Calcium 8.9 8.9 - 10.3 mg/dL   GFR calc non Af Amer 40 (L) >60 mL/min   GFR calc Af Amer 46 (L) >60 mL/min    Comment: (NOTE) The eGFR has been calculated using the CKD EPI equation. This calculation has not been validated in all clinical situations. eGFR's persistently <60 mL/min signify possible Chronic Kidney Disease.    Anion gap 4 (L) 5 - 15    Comment: Performed at [facility], [address] [place],  [address]  Surgical pathology     Status: None   Collection Time: 04/04/18  4:17 PM  Result Value Ref Range   SURGICAL PATHOLOGY      Surgical Pathology CASE: [id number] PATIENT: Cecilio Asper Surgical Pathology Report     SPECIMEN SUBMITTED: A. Stomach, random; cbxs B. Cecum polyp; hot snare  CLINICAL HISTORY: None provided  PRE-OPERATIVE DIAGNOSIS: Severe symptomatic anemia, h/o PUD  POST-OPERATIVE DIAGNOSIS: Duodenum bulb ulcer, gastritis, hiatal  hernia, large internal hemorrhoids, cecum polyp     DIAGNOSIS: A. STOMACH, RANDOM; COLD BIOPSY: - MODERATE CHRONIC GASTRITIS. - IHC FOR H. PYLORI STAIN IS NEGATIVE, SEE COMMENT. - NEGATIVE FOR DYSPLASIA AND MALIGNANCY.  B.  CECUM POLYP; HOT SNARE: - TUBULAR ADENOMA. - NEGATIVE FOR HIGH-GRADE DYSPLASIA AND MALIGNANCY.  Comment: The patient's history of Helicobacter gastritis is noted.  Although no organisms were identified on the Helicobacter immunohistochemical stain stool antigen studies may be helpful for the clinical exclusion of Helicobacter infection if the patient is on a proton pump inhibitor.  IHC slides were prepared by  Launa Grill, [place]. All controls stained appropriately.  This test  was developed and its performance characteristics determined by [facility]. It has not been cleared or approved by the [place] Food and Drug Administration. The FDA does not require this test to go through premarket FDA review. This test is used for clinical purposes. It should not be regarded as investigational or for research. This laboratory is certified under the Clinical Laboratory Improvement Amendments (CLIA) as qualified to perform high complexity clinical laboratory testing.    GROSS DESCRIPTION: A. Labeled: Random gastric cbx Received: In formalin Tissue fragment(s): 4 Size: 0.2-0.5 cm Description: Tan fragments Entirely submitted in one cassette.  B. Labeled: Hot snare cecal polyp Received: In formalin Tissue fragment(s): 2 Size: 0.3 and 0.5 cm Description: Tan fragments with a small amount of fecal material Entirely submitted in one cassette.    Final Diagnosis performed by Emmit Alexanders, MD.   Electronically signed 04/07/2018 8:45:43AM The electronic signature indicates that the named Attending Pathologist has evaluated the specimen  Technical component performed at [facility], [address], [place], [place] [address] Lab: [phone] Dir: Rush Farmer, MD, MMM   Professional component performed at [facility], [facility], [address], [place], [place] [address] Lab: [phone] Dir: Dellia Nims. Rubinas, MD   CBC     Status: Abnormal   Collection Time: 04/05/18  5:04 AM  Result Value Ref Range   WBC 11.2 (H) 3.8 - 10.6 K/uL   RBC 2.49 (L) 4.40 - 5.90 MIL/uL   Hemoglobin 6.7 (L) 13.0 - 18.0 g/dL   HCT 20.3 (L) 40.0 - 52.0 %   MCV 81.5 80.0 - 100.0 fL   MCH 26.7 26.0 - 34.0 pg   MCHC 32.8 32.0 - 36.0 g/dL   RDW 19.5 (H) 11.5 - 14.5 %   Platelets 179 150 - 440 K/uL    Comment: Performed at [facility], [address] [place], [place] [address]  Type and screen     Status: None   Collection Time: 04/05/18  8:41 AM  Result Value Ref Range   ABO/RH(D) O POS    Antibody Screen NEG    Sample Expiration 04/08/2018    Unit Number [id number]    Blood Component Type RBC, LR IRR    Unit division 00    Status of Unit ISSUED,FINAL    Transfusion Status OK TO TRANSFUSE    Crossmatch Result      Compatible Performed at [facility], [address] [place], [place] [address]   Prepare RBC     Status: None   Collection Time: 04/05/18  8:41 AM  Result Value Ref Range   Order Confirmation      ORDER PROCESSED BY BLOOD BANK Performed at [facility], [address] [place], [place] [address]   BPAM RBC     Status: None   Collection Time: 04/05/18  8:41 AM  Result Value Ref Range   ISSUE DATE / TIME [id number]    Blood Product Unit Number [id number]    PRODUCT CODE [id number]    Unit Type and Rh 9500    Blood Product Expiration Date [id number]   Haptoglobin     Status: None   Collection Time: 04/05/18  3:57 PM  Result Value Ref Range   Haptoglobin 167 34 - 200 mg/dL    Comment: (NOTE) Performed At: [facility] [place], [place] [id number] Rush Farmer MD [id number] Performed at [facility], [address], [place], [place] [address]   Lactate  dehydrogenase  Status: Abnormal   Collection Time: 04/05/18  3:57 PM  Result Value Ref Range   LDH 228 (H) 98 - 192 U/L    Comment: Performed at [facility], [address] [place], [place] [address]  Multiple Myeloma Panel (SPEP&IFE w/QIG)     Status: Abnormal   Collection Time: 04/05/18  3:57 PM  Result Value Ref Range   IgG (Immunoglobin G), Serum 1,132 700 - 1,600 mg/dL   IgA 131 61 - 437 mg/dL   IgM (Immunoglobulin M), Srm 44 15 - 143 mg/dL   Total Protein ELP 5.9 (L) 6.0 - 8.5 g/dL   Albumin SerPl Elph-Mcnc 2.9 2.9 - 4.4 g/dL   Alpha 1 0.4 0.0 - 0.4 g/dL   Alpha2 Glob SerPl Elph-Mcnc 0.7 0.4 - 1.0 g/dL   B-Globulin SerPl Elph-Mcnc 0.8 0.7 - 1.3 g/dL   Gamma Glob SerPl Elph-Mcnc 1.1 0.4 - 1.8 g/dL   M Protein SerPl Elph-Mcnc 0.5 (H) Not Observed g/dL   Globulin, Total 3.0 2.2 - 3.9 g/dL   Albumin/Glob SerPl 1.0 0.7 - 1.7   IFE 1 Comment     Comment: (NOTE) Immunofixation shows IgG monoclonal protein with kappa light chain specificity. Please note that samples from patients receiving DARZALEX(R) (daratumumab) treatment can appear as an "IgG kappa" and mask a complete response. If this patient is receiving DARA, this IFE assay interference can be removed by ordering test number [id number]-"Immunofixation, Daratumumab-Specific, Serum" and submitting a new sample for testing or by calling the lab to add this test to the current sample.    Please Note Comment     Comment: (NOTE) Protein electrophoresis scan will follow via computer, mail, or courier delivery. Performed At: [facility] [place], [place] [id number] Rush Farmer MD [id number] Performed at [facility], [address] [place], [place] [address]   Kappa/lambda light chains     Status: Abnormal   Collection Time: 04/05/18  3:57 PM  Result Value Ref Range   Kappa free light chain 38.1 (H) 3.3 - 19.4 mg/L   Lamda free light chains 49.5 (H) 5.7 - 26.3 mg/L    Kappa, lamda light chain ratio 0.77 0.26 - 1.65    Comment: (NOTE) Performed At: [facility] [place], [place] [id number] Rush Farmer MD [id number] Performed at [facility], [address] [place], [place] [address]   Erythropoietin     Status: Abnormal   Collection Time: 04/05/18  3:57 PM  Result Value Ref Range   Erythropoietin 22.3 (H) 2.6 - 18.5 mIU/mL    Comment: (NOTE) [employer] UniCel DxI 800 Immunoassay System Values obtained with different assay methods or kits cannot be used interchangeably. Results cannot be interpreted as absolute evidence of the presence or absence of malignant disease. Performed At: [facility] [facility] [place], [place] [id number] Rush Farmer MD [id number] Performed at [facility], [address] [place], [place] [address]   Hepatic function panel     Status: Abnormal   Collection Time: 04/05/18  3:57 PM  Result Value Ref Range   Total Protein 5.8 (L) 6.5 - 8.1 g/dL   Albumin 2.9 (L) 3.5 - 5.0 g/dL   AST 29 15 - 41 U/L   ALT 19 17 - 63 U/L   Alkaline Phosphatase 50 38 - 126 U/L   Total Bilirubin 1.1 0.3 - 1.2 mg/dL   Bilirubin, Direct 0.2 0.1 - 0.5 mg/dL   Indirect Bilirubin 0.9 0.3 - 0.9  mg/dL    Comment: Performed at [facility], [address] [place], [place] [address]  DAT, polyspecific, AHG [facility] only)     Status: None   Collection Time: 04/05/18  3:57 PM  Result Value Ref Range   Polyspecific AHG test      NEG Performed at [facility], [address] [place], [place] [address]   Technologist smear review     Status: None   Collection Time: 04/05/18  3:57 PM  Result Value Ref Range   Tech Review MIXED RBC POPULATION     Comment: BURR CELLS Acanthocytes present POLYCHROMASIA PRESENT Performed at [facility], [address] [place], [place] [address]   Hemoglobin and hematocrit, blood     Status: Abnormal    Collection Time: 04/05/18  3:57 PM  Result Value Ref Range   Hemoglobin 8.8 (L) 13.0 - 18.0 g/dL   HCT 26.3 (L) 40.0 - 52.0 %    Comment: Performed at [facility], [address] [place], [place] [address]  Protein Electro, Random Urine     Status: None   Collection Time: 04/05/18  5:19 PM  Result Value Ref Range   Total Protein, Urine 28.3 Not Estab. mg/dL   Albumin ELP, Urine 20.9 %   Alpha-1-Globulin, U 4.2 %   Alpha-2-Globulin, U 14.9 %   Beta Globulin, U 39.3 %   Gamma Globulin, U 20.7 %   M Component, Ur Not Observed Not Observed %   Please Note: Comment     Comment: (NOTE) Protein electrophoresis scan will follow via computer, mail, or courier delivery. Performed At: [facility] [place], [place] [id number] Rush Farmer MD [id number] Performed at [facility], [address] [place], [place] [address]   Sample to Blood Bank     Status: None   Collection Time: 04/09/18 11:06 AM  Result Value Ref Range   Blood Bank Specimen SAMPLE AVAILABLE FOR TESTING    Sample Expiration      04/12/2018 Performed at [facility], [address] [place], [place] [address]   CBC with Differential/Platelet     Status: Abnormal   Collection Time: 04/09/18 11:06 AM  Result Value Ref Range   WBC 10.5 3.8 - 10.6 K/uL   RBC 2.89 (L) 4.40 - 5.90 MIL/uL   Hemoglobin 8.2 (L) 13.0 - 18.0 g/dL   HCT 24.2 (L) 40.0 - 52.0 %   MCV 83.8 80.0 - 100.0 fL   MCH 28.2 26.0 - 34.0 pg   MCHC 33.7 32.0 - 36.0 g/dL   RDW 19.7 (H) 11.5 - 14.5 %   Platelets 216 150 - 440 K/uL   Neutrophils Relative % 78 %   Neutro Abs 8.2 (H) 1.4 - 6.5 K/uL   Lymphocytes Relative 9 %   Lymphs Abs 1.0 1.0 - 3.6 K/uL   Monocytes Relative 9 %   Monocytes Absolute 0.9 0.2 - 1.0 K/uL   Eosinophils Relative 3 %   Eosinophils Absolute 0.3 0 - 0.7 K/uL   Basophils Relative 1 %   Basophils Absolute 0.1 0 - 0.1 K/uL    Comment: Performed at [facility], [address] [place],  [address]  BUN     Status: Abnormal   Collection Time: 04/12/18  7:15 AM  Result Value Ref Range   BUN 31 (H) 6 - 20 mg/dL    Comment: Performed at [facility], [address], [place],  [address]  Creatinine, serum     Status: Abnormal   Collection Time: 04/12/18  7:15 AM  Result Value Ref Range   Creatinine, Ser 1.64 (H) 0.61 - 1.24 mg/dL   GFR calc non Af Amer 35 (L) >60 mL/min   GFR calc Af Amer 40 (L) >60 mL/min    Comment: (NOTE) The eGFR has been calculated using the CKD EPI equation. This calculation has not been validated in all clinical situations. eGFR's persistently <60 mL/min signify possible Chronic Kidney Disease. Performed at [facility], [address] [place], [place] [address]   CBC with Differential/Platelet     Status: Abnormal   Collection Time: 05/07/18 10:27 AM  Result Value Ref Range   WBC 8.1 3.8 - 10.6 K/uL   RBC 3.39 (L) 4.40 - 5.90 MIL/uL   Hemoglobin 9.7 (L) 13.0 - 18.0 g/dL   HCT 28.8 (L) 40.0 - 52.0 %   MCV 84.8 80.0 - 100.0 fL   MCH 28.5 26.0 - 34.0 pg   MCHC 33.6 32.0 - 36.0 g/dL   RDW 21.0 (H) 11.5 - 14.5 %   Platelets 222 150 - 440 K/uL   Neutrophils Relative % 78 %   Neutro Abs 6.4 1.4 - 6.5 K/uL   Lymphocytes Relative 12 %   Lymphs Abs 1.0 1.0 - 3.6 K/uL   Monocytes Relative 8 %   Monocytes Absolute 0.6 0.2 - 1.0 K/uL   Eosinophils Relative 1 %   Eosinophils Absolute 0.1 0 - 0.7 K/uL   Basophils Relative 1 %   Basophils Absolute 0.1 0 - 0.1 K/uL    Comment: Performed at [facility], [address] [place], [place] [address]  Basic metabolic panel     Status: Abnormal   Collection Time: 05/07/18 10:27 AM  Result Value Ref Range   Sodium 137 135 - 145 mmol/L   Potassium 4.2 3.5 - 5.1 mmol/L   Chloride 105 101 - 111 mmol/L   CO2 25 22 - 32 mmol/L   Glucose, Bld 111 (H) 65 - 99 mg/dL   BUN 22 (H) 6 - 20 mg/dL   Creatinine, Ser 1.55 (H) 0.61 - 1.24 mg/dL   Calcium  9.5 8.9 - 10.3 mg/dL   GFR calc non Af Amer 37 (L) >60 mL/min   GFR calc Af Amer 43 (L) >60 mL/min    Comment: (NOTE) The eGFR has been calculated using the CKD EPI equation. This calculation has not been validated in all clinical situations. eGFR's persistently <60 mL/min signify possible Chronic Kidney Disease.    Anion gap 7 5 - 15    Comment: Performed at [facility], [address] [place], [place] [address]    Radiology No results found.  Assessment/Plan  HTN (hypertension) blood pressure control important in reducing the progression of atherosclerotic disease. On appropriate oral medications.   Iliac artery aneurysm (HCC) Continue observation  CKD (chronic kidney disease), stage III Hydration important with each contrast procedure.  Bilateral popliteal artery aneurysm (HCC) His left leg is much better today.  The swelling has gone down and his pain is improved.  Very pleased with where we are currently are at.  Plan to recheck in about 3 to 4 months with noninvasive studies or sooner if problems develop in the interim.    Leotis Pain, MD  05/11/2018 12:20 PM    This note was created with Dragon medical transcription system.  Any errors from dictation are purely unintentional

## 2018-05-11 NOTE — Assessment & Plan Note (Signed)
blood pressure control important in reducing the progression of atherosclerotic disease. On appropriate oral medications.  

## 2018-05-11 NOTE — Assessment & Plan Note (Signed)
Hydration important with each contrast procedure.

## 2018-05-11 NOTE — Assessment & Plan Note (Signed)
Continue observation.

## 2018-05-11 NOTE — Assessment & Plan Note (Signed)
His left leg is much better today.  The swelling has gone down and his pain is improved.  Very pleased with where we are currently are at.  Plan to recheck in about 3 to 4 months with noninvasive studies or sooner if problems develop in the interim.

## 2018-05-28 DIAGNOSIS — N183 Chronic kidney disease, stage 3 (moderate): Secondary | ICD-10-CM | POA: Diagnosis not present

## 2018-05-28 DIAGNOSIS — R600 Localized edema: Secondary | ICD-10-CM | POA: Diagnosis not present

## 2018-05-28 DIAGNOSIS — I10 Essential (primary) hypertension: Secondary | ICD-10-CM | POA: Diagnosis not present

## 2018-05-28 DIAGNOSIS — I480 Paroxysmal atrial fibrillation: Secondary | ICD-10-CM | POA: Diagnosis not present

## 2018-05-28 DIAGNOSIS — E7849 Other hyperlipidemia: Secondary | ICD-10-CM | POA: Diagnosis not present

## 2018-05-28 DIAGNOSIS — R0602 Shortness of breath: Secondary | ICD-10-CM | POA: Diagnosis not present

## 2018-05-28 DIAGNOSIS — I429 Cardiomyopathy, unspecified: Secondary | ICD-10-CM | POA: Diagnosis not present

## 2018-05-28 DIAGNOSIS — I495 Sick sinus syndrome: Secondary | ICD-10-CM | POA: Diagnosis not present

## 2018-05-28 DIAGNOSIS — D62 Acute posthemorrhagic anemia: Secondary | ICD-10-CM

## 2018-05-28 DIAGNOSIS — R011 Cardiac murmur, unspecified: Secondary | ICD-10-CM | POA: Diagnosis not present

## 2018-05-28 DIAGNOSIS — I5032 Chronic diastolic (congestive) heart failure: Secondary | ICD-10-CM | POA: Diagnosis not present

## 2018-06-03 ENCOUNTER — Other Ambulatory Visit: Payer: Self-pay

## 2018-06-03 DIAGNOSIS — N183 Chronic kidney disease, stage 3 unspecified: Secondary | ICD-10-CM

## 2018-06-03 DIAGNOSIS — D509 Iron deficiency anemia, unspecified: Secondary | ICD-10-CM

## 2018-06-04 ENCOUNTER — Inpatient Hospital Stay (HOSPITAL_BASED_OUTPATIENT_CLINIC_OR_DEPARTMENT_OTHER): Payer: Medicare HMO | Admitting: Oncology

## 2018-06-04 ENCOUNTER — Other Ambulatory Visit: Payer: Self-pay

## 2018-06-04 ENCOUNTER — Inpatient Hospital Stay: Payer: Medicare HMO

## 2018-06-04 ENCOUNTER — Inpatient Hospital Stay: Payer: Medicare HMO | Attending: Oncology

## 2018-06-04 ENCOUNTER — Encounter: Payer: Self-pay | Admitting: Oncology

## 2018-06-04 VITALS — BP 165/70 | HR 61 | Temp 97.9°F | Resp 18 | Ht 70.0 in | Wt 176.7 lb

## 2018-06-04 DIAGNOSIS — I129 Hypertensive chronic kidney disease with stage 1 through stage 4 chronic kidney disease, or unspecified chronic kidney disease: Secondary | ICD-10-CM

## 2018-06-04 DIAGNOSIS — E538 Deficiency of other specified B group vitamins: Secondary | ICD-10-CM

## 2018-06-04 DIAGNOSIS — N189 Chronic kidney disease, unspecified: Secondary | ICD-10-CM

## 2018-06-04 DIAGNOSIS — Z801 Family history of malignant neoplasm of trachea, bronchus and lung: Secondary | ICD-10-CM

## 2018-06-04 DIAGNOSIS — N183 Chronic kidney disease, stage 3 unspecified: Secondary | ICD-10-CM

## 2018-06-04 DIAGNOSIS — Z8546 Personal history of malignant neoplasm of prostate: Secondary | ICD-10-CM | POA: Diagnosis not present

## 2018-06-04 DIAGNOSIS — D631 Anemia in chronic kidney disease: Secondary | ICD-10-CM

## 2018-06-04 DIAGNOSIS — Z87891 Personal history of nicotine dependence: Secondary | ICD-10-CM

## 2018-06-04 DIAGNOSIS — D509 Iron deficiency anemia, unspecified: Secondary | ICD-10-CM

## 2018-06-04 LAB — IRON AND TIBC
Iron: 31 ug/dL — ABNORMAL LOW (ref 45–182)
SATURATION RATIOS: 15 % — AB (ref 17.9–39.5)
TIBC: 212 ug/dL — ABNORMAL LOW (ref 250–450)
UIBC: 181 ug/dL

## 2018-06-04 LAB — FERRITIN: Ferritin: 335 ng/mL (ref 24–336)

## 2018-06-04 LAB — COMPREHENSIVE METABOLIC PANEL
ALT: 18 U/L (ref 17–63)
AST: 22 U/L (ref 15–41)
Albumin: 3.4 g/dL — ABNORMAL LOW (ref 3.5–5.0)
Alkaline Phosphatase: 59 U/L (ref 38–126)
Anion gap: 6 (ref 5–15)
BILIRUBIN TOTAL: 0.5 mg/dL (ref 0.3–1.2)
BUN: 27 mg/dL — AB (ref 6–20)
CHLORIDE: 107 mmol/L (ref 101–111)
CO2: 24 mmol/L (ref 22–32)
Calcium: 9.5 mg/dL (ref 8.9–10.3)
Creatinine, Ser: 1.52 mg/dL — ABNORMAL HIGH (ref 0.61–1.24)
GFR calc Af Amer: 44 mL/min — ABNORMAL LOW (ref >60)
GFR calc non Af Amer: 38 mL/min — ABNORMAL LOW (ref >60)
Glucose, Bld: 113 mg/dL — ABNORMAL HIGH (ref 65–99)
POTASSIUM: 4 mmol/L (ref 3.5–5.1)
Sodium: 137 mmol/L (ref 135–145)
Total Protein: 6.7 g/dL (ref 6.5–8.1)

## 2018-06-04 LAB — CBC WITH DIFFERENTIAL/PLATELET
Basophils Absolute: 0.1 10*3/uL (ref 0–0.1)
Basophils Relative: 1 %
EOS PCT: 3 %
Eosinophils Absolute: 0.2 10*3/uL (ref 0–0.7)
HCT: 32 % — ABNORMAL LOW (ref 40.0–52.0)
Hemoglobin: 10.7 g/dL — ABNORMAL LOW (ref 13.0–18.0)
LYMPHS ABS: 1 10*3/uL (ref 1.0–3.6)
LYMPHS PCT: 12 %
MCH: 29.1 pg (ref 26.0–34.0)
MCHC: 33.5 g/dL (ref 32.0–36.0)
MCV: 86.8 fL (ref 80.0–100.0)
MONO ABS: 0.6 10*3/uL (ref 0.2–1.0)
Monocytes Relative: 8 %
Neutro Abs: 6.3 10*3/uL (ref 1.4–6.5)
Neutrophils Relative %: 76 %
PLATELETS: 217 10*3/uL (ref 150–440)
RBC: 3.69 MIL/uL — ABNORMAL LOW (ref 4.40–5.90)
RDW: 20.3 % — AB (ref 11.5–14.5)
WBC: 8.3 10*3/uL (ref 3.8–10.6)

## 2018-06-04 NOTE — Progress Notes (Signed)
Hematology/Oncology Consult note [facility]  [phone] [phone]  Patient Care Team: Albina Billet, MD as PCP - General (Internal Medicine)   Name of the patient: Devin Washington  [id number]  May 02, 1923   Date of visit: 06/04/18  Diagnosis- anemia likely multifactorial- iron and b12 deficiency and anemia of chronic kidney disease   Chief complaint/ Reason for visit- routine f/u of anemia  Heme/Onc history: patient is a 84 year old male with a past medical history significant for atrial fibrillation, CHF, hypertension and prostate cancer who presented to the ER after he suffered a fall. On admission he was found to have H&H of 7.1/22.2 with an MCV of 80.1 and a platelet count of 268.BMP showed an elevated creatinine of 1.9. TSH was normal at 0.52.Patient received 1 unit of PRBC in the ER. Ferritin checked per day later was 152. Iron studies revealed low serum iron of 27, low TIBC of 229 and low iron saturation of 12%. B12 levels were low normal at 264. Folate level was normal at 17.5. Reticulocyte count was low at 2% for the degree of anemia.   Coombs test was negative and haptoglobin was normal.  LDH was mildly elevated at 228.  Myeloma panel showed M protein of IgG kappa 0.5 g.  Both kappa and lambda free light chains were elevated with a normal free light chain ratio 0.77.  E Po level was 22.3  Patient underwent EGD and colonoscopy on 04/04/2018. EGD showed nonobstructing nonbleeding superficial duodenal ulcers with a clean ulcer base. There are nonbleeding erosions in the gastric antrum andno stigmata of recent bleeding.Duodenum were suspicious for H. pylori.Colonoscopy showed hemorrhoids and 7 mm cecalpolyp   Interval history- he is doing well. He is still living alone and is independent of his ADL's. Denies any complaints today  ECOG PS- 1 Pain scale- 0   Review of systems- Review of Systems  Constitutional:  Negative for chills, fever, malaise/fatigue and weight loss.  HENT: Negative for congestion, ear discharge and nosebleeds.   Eyes: Negative for blurred vision.  Respiratory: Negative for cough, hemoptysis, sputum production, shortness of breath and wheezing.   Cardiovascular: Negative for chest pain, palpitations, orthopnea and claudication.  Gastrointestinal: Negative for abdominal pain, blood in stool, constipation, diarrhea, heartburn, melena, nausea and vomiting.  Genitourinary: Negative for dysuria, flank pain, frequency, hematuria and urgency.  Musculoskeletal: Negative for back pain, joint pain and myalgias.  Skin: Negative for rash.  Neurological: Negative for dizziness, tingling, focal weakness, seizures, weakness and headaches.  Endo/Heme/Allergies: Does not bruise/bleed easily.  Psychiatric/Behavioral: Negative for depression and suicidal ideas. The patient does not have insomnia.       No Known Allergies   Past Medical History:  Diagnosis Date  . A-fib ([place])   . Cardiomyopathy ([employer])   . CHF (congestive heart failure) ([place])   . DJD (degenerative joint disease)   . Duodenal ulcer 08/09/2015  . Dysrhythmia   . Erosive esophagitis   . HH (hiatus hernia)   . HLD (hyperlipidemia)   . Hypertension   . Pacemaker   . PAD (peripheral artery disease) ([place])   . Peripheral vascular disease ([place])   . Presence of permanent cardiac pacemaker   . Prostate cancer [facility])    Prostatectomy.  . 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 Shortness of breath   . SSS (sick sinus syndrome) [facility])      Past Surgical History:  Procedure Laterality Date  . CATARACT EXTRACTION, BILATERAL    . COLONOSCOPY  N/A 04/04/2018   Procedure: COLONOSCOPY;  Surgeon: Lin Landsman, MD;  Location: [facility] ENDOSCOPY;  Service: Gastroenterology;  Laterality: N/A;  . ESOPHAGOGASTRODUODENOSCOPY N/A 08/09/2015   Procedure: ESOPHAGOGASTRODUODENOSCOPY (EGD) looking in the stomach with a lighted tube to evaluate and treat;   Surgeon: Lollie Sails, MD;  Location: [facility] ENDOSCOPY;  Service: Endoscopy;  Laterality: N/A;  Procedure to be done tomorrow afternoon, 08/08/2015. Awaiting cardiology consult  . ESOPHAGOGASTRODUODENOSCOPY N/A 04/04/2018   Procedure: ESOPHAGOGASTRODUODENOSCOPY (EGD);  Surgeon: Lin Landsman, MD;  Location: [facility] ENDOSCOPY;  Service: Gastroenterology;  Laterality: N/A;  . EYE SURGERY    . FEMORAL-POPLITEAL BYPASS GRAFT Left 12/16/2017   Procedure: BYPASS GRAFT FEMORAL-POPLITEAL ARTERY ( OPEN POPLITEAL BYPASS );  Surgeon: Algernon Huxley, MD;  Location: [facility] ORS;  Service: Vascular;  Laterality: Left;  . FLEXIBLE SIGMOIDOSCOPY N/A 08/09/2015   Procedure: FLEXIBLE SIGMOIDOSCOPY looking up the rectum into the distal colon with a lighted tube to examine and treat;  Surgeon: Lollie Sails, MD;  Location: [facility] ENDOSCOPY;  Service: Endoscopy;  Laterality: N/A;  Procedure to be done tomorrow afternoon, 08/08/2015. Awaiting cardiology consult.  . INSERT / REPLACE / REMOVE PACEMAKER    . JOINT REPLACEMENT Left    left knee replacement  . LOWER EXTREMITY ANGIOGRAPHY Left 11/16/2017   Procedure: LOWER EXTREMITY ANGIOGRAPHY;  Surgeon: Algernon Huxley, MD;  Location: [place] CV LAB;  Service: Cardiovascular;  Laterality: Left;  . LOWER EXTREMITY ANGIOGRAPHY Right 11/30/2017   Procedure: LOWER EXTREMITY ANGIOGRAPHY;  Surgeon: Algernon Huxley, MD;  Location: [place] CV LAB;  Service: Cardiovascular;  Laterality: Right;  . LOWER EXTREMITY ANGIOGRAPHY Left 03/04/2018   Procedure: LOWER EXTREMITY ANGIOGRAPHY;  Surgeon: Algernon Huxley, MD;  Location: [place] CV LAB;  Service: Cardiovascular;  Laterality: Left;  . LOWER EXTREMITY ANGIOGRAPHY Left 04/12/2018   Procedure: LOWER EXTREMITY ANGIOGRAPHY;  Surgeon: Algernon Huxley, MD;  Location: [place] CV LAB;  Service: Cardiovascular;  Laterality: Left;  . PACEMAKER INSERTION    . PROSTATE SURGERY    . PROSTATE SURGERY      Social History    Socioeconomic History  . Marital status: Widowed    Spouse name: Not on file  . Number of children: Not on file  . Years of education: Not on file  . Highest education level: Not on file  Occupational History  . Not on file  Social Needs  . Financial resource strain: Not on file  . Food insecurity:    Worry: Not on file    Inability: Not on file  . Transportation needs:    Medical: Not on file    Non-medical: Not on file  Tobacco Use  . Smoking status: Former Smoker    Last attempt to quit: 03/02/1958    Years since quitting: 60.2  . Smokeless tobacco: Former Systems developer    Types: Chew, Snuff  Substance and Sexual Activity  . Alcohol use: No    Alcohol/week: 0.0 oz  . Drug use: No  . Sexual activity: Not Currently  Lifestyle  . Physical activity:    Days per week: Not on file    Minutes per session: Not on file  . Stress: Not on file  Relationships  . Social connections:    Talks on phone: Not on file    Gets together: Not on file    Attends religious service: Not on file    Active member of club or organization: Not on file    Attends meetings  of clubs or organizations: Not on file    Relationship status: Not on file  . Intimate partner violence:    Fear of current or ex partner: Not on file    Emotionally abused: Not on file    Physically abused: Not on file    Forced sexual activity: Not on file  Other Topics Concern  . Not on file  Social History Narrative  . Not on file    Family History  Problem Relation Age of Onset  . Lung cancer Father      Current Outpatient Medications:  .  ANORO ELLIPTA 62.5-25 MCG/INH AEPB, Inhale 1 puff into the lungs 2 (two) times daily., Disp: , Rfl:  .  aspirin EC 81 MG EC tablet, Take 1 tablet (81 mg total) by mouth daily., Disp: 30 tablet, Rfl: 3 .  benazepril (LOTENSIN) 20 MG tablet, Take 1 tablet (20 mg total) by mouth daily., Disp: 30 tablet, Rfl: 0 .  docusate sodium (COLACE) 100 MG capsule, Take 1 capsule (100 mg total)  by mouth daily., Disp: 10 capsule, Rfl: 0 .  feeding supplement, ENSURE ENLIVE, (ENSURE ENLIVE) LIQD, Take 237 mLs by mouth 2 (two) times daily between meals., Disp: 60 Bottle, Rfl: 1 .  ferrous sulfate 325 (65 FE) MG tablet, Take 1 tablet (325 mg total) by mouth 2 (two) times daily with a meal., Disp: 60 tablet, Rfl: 3 .  furosemide (LASIX) 20 MG tablet, Take 1 tablet by mouth daily., Disp: , Rfl:  .  metoprolol tartrate (LOPRESSOR) 25 MG tablet, Take 1 tablet (25 mg total) by mouth 2 (two) times daily., Disp: 60 tablet, Rfl: 0 .  pantoprazole (PROTONIX) 40 MG tablet, Take 1 tablet (40 mg total) by mouth daily., Disp: 30 tablet, Rfl: 0 .  simvastatin (ZOCOR) 20 MG tablet, Take 40 mg by mouth daily. , Disp: , Rfl:  .  traMADol (ULTRAM) 50 MG tablet, Take 1 tablet (50 mg total) by mouth every 6 (six) hours as needed for moderate pain or severe pain., Disp: 20 tablet, Rfl: 0 .  traZODone (DESYREL) 50 MG tablet, Take 1 tablet by mouth at bedtime as needed for sleep. , Disp: , Rfl:  .  vitamin B-12 1000 MCG tablet, Take 1 tablet (1,000 mcg total) by mouth daily., Disp: 60 tablet, Rfl: 1 .  acetaminophen (TYLENOL) 325 MG tablet, Take 2 tablets (650 mg total) by mouth every 6 (six) hours as needed for mild pain (or Fever >/= 101). (Patient not taking: Reported on 04/09/2018), Disp: , Rfl:  .  albuterol (PROVENTIL HFA;VENTOLIN HFA) 108 (90 Base) MCG/ACT inhaler, Inhale 2 puffs into the lungs every 6 (six) hours as needed for wheezing or shortness of breath. (Patient not taking: Reported on 04/09/2018), Disp: 1 Inhaler, Rfl: 2 No current facility-administered medications for this visit.   Facility-Administered Medications Ordered in Other Visits:  .  Darbepoetin Alfa (ARANESP) injection 100 mcg, 100 mcg, Subcutaneous, Q28 days, Sindy Guadeloupe, MD, 100 mcg at 04/16/18 1505  Physical exam:  Vitals:   06/04/18 1016  BP: (!) 165/70  Pulse: 61  Resp: 18  Temp: 97.9 F (36.6 C)  TempSrc: Tympanic  SpO2:  99%  Weight: 176 lb 11.2 oz (80.2 kg)  Height: 5\' 10"  (1.778 m)   Physical Exam  Constitutional: He is oriented to person, place, and time. He appears well-developed and well-nourished.  HENT:  Head: Normocephalic and atraumatic.  Eyes: Pupils are equal, round, and reactive to light. EOM are normal.  Neck: Normal range of motion.  Cardiovascular: Normal rate and regular rhythm.  Systolic murmur +  Pulmonary/Chest: Effort normal and breath sounds normal.  Abdominal: Soft. Bowel sounds are normal.  Neurological: He is alert and oriented to person, place, and time.  Skin: Skin is warm and dry.     CMP Latest Ref Rng & Units 06/04/2018  Glucose 65 - 99 mg/dL 113(H)  BUN 6 - 20 mg/dL 27(H)  Creatinine 0.61 - 1.24 mg/dL 1.52(H)  Sodium 135 - 145 mmol/L 137  Potassium 3.5 - 5.1 mmol/L 4.0  Chloride 101 - 111 mmol/L 107  CO2 22 - 32 mmol/L 24  Calcium 8.9 - 10.3 mg/dL 9.5  Total Protein 6.5 - 8.1 g/dL 6.7  Total Bilirubin 0.3 - 1.2 mg/dL 0.5  Alkaline Phos 38 - 126 U/L 59  AST 15 - 41 U/L 22  ALT 17 - 63 U/L 18   CBC Latest Ref Rng & Units 06/04/2018  WBC 3.8 - 10.6 K/uL 8.3  Hemoglobin 13.0 - 18.0 g/dL 10.7(L)  Hematocrit 40.0 - 52.0 % 32.0(L)  Platelets 150 - 440 K/uL 217      Assessment and plan- Patient is a 84 y.o. male with normocytic anemia likely multifactorial secondary to iron and B12 deficiency as well as anemia of chronic kidney disease here for routine f/u of anemia  Iron studies from today show that his ferritin is improved. Iron studies reflective of anemia of chronic disease. He is taking PO b12. He received aranesp monthly but does not need it today as his hb is >10.  Repeat cbc in 6 weeks. Will restart aranesp if hb <10  I will see him back in 12 weeks with cbc, ferritin and iron studies and b12. Possible aranesp    Visit Diagnosis 1. Anemia in chronic kidney disease, unspecified CKD stage   2. Iron deficiency anemia, unspecified iron deficiency anemia  type   3. B12 deficiency      Dr. Randa Evens, MD, MPH [facility] at [facility] [phone] 06/04/2018 12:53 PM

## 2018-06-04 NOTE — Progress Notes (Signed)
No new changes noted today 

## 2018-06-09 DIAGNOSIS — E785 Hyperlipidemia, unspecified: Secondary | ICD-10-CM | POA: Diagnosis not present

## 2018-06-09 DIAGNOSIS — I10 Essential (primary) hypertension: Secondary | ICD-10-CM | POA: Diagnosis not present

## 2018-06-11 DIAGNOSIS — E785 Hyperlipidemia, unspecified: Secondary | ICD-10-CM | POA: Diagnosis not present

## 2018-06-11 DIAGNOSIS — I10 Essential (primary) hypertension: Secondary | ICD-10-CM | POA: Diagnosis not present

## 2018-06-21 DIAGNOSIS — N2581 Secondary hyperparathyroidism of renal origin: Secondary | ICD-10-CM | POA: Diagnosis not present

## 2018-06-21 DIAGNOSIS — I129 Hypertensive chronic kidney disease with stage 1 through stage 4 chronic kidney disease, or unspecified chronic kidney disease: Secondary | ICD-10-CM | POA: Diagnosis not present

## 2018-06-21 DIAGNOSIS — N183 Chronic kidney disease, stage 3 (moderate): Secondary | ICD-10-CM | POA: Diagnosis not present

## 2018-06-21 DIAGNOSIS — D631 Anemia in chronic kidney disease: Secondary | ICD-10-CM | POA: Diagnosis not present

## 2018-06-25 DIAGNOSIS — I10 Essential (primary) hypertension: Secondary | ICD-10-CM | POA: Diagnosis not present

## 2018-07-02 DIAGNOSIS — I10 Essential (primary) hypertension: Secondary | ICD-10-CM | POA: Diagnosis not present

## 2018-07-16 ENCOUNTER — Inpatient Hospital Stay: Payer: Medicare HMO | Attending: Oncology

## 2018-07-16 ENCOUNTER — Inpatient Hospital Stay: Payer: Medicare HMO

## 2018-07-16 DIAGNOSIS — E538 Deficiency of other specified B group vitamins: Secondary | ICD-10-CM | POA: Diagnosis not present

## 2018-07-16 DIAGNOSIS — I129 Hypertensive chronic kidney disease with stage 1 through stage 4 chronic kidney disease, or unspecified chronic kidney disease: Secondary | ICD-10-CM | POA: Diagnosis not present

## 2018-07-16 DIAGNOSIS — D509 Iron deficiency anemia, unspecified: Secondary | ICD-10-CM | POA: Diagnosis not present

## 2018-07-16 DIAGNOSIS — N189 Chronic kidney disease, unspecified: Principal | ICD-10-CM

## 2018-07-16 DIAGNOSIS — Z801 Family history of malignant neoplasm of trachea, bronchus and lung: Secondary | ICD-10-CM | POA: Diagnosis not present

## 2018-07-16 DIAGNOSIS — Z87891 Personal history of nicotine dependence: Secondary | ICD-10-CM | POA: Insufficient documentation

## 2018-07-16 DIAGNOSIS — Z8546 Personal history of malignant neoplasm of prostate: Secondary | ICD-10-CM | POA: Diagnosis not present

## 2018-07-16 DIAGNOSIS — D631 Anemia in chronic kidney disease: Secondary | ICD-10-CM | POA: Diagnosis not present

## 2018-07-16 LAB — CBC
HCT: 32.4 % — ABNORMAL LOW (ref 40.0–52.0)
Hemoglobin: 10.7 g/dL — ABNORMAL LOW (ref 13.0–18.0)
MCH: 28.6 pg (ref 26.0–34.0)
MCHC: 33 g/dL (ref 32.0–36.0)
MCV: 86.7 fL (ref 80.0–100.0)
PLATELETS: 211 10*3/uL (ref 150–440)
RBC: 3.73 MIL/uL — AB (ref 4.40–5.90)
RDW: 18.8 % — ABNORMAL HIGH (ref 11.5–14.5)
WBC: 9.8 10*3/uL (ref 3.8–10.6)

## 2018-07-23 DIAGNOSIS — I10 Essential (primary) hypertension: Secondary | ICD-10-CM | POA: Diagnosis not present

## 2018-08-27 ENCOUNTER — Inpatient Hospital Stay: Payer: Medicare HMO

## 2018-08-27 ENCOUNTER — Encounter: Payer: Self-pay | Admitting: Oncology

## 2018-08-27 ENCOUNTER — Inpatient Hospital Stay: Payer: Medicare HMO | Attending: Oncology | Admitting: Oncology

## 2018-08-27 VITALS — BP 149/62 | HR 69 | Temp 98.9°F | Resp 18 | Ht 70.0 in | Wt 179.0 lb

## 2018-08-27 DIAGNOSIS — I10 Essential (primary) hypertension: Secondary | ICD-10-CM | POA: Insufficient documentation

## 2018-08-27 DIAGNOSIS — C184 Malignant neoplasm of transverse colon: Secondary | ICD-10-CM | POA: Insufficient documentation

## 2018-08-27 DIAGNOSIS — E611 Iron deficiency: Secondary | ICD-10-CM | POA: Diagnosis not present

## 2018-08-27 DIAGNOSIS — Z862 Personal history of diseases of the blood and blood-forming organs and certain disorders involving the immune mechanism: Secondary | ICD-10-CM

## 2018-08-27 DIAGNOSIS — Z87442 Personal history of urinary calculi: Secondary | ICD-10-CM | POA: Insufficient documentation

## 2018-08-27 DIAGNOSIS — D631 Anemia in chronic kidney disease: Secondary | ICD-10-CM

## 2018-08-27 DIAGNOSIS — N189 Chronic kidney disease, unspecified: Secondary | ICD-10-CM

## 2018-08-27 DIAGNOSIS — C20 Malignant neoplasm of rectum: Secondary | ICD-10-CM | POA: Insufficient documentation

## 2018-08-27 DIAGNOSIS — E119 Type 2 diabetes mellitus without complications: Secondary | ICD-10-CM | POA: Diagnosis not present

## 2018-08-27 DIAGNOSIS — Z86711 Personal history of pulmonary embolism: Secondary | ICD-10-CM | POA: Insufficient documentation

## 2018-08-27 DIAGNOSIS — D509 Iron deficiency anemia, unspecified: Secondary | ICD-10-CM

## 2018-08-27 LAB — CBC
HCT: 33.5 % — ABNORMAL LOW (ref 40.0–52.0)
Hemoglobin: 11.2 g/dL — ABNORMAL LOW (ref 13.0–18.0)
MCH: 29.3 pg (ref 26.0–34.0)
MCHC: 33.3 g/dL (ref 32.0–36.0)
MCV: 87.9 fL (ref 80.0–100.0)
Platelets: 203 10*3/uL (ref 150–440)
RBC: 3.81 MIL/uL — ABNORMAL LOW (ref 4.40–5.90)
RDW: 17.7 % — AB (ref 11.5–14.5)
WBC: 9.4 10*3/uL (ref 3.8–10.6)

## 2018-08-27 LAB — IRON AND TIBC
Iron: 38 ug/dL — ABNORMAL LOW (ref 45–182)
Saturation Ratios: 17 % — ABNORMAL LOW (ref 17.9–39.5)
TIBC: 222 ug/dL — AB (ref 250–450)
UIBC: 185 ug/dL

## 2018-08-27 LAB — FERRITIN: Ferritin: 298 ng/mL (ref 24–336)

## 2018-08-27 LAB — VITAMIN B12: Vitamin B-12: 1289 pg/mL — ABNORMAL HIGH (ref 180–914)

## 2018-08-27 NOTE — Progress Notes (Signed)
No new changes noted today 

## 2018-08-27 NOTE — Progress Notes (Signed)
Hematology/Oncology Consult note [facility]  [phone] [phone]  Patient Care Team: Albina Billet, MD as PCP - General (Internal Medicine)   Name of the patient: Devin Washington  [id number]  June 21, 1923   Date of visit: 08/27/18  Diagnosis- anemia likely multifactorial- iron and b12 deficiency and anemia of chronic kidney disease   Chief complaint/ Reason for visit- routine f/u of anemia  Heme/Onc history: patient is a 84 year old male with a past medical history significant for atrial fibrillation, CHF, hypertension and prostate cancer who presented to the ER after he suffered a fall. On admission he was found to have H&H of 7.1/22.2 with an MCV of 80.1 and a platelet count of 268.BMP showed an elevated creatinine of 1.9. TSH was normal at 0.52.Patient received 1 unit of PRBC in the ER. Ferritin checked per day later was 152. Iron studies revealed low serum iron of 27, low TIBC of 229 and low iron saturation of 12%. B12 levels were low normal at 264. Folate level was normal at 17.5. Reticulocyte count was low at 2% for the degree of anemia.Coombs test was negative and haptoglobin was normal. LDH was mildly elevated at 228. Myeloma panel showed M protein of IgG kappa 0.5 g. Both kappa and lambda free light chains were elevated with a normal free light chain ratio 0.77. E Po level was 22.3  Patient underwent EGD and colonoscopy on 04/04/2018. EGD showed nonobstructing nonbleeding superficial duodenal ulcers with a clean ulcer base. There are nonbleeding erosions in the gastric antrum andno stigmata of recent bleeding.Duodenum were suspicious for H. pylori.Colonoscopy showed hemorrhoids and 7 mm cecalpolyp  Interval history- he is doing well. He lives alone and is independent of adls. No recent falls. Denies any blood in stool or urine  ECOG PS- 1 Pain scale- 0 Opioid associated constipation- no  Review of systems-  Review of Systems  Constitutional: Positive for malaise/fatigue. Negative for chills, fever and weight loss.  HENT: Negative for congestion, ear discharge and nosebleeds.   Eyes: Negative for blurred vision.  Respiratory: Negative for cough, hemoptysis, sputum production, shortness of breath and wheezing.   Cardiovascular: Negative for chest pain, palpitations, orthopnea and claudication.  Gastrointestinal: Negative for abdominal pain, blood in stool, constipation, diarrhea, heartburn, melena, nausea and vomiting.  Genitourinary: Negative for dysuria, flank pain, frequency, hematuria and urgency.  Musculoskeletal: Negative for back pain, joint pain and myalgias.  Skin: Negative for rash.  Neurological: Negative for dizziness, tingling, focal weakness, seizures, weakness and headaches.  Endo/Heme/Allergies: Does not bruise/bleed easily.  Psychiatric/Behavioral: Negative for depression and suicidal ideas. The patient does not have insomnia.        No Known Allergies   Past Medical History:  Diagnosis Date  . A-fib ([place])   . Cardiomyopathy ([place])   . CHF (congestive heart failure) ([place])   . DJD (degenerative joint disease)   . Duodenal ulcer 08/09/2015  . Dysrhythmia   . Erosive esophagitis   . HH (hiatus hernia)   . HLD (hyperlipidemia)   . Hypertension   . Pacemaker   . PAD (peripheral artery disease) ([place])   . Peripheral vascular disease ([place])   . Presence of permanent cardiac pacemaker   . Prostate cancer [facility])    Prostatectomy.  . 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 Shortness of breath   . SSS (sick sinus syndrome) [facility])      Past Surgical History:  Procedure Laterality Date  . CATARACT EXTRACTION, BILATERAL    .  COLONOSCOPY N/A 04/04/2018   Procedure: COLONOSCOPY;  Surgeon: Lin Landsman, MD;  Location: [facility] ENDOSCOPY;  Service: Gastroenterology;  Laterality: N/A;  . ESOPHAGOGASTRODUODENOSCOPY N/A 08/09/2015   Procedure: ESOPHAGOGASTRODUODENOSCOPY (EGD) looking in the  stomach with a lighted tube to evaluate and treat;  Surgeon: Lollie Sails, MD;  Location: [facility] ENDOSCOPY;  Service: Endoscopy;  Laterality: N/A;  Procedure to be done tomorrow afternoon, 08/08/2015. Awaiting cardiology consult  . ESOPHAGOGASTRODUODENOSCOPY N/A 04/04/2018   Procedure: ESOPHAGOGASTRODUODENOSCOPY (EGD);  Surgeon: Lin Landsman, MD;  Location: [facility] ENDOSCOPY;  Service: Gastroenterology;  Laterality: N/A;  . EYE SURGERY    . FEMORAL-POPLITEAL BYPASS GRAFT Left 12/16/2017   Procedure: BYPASS GRAFT FEMORAL-POPLITEAL ARTERY ( OPEN POPLITEAL BYPASS );  Surgeon: Algernon Huxley, MD;  Location: [facility] ORS;  Service: Vascular;  Laterality: Left;  . FLEXIBLE SIGMOIDOSCOPY N/A 08/09/2015   Procedure: FLEXIBLE SIGMOIDOSCOPY looking up the rectum into the distal colon with a lighted tube to examine and treat;  Surgeon: Lollie Sails, MD;  Location: [facility] ENDOSCOPY;  Service: Endoscopy;  Laterality: N/A;  Procedure to be done tomorrow afternoon, 08/08/2015. Awaiting cardiology consult.  . INSERT / REPLACE / REMOVE PACEMAKER    . JOINT REPLACEMENT Left    left knee replacement  . LOWER EXTREMITY ANGIOGRAPHY Left 11/16/2017   Procedure: LOWER EXTREMITY ANGIOGRAPHY;  Surgeon: Algernon Huxley, MD;  Location: [place] CV LAB;  Service: Cardiovascular;  Laterality: Left;  . LOWER EXTREMITY ANGIOGRAPHY Right 11/30/2017   Procedure: LOWER EXTREMITY ANGIOGRAPHY;  Surgeon: Algernon Huxley, MD;  Location: [place] CV LAB;  Service: Cardiovascular;  Laterality: Right;  . LOWER EXTREMITY ANGIOGRAPHY Left 03/04/2018   Procedure: LOWER EXTREMITY ANGIOGRAPHY;  Surgeon: Algernon Huxley, MD;  Location: [place] CV LAB;  Service: Cardiovascular;  Laterality: Left;  . LOWER EXTREMITY ANGIOGRAPHY Left 04/12/2018   Procedure: LOWER EXTREMITY ANGIOGRAPHY;  Surgeon: Algernon Huxley, MD;  Location: [place] CV LAB;  Service: Cardiovascular;  Laterality: Left;  . PACEMAKER INSERTION    . PROSTATE SURGERY    .  PROSTATE SURGERY      Social History   Socioeconomic History  . Marital status: Widowed    Spouse name: Not on file  . Number of children: Not on file  . Years of education: Not on file  . Highest education level: Not on file  Occupational History  . Not on file  Social Needs  . Financial resource strain: Not on file  . Food insecurity:    Worry: Not on file    Inability: Not on file  . Transportation needs:    Medical: Not on file    Non-medical: Not on file  Tobacco Use  . Smoking status: Former Smoker    Last attempt to quit: 03/02/1958    Years since quitting: 60.5  . Smokeless tobacco: Former Systems developer    Types: Chew, Snuff  Substance and Sexual Activity  . Alcohol use: No    Alcohol/week: 0.0 standard drinks  . Drug use: No  . Sexual activity: Not Currently  Lifestyle  . Physical activity:    Days per week: Not on file    Minutes per session: Not on file  . Stress: Not on file  Relationships  . Social connections:    Talks on phone: Not on file    Gets together: Not on file    Attends religious service: Not on file    Active member of club or organization: Not on file  Attends meetings of clubs or organizations: Not on file    Relationship status: Not on file  . Intimate partner violence:    Fear of current or ex partner: Not on file    Emotionally abused: Not on file    Physically abused: Not on file    Forced sexual activity: Not on file  Other Topics Concern  . Not on file  Social History Narrative  . Not on file    Family History  Problem Relation Age of Onset  . Lung cancer Father      Current Outpatient Medications:  .  ANORO ELLIPTA 62.5-25 MCG/INH AEPB, Inhale 1 puff into the lungs 2 (two) times daily., Disp: , Rfl:  .  aspirin EC 81 MG EC tablet, Take 1 tablet (81 mg total) by mouth daily., Disp: 30 tablet, Rfl: 3 .  benazepril (LOTENSIN) 20 MG tablet, Take 1 tablet (20 mg total) by mouth daily., Disp: 30 tablet, Rfl: 0 .  docusate sodium  (COLACE) 100 MG capsule, Take 1 capsule (100 mg total) by mouth daily., Disp: 10 capsule, Rfl: 0 .  feeding supplement, ENSURE ENLIVE, (ENSURE ENLIVE) LIQD, Take 237 mLs by mouth 2 (two) times daily between meals., Disp: 60 Bottle, Rfl: 1 .  ferrous sulfate 325 (65 FE) MG tablet, Take 1 tablet (325 mg total) by mouth 2 (two) times daily with a meal., Disp: 60 tablet, Rfl: 3 .  furosemide (LASIX) 20 MG tablet, Take 1 tablet by mouth daily., Disp: , Rfl:  .  metoprolol tartrate (LOPRESSOR) 25 MG tablet, Take 1 tablet (25 mg total) by mouth 2 (two) times daily., Disp: 60 tablet, Rfl: 0 .  pantoprazole (PROTONIX) 40 MG tablet, Take 1 tablet (40 mg total) by mouth daily., Disp: 30 tablet, Rfl: 0 .  simvastatin (ZOCOR) 20 MG tablet, Take 40 mg by mouth daily. , Disp: , Rfl:  .  traMADol (ULTRAM) 50 MG tablet, Take 1 tablet (50 mg total) by mouth every 6 (six) hours as needed for moderate pain or severe pain., Disp: 20 tablet, Rfl: 0 .  traZODone (DESYREL) 50 MG tablet, Take 1 tablet by mouth at bedtime as needed for sleep. , Disp: , Rfl:  .  vitamin B-12 1000 MCG tablet, Take 1 tablet (1,000 mcg total) by mouth daily., Disp: 60 tablet, Rfl: 1 .  acetaminophen (TYLENOL) 325 MG tablet, Take 2 tablets (650 mg total) by mouth every 6 (six) hours as needed for mild pain (or Fever >/= 101). (Patient not taking: Reported on 04/09/2018), Disp: , Rfl:  .  albuterol (PROVENTIL HFA;VENTOLIN HFA) 108 (90 Base) MCG/ACT inhaler, Inhale 2 puffs into the lungs every 6 (six) hours as needed for wheezing or shortness of breath. (Patient not taking: Reported on 04/09/2018), Disp: 1 Inhaler, Rfl: 2 No current facility-administered medications for this visit.   Facility-Administered Medications Ordered in Other Visits:  .  Darbepoetin Alfa (ARANESP) injection 100 mcg, 100 mcg, Subcutaneous, Q28 days, Sindy Guadeloupe, MD, 100 mcg at 04/16/18 1505  Physical exam:  Vitals:   08/27/18 1036  BP: (!) 149/62  Pulse: 69  Resp: 18    Temp: 98.9 F (37.2 C)  Weight: 179 lb (81.2 kg)  Height: 5\' 10"  (1.778 m)   Physical Exam  Constitutional: He is oriented to person, place, and time.  Elderly gentleman sitting ina  Wheelchair. Appears in no acute distress  HENT:  Head: Normocephalic and atraumatic.  Eyes: Pupils are equal, round, and reactive to light. EOM are  normal.  Neck: Normal range of motion.  Cardiovascular: Normal rate and regular rhythm.  Systolic murmur +  Pulmonary/Chest: Effort normal and breath sounds normal.  Abdominal: Soft. Bowel sounds are normal.  Neurological: He is alert and oriented to person, place, and time.  Skin: Skin is warm and dry.     CMP Latest Ref Rng & Units 06/04/2018  Glucose 65 - 99 mg/dL 113(H)  BUN 6 - 20 mg/dL 27(H)  Creatinine 0.61 - 1.24 mg/dL 1.52(H)  Sodium 135 - 145 mmol/L 137  Potassium 3.5 - 5.1 mmol/L 4.0  Chloride 101 - 111 mmol/L 107  CO2 22 - 32 mmol/L 24  Calcium 8.9 - 10.3 mg/dL 9.5  Total Protein 6.5 - 8.1 g/dL 6.7  Total Bilirubin 0.3 - 1.2 mg/dL 0.5  Alkaline Phos 38 - 126 U/L 59  AST 15 - 41 U/L 22  ALT 17 - 63 U/L 18   CBC Latest Ref Rng & Units 08/27/2018  WBC 3.8 - 10.6 K/uL 9.4  Hemoglobin 13.0 - 18.0 g/dL 11.2(L)  Hematocrit 40.0 - 52.0 % 33.5(L)  Platelets 150 - 440 K/uL 203     Assessment and plan- Patient is a 84 y.o. male with multifactorial anemia secondary to iron deficiency as well as anemia of chronic kidney disease.  Today his hemoglobin continues to improve and is up to 11.2 today.  Iron studies are within normal limits.  He required 2 doses of Aranesp in May 2019 but none since then.  He does not require Aranesp today.  Given stability of his counts I will repeat a CBC CMP in 3 in 6 months and see him back in 6 months.  We will also check iron studies at each visit   Visit Diagnosis 1. Iron deficiency anemia, unspecified iron deficiency anemia type   2. History of anemia due to chronic kidney disease      Dr. Randa Evens, MD,  [facility] at [facility] [phone] 08/27/2018 1:11 PM

## 2018-09-07 DIAGNOSIS — N183 Chronic kidney disease, stage 3 (moderate): Secondary | ICD-10-CM | POA: Diagnosis not present

## 2018-09-07 DIAGNOSIS — R011 Cardiac murmur, unspecified: Secondary | ICD-10-CM | POA: Diagnosis not present

## 2018-09-07 DIAGNOSIS — I429 Cardiomyopathy, unspecified: Secondary | ICD-10-CM | POA: Diagnosis not present

## 2018-09-07 DIAGNOSIS — I10 Essential (primary) hypertension: Secondary | ICD-10-CM | POA: Diagnosis not present

## 2018-09-07 DIAGNOSIS — I5032 Chronic diastolic (congestive) heart failure: Secondary | ICD-10-CM | POA: Diagnosis not present

## 2018-09-07 DIAGNOSIS — R0602 Shortness of breath: Secondary | ICD-10-CM | POA: Diagnosis not present

## 2018-09-07 DIAGNOSIS — I495 Sick sinus syndrome: Secondary | ICD-10-CM | POA: Diagnosis not present

## 2018-09-07 DIAGNOSIS — I480 Paroxysmal atrial fibrillation: Secondary | ICD-10-CM | POA: Diagnosis not present

## 2018-09-07 DIAGNOSIS — I723 Aneurysm of iliac artery: Secondary | ICD-10-CM | POA: Diagnosis not present

## 2018-09-07 DIAGNOSIS — E7849 Other hyperlipidemia: Secondary | ICD-10-CM | POA: Diagnosis not present

## 2018-09-14 ENCOUNTER — Ambulatory Visit (INDEPENDENT_AMBULATORY_CARE_PROVIDER_SITE_OTHER): Payer: Medicare HMO

## 2018-09-14 ENCOUNTER — Encounter (INDEPENDENT_AMBULATORY_CARE_PROVIDER_SITE_OTHER): Payer: Self-pay | Admitting: Vascular Surgery

## 2018-09-14 ENCOUNTER — Ambulatory Visit (INDEPENDENT_AMBULATORY_CARE_PROVIDER_SITE_OTHER): Payer: Medicare HMO | Admitting: Vascular Surgery

## 2018-09-14 VITALS — BP 182/72 | HR 65 | Resp 17 | Ht 70.0 in | Wt 165.0 lb

## 2018-09-14 DIAGNOSIS — I724 Aneurysm of artery of lower extremity: Secondary | ICD-10-CM

## 2018-09-14 DIAGNOSIS — N183 Chronic kidney disease, stage 3 unspecified: Secondary | ICD-10-CM

## 2018-09-14 DIAGNOSIS — Z9862 Peripheral vascular angioplasty status: Secondary | ICD-10-CM

## 2018-09-14 DIAGNOSIS — E785 Hyperlipidemia, unspecified: Secondary | ICD-10-CM

## 2018-09-14 DIAGNOSIS — I129 Hypertensive chronic kidney disease with stage 1 through stage 4 chronic kidney disease, or unspecified chronic kidney disease: Secondary | ICD-10-CM | POA: Diagnosis not present

## 2018-09-14 DIAGNOSIS — Z87891 Personal history of nicotine dependence: Secondary | ICD-10-CM

## 2018-09-14 DIAGNOSIS — I70203 Unspecified atherosclerosis of native arteries of extremities, bilateral legs: Secondary | ICD-10-CM | POA: Diagnosis not present

## 2018-09-14 DIAGNOSIS — I723 Aneurysm of iliac artery: Secondary | ICD-10-CM

## 2018-09-14 DIAGNOSIS — I10 Essential (primary) hypertension: Secondary | ICD-10-CM

## 2018-09-14 NOTE — Assessment & Plan Note (Signed)
lipid control important in reducing the progression of atherosclerotic disease. Continue statin therapy  

## 2018-09-14 NOTE — Assessment & Plan Note (Signed)
Check at next visit with duplex.

## 2018-09-14 NOTE — Assessment & Plan Note (Signed)
Noninvasive studies today demonstrate a patent right popliteal artery stent that extends down into the TP trunk.  His femoral to distal bypass in the left leg is patent.  His ABIs are 0.84 on the right and 0.91 on the left with normal digital pressures.  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 His leg swelling is much better and he is doing quite well from a vascular standpoint.  We will extend his follow-up to 6 months.  He will contact our office with any problems in the interim.

## 2018-09-14 NOTE — Progress Notes (Signed)
MRN : [id number]  Devin Washington is a 84 y.o. (1923/05/29) male who presents with chief complaint of  Chief Complaint  Patient presents with  . Follow-up    3-4 month ABI and Arterial Duplex  .  History of Present Illness: Patient returns today in follow up of his popliteal aneurysms.  His leg swelling on the left is markedly improved after coil embolization of his native aneurysm sac and open surgical bypass for a massive popliteal aneurysm.  He still has some mild swelling bilaterally but has multiple other causes of swelling as well.  No open wounds or infection.  The legs feel much better with only mild discomfort. Noninvasive studies today demonstrate a patent right popliteal artery stent that extends down into the TP trunk.  His femoral to distal bypass in the left leg is patent.  His ABIs are 0.84 on the right and 0.91 on the left with normal digital pressures.  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  Current Outpatient Medications  Medication Sig Dispense Refill  . acetaminophen (TYLENOL) 325 MG tablet Take 2 tablets (650 mg total) by mouth every 6 (six) hours as needed for mild pain (or Fever >/= 101).    Marland Kitchen albuterol (PROVENTIL HFA;VENTOLIN HFA) 108 (90 Base) MCG/ACT inhaler Inhale 2 puffs into the lungs every 6 (six) hours as needed for wheezing or shortness of breath. 1 Inhaler 2  . amLODipine (NORVASC) 2.5 MG tablet     . ANORO ELLIPTA 62.5-25 MCG/INH AEPB Inhale 1 puff into the lungs 2 (two) times daily.    Marland Kitchen aspirin EC 81 MG EC tablet Take 1 tablet (81 mg total) by mouth daily. 30 tablet 3  . benazepril (LOTENSIN) 20 MG tablet Take 1 tablet (20 mg total) by mouth daily. 30 tablet 0  . docusate sodium (COLACE) 100 MG capsule Take 1 capsule (100 mg total) by mouth daily. 10 capsule 0  . feeding supplement, ENSURE ENLIVE, (ENSURE ENLIVE) LIQD Take 237 mLs by mouth 2 (two) times  daily between meals. 60 Bottle 1  . ferrous sulfate 325 (65 FE) MG tablet Take 1 tablet (325 mg total) by mouth 2 (two) times daily with a meal. 60 tablet 3  . furosemide (LASIX) 20 MG tablet Take 1 tablet by mouth daily.    . metoprolol tartrate (LOPRESSOR) 25 MG tablet Take 1 tablet (25 mg total) by mouth 2 (two) times daily. 60 tablet 0  . pantoprazole (PROTONIX) 40 MG tablet Take 1 tablet (40 mg total) by mouth daily. 30 tablet 0  . simvastatin (ZOCOR) 20 MG tablet Take 40 mg by mouth daily.     . traMADol (ULTRAM) 50 MG tablet Take 1 tablet (50 mg total) by mouth every 6 (six) hours as needed for moderate pain or severe pain. 20 tablet 0  . traZODone (DESYREL) 50 MG tablet Take 1 tablet by mouth at bedtime as needed for sleep.     . vitamin B-12 1000 MCG tablet Take 1 tablet (1,000 mcg total) by mouth daily. 60 tablet 1   No current facility-administered medications for this visit.    Facility-Administered Medications Ordered in Other Visits  Medication Dose Route Frequency Provider Last Rate Last Dose  . Darbepoetin Alfa (ARANESP) injection 100 mcg  100 mcg Subcutaneous Q28 days Sindy Guadeloupe, MD   100 mcg at 04/16/18 1505    Past  Medical History:  Diagnosis Date  . A-fib ([place])   . Cardiomyopathy ([place])   . CHF (congestive heart failure) ([place])   . DJD (degenerative joint disease)   . Duodenal ulcer 08/09/2015  . Dysrhythmia   . Erosive esophagitis   . HH (hiatus hernia)   . HLD (hyperlipidemia)   . Hypertension   . Pacemaker   . PAD (peripheral artery disease) ([place])   . Peripheral vascular disease ([place])   . Presence of permanent cardiac pacemaker   . Prostate cancer [facility])    Prostatectomy.  . 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 Shortness of breath   . SSS (sick sinus syndrome) [facility])     Past Surgical History:  Procedure Laterality Date  . CATARACT EXTRACTION, BILATERAL    . COLONOSCOPY N/A 04/04/2018   Procedure: COLONOSCOPY;  Surgeon: Lin Landsman, MD;  Location:  [facility] ENDOSCOPY;  Service: Gastroenterology;  Laterality: N/A;  . ESOPHAGOGASTRODUODENOSCOPY N/A 08/09/2015   Procedure: ESOPHAGOGASTRODUODENOSCOPY (EGD) looking in the stomach with a lighted tube to evaluate and treat;  Surgeon: Lollie Sails, MD;  Location: [facility] ENDOSCOPY;  Service: Endoscopy;  Laterality: N/A;  Procedure to be done tomorrow afternoon, 08/08/2015. Awaiting cardiology consult  . ESOPHAGOGASTRODUODENOSCOPY N/A 04/04/2018   Procedure: ESOPHAGOGASTRODUODENOSCOPY (EGD);  Surgeon: Lin Landsman, MD;  Location: [facility] ENDOSCOPY;  Service: Gastroenterology;  Laterality: N/A;  . EYE SURGERY    . FEMORAL-POPLITEAL BYPASS GRAFT Left 12/16/2017   Procedure: BYPASS GRAFT FEMORAL-POPLITEAL ARTERY ( OPEN POPLITEAL BYPASS );  Surgeon: Algernon Huxley, MD;  Location: [facility] ORS;  Service: Vascular;  Laterality: Left;  . FLEXIBLE SIGMOIDOSCOPY N/A 08/09/2015   Procedure: FLEXIBLE SIGMOIDOSCOPY looking up the rectum into the distal colon with a lighted tube to examine and treat;  Surgeon: Lollie Sails, MD;  Location: [facility] ENDOSCOPY;  Service: Endoscopy;  Laterality: N/A;  Procedure to be done tomorrow afternoon, 08/08/2015. Awaiting cardiology consult.  . INSERT / REPLACE / REMOVE PACEMAKER    . JOINT REPLACEMENT Left    left knee replacement  . LOWER EXTREMITY ANGIOGRAPHY Left 11/16/2017   Procedure: LOWER EXTREMITY ANGIOGRAPHY;  Surgeon: Algernon Huxley, MD;  Location: [place] CV LAB;  Service: Cardiovascular;  Laterality: Left;  . LOWER EXTREMITY ANGIOGRAPHY Right 11/30/2017   Procedure: LOWER EXTREMITY ANGIOGRAPHY;  Surgeon: Algernon Huxley, MD;  Location: [place] CV LAB;  Service: Cardiovascular;  Laterality: Right;  . LOWER EXTREMITY ANGIOGRAPHY Left 03/04/2018   Procedure: LOWER EXTREMITY ANGIOGRAPHY;  Surgeon: Algernon Huxley, MD;  Location: [place] CV LAB;  Service: Cardiovascular;  Laterality: Left;  . LOWER EXTREMITY ANGIOGRAPHY Left 04/12/2018   Procedure: LOWER EXTREMITY  ANGIOGRAPHY;  Surgeon: Algernon Huxley, MD;  Location: [place] CV LAB;  Service: Cardiovascular;  Laterality: Left;  . PACEMAKER INSERTION    . PROSTATE SURGERY    . PROSTATE SURGERY      Social History        Tobacco Use  . Smoking status: Former Smoker    Last attempt to quit: 03/02/1958    Years since quitting: 60.2  . Smokeless tobacco: Former Systems developer    Types: Chew, Snuff  Substance Use Topics  . Alcohol use: No    Alcohol/week: 0.0 oz  . Drug use: No    Family History      Family History  Problem Relation Age of Onset  . Lung cancer Father     No Known Allergies   REVIEW OF SYSTEMS (Negative unless checked)  Constitutional: [] Weight loss  []   Fever  [] Chills Cardiac: [] Chest pain   [] Chest pressure   [] Palpitations   [] Shortness of breath when laying flat   [] Shortness of breath at rest   [] Shortness of breath with exertion. Vascular:  [x] Pain in legs with walking   [] Pain in legs at rest   [] Pain in legs when laying flat   [] Claudication   [] Pain in feet when walking  [] Pain in feet at rest  [] Pain in feet when laying flat   [] History of DVT   [] Phlebitis   [x] Swelling in legs   [] Varicose veins   [] Non-healing ulcers Pulmonary:   [] Uses home oxygen   [] Productive cough   [] Hemoptysis   [] Wheeze  [] COPD   [] Asthma Neurologic:  [] Dizziness  [] Blackouts   [] Seizures   [] History of stroke   [] History of TIA  [] Aphasia   [] Temporary blindness   [] Dysphagia   [] Weakness or numbness in arms   [] Weakness or numbness in legs Musculoskeletal:  [x] Arthritis   [] Joint swelling   [] Joint pain   [] Low back pain Hematologic:  [] Easy bruising  [] Easy bleeding   [] Hypercoagulable state   [x] Anemic   Gastrointestinal:  [] Blood in stool   [] Vomiting blood  [] Gastroesophageal reflux/heartburn   [] Abdominal pain Genitourinary:  [] Chronic kidney disease   [] Difficult urination  [] Frequent urination  [] Burning with urination   [] Hematuria Skin:  [] Rashes   [] Ulcers    [] Wounds Psychological:  [] History of anxiety   []  History of major depression.    Physical Examination  BP (!) 182/72 (BP Location: Left Arm, Patient Position: Sitting)   Pulse 65   Resp 17   Ht 5\' 10"  (1.778 m)   Wt 165 lb (74.8 kg)   BMI 23.68 kg/m  Gen:  WD/WN, NAD.  Appears younger than stated age Head: [place]/AT, No temporalis wasting. Ear/Nose/Throat: Hearing grossly intact, nares w/o erythema or drainage Eyes: Conjunctiva clear. Sclera non-icteric Neck: Supple.  Trachea midline Pulmonary:  Good air movement, no use of accessory muscles.  Cardiac: Irregular Vascular:  Vessel Right Left  Radial Palpable Palpable                          PT 1+ Palpable Not Palpable  DP Not Palpable 1+ Palpable    Musculoskeletal: M/S 5/5 throughout.  No deformity or atrophy. 1+ RLE edema, 1-2+ LLE edema.  His bypass graft is easily palpable with a strong pulse in the calf area on the left.  Walking with a walker now. Neurologic: Sensation grossly intact in extremities.  Symmetrical.  Speech is fluent.  Psychiatric: Judgment intact, Mood & affect appropriate for pt's clinical situation. Dermatologic: No rashes or ulcers noted.  No cellulitis or open wounds.       Labs Recent Results (from the past 2160 hour(s))  CBC     Status: Abnormal   Collection Time: 07/16/18  9:35 AM  Result Value Ref Range   WBC 9.8 3.8 - 10.6 K/uL   RBC 3.73 (L) 4.40 - 5.90 MIL/uL   Hemoglobin 10.7 (L) 13.0 - 18.0 g/dL   HCT 32.4 (L) 40.0 - 52.0 %   MCV 86.7 80.0 - 100.0 fL   MCH 28.6 26.0 - 34.0 pg   MCHC 33.0 32.0 - 36.0 g/dL   RDW 18.8 (H) 11.5 - 14.5 %   Platelets 211 150 - 440 K/uL    Comment: Performed at [facility], [address] [place], [place] [address]  Vitamin B12  Status: Abnormal   Collection Time: 08/27/18  9:57 AM  Result Value Ref Range   Vitamin B-12 1,289 (H) 180 - 914 pg/mL    Comment: (NOTE) This assay is not validated for testing neonatal  or myeloproliferative syndrome specimens for Vitamin B12 levels. Performed at [facility], [place] [address], [place], [place] [address]   Iron and TIBC     Status: Abnormal   Collection Time: 08/27/18  9:57 AM  Result Value Ref Range   Iron 38 (L) 45 - 182 ug/dL   TIBC 222 (L) 250 - 450 ug/dL   Saturation Ratios 17 (L) 17.9 - 39.5 %   UIBC 185 ug/dL    Comment: Performed at [facility], [address] [place], [place] [address]  Ferritin     Status: None   Collection Time: 08/27/18  9:57 AM  Result Value Ref Range   Ferritin 298 24 - 336 ng/mL    Comment: Performed at [facility], [address] [place], [place] [address]  CBC     Status: Abnormal   Collection Time: 08/27/18  9:57 AM  Result Value Ref Range   WBC 9.4 3.8 - 10.6 K/uL   RBC 3.81 (L) 4.40 - 5.90 MIL/uL   Hemoglobin 11.2 (L) 13.0 - 18.0 g/dL   HCT 33.5 (L) 40.0 - 52.0 %   MCV 87.9 80.0 - 100.0 fL   MCH 29.3 26.0 - 34.0 pg   MCHC 33.3 32.0 - 36.0 g/dL   RDW 17.7 (H) 11.5 - 14.5 %   Platelets 203 150 - 440 K/uL    Comment: Performed at [facility], [address], [place], [place] [address]    Radiology No results found.  Assessment/Plan HTN (hypertension) blood pressure control important in reducing the progression of atherosclerotic disease. On appropriate oral medications.  CKD (chronic kidney disease), stage III Hydration important with each contrast procedure.  HLD (hyperlipidemia) lipid control important in reducing the progression of atherosclerotic disease. 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  His femoral to distal bypass in the left leg is patent.  His ABIs are 0.84 on the right and 0.91 on the left with normal digital pressures.  His popliteal aneurysm is  much smaller on the left now measuring only about 4.2 cm and is decreased on the right as well measuring 4.1 cm. His leg swelling is much better and he is doing quite well from a vascular standpoint.  We will extend his follow-up to 6 months.  He will contact our office with any problems in the interim.    Leotis Pain, MD  09/14/2018 4:37 PM    This note was created with Dragon medical transcription system.  Any errors from dictation are purely unintentional

## 2018-09-24 ENCOUNTER — Ambulatory Visit (INDEPENDENT_AMBULATORY_CARE_PROVIDER_SITE_OTHER): Payer: Medicare HMO | Admitting: Vascular Surgery

## 2018-09-24 ENCOUNTER — Encounter (INDEPENDENT_AMBULATORY_CARE_PROVIDER_SITE_OTHER): Payer: Medicare HMO

## 2018-09-27 DIAGNOSIS — R809 Proteinuria, unspecified: Secondary | ICD-10-CM | POA: Diagnosis not present

## 2018-09-27 DIAGNOSIS — N2581 Secondary hyperparathyroidism of renal origin: Secondary | ICD-10-CM | POA: Diagnosis not present

## 2018-09-27 DIAGNOSIS — I129 Hypertensive chronic kidney disease with stage 1 through stage 4 chronic kidney disease, or unspecified chronic kidney disease: Secondary | ICD-10-CM | POA: Diagnosis not present

## 2018-09-27 DIAGNOSIS — D631 Anemia in chronic kidney disease: Secondary | ICD-10-CM | POA: Diagnosis not present

## 2018-09-27 DIAGNOSIS — N183 Chronic kidney disease, stage 3 (moderate): Secondary | ICD-10-CM | POA: Diagnosis not present

## 2018-10-03 ENCOUNTER — Encounter: Payer: Self-pay | Admitting: Radiology

## 2018-10-03 ENCOUNTER — Observation Stay
Admission: EM | Admit: 2018-10-03 | Discharge: 2018-10-07 | Disposition: A | Discharge status: Skilled Nursing Facility | Payer: Medicare HMO | Attending: Internal Medicine | Admitting: Internal Medicine

## 2018-10-03 ENCOUNTER — Emergency Department: Payer: Medicare HMO

## 2018-10-03 ENCOUNTER — Other Ambulatory Visit: Payer: Self-pay

## 2018-10-03 DIAGNOSIS — Z9862 Peripheral vascular angioplasty status: Secondary | ICD-10-CM | POA: Insufficient documentation

## 2018-10-03 DIAGNOSIS — D509 Iron deficiency anemia, unspecified: Secondary | ICD-10-CM | POA: Diagnosis not present

## 2018-10-03 DIAGNOSIS — S32501A Unspecified fracture of right pubis, initial encounter for closed fracture: Secondary | ICD-10-CM | POA: Diagnosis not present

## 2018-10-03 DIAGNOSIS — Z950 Presence of cardiac pacemaker: Secondary | ICD-10-CM | POA: Insufficient documentation

## 2018-10-03 DIAGNOSIS — W1839XA Other fall on same level, initial encounter: Secondary | ICD-10-CM | POA: Diagnosis not present

## 2018-10-03 DIAGNOSIS — M545 Low back pain: Secondary | ICD-10-CM | POA: Diagnosis not present

## 2018-10-03 DIAGNOSIS — W19XXXA Unspecified fall, initial encounter: Secondary | ICD-10-CM | POA: Diagnosis not present

## 2018-10-03 DIAGNOSIS — Z8546 Personal history of malignant neoplasm of prostate: Secondary | ICD-10-CM | POA: Insufficient documentation

## 2018-10-03 DIAGNOSIS — K219 Gastro-esophageal reflux disease without esophagitis: Secondary | ICD-10-CM | POA: Insufficient documentation

## 2018-10-03 DIAGNOSIS — M25551 Pain in right hip: Secondary | ICD-10-CM | POA: Diagnosis not present

## 2018-10-03 DIAGNOSIS — Z6824 Body mass index (BMI) 24.0-24.9, adult: Secondary | ICD-10-CM | POA: Insufficient documentation

## 2018-10-03 DIAGNOSIS — I509 Heart failure, unspecified: Secondary | ICD-10-CM | POA: Diagnosis not present

## 2018-10-03 DIAGNOSIS — R9431 Abnormal electrocardiogram [ECG] [EKG]: Secondary | ICD-10-CM | POA: Diagnosis not present

## 2018-10-03 DIAGNOSIS — Z9841 Cataract extraction status, right eye: Secondary | ICD-10-CM | POA: Insufficient documentation

## 2018-10-03 DIAGNOSIS — Z79899 Other long term (current) drug therapy: Secondary | ICD-10-CM | POA: Insufficient documentation

## 2018-10-03 DIAGNOSIS — K210 Gastro-esophageal reflux disease with esophagitis: Secondary | ICD-10-CM | POA: Insufficient documentation

## 2018-10-03 DIAGNOSIS — W010XXA Fall on same level from slipping, tripping and stumbling without subsequent striking against object, initial encounter: Secondary | ICD-10-CM | POA: Insufficient documentation

## 2018-10-03 DIAGNOSIS — I700 Atherosclerosis of aorta: Secondary | ICD-10-CM | POA: Insufficient documentation

## 2018-10-03 DIAGNOSIS — Z7951 Long term (current) use of inhaled steroids: Secondary | ICD-10-CM | POA: Insufficient documentation

## 2018-10-03 DIAGNOSIS — Z23 Encounter for immunization: Secondary | ICD-10-CM | POA: Diagnosis not present

## 2018-10-03 DIAGNOSIS — Z96652 Presence of left artificial knee joint: Secondary | ICD-10-CM | POA: Insufficient documentation

## 2018-10-03 DIAGNOSIS — Y929 Unspecified place or not applicable: Secondary | ICD-10-CM | POA: Diagnosis not present

## 2018-10-03 DIAGNOSIS — K922 Gastrointestinal hemorrhage, unspecified: Secondary | ICD-10-CM | POA: Insufficient documentation

## 2018-10-03 DIAGNOSIS — S79921A Unspecified injury of right thigh, initial encounter: Secondary | ICD-10-CM | POA: Diagnosis not present

## 2018-10-03 DIAGNOSIS — I714 Abdominal aortic aneurysm, without rupture: Secondary | ICD-10-CM | POA: Diagnosis not present

## 2018-10-03 DIAGNOSIS — M1990 Unspecified osteoarthritis, unspecified site: Secondary | ICD-10-CM | POA: Insufficient documentation

## 2018-10-03 DIAGNOSIS — Z8711 Personal history of peptic ulcer disease: Secondary | ICD-10-CM | POA: Diagnosis not present

## 2018-10-03 DIAGNOSIS — Y9389 Activity, other specified: Secondary | ICD-10-CM | POA: Insufficient documentation

## 2018-10-03 DIAGNOSIS — I723 Aneurysm of iliac artery: Secondary | ICD-10-CM | POA: Insufficient documentation

## 2018-10-03 DIAGNOSIS — S76919A Strain of unspecified muscles, fascia and tendons at thigh level, unspecified thigh, initial encounter: Secondary | ICD-10-CM | POA: Insufficient documentation

## 2018-10-03 DIAGNOSIS — S3992XA Unspecified injury of lower back, initial encounter: Secondary | ICD-10-CM | POA: Diagnosis not present

## 2018-10-03 DIAGNOSIS — S32599A Other specified fracture of unspecified pubis, initial encounter for closed fracture: Secondary | ICD-10-CM | POA: Diagnosis present

## 2018-10-03 DIAGNOSIS — I495 Sick sinus syndrome: Secondary | ICD-10-CM | POA: Diagnosis not present

## 2018-10-03 DIAGNOSIS — M6289 Other specified disorders of muscle: Secondary | ICD-10-CM | POA: Diagnosis not present

## 2018-10-03 DIAGNOSIS — M8580 Other specified disorders of bone density and structure, unspecified site: Secondary | ICD-10-CM | POA: Insufficient documentation

## 2018-10-03 DIAGNOSIS — S79911A Unspecified injury of right hip, initial encounter: Secondary | ICD-10-CM | POA: Diagnosis not present

## 2018-10-03 DIAGNOSIS — I4891 Unspecified atrial fibrillation: Secondary | ICD-10-CM | POA: Diagnosis not present

## 2018-10-03 DIAGNOSIS — I429 Cardiomyopathy, unspecified: Secondary | ICD-10-CM | POA: Insufficient documentation

## 2018-10-03 DIAGNOSIS — Z801 Family history of malignant neoplasm of trachea, bronchus and lung: Secondary | ICD-10-CM | POA: Insufficient documentation

## 2018-10-03 DIAGNOSIS — I130 Hypertensive heart and chronic kidney disease with heart failure and stage 1 through stage 4 chronic kidney disease, or unspecified chronic kidney disease: Secondary | ICD-10-CM | POA: Diagnosis not present

## 2018-10-03 DIAGNOSIS — E43 Unspecified severe protein-calorie malnutrition: Secondary | ICD-10-CM | POA: Insufficient documentation

## 2018-10-03 DIAGNOSIS — R102 Pelvic and perineal pain: Secondary | ICD-10-CM | POA: Diagnosis not present

## 2018-10-03 DIAGNOSIS — E785 Hyperlipidemia, unspecified: Secondary | ICD-10-CM | POA: Insufficient documentation

## 2018-10-03 DIAGNOSIS — I739 Peripheral vascular disease, unspecified: Secondary | ICD-10-CM | POA: Insufficient documentation

## 2018-10-03 DIAGNOSIS — K449 Diaphragmatic hernia without obstruction or gangrene: Secondary | ICD-10-CM | POA: Diagnosis not present

## 2018-10-03 DIAGNOSIS — Z9842 Cataract extraction status, left eye: Secondary | ICD-10-CM | POA: Insufficient documentation

## 2018-10-03 DIAGNOSIS — Z7982 Long term (current) use of aspirin: Secondary | ICD-10-CM | POA: Insufficient documentation

## 2018-10-03 DIAGNOSIS — N183 Chronic kidney disease, stage 3 (moderate): Secondary | ICD-10-CM | POA: Insufficient documentation

## 2018-10-03 DIAGNOSIS — S3993XA Unspecified injury of pelvis, initial encounter: Secondary | ICD-10-CM | POA: Diagnosis not present

## 2018-10-03 DIAGNOSIS — S32591A Other specified fracture of right pubis, initial encounter for closed fracture: Secondary | ICD-10-CM | POA: Diagnosis present

## 2018-10-03 DIAGNOSIS — Z87891 Personal history of nicotine dependence: Secondary | ICD-10-CM | POA: Insufficient documentation

## 2018-10-03 LAB — CBC WITH DIFFERENTIAL/PLATELET
ABS IMMATURE GRANULOCYTES: 0.1 10*3/uL — AB (ref 0.00–0.07)
BASOS ABS: 0 10*3/uL (ref 0.0–0.1)
Basophils Relative: 0 %
EOS PCT: 0 %
Eosinophils Absolute: 0 10*3/uL (ref 0.0–0.5)
HCT: 29.4 % — ABNORMAL LOW (ref 39.0–52.0)
Hemoglobin: 9.4 g/dL — ABNORMAL LOW (ref 13.0–17.0)
Immature Granulocytes: 1 %
LYMPHS ABS: 0.5 10*3/uL — AB (ref 0.7–4.0)
LYMPHS PCT: 3 %
MCH: 29.5 pg (ref 26.0–34.0)
MCHC: 32 g/dL (ref 30.0–36.0)
MCV: 92.2 fL (ref 80.0–100.0)
MONO ABS: 0.7 10*3/uL (ref 0.1–1.0)
Monocytes Relative: 5 %
NEUTROS ABS: 12.8 10*3/uL — AB (ref 1.7–7.7)
NRBC: 0 % (ref 0.0–0.2)
Neutrophils Relative %: 91 %
PLATELETS: 183 10*3/uL (ref 150–400)
RBC: 3.19 MIL/uL — AB (ref 4.22–5.81)
RDW: 15.8 % — ABNORMAL HIGH (ref 11.5–15.5)
WBC: 14.2 10*3/uL — AB (ref 4.0–10.5)

## 2018-10-03 LAB — BASIC METABOLIC PANEL
ANION GAP: 8 (ref 5–15)
BUN: 29 mg/dL — ABNORMAL HIGH (ref 8–23)
CO2: 24 mmol/L (ref 22–32)
Calcium: 9.6 mg/dL (ref 8.9–10.3)
Chloride: 103 mmol/L (ref 98–111)
Creatinine, Ser: 1.63 mg/dL — ABNORMAL HIGH (ref 0.61–1.24)
GFR calc Af Amer: 40 mL/min — ABNORMAL LOW (ref >60)
GFR, EST NON AFRICAN AMERICAN: 35 mL/min — AB (ref >60)
Glucose, Bld: 141 mg/dL — ABNORMAL HIGH (ref 70–99)
Potassium: 4 mmol/L (ref 3.5–5.1)
SODIUM: 135 mmol/L (ref 135–145)

## 2018-10-03 MED ORDER — AMLODIPINE BESYLATE 5 MG PO TABS
2.5000 mg | ORAL_TABLET | Freq: Every day | ORAL | Status: DC
  Administered 2018-10-03 – 2018-10-07 (×5): 2.5 mg via ORAL
  Filled 2018-10-03 (×5): qty 1

## 2018-10-03 MED ORDER — VITAMIN B-12 1000 MCG PO TABS
1000.0000 ug | ORAL_TABLET | Freq: Every day | ORAL | Status: DC
  Administered 2018-10-05 – 2018-10-07 (×3): 1000 ug via ORAL
  Filled 2018-10-03 (×4): qty 1

## 2018-10-03 MED ORDER — DOCUSATE SODIUM 100 MG PO CAPS
100.0000 mg | ORAL_CAPSULE | Freq: Every day | ORAL | Status: DC
  Administered 2018-10-04 – 2018-10-07 (×4): 100 mg via ORAL
  Filled 2018-10-03 (×4): qty 1

## 2018-10-03 MED ORDER — MORPHINE SULFATE (PF) 2 MG/ML IV SOLN: 2.0000 mg | INTRAVENOUS | Status: DC | PRN

## 2018-10-03 MED ORDER — PANTOPRAZOLE SODIUM 40 MG PO TBEC
40.0000 mg | DELAYED_RELEASE_TABLET | Freq: Every day | ORAL | Status: DC
  Administered 2018-10-03 – 2018-10-07 (×5): 40 mg via ORAL
  Filled 2018-10-03 (×5): qty 1

## 2018-10-03 MED ORDER — METOPROLOL TARTRATE 25 MG PO TABS
25.0000 mg | ORAL_TABLET | Freq: Two times a day (BID) | ORAL | Status: DC
  Administered 2018-10-03 – 2018-10-07 (×8): 25 mg via ORAL
  Filled 2018-10-03 (×8): qty 1

## 2018-10-03 MED ORDER — SODIUM CHLORIDE 0.9% FLUSH: 3.0000 mL | INTRAVENOUS | Status: DC | PRN

## 2018-10-03 MED ORDER — SIMVASTATIN 20 MG PO TABS
40.0000 mg | ORAL_TABLET | Freq: Every day | ORAL | Status: DC
  Administered 2018-10-03 – 2018-10-06 (×3): 40 mg via ORAL
  Filled 2018-10-03 (×3): qty 2

## 2018-10-03 MED ORDER — ALBUTEROL SULFATE (2.5 MG/3ML) 0.083% IN NEBU: 3.0000 mL | INHALATION_SOLUTION | Freq: Four times a day (QID) | RESPIRATORY_TRACT | Status: DC | PRN

## 2018-10-03 MED ORDER — ACETAMINOPHEN 650 MG RE SUPP: 650.0000 mg | Freq: Four times a day (QID) | RECTAL | Status: DC | PRN

## 2018-10-03 MED ORDER — ONDANSETRON HCL 4 MG/2ML IJ SOLN: 4.0000 mg | Freq: Four times a day (QID) | INTRAMUSCULAR | Status: DC | PRN

## 2018-10-03 MED ORDER — INFLUENZA VAC SPLIT HIGH-DOSE 0.5 ML IM SUSY
0.5000 mL | PREFILLED_SYRINGE | INTRAMUSCULAR | Status: AC
  Administered 2018-10-05: 0.5 mL via INTRAMUSCULAR
  Filled 2018-10-03: qty 0.5

## 2018-10-03 MED ORDER — SODIUM CHLORIDE 0.9% FLUSH
3.0000 mL | Freq: Two times a day (BID) | INTRAVENOUS | Status: DC
  Administered 2018-10-04 – 2018-10-07 (×5): 3 mL via INTRAVENOUS

## 2018-10-03 MED ORDER — ONDANSETRON HCL 4 MG PO TABS: 4.0000 mg | ORAL_TABLET | Freq: Four times a day (QID) | ORAL | Status: DC | PRN

## 2018-10-03 MED ORDER — SENNOSIDES-DOCUSATE SODIUM 8.6-50 MG PO TABS
1.0000 | ORAL_TABLET | Freq: Every evening | ORAL | Status: DC | PRN
  Administered 2018-10-05: 1 via ORAL
  Filled 2018-10-03: qty 1

## 2018-10-03 MED ORDER — ACETAMINOPHEN 325 MG PO TABS: 650.0000 mg | ORAL_TABLET | Freq: Four times a day (QID) | ORAL | Status: DC | PRN

## 2018-10-03 MED ORDER — TRAMADOL HCL 50 MG PO TABS
50.0000 mg | ORAL_TABLET | Freq: Four times a day (QID) | ORAL | Status: DC | PRN
  Administered 2018-10-03 – 2018-10-05 (×2): 50 mg via ORAL
  Filled 2018-10-03 (×2): qty 1

## 2018-10-03 MED ORDER — IOPAMIDOL (ISOVUE-300) INJECTION 61%
75.0000 mL | Freq: Once | INTRAVENOUS | Status: AC | PRN
  Administered 2018-10-03: 75 mL via INTRAVENOUS

## 2018-10-03 MED ORDER — ENSURE ENLIVE PO LIQD
237.0000 mL | Freq: Two times a day (BID) | ORAL | Status: DC
  Administered 2018-10-04 – 2018-10-06 (×5): 237 mL via ORAL

## 2018-10-03 MED ORDER — TRAZODONE HCL 50 MG PO TABS
50.0000 mg | ORAL_TABLET | Freq: Every evening | ORAL | Status: DC | PRN
  Administered 2018-10-06: 50 mg via ORAL
  Filled 2018-10-03: qty 1

## 2018-10-03 MED ORDER — SODIUM CHLORIDE 0.9 % IV SOLN: 250.0000 mL | INTRAVENOUS | Status: DC | PRN

## 2018-10-03 MED ORDER — IOPAMIDOL (ISOVUE-300) INJECTION 61%: 50.0000 mL | Freq: Once | INTRAVENOUS | Status: DC | PRN

## 2018-10-03 MED ORDER — FENTANYL CITRATE (PF) 100 MCG/2ML IJ SOLN
25.0000 ug | INTRAMUSCULAR | Status: DC | PRN
  Administered 2018-10-03: 25 ug via INTRAVENOUS
  Filled 2018-10-03: qty 2

## 2018-10-03 MED ORDER — FERROUS SULFATE 325 (65 FE) MG PO TABS
325.0000 mg | ORAL_TABLET | Freq: Two times a day (BID) | ORAL | Status: DC
  Administered 2018-10-04 – 2018-10-07 (×6): 325 mg via ORAL
  Filled 2018-10-03 (×6): qty 1

## 2018-10-03 MED ORDER — UMECLIDINIUM-VILANTEROL 62.5-25 MCG/INH IN AEPB
1.0000 | INHALATION_SPRAY | Freq: Two times a day (BID) | RESPIRATORY_TRACT | Status: DC
  Administered 2018-10-03 – 2018-10-04 (×2): 1 via RESPIRATORY_TRACT
  Filled 2018-10-03: qty 14

## 2018-10-03 MED ORDER — BENAZEPRIL HCL 20 MG PO TABS
20.0000 mg | ORAL_TABLET | Freq: Every day | ORAL | Status: DC
  Administered 2018-10-03 – 2018-10-07 (×5): 20 mg via ORAL
  Filled 2018-10-03 (×5): qty 1

## 2018-10-03 NOTE — ED Notes (Signed)
Report called and pt ready to go to 1a

## 2018-10-03 NOTE — Progress Notes (Signed)
Advanced care plan.  Purpose of the Encounter: CODE STATUS  Parties in Attendance: Patient  Patient's Decision Capacity: Good  Subjective/Patient's story: Presented to emergency room for fall and pain in the right hip   Objective/Medical story Has a right pubic rami fracture Has intramuscular hemorrhage in the right thigh secondary to fall Needs orthopedic surgery evaluation and pain management   Goals of care determination:  Advance care directives and goals of care discussed Patient does not want any CPR, intubation ventilator if the need arises   CODE STATUS: DNR   Time spent discussing advanced care planning: 16 minutes

## 2018-10-03 NOTE — ED Triage Notes (Signed)
Fall in bedroom. R sided buttock and leg pain. Non-weight bearing getting out of car. A&O x 4. Family with pt.

## 2018-10-03 NOTE — H&P (Signed)
[facility] [place] NAME: Devin Washington    MR#:  [id number]  DATE OF BIRTH:  [birth year]  DATE OF ADMISSION:  10/03/2018  PRIMARY CARE PHYSICIAN: Albina Billet, MD   REQUESTING/REFERRING PHYSICIAN:   CHIEF COMPLAINT:   Chief Complaint  Patient presents with  . Fall    HISTORY OF PRESENT ILLNESS: Devin Washington  is a 84 y.o. male with a known history of cardiomyopathy, congestive heart failure, hiatal hernia, hyperlipidemia, hypertension, peripheral arterial disease, pacemaker, prostate cancer presented to the emergency room for fall.  Patient got up this morning lights for switched off and he wanted to go to the restroom.  He lost balance and fell down and landed on right hip.  Patient has pain in the right hip which is aching in nature 6 out of 10 on a scale of 1-10.  He also complains of pain in the right thigh.  Patient was worked up with x-ray of the pelvis which showed a right pubic ramus fracture.  Was also worked up with CT pelvis which showed strain in the right abductor muscle compartment with intramuscular hemorrhage.  Advised monitoring.  Orthopedic surgery was consulted by ER physician.  PAST MEDICAL HISTORY:   Past Medical History:  Diagnosis Date  . A-fib ([place])   . Cardiomyopathy ([place])   . CHF (congestive heart failure) ([place])   . DJD (degenerative joint disease)   . Duodenal ulcer 08/09/2015  . Dysrhythmia   . Erosive esophagitis   . HH (hiatus hernia)   . HLD (hyperlipidemia)   . Hypertension   . Pacemaker   . PAD (peripheral artery disease) ([place])   . Peripheral vascular disease ([place])   . Presence of permanent cardiac pacemaker   . Prostate cancer [facility])    Prostatectomy.  . 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 Shortness of breath   . SSS (sick sinus syndrome) ([place])     PAST SURGICAL HISTORY:  Past Surgical History:  Procedure Laterality Date  . CATARACT EXTRACTION, BILATERAL    . COLONOSCOPY N/A 04/04/2018    Procedure: COLONOSCOPY;  Surgeon: Lin Landsman, MD;  Location: [facility] ENDOSCOPY;  Service: Gastroenterology;  Laterality: N/A;  . ESOPHAGOGASTRODUODENOSCOPY N/A 08/09/2015   Procedure: ESOPHAGOGASTRODUODENOSCOPY (EGD) looking in the stomach with a lighted tube to evaluate and treat;  Surgeon: Lollie Sails, MD;  Location: [facility] ENDOSCOPY;  Service: Endoscopy;  Laterality: N/A;  Procedure to be done tomorrow afternoon, 08/08/2015. Awaiting cardiology consult  . ESOPHAGOGASTRODUODENOSCOPY N/A 04/04/2018   Procedure: ESOPHAGOGASTRODUODENOSCOPY (EGD);  Surgeon: Lin Landsman, MD;  Location: [facility] ENDOSCOPY;  Service: Gastroenterology;  Laterality: N/A;  . EYE SURGERY    . FEMORAL-POPLITEAL BYPASS GRAFT Left 12/16/2017   Procedure: BYPASS GRAFT FEMORAL-POPLITEAL ARTERY ( OPEN POPLITEAL BYPASS );  Surgeon: Algernon Huxley, MD;  Location: [facility] ORS;  Service: Vascular;  Laterality: Left;  . FLEXIBLE SIGMOIDOSCOPY N/A 08/09/2015   Procedure: FLEXIBLE SIGMOIDOSCOPY looking up the rectum into the distal colon with a lighted tube to examine and treat;  Surgeon: Lollie Sails, MD;  Location: [facility] ENDOSCOPY;  Service: Endoscopy;  Laterality: N/A;  Procedure to be done tomorrow afternoon, 08/08/2015. Awaiting cardiology consult.  . INSERT / REPLACE / REMOVE PACEMAKER    . JOINT REPLACEMENT Left    left knee replacement  . LOWER EXTREMITY ANGIOGRAPHY Left 11/16/2017   Procedure: LOWER EXTREMITY ANGIOGRAPHY;  Surgeon: Algernon Huxley, MD;  Location: [place] CV LAB;  Service: Cardiovascular;  Laterality:  Left;  . LOWER EXTREMITY ANGIOGRAPHY Right 11/30/2017   Procedure: LOWER EXTREMITY ANGIOGRAPHY;  Surgeon: Algernon Huxley, MD;  Location: [place] CV LAB;  Service: Cardiovascular;  Laterality: Right;  . LOWER EXTREMITY ANGIOGRAPHY Left 03/04/2018   Procedure: LOWER EXTREMITY ANGIOGRAPHY;  Surgeon: Algernon Huxley, MD;  Location: [facility] CV LAB;  Service: Cardiovascular;  Laterality: Left;  . LOWER  EXTREMITY ANGIOGRAPHY Left 04/12/2018   Procedure: LOWER EXTREMITY ANGIOGRAPHY;  Surgeon: Algernon Huxley, MD;  Location: [place] CV LAB;  Service: Cardiovascular;  Laterality: Left;  . PACEMAKER INSERTION    . PROSTATE SURGERY    . PROSTATE SURGERY      SOCIAL HISTORY:  Social History   Tobacco Use  . Smoking status: Former Smoker    Last attempt to quit: 03/02/1958    Years since quitting: 60.6  . Smokeless tobacco: Former Systems developer    Types: Chew, Snuff  Substance Use Topics  . Alcohol use: No    Alcohol/week: 0.0 standard drinks    FAMILY HISTORY:  Family History  Problem Relation Age of Onset  . Lung cancer Father     DRUG ALLERGIES: No Known Allergies  REVIEW OF SYSTEMS:   CONSTITUTIONAL: No fever, fatigue or weakness.  EYES: No blurred or double vision.  EARS, NOSE, AND THROAT: No tinnitus or ear pain.  RESPIRATORY: No cough, shortness of breath, wheezing or hemoptysis.  CARDIOVASCULAR: No chest pain, orthopnea, edema.  GASTROINTESTINAL: No nausea, vomiting, diarrhea or abdominal pain.  GENITOURINARY: No dysuria, hematuria.  ENDOCRINE: No polyuria, nocturia,  HEMATOLOGY: No anemia, easy bruising or bleeding SKIN: No rash or lesion. MUSCULOSKELETAL: Right hip pain Right thigh pain NEUROLOGIC: No tingling, numbness, weakness.  PSYCHIATRY: No anxiety or depression.   MEDICATIONS AT HOME:  Prior to Admission medications   Medication Sig Start Date End Date Taking? Authorizing Provider  acetaminophen (TYLENOL) 325 MG tablet Take 2 tablets (650 mg total) by mouth every 6 (six) hours as needed for mild pain (or Fever >/= 101). 04/06/18  Yes Gouru, Aruna, MD  amLODipine (NORVASC) 2.5 MG tablet Take 2.5 mg by mouth daily.  08/31/18  Yes [provider]  ANORO ELLIPTA 62.5-25 MCG/INH AEPB Inhale 1 puff into the lungs 2 (two) times daily. 04/13/17  Yes [provider]  aspirin EC 81 MG EC tablet Take 1 tablet (81 mg total) by mouth daily. 12/22/17  Yes Dew,  Erskine Squibb, MD  benazepril (LOTENSIN) 20 MG tablet Take 1 tablet (20 mg total) by mouth daily. 04/06/18  Yes Gouru, Illene Silver, MD  docusate sodium (COLACE) 100 MG capsule Take 1 capsule (100 mg total) by mouth daily. 04/06/18  Yes Gouru, Illene Silver, MD  feeding supplement, ENSURE ENLIVE, (ENSURE ENLIVE) LIQD Take 237 mLs by mouth 2 (two) times daily between meals. 04/07/18  Yes Gouru, Illene Silver, MD  ferrous sulfate 325 (65 FE) MG tablet Take 1 tablet (325 mg total) by mouth 2 (two) times daily with a meal. 04/06/18  Yes Gouru, Aruna, MD  furosemide (LASIX) 20 MG tablet Take 1 tablet by mouth daily. 04/23/17  Yes [provider]  metoprolol tartrate (LOPRESSOR) 25 MG tablet Take 1 tablet (25 mg total) by mouth 2 (two) times daily. 04/06/18  Yes Gouru, Illene Silver, MD  pantoprazole (PROTONIX) 40 MG tablet Take 1 tablet (40 mg total) by mouth daily. 04/06/18  Yes Gouru, Illene Silver, MD  simvastatin (ZOCOR) 20 MG tablet Take 20 mg by mouth daily.    Yes [provider]  traMADol Veatrice Bourbon)  50 MG tablet Take 1 tablet (50 mg total) by mouth every 6 (six) hours as needed for moderate pain or severe pain. Patient taking differently: Take 50 mg by mouth 3 (three) times daily as needed for moderate pain or severe pain.  04/06/18  Yes Gouru, Illene Silver, MD  traZODone (DESYREL) 50 MG tablet Take 1 tablet by mouth at bedtime as needed for sleep.  04/13/17  Yes [provider]  vitamin B-12 1000 MCG tablet Take 1 tablet (1,000 mcg total) by mouth daily. 04/07/18  Yes Gouru, Illene Silver, MD  albuterol (PROVENTIL HFA;VENTOLIN HFA) 108 (90 Base) MCG/ACT inhaler Inhale 2 puffs into the lungs every 6 (six) hours as needed for wheezing or shortness of breath. 04/23/17   Lavonia Drafts, MD      PHYSICAL EXAMINATION:   VITAL SIGNS: Blood pressure 136/76, pulse 91, temperature 97.8 F (36.6 C), temperature source Oral, resp. rate 16, height 5\' 10"  (1.778 m), weight 76.7 kg, SpO2 95 %.  GENERAL:  84 y.o.-year-old patient lying in the bed with  no acute distress.  EYES: Pupils equal, round, reactive to light and accommodation. No scleral icterus. Extraocular muscles intact.  HEENT: Head atraumatic, normocephalic. Oropharynx and nasopharynx clear.  NECK:  Supple, no jugular venous distention. No thyroid enlargement, no tenderness.  LUNGS: Normal breath sounds bilaterally, no wheezing, rales,rhonchi or crepitation. No use of accessory muscles of respiration.  CARDIOVASCULAR: S1, S2 normal. No murmurs, rubs, or gallops.  ABDOMEN: Soft, nontender, nondistended. Bowel sounds present. No organomegaly or mass.  EXTREMITIES: No pedal edema, cyanosis, or clubbing.  Tenderness in the right hip and right thigh NEUROLOGIC: Cranial nerves II through XII are intact. Muscle strength 5/5 in all extremities. Sensation intact. Gait not checked.  PSYCHIATRIC: The patient is alert and oriented x 3.  SKIN: No obvious rash, lesion, or ulcer.   LABORATORY PANEL:   CBC Recent Labs  Lab 10/03/18 1346  WBC 14.2*  HGB 9.4*  HCT 29.4*  PLT 183  MCV 92.2  MCH 29.5  MCHC 32.0  RDW 15.8*  LYMPHSABS 0.5*  MONOABS 0.7  EOSABS 0.0  BASOSABS 0.0   ------------------------------------------------------------------------------------------------------------------  Chemistries  Recent Labs  Lab 10/03/18 1346  NA 135  K 4.0  CL 103  CO2 24  GLUCOSE 141*  BUN 29*  CREATININE 1.63*  CALCIUM 9.6   ------------------------------------------------------------------------------------------------------------------ estimated creatinine clearance is 29.2 mL/min (A) (by C-G formula based on SCr of 1.63 mg/dL (H)). ------------------------------------------------------------------------------------------------------------------ No results for input(s): TSH, T4TOTAL, T3FREE, THYROIDAB in the last 72 hours.  Invalid input(s): FREET3   Coagulation profile No results for input(s): INR, PROTIME in the last 168  hours. ------------------------------------------------------------------------------------------------------------------- No results for input(s): DDIMER in the last 72 hours. -------------------------------------------------------------------------------------------------------------------  Cardiac Enzymes No results for input(s): CKMB, TROPONINI, MYOGLOBIN in the last 168 hours.  Invalid input(s): CK ------------------------------------------------------------------------------------------------------------------ Invalid input(s): POCBNP  ---------------------------------------------------------------------------------------------------------------  Urinalysis    Component Value Date/Time   COLORURINE YELLOW (A) 08/06/2015 1621   APPEARANCEUR CLOUDY (A) 08/06/2015 1621   LABSPEC 1.013 08/06/2015 1621   PHURINE 7.0 08/06/2015 1621   GLUCOSEU NEGATIVE 08/06/2015 1621   HGBUR NEGATIVE 08/06/2015 1621   BILIRUBINUR NEGATIVE 08/06/2015 1621   KETONESUR NEGATIVE 08/06/2015 1621   PROTEINUR 30 (A) 08/06/2015 1621   NITRITE NEGATIVE 08/06/2015 1621   LEUKOCYTESUR NEGATIVE 08/06/2015 1621     RADIOLOGY: Dg Lumbar Spine 2-3 Views  Result Date: 10/03/2018 CLINICAL DATA:  Low back pain since a fall this morning. Initial encounter. EXAM: LUMBAR SPINE - 2-3 VIEW  COMPARISON:  CT abdomen and pelvis 07/14/2016. FINDINGS: There is no acute abnormality. The patient has advanced multilevel degenerative disc disease and facet arthropathy. No traumatic listhesis. Aortic atherosclerosis noted. IMPRESSION: No acute finding.  Advanced multilevel degenerative disease. Electronically Signed   By: Inge Rise M.D.   On: 10/03/2018 13:56   Ct Pelvis W Contrast  Result Date: 10/03/2018 CLINICAL DATA:  Pelvis trauma with fracture suspected. Fall with right-sided pain. EXAM: CT PELVIS WITH CONTRAST TECHNIQUE: Multidetector CT imaging of the pelvis was performed using the standard protocol following  the bolus administration of intravenous contrast. CONTRAST:  74mL ISOVUE-300 IOPAMIDOL (ISOVUE-300) INJECTION 61% COMPARISON:  07/14/2016 abdominal CT FINDINGS: Urinary Tract:  Negative bladder Bowel:  No acute finding. Vascular/Lymphatic: Extensive atherosclerotic calcification. Dilated right common iliac artery is with fusiform aneurysm measuring 2.5 cm. There is also lower abdominal aortic dilatation measuring 3.1 cm. Reproductive:  Prostatectomy Other:  Negative Musculoskeletal: Nondisplaced right superior pubic and inferior pubic ramus fracture with avulsed fragment at the abductor compartment. No detectable sacral ala fracture. No sacroiliac or symphysis pubis diastasis. Both hips are located. There is soft tissue stranding about the fractures. The muscles of the right abductor compartment are expanded and there is a wispy high-density that is partially covered within abductor musculature, cannot exclude injured vessel or focus of active bleeding. IMPRESSION: 1. Nondisplaced right inferior and superior pubic ramus fractures. 2. Strain of right abductor compartment with possible focus of active intramuscular hemorrhage (wispy high-density seen on the lowest slice is incompletely covered). Please observe for progressive swelling in the right thigh. 3. 3.1 cm abdominal aortic aneurysm with mild increase from 2017 (when it was 2.8 cm). 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 Electronically Signed   By: Monte Fantasia M.D.   On: 10/03/2018 15:48   Dg Hip Unilat  With Pelvis 2-3 Views Right  Result Date: 10/03/2018 CLINICAL DATA:  Right hip pain post fall. EXAM: DG HIP (WITH OR WITHOUT PELVIS) 2-3V RIGHT COMPARISON:  None. FINDINGS: There is no evidence of right hip fracture or dislocation. Osteopenia. Age-indeterminate fracture of the right superior pubic ramus. Questionable healed fracture of the inferior right pubic ramus. The left pubic bones are incompletely evaluated.  Internal rotation of the left hip, which is also incompletely evaluated. IMPRESSION: No evidence of right hip fracture. Age-indeterminate fracture of the right superior pubic ramus. Questionable healed fracture of the inferior right pubic ramus. Incomplete visualized left pelvic bones, and left hip, which is internally rotated. Please correlate to possible point of tenderness. Electronically Signed   By: Fidela Salisbury M.D.   On: 10/03/2018 14:02   Dg Femur Min 2 Views Right  Result Date: 10/03/2018 CLINICAL DATA:  Right hip pain after fall. EXAM: RIGHT FEMUR 2 VIEWS COMPARISON:  None. FINDINGS: There is no evidence of fracture or other focal bone lesions. Soft tissues are unremarkable. IMPRESSION: Negative. Electronically Signed   By: Marijo Conception, M.D.   On: 10/03/2018 14:00    EKG: Orders placed or performed during the hospital encounter of 10/03/18  . EKG 12-Lead  . EKG 12-Lead    IMPRESSION AND PLAN:  84 year old male patient with history of pacemaker, hypertension, cardiomyopathy, congestive heart failure, peripheral arterial disease, hyperlipidemia, pacemaker presented to the emergency room for fall  -Right pubic ramus fracture Orthopedic surgery consultation Pain management  -Right abductor thigh muscle strain with intramuscular hemorrhage Monitor thigh size for any increase in swelling Orthopedic surgery follow-up Avoid blood thinner medications  such as aspirin Lovenox and heparin  -DVT prophylaxis sequential compression device to lower extremities  -Right thigh and right hip pain Pain management with oral tramadol and PRN IV morphine   All the records are reviewed and case discussed with ED provider. Management plans discussed with the patient, family and they are in agreement.  CODE STATUS: DNR    Code Status Orders  (From admission, onward)         Start     Ordered   10/03/18 1613  Do not attempt resuscitation (DNR)  Continuous    Question Answer  Comment  In the event of cardiac or respiratory ARREST Do not call a "code blue"   In the event of cardiac or respiratory ARREST Do not perform Intubation, CPR, defibrillation or ACLS   In the event of cardiac or respiratory ARREST Use medication by any route, position, wound care, and other measures to relive pain and suffering. May use oxygen, suction and manual treatment of airway obstruction as needed for comfort.      10/03/18 1612        Code Status History    Date Active Date Inactive Code Status Order ID Comments User Context   04/12/2018 1253 04/12/2018 1756 Full Code [id number]  Algernon Huxley, MD Inpatient   04/02/2018 1532 04/06/2018 2023 DNR [id number]  Nicholes Mango, MD Inpatient   04/02/2018 0353 04/02/2018 1531 Full Code [id number]  Harrie Foreman, MD Inpatient   03/04/2018 1221 03/04/2018 1859 Full Code [id number]  Algernon Huxley, MD Inpatient   12/16/2017 1457 12/21/2017 1906 Full Code [id number]  Algernon Huxley, MD Inpatient   11/30/2017 1022 11/30/2017 1714 Full Code [id number]  Algernon Huxley, MD Inpatient   11/16/2017 1209 11/16/2017 1857 Full Code [id number]  Algernon Huxley, MD Inpatient   08/07/2015 0010 08/09/2015 2030 Full Code [id number]  Lance Coon, MD Inpatient       TOTAL TIME TAKING CARE OF THIS PATIENT: 52 minutes.    Saundra Shelling M.D on 10/03/2018 at 4:15 PM  Between 7am to 6pm - Pager - [phone]  After 6pm go to [email or web address] - password [email or web address] [email or web address]  [place] [facility]  Office  [phone]  CC: Primary care physician; Albina Billet, MD

## 2018-10-03 NOTE — ED Notes (Signed)
Pt finished lunch tray

## 2018-10-03 NOTE — ED Provider Notes (Signed)
[facility] Emergency Department Provider Note    First MD Initiated Contact with Patient 10/03/18 1252     (approximate)  I have reviewed the triage vital signs and the nursing notes.   HISTORY  Chief Complaint Fall    HPI Devin Washington is a 84 y.o. male extensive past medical history as listed below presents the ER with chief complaint of right buttock and leg pain that occurred after he had a mechanical fall earlier this morning around 5:00.  Patient was getting up out of bed and states it was dark.  Did not hit his head.  States he is having moderate to severe pain in the right leg.  Is been unable to put any weight on his leg.  Was brought by family.  Denies any chest pain or shortness of breath.  Denies any palpitations.  No abdominal pain.    Past Medical History:  Diagnosis Date  . A-fib ([place])   . Cardiomyopathy ([place])   . CHF (congestive heart failure) ([place])   . DJD (degenerative joint disease)   . Duodenal ulcer 08/09/2015  . Dysrhythmia   . Erosive esophagitis   . HH (hiatus hernia)   . HLD (hyperlipidemia)   . Hypertension   . Pacemaker   . PAD (peripheral artery disease) ([place])   . Peripheral vascular disease ([place])   . Presence of permanent cardiac pacemaker   . Prostate cancer [facility])    Prostatectomy.  . 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 Shortness of breath   . SSS (sick sinus syndrome) (HCC)    Family History  Problem Relation Age of Onset  . Lung cancer Father    Past Surgical History:  Procedure Laterality Date  . CATARACT EXTRACTION, BILATERAL    . COLONOSCOPY N/A 04/04/2018   Procedure: COLONOSCOPY;  Surgeon: Lin Landsman, MD;  Location: [facility] ENDOSCOPY;  Service: Gastroenterology;  Laterality: N/A;  . ESOPHAGOGASTRODUODENOSCOPY N/A 08/09/2015   Procedure: ESOPHAGOGASTRODUODENOSCOPY (EGD) looking in the stomach with a lighted tube to evaluate and treat;  Surgeon: Lollie Sails, MD;  Location: [facility] ENDOSCOPY;  Service:  Endoscopy;  Laterality: N/A;  Procedure to be done tomorrow afternoon, 08/08/2015. Awaiting cardiology consult  . ESOPHAGOGASTRODUODENOSCOPY N/A 04/04/2018   Procedure: ESOPHAGOGASTRODUODENOSCOPY (EGD);  Surgeon: Lin Landsman, MD;  Location: [facility] ENDOSCOPY;  Service: Gastroenterology;  Laterality: N/A;  . EYE SURGERY    . FEMORAL-POPLITEAL BYPASS GRAFT Left 12/16/2017   Procedure: BYPASS GRAFT FEMORAL-POPLITEAL ARTERY ( OPEN POPLITEAL BYPASS );  Surgeon: Algernon Huxley, MD;  Location: [facility] ORS;  Service: Vascular;  Laterality: Left;  . FLEXIBLE SIGMOIDOSCOPY N/A 08/09/2015   Procedure: FLEXIBLE SIGMOIDOSCOPY looking up the rectum into the distal colon with a lighted tube to examine and treat;  Surgeon: Lollie Sails, MD;  Location: [facility] ENDOSCOPY;  Service: Endoscopy;  Laterality: N/A;  Procedure to be done tomorrow afternoon, 08/08/2015. Awaiting cardiology consult.  . INSERT / REPLACE / REMOVE PACEMAKER    . JOINT REPLACEMENT Left    left knee replacement  . LOWER EXTREMITY ANGIOGRAPHY Left 11/16/2017   Procedure: LOWER EXTREMITY ANGIOGRAPHY;  Surgeon: Algernon Huxley, MD;  Location: [place] CV LAB;  Service: Cardiovascular;  Laterality: Left;  . LOWER EXTREMITY ANGIOGRAPHY Right 11/30/2017   Procedure: LOWER EXTREMITY ANGIOGRAPHY;  Surgeon: Algernon Huxley, MD;  Location: [place] CV LAB;  Service: Cardiovascular;  Laterality: Right;  . LOWER EXTREMITY ANGIOGRAPHY Left 03/04/2018   Procedure: LOWER EXTREMITY ANGIOGRAPHY;  Surgeon: Algernon Huxley,  MD;  Location: [place] CV LAB;  Service: Cardiovascular;  Laterality: Left;  . LOWER EXTREMITY ANGIOGRAPHY Left 04/12/2018   Procedure: LOWER EXTREMITY ANGIOGRAPHY;  Surgeon: Algernon Huxley, MD;  Location: [place] CV LAB;  Service: Cardiovascular;  Laterality: Left;  . PACEMAKER INSERTION    . PROSTATE SURGERY    . PROSTATE SURGERY     Patient Active Problem List   Diagnosis Date Noted  . Pubic ramus fracture ([place]) 10/03/2018  .  Iron deficiency anemia 04/09/2018  . Acute blood loss anemia   . Symptomatic anemia 04/02/2018  . 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 Aneurysm ([place]) 11/13/2017  . 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 Protein-calorie malnutrition, severe ([place]) 08/08/2015  . Abdominal pain, epigastric 08/06/2015  . Nausea and vomiting 08/06/2015  . HTN (hypertension) 08/06/2015  . HLD (hyperlipidemia) 08/06/2015  . Back pain 08/06/2015  . CKD (chronic kidney disease), stage III ([place]) 08/06/2015  . Abdominal pain 08/06/2015      Prior to Admission medications   Medication Sig Start Date End Date Taking? Authorizing Provider  benazepril (LOTENSIN) 20 MG tablet Take 1 tablet (20 mg total) by mouth daily. 04/06/18  Yes Gouru, Illene Silver, MD  furosemide (LASIX) 20 MG tablet Take 1 tablet by mouth daily. 04/23/17  Yes [provider]  metoprolol tartrate (LOPRESSOR) 25 MG tablet Take 1 tablet (25 mg total) by mouth 2 (two) times daily. 04/06/18  Yes Gouru, Illene Silver, MD  acetaminophen (TYLENOL) 325 MG tablet Take 2 tablets (650 mg total) by mouth every 6 (six) hours as needed for mild pain (or Fever >/= 101). 04/06/18   Gouru, Aruna, MD  albuterol (PROVENTIL HFA;VENTOLIN HFA) 108 (90 Base) MCG/ACT inhaler Inhale 2 puffs into the lungs every 6 (six) hours as needed for wheezing or shortness of breath. 04/23/17   Lavonia Drafts, MD  amLODipine (NORVASC) 2.5 MG tablet  08/31/18   [provider]  ANORO ELLIPTA 62.5-25 MCG/INH AEPB Inhale 1 puff into the lungs 2 (two) times daily. 04/13/17   [provider]  aspirin EC 81 MG EC tablet Take 1 tablet (81 mg total) by mouth daily. 12/22/17   Algernon Huxley, MD  docusate sodium (COLACE) 100 MG capsule Take 1 capsule (100 mg total) by mouth daily. 04/06/18   Gouru, Illene Silver, MD  feeding supplement, ENSURE ENLIVE, (ENSURE ENLIVE) LIQD Take 237 mLs by mouth 2 (two) times daily between meals. 04/07/18   Nicholes Mango, MD  ferrous sulfate 325 (65  FE) MG tablet Take 1 tablet (325 mg total) by mouth 2 (two) times daily with a meal. 04/06/18   Gouru, Aruna, MD  pantoprazole (PROTONIX) 40 MG tablet Take 1 tablet (40 mg total) by mouth daily. 04/06/18   Gouru, Illene Silver, MD  simvastatin (ZOCOR) 20 MG tablet Take 40 mg by mouth daily.     [provider]  traMADol (ULTRAM) 50 MG tablet Take 1 tablet (50 mg total) by mouth every 6 (six) hours as needed for moderate pain or severe pain. 04/06/18   Nicholes Mango, MD  traZODone (DESYREL) 50 MG tablet Take 1 tablet by mouth at bedtime as needed for sleep.  04/13/17   [provider]  vitamin B-12 1000 MCG tablet Take 1 tablet (1,000 mcg total) by mouth daily. 04/07/18   Nicholes Mango, MD    Allergies Patient has no known allergies.    Social History Social History   Tobacco Use  . Smoking status: Former Smoker  Last attempt to quit: 03/02/1958    Years since quitting: 60.6  . Smokeless tobacco: Former Systems developer    Types: Chew, Snuff  Substance Use Topics  . Alcohol use: No    Alcohol/week: 0.0 standard drinks  . Drug use: No    Review of Systems Patient denies headaches, rhinorrhea, blurry vision, numbness, shortness of breath, chest pain, edema, cough, abdominal pain, nausea, vomiting, diarrhea, dysuria, fevers, rashes or hallucinations unless otherwise stated above in HPI. ____________________________________________   PHYSICAL EXAM:  VITAL SIGNS: Vitals:   10/03/18 1252 10/03/18 1400  BP: (!) 176/75 136/76  Pulse: (!) 110 91  Resp: 16   Temp: 97.8 F (36.6 C)   SpO2: 97% 95%    Constitutional: Alert and oriented.  Eyes: Conjunctivae are normal.  Head: Atraumatic. Nose: No congestion/rhinnorhea. Mouth/Throat: Mucous membranes are moist.   Neck: No stridor. Painless ROM.  Cardiovascular: Normal rate, regular rhythm. Grossly normal heart sounds.  Good peripheral circulation. Respiratory: Normal respiratory effort.  No retractions. Lungs CTAB. Gastrointestinal:  Soft and nontender. No distention. No abdominal bruits. No CVA tenderness. Genitourinary:  Musculoskeletal:pain with palpation of right hip, no pelvic instability,   No joint effusions. Neurologic:  Normal speech and language. No gross focal neurologic deficits are appreciated. No facial droop Skin:  Skin is warm, dry and intact. No rash noted. Psychiatric: Mood and affect are normal. Speech and behavior are normal.  ____________________________________________   LABS (all labs ordered are listed, but only abnormal results are displayed)  Results for orders placed or performed during the hospital encounter of 10/03/18 (from the past 24 hour(s))  CBC with Differential/Platelet     Status: Abnormal   Collection Time: 10/03/18  1:46 PM  Result Value Ref Range   WBC 14.2 (H) 4.0 - 10.5 K/uL   RBC 3.19 (L) 4.22 - 5.81 MIL/uL   Hemoglobin 9.4 (L) 13.0 - 17.0 g/dL   HCT 29.4 (L) 39.0 - 52.0 %   MCV 92.2 80.0 - 100.0 fL   MCH 29.5 26.0 - 34.0 pg   MCHC 32.0 30.0 - 36.0 g/dL   RDW 15.8 (H) 11.5 - 15.5 %   Platelets 183 150 - 400 K/uL   nRBC 0.0 0.0 - 0.2 %   Neutrophils Relative % 91 %   Neutro Abs 12.8 (H) 1.7 - 7.7 K/uL   Lymphocytes Relative 3 %   Lymphs Abs 0.5 (L) 0.7 - 4.0 K/uL   Monocytes Relative 5 %   Monocytes Absolute 0.7 0.1 - 1.0 K/uL   Eosinophils Relative 0 %   Eosinophils Absolute 0.0 0.0 - 0.5 K/uL   Basophils Relative 0 %   Basophils Absolute 0.0 0.0 - 0.1 K/uL   Immature Granulocytes 1 %   Abs Immature Granulocytes 0.10 (H) 0.00 - 0.07 K/uL  Basic metabolic panel     Status: Abnormal   Collection Time: 10/03/18  1:46 PM  Result Value Ref Range   Sodium 135 135 - 145 mmol/L   Potassium 4.0 3.5 - 5.1 mmol/L   Chloride 103 98 - 111 mmol/L   CO2 24 22 - 32 mmol/L   Glucose, Bld 141 (H) 70 - 99 mg/dL   BUN 29 (H) 8 - 23 mg/dL   Creatinine, Ser 1.63 (H) 0.61 - 1.24 mg/dL   Calcium 9.6 8.9 - 10.3 mg/dL   GFR calc non Af Amer 35 (L) >60 mL/min   GFR calc Af Amer 40  (L) >60 mL/min   Anion gap 8 5 - 15  ____________________________________________  EKG My review and personal interpretation at Time: 15:55   Indication: fall  Rate: 80  Rhythm: a-s v paced Other: abnormal ekg ____________________________________________  RADIOLOGY  I personally reviewed all radiographic images ordered to evaluate for the above acute complaints and reviewed radiology reports and findings.  These findings were personally discussed with the patient.  Please see medical record for radiology report.  ____________________________________________   PROCEDURES  Procedure(s) performed:  Procedures    Critical Care performed: no ____________________________________________   INITIAL IMPRESSION / ASSESSMENT AND PLAN / ED COURSE  Pertinent labs & imaging results that were available during my care of the patient were reviewed by me and considered in my medical decision making (see chart for details).   DDX: fracture, dislocation, contusion, hematoma  Devin Washington is a 84 y.o. who presents to the ED with right hip and leg pain as described above.  No head injury.  No neck or thoracic injury.  Pain will be treated with IV pain medication.  Will order radiographs to evaluate for above differential.  Clinical Course as of Oct 03 1600  Sun Oct 03, 2018  1353 Resp: 16 [PR]  1425 Given patient's drop in hemoglobin with worsening pain and evidence of pelvic fracture will order CT imaging to evaluate for hematoma or other associated unstable pelvic fracture.   [PR]  1554 There is evidence of a superior and inferior pubic rami fractures.  No evidence of associated hip fracture.  There is note of possible hematoma or hemorrhage in the right thigh.  Thigh compartment is soft.  Likely muscle strain w/ hematoma.    [PR]    Clinical Course User Index [PR] Merlyn Lot, MD     As part of my medical decision making, I reviewed the following data within the [facility] notes reviewed and incorporated, Labs reviewed, notes from prior ED visits and [place] Controlled Substance Database   ____________________________________________   FINAL CLINICAL IMPRESSION(S) / ED DIAGNOSES  Final diagnoses:  Closed fracture of ramus of right pubis, initial encounter ([place])  Fall, initial encounter      NEW MEDICATIONS STARTED DURING THIS VISIT:  New Prescriptions   No medications on file     Note:  This document was prepared using Dragon voice recognition software and may include unintentional dictation errors.    Merlyn Lot, MD 10/03/18 [phone]

## 2018-10-04 DIAGNOSIS — W19XXXA Unspecified fall, initial encounter: Secondary | ICD-10-CM | POA: Diagnosis not present

## 2018-10-04 DIAGNOSIS — S32591A Other specified fracture of right pubis, initial encounter for closed fracture: Secondary | ICD-10-CM | POA: Diagnosis not present

## 2018-10-04 DIAGNOSIS — S32599A Other specified fracture of unspecified pubis, initial encounter for closed fracture: Secondary | ICD-10-CM | POA: Diagnosis not present

## 2018-10-04 DIAGNOSIS — I739 Peripheral vascular disease, unspecified: Secondary | ICD-10-CM | POA: Diagnosis not present

## 2018-10-04 DIAGNOSIS — M6289 Other specified disorders of muscle: Secondary | ICD-10-CM | POA: Diagnosis not present

## 2018-10-04 LAB — CBC
HCT: 25.8 % — ABNORMAL LOW (ref 39.0–52.0)
Hemoglobin: 8.3 g/dL — ABNORMAL LOW (ref 13.0–17.0)
MCH: 29.5 pg (ref 26.0–34.0)
MCHC: 32.2 g/dL (ref 30.0–36.0)
MCV: 91.8 fL (ref 80.0–100.0)
NRBC: 0 % (ref 0.0–0.2)
Platelets: 175 10*3/uL (ref 150–400)
RBC: 2.81 MIL/uL — AB (ref 4.22–5.81)
RDW: 15.9 % — ABNORMAL HIGH (ref 11.5–15.5)
WBC: 9.6 10*3/uL (ref 4.0–10.5)

## 2018-10-04 LAB — BASIC METABOLIC PANEL
ANION GAP: 7 (ref 5–15)
BUN: 29 mg/dL — ABNORMAL HIGH (ref 8–23)
CO2: 27 mmol/L (ref 22–32)
Calcium: 8.7 mg/dL — ABNORMAL LOW (ref 8.9–10.3)
Chloride: 103 mmol/L (ref 98–111)
Creatinine, Ser: 1.7 mg/dL — ABNORMAL HIGH (ref 0.61–1.24)
GFR calc Af Amer: 38 mL/min — ABNORMAL LOW (ref >60)
GFR, EST NON AFRICAN AMERICAN: 33 mL/min — AB (ref >60)
GLUCOSE: 109 mg/dL — AB (ref 70–99)
POTASSIUM: 4.2 mmol/L (ref 3.5–5.1)
Sodium: 137 mmol/L (ref 135–145)

## 2018-10-04 MED ORDER — ENOXAPARIN SODIUM 40 MG/0.4ML ~~LOC~~ SOLN: 40.0000 mg | SUBCUTANEOUS | Status: DC

## 2018-10-04 MED ORDER — UMECLIDINIUM-VILANTEROL 62.5-25 MCG/INH IN AEPB
1.0000 | INHALATION_SPRAY | Freq: Every day | RESPIRATORY_TRACT | Status: DC
  Administered 2018-10-05 – 2018-10-07 (×3): 1 via RESPIRATORY_TRACT
  Filled 2018-10-04: qty 14

## 2018-10-04 NOTE — NC FL2 (Signed)
[place] LEVEL OF CARE SCREENING TOOL     IDENTIFICATION  Patient Name: Devin Washington Birthdate: June 17, 1923 Sex: male Admission Date (Current Location): 10/03/2018  [place] and [place] Number:  Engineering geologist and Address:  [facility], [address] [place], [place] [address]      Provider Number: [id number]  Attending Physician Name and Address:  Gladstone Lighter, MD  Relative Name and Phone Number:       Current Level of Care: Hospital Recommended Level of Care: [place] Prior Approval Number:    Date Approved/Denied:   PASRR Number: [id number])  Discharge Plan: SNF    Current Diagnoses: Patient Active Problem List   Diagnosis Date Noted  . Pubic ramus fracture ([place]) 10/03/2018  . Iron deficiency anemia 04/09/2018  . Acute blood loss anemia   . Symptomatic anemia 04/02/2018  . 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 Aneurysm ([place]) 11/13/2017  . 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 Protein-calorie malnutrition, severe ([place]) 08/08/2015  . Abdominal pain, epigastric 08/06/2015  . Nausea and vomiting 08/06/2015  . HTN (hypertension) 08/06/2015  . HLD (hyperlipidemia) 08/06/2015  . Back pain 08/06/2015  . CKD (chronic kidney disease), stage III ([place]) 08/06/2015  . Abdominal pain 08/06/2015    Orientation RESPIRATION BLADDER Height & Weight     Self, Time, Situation, Place  Normal Continent Weight: 169 lb (76.7 kg) Height:  5\' 10"  (177.8 cm)  BEHAVIORAL SYMPTOMS/MOOD NEUROLOGICAL BOWEL NUTRITION STATUS      Continent Diet(Diet: Heart Healthy )  AMBULATORY STATUS COMMUNICATION OF NEEDS Skin   Extensive Assist Verbally Normal                       Personal Care Assistance Level of Assistance  Bathing, Feeding, Dressing Bathing Assistance: Limited assistance Feeding assistance: Independent Dressing Assistance: Limited assistance     Functional Limitations Info   Sight, Hearing, Speech Sight Info: Adequate Hearing Info: Adequate Speech Info: Adequate    SPECIAL CARE FACTORS FREQUENCY  PT (By licensed PT), OT (By licensed OT)     PT Frequency: (5) OT Frequency: (5)            Contractures      Additional Factors Info  Code Status, Allergies Code Status Info: (DNR ) Allergies Info: (No Known Allergies. )           Current Medications (10/04/2018):  This is the current hospital active medication list Current Facility-Administered Medications  Medication Dose Route Frequency Provider Last Rate Last Dose  . 0.9 %  sodium chloride infusion  250 mL Intravenous PRN Pyreddy, Reatha Harps, MD      . acetaminophen (TYLENOL) tablet 650 mg  650 mg Oral Q6H PRN Pyreddy, Reatha Harps, MD       Or  . acetaminophen (TYLENOL) suppository 650 mg  650 mg Rectal Q6H PRN Pyreddy, Pavan, MD      . albuterol (PROVENTIL) (2.5 MG/3ML) 0.083% nebulizer solution 3 mL  3 mL Inhalation Q6H PRN Pyreddy, Pavan, MD      . amLODipine (NORVASC) tablet 2.5 mg  2.5 mg Oral Daily Pyreddy, Pavan, MD   2.5 mg at 10/04/18 1130  . benazepril (LOTENSIN) tablet 20 mg  20 mg Oral Daily Pyreddy, Pavan, MD   20 mg at 10/04/18 1131  . docusate sodium (COLACE) capsule 100 mg  100 mg Oral Daily Pyreddy, Reatha Harps, MD   100 mg at 10/04/18 1129  . feeding supplement (ENSURE  ENLIVE) (ENSURE ENLIVE) liquid 237 mL  237 mL Oral BID BM Pyreddy, Pavan, MD      . fentaNYL (SUBLIMAZE) injection 25 mcg  25 mcg Intravenous Q1H PRN Merlyn Lot, MD   25 mcg at 10/03/18 1349  . ferrous sulfate tablet 325 mg  325 mg Oral BID WC Pyreddy, Reatha Harps, MD   325 mg at 10/04/18 1131  . Influenza vac split quadrivalent PF (FLUZONE HIGH-DOSE) injection 0.5 mL  0.5 mL Intramuscular Tomorrow-1000 Pyreddy, Pavan, MD      . metoprolol tartrate (LOPRESSOR) tablet 25 mg  25 mg Oral BID Saundra Shelling, MD   25 mg at 10/04/18 1130  . morphine 2 MG/ML injection 2 mg  2 mg Intravenous Q4H PRN Pyreddy, Reatha Harps, MD      .  ondansetron (ZOFRAN) tablet 4 mg  4 mg Oral Q6H PRN Pyreddy, Reatha Harps, MD       Or  . ondansetron (ZOFRAN) injection 4 mg  4 mg Intravenous Q6H PRN Pyreddy, Pavan, MD      . pantoprazole (PROTONIX) EC tablet 40 mg  40 mg Oral Daily Pyreddy, Reatha Harps, MD   40 mg at 10/04/18 1130  . senna-docusate (Senokot-S) tablet 1 tablet  1 tablet Oral QHS PRN Saundra Shelling, MD      . simvastatin (ZOCOR) tablet 40 mg  40 mg Oral Daily Pyreddy, Reatha Harps, MD   40 mg at 10/03/18 1823  . sodium chloride flush (NS) 0.9 % injection 3 mL  3 mL Intravenous Q12H Pyreddy, Pavan, MD      . sodium chloride flush (NS) 0.9 % injection 3 mL  3 mL Intravenous PRN Pyreddy, Pavan, MD      . traMADol (ULTRAM) tablet 50 mg  50 mg Oral Q6H PRN Saundra Shelling, MD   50 mg at 10/03/18 2155  . traZODone (DESYREL) tablet 50 mg  50 mg Oral QHS PRN Pyreddy, Reatha Harps, MD      . umeclidinium-vilanterol (ANORO ELLIPTA) 62.5-25 MCG/INH 1 puff  1 puff Inhalation BID Saundra Shelling, MD   1 puff at 10/04/18 1134  . vitamin B-12 (CYANOCOBALAMIN) tablet 1,000 mcg  1,000 mcg Oral Daily Pyreddy, Reatha Harps, MD       Facility-Administered Medications Ordered in Other Encounters  Medication Dose Route Frequency Provider Last Rate Last Dose  . Darbepoetin Alfa (ARANESP) injection 100 mcg  100 mcg Subcutaneous Q28 days Sindy Guadeloupe, MD   100 mcg at 04/16/18 1505     Discharge Medications: Please see discharge summary for a list of discharge medications.  Relevant Imaging Results:  Relevant Lab Results:   Additional Information (SSN: [id number])  Rachelle Edwards, Veronia Beets, LCSW

## 2018-10-04 NOTE — Consult Note (Signed)
ORTHOPAEDIC CONSULTATION  REQUESTING PHYSICIAN: Gladstone Lighter, MD  Chief Complaint: right hip pain  HPI: Devin Washington is a 84 y.o. male who complains of right hip pain after fall. Please see H&P and ED notes for details. Denies any numbness, tingling or constitutional symptoms.  Past Medical History:  Diagnosis Date  . A-fib ([place])   . Cardiomyopathy ([place])   . CHF (congestive heart failure) ([place])   . DJD (degenerative joint disease)   . Duodenal ulcer 08/09/2015  . Dysrhythmia   . Erosive esophagitis   . HH (hiatus hernia)   . HLD (hyperlipidemia)   . Hypertension   . Pacemaker   . PAD (peripheral artery disease) ([place])   . Peripheral vascular disease ([place])   . Presence of permanent cardiac pacemaker   . Prostate cancer [facility])    Prostatectomy.  . 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 Shortness of breath   . SSS (sick sinus syndrome) [facility])    Past Surgical History:  Procedure Laterality Date  . CATARACT EXTRACTION, BILATERAL    . COLONOSCOPY N/A 04/04/2018   Procedure: COLONOSCOPY;  Surgeon: Lin Landsman, MD;  Location: [facility] ENDOSCOPY;  Service: Gastroenterology;  Laterality: N/A;  . ESOPHAGOGASTRODUODENOSCOPY N/A 08/09/2015   Procedure: ESOPHAGOGASTRODUODENOSCOPY (EGD) looking in the stomach with a lighted tube to evaluate and treat;  Surgeon: Lollie Sails, MD;  Location: [facility] ENDOSCOPY;  Service: Endoscopy;  Laterality: N/A;  Procedure to be done tomorrow afternoon, 08/08/2015. Awaiting cardiology consult  . ESOPHAGOGASTRODUODENOSCOPY N/A 04/04/2018   Procedure: ESOPHAGOGASTRODUODENOSCOPY (EGD);  Surgeon: Lin Landsman, MD;  Location: [facility] ENDOSCOPY;  Service: Gastroenterology;  Laterality: N/A;  . EYE SURGERY    . FEMORAL-POPLITEAL BYPASS GRAFT Left 12/16/2017   Procedure: BYPASS GRAFT FEMORAL-POPLITEAL ARTERY ( OPEN POPLITEAL BYPASS );  Surgeon: Algernon Huxley, MD;  Location: [facility] ORS;  Service: Vascular;  Laterality: Left;  . FLEXIBLE SIGMOIDOSCOPY N/A  08/09/2015   Procedure: FLEXIBLE SIGMOIDOSCOPY looking up the rectum into the distal colon with a lighted tube to examine and treat;  Surgeon: Lollie Sails, MD;  Location: [facility] ENDOSCOPY;  Service: Endoscopy;  Laterality: N/A;  Procedure to be done tomorrow afternoon, 08/08/2015. Awaiting cardiology consult.  . INSERT / REPLACE / REMOVE PACEMAKER    . JOINT REPLACEMENT Left    left knee replacement  . LOWER EXTREMITY ANGIOGRAPHY Left 11/16/2017   Procedure: LOWER EXTREMITY ANGIOGRAPHY;  Surgeon: Algernon Huxley, MD;  Location: [place] CV LAB;  Service: Cardiovascular;  Laterality: Left;  . LOWER EXTREMITY ANGIOGRAPHY Right 11/30/2017   Procedure: LOWER EXTREMITY ANGIOGRAPHY;  Surgeon: Algernon Huxley, MD;  Location: [place] CV LAB;  Service: Cardiovascular;  Laterality: Right;  . LOWER EXTREMITY ANGIOGRAPHY Left 03/04/2018   Procedure: LOWER EXTREMITY ANGIOGRAPHY;  Surgeon: Algernon Huxley, MD;  Location: [place] CV LAB;  Service: Cardiovascular;  Laterality: Left;  . LOWER EXTREMITY ANGIOGRAPHY Left 04/12/2018   Procedure: LOWER EXTREMITY ANGIOGRAPHY;  Surgeon: Algernon Huxley, MD;  Location: [place] CV LAB;  Service: Cardiovascular;  Laterality: Left;  . PACEMAKER INSERTION    . PROSTATE SURGERY    . PROSTATE SURGERY     Social History   Socioeconomic History  . Marital status: Widowed    Spouse name: Not on file  . Number of children: Not on file  . Years of education: Not on file  . Highest education level: Not on file  Occupational History    Employer: [employer]  Social Needs  . Financial  resource strain: Not on file  . Food insecurity:    Worry: Not on file    Inability: Not on file  . Transportation needs:    Medical: Not on file    Non-medical: Not on file  Tobacco Use  . Smoking status: Former Smoker    Last attempt to quit: 03/02/1958    Years since quitting: 60.6  . Smokeless tobacco: Former Systems developer    Types: Chew, Snuff  Substance and  Sexual Activity  . Alcohol use: No    Alcohol/week: 0.0 standard drinks  . Drug use: No  . Sexual activity: Not Currently  Lifestyle  . Physical activity:    Days per week: Not on file    Minutes per session: Not on file  . Stress: Not on file  Relationships  . Social connections:    Talks on phone: Not on file    Gets together: Not on file    Attends religious service: Not on file    Active member of club or organization: Not on file    Attends meetings of clubs or organizations: Not on file    Relationship status: Not on file  Other Topics Concern  . Not on file  Social History Narrative  . Not on file   Family History  Problem Relation Age of Onset  . Lung cancer Father    No Known Allergies Prior to Admission medications   Medication Sig Start Date End Date Taking? Authorizing Provider  acetaminophen (TYLENOL) 325 MG tablet Take 2 tablets (650 mg total) by mouth every 6 (six) hours as needed for mild pain (or Fever >/= 101). 04/06/18  Yes Gouru, Aruna, MD  amLODipine (NORVASC) 2.5 MG tablet Take 2.5 mg by mouth daily.  08/31/18  Yes [provider]  ANORO ELLIPTA 62.5-25 MCG/INH AEPB Inhale 1 puff into the lungs 2 (two) times daily. 04/13/17  Yes [provider]  aspirin EC 81 MG EC tablet Take 1 tablet (81 mg total) by mouth daily. 12/22/17  Yes Dew, Erskine Squibb, MD  benazepril (LOTENSIN) 20 MG tablet Take 1 tablet (20 mg total) by mouth daily. 04/06/18  Yes Gouru, Illene Silver, MD  docusate sodium (COLACE) 100 MG capsule Take 1 capsule (100 mg total) by mouth daily. 04/06/18  Yes Gouru, Illene Silver, MD  feeding supplement, ENSURE ENLIVE, (ENSURE ENLIVE) LIQD Take 237 mLs by mouth 2 (two) times daily between meals. 04/07/18  Yes Gouru, Illene Silver, MD  ferrous sulfate 325 (65 FE) MG tablet Take 1 tablet (325 mg total) by mouth 2 (two) times daily with a meal. 04/06/18  Yes Gouru, Aruna, MD  furosemide (LASIX) 20 MG tablet Take 1 tablet by mouth daily. 04/23/17  Yes [provider]  metoprolol tartrate (LOPRESSOR) 25 MG tablet Take 1 tablet (25 mg total) by mouth 2 (two) times daily. 04/06/18  Yes Gouru, Illene Silver, MD  pantoprazole (PROTONIX) 40 MG tablet Take 1 tablet (40 mg total) by mouth daily. 04/06/18  Yes Gouru, Illene Silver, MD  simvastatin (ZOCOR) 20 MG tablet Take 20 mg by mouth daily.    Yes [provider]  traMADol (ULTRAM) 50 MG tablet Take 1 tablet (50 mg total) by mouth every 6 (six) hours as needed for moderate pain or severe pain. Patient taking differently: Take 50 mg by mouth 3 (three) times daily as needed for moderate pain or severe pain.  04/06/18  Yes Gouru, Illene Silver, MD  traZODone (DESYREL) 50 MG tablet Take 1 tablet by mouth at bedtime as needed  for sleep.  04/13/17  Yes [provider]  vitamin B-12 1000 MCG tablet Take 1 tablet (1,000 mcg total) by mouth daily. 04/07/18  Yes Gouru, Illene Silver, MD  albuterol (PROVENTIL HFA;VENTOLIN HFA) 108 (90 Base) MCG/ACT inhaler Inhale 2 puffs into the lungs every 6 (six) hours as needed for wheezing or shortness of breath. 04/23/17   Lavonia Drafts, MD   Dg Lumbar Spine 2-3 Views  Result Date: 10/03/2018 CLINICAL DATA:  Low back pain since a fall this morning. Initial encounter. EXAM: LUMBAR SPINE - 2-3 VIEW COMPARISON:  CT abdomen and pelvis 07/14/2016. FINDINGS: There is no acute abnormality. The patient has advanced multilevel degenerative disc disease and facet arthropathy. No traumatic listhesis. Aortic atherosclerosis noted. IMPRESSION: No acute finding.  Advanced multilevel degenerative disease. Electronically Signed   By: Inge Rise M.D.   On: 10/03/2018 13:56   Ct Pelvis W Contrast  Result Date: 10/03/2018 CLINICAL DATA:  Pelvis trauma with fracture suspected. Fall with right-sided pain. EXAM: CT PELVIS WITH CONTRAST TECHNIQUE: Multidetector CT imaging of the pelvis was performed using the standard protocol following the bolus administration of intravenous contrast. CONTRAST:  68mL ISOVUE-300  IOPAMIDOL (ISOVUE-300) INJECTION 61% COMPARISON:  07/14/2016 abdominal CT FINDINGS: Urinary Tract:  Negative bladder Bowel:  No acute finding. Vascular/Lymphatic: Extensive atherosclerotic calcification. Dilated right common iliac artery is with fusiform aneurysm measuring 2.5 cm. There is also lower abdominal aortic dilatation measuring 3.1 cm. Reproductive:  Prostatectomy Other:  Negative Musculoskeletal: Nondisplaced right superior pubic and inferior pubic ramus fracture with avulsed fragment at the abductor compartment. No detectable sacral ala fracture. No sacroiliac or symphysis pubis diastasis. Both hips are located. There is soft tissue stranding about the fractures. The muscles of the right abductor compartment are expanded and there is a wispy high-density that is partially covered within abductor musculature, cannot exclude injured vessel or focus of active bleeding. IMPRESSION: 1. Nondisplaced right inferior and superior pubic ramus fractures. 2. Strain of right abductor compartment with possible focus of active intramuscular hemorrhage (wispy high-density seen on the lowest slice is incompletely covered). Please observe for progressive swelling in the right thigh. 3. 3.1 cm abdominal aortic aneurysm with mild increase from 2017 (when it was 2.8 cm). 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 Electronically Signed   By: Monte Fantasia M.D.   On: 10/03/2018 15:48   Dg Hip Unilat  With Pelvis 2-3 Views Right  Result Date: 10/03/2018 CLINICAL DATA:  Right hip pain post fall. EXAM: DG HIP (WITH OR WITHOUT PELVIS) 2-3V RIGHT COMPARISON:  None. FINDINGS: There is no evidence of right hip fracture or dislocation. Osteopenia. Age-indeterminate fracture of the right superior pubic ramus. Questionable healed fracture of the inferior right pubic ramus. The left pubic bones are incompletely evaluated. Internal rotation of the left hip, which is also incompletely evaluated.  IMPRESSION: No evidence of right hip fracture. Age-indeterminate fracture of the right superior pubic ramus. Questionable healed fracture of the inferior right pubic ramus. Incomplete visualized left pelvic bones, and left hip, which is internally rotated. Please correlate to possible point of tenderness. Electronically Signed   By: Fidela Salisbury M.D.   On: 10/03/2018 14:02   Dg Femur Min 2 Views Right  Result Date: 10/03/2018 CLINICAL DATA:  Right hip pain after fall. EXAM: RIGHT FEMUR 2 VIEWS COMPARISON:  None. FINDINGS: There is no evidence of fracture or other focal bone lesions. Soft tissues are unremarkable. IMPRESSION: Negative. Electronically Signed   By: Sabino Dick  Brooke Bonito, M.D.   On: 10/03/2018 14:00    Positive ROS: All other systems have been reviewed and were otherwise negative with the exception of those mentioned in the HPI and as above.  Physical Exam: General: Alert, no acute distress Cardiovascular: No pedal edema Respiratory: No cyanosis, no use of accessory musculature GI: No organomegaly, abdomen is soft and non-tender Skin: No lesions in the area of chief complaint Neurologic: Sensation intact distally Psychiatric: Patient is competent for consent with normal mood and affect Lymphatic: No axillary or cervical lymphadenopathy  MUSCULOSKELETAL: pain with IR/ER of the right hip. Compartments soft. Good cap refill. Motor and sensory intact distally.  Assessment: Right sided pubic rami fractures  Plan: Patient may be WBAT with physical therapy. He should follow-up with me in 2 to 3 weeks for repeat check and x-ray. Please call with questions.    Lovell Sheehan, MD    10/04/2018 10:50 AM

## 2018-10-04 NOTE — Clinical Social Work Placement (Signed)
   CLINICAL SOCIAL WORK PLACEMENT  NOTE  Date:  10/04/2018  Patient Details  Name: Devin Washington MRN: [id number] Date of Birth: 1923/04/01  Clinical Social Work is seeking post-discharge placement for this patient at the [facility] level of care (*CSW will initial, date and re-position this form in  chart as items are completed):  Yes   Patient/family provided with [facility] Work Department's list of facilities offering this level of care within the geographic area requested by the patient (or if unable, by the patient's family).  Yes   Patient/family informed of their freedom to choose among providers that offer the needed level of care, that participate in Medicare, Medicaid or managed care program needed by the patient, have an available bed and are willing to accept the patient.  Yes   Patient/family informed of [facility]'s ownership interest in [facility] and [facility], as well as of the fact that they are under no obligation to receive care at these facilities.  PASRR submitted to EDS on 10/04/18     PASRR number received on 10/04/18     Existing PASRR number confirmed on       FL2 transmitted to all facilities in geographic area requested by pt/family on 10/04/18     FL2 transmitted to all facilities within larger geographic area on       Patient informed that his/her managed care company has contracts with or will negotiate with certain facilities, including the following:            Patient/family informed of bed offers received.  Patient chooses bed at       Physician recommends and patient chooses bed at      Patient to be transferred to   on  .  Patient to be transferred to facility by       Patient family notified on   of transfer.  Name of family member notified:        PHYSICIAN       Additional Comment:    _______________________________________________ Alysse Rathe, Veronia Beets, LCSW 10/04/2018, 5:46 PM

## 2018-10-04 NOTE — Clinical Social Work Note (Signed)
Clinical Social Work Assessment  Patient Details  Name: Devin Washington MRN: [id number] Date of Birth: May 05, 1923  Date of referral:  10/04/18               Reason for consult:  Facility Placement                Permission sought to share information with:  Chartered certified accountant granted to share information::  Yes, Verbal Permission Granted  Name::      Devin Washington::   [place]   Relationship::     Contact Information:     Housing/Transportation Living arrangements for the past 2 months:  [place] of Information:  Patient Patient Interpreter Needed:  None Criminal Activity/Legal Involvement Pertinent to Current Situation/Hospitalization:  No - Comment as needed Significant Relationships:  Significant Other Lives with:  Self Do you feel safe going back to the place where you live?  Yes Need for family participation in patient care:  Yes (Comment)  Care giving concerns:  Patient lives in [place] alone.    Social Worker assessment / plan:  Holiday representative (CSW) reviewed chart and noted that patient has a pelvic fracture. PT is pending. CSW met with patient alone at bedside to discuss D/C plan. Patient was alert and oriented X4 and was laying in the bed. CSW introduced self and explained role of CSW department. Per patient he lives alone in [place] and his girlfriend Devin Washington is his primary support. CSW explained that PT will evaluate him and make a recommendation of home health or SNF. Patient is open to SNF if needed. CSW explained that [facility] will have to approve SNF. Patient verbalized his understanding. FL2 complete and faxed out. CSW will continue to follow and assist as needed.   Employment status:  Retired Nurse, adult PT Recommendations:  Not assessed at this time Information / Referral to community resources:  [facility](SNF vs. [facility] )  Patient/Family's  Response to care:  Patient is open to SNF if needed.   Patient/Family's Understanding of and Emotional Response to Diagnosis, Current Treatment, and Prognosis:  Patient was very pleasant and thanked CSW for assistance.   Emotional Assessment Appearance:  Appears stated age Attitude/Demeanor/Rapport:    Affect (typically observed):  Accepting, Adaptable, Pleasant Orientation:  Oriented to Self, Oriented to Place, Oriented to  Time, Oriented to Situation Alcohol / Substance use:  Not Applicable Psych involvement (Current and /or in the community):  No (Comment)  Discharge Needs  Concerns to be addressed:  Discharge Planning Concerns Readmission within the last 30 days:  No Current discharge risk:  Dependent with Mobility Barriers to Discharge:  Continued Medical Work up   [employer], Veronia Beets, LCSW 10/04/2018, 5:43 PM

## 2018-10-04 NOTE — Progress Notes (Addendum)
[place] [facility] [place] NAME: Devin Washington    MR#:  [id number]  DATE OF BIRTH:  [birth year]  SUBJECTIVE:  CHIEF COMPLAINT:   Chief Complaint  Patient presents with  . Fall   -Came in after a fall and noted to have right pubic rami fracture.  Complains of significant pain on moving.  Awaiting physical therapy consult  REVIEW OF SYSTEMS:  Review of Systems  Constitutional: Negative for chills and fever.  HENT: Positive for hearing loss. Negative for congestion, ear discharge and nosebleeds.   Eyes: Negative for blurred vision and double vision.  Respiratory: Negative for cough, shortness of breath and wheezing.   Cardiovascular: Negative for chest pain and palpitations.  Gastrointestinal: Negative for abdominal pain, constipation, diarrhea, nausea and vomiting.  Genitourinary: Negative for dysuria.  Musculoskeletal: Positive for joint pain and myalgias.  Neurological: Negative for dizziness, focal weakness, seizures, weakness and headaches.  Psychiatric/Behavioral: Negative for depression.    DRUG ALLERGIES:  No Known Allergies  VITALS:  Blood pressure (!) 161/86, pulse 70, temperature 98.6 F (37 C), temperature source Oral, resp. rate 16, height 5\' 10"  (1.778 m), weight 76.7 kg, SpO2 100 %.  PHYSICAL EXAMINATION:  Physical Exam  GENERAL:  84 y.o.-year-old elderly patient lying in the bed with no acute distress.  EYES: Pupils equal, round, reactive to light and accommodation. No scleral icterus. Extraocular muscles intact.  HEENT: Head atraumatic, normocephalic. Oropharynx and nasopharynx clear.  NECK:  Supple, no jugular venous distention. No thyroid enlargement, no tenderness.  LUNGS: Normal breath sounds bilaterally, no wheezing, rales,rhonchi or crepitation. No use of accessory muscles of respiration. Decreased bibasilar breath sounds CARDIOVASCULAR: S1, S2 normal. No  rubs, or gallops. 2/6 systolic murmur present ABDOMEN:  Soft, nontender, nondistended. Bowel sounds present. No organomegaly or mass.  EXTREMITIES: right leg guarded movement due to pain.  No pedal edema, cyanosis, or clubbing.  NEUROLOGIC: Cranial nerves II through XII are intact. Muscle strength 5/5 in all extremities. Sensation intact. Gait not checked.  PSYCHIATRIC: The patient is alert and oriented x 3.  SKIN: No obvious rash, lesion, or ulcer.    LABORATORY PANEL:   CBC Recent Labs  Lab 10/04/18 0427  WBC 9.6  HGB 8.3*  HCT 25.8*  PLT 175   ------------------------------------------------------------------------------------------------------------------  Chemistries  Recent Labs  Lab 10/04/18 0427  NA 137  K 4.2  CL 103  CO2 27  GLUCOSE 109*  BUN 29*  CREATININE 1.70*  CALCIUM 8.7*   ------------------------------------------------------------------------------------------------------------------  Cardiac Enzymes No results for input(s): TROPONINI in the last 168 hours. ------------------------------------------------------------------------------------------------------------------  RADIOLOGY:  Dg Lumbar Spine 2-3 Views  Result Date: 10/03/2018 CLINICAL DATA:  Low back pain since a fall this morning. Initial encounter. EXAM: LUMBAR SPINE - 2-3 VIEW COMPARISON:  CT abdomen and pelvis 07/14/2016. FINDINGS: There is no acute abnormality. The patient has advanced multilevel degenerative disc disease and facet arthropathy. No traumatic listhesis. Aortic atherosclerosis noted. IMPRESSION: No acute finding.  Advanced multilevel degenerative disease. Electronically Signed   By: Inge Rise M.D.   On: 10/03/2018 13:56   Ct Pelvis W Contrast  Result Date: 10/03/2018 CLINICAL DATA:  Pelvis trauma with fracture suspected. Fall with right-sided pain. EXAM: CT PELVIS WITH CONTRAST TECHNIQUE: Multidetector CT imaging of the pelvis was performed using the standard protocol following the bolus administration of intravenous  contrast. CONTRAST:  22mL ISOVUE-300 IOPAMIDOL (ISOVUE-300) INJECTION 61% COMPARISON:  07/14/2016 abdominal CT FINDINGS: Urinary Tract:  Negative bladder Bowel:  No  acute finding. Vascular/Lymphatic: Extensive atherosclerotic calcification. Dilated right common iliac artery is with fusiform aneurysm measuring 2.5 cm. There is also lower abdominal aortic dilatation measuring 3.1 cm. Reproductive:  Prostatectomy Other:  Negative Musculoskeletal: Nondisplaced right superior pubic and inferior pubic ramus fracture with avulsed fragment at the abductor compartment. No detectable sacral ala fracture. No sacroiliac or symphysis pubis diastasis. Both hips are located. There is soft tissue stranding about the fractures. The muscles of the right abductor compartment are expanded and there is a wispy high-density that is partially covered within abductor musculature, cannot exclude injured vessel or focus of active bleeding. IMPRESSION: 1. Nondisplaced right inferior and superior pubic ramus fractures. 2. Strain of right abductor compartment with possible focus of active intramuscular hemorrhage (wispy high-density seen on the lowest slice is incompletely covered). Please observe for progressive swelling in the right thigh. 3. 3.1 cm abdominal aortic aneurysm with mild increase from 2017 (when it was 2.8 cm). 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 Electronically Signed   By: Monte Fantasia M.D.   On: 10/03/2018 15:48   Dg Hip Unilat  With Pelvis 2-3 Views Right  Result Date: 10/03/2018 CLINICAL DATA:  Right hip pain post fall. EXAM: DG HIP (WITH OR WITHOUT PELVIS) 2-3V RIGHT COMPARISON:  None. FINDINGS: There is no evidence of right hip fracture or dislocation. Osteopenia. Age-indeterminate fracture of the right superior pubic ramus. Questionable healed fracture of the inferior right pubic ramus. The left pubic bones are incompletely evaluated. Internal rotation of the left hip, which is  also incompletely evaluated. IMPRESSION: No evidence of right hip fracture. Age-indeterminate fracture of the right superior pubic ramus. Questionable healed fracture of the inferior right pubic ramus. Incomplete visualized left pelvic bones, and left hip, which is internally rotated. Please correlate to possible point of tenderness. Electronically Signed   By: Fidela Salisbury M.D.   On: 10/03/2018 14:02   Dg Femur Min 2 Views Right  Result Date: 10/03/2018 CLINICAL DATA:  Right hip pain after fall. EXAM: RIGHT FEMUR 2 VIEWS COMPARISON:  None. FINDINGS: There is no evidence of fracture or other focal bone lesions. Soft tissues are unremarkable. IMPRESSION: Negative. Electronically Signed   By: Marijo Conception, M.D.   On: 10/03/2018 14:00    EKG:   Orders placed or performed during the hospital encounter of 10/03/18  . EKG 12-Lead  . EKG 12-Lead    ASSESSMENT AND PLAN:   85 year old male with past medical history significant for cardiomyopathy, congestive heart failure, hiatal hernia, hypertension, PAD, pacemaker placement presents to hospital after a fall.  1.  Right pubic ramus fracture-secondary to mechanical fall -Appreciate orthopedics consult.  Weightbearing as tolerated -Pain control and physical therapy -Also CT of the pelvis showing strain of right abductor compartment with active intramuscular hemorrhage.  Continue to monitor closely. -We will hold off on heparin products for now.  2.  CKD stage III-baseline creatinine around 1.6, stable at this time.  3.  Hypertension-on benazepril, Norvasc and metoprolol  4.  Cardiomyopathy-stable, euvolemic in appearance.  5.  GERD-PPI  6.  DVT prophylaxis-teds and SCDs  Physical Therapy consulted    All the records are reviewed and case discussed with Care Management/Social Workerr. Management plans discussed with the patient, family and they are in agreement.  CODE STATUS: Full Code  TOTAL TIME TAKING CARE OF THIS  PATIENT: 38 minutes.   POSSIBLE D/C IN 2 DAYS, DEPENDING ON CLINICAL CONDITION.   Gladstone Lighter M.D on 10/04/2018  at 2:43 PM  Between 7am to 6pm - Pager - [phone]  After 6pm go to [email or web address] - password [email or web address] [facility]  Office  [phone]  CC: Primary care physician; Albina Billet, MD

## 2018-10-05 DIAGNOSIS — W19XXXA Unspecified fall, initial encounter: Secondary | ICD-10-CM | POA: Diagnosis not present

## 2018-10-05 DIAGNOSIS — I739 Peripheral vascular disease, unspecified: Secondary | ICD-10-CM | POA: Diagnosis not present

## 2018-10-05 DIAGNOSIS — M6289 Other specified disorders of muscle: Secondary | ICD-10-CM | POA: Diagnosis not present

## 2018-10-05 DIAGNOSIS — S32599A Other specified fracture of unspecified pubis, initial encounter for closed fracture: Secondary | ICD-10-CM | POA: Diagnosis not present

## 2018-10-05 LAB — CBC
HEMATOCRIT: 26.2 % — AB (ref 39.0–52.0)
Hemoglobin: 8.4 g/dL — ABNORMAL LOW (ref 13.0–17.0)
MCH: 29.3 pg (ref 26.0–34.0)
MCHC: 32.1 g/dL (ref 30.0–36.0)
MCV: 91.3 fL (ref 80.0–100.0)
Platelets: 177 10*3/uL (ref 150–400)
RBC: 2.87 MIL/uL — AB (ref 4.22–5.81)
RDW: 15.7 % — ABNORMAL HIGH (ref 11.5–15.5)
WBC: 11.5 10*3/uL — ABNORMAL HIGH (ref 4.0–10.5)
nRBC: 0 % (ref 0.0–0.2)

## 2018-10-05 LAB — BASIC METABOLIC PANEL
ANION GAP: 4 — AB (ref 5–15)
BUN: 29 mg/dL — ABNORMAL HIGH (ref 8–23)
CO2: 28 mmol/L (ref 22–32)
Calcium: 8.7 mg/dL — ABNORMAL LOW (ref 8.9–10.3)
Chloride: 103 mmol/L (ref 98–111)
Creatinine, Ser: 1.58 mg/dL — ABNORMAL HIGH (ref 0.61–1.24)
GFR calc Af Amer: 42 mL/min — ABNORMAL LOW (ref >60)
GFR, EST NON AFRICAN AMERICAN: 36 mL/min — AB (ref >60)
GLUCOSE: 119 mg/dL — AB (ref 70–99)
POTASSIUM: 4 mmol/L (ref 3.5–5.1)
Sodium: 135 mmol/L (ref 135–145)

## 2018-10-05 NOTE — Care Management Obs Status (Signed)
[facility] NOTIFICATION   Patient Details  Name: ANTIONO ETTINGER MRN: [id number] Date of Birth: 1923-10-31   Medicare Observation Status Notification Given:  Yes    Jolly Mango, RN 10/05/2018, 10:56 AM

## 2018-10-05 NOTE — Progress Notes (Signed)
[place] [facility] [place] NAME: Devin Washington    MR#:  [id number]  DATE OF BIRTH:  1923-07-03  SUBJECTIVE:  CHIEF COMPLAINT:   Chief Complaint  Patient presents with  . Fall   -Came in after a fall and noted to have right pubic rami fracture.   - still pain on movement- unable to walk more than 3 feet with PT - will need rehab at discharge  REVIEW OF SYSTEMS:  Review of Systems  Constitutional: Negative for chills and fever.  HENT: Positive for hearing loss. Negative for congestion, ear discharge and nosebleeds.   Eyes: Negative for blurred vision and double vision.  Respiratory: Negative for cough, shortness of breath and wheezing.   Cardiovascular: Negative for chest pain and palpitations.  Gastrointestinal: Negative for abdominal pain, constipation, diarrhea, nausea and vomiting.  Genitourinary: Negative for dysuria.  Musculoskeletal: Positive for joint pain and myalgias.  Neurological: Negative for dizziness, focal weakness, seizures, weakness and headaches.  Psychiatric/Behavioral: Negative for depression.    DRUG ALLERGIES:  No Known Allergies  VITALS:  Blood pressure (!) 152/71, pulse 87, temperature 98.2 F (36.8 C), temperature source Oral, resp. rate 18, height 5\' 10"  (1.778 m), weight 76.7 kg, SpO2 99 %.  PHYSICAL EXAMINATION:  Physical Exam  GENERAL:  84 y.o.-year-old elderly patient lying in the bed with no acute distress.  EYES: Pupils equal, round, reactive to light and accommodation. No scleral icterus. Extraocular muscles intact.  HEENT: Head atraumatic, normocephalic. Oropharynx and nasopharynx clear.  NECK:  Supple, no jugular venous distention. No thyroid enlargement, no tenderness.  LUNGS: Normal breath sounds bilaterally, no wheezing, rales,rhonchi or crepitation. No use of accessory muscles of respiration. Decreased bibasilar breath sounds CARDIOVASCULAR: S1, S2 normal. No  rubs, or gallops. 2/6 systolic  murmur present ABDOMEN: Soft, nontender, nondistended. Bowel sounds present. No organomegaly or mass.  EXTREMITIES: right leg guarded movement due to pain.  No pedal edema, cyanosis, or clubbing.  NEUROLOGIC: Cranial nerves II through XII are intact. Muscle strength 5/5 in all extremities. Sensation intact. Gait not checked.  PSYCHIATRIC: The patient is alert and oriented x 3.  SKIN: No obvious rash, lesion, or ulcer.    LABORATORY PANEL:   CBC Recent Labs  Lab 10/05/18 0334  WBC 11.5*  HGB 8.4*  HCT 26.2*  PLT 177   ------------------------------------------------------------------------------------------------------------------  Chemistries  Recent Labs  Lab 10/05/18 0334  NA 135  K 4.0  CL 103  CO2 28  GLUCOSE 119*  BUN 29*  CREATININE 1.58*  CALCIUM 8.7*   ------------------------------------------------------------------------------------------------------------------  Cardiac Enzymes No results for input(s): TROPONINI in the last 168 hours. ------------------------------------------------------------------------------------------------------------------  RADIOLOGY:  Dg Lumbar Spine 2-3 Views  Result Date: 10/03/2018 CLINICAL DATA:  Low back pain since a fall this morning. Initial encounter. EXAM: LUMBAR SPINE - 2-3 VIEW COMPARISON:  CT abdomen and pelvis 07/14/2016. FINDINGS: There is no acute abnormality. The patient has advanced multilevel degenerative disc disease and facet arthropathy. No traumatic listhesis. Aortic atherosclerosis noted. IMPRESSION: No acute finding.  Advanced multilevel degenerative disease. Electronically Signed   By: Inge Rise M.D.   On: 10/03/2018 13:56   Ct Pelvis W Contrast  Result Date: 10/03/2018 CLINICAL DATA:  Pelvis trauma with fracture suspected. Fall with right-sided pain. EXAM: CT PELVIS WITH CONTRAST TECHNIQUE: Multidetector CT imaging of the pelvis was performed using the standard protocol following the bolus  administration of intravenous contrast. CONTRAST:  47mL ISOVUE-300 IOPAMIDOL (ISOVUE-300) INJECTION 61% COMPARISON:  07/14/2016 abdominal  CT FINDINGS: Urinary Tract:  Negative bladder Bowel:  No acute finding. Vascular/Lymphatic: Extensive atherosclerotic calcification. Dilated right common iliac artery is with fusiform aneurysm measuring 2.5 cm. There is also lower abdominal aortic dilatation measuring 3.1 cm. Reproductive:  Prostatectomy Other:  Negative Musculoskeletal: Nondisplaced right superior pubic and inferior pubic ramus fracture with avulsed fragment at the abductor compartment. No detectable sacral ala fracture. No sacroiliac or symphysis pubis diastasis. Both hips are located. There is soft tissue stranding about the fractures. The muscles of the right abductor compartment are expanded and there is a wispy high-density that is partially covered within abductor musculature, cannot exclude injured vessel or focus of active bleeding. IMPRESSION: 1. Nondisplaced right inferior and superior pubic ramus fractures. 2. Strain of right abductor compartment with possible focus of active intramuscular hemorrhage (wispy high-density seen on the lowest slice is incompletely covered). Please observe for progressive swelling in the right thigh. 3. 3.1 cm abdominal aortic aneurysm with mild increase from 2017 (when it was 2.8 cm). 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 Electronically Signed   By: Monte Fantasia M.D.   On: 10/03/2018 15:48   Dg Hip Unilat  With Pelvis 2-3 Views Right  Result Date: 10/03/2018 CLINICAL DATA:  Right hip pain post fall. EXAM: DG HIP (WITH OR WITHOUT PELVIS) 2-3V RIGHT COMPARISON:  None. FINDINGS: There is no evidence of right hip fracture or dislocation. Osteopenia. Age-indeterminate fracture of the right superior pubic ramus. Questionable healed fracture of the inferior right pubic ramus. The left pubic bones are incompletely evaluated. Internal  rotation of the left hip, which is also incompletely evaluated. IMPRESSION: No evidence of right hip fracture. Age-indeterminate fracture of the right superior pubic ramus. Questionable healed fracture of the inferior right pubic ramus. Incomplete visualized left pelvic bones, and left hip, which is internally rotated. Please correlate to possible point of tenderness. Electronically Signed   By: Fidela Salisbury M.D.   On: 10/03/2018 14:02   Dg Femur Min 2 Views Right  Result Date: 10/03/2018 CLINICAL DATA:  Right hip pain after fall. EXAM: RIGHT FEMUR 2 VIEWS COMPARISON:  None. FINDINGS: There is no evidence of fracture or other focal bone lesions. Soft tissues are unremarkable. IMPRESSION: Negative. Electronically Signed   By: Marijo Conception, M.D.   On: 10/03/2018 14:00    EKG:   Orders placed or performed during the hospital encounter of 10/03/18  . EKG 12-Lead  . EKG 12-Lead    ASSESSMENT AND PLAN:   84 year old male with past medical history significant for cardiomyopathy, congestive heart failure, hiatal hernia, hypertension, PAD, pacemaker placement presents to hospital after a fall.  1.  Right pubic ramus fracture-secondary to mechanical fall -Appreciate orthopedics consult.  Weightbearing as tolerated -Pain control and physical therapy -Also CT of the pelvis showing strain of right abductor compartment with active intramuscular hemorrhage.  Continue to monitor closely. - hold off on heparin products for now. - PT recommending rehab  2.  CKD stage III-baseline creatinine around 1.6, stable at this time.  3.  Hypertension-on benazepril, Norvasc and metoprolol  4.  Cardiomyopathy-stable, euvolemic in appearance.  5.  GERD-PPI  6.  DVT prophylaxis-teds and SCDs  Physical Therapy consulted- needs rehab at discharge Social worker aware- awaiting insurance auth    All the records are reviewed and case discussed with Care Management/Social Workerr. Management plans  discussed with the patient, family and they are in agreement.  CODE STATUS: Full Code  TOTAL TIME TAKING  CARE OF THIS PATIENT: 36 minutes.   POSSIBLE D/C IN 2 DAYS, DEPENDING ON CLINICAL CONDITION.   Gladstone Lighter M.D on 10/05/2018 at 1:05 PM  Between 7am to 6pm - Pager - [phone]  After 6pm go to [email or web address] - password [email or web address] [facility]  Office  [phone]  CC: Primary care physician; Albina Billet, MD

## 2018-10-05 NOTE — Evaluation (Signed)
Physical Therapy Evaluation Patient Details Name: Devin Washington MRN: [id number] DOB: June 24, 1923 Today's Date: 10/05/2018   History of Present Illness  Pt is a 84 y.o. male with a known history of cardiomyopathy, congestive heart failure, hiatal hernia, hyperlipidemia, hypertension, peripheral arterial disease, pacemaker, prostate cancer presented to the emergency room for fall.  Patient wanted to go to the restroom but power was out and fell while trying to walk in the dark.  Patient was worked up with x-ray of the pelvis which showed a right pubic ramus fracture.  Was also worked up with CT pelvis which showed strain in the right abductor muscle compartment with intramuscular hemorrhage.  Advised monitoring.  Ortho consult completed with pt WBAT.      Clinical Impression  Pt presents with deficits in strength, transfers, mobility, gait, balance, and activity tolerance.  Pt required extensive assistance with bed mobility tasks and min A for sit to/from stand transfers from an elevated surface.  Pt was only able to ambulate a max of 3 feet at the EOB before requiring to sit secondary to pain in the R hip.  Pt reported no pain when at rest.  Pt would be unsafe to return to his prior living situation secondary to limited assistance available as well as having the need to ascend/descend stairs for home access.  Pt will benefit from PT services in a SNF setting upon discharge to safely address above deficits for decreased caregiver assistance and eventual return to PLOF.      Follow Up Recommendations SNF    Equipment Recommendations  None recommended by PT    Recommendations for Other Services       Precautions / Restrictions Precautions Precautions: Fall Restrictions Weight Bearing Restrictions: Yes RLE Weight Bearing: Weight bearing as tolerated      Mobility  Bed Mobility Overal bed mobility: Needs Assistance Bed Mobility: Rolling;Sit to Sidelying;Sidelying to Sit Rolling: Min  assist Sidelying to sit: Mod assist     Sit to sidelying: Mod assist General bed mobility comments: Log roll technique training with mod verbal cues for sequencing along with mod physical assistance  Transfers Overall transfer level: Needs assistance Equipment used: Rolling walker (2 wheeled) Transfers: Sit to/from Stand Sit to Stand: Min assist;From elevated surface         General transfer comment: Min verbal cues for sequencing  Ambulation/Gait Ambulation/Gait assistance: Min guard Gait Distance (Feet): 3 Feet Assistive device: Rolling walker (2 wheeled) Gait Pattern/deviations: Step-to pattern;Antalgic;Trunk flexed;Decreased stance time - right Gait velocity: Decreased   General Gait Details: Antalgic gait on the RLE with decreased RLE stance time.  Mod verbal cues for sequencing with the RW for pain control.  Stairs            Wheelchair Mobility    Modified Rankin (Stroke Patients Only)       Balance Overall balance assessment: Mild deficits observed, not formally tested                                           Pertinent Vitals/Pain Pain Assessment: No/denies pain    Home Living Family/patient expects to be discharged to:: Private residence Living Arrangements: Alone Available Help at Discharge: Family;Available PRN/intermittently Type of Home: House Home Access: Stairs to enter Entrance Stairs-Rails: Left Entrance Stairs-Number of Steps: 2 Home Layout: One level Home Equipment: Walker - 2 wheels;Cane - single point  Prior Function Level of Independence: Independent with assistive device(s)         Comments: Mod Ind with amb with a RW limited community distances, Ind with ADLs, one other fall in the last year when tripped on a flower pot in the yard     Hand Dominance   Dominant Hand: Right    Extremity/Trunk Assessment   Upper Extremity Assessment Upper Extremity Assessment: Overall WFL for tasks assessed     Lower Extremity Assessment Lower Extremity Assessment: Generalized weakness;RLE deficits/detail RLE: Unable to fully assess due to pain RLE Sensation: WNL       Communication   Communication: No difficulties  Cognition Arousal/Alertness: Awake/alert Behavior During Therapy: WFL for tasks assessed/performed Overall Cognitive Status: Within Functional Limits for tasks assessed                                        General Comments      Exercises Total Joint Exercises Ankle Circles/Pumps: AROM;Both;5 reps;10 reps Quad Sets: Strengthening;Both;10 reps;5 reps Gluteal Sets: Strengthening;Both;10 reps;5 reps Towel Squeeze: Strengthening;Both;5 reps Heel Slides: AAROM;AROM;Both;5 reps Hip ABduction/ADduction: AROM;Left;10 reps Long Arc Quad: AROM;Both;5 reps;10 reps Knee Flexion: AROM;5 reps;Both;10 reps Marching in Standing: AROM;Left;10 reps;Seated Other Exercises Other Exercises: HEP education for BLE APs, QS, and GS x 10 each 5-6x/day as tolerated   Assessment/Plan    PT Assessment Patient needs continued PT services  PT Problem List Decreased strength;Decreased activity tolerance;Decreased balance;Decreased mobility;Decreased knowledge of use of DME       PT Treatment Interventions DME instruction;Gait training;Stair training;Functional mobility training;Balance training;Therapeutic exercise;Therapeutic activities;Patient/family education    PT Goals (Current goals can be found in the Care Plan section)  Acute Rehab PT Goals Patient Stated Goal: To walk better PT Goal Formulation: With patient Time For Goal Achievement: 10/18/18 Potential to Achieve Goals: Good    Frequency 7X/week   Barriers to discharge Inaccessible home environment;Decreased caregiver support      Co-evaluation               AM-PAC PT "6 Clicks" Daily Activity  Outcome Measure Difficulty turning over in bed (including adjusting bedclothes, sheets and blankets)?:  Unable Difficulty moving from lying on back to sitting on the side of the bed? : Unable Difficulty sitting down on and standing up from a chair with arms (e.g., wheelchair, bedside commode, etc,.)?: Unable Help needed moving to and from a bed to chair (including a wheelchair)?: A Little Help needed walking in hospital room?: A Lot Help needed climbing 3-5 steps with a railing? : Total 6 Click Score: 9    End of Session Equipment Utilized During Treatment: Gait belt Activity Tolerance: Patient limited by pain Patient left: in chair;with chair alarm set;with SCD's reapplied;with call bell/phone within reach Nurse Communication: Mobility status PT Visit Diagnosis: Unsteadiness on feet (R26.81);History of falling (Z91.81);Difficulty in walking, not elsewhere classified (R26.2);Muscle weakness (generalized) (M62.81)    Time: 4332-9518 PT Time Calculation (min) (ACUTE ONLY): 33 min   Charges:   PT Evaluation $PT Eval Low Complexity: 1 Low PT Treatments $Therapeutic Exercise: 8-22 mins        D. Scott Roger Kettles PT, DPT 10/05/18, 11:41 AM

## 2018-10-05 NOTE — Care Management (Signed)
Attempted to deliver observation notice but physical therapy was working with patient. Will re attempt at a later time.

## 2018-10-05 NOTE — Progress Notes (Signed)
PT is recommending SNF. Clinical Education officer, museum (CSW) met with patient alone at bedside and provided bed offers. Patient chose [facility]. Per Tiffany admissions coordinator at [facility] she will start [facility] authorization today.   [employer], LCSW [phone]

## 2018-10-06 ENCOUNTER — Ambulatory Visit (INDEPENDENT_AMBULATORY_CARE_PROVIDER_SITE_OTHER): Payer: Medicare HMO | Admitting: Vascular Surgery

## 2018-10-06 ENCOUNTER — Encounter (INDEPENDENT_AMBULATORY_CARE_PROVIDER_SITE_OTHER): Payer: Medicare HMO

## 2018-10-06 DIAGNOSIS — S32599A Other specified fracture of unspecified pubis, initial encounter for closed fracture: Secondary | ICD-10-CM | POA: Diagnosis not present

## 2018-10-06 DIAGNOSIS — M6289 Other specified disorders of muscle: Secondary | ICD-10-CM | POA: Diagnosis not present

## 2018-10-06 DIAGNOSIS — W19XXXA Unspecified fall, initial encounter: Secondary | ICD-10-CM | POA: Diagnosis not present

## 2018-10-06 DIAGNOSIS — K922 Gastrointestinal hemorrhage, unspecified: Secondary | ICD-10-CM | POA: Diagnosis not present

## 2018-10-06 LAB — HEMOGLOBIN AND HEMATOCRIT, BLOOD
HCT: 25.8 % — ABNORMAL LOW (ref 39.0–52.0)
Hemoglobin: 8.2 g/dL — ABNORMAL LOW (ref 13.0–17.0)

## 2018-10-06 MED ORDER — HYDROCORTISONE ACETATE 25 MG RE SUPP
25.0000 mg | Freq: Two times a day (BID) | RECTAL | Status: DC
  Administered 2018-10-06 – 2018-10-07 (×2): 25 mg via RECTAL
  Filled 2018-10-06 (×3): qty 1

## 2018-10-06 MED ORDER — SENNOSIDES-DOCUSATE SODIUM 8.6-50 MG PO TABS
1.0000 | ORAL_TABLET | Freq: Two times a day (BID) | ORAL | Status: DC
  Administered 2018-10-06 – 2018-10-07 (×2): 1 via ORAL
  Filled 2018-10-06 (×2): qty 1

## 2018-10-06 MED ORDER — POLYETHYLENE GLYCOL 3350 17 G PO PACK: 17.0000 g | PACK | Freq: Every day | ORAL | Status: DC | PRN

## 2018-10-06 NOTE — Progress Notes (Signed)
Per Tiffany admissions coordinator at [facility] authorization has been received. Per MD patient will be stable for D/C likely tomorrow. Patient is aware of above.   [employer], LCSW [phone]

## 2018-10-06 NOTE — Progress Notes (Signed)
[facility] [place] NAME: Devin Washington    MR#:  [id number]  DATE OF BIRTH:  August 04, 1923  SUBJECTIVE:  CHIEF COMPLAINT:   Chief Complaint  Patient presents with  . Fall   -Came in after a fall and noted to have right pubic rami fracture.  -Reporting fresh blood after his bowel movement today after  he manually dislodged the stool with a history of hemorrhoids - still pain on movement- unable to walk more than 3 feet with PT - will need rehab at discharge  REVIEW OF SYSTEMS:  Review of Systems  Constitutional: Negative for chills and fever.  HENT: Positive for hearing loss. Negative for congestion, ear discharge and nosebleeds.   Eyes: Negative for blurred vision and double vision.  Respiratory: Negative for cough, shortness of breath and wheezing.   Cardiovascular: Negative for chest pain and palpitations.  Gastrointestinal: Negative for abdominal pain, constipation, diarrhea, nausea and vomiting.  Genitourinary: Negative for dysuria.  Musculoskeletal: Positive for joint pain and myalgias.  Neurological: Negative for dizziness, focal weakness, seizures, weakness and headaches.  Psychiatric/Behavioral: Negative for depression.    DRUG ALLERGIES:  No Known Allergies  VITALS:  Blood pressure (!) 130/55, pulse 68, temperature 98.7 F (37.1 C), temperature source Oral, resp. rate 19, height 5\' 10"  (1.778 m), weight 76.7 kg, SpO2 100 %.  PHYSICAL EXAMINATION:  Physical Exam  GENERAL:  84 y.o.-year-old elderly patient lying in the bed with no acute distress.  EYES: Pupils equal, round, reactive to light and accommodation. No scleral icterus. Extraocular muscles intact.  HEENT: Head atraumatic, normocephalic. Oropharynx and nasopharynx clear.  NECK:  Supple, no jugular venous distention. No thyroid enlargement, no tenderness.  LUNGS: Normal breath sounds bilaterally, no wheezing, rales,rhonchi or crepitation. No use of accessory  muscles of respiration. Decreased bibasilar breath sounds CARDIOVASCULAR: S1, S2 normal. No  rubs, or gallops. 2/6 systolic murmur present ABDOMEN: Soft, nontender, nondistended. Bowel sounds present. No organomegaly or mass.  EXTREMITIES: right leg guarded movement due to pain.  No pedal edema, cyanosis, or clubbing.  NEUROLOGIC: Cranial nerves II through XII are intact. Muscle strength 5/5 in all extremities. Sensation intact. Gait not checked.  PSYCHIATRIC: The patient is alert and oriented x 3.  SKIN: No obvious rash, lesion, or ulcer.    LABORATORY PANEL:   CBC Recent Labs  Lab 10/05/18 0334  WBC 11.5*  HGB 8.4*  HCT 26.2*  PLT 177   ------------------------------------------------------------------------------------------------------------------  Chemistries  Recent Labs  Lab 10/05/18 0334  NA 135  K 4.0  CL 103  CO2 28  GLUCOSE 119*  BUN 29*  CREATININE 1.58*  CALCIUM 8.7*   ------------------------------------------------------------------------------------------------------------------  Cardiac Enzymes No results for input(s): TROPONINI in the last 168 hours. ------------------------------------------------------------------------------------------------------------------  RADIOLOGY:  No results found.  EKG:   Orders placed or performed during the hospital encounter of 10/03/18  . EKG 12-Lead  . EKG 12-Lead    ASSESSMENT AND PLAN:   84 year old male with past medical history significant for cardiomyopathy, congestive heart failure, hiatal hernia, hypertension, PAD, pacemaker placement presents to hospital after a fall.  1.  Lower GI bleed -probably from internal hemorrhoids/mechanical trauma  Will check hemoglobin hematocrit and check CBC in a.m. Will discharge patient if hemoglobin is stable Anusol rectal suppositories If bleeding continues will consult GI   2.  CKD stage III-baseline creatinine around 1.6, stable at this time.  3.   Hypertension-on benazepril, Norvasc and metoprolol  4.  Cardiomyopathy-stable, euvolemic  in appearance.  5. Right pubic ramus fracture-secondary to mechanical fall -Appreciate orthopedics consult.  Weightbearing as tolerated -Pain control and physical therapy -Also CT of the pelvis showing strain of right abductor compartment with active intramuscular hemorrhage.  Continue to monitor closely. - hold off on heparin products for now. - PT recommending rehab   6. GERD-PPI  .  DVT prophylaxis-teds and SCDs  Physical Therapy consulted- needs rehab at discharge Social worker aware-received insurance authorization    All the records are reviewed and case discussed with Care Management/Social Workerr. Management plans discussed with the patient, family and they are in agreement.  CODE STATUS: Full Code  TOTAL TIME TAKING CARE OF THIS PATIENT: 36 minutes.   POSSIBLE D/C IN 1 DAYS, DEPENDING ON CLINICAL CONDITION.   Nicholes Mango M.D on 10/06/2018 at 3:48 PM  Between 7am to 6pm - Pager - [phone]  After 6pm go to [email or web address] - password [email or web address] [facility]  Office  [phone]  CC: Primary care physician; Albina Billet, MD

## 2018-10-06 NOTE — Progress Notes (Signed)
Physical Therapy Treatment Patient Details Name: Devin Washington MRN: [id number] DOB: September 06, 1923 Today's Date: 10/06/2018    History of Present Illness Pt is a 84 y.o. male with a known history of cardiomyopathy, congestive heart failure, hiatal hernia, hyperlipidemia, hypertension, peripheral arterial disease, pacemaker, prostate cancer presented to the emergency room for fall.  Patient wanted to go to the restroom but power was out and fell while trying to walk in the dark.  Patient was worked up with x-ray of the pelvis which showed a right pubic ramus fracture.  Was also worked up with CT pelvis which showed strain in the right abductor muscle compartment with intramuscular hemorrhage.  Advised monitoring.  Ortho consult completed with pt WBAT.      PT Comments    Initial attempt, pt on BSC. Second attempt, pt finishing on [facility] and nursing assistant in with pt. Pt agreeable to therapist assisting/treatment. Pt requires Min A for STS transfer and Min guard/assist for static stand for personal hygiene. Pt wishes up in chair, but unable to walk distance around bed. Pt transfers back to bed and out other side for closer proximity to chair. Requires Mod A for bed mobility. STS and several steps bed to chair with Min A. Cues for hand placement with sit. Once pt in chair, pt feels he needs to use the bathroom again. Transfers and short ambulation again performed to/from [facility] with assisted stand for personal hygiene. Once pt in chair again, pt fatigued. Pt educated briefly on performing ankle pumps, QS and glut sets while up in chair. Continue PT to progress strength, endurance to improve all functional mobility.    Follow Up Recommendations  SNF     Equipment Recommendations  None recommended by PT    Recommendations for Other Services       Precautions / Restrictions Precautions Precautions: Fall Restrictions Weight Bearing Restrictions: Yes RLE Weight Bearing: Weight bearing as tolerated     Mobility  Bed Mobility Overal bed mobility: Needs Assistance Bed Mobility: Supine to Sit   Sidelying to sit: Mod assist Supine to sit: Mod assist     General bed mobility comments: cues for sequence  Transfers Overall transfer level: Needs assistance Equipment used: Rolling walker (2 wheeled) Transfers: Sit to/from Stand Sit to Stand: Min assist            Ambulation/Gait Ambulation/Gait assistance: Min guard Gait Distance (Feet): 3 Feet(3 times) Assistive device: Rolling walker (2 wheeled) Gait Pattern/deviations: Step-to pattern     General Gait Details: BSC to/from bed and recliner to/from [facility]   Stairs             Wheelchair Mobility    Modified Rankin (Stroke Patients Only)       Balance Overall balance assessment: Needs assistance Sitting-balance support: Feet supported;Bilateral upper extremity supported Sitting balance-Leahy Scale: Fair     Standing balance support: Bilateral upper extremity supported Standing balance-Leahy Scale: Fair                              Cognition Arousal/Alertness: Awake/alert Behavior During Therapy: WFL for tasks assessed/performed Overall Cognitive Status: Within Functional Limits for tasks assessed                                        Exercises      General Comments  Pertinent Vitals/Pain Pain Assessment: 0-10 Pain Score: 4  Pain Location: R hip/LE Pain Intervention(s): Monitored during session;Limited activity within patient's tolerance    Home Living                      Prior Function            PT Goals (current goals can now be found in the care plan section) Progress towards PT goals: Progressing toward goals    Frequency    7X/week      PT Plan Current plan remains appropriate    Co-evaluation              AM-PAC PT "6 Clicks" Daily Activity  Outcome Measure  Difficulty turning over in bed (including adjusting  bedclothes, sheets and blankets)?: Unable Difficulty moving from lying on back to sitting on the side of the bed? : Unable Difficulty sitting down on and standing up from a chair with arms (e.g., wheelchair, bedside commode, etc,.)?: Unable Help needed moving to and from a bed to chair (including a wheelchair)?: A Little Help needed walking in hospital room?: A Lot Help needed climbing 3-5 steps with a railing? : Total 6 Click Score: 9    End of Session Equipment Utilized During Treatment: Gait belt Activity Tolerance: Patient limited by pain;Patient limited by fatigue Patient left: in chair;with call bell/phone within reach;with chair alarm set;with nursing/sitter in room   PT Visit Diagnosis: Unsteadiness on feet (R26.81);History of falling (Z91.81);Difficulty in walking, not elsewhere classified (R26.2);Muscle weakness (generalized) (M62.81)     Time: 9038-3338 PT Time Calculation (min) (ACUTE ONLY): 24 min  Charges:  $Gait Training: 8-22 mins $Therapeutic Activity: 8-22 mins                      Larae Grooms, PTA 10/06/2018, 2:42 PM

## 2018-10-07 DIAGNOSIS — D509 Iron deficiency anemia, unspecified: Secondary | ICD-10-CM | POA: Diagnosis not present

## 2018-10-07 DIAGNOSIS — W19XXXA Unspecified fall, initial encounter: Secondary | ICD-10-CM | POA: Diagnosis not present

## 2018-10-07 DIAGNOSIS — I739 Peripheral vascular disease, unspecified: Secondary | ICD-10-CM | POA: Diagnosis not present

## 2018-10-07 DIAGNOSIS — E785 Hyperlipidemia, unspecified: Secondary | ICD-10-CM | POA: Diagnosis not present

## 2018-10-07 DIAGNOSIS — K922 Gastrointestinal hemorrhage, unspecified: Secondary | ICD-10-CM | POA: Diagnosis not present

## 2018-10-07 DIAGNOSIS — Z993 Dependence on wheelchair: Secondary | ICD-10-CM | POA: Diagnosis not present

## 2018-10-07 DIAGNOSIS — M6289 Other specified disorders of muscle: Secondary | ICD-10-CM | POA: Diagnosis not present

## 2018-10-07 DIAGNOSIS — I5022 Chronic systolic (congestive) heart failure: Secondary | ICD-10-CM | POA: Diagnosis not present

## 2018-10-07 DIAGNOSIS — N183 Chronic kidney disease, stage 3 (moderate): Secondary | ICD-10-CM | POA: Diagnosis not present

## 2018-10-07 DIAGNOSIS — S32501D Unspecified fracture of right pubis, subsequent encounter for fracture with routine healing: Secondary | ICD-10-CM | POA: Diagnosis not present

## 2018-10-07 DIAGNOSIS — W19XXXD Unspecified fall, subsequent encounter: Secondary | ICD-10-CM | POA: Diagnosis not present

## 2018-10-07 DIAGNOSIS — I509 Heart failure, unspecified: Secondary | ICD-10-CM | POA: Diagnosis not present

## 2018-10-07 DIAGNOSIS — I429 Cardiomyopathy, unspecified: Secondary | ICD-10-CM | POA: Diagnosis not present

## 2018-10-07 DIAGNOSIS — Z950 Presence of cardiac pacemaker: Secondary | ICD-10-CM | POA: Diagnosis not present

## 2018-10-07 DIAGNOSIS — I495 Sick sinus syndrome: Secondary | ICD-10-CM | POA: Diagnosis not present

## 2018-10-07 DIAGNOSIS — R05 Cough: Secondary | ICD-10-CM | POA: Diagnosis not present

## 2018-10-07 DIAGNOSIS — M810 Age-related osteoporosis without current pathological fracture: Secondary | ICD-10-CM | POA: Diagnosis not present

## 2018-10-07 DIAGNOSIS — S32599A Other specified fracture of unspecified pubis, initial encounter for closed fracture: Secondary | ICD-10-CM | POA: Diagnosis not present

## 2018-10-07 DIAGNOSIS — Z7401 Bed confinement status: Secondary | ICD-10-CM | POA: Diagnosis not present

## 2018-10-07 DIAGNOSIS — M8000XD Age-related osteoporosis with current pathological fracture, unspecified site, subsequent encounter for fracture with routine healing: Secondary | ICD-10-CM | POA: Diagnosis not present

## 2018-10-07 DIAGNOSIS — S32591A Other specified fracture of right pubis, initial encounter for closed fracture: Secondary | ICD-10-CM | POA: Diagnosis not present

## 2018-10-07 DIAGNOSIS — M6281 Muscle weakness (generalized): Secondary | ICD-10-CM | POA: Diagnosis not present

## 2018-10-07 DIAGNOSIS — I4891 Unspecified atrial fibrillation: Secondary | ICD-10-CM | POA: Diagnosis not present

## 2018-10-07 DIAGNOSIS — I130 Hypertensive heart and chronic kidney disease with heart failure and stage 1 through stage 4 chronic kidney disease, or unspecified chronic kidney disease: Secondary | ICD-10-CM | POA: Diagnosis not present

## 2018-10-07 DIAGNOSIS — S329XXA Fracture of unspecified parts of lumbosacral spine and pelvis, initial encounter for closed fracture: Secondary | ICD-10-CM | POA: Diagnosis not present

## 2018-10-07 DIAGNOSIS — S32591D Other specified fracture of right pubis, subsequent encounter for fracture with routine healing: Secondary | ICD-10-CM | POA: Diagnosis not present

## 2018-10-07 LAB — CBC
HEMATOCRIT: 24.5 % — AB (ref 39.0–52.0)
HEMOGLOBIN: 7.8 g/dL — AB (ref 13.0–17.0)
MCH: 29.2 pg (ref 26.0–34.0)
MCHC: 31.8 g/dL (ref 30.0–36.0)
MCV: 91.8 fL (ref 80.0–100.0)
NRBC: 0 % (ref 0.0–0.2)
Platelets: 182 10*3/uL (ref 150–400)
RBC: 2.67 MIL/uL — ABNORMAL LOW (ref 4.22–5.81)
RDW: 15.9 % — ABNORMAL HIGH (ref 11.5–15.5)
WBC: 11.2 10*3/uL — ABNORMAL HIGH (ref 4.0–10.5)

## 2018-10-07 LAB — HEMOGLOBIN AND HEMATOCRIT, BLOOD
HCT: 26.5 % — ABNORMAL LOW (ref 39.0–52.0)
Hemoglobin: 8.4 g/dL — ABNORMAL LOW (ref 13.0–17.0)

## 2018-10-07 MED ORDER — SENNOSIDES-DOCUSATE SODIUM 8.6-50 MG PO TABS: 1.0000 | ORAL_TABLET | Freq: Two times a day (BID) | ORAL | Status: DC

## 2018-10-07 MED ORDER — TRAMADOL HCL 50 MG PO TABS: 50.0000 mg | ORAL_TABLET | Freq: Four times a day (QID) | ORAL | 0 refills | Status: AC | PRN

## 2018-10-07 MED ORDER — HYDROCORTISONE ACETATE 25 MG RE SUPP: 25.0000 mg | Freq: Two times a day (BID) | RECTAL | 0 refills | Status: DC

## 2018-10-07 MED ORDER — POLYETHYLENE GLYCOL 3350 17 G PO PACK: 17.0000 g | PACK | Freq: Every day | ORAL | 0 refills | Status: DC | PRN

## 2018-10-07 NOTE — Progress Notes (Signed)
Patient is medically stable for D/C to [facility] today. Per [facility] admissions coordinator at [facility] authorization was received yesterday and patient can come today to room 408. RN will call report and arrange EMS for transport. Clinical Education officer, museum (CSW) sent D/C orders to [facility] via [place]. Patient is aware of above. CSW contacted patient's granddaughter Kenney Houseman and made her aware of above. Please reconsult if future social work needs arise. CSW signing off.   [employer], LCSW [phone]

## 2018-10-07 NOTE — Discharge Instructions (Signed)
With primary care physician at the facility in 2 to 3 days Follow-up with gastroenterology Dr. Vicente Males as needed

## 2018-10-07 NOTE — Discharge Summary (Signed)
[facility] NAME: Devin Washington    MR#:  [id number]  DATE OF BIRTH:  October 03, 1923  DATE OF ADMISSION:  10/03/2018 ADMITTING PHYSICIAN: Saundra Shelling, MD  DATE OF DISCHARGE:  10/07/18 PRIMARY CARE PHYSICIAN: Albina Billet, MD    ADMISSION DIAGNOSIS:  Fall, initial encounter [phone] Closed fracture of ramus of right pubis, initial encounter ([place]) [S32.591A]  DISCHARGE DIAGNOSIS:  Active Problems:   Pubic ramus fracture (HCC) Hemorrhoids  SECONDARY DIAGNOSIS:   Past Medical History:  Diagnosis Date  . A-fib ([place])   . Cardiomyopathy ([place])   . CHF (congestive heart failure) ([place])   . DJD (degenerative joint disease)   . Duodenal ulcer 08/09/2015  . Dysrhythmia   . Erosive esophagitis   . HH (hiatus hernia)   . HLD (hyperlipidemia)   . Hypertension   . Pacemaker   . PAD (peripheral artery disease) ([place])   . Peripheral vascular disease ([place])   . Presence of permanent cardiac pacemaker   . Prostate cancer [facility])    Prostatectomy.  . 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 Shortness of breath   . SSS (sick sinus syndrome) [facility])     HOSPITAL COURSE:   HISTORY OF PRESENT ILLNESS: Devin Washington  is a 84 y.o. male with a known history of cardiomyopathy, congestive heart failure, hiatal hernia, hyperlipidemia, hypertension, peripheral arterial disease, pacemaker, prostate cancer presented to the emergency room for fall.  Patient got up this morning lights for switched off and he wanted to go to the restroom.  He lost balance and fell down and landed on right hip.  Patient has pain in the right hip which is aching in nature 6 out of 10 on a scale of 1-10.  He also complains of pain in the right thigh.  Patient was worked up with x-ray of the pelvis which showed a right pubic ramus fracture.  Was also worked up with CT pelvis which showed strain in the right abductor muscle compartment with intramuscular hemorrhage.  Advised monitoring.   Orthopedic surgery was consulted by ER physician.  1.  Lower GI bleed  only one time-probably from internal hemorrhoids/mechanical trauma  No other episodes of bleeding.  Patient is a symptomatic Anusol rectal suppositories repeat hemoglobin 8.2-7.8-8.4 If bleeding recurs needs outpatient GI follow-up.  Discussed with Dr. Vicente Males agreeable with the current plan   2.  CKD stage III-baseline creatinine around 1.6, stable at this time.  3.  Hypertension-on benazepril, Norvasc and metoprolol  4.  Cardiomyopathy-stable, euvolemic in appearance.  5. Right pubic ramus fracture-secondary to mechanical fall -Appreciate orthopedics consult.  Weightbearing as tolerated -Pain control and physical therapy -Also CT of the pelvis showing strain of right abductor compartment with active intramuscular hemorrhage.  Continue to monitor closely. - hold off on heparin products for now. - PT recommending rehab, patient is agreeable   6. GERD-PPI  .  DVT prophylaxis-teds and SCDs  Physical Therapy consulted- needs rehab at discharge Social worker aware-received insurance authorization Discharge patient to [place] CONDITIONS:   Stable  CONSULTS OBTAINED:  Treatment Team:  Lovell Sheehan, MD   PROCEDURES  None   DRUG ALLERGIES:  No Known Allergies  DISCHARGE MEDICATIONS:   Allergies as of 10/07/2018   No Known Allergies     Medication List    STOP taking these medications   aspirin 81 MG EC tablet   docusate sodium 100 MG capsule Commonly known as:  COLACE  TAKE these medications   acetaminophen 325 MG tablet Commonly known as:  TYLENOL Take 2 tablets (650 mg total) by mouth every 6 (six) hours as needed for mild pain (or Fever >/= 101).   albuterol 108 (90 Base) MCG/ACT inhaler Commonly known as:  PROVENTIL HFA;VENTOLIN HFA Inhale 2 puffs into the lungs every 6 (six) hours as needed for wheezing or shortness of breath.   amLODipine 2.5 MG  tablet Commonly known as:  NORVASC Take 2.5 mg by mouth daily.   ANORO ELLIPTA 62.5-25 MCG/INH Aepb Generic drug:  umeclidinium-vilanterol Inhale 1 puff into the lungs 2 (two) times daily.   benazepril 20 MG tablet Commonly known as:  LOTENSIN Take 1 tablet (20 mg total) by mouth daily.   cyanocobalamin 1000 MCG tablet Take 1 tablet (1,000 mcg total) by mouth daily.   feeding supplement (ENSURE ENLIVE) Liqd Take 237 mLs by mouth 2 (two) times daily between meals.   ferrous sulfate 325 (65 FE) MG tablet Take 1 tablet (325 mg total) by mouth 2 (two) times daily with a meal.   furosemide 20 MG tablet Commonly known as:  LASIX Take 1 tablet by mouth daily.   hydrocortisone 25 MG suppository Commonly known as:  ANUSOL-HC Place 1 suppository (25 mg total) rectally 2 (two) times daily.   metoprolol tartrate 25 MG tablet Commonly known as:  LOPRESSOR Take 1 tablet (25 mg total) by mouth 2 (two) times daily.   pantoprazole 40 MG tablet Commonly known as:  PROTONIX Take 1 tablet (40 mg total) by mouth daily.   polyethylene glycol packet Commonly known as:  MIRALAX / GLYCOLAX Take 17 g by mouth daily as needed for moderate constipation.   senna-docusate 8.6-50 MG tablet Commonly known as:  Senokot-S Take 1 tablet by mouth 2 (two) times daily.   simvastatin 20 MG tablet Commonly known as:  ZOCOR Take 20 mg by mouth daily.   traMADol 50 MG tablet Commonly known as:  ULTRAM Take 1 tablet (50 mg total) by mouth every 6 (six) hours as needed for moderate pain or severe pain. What changed:  when to take this   traZODone 50 MG tablet Commonly known as:  DESYREL Take 1 tablet by mouth at bedtime as needed for sleep.        DISCHARGE INSTRUCTIONS:  With primary care physician at the facility in 2 to 3 days Follow-up with gastroenterology Dr. Vicente Males as needed Follow-up with orthopedics Dr. Harlow Mares on 10/19/2018 at 2:30 PM DIET:  Cardiac diet  DISCHARGE CONDITION:   Stable  ACTIVITY:  Activity as tolerated  OXYGEN:  Home Oxygen: No.   Oxygen Delivery: room air  DISCHARGE LOCATION:  nursing home   If you experience worsening of your admission symptoms, develop shortness of breath, life threatening emergency, suicidal or homicidal thoughts you must seek medical attention immediately by calling 911 or calling your MD immediately  if symptoms less severe.  You Must read complete instructions/literature along with all the possible adverse reactions/side effects for all the Medicines you take and that have been prescribed to you. Take any new Medicines after you have completely understood and accpet all the possible adverse reactions/side effects.   Please note  You were cared for by a hospitalist during your hospital stay. If you have any questions about your discharge medications or the care you received while you were in the hospital after you are discharged, you can call the unit and asked to speak with the hospitalist on call if the  hospitalist that took care of you is not available. Once you are discharged, your primary care physician will handle any further medical issues. Please note that NO REFILLS for any discharge medications will be authorized once you are discharged, as it is imperative that you return to your primary care physician (or establish a relationship with a primary care physician if you do not have one) for your aftercare needs so that they can reassess your need for medications and monitor your lab values.     Today  Chief Complaint  Patient presents with  . Fall   Patient is resting comfortably.  Pain is well tolerated.  Denies any other episodes of GI bleed, hemodynamically stable.  Hemoglobin is stable.  Okay to discharge patient ROS:  CONSTITUTIONAL: Denies fevers, chills. Denies any fatigue, weakness.  EYES: Denies blurry vision, double vision, eye pain. EARS, NOSE, THROAT: Denies tinnitus, ear pain, hearing  loss. RESPIRATORY: Denies cough, wheeze, shortness of breath.  CARDIOVASCULAR: Denies chest pain, palpitations, edema.  GASTROINTESTINAL: Denies nausea, vomiting, diarrhea, abdominal pain. Denies bright red blood per rectum. GENITOURINARY: Denies dysuria, hematuria. ENDOCRINE: Denies nocturia or thyroid problems. HEMATOLOGIC AND LYMPHATIC: Denies easy bruising or bleeding. SKIN: Denies rash or lesion. MUSCULOSKELETAL: Has hip pain from pubic ramus fracture NEUROLOGIC: Denies paralysis, paresthesias.  PSYCHIATRIC: Denies anxiety or depressive symptoms.   VITAL SIGNS:  Blood pressure 140/60, pulse 69, temperature 98 F (36.7 C), temperature source Oral, resp. rate 18, height 5\' 10"  (1.778 m), weight 76.7 kg, SpO2 98 %.  I/O:    Intake/Output Summary (Last 24 hours) at 10/07/2018 1122 Last data filed at 10/07/2018 0900 Gross per 24 hour  Intake 600 ml  Output 500 ml  Net 100 ml    PHYSICAL EXAMINATION:  GENERAL:  84 y.o.-year-old patient lying in the bed with no acute distress.  EYES: Pupils equal, round, reactive to light and accommodation. No scleral icterus. Extraocular muscles intact.  HEENT: Head atraumatic, normocephalic. Oropharynx and nasopharynx clear.  NECK:  Supple, no jugular venous distention. No thyroid enlargement, no tenderness.  LUNGS: Normal breath sounds bilaterally, no wheezing, rales,rhonchi or crepitation. No use of accessory muscles of respiration.  CARDIOVASCULAR: S1, S2 normal. No murmurs, rubs, or gallops.  ABDOMEN: Soft, non-tender, non-distended. Bowel sounds present. No organomegaly or mass.  EXTREMITIES: No pedal edema, cyanosis, or clubbing.  NEUROLOGIC:. Hip  Is tender from pubic ramus fracture sensation intact. Gait not checked.  PSYCHIATRIC: The patient is alert and oriented x 3.  SKIN: No obvious rash, lesion, or ulcer.   DATA REVIEW:   CBC Recent Labs  Lab 10/07/18 0407 10/07/18 0950  WBC 11.2*  --   HGB 7.8* 8.4*  HCT 24.5* 26.5*   PLT 182  --     Chemistries  Recent Labs  Lab 10/05/18 0334  NA 135  K 4.0  CL 103  CO2 28  GLUCOSE 119*  BUN 29*  CREATININE 1.58*  CALCIUM 8.7*    Cardiac Enzymes No results for input(s): TROPONINI in the last 168 hours.  Microbiology Results  Results for orders placed or performed during the hospital encounter of 11/27/17  Surgical pcr screen     Status: None   Collection Time: 11/27/17  9:39 AM  Result Value Ref Range Status   MRSA, PCR NEGATIVE NEGATIVE Final   Staphylococcus aureus NEGATIVE NEGATIVE Final    Comment: (NOTE) The Xpert SA Assay (FDA approved for NASAL specimens in patients 55 years of age and older), is one component of a  comprehensive surveillance program. It is not intended to diagnose infection nor to guide or monitor treatment.     RADIOLOGY:  Dg Lumbar Spine 2-3 Views  Result Date: 10/03/2018 CLINICAL DATA:  Low back pain since a fall this morning. Initial encounter. EXAM: LUMBAR SPINE - 2-3 VIEW COMPARISON:  CT abdomen and pelvis 07/14/2016. FINDINGS: There is no acute abnormality. The patient has advanced multilevel degenerative disc disease and facet arthropathy. No traumatic listhesis. Aortic atherosclerosis noted. IMPRESSION: No acute finding.  Advanced multilevel degenerative disease. Electronically Signed   By: Inge Rise M.D.   On: 10/03/2018 13:56   Ct Pelvis W Contrast  Result Date: 10/03/2018 CLINICAL DATA:  Pelvis trauma with fracture suspected. Fall with right-sided pain. EXAM: CT PELVIS WITH CONTRAST TECHNIQUE: Multidetector CT imaging of the pelvis was performed using the standard protocol following the bolus administration of intravenous contrast. CONTRAST:  38mL ISOVUE-300 IOPAMIDOL (ISOVUE-300) INJECTION 61% COMPARISON:  07/14/2016 abdominal CT FINDINGS: Urinary Tract:  Negative bladder Bowel:  No acute finding. Vascular/Lymphatic: Extensive atherosclerotic calcification. Dilated right common iliac artery is with fusiform  aneurysm measuring 2.5 cm. There is also lower abdominal aortic dilatation measuring 3.1 cm. Reproductive:  Prostatectomy Other:  Negative Musculoskeletal: Nondisplaced right superior pubic and inferior pubic ramus fracture with avulsed fragment at the abductor compartment. No detectable sacral ala fracture. No sacroiliac or symphysis pubis diastasis. Both hips are located. There is soft tissue stranding about the fractures. The muscles of the right abductor compartment are expanded and there is a wispy high-density that is partially covered within abductor musculature, cannot exclude injured vessel or focus of active bleeding. IMPRESSION: 1. Nondisplaced right inferior and superior pubic ramus fractures. 2. Strain of right abductor compartment with possible focus of active intramuscular hemorrhage (wispy high-density seen on the lowest slice is incompletely covered). Please observe for progressive swelling in the right thigh. 3. 3.1 cm abdominal aortic aneurysm with mild increase from 2017 (when it was 2.8 cm). 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 Electronically Signed   By: Monte Fantasia M.D.   On: 10/03/2018 15:48   Dg Hip Unilat  With Pelvis 2-3 Views Right  Result Date: 10/03/2018 CLINICAL DATA:  Right hip pain post fall. EXAM: DG HIP (WITH OR WITHOUT PELVIS) 2-3V RIGHT COMPARISON:  None. FINDINGS: There is no evidence of right hip fracture or dislocation. Osteopenia. Age-indeterminate fracture of the right superior pubic ramus. Questionable healed fracture of the inferior right pubic ramus. The left pubic bones are incompletely evaluated. Internal rotation of the left hip, which is also incompletely evaluated. IMPRESSION: No evidence of right hip fracture. Age-indeterminate fracture of the right superior pubic ramus. Questionable healed fracture of the inferior right pubic ramus. Incomplete visualized left pelvic bones, and left hip, which is internally rotated. Please  correlate to possible point of tenderness. Electronically Signed   By: Fidela Salisbury M.D.   On: 10/03/2018 14:02   Dg Femur Min 2 Views Right  Result Date: 10/03/2018 CLINICAL DATA:  Right hip pain after fall. EXAM: RIGHT FEMUR 2 VIEWS COMPARISON:  None. FINDINGS: There is no evidence of fracture or other focal bone lesions. Soft tissues are unremarkable. IMPRESSION: Negative. Electronically Signed   By: Marijo Conception, M.D.   On: 10/03/2018 14:00    EKG:   Orders placed or performed during the hospital encounter of 10/03/18  . EKG 12-Lead  . EKG 12-Lead      Management plans discussed with the patient, family and  they are in agreement.  CODE STATUS:     Code Status Orders  (From admission, onward)         Start     Ordered   10/03/18 1613  Do not attempt resuscitation (DNR)  Continuous    Question Answer Comment  In the event of cardiac or respiratory ARREST Do not call a "code blue"   In the event of cardiac or respiratory ARREST Do not perform Intubation, CPR, defibrillation or ACLS   In the event of cardiac or respiratory ARREST Use medication by any route, position, wound care, and other measures to relive pain and suffering. May use oxygen, suction and manual treatment of airway obstruction as needed for comfort.      10/03/18 1612        Code Status History    Date Active Date Inactive Code Status Order ID Comments User Context   04/12/2018 1253 04/12/2018 1756 Full Code [id number]  Algernon Huxley, MD Inpatient   04/02/2018 1532 04/06/2018 2023 DNR [id number]  Nicholes Mango, MD Inpatient   04/02/2018 0353 04/02/2018 1531 Full Code [id number]  Harrie Foreman, MD Inpatient   03/04/2018 1221 03/04/2018 1859 Full Code [id number]  Algernon Huxley, MD Inpatient   12/16/2017 1457 12/21/2017 1906 Full Code [id number]  Algernon Huxley, MD Inpatient   11/30/2017 1022 11/30/2017 1714 Full Code [id number]  Algernon Huxley, MD Inpatient   11/16/2017 1209 11/16/2017 1857 Full Code [id number]  Algernon Huxley, MD Inpatient   08/07/2015 0010 08/09/2015 2030 Full Code [id number]  Lance Coon, MD Inpatient    Advance Directive Documentation     Most Recent Value  Type of Advance Directive  Healthcare Power of Attorney  Pre-existing out of facility DNR order (yellow form or pink MOST form)  -  "MOST" Form in Place?  -      TOTAL TIME TAKING CARE OF THIS PATIENT: 45  minutes.   Note: This dictation was prepared with Dragon dictation along with smaller phrase technology. Any transcriptional errors that result from this process are unintentional.   @[facility] @  on 10/07/2018 at 11:22 AM  Between 7am to 6pm - Pager - [phone]  After 6pm go to [email or web address] - password [email or web address] [facility]  [place] [facility]  Office  [phone]  CC: Primary care physician; Albina Billet, MD

## 2018-10-07 NOTE — Plan of Care (Signed)
  Problem: Education: Goal: Knowledge of General Education information will improve Description Including pain rating scale, medication(s)/side effects and non-pharmacologic comfort measures Outcome: Progressing   

## 2018-10-07 NOTE — Progress Notes (Signed)
Physical Therapy Treatment Patient Details Name: Devin Washington MRN: [id number] DOB: 1923/05/07 Today's Date: 10/07/2018    History of Present Illness Pt is a 84 y.o. male with a known history of cardiomyopathy, congestive heart failure, hiatal hernia, hyperlipidemia, hypertension, peripheral arterial disease, pacemaker, prostate cancer presented to the emergency room for fall.  Patient wanted to go to the restroom but power was out and fell while trying to walk in the dark.  Patient was worked up with x-ray of the pelvis which showed a right pubic ramus fracture.  Was also worked up with CT pelvis which showed strain in the right abductor muscle compartment with intramuscular hemorrhage.  Advised monitoring.  Ortho consult completed with pt [facility]AT.      PT Comments    Pt ready for session.  Comfortable at rest.  .Participated in exercises as described below.  Pt able to stand with min a x 1.  Generally unsteady with some forward lean.  After gaining balance, he was able to walk 12' today with slow and generally unsteady gait.  Limited by pain and fatigue.  +1 assist for recliner follow used for pt and staff safety.   Follow Up Recommendations  SNF     Equipment Recommendations       Recommendations for Other Services       Precautions / Restrictions Precautions Precautions: Fall Restrictions Weight Bearing Restrictions: Yes RLE Weight Bearing: Weight bearing as tolerated    Mobility  Bed Mobility               General bed mobility comments: in recliner  Transfers Overall transfer level: Needs assistance Equipment used: Rolling walker (2 wheeled) Transfers: Sit to/from Stand Sit to Stand: Min assist            Ambulation/Gait Ambulation/Gait assistance: Min assist Gait Distance (Feet): 12 Feet Assistive device: Rolling walker (2 wheeled) Gait Pattern/deviations: Step-to pattern Gait velocity: Decreased   General Gait Details: generally unsteady, limited by  fatigue and pain   Stairs             Wheelchair Mobility    Modified Rankin (Stroke Patients Only)       Balance Overall balance assessment: Needs assistance Sitting-balance support: Feet supported;Bilateral upper extremity supported Sitting balance-Leahy Scale: Fair     Standing balance support: Bilateral upper extremity supported Standing balance-Leahy Scale: Fair                              Cognition Arousal/Alertness: Awake/alert Behavior During Therapy: WFL for tasks assessed/performed Overall Cognitive Status: Within Functional Limits for tasks assessed                                        Exercises Other Exercises Other Exercises: seated AROM ankle pumps, LAQ, marches, ab/add x 10    General Comments        Pertinent Vitals/Pain Pain Assessment: Faces Faces Pain Scale: Hurts little more Pain Location: R hip/LE Pain Descriptors / Indicators: Discomfort;Sore Pain Intervention(s): Limited activity within patient's tolerance;Monitored during session    Home Living                      Prior Function            PT Goals (current goals can now be found in the care plan section)  Progress towards PT goals: Progressing toward goals    Frequency    7X/week      PT Plan Current plan remains appropriate    Co-evaluation              AM-PAC PT "6 Clicks" Daily Activity  Outcome Measure  Difficulty turning over in bed (including adjusting bedclothes, sheets and blankets)?: Unable Difficulty moving from lying on back to sitting on the side of the bed? : Unable Difficulty sitting down on and standing up from a chair with arms (e.g., wheelchair, bedside commode, etc,.)?: Unable Help needed moving to and from a bed to chair (including a wheelchair)?: A Little Help needed walking in hospital room?: A Little Help needed climbing 3-5 steps with a railing? : Total 6 Click Score: 10    End of Session  Equipment Utilized During Treatment: Gait belt Activity Tolerance: Patient limited by pain;Patient limited by fatigue Patient left: in chair;with call bell/phone within reach;with chair alarm set         Time: 0953-1005 PT Time Calculation (min) (ACUTE ONLY): 12 min  Charges:  $Gait Training: 8-22 mins                    Chesley Noon, PTA 10/07/18, 10:15 AM

## 2018-10-07 NOTE — Clinical Social Work Placement (Signed)
   CLINICAL SOCIAL WORK PLACEMENT  NOTE  Date:  10/07/2018  Patient Details  Name: Devin Washington MRN: [id number] Date of Birth: 30-Dec-1922  Clinical Social Work is seeking post-discharge placement for this patient at the [place] level of care (*CSW will initial, date and re-position this form in  chart as items are completed):  Yes   Patient/family provided with [facility] Work Department's list of facilities offering this level of care within the geographic area requested by the patient (or if unable, by the patient's family).  Yes   Patient/family informed of their freedom to choose among providers that offer the needed level of care, that participate in Medicare, Medicaid or managed care program needed by the patient, have an available bed and are willing to accept the patient.  Yes   Patient/family informed of [facility]'s ownership interest in [facility] and [facility], as well as of the fact that they are under no obligation to receive care at these facilities.  PASRR submitted to EDS on 10/04/18     PASRR number received on 10/04/18     Existing PASRR number confirmed on       FL2 transmitted to all facilities in geographic area requested by pt/family on 10/04/18     FL2 transmitted to all facilities within larger geographic area on       Patient informed that his/her managed care company has contracts with or will negotiate with certain facilities, including the following:        Yes   Patient/family informed of bed offers received.  Patient chooses bed at [facility] )     Physician recommends and patient chooses bed at      Patient to be transferred to [facility] ) on 10/07/18.  Patient to be transferred to facility by [facility] )     Patient family notified on 10/07/18 of transfer.  Name of family member notified:  (Patient's granddaughter Kenney Houseman is aware of D/C today. )     PHYSICIAN        Additional Comment:    _______________________________________________ Ziyan Hillmer, Veronia Beets, LCSW 10/07/2018, 12:01 PM

## 2018-10-07 NOTE — Progress Notes (Addendum)
Patient is being discharged to [facility]. A&O x 4. Report called to [facility]. Signed DNR and script sent in packet. Waiting on EMS at this time. EMS here to transport patient. IV removed with cath intact.

## 2018-10-08 DIAGNOSIS — Z993 Dependence on wheelchair: Secondary | ICD-10-CM | POA: Diagnosis not present

## 2018-10-08 DIAGNOSIS — M8000XD Age-related osteoporosis with current pathological fracture, unspecified site, subsequent encounter for fracture with routine healing: Secondary | ICD-10-CM | POA: Diagnosis not present

## 2018-10-08 DIAGNOSIS — I4891 Unspecified atrial fibrillation: Secondary | ICD-10-CM | POA: Diagnosis not present

## 2018-10-08 DIAGNOSIS — I5022 Chronic systolic (congestive) heart failure: Secondary | ICD-10-CM | POA: Diagnosis not present

## 2018-10-08 DIAGNOSIS — N183 Chronic kidney disease, stage 3 (moderate): Secondary | ICD-10-CM | POA: Diagnosis not present

## 2018-10-08 DIAGNOSIS — I495 Sick sinus syndrome: Secondary | ICD-10-CM | POA: Diagnosis not present

## 2018-10-19 DIAGNOSIS — S329XXA Fracture of unspecified parts of lumbosacral spine and pelvis, initial encounter for closed fracture: Secondary | ICD-10-CM | POA: Diagnosis not present

## 2018-10-29 DIAGNOSIS — I739 Peripheral vascular disease, unspecified: Secondary | ICD-10-CM | POA: Diagnosis not present

## 2018-10-29 DIAGNOSIS — I4891 Unspecified atrial fibrillation: Secondary | ICD-10-CM | POA: Diagnosis not present

## 2018-10-29 DIAGNOSIS — M810 Age-related osteoporosis without current pathological fracture: Secondary | ICD-10-CM | POA: Diagnosis not present

## 2018-10-29 DIAGNOSIS — S32501D Unspecified fracture of right pubis, subsequent encounter for fracture with routine healing: Secondary | ICD-10-CM | POA: Diagnosis not present

## 2018-10-29 DIAGNOSIS — I495 Sick sinus syndrome: Secondary | ICD-10-CM | POA: Diagnosis not present

## 2018-10-29 DIAGNOSIS — I5022 Chronic systolic (congestive) heart failure: Secondary | ICD-10-CM | POA: Diagnosis not present

## 2018-10-29 DIAGNOSIS — I429 Cardiomyopathy, unspecified: Secondary | ICD-10-CM | POA: Diagnosis not present

## 2018-10-29 DIAGNOSIS — I130 Hypertensive heart and chronic kidney disease with heart failure and stage 1 through stage 4 chronic kidney disease, or unspecified chronic kidney disease: Secondary | ICD-10-CM | POA: Diagnosis not present

## 2018-10-29 DIAGNOSIS — D509 Iron deficiency anemia, unspecified: Secondary | ICD-10-CM | POA: Diagnosis not present

## 2018-10-29 DIAGNOSIS — N183 Chronic kidney disease, stage 3 (moderate): Secondary | ICD-10-CM | POA: Diagnosis not present

## 2018-11-01 DIAGNOSIS — S32501D Unspecified fracture of right pubis, subsequent encounter for fracture with routine healing: Secondary | ICD-10-CM | POA: Diagnosis not present

## 2018-11-01 DIAGNOSIS — M810 Age-related osteoporosis without current pathological fracture: Secondary | ICD-10-CM | POA: Diagnosis not present

## 2018-11-01 DIAGNOSIS — N183 Chronic kidney disease, stage 3 (moderate): Secondary | ICD-10-CM | POA: Diagnosis not present

## 2018-11-01 DIAGNOSIS — D509 Iron deficiency anemia, unspecified: Secondary | ICD-10-CM | POA: Diagnosis not present

## 2018-11-01 DIAGNOSIS — I495 Sick sinus syndrome: Secondary | ICD-10-CM | POA: Diagnosis not present

## 2018-11-01 DIAGNOSIS — I739 Peripheral vascular disease, unspecified: Secondary | ICD-10-CM | POA: Diagnosis not present

## 2018-11-01 DIAGNOSIS — I429 Cardiomyopathy, unspecified: Secondary | ICD-10-CM | POA: Diagnosis not present

## 2018-11-01 DIAGNOSIS — I4891 Unspecified atrial fibrillation: Secondary | ICD-10-CM | POA: Diagnosis not present

## 2018-11-01 DIAGNOSIS — I130 Hypertensive heart and chronic kidney disease with heart failure and stage 1 through stage 4 chronic kidney disease, or unspecified chronic kidney disease: Secondary | ICD-10-CM | POA: Diagnosis not present

## 2018-11-01 DIAGNOSIS — I5022 Chronic systolic (congestive) heart failure: Secondary | ICD-10-CM | POA: Diagnosis not present

## 2018-11-02 DIAGNOSIS — I5022 Chronic systolic (congestive) heart failure: Secondary | ICD-10-CM | POA: Diagnosis not present

## 2018-11-02 DIAGNOSIS — I4891 Unspecified atrial fibrillation: Secondary | ICD-10-CM | POA: Diagnosis not present

## 2018-11-02 DIAGNOSIS — I495 Sick sinus syndrome: Secondary | ICD-10-CM | POA: Diagnosis not present

## 2018-11-02 DIAGNOSIS — N183 Chronic kidney disease, stage 3 (moderate): Secondary | ICD-10-CM | POA: Diagnosis not present

## 2018-11-02 DIAGNOSIS — I429 Cardiomyopathy, unspecified: Secondary | ICD-10-CM | POA: Diagnosis not present

## 2018-11-02 DIAGNOSIS — I739 Peripheral vascular disease, unspecified: Secondary | ICD-10-CM | POA: Diagnosis not present

## 2018-11-02 DIAGNOSIS — S32501D Unspecified fracture of right pubis, subsequent encounter for fracture with routine healing: Secondary | ICD-10-CM | POA: Diagnosis not present

## 2018-11-02 DIAGNOSIS — D509 Iron deficiency anemia, unspecified: Secondary | ICD-10-CM | POA: Diagnosis not present

## 2018-11-02 DIAGNOSIS — I130 Hypertensive heart and chronic kidney disease with heart failure and stage 1 through stage 4 chronic kidney disease, or unspecified chronic kidney disease: Secondary | ICD-10-CM | POA: Diagnosis not present

## 2018-11-02 DIAGNOSIS — M810 Age-related osteoporosis without current pathological fracture: Secondary | ICD-10-CM | POA: Diagnosis not present

## 2018-11-05 DIAGNOSIS — I10 Essential (primary) hypertension: Secondary | ICD-10-CM | POA: Diagnosis not present

## 2018-11-05 DIAGNOSIS — I130 Hypertensive heart and chronic kidney disease with heart failure and stage 1 through stage 4 chronic kidney disease, or unspecified chronic kidney disease: Secondary | ICD-10-CM | POA: Diagnosis not present

## 2018-11-05 DIAGNOSIS — D509 Iron deficiency anemia, unspecified: Secondary | ICD-10-CM | POA: Diagnosis not present

## 2018-11-05 DIAGNOSIS — M810 Age-related osteoporosis without current pathological fracture: Secondary | ICD-10-CM | POA: Diagnosis not present

## 2018-11-05 DIAGNOSIS — N183 Chronic kidney disease, stage 3 (moderate): Secondary | ICD-10-CM | POA: Diagnosis not present

## 2018-11-05 DIAGNOSIS — S32501D Unspecified fracture of right pubis, subsequent encounter for fracture with routine healing: Secondary | ICD-10-CM | POA: Diagnosis not present

## 2018-11-05 DIAGNOSIS — I739 Peripheral vascular disease, unspecified: Secondary | ICD-10-CM | POA: Diagnosis not present

## 2018-11-05 DIAGNOSIS — I4891 Unspecified atrial fibrillation: Secondary | ICD-10-CM | POA: Diagnosis not present

## 2018-11-05 DIAGNOSIS — I495 Sick sinus syndrome: Secondary | ICD-10-CM | POA: Diagnosis not present

## 2018-11-05 DIAGNOSIS — I5022 Chronic systolic (congestive) heart failure: Secondary | ICD-10-CM | POA: Diagnosis not present

## 2018-11-05 DIAGNOSIS — I429 Cardiomyopathy, unspecified: Secondary | ICD-10-CM | POA: Diagnosis not present

## 2018-11-08 ENCOUNTER — Encounter (INDEPENDENT_AMBULATORY_CARE_PROVIDER_SITE_OTHER): Payer: Medicare HMO

## 2018-11-08 ENCOUNTER — Ambulatory Visit (INDEPENDENT_AMBULATORY_CARE_PROVIDER_SITE_OTHER): Payer: Medicare HMO | Admitting: Nurse Practitioner

## 2018-11-08 DIAGNOSIS — D509 Iron deficiency anemia, unspecified: Secondary | ICD-10-CM | POA: Diagnosis not present

## 2018-11-08 DIAGNOSIS — I4891 Unspecified atrial fibrillation: Secondary | ICD-10-CM | POA: Diagnosis not present

## 2018-11-08 DIAGNOSIS — I130 Hypertensive heart and chronic kidney disease with heart failure and stage 1 through stage 4 chronic kidney disease, or unspecified chronic kidney disease: Secondary | ICD-10-CM | POA: Diagnosis not present

## 2018-11-08 DIAGNOSIS — I739 Peripheral vascular disease, unspecified: Secondary | ICD-10-CM | POA: Diagnosis not present

## 2018-11-08 DIAGNOSIS — I5022 Chronic systolic (congestive) heart failure: Secondary | ICD-10-CM | POA: Diagnosis not present

## 2018-11-08 DIAGNOSIS — N183 Chronic kidney disease, stage 3 (moderate): Secondary | ICD-10-CM | POA: Diagnosis not present

## 2018-11-08 DIAGNOSIS — M810 Age-related osteoporosis without current pathological fracture: Secondary | ICD-10-CM | POA: Diagnosis not present

## 2018-11-08 DIAGNOSIS — I495 Sick sinus syndrome: Secondary | ICD-10-CM | POA: Diagnosis not present

## 2018-11-08 DIAGNOSIS — I429 Cardiomyopathy, unspecified: Secondary | ICD-10-CM | POA: Diagnosis not present

## 2018-11-08 DIAGNOSIS — S32501D Unspecified fracture of right pubis, subsequent encounter for fracture with routine healing: Secondary | ICD-10-CM | POA: Diagnosis not present

## 2018-11-09 DIAGNOSIS — M810 Age-related osteoporosis without current pathological fracture: Secondary | ICD-10-CM | POA: Diagnosis not present

## 2018-11-09 DIAGNOSIS — I5022 Chronic systolic (congestive) heart failure: Secondary | ICD-10-CM | POA: Diagnosis not present

## 2018-11-09 DIAGNOSIS — S32501D Unspecified fracture of right pubis, subsequent encounter for fracture with routine healing: Secondary | ICD-10-CM | POA: Diagnosis not present

## 2018-11-09 DIAGNOSIS — D509 Iron deficiency anemia, unspecified: Secondary | ICD-10-CM | POA: Diagnosis not present

## 2018-11-09 DIAGNOSIS — N183 Chronic kidney disease, stage 3 (moderate): Secondary | ICD-10-CM | POA: Diagnosis not present

## 2018-11-09 DIAGNOSIS — I429 Cardiomyopathy, unspecified: Secondary | ICD-10-CM | POA: Diagnosis not present

## 2018-11-09 DIAGNOSIS — I4891 Unspecified atrial fibrillation: Secondary | ICD-10-CM | POA: Diagnosis not present

## 2018-11-09 DIAGNOSIS — I495 Sick sinus syndrome: Secondary | ICD-10-CM | POA: Diagnosis not present

## 2018-11-09 DIAGNOSIS — I739 Peripheral vascular disease, unspecified: Secondary | ICD-10-CM | POA: Diagnosis not present

## 2018-11-09 DIAGNOSIS — I130 Hypertensive heart and chronic kidney disease with heart failure and stage 1 through stage 4 chronic kidney disease, or unspecified chronic kidney disease: Secondary | ICD-10-CM | POA: Diagnosis not present

## 2018-11-10 DIAGNOSIS — I4891 Unspecified atrial fibrillation: Secondary | ICD-10-CM | POA: Diagnosis not present

## 2018-11-10 DIAGNOSIS — M810 Age-related osteoporosis without current pathological fracture: Secondary | ICD-10-CM | POA: Diagnosis not present

## 2018-11-10 DIAGNOSIS — I495 Sick sinus syndrome: Secondary | ICD-10-CM | POA: Diagnosis not present

## 2018-11-10 DIAGNOSIS — I5022 Chronic systolic (congestive) heart failure: Secondary | ICD-10-CM | POA: Diagnosis not present

## 2018-11-10 DIAGNOSIS — I739 Peripheral vascular disease, unspecified: Secondary | ICD-10-CM | POA: Diagnosis not present

## 2018-11-10 DIAGNOSIS — D509 Iron deficiency anemia, unspecified: Secondary | ICD-10-CM | POA: Diagnosis not present

## 2018-11-10 DIAGNOSIS — N183 Chronic kidney disease, stage 3 (moderate): Secondary | ICD-10-CM | POA: Diagnosis not present

## 2018-11-10 DIAGNOSIS — I130 Hypertensive heart and chronic kidney disease with heart failure and stage 1 through stage 4 chronic kidney disease, or unspecified chronic kidney disease: Secondary | ICD-10-CM | POA: Diagnosis not present

## 2018-11-10 DIAGNOSIS — I429 Cardiomyopathy, unspecified: Secondary | ICD-10-CM | POA: Diagnosis not present

## 2018-11-10 DIAGNOSIS — S32501D Unspecified fracture of right pubis, subsequent encounter for fracture with routine healing: Secondary | ICD-10-CM | POA: Diagnosis not present

## 2018-11-12 DIAGNOSIS — M810 Age-related osteoporosis without current pathological fracture: Secondary | ICD-10-CM | POA: Diagnosis not present

## 2018-11-12 DIAGNOSIS — I4891 Unspecified atrial fibrillation: Secondary | ICD-10-CM | POA: Diagnosis not present

## 2018-11-12 DIAGNOSIS — I739 Peripheral vascular disease, unspecified: Secondary | ICD-10-CM | POA: Diagnosis not present

## 2018-11-12 DIAGNOSIS — N183 Chronic kidney disease, stage 3 (moderate): Secondary | ICD-10-CM | POA: Diagnosis not present

## 2018-11-12 DIAGNOSIS — I429 Cardiomyopathy, unspecified: Secondary | ICD-10-CM | POA: Diagnosis not present

## 2018-11-12 DIAGNOSIS — I495 Sick sinus syndrome: Secondary | ICD-10-CM | POA: Diagnosis not present

## 2018-11-12 DIAGNOSIS — S32501D Unspecified fracture of right pubis, subsequent encounter for fracture with routine healing: Secondary | ICD-10-CM | POA: Diagnosis not present

## 2018-11-12 DIAGNOSIS — I130 Hypertensive heart and chronic kidney disease with heart failure and stage 1 through stage 4 chronic kidney disease, or unspecified chronic kidney disease: Secondary | ICD-10-CM | POA: Diagnosis not present

## 2018-11-12 DIAGNOSIS — I5022 Chronic systolic (congestive) heart failure: Secondary | ICD-10-CM | POA: Diagnosis not present

## 2018-11-12 DIAGNOSIS — D509 Iron deficiency anemia, unspecified: Secondary | ICD-10-CM | POA: Diagnosis not present

## 2018-11-15 DIAGNOSIS — I5022 Chronic systolic (congestive) heart failure: Secondary | ICD-10-CM | POA: Diagnosis not present

## 2018-11-15 DIAGNOSIS — I739 Peripheral vascular disease, unspecified: Secondary | ICD-10-CM | POA: Diagnosis not present

## 2018-11-15 DIAGNOSIS — I4891 Unspecified atrial fibrillation: Secondary | ICD-10-CM | POA: Diagnosis not present

## 2018-11-15 DIAGNOSIS — I429 Cardiomyopathy, unspecified: Secondary | ICD-10-CM | POA: Diagnosis not present

## 2018-11-15 DIAGNOSIS — N183 Chronic kidney disease, stage 3 (moderate): Secondary | ICD-10-CM | POA: Diagnosis not present

## 2018-11-15 DIAGNOSIS — M810 Age-related osteoporosis without current pathological fracture: Secondary | ICD-10-CM | POA: Diagnosis not present

## 2018-11-15 DIAGNOSIS — S32501D Unspecified fracture of right pubis, subsequent encounter for fracture with routine healing: Secondary | ICD-10-CM | POA: Diagnosis not present

## 2018-11-15 DIAGNOSIS — I495 Sick sinus syndrome: Secondary | ICD-10-CM | POA: Diagnosis not present

## 2018-11-15 DIAGNOSIS — D509 Iron deficiency anemia, unspecified: Secondary | ICD-10-CM | POA: Diagnosis not present

## 2018-11-15 DIAGNOSIS — I130 Hypertensive heart and chronic kidney disease with heart failure and stage 1 through stage 4 chronic kidney disease, or unspecified chronic kidney disease: Secondary | ICD-10-CM | POA: Diagnosis not present

## 2018-11-16 DIAGNOSIS — I4891 Unspecified atrial fibrillation: Secondary | ICD-10-CM | POA: Diagnosis not present

## 2018-11-16 DIAGNOSIS — I5022 Chronic systolic (congestive) heart failure: Secondary | ICD-10-CM | POA: Diagnosis not present

## 2018-11-16 DIAGNOSIS — S32501D Unspecified fracture of right pubis, subsequent encounter for fracture with routine healing: Secondary | ICD-10-CM | POA: Diagnosis not present

## 2018-11-16 DIAGNOSIS — I495 Sick sinus syndrome: Secondary | ICD-10-CM | POA: Diagnosis not present

## 2018-11-16 DIAGNOSIS — D509 Iron deficiency anemia, unspecified: Secondary | ICD-10-CM | POA: Diagnosis not present

## 2018-11-16 DIAGNOSIS — I739 Peripheral vascular disease, unspecified: Secondary | ICD-10-CM | POA: Diagnosis not present

## 2018-11-16 DIAGNOSIS — M810 Age-related osteoporosis without current pathological fracture: Secondary | ICD-10-CM | POA: Diagnosis not present

## 2018-11-16 DIAGNOSIS — I130 Hypertensive heart and chronic kidney disease with heart failure and stage 1 through stage 4 chronic kidney disease, or unspecified chronic kidney disease: Secondary | ICD-10-CM | POA: Diagnosis not present

## 2018-11-16 DIAGNOSIS — I429 Cardiomyopathy, unspecified: Secondary | ICD-10-CM | POA: Diagnosis not present

## 2018-11-16 DIAGNOSIS — N183 Chronic kidney disease, stage 3 (moderate): Secondary | ICD-10-CM | POA: Diagnosis not present

## 2018-11-17 DIAGNOSIS — I5022 Chronic systolic (congestive) heart failure: Secondary | ICD-10-CM | POA: Diagnosis not present

## 2018-11-17 DIAGNOSIS — I4891 Unspecified atrial fibrillation: Secondary | ICD-10-CM | POA: Diagnosis not present

## 2018-11-17 DIAGNOSIS — N183 Chronic kidney disease, stage 3 (moderate): Secondary | ICD-10-CM | POA: Diagnosis not present

## 2018-11-17 DIAGNOSIS — S32501D Unspecified fracture of right pubis, subsequent encounter for fracture with routine healing: Secondary | ICD-10-CM | POA: Diagnosis not present

## 2018-11-17 DIAGNOSIS — I130 Hypertensive heart and chronic kidney disease with heart failure and stage 1 through stage 4 chronic kidney disease, or unspecified chronic kidney disease: Secondary | ICD-10-CM | POA: Diagnosis not present

## 2018-11-17 DIAGNOSIS — I429 Cardiomyopathy, unspecified: Secondary | ICD-10-CM | POA: Diagnosis not present

## 2018-11-17 DIAGNOSIS — I739 Peripheral vascular disease, unspecified: Secondary | ICD-10-CM | POA: Diagnosis not present

## 2018-11-17 DIAGNOSIS — I495 Sick sinus syndrome: Secondary | ICD-10-CM | POA: Diagnosis not present

## 2018-11-17 DIAGNOSIS — D509 Iron deficiency anemia, unspecified: Secondary | ICD-10-CM | POA: Diagnosis not present

## 2018-11-17 DIAGNOSIS — M810 Age-related osteoporosis without current pathological fracture: Secondary | ICD-10-CM | POA: Diagnosis not present

## 2018-11-19 DIAGNOSIS — I130 Hypertensive heart and chronic kidney disease with heart failure and stage 1 through stage 4 chronic kidney disease, or unspecified chronic kidney disease: Secondary | ICD-10-CM | POA: Diagnosis not present

## 2018-11-19 DIAGNOSIS — D509 Iron deficiency anemia, unspecified: Secondary | ICD-10-CM | POA: Diagnosis not present

## 2018-11-19 DIAGNOSIS — S32501D Unspecified fracture of right pubis, subsequent encounter for fracture with routine healing: Secondary | ICD-10-CM | POA: Diagnosis not present

## 2018-11-19 DIAGNOSIS — N183 Chronic kidney disease, stage 3 (moderate): Secondary | ICD-10-CM | POA: Diagnosis not present

## 2018-11-19 DIAGNOSIS — M810 Age-related osteoporosis without current pathological fracture: Secondary | ICD-10-CM | POA: Diagnosis not present

## 2018-11-19 DIAGNOSIS — I429 Cardiomyopathy, unspecified: Secondary | ICD-10-CM | POA: Diagnosis not present

## 2018-11-19 DIAGNOSIS — I10 Essential (primary) hypertension: Secondary | ICD-10-CM | POA: Diagnosis not present

## 2018-11-19 DIAGNOSIS — I739 Peripheral vascular disease, unspecified: Secondary | ICD-10-CM | POA: Diagnosis not present

## 2018-11-19 DIAGNOSIS — I495 Sick sinus syndrome: Secondary | ICD-10-CM | POA: Diagnosis not present

## 2018-11-19 DIAGNOSIS — I4891 Unspecified atrial fibrillation: Secondary | ICD-10-CM | POA: Diagnosis not present

## 2018-11-19 DIAGNOSIS — I5022 Chronic systolic (congestive) heart failure: Secondary | ICD-10-CM | POA: Diagnosis not present

## 2018-11-19 DIAGNOSIS — N189 Chronic kidney disease, unspecified: Secondary | ICD-10-CM | POA: Diagnosis not present

## 2018-11-21 DIAGNOSIS — S32501D Unspecified fracture of right pubis, subsequent encounter for fracture with routine healing: Secondary | ICD-10-CM | POA: Diagnosis not present

## 2018-11-21 DIAGNOSIS — I130 Hypertensive heart and chronic kidney disease with heart failure and stage 1 through stage 4 chronic kidney disease, or unspecified chronic kidney disease: Secondary | ICD-10-CM | POA: Diagnosis not present

## 2018-11-21 DIAGNOSIS — I5022 Chronic systolic (congestive) heart failure: Secondary | ICD-10-CM | POA: Diagnosis not present

## 2018-11-21 DIAGNOSIS — D509 Iron deficiency anemia, unspecified: Secondary | ICD-10-CM | POA: Diagnosis not present

## 2018-11-21 DIAGNOSIS — I495 Sick sinus syndrome: Secondary | ICD-10-CM | POA: Diagnosis not present

## 2018-11-21 DIAGNOSIS — I429 Cardiomyopathy, unspecified: Secondary | ICD-10-CM | POA: Diagnosis not present

## 2018-11-21 DIAGNOSIS — N183 Chronic kidney disease, stage 3 (moderate): Secondary | ICD-10-CM | POA: Diagnosis not present

## 2018-11-21 DIAGNOSIS — I4891 Unspecified atrial fibrillation: Secondary | ICD-10-CM | POA: Diagnosis not present

## 2018-11-21 DIAGNOSIS — M810 Age-related osteoporosis without current pathological fracture: Secondary | ICD-10-CM | POA: Diagnosis not present

## 2018-11-21 DIAGNOSIS — I739 Peripheral vascular disease, unspecified: Secondary | ICD-10-CM | POA: Diagnosis not present

## 2018-11-22 DIAGNOSIS — S32501D Unspecified fracture of right pubis, subsequent encounter for fracture with routine healing: Secondary | ICD-10-CM | POA: Diagnosis not present

## 2018-11-22 DIAGNOSIS — M810 Age-related osteoporosis without current pathological fracture: Secondary | ICD-10-CM | POA: Diagnosis not present

## 2018-11-22 DIAGNOSIS — I739 Peripheral vascular disease, unspecified: Secondary | ICD-10-CM | POA: Diagnosis not present

## 2018-11-22 DIAGNOSIS — N183 Chronic kidney disease, stage 3 (moderate): Secondary | ICD-10-CM | POA: Diagnosis not present

## 2018-11-22 DIAGNOSIS — D509 Iron deficiency anemia, unspecified: Secondary | ICD-10-CM | POA: Diagnosis not present

## 2018-11-22 DIAGNOSIS — I130 Hypertensive heart and chronic kidney disease with heart failure and stage 1 through stage 4 chronic kidney disease, or unspecified chronic kidney disease: Secondary | ICD-10-CM | POA: Diagnosis not present

## 2018-11-22 DIAGNOSIS — I4891 Unspecified atrial fibrillation: Secondary | ICD-10-CM | POA: Diagnosis not present

## 2018-11-22 DIAGNOSIS — I5022 Chronic systolic (congestive) heart failure: Secondary | ICD-10-CM | POA: Diagnosis not present

## 2018-11-22 DIAGNOSIS — I495 Sick sinus syndrome: Secondary | ICD-10-CM | POA: Diagnosis not present

## 2018-11-22 DIAGNOSIS — I429 Cardiomyopathy, unspecified: Secondary | ICD-10-CM | POA: Diagnosis not present

## 2018-11-23 DIAGNOSIS — I480 Paroxysmal atrial fibrillation: Secondary | ICD-10-CM | POA: Diagnosis not present

## 2018-11-23 DIAGNOSIS — I5022 Chronic systolic (congestive) heart failure: Secondary | ICD-10-CM | POA: Diagnosis not present

## 2018-11-23 DIAGNOSIS — N183 Chronic kidney disease, stage 3 (moderate): Secondary | ICD-10-CM | POA: Diagnosis not present

## 2018-11-23 DIAGNOSIS — R0602 Shortness of breath: Secondary | ICD-10-CM | POA: Diagnosis not present

## 2018-11-23 DIAGNOSIS — E7849 Other hyperlipidemia: Secondary | ICD-10-CM | POA: Diagnosis not present

## 2018-11-23 DIAGNOSIS — I495 Sick sinus syndrome: Secondary | ICD-10-CM | POA: Diagnosis not present

## 2018-11-23 DIAGNOSIS — M810 Age-related osteoporosis without current pathological fracture: Secondary | ICD-10-CM | POA: Diagnosis not present

## 2018-11-23 DIAGNOSIS — R011 Cardiac murmur, unspecified: Secondary | ICD-10-CM | POA: Diagnosis not present

## 2018-11-23 DIAGNOSIS — I5032 Chronic diastolic (congestive) heart failure: Secondary | ICD-10-CM | POA: Diagnosis not present

## 2018-11-23 DIAGNOSIS — I723 Aneurysm of iliac artery: Secondary | ICD-10-CM | POA: Diagnosis not present

## 2018-11-23 DIAGNOSIS — I10 Essential (primary) hypertension: Secondary | ICD-10-CM | POA: Diagnosis not present

## 2018-11-23 DIAGNOSIS — D509 Iron deficiency anemia, unspecified: Secondary | ICD-10-CM | POA: Diagnosis not present

## 2018-11-23 DIAGNOSIS — I130 Hypertensive heart and chronic kidney disease with heart failure and stage 1 through stage 4 chronic kidney disease, or unspecified chronic kidney disease: Secondary | ICD-10-CM | POA: Diagnosis not present

## 2018-11-23 DIAGNOSIS — S32501D Unspecified fracture of right pubis, subsequent encounter for fracture with routine healing: Secondary | ICD-10-CM | POA: Diagnosis not present

## 2018-11-23 DIAGNOSIS — I429 Cardiomyopathy, unspecified: Secondary | ICD-10-CM | POA: Diagnosis not present

## 2018-11-23 DIAGNOSIS — I4891 Unspecified atrial fibrillation: Secondary | ICD-10-CM | POA: Diagnosis not present

## 2018-11-23 DIAGNOSIS — I739 Peripheral vascular disease, unspecified: Secondary | ICD-10-CM | POA: Diagnosis not present

## 2018-11-24 DIAGNOSIS — I429 Cardiomyopathy, unspecified: Secondary | ICD-10-CM | POA: Diagnosis not present

## 2018-11-24 DIAGNOSIS — I739 Peripheral vascular disease, unspecified: Secondary | ICD-10-CM | POA: Diagnosis not present

## 2018-11-24 DIAGNOSIS — N183 Chronic kidney disease, stage 3 (moderate): Secondary | ICD-10-CM | POA: Diagnosis not present

## 2018-11-24 DIAGNOSIS — I5022 Chronic systolic (congestive) heart failure: Secondary | ICD-10-CM | POA: Diagnosis not present

## 2018-11-24 DIAGNOSIS — I4891 Unspecified atrial fibrillation: Secondary | ICD-10-CM | POA: Diagnosis not present

## 2018-11-24 DIAGNOSIS — S32501D Unspecified fracture of right pubis, subsequent encounter for fracture with routine healing: Secondary | ICD-10-CM | POA: Diagnosis not present

## 2018-11-24 DIAGNOSIS — I130 Hypertensive heart and chronic kidney disease with heart failure and stage 1 through stage 4 chronic kidney disease, or unspecified chronic kidney disease: Secondary | ICD-10-CM | POA: Diagnosis not present

## 2018-11-24 DIAGNOSIS — M810 Age-related osteoporosis without current pathological fracture: Secondary | ICD-10-CM | POA: Diagnosis not present

## 2018-11-24 DIAGNOSIS — I495 Sick sinus syndrome: Secondary | ICD-10-CM | POA: Diagnosis not present

## 2018-11-24 DIAGNOSIS — D509 Iron deficiency anemia, unspecified: Secondary | ICD-10-CM | POA: Diagnosis not present

## 2018-11-25 DIAGNOSIS — M810 Age-related osteoporosis without current pathological fracture: Secondary | ICD-10-CM | POA: Diagnosis not present

## 2018-11-25 DIAGNOSIS — I130 Hypertensive heart and chronic kidney disease with heart failure and stage 1 through stage 4 chronic kidney disease, or unspecified chronic kidney disease: Secondary | ICD-10-CM | POA: Diagnosis not present

## 2018-11-25 DIAGNOSIS — S32501D Unspecified fracture of right pubis, subsequent encounter for fracture with routine healing: Secondary | ICD-10-CM | POA: Diagnosis not present

## 2018-11-25 DIAGNOSIS — I739 Peripheral vascular disease, unspecified: Secondary | ICD-10-CM | POA: Diagnosis not present

## 2018-11-25 DIAGNOSIS — I495 Sick sinus syndrome: Secondary | ICD-10-CM | POA: Diagnosis not present

## 2018-11-25 DIAGNOSIS — I4891 Unspecified atrial fibrillation: Secondary | ICD-10-CM | POA: Diagnosis not present

## 2018-11-25 DIAGNOSIS — I429 Cardiomyopathy, unspecified: Secondary | ICD-10-CM | POA: Diagnosis not present

## 2018-11-25 DIAGNOSIS — D509 Iron deficiency anemia, unspecified: Secondary | ICD-10-CM | POA: Diagnosis not present

## 2018-11-25 DIAGNOSIS — N183 Chronic kidney disease, stage 3 (moderate): Secondary | ICD-10-CM | POA: Diagnosis not present

## 2018-11-25 DIAGNOSIS — I5022 Chronic systolic (congestive) heart failure: Secondary | ICD-10-CM | POA: Diagnosis not present

## 2018-11-26 ENCOUNTER — Telehealth: Payer: Self-pay | Admitting: Oncology

## 2018-11-26 ENCOUNTER — Inpatient Hospital Stay: Payer: Medicare HMO

## 2018-11-26 DIAGNOSIS — I739 Peripheral vascular disease, unspecified: Secondary | ICD-10-CM | POA: Diagnosis not present

## 2018-11-26 DIAGNOSIS — R600 Localized edema: Secondary | ICD-10-CM | POA: Diagnosis not present

## 2018-11-26 DIAGNOSIS — J449 Chronic obstructive pulmonary disease, unspecified: Secondary | ICD-10-CM | POA: Diagnosis not present

## 2018-11-26 NOTE — Telephone Encounter (Signed)
Lab + Inj appt cancelled, per [facility]. Patient has broken hip recently and will c/b to [facility].

## 2018-11-29 DIAGNOSIS — I495 Sick sinus syndrome: Secondary | ICD-10-CM | POA: Diagnosis not present

## 2018-11-29 DIAGNOSIS — N183 Chronic kidney disease, stage 3 (moderate): Secondary | ICD-10-CM | POA: Diagnosis not present

## 2018-11-29 DIAGNOSIS — D509 Iron deficiency anemia, unspecified: Secondary | ICD-10-CM | POA: Diagnosis not present

## 2018-11-29 DIAGNOSIS — I429 Cardiomyopathy, unspecified: Secondary | ICD-10-CM | POA: Diagnosis not present

## 2018-11-29 DIAGNOSIS — I739 Peripheral vascular disease, unspecified: Secondary | ICD-10-CM | POA: Diagnosis not present

## 2018-11-29 DIAGNOSIS — S32501D Unspecified fracture of right pubis, subsequent encounter for fracture with routine healing: Secondary | ICD-10-CM | POA: Diagnosis not present

## 2018-11-29 DIAGNOSIS — I130 Hypertensive heart and chronic kidney disease with heart failure and stage 1 through stage 4 chronic kidney disease, or unspecified chronic kidney disease: Secondary | ICD-10-CM | POA: Diagnosis not present

## 2018-11-29 DIAGNOSIS — I4891 Unspecified atrial fibrillation: Secondary | ICD-10-CM | POA: Diagnosis not present

## 2018-11-29 DIAGNOSIS — I5022 Chronic systolic (congestive) heart failure: Secondary | ICD-10-CM | POA: Diagnosis not present

## 2018-11-29 DIAGNOSIS — M810 Age-related osteoporosis without current pathological fracture: Secondary | ICD-10-CM | POA: Diagnosis not present

## 2018-11-30 DIAGNOSIS — I739 Peripheral vascular disease, unspecified: Secondary | ICD-10-CM | POA: Diagnosis not present

## 2018-11-30 DIAGNOSIS — I5022 Chronic systolic (congestive) heart failure: Secondary | ICD-10-CM | POA: Diagnosis not present

## 2018-11-30 DIAGNOSIS — D509 Iron deficiency anemia, unspecified: Secondary | ICD-10-CM | POA: Diagnosis not present

## 2018-11-30 DIAGNOSIS — N183 Chronic kidney disease, stage 3 (moderate): Secondary | ICD-10-CM | POA: Diagnosis not present

## 2018-11-30 DIAGNOSIS — I429 Cardiomyopathy, unspecified: Secondary | ICD-10-CM | POA: Diagnosis not present

## 2018-11-30 DIAGNOSIS — S329XXA Fracture of unspecified parts of lumbosacral spine and pelvis, initial encounter for closed fracture: Secondary | ICD-10-CM | POA: Diagnosis not present

## 2018-11-30 DIAGNOSIS — S32501D Unspecified fracture of right pubis, subsequent encounter for fracture with routine healing: Secondary | ICD-10-CM | POA: Diagnosis not present

## 2018-11-30 DIAGNOSIS — I495 Sick sinus syndrome: Secondary | ICD-10-CM | POA: Diagnosis not present

## 2018-11-30 DIAGNOSIS — I4891 Unspecified atrial fibrillation: Secondary | ICD-10-CM | POA: Diagnosis not present

## 2018-11-30 DIAGNOSIS — I130 Hypertensive heart and chronic kidney disease with heart failure and stage 1 through stage 4 chronic kidney disease, or unspecified chronic kidney disease: Secondary | ICD-10-CM | POA: Diagnosis not present

## 2018-11-30 DIAGNOSIS — M810 Age-related osteoporosis without current pathological fracture: Secondary | ICD-10-CM | POA: Diagnosis not present

## 2018-12-01 DIAGNOSIS — I495 Sick sinus syndrome: Secondary | ICD-10-CM | POA: Diagnosis not present

## 2018-12-01 DIAGNOSIS — N183 Chronic kidney disease, stage 3 (moderate): Secondary | ICD-10-CM | POA: Diagnosis not present

## 2018-12-01 DIAGNOSIS — I130 Hypertensive heart and chronic kidney disease with heart failure and stage 1 through stage 4 chronic kidney disease, or unspecified chronic kidney disease: Secondary | ICD-10-CM | POA: Diagnosis not present

## 2018-12-01 DIAGNOSIS — I4891 Unspecified atrial fibrillation: Secondary | ICD-10-CM | POA: Diagnosis not present

## 2018-12-01 DIAGNOSIS — I739 Peripheral vascular disease, unspecified: Secondary | ICD-10-CM | POA: Diagnosis not present

## 2018-12-01 DIAGNOSIS — S32501D Unspecified fracture of right pubis, subsequent encounter for fracture with routine healing: Secondary | ICD-10-CM | POA: Diagnosis not present

## 2018-12-01 DIAGNOSIS — D509 Iron deficiency anemia, unspecified: Secondary | ICD-10-CM | POA: Diagnosis not present

## 2018-12-01 DIAGNOSIS — I429 Cardiomyopathy, unspecified: Secondary | ICD-10-CM | POA: Diagnosis not present

## 2018-12-01 DIAGNOSIS — M810 Age-related osteoporosis without current pathological fracture: Secondary | ICD-10-CM | POA: Diagnosis not present

## 2018-12-01 DIAGNOSIS — I5022 Chronic systolic (congestive) heart failure: Secondary | ICD-10-CM | POA: Diagnosis not present

## 2018-12-05 DIAGNOSIS — I4891 Unspecified atrial fibrillation: Secondary | ICD-10-CM | POA: Diagnosis not present

## 2018-12-05 DIAGNOSIS — D509 Iron deficiency anemia, unspecified: Secondary | ICD-10-CM | POA: Diagnosis not present

## 2018-12-05 DIAGNOSIS — I739 Peripheral vascular disease, unspecified: Secondary | ICD-10-CM | POA: Diagnosis not present

## 2018-12-05 DIAGNOSIS — I429 Cardiomyopathy, unspecified: Secondary | ICD-10-CM | POA: Diagnosis not present

## 2018-12-05 DIAGNOSIS — I5022 Chronic systolic (congestive) heart failure: Secondary | ICD-10-CM | POA: Diagnosis not present

## 2018-12-05 DIAGNOSIS — S32501D Unspecified fracture of right pubis, subsequent encounter for fracture with routine healing: Secondary | ICD-10-CM | POA: Diagnosis not present

## 2018-12-05 DIAGNOSIS — M810 Age-related osteoporosis without current pathological fracture: Secondary | ICD-10-CM | POA: Diagnosis not present

## 2018-12-05 DIAGNOSIS — I495 Sick sinus syndrome: Secondary | ICD-10-CM | POA: Diagnosis not present

## 2018-12-05 DIAGNOSIS — I130 Hypertensive heart and chronic kidney disease with heart failure and stage 1 through stage 4 chronic kidney disease, or unspecified chronic kidney disease: Secondary | ICD-10-CM | POA: Diagnosis not present

## 2018-12-05 DIAGNOSIS — N183 Chronic kidney disease, stage 3 (moderate): Secondary | ICD-10-CM | POA: Diagnosis not present

## 2018-12-06 DIAGNOSIS — S32501D Unspecified fracture of right pubis, subsequent encounter for fracture with routine healing: Secondary | ICD-10-CM | POA: Diagnosis not present

## 2018-12-06 DIAGNOSIS — N183 Chronic kidney disease, stage 3 (moderate): Secondary | ICD-10-CM | POA: Diagnosis not present

## 2018-12-06 DIAGNOSIS — I429 Cardiomyopathy, unspecified: Secondary | ICD-10-CM | POA: Diagnosis not present

## 2018-12-06 DIAGNOSIS — I5022 Chronic systolic (congestive) heart failure: Secondary | ICD-10-CM | POA: Diagnosis not present

## 2018-12-06 DIAGNOSIS — I495 Sick sinus syndrome: Secondary | ICD-10-CM | POA: Diagnosis not present

## 2018-12-06 DIAGNOSIS — I4891 Unspecified atrial fibrillation: Secondary | ICD-10-CM | POA: Diagnosis not present

## 2018-12-06 DIAGNOSIS — I739 Peripheral vascular disease, unspecified: Secondary | ICD-10-CM | POA: Diagnosis not present

## 2018-12-06 DIAGNOSIS — D509 Iron deficiency anemia, unspecified: Secondary | ICD-10-CM | POA: Diagnosis not present

## 2018-12-06 DIAGNOSIS — M810 Age-related osteoporosis without current pathological fracture: Secondary | ICD-10-CM | POA: Diagnosis not present

## 2018-12-06 DIAGNOSIS — I130 Hypertensive heart and chronic kidney disease with heart failure and stage 1 through stage 4 chronic kidney disease, or unspecified chronic kidney disease: Secondary | ICD-10-CM | POA: Diagnosis not present

## 2018-12-09 DIAGNOSIS — I130 Hypertensive heart and chronic kidney disease with heart failure and stage 1 through stage 4 chronic kidney disease, or unspecified chronic kidney disease: Secondary | ICD-10-CM | POA: Diagnosis not present

## 2018-12-09 DIAGNOSIS — I429 Cardiomyopathy, unspecified: Secondary | ICD-10-CM | POA: Diagnosis not present

## 2018-12-09 DIAGNOSIS — I4891 Unspecified atrial fibrillation: Secondary | ICD-10-CM | POA: Diagnosis not present

## 2018-12-09 DIAGNOSIS — D509 Iron deficiency anemia, unspecified: Secondary | ICD-10-CM | POA: Diagnosis not present

## 2018-12-09 DIAGNOSIS — N183 Chronic kidney disease, stage 3 (moderate): Secondary | ICD-10-CM | POA: Diagnosis not present

## 2018-12-09 DIAGNOSIS — M810 Age-related osteoporosis without current pathological fracture: Secondary | ICD-10-CM | POA: Diagnosis not present

## 2018-12-09 DIAGNOSIS — I5022 Chronic systolic (congestive) heart failure: Secondary | ICD-10-CM | POA: Diagnosis not present

## 2018-12-09 DIAGNOSIS — S32501D Unspecified fracture of right pubis, subsequent encounter for fracture with routine healing: Secondary | ICD-10-CM | POA: Diagnosis not present

## 2018-12-09 DIAGNOSIS — I739 Peripheral vascular disease, unspecified: Secondary | ICD-10-CM | POA: Diagnosis not present

## 2018-12-09 DIAGNOSIS — I495 Sick sinus syndrome: Secondary | ICD-10-CM | POA: Diagnosis not present

## 2018-12-14 DIAGNOSIS — I4891 Unspecified atrial fibrillation: Secondary | ICD-10-CM | POA: Diagnosis not present

## 2018-12-14 DIAGNOSIS — S32501D Unspecified fracture of right pubis, subsequent encounter for fracture with routine healing: Secondary | ICD-10-CM | POA: Diagnosis not present

## 2018-12-14 DIAGNOSIS — M810 Age-related osteoporosis without current pathological fracture: Secondary | ICD-10-CM | POA: Diagnosis not present

## 2018-12-14 DIAGNOSIS — D509 Iron deficiency anemia, unspecified: Secondary | ICD-10-CM | POA: Diagnosis not present

## 2018-12-14 DIAGNOSIS — I5022 Chronic systolic (congestive) heart failure: Secondary | ICD-10-CM | POA: Diagnosis not present

## 2018-12-14 DIAGNOSIS — N183 Chronic kidney disease, stage 3 (moderate): Secondary | ICD-10-CM | POA: Diagnosis not present

## 2018-12-14 DIAGNOSIS — I739 Peripheral vascular disease, unspecified: Secondary | ICD-10-CM | POA: Diagnosis not present

## 2018-12-14 DIAGNOSIS — I429 Cardiomyopathy, unspecified: Secondary | ICD-10-CM | POA: Diagnosis not present

## 2018-12-14 DIAGNOSIS — I495 Sick sinus syndrome: Secondary | ICD-10-CM | POA: Diagnosis not present

## 2018-12-14 DIAGNOSIS — I130 Hypertensive heart and chronic kidney disease with heart failure and stage 1 through stage 4 chronic kidney disease, or unspecified chronic kidney disease: Secondary | ICD-10-CM | POA: Diagnosis not present

## 2018-12-16 DIAGNOSIS — I5022 Chronic systolic (congestive) heart failure: Secondary | ICD-10-CM | POA: Diagnosis not present

## 2018-12-16 DIAGNOSIS — N183 Chronic kidney disease, stage 3 (moderate): Secondary | ICD-10-CM | POA: Diagnosis not present

## 2018-12-16 DIAGNOSIS — I4891 Unspecified atrial fibrillation: Secondary | ICD-10-CM | POA: Diagnosis not present

## 2018-12-16 DIAGNOSIS — D509 Iron deficiency anemia, unspecified: Secondary | ICD-10-CM | POA: Diagnosis not present

## 2018-12-16 DIAGNOSIS — I429 Cardiomyopathy, unspecified: Secondary | ICD-10-CM | POA: Diagnosis not present

## 2018-12-16 DIAGNOSIS — S32501D Unspecified fracture of right pubis, subsequent encounter for fracture with routine healing: Secondary | ICD-10-CM | POA: Diagnosis not present

## 2018-12-16 DIAGNOSIS — I739 Peripheral vascular disease, unspecified: Secondary | ICD-10-CM | POA: Diagnosis not present

## 2018-12-16 DIAGNOSIS — I130 Hypertensive heart and chronic kidney disease with heart failure and stage 1 through stage 4 chronic kidney disease, or unspecified chronic kidney disease: Secondary | ICD-10-CM | POA: Diagnosis not present

## 2018-12-16 DIAGNOSIS — M810 Age-related osteoporosis without current pathological fracture: Secondary | ICD-10-CM | POA: Diagnosis not present

## 2018-12-16 DIAGNOSIS — I495 Sick sinus syndrome: Secondary | ICD-10-CM | POA: Diagnosis not present

## 2018-12-18 DIAGNOSIS — I429 Cardiomyopathy, unspecified: Secondary | ICD-10-CM | POA: Diagnosis not present

## 2018-12-18 DIAGNOSIS — D509 Iron deficiency anemia, unspecified: Secondary | ICD-10-CM | POA: Diagnosis not present

## 2018-12-18 DIAGNOSIS — I739 Peripheral vascular disease, unspecified: Secondary | ICD-10-CM | POA: Diagnosis not present

## 2018-12-18 DIAGNOSIS — M810 Age-related osteoporosis without current pathological fracture: Secondary | ICD-10-CM | POA: Diagnosis not present

## 2018-12-18 DIAGNOSIS — I5022 Chronic systolic (congestive) heart failure: Secondary | ICD-10-CM | POA: Diagnosis not present

## 2018-12-18 DIAGNOSIS — I4891 Unspecified atrial fibrillation: Secondary | ICD-10-CM | POA: Diagnosis not present

## 2018-12-18 DIAGNOSIS — N183 Chronic kidney disease, stage 3 (moderate): Secondary | ICD-10-CM | POA: Diagnosis not present

## 2018-12-18 DIAGNOSIS — I495 Sick sinus syndrome: Secondary | ICD-10-CM | POA: Diagnosis not present

## 2018-12-18 DIAGNOSIS — S32501D Unspecified fracture of right pubis, subsequent encounter for fracture with routine healing: Secondary | ICD-10-CM | POA: Diagnosis not present

## 2018-12-18 DIAGNOSIS — I130 Hypertensive heart and chronic kidney disease with heart failure and stage 1 through stage 4 chronic kidney disease, or unspecified chronic kidney disease: Secondary | ICD-10-CM | POA: Diagnosis not present

## 2018-12-22 DIAGNOSIS — I739 Peripheral vascular disease, unspecified: Secondary | ICD-10-CM | POA: Diagnosis not present

## 2018-12-22 DIAGNOSIS — M810 Age-related osteoporosis without current pathological fracture: Secondary | ICD-10-CM | POA: Diagnosis not present

## 2018-12-22 DIAGNOSIS — S32501D Unspecified fracture of right pubis, subsequent encounter for fracture with routine healing: Secondary | ICD-10-CM | POA: Diagnosis not present

## 2018-12-22 DIAGNOSIS — N183 Chronic kidney disease, stage 3 (moderate): Secondary | ICD-10-CM | POA: Diagnosis not present

## 2018-12-22 DIAGNOSIS — I429 Cardiomyopathy, unspecified: Secondary | ICD-10-CM | POA: Diagnosis not present

## 2018-12-22 DIAGNOSIS — I495 Sick sinus syndrome: Secondary | ICD-10-CM | POA: Diagnosis not present

## 2018-12-22 DIAGNOSIS — D509 Iron deficiency anemia, unspecified: Secondary | ICD-10-CM | POA: Diagnosis not present

## 2018-12-22 DIAGNOSIS — I130 Hypertensive heart and chronic kidney disease with heart failure and stage 1 through stage 4 chronic kidney disease, or unspecified chronic kidney disease: Secondary | ICD-10-CM | POA: Diagnosis not present

## 2018-12-22 DIAGNOSIS — I5022 Chronic systolic (congestive) heart failure: Secondary | ICD-10-CM | POA: Diagnosis not present

## 2018-12-22 DIAGNOSIS — I4891 Unspecified atrial fibrillation: Secondary | ICD-10-CM | POA: Diagnosis not present

## 2018-12-24 DIAGNOSIS — I4891 Unspecified atrial fibrillation: Secondary | ICD-10-CM | POA: Diagnosis not present

## 2018-12-24 DIAGNOSIS — M810 Age-related osteoporosis without current pathological fracture: Secondary | ICD-10-CM | POA: Diagnosis not present

## 2018-12-24 DIAGNOSIS — I429 Cardiomyopathy, unspecified: Secondary | ICD-10-CM | POA: Diagnosis not present

## 2018-12-24 DIAGNOSIS — I130 Hypertensive heart and chronic kidney disease with heart failure and stage 1 through stage 4 chronic kidney disease, or unspecified chronic kidney disease: Secondary | ICD-10-CM | POA: Diagnosis not present

## 2018-12-24 DIAGNOSIS — I739 Peripheral vascular disease, unspecified: Secondary | ICD-10-CM | POA: Diagnosis not present

## 2018-12-24 DIAGNOSIS — I5022 Chronic systolic (congestive) heart failure: Secondary | ICD-10-CM | POA: Diagnosis not present

## 2018-12-24 DIAGNOSIS — D509 Iron deficiency anemia, unspecified: Secondary | ICD-10-CM | POA: Diagnosis not present

## 2018-12-24 DIAGNOSIS — S32501D Unspecified fracture of right pubis, subsequent encounter for fracture with routine healing: Secondary | ICD-10-CM | POA: Diagnosis not present

## 2018-12-24 DIAGNOSIS — I495 Sick sinus syndrome: Secondary | ICD-10-CM | POA: Diagnosis not present

## 2018-12-24 DIAGNOSIS — N183 Chronic kidney disease, stage 3 (moderate): Secondary | ICD-10-CM | POA: Diagnosis not present

## 2018-12-27 DIAGNOSIS — I5022 Chronic systolic (congestive) heart failure: Secondary | ICD-10-CM | POA: Diagnosis not present

## 2018-12-27 DIAGNOSIS — I130 Hypertensive heart and chronic kidney disease with heart failure and stage 1 through stage 4 chronic kidney disease, or unspecified chronic kidney disease: Secondary | ICD-10-CM | POA: Diagnosis not present

## 2018-12-27 DIAGNOSIS — M810 Age-related osteoporosis without current pathological fracture: Secondary | ICD-10-CM | POA: Diagnosis not present

## 2018-12-27 DIAGNOSIS — I429 Cardiomyopathy, unspecified: Secondary | ICD-10-CM | POA: Diagnosis not present

## 2018-12-27 DIAGNOSIS — N183 Chronic kidney disease, stage 3 (moderate): Secondary | ICD-10-CM | POA: Diagnosis not present

## 2018-12-27 DIAGNOSIS — S32501D Unspecified fracture of right pubis, subsequent encounter for fracture with routine healing: Secondary | ICD-10-CM | POA: Diagnosis not present

## 2018-12-27 DIAGNOSIS — I495 Sick sinus syndrome: Secondary | ICD-10-CM | POA: Diagnosis not present

## 2018-12-27 DIAGNOSIS — I4891 Unspecified atrial fibrillation: Secondary | ICD-10-CM | POA: Diagnosis not present

## 2018-12-27 DIAGNOSIS — I739 Peripheral vascular disease, unspecified: Secondary | ICD-10-CM | POA: Diagnosis not present

## 2018-12-27 DIAGNOSIS — D509 Iron deficiency anemia, unspecified: Secondary | ICD-10-CM | POA: Diagnosis not present

## 2018-12-28 DIAGNOSIS — I429 Cardiomyopathy, unspecified: Secondary | ICD-10-CM | POA: Diagnosis not present

## 2018-12-28 DIAGNOSIS — D509 Iron deficiency anemia, unspecified: Secondary | ICD-10-CM | POA: Diagnosis not present

## 2018-12-28 DIAGNOSIS — I4891 Unspecified atrial fibrillation: Secondary | ICD-10-CM | POA: Diagnosis not present

## 2018-12-28 DIAGNOSIS — I130 Hypertensive heart and chronic kidney disease with heart failure and stage 1 through stage 4 chronic kidney disease, or unspecified chronic kidney disease: Secondary | ICD-10-CM | POA: Diagnosis not present

## 2018-12-28 DIAGNOSIS — I739 Peripheral vascular disease, unspecified: Secondary | ICD-10-CM | POA: Diagnosis not present

## 2018-12-28 DIAGNOSIS — M810 Age-related osteoporosis without current pathological fracture: Secondary | ICD-10-CM | POA: Diagnosis not present

## 2018-12-28 DIAGNOSIS — I495 Sick sinus syndrome: Secondary | ICD-10-CM | POA: Diagnosis not present

## 2018-12-28 DIAGNOSIS — I5022 Chronic systolic (congestive) heart failure: Secondary | ICD-10-CM | POA: Diagnosis not present

## 2018-12-28 DIAGNOSIS — N183 Chronic kidney disease, stage 3 (moderate): Secondary | ICD-10-CM | POA: Diagnosis not present

## 2018-12-28 DIAGNOSIS — S32501D Unspecified fracture of right pubis, subsequent encounter for fracture with routine healing: Secondary | ICD-10-CM | POA: Diagnosis not present

## 2019-01-03 DIAGNOSIS — S32501D Unspecified fracture of right pubis, subsequent encounter for fracture with routine healing: Secondary | ICD-10-CM | POA: Diagnosis not present

## 2019-01-03 DIAGNOSIS — I4891 Unspecified atrial fibrillation: Secondary | ICD-10-CM | POA: Diagnosis not present

## 2019-01-03 DIAGNOSIS — M810 Age-related osteoporosis without current pathological fracture: Secondary | ICD-10-CM | POA: Diagnosis not present

## 2019-01-03 DIAGNOSIS — I130 Hypertensive heart and chronic kidney disease with heart failure and stage 1 through stage 4 chronic kidney disease, or unspecified chronic kidney disease: Secondary | ICD-10-CM | POA: Diagnosis not present

## 2019-01-03 DIAGNOSIS — D509 Iron deficiency anemia, unspecified: Secondary | ICD-10-CM | POA: Diagnosis not present

## 2019-01-03 DIAGNOSIS — N183 Chronic kidney disease, stage 3 (moderate): Secondary | ICD-10-CM | POA: Diagnosis not present

## 2019-01-03 DIAGNOSIS — I5022 Chronic systolic (congestive) heart failure: Secondary | ICD-10-CM | POA: Diagnosis not present

## 2019-01-03 DIAGNOSIS — I429 Cardiomyopathy, unspecified: Secondary | ICD-10-CM | POA: Diagnosis not present

## 2019-01-03 DIAGNOSIS — I739 Peripheral vascular disease, unspecified: Secondary | ICD-10-CM | POA: Diagnosis not present

## 2019-01-03 DIAGNOSIS — I495 Sick sinus syndrome: Secondary | ICD-10-CM | POA: Diagnosis not present

## 2019-01-05 ENCOUNTER — Encounter (INDEPENDENT_AMBULATORY_CARE_PROVIDER_SITE_OTHER): Payer: Medicare HMO

## 2019-01-05 ENCOUNTER — Ambulatory Visit (INDEPENDENT_AMBULATORY_CARE_PROVIDER_SITE_OTHER): Payer: Medicare HMO | Admitting: Nurse Practitioner

## 2019-01-11 DIAGNOSIS — I495 Sick sinus syndrome: Secondary | ICD-10-CM | POA: Diagnosis not present

## 2019-01-11 DIAGNOSIS — M810 Age-related osteoporosis without current pathological fracture: Secondary | ICD-10-CM | POA: Diagnosis not present

## 2019-01-11 DIAGNOSIS — N183 Chronic kidney disease, stage 3 (moderate): Secondary | ICD-10-CM | POA: Diagnosis not present

## 2019-01-11 DIAGNOSIS — I5022 Chronic systolic (congestive) heart failure: Secondary | ICD-10-CM | POA: Diagnosis not present

## 2019-01-11 DIAGNOSIS — I130 Hypertensive heart and chronic kidney disease with heart failure and stage 1 through stage 4 chronic kidney disease, or unspecified chronic kidney disease: Secondary | ICD-10-CM | POA: Diagnosis not present

## 2019-01-11 DIAGNOSIS — I4891 Unspecified atrial fibrillation: Secondary | ICD-10-CM | POA: Diagnosis not present

## 2019-01-11 DIAGNOSIS — I739 Peripheral vascular disease, unspecified: Secondary | ICD-10-CM | POA: Diagnosis not present

## 2019-01-11 DIAGNOSIS — I429 Cardiomyopathy, unspecified: Secondary | ICD-10-CM | POA: Diagnosis not present

## 2019-01-11 DIAGNOSIS — D509 Iron deficiency anemia, unspecified: Secondary | ICD-10-CM | POA: Diagnosis not present

## 2019-01-11 DIAGNOSIS — S32501D Unspecified fracture of right pubis, subsequent encounter for fracture with routine healing: Secondary | ICD-10-CM | POA: Diagnosis not present

## 2019-01-18 DIAGNOSIS — I4891 Unspecified atrial fibrillation: Secondary | ICD-10-CM | POA: Diagnosis not present

## 2019-01-18 DIAGNOSIS — I130 Hypertensive heart and chronic kidney disease with heart failure and stage 1 through stage 4 chronic kidney disease, or unspecified chronic kidney disease: Secondary | ICD-10-CM | POA: Diagnosis not present

## 2019-01-18 DIAGNOSIS — N183 Chronic kidney disease, stage 3 (moderate): Secondary | ICD-10-CM | POA: Diagnosis not present

## 2019-01-18 DIAGNOSIS — I5022 Chronic systolic (congestive) heart failure: Secondary | ICD-10-CM | POA: Diagnosis not present

## 2019-01-18 DIAGNOSIS — I495 Sick sinus syndrome: Secondary | ICD-10-CM | POA: Diagnosis not present

## 2019-01-18 DIAGNOSIS — M810 Age-related osteoporosis without current pathological fracture: Secondary | ICD-10-CM | POA: Diagnosis not present

## 2019-01-18 DIAGNOSIS — S32501D Unspecified fracture of right pubis, subsequent encounter for fracture with routine healing: Secondary | ICD-10-CM | POA: Diagnosis not present

## 2019-01-18 DIAGNOSIS — I429 Cardiomyopathy, unspecified: Secondary | ICD-10-CM | POA: Diagnosis not present

## 2019-01-18 DIAGNOSIS — D509 Iron deficiency anemia, unspecified: Secondary | ICD-10-CM | POA: Diagnosis not present

## 2019-01-18 DIAGNOSIS — I739 Peripheral vascular disease, unspecified: Secondary | ICD-10-CM | POA: Diagnosis not present

## 2019-02-24 ENCOUNTER — Other Ambulatory Visit: Payer: Self-pay | Admitting: Oncology

## 2019-02-25 ENCOUNTER — Other Ambulatory Visit: Payer: Self-pay

## 2019-02-25 ENCOUNTER — Inpatient Hospital Stay: Payer: Medicare HMO | Attending: Oncology

## 2019-02-25 ENCOUNTER — Inpatient Hospital Stay (HOSPITAL_BASED_OUTPATIENT_CLINIC_OR_DEPARTMENT_OTHER): Payer: Medicare HMO | Admitting: Oncology

## 2019-02-25 ENCOUNTER — Encounter: Payer: Self-pay | Admitting: Oncology

## 2019-02-25 ENCOUNTER — Inpatient Hospital Stay: Payer: Medicare HMO

## 2019-02-25 VITALS — BP 156/86 | HR 64 | Temp 98.3°F | Resp 16 | Ht 70.0 in | Wt 180.8 lb

## 2019-02-25 DIAGNOSIS — Z862 Personal history of diseases of the blood and blood-forming organs and certain disorders involving the immune mechanism: Secondary | ICD-10-CM

## 2019-02-25 DIAGNOSIS — N189 Chronic kidney disease, unspecified: Secondary | ICD-10-CM | POA: Insufficient documentation

## 2019-02-25 DIAGNOSIS — D631 Anemia in chronic kidney disease: Secondary | ICD-10-CM | POA: Diagnosis not present

## 2019-02-25 DIAGNOSIS — Z87891 Personal history of nicotine dependence: Secondary | ICD-10-CM | POA: Diagnosis not present

## 2019-02-25 DIAGNOSIS — D509 Iron deficiency anemia, unspecified: Secondary | ICD-10-CM | POA: Diagnosis not present

## 2019-02-25 DIAGNOSIS — Z8546 Personal history of malignant neoplasm of prostate: Secondary | ICD-10-CM | POA: Diagnosis not present

## 2019-02-25 LAB — COMPREHENSIVE METABOLIC PANEL
ALT: 21 U/L (ref 0–44)
AST: 21 U/L (ref 15–41)
Albumin: 3.7 g/dL (ref 3.5–5.0)
Alkaline Phosphatase: 62 U/L (ref 38–126)
Anion gap: 7 (ref 5–15)
BUN: 23 mg/dL (ref 8–23)
CO2: 26 mmol/L (ref 22–32)
Calcium: 9.4 mg/dL (ref 8.9–10.3)
Chloride: 104 mmol/L (ref 98–111)
Creatinine, Ser: 1.54 mg/dL — ABNORMAL HIGH (ref 0.61–1.24)
GFR calc Af Amer: 44 mL/min — ABNORMAL LOW (ref >60)
GFR calc non Af Amer: 38 mL/min — ABNORMAL LOW (ref >60)
GLUCOSE: 99 mg/dL (ref 70–99)
Potassium: 3.9 mmol/L (ref 3.5–5.1)
Sodium: 137 mmol/L (ref 135–145)
Total Bilirubin: 0.6 mg/dL (ref 0.3–1.2)
Total Protein: 7.1 g/dL (ref 6.5–8.1)

## 2019-02-25 LAB — CBC WITH DIFFERENTIAL/PLATELET
Abs Immature Granulocytes: 0.03 10*3/uL (ref 0.00–0.07)
Basophils Absolute: 0.1 10*3/uL (ref 0.0–0.1)
Basophils Relative: 1 %
Eosinophils Absolute: 0.2 10*3/uL (ref 0.0–0.5)
Eosinophils Relative: 2 %
HCT: 30.4 % — ABNORMAL LOW (ref 39.0–52.0)
Hemoglobin: 9.9 g/dL — ABNORMAL LOW (ref 13.0–17.0)
Immature Granulocytes: 0 %
Lymphocytes Relative: 12 %
Lymphs Abs: 1.1 10*3/uL (ref 0.7–4.0)
MCH: 28.2 pg (ref 26.0–34.0)
MCHC: 32.6 g/dL (ref 30.0–36.0)
MCV: 86.6 fL (ref 80.0–100.0)
Monocytes Absolute: 0.6 10*3/uL (ref 0.1–1.0)
Monocytes Relative: 6 %
Neutro Abs: 7.2 10*3/uL (ref 1.7–7.7)
Neutrophils Relative %: 79 %
PLATELETS: 192 10*3/uL (ref 150–400)
RBC: 3.51 MIL/uL — ABNORMAL LOW (ref 4.22–5.81)
RDW: 17.3 % — ABNORMAL HIGH (ref 11.5–15.5)
WBC: 9.1 10*3/uL (ref 4.0–10.5)
nRBC: 0 % (ref 0.0–0.2)

## 2019-02-25 LAB — IRON AND TIBC
Iron: 55 ug/dL (ref 45–182)
Saturation Ratios: 23 % (ref 17.9–39.5)
TIBC: 238 ug/dL — ABNORMAL LOW (ref 250–450)
UIBC: 183 ug/dL

## 2019-02-25 LAB — FERRITIN: Ferritin: 295 ng/mL (ref 24–336)

## 2019-02-25 NOTE — Progress Notes (Signed)
Patient reports having short ness of breath in the morning for the past week.

## 2019-03-02 NOTE — Progress Notes (Signed)
Hematology/Oncology Consult note [facility]  [phone] [phone]  Patient Care Team: Albina Billet, MD as PCP - General (Internal Medicine)   Name of the patient: Devin Washington  [id number]  1923-12-01   Date of visit: 03/02/19  Diagnosis- anemia likely multifactorial- iron and b12 deficiency and anemia of chronic kidney disease  Chief complaint/ Reason for visit-follow-up of anemia  Heme/Onc history: patient is a 85 year old male with a past medical history significant for atrial fibrillation, CHF, hypertension and prostate cancer who presented to the ER after he suffered a fall. On admission he was found to have H&H of 7.1/22.2 with an MCV of 80.1 and a platelet count of 268.BMP showed an elevated creatinine of 1.9. TSH was normal at 0.52.Patient received 1 unit of PRBC in the ER. Ferritin checked per day later was 152. Iron studies revealed low serum iron of 27, low TIBC of 229 and low iron saturation of 12%. B12 levels were low normal at 264. Folate level was normal at 17.5. Reticulocyte count was low at 2% for the degree of anemia.Coombs test was negative and haptoglobin was normal. LDH was mildly elevated at 228. Myeloma panel showed M protein of IgG kappa 0.5 g. Both kappa and lambda free light chains were elevated with a normal free light chain ratio 0.77. E Po level was 22.3  Patient underwent EGD and colonoscopy on 04/04/2018. EGD showed nonobstructing nonbleeding superficial duodenal ulcers with a clean ulcer base. There are nonbleeding erosions in the gastric antrum andno stigmata of recent bleeding.Duodenum were suspicious for H. pylori.Colonoscopy showed hemorrhoids and 7 mm cecalpolyp  Interval history-patient reports doing well.  He is independent of ADLs but needs assistance with ADLs.  He was admitted in October 2019 after a fall and lower GI bleed probably secondary to trauma.  His hemoglobin at that  time dropped down to 7.8 and was seen by GI but did not require any endoscopy dimension.  ECOG PS- 2 Pain scale- 0 Opioid associated constipation- no  Review of systems- Review of Systems  Constitutional: Positive for malaise/fatigue. Negative for chills, fever and weight loss.  HENT: Negative for congestion, ear discharge and nosebleeds.   Eyes: Negative for blurred vision.  Respiratory: Negative for cough, hemoptysis, sputum production, shortness of breath and wheezing.   Cardiovascular: Negative for chest pain, palpitations, orthopnea and claudication.  Gastrointestinal: Negative for abdominal pain, blood in stool, constipation, diarrhea, heartburn, melena, nausea and vomiting.  Genitourinary: Negative for dysuria, flank pain, frequency, hematuria and urgency.  Musculoskeletal: Negative for back pain, joint pain and myalgias.  Skin: Negative for rash.  Neurological: Negative for dizziness, tingling, focal weakness, seizures, weakness and headaches.  Endo/Heme/Allergies: Does not bruise/bleed easily.  Psychiatric/Behavioral: Negative for depression and suicidal ideas. The patient does not have insomnia.       No Known Allergies   Past Medical History:  Diagnosis Date  . A-fib ([place])   . Cardiomyopathy ([place])   . CHF (congestive heart failure) ([place])   . DJD (degenerative joint disease)   . Duodenal ulcer 08/09/2015  . Dysrhythmia   . Erosive esophagitis   . HH (hiatus hernia)   . HLD (hyperlipidemia)   . Hypertension   . Pacemaker   . PAD (peripheral artery disease) ([place])   . Peripheral vascular disease ([place])   . Presence of permanent cardiac pacemaker   . Prostate cancer [facility])    Prostatectomy.  . 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 Shortness of breath   .  SSS (sick sinus syndrome) [facility])      Past Surgical History:  Procedure Laterality Date  . CATARACT EXTRACTION, BILATERAL    . COLONOSCOPY N/A 04/04/2018   Procedure: COLONOSCOPY;  Surgeon: Lin Landsman, MD;   Location: [facility] ENDOSCOPY;  Service: Gastroenterology;  Laterality: N/A;  . ESOPHAGOGASTRODUODENOSCOPY N/A 08/09/2015   Procedure: ESOPHAGOGASTRODUODENOSCOPY (EGD) looking in the stomach with a lighted tube to evaluate and treat;  Surgeon: Lollie Sails, MD;  Location: [facility] ENDOSCOPY;  Service: Endoscopy;  Laterality: N/A;  Procedure to be done tomorrow afternoon, 08/08/2015. Awaiting cardiology consult  . ESOPHAGOGASTRODUODENOSCOPY N/A 04/04/2018   Procedure: ESOPHAGOGASTRODUODENOSCOPY (EGD);  Surgeon: Lin Landsman, MD;  Location: [facility] ENDOSCOPY;  Service: Gastroenterology;  Laterality: N/A;  . EYE SURGERY    . FEMORAL-POPLITEAL BYPASS GRAFT Left 12/16/2017   Procedure: BYPASS GRAFT FEMORAL-POPLITEAL ARTERY ( OPEN POPLITEAL BYPASS );  Surgeon: Algernon Huxley, MD;  Location: [facility] ORS;  Service: Vascular;  Laterality: Left;  . FLEXIBLE SIGMOIDOSCOPY N/A 08/09/2015   Procedure: FLEXIBLE SIGMOIDOSCOPY looking up the rectum into the distal colon with a lighted tube to examine and treat;  Surgeon: Lollie Sails, MD;  Location: [facility] ENDOSCOPY;  Service: Endoscopy;  Laterality: N/A;  Procedure to be done tomorrow afternoon, 08/08/2015. Awaiting cardiology consult.  . INSERT / REPLACE / REMOVE PACEMAKER    . JOINT REPLACEMENT Left    left knee replacement  . LOWER EXTREMITY ANGIOGRAPHY Left 11/16/2017   Procedure: LOWER EXTREMITY ANGIOGRAPHY;  Surgeon: Algernon Huxley, MD;  Location: [place] CV LAB;  Service: Cardiovascular;  Laterality: Left;  . LOWER EXTREMITY ANGIOGRAPHY Right 11/30/2017   Procedure: LOWER EXTREMITY ANGIOGRAPHY;  Surgeon: Algernon Huxley, MD;  Location: [place] CV LAB;  Service: Cardiovascular;  Laterality: Right;  . LOWER EXTREMITY ANGIOGRAPHY Left 03/04/2018   Procedure: LOWER EXTREMITY ANGIOGRAPHY;  Surgeon: Algernon Huxley, MD;  Location: [place] CV LAB;  Service: Cardiovascular;  Laterality: Left;  . LOWER EXTREMITY ANGIOGRAPHY Left 04/12/2018   Procedure: LOWER  EXTREMITY ANGIOGRAPHY;  Surgeon: Algernon Huxley, MD;  Location: [place] CV LAB;  Service: Cardiovascular;  Laterality: Left;  . PACEMAKER INSERTION    . PROSTATE SURGERY    . PROSTATE SURGERY      Social History   Socioeconomic History  . Marital status: Widowed    Spouse name: Not on file  . Number of children: Not on file  . Years of education: Not on file  . Highest education level: Not on file  Occupational History    Employer: [employer]  Social Needs  . Financial resource strain: Not on file  . Food insecurity:    Worry: Not on file    Inability: Not on file  . Transportation needs:    Medical: Not on file    Non-medical: Not on file  Tobacco Use  . Smoking status: Former Smoker    Last attempt to quit: 03/02/1958    Years since quitting: 61.0  . Smokeless tobacco: Former Systems developer    Types: Chew, Snuff  Substance and Sexual Activity  . Alcohol use: No    Alcohol/week: 0.0 standard drinks  . Drug use: No  . Sexual activity: Not Currently  Lifestyle  . Physical activity:    Days per week: Not on file    Minutes per session: Not on file  . Stress: Not on file  Relationships  . Social connections:    Talks on phone: Not on file  Gets together: Not on file    Attends religious service: Not on file    Active member of club or organization: Not on file    Attends meetings of clubs or organizations: Not on file    Relationship status: Not on file  . Intimate partner violence:    Fear of current or ex partner: Not on file    Emotionally abused: Not on file    Physically abused: Not on file    Forced sexual activity: Not on file  Other Topics Concern  . Not on file  Social History Narrative  . Not on file    Family History  Problem Relation Age of Onset  . Lung cancer Father      Current Outpatient Medications:  .  acetaminophen (TYLENOL) 325 MG tablet, Take 2 tablets (650 mg total) by mouth every 6 (six) hours as needed for mild pain  (or Fever >/= 101)., Disp: , Rfl:  .  albuterol (PROVENTIL HFA;VENTOLIN HFA) 108 (90 Base) MCG/ACT inhaler, Inhale 2 puffs into the lungs every 6 (six) hours as needed for wheezing or shortness of breath., Disp: 1 Inhaler, Rfl: 2 .  amLODipine (NORVASC) 2.5 MG tablet, Take 2.5 mg by mouth daily. , Disp: , Rfl:  .  ANORO ELLIPTA 62.5-25 MCG/INH AEPB, Inhale 1 puff into the lungs 2 (two) times daily., Disp: , Rfl:  .  benazepril (LOTENSIN) 20 MG tablet, Take 1 tablet (20 mg total) by mouth daily., Disp: 30 tablet, Rfl: 0 .  feeding supplement, ENSURE ENLIVE, (ENSURE ENLIVE) LIQD, Take 237 mLs by mouth 2 (two) times daily between meals., Disp: 60 Bottle, Rfl: 1 .  ferrous sulfate 325 (65 FE) MG tablet, Take 1 tablet (325 mg total) by mouth 2 (two) times daily with a meal., Disp: 60 tablet, Rfl: 3 .  furosemide (LASIX) 20 MG tablet, Take 1 tablet by mouth daily., Disp: , Rfl:  .  hydrocortisone (ANUSOL-HC) 25 MG suppository, Place 1 suppository (25 mg total) rectally 2 (two) times daily., Disp: 12 suppository, Rfl: 0 .  metoprolol tartrate (LOPRESSOR) 25 MG tablet, Take 1 tablet (25 mg total) by mouth 2 (two) times daily., Disp: 60 tablet, Rfl: 0 .  pantoprazole (PROTONIX) 40 MG tablet, Take 1 tablet (40 mg total) by mouth daily., Disp: 30 tablet, Rfl: 0 .  polyethylene glycol (MIRALAX / GLYCOLAX) packet, Take 17 g by mouth daily as needed for moderate constipation., Disp: 14 each, Rfl: 0 .  senna-docusate (SENOKOT-S) 8.6-50 MG tablet, Take 1 tablet by mouth 2 (two) times daily., Disp: , Rfl:  .  simvastatin (ZOCOR) 20 MG tablet, Take 20 mg by mouth daily. , Disp: , Rfl:  .  traMADol (ULTRAM) 50 MG tablet, Take 1 tablet (50 mg total) by mouth every 6 (six) hours as needed for moderate pain or severe pain., Disp: 30 tablet, Rfl: 0 .  traZODone (DESYREL) 50 MG tablet, Take 1 tablet by mouth at bedtime as needed for sleep. , Disp: , Rfl:  .  vitamin B-12 1000 MCG tablet, Take 1 tablet (1,000 mcg total) by  mouth daily., Disp: 60 tablet, Rfl: 1 No current facility-administered medications for this visit.   Facility-Administered Medications Ordered in Other Visits:  .  Darbepoetin Alfa (ARANESP) injection 100 mcg, 100 mcg, Subcutaneous, Q28 days, Sindy Guadeloupe, MD, 100 mcg at 04/16/18 1505  Physical exam:  Vitals:   02/25/19 1102 02/25/19 1107  BP:  (!) 156/86  Pulse:  64  Resp: 16  Temp:  98.3 F (36.8 C)  TempSrc:  Tympanic  Weight: 180 lb 12.8 oz (82 kg)   Height: 5\' 10"  (1.778 m)    Physical Exam Constitutional:      Comments: Elderly gentleman sitting in a wheelchair.  Appears in acute stress  HENT:     Head: Normocephalic and atraumatic.  Eyes:     Pupils: Pupils are equal, round, and reactive to light.  Neck:     Musculoskeletal: Normal range of motion.  Cardiovascular:     Rate and Rhythm: Normal rate and regular rhythm.     Heart sounds: Normal heart sounds.  Pulmonary:     Effort: Pulmonary effort is normal.     Breath sounds: Normal breath sounds.  Abdominal:     General: Bowel sounds are normal.     Palpations: Abdomen is soft.  Skin:    General: Skin is warm and dry.  Neurological:     Mental Status: He is alert and oriented to person, place, and time.      CMP Latest Ref Rng & Units 02/25/2019  Glucose 70 - 99 mg/dL 99  BUN 8 - 23 mg/dL 23  Creatinine 0.61 - 1.24 mg/dL 1.54(H)  Sodium 135 - 145 mmol/L 137  Potassium 3.5 - 5.1 mmol/L 3.9  Chloride 98 - 111 mmol/L 104  CO2 22 - 32 mmol/L 26  Calcium 8.9 - 10.3 mg/dL 9.4  Total Protein 6.5 - 8.1 g/dL 7.1  Total Bilirubin 0.3 - 1.2 mg/dL 0.6  Alkaline Phos 38 - 126 U/L 62  AST 15 - 41 U/L 21  ALT 0 - 44 U/L 21   CBC Latest Ref Rng & Units 02/25/2019  WBC 4.0 - 10.5 K/uL 9.1  Hemoglobin 13.0 - 17.0 g/dL 9.9(L)  Hematocrit 39.0 - 52.0 % 30.4(L)  Platelets 150 - 400 K/uL 192    No images are attached to the encounter.  No results found.   Assessment and plan- Patient is a 85 y.o. male with  multifactorial anemia-combination of iron deficiency as well as anemia of chronic kidney disease  Patient's hemoglobin is 9.9 and his iron studies are normal.  Since hemoglobin is so close to 9 we will hold off on initiating Procrit at this time.  CBC ferritin and iron studies in 3 in 6 months and I will see him back in 6 months   Visit Diagnosis 1. History of anemia due to chronic kidney disease      Dr. Randa Evens, MD, MPH [facility] [phone] 03/02/2019 7:36 AM

## 2019-03-18 ENCOUNTER — Encounter (INDEPENDENT_AMBULATORY_CARE_PROVIDER_SITE_OTHER): Payer: Medicare HMO

## 2019-03-18 ENCOUNTER — Ambulatory Visit (INDEPENDENT_AMBULATORY_CARE_PROVIDER_SITE_OTHER): Payer: Medicare HMO | Admitting: Vascular Surgery

## 2019-04-19 DIAGNOSIS — I10 Essential (primary) hypertension: Secondary | ICD-10-CM | POA: Diagnosis not present

## 2019-05-05 DIAGNOSIS — H903 Sensorineural hearing loss, bilateral: Secondary | ICD-10-CM | POA: Diagnosis not present

## 2019-05-06 ENCOUNTER — Ambulatory Visit (INDEPENDENT_AMBULATORY_CARE_PROVIDER_SITE_OTHER): Payer: Medicare HMO

## 2019-05-06 ENCOUNTER — Other Ambulatory Visit: Payer: Self-pay

## 2019-05-06 ENCOUNTER — Ambulatory Visit (INDEPENDENT_AMBULATORY_CARE_PROVIDER_SITE_OTHER): Payer: Medicare HMO | Admitting: Vascular Surgery

## 2019-05-06 ENCOUNTER — Encounter (INDEPENDENT_AMBULATORY_CARE_PROVIDER_SITE_OTHER): Payer: Self-pay | Admitting: Vascular Surgery

## 2019-05-06 VITALS — BP 192/91 | HR 67 | Resp 10 | Ht 70.0 in | Wt 178.0 lb

## 2019-05-06 DIAGNOSIS — E785 Hyperlipidemia, unspecified: Secondary | ICD-10-CM | POA: Diagnosis not present

## 2019-05-06 DIAGNOSIS — I724 Aneurysm of artery of lower extremity: Secondary | ICD-10-CM

## 2019-05-06 DIAGNOSIS — Z79899 Other long term (current) drug therapy: Secondary | ICD-10-CM | POA: Diagnosis not present

## 2019-05-06 DIAGNOSIS — I739 Peripheral vascular disease, unspecified: Secondary | ICD-10-CM

## 2019-05-06 DIAGNOSIS — I10 Essential (primary) hypertension: Secondary | ICD-10-CM | POA: Diagnosis not present

## 2019-05-06 DIAGNOSIS — Z9889 Other specified postprocedural states: Secondary | ICD-10-CM | POA: Diagnosis not present

## 2019-05-06 DIAGNOSIS — I723 Aneurysm of iliac artery: Secondary | ICD-10-CM | POA: Diagnosis not present

## 2019-05-06 DIAGNOSIS — Z87891 Personal history of nicotine dependence: Secondary | ICD-10-CM

## 2019-05-06 DIAGNOSIS — Z9862 Peripheral vascular angioplasty status: Secondary | ICD-10-CM | POA: Diagnosis not present

## 2019-05-06 NOTE — Assessment & Plan Note (Signed)
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  No significant growth.  Below the threshold to consider repair in a nonagenarian.  Recheck in 6 months.

## 2019-05-06 NOTE — Assessment & Plan Note (Signed)
Status post repair bilaterally, percutaneous on the right and open bypass on the left.  Overall doing well.  Both interventions/bypasses are patent at this point with good flow distally on studies today.  Popliteal aneurysm size remain fairly large at over 4 cm, but no growth and no flow in the aneurysm sacs.  Overall doing quite well.  His left leg swelling is a lot better than it was after surgery but still noticeable.  Compression and elevation as needed.  Recheck noninvasive studies in 6 months.

## 2019-05-06 NOTE — Progress Notes (Signed)
MRN : [id number]  Devin Washington is a 85 y.o. (Sep 22, 1923) male who presents with chief complaint of  Chief Complaint  Patient presents with  . Follow-up  .  History of Present Illness: Patient returns today in follow up of multiple vascular issues.  He is doing quite well.  His chronic left leg swelling is stable and not significantly worsened.  This is far better than it was after his open popliteal aneurysm repair on the left in the immediate postoperative period.  He had massive popliteal aneurysms bilaterally.  The right was managed percutaneously while the left required open surgical repair.  Both are doing well with good flow distally seen on noninvasive studies today and no growth of the aneurysms. He also has significant iliac artery aneurysms.  He has no symptoms referrable to the aneurysms of the iliac arteries. Specifically, the patient denies new back or abdominal pain, or signs of peripheral embolization.  These are 2.6 to 2.7 cm on duplex today showing no significant growth  Current Outpatient Medications  Medication Sig Dispense Refill  . acetaminophen (TYLENOL) 325 MG tablet Take 2 tablets (650 mg total) by mouth every 6 (six) hours as needed for mild pain (or Fever >/= 101).    Marland Kitchen albuterol (PROVENTIL HFA;VENTOLIN HFA) 108 (90 Base) MCG/ACT inhaler Inhale 2 puffs into the lungs every 6 (six) hours as needed for wheezing or shortness of breath. 1 Inhaler 2  . amLODipine (NORVASC) 2.5 MG tablet Take 2.5 mg by mouth daily.     Jearl Klinefelter ELLIPTA 62.5-25 MCG/INH AEPB Inhale 1 puff into the lungs 2 (two) times daily.    . benazepril (LOTENSIN) 20 MG tablet Take 1 tablet (20 mg total) by mouth daily. 30 tablet 0  . feeding supplement, ENSURE ENLIVE, (ENSURE ENLIVE) LIQD Take 237 mLs by mouth 2 (two) times daily between meals. 60 Bottle 1  . ferrous sulfate 325 (65 FE) MG tablet Take 1 tablet (325 mg total) by mouth 2 (two) times daily with a meal. 60 tablet 3  . furosemide (LASIX) 20  MG tablet Take 1 tablet by mouth daily.    . hydrocortisone (ANUSOL-HC) 25 MG suppository Place 1 suppository (25 mg total) rectally 2 (two) times daily. 12 suppository 0  . metoprolol tartrate (LOPRESSOR) 25 MG tablet Take 1 tablet (25 mg total) by mouth 2 (two) times daily. 60 tablet 0  . pantoprazole (PROTONIX) 40 MG tablet Take 1 tablet (40 mg total) by mouth daily. 30 tablet 0  . polyethylene glycol (MIRALAX / GLYCOLAX) packet Take 17 g by mouth daily as needed for moderate constipation. 14 each 0  . senna-docusate (SENOKOT-S) 8.6-50 MG tablet Take 1 tablet by mouth 2 (two) times daily.    . simvastatin (ZOCOR) 20 MG tablet Take 20 mg by mouth daily.     . traMADol (ULTRAM) 50 MG tablet Take 1 tablet (50 mg total) by mouth every 6 (six) hours as needed for moderate pain or severe pain. 30 tablet 0  . traZODone (DESYREL) 50 MG tablet Take 1 tablet by mouth at bedtime as needed for sleep.     . vitamin B-12 1000 MCG tablet Take 1 tablet (1,000 mcg total) by mouth daily. 60 tablet 1   No current facility-administered medications for this visit.    Facility-Administered Medications Ordered in Other Visits  Medication Dose Route Frequency Provider Last Rate Last Dose  . Darbepoetin Alfa (ARANESP) injection 100 mcg  100 mcg Subcutaneous Q28 days Randa Evens  C, MD   100 mcg at 04/16/18 1505    Past Medical History:  Diagnosis Date  . A-fib ([place])   . Cardiomyopathy ([place])   . CHF (congestive heart failure) ([place])   . DJD (degenerative joint disease)   . Duodenal ulcer 08/09/2015  . Dysrhythmia   . Erosive esophagitis   . HH (hiatus hernia)   . HLD (hyperlipidemia)   . Hypertension   . Pacemaker   . PAD (peripheral artery disease) ([place])   . Peripheral vascular disease ([place])   . Presence of permanent cardiac pacemaker   . Prostate cancer [facility])    Prostatectomy.  . 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 Shortness of breath   . SSS (sick sinus syndrome) [facility])     Past Surgical History:   Procedure Laterality Date  . CATARACT EXTRACTION, BILATERAL    . COLONOSCOPY N/A 04/04/2018   Procedure: COLONOSCOPY;  Surgeon: Lin Landsman, MD;  Location: [facility] ENDOSCOPY;  Service: Gastroenterology;  Laterality: N/A;  . ESOPHAGOGASTRODUODENOSCOPY N/A 08/09/2015   Procedure: ESOPHAGOGASTRODUODENOSCOPY (EGD) looking in the stomach with a lighted tube to evaluate and treat;  Surgeon: Lollie Sails, MD;  Location: [facility] ENDOSCOPY;  Service: Endoscopy;  Laterality: N/A;  Procedure to be done tomorrow afternoon, 08/08/2015. Awaiting cardiology consult  . ESOPHAGOGASTRODUODENOSCOPY N/A 04/04/2018   Procedure: ESOPHAGOGASTRODUODENOSCOPY (EGD);  Surgeon: Lin Landsman, MD;  Location: [facility] ENDOSCOPY;  Service: Gastroenterology;  Laterality: N/A;  . EYE SURGERY    . FEMORAL-POPLITEAL BYPASS GRAFT Left 12/16/2017   Procedure: BYPASS GRAFT FEMORAL-POPLITEAL ARTERY ( OPEN POPLITEAL BYPASS );  Surgeon: Algernon Huxley, MD;  Location: [facility] ORS;  Service: Vascular;  Laterality: Left;  . FLEXIBLE SIGMOIDOSCOPY N/A 08/09/2015   Procedure: FLEXIBLE SIGMOIDOSCOPY looking up the rectum into the distal colon with a lighted tube to examine and treat;  Surgeon: Lollie Sails, MD;  Location: [facility] ENDOSCOPY;  Service: Endoscopy;  Laterality: N/A;  Procedure to be done tomorrow afternoon, 08/08/2015. Awaiting cardiology consult.  . INSERT / REPLACE / REMOVE PACEMAKER    . JOINT REPLACEMENT Left    left knee replacement  . LOWER EXTREMITY ANGIOGRAPHY Left 11/16/2017   Procedure: LOWER EXTREMITY ANGIOGRAPHY;  Surgeon: Algernon Huxley, MD;  Location: [place] CV LAB;  Service: Cardiovascular;  Laterality: Left;  . LOWER EXTREMITY ANGIOGRAPHY Right 11/30/2017   Procedure: LOWER EXTREMITY ANGIOGRAPHY;  Surgeon: Algernon Huxley, MD;  Location: [place] CV LAB;  Service: Cardiovascular;  Laterality: Right;  . LOWER EXTREMITY ANGIOGRAPHY Left 03/04/2018   Procedure: LOWER EXTREMITY ANGIOGRAPHY;  Surgeon: Algernon Huxley, MD;  Location: [place] CV LAB;  Service: Cardiovascular;  Laterality: Left;  . LOWER EXTREMITY ANGIOGRAPHY Left 04/12/2018   Procedure: LOWER EXTREMITY ANGIOGRAPHY;  Surgeon: Algernon Huxley, MD;  Location: [place] CV LAB;  Service: Cardiovascular;  Laterality: Left;  . PACEMAKER INSERTION    . PROSTATE SURGERY    . PROSTATE SURGERY     Social History        Tobacco Use  . Smoking status: Former Smoker    Last attempt to quit: 03/02/1958    Years since quitting: 60.2  . Smokeless tobacco: Former Systems developer    Types: Chew, Snuff  Substance Use Topics  . Alcohol use: No    Alcohol/week: 0.0 oz  . Drug use: No    Family History      Family History  Problem Relation Age of Onset  . Lung cancer Father     No Known Allergies  REVIEW OF SYSTEMS(Negative unless checked)  Constitutional: [] ?Weight loss [] ?Fever [] ?Chills Cardiac: [] ?Chest pain [] ?Chest pressure [] ?Palpitations [] ?Shortness of breath when laying flat [] ?Shortness of breath at rest [] ?Shortness of breath with exertion. Vascular: [x] ?Pain in legs with walking [] ?Pain in legs at rest [] ?Pain in legs when laying flat [] ?Claudication [] ?Pain in feet when walking [] ?Pain in feet at rest [] ?Pain in feet when laying flat [] ?History of DVT [] ?Phlebitis [x] ?Swelling in legs [] ?Varicose veins [] ?Non-healing ulcers Pulmonary: [] ?Uses home oxygen [] ?Productive cough [] ?Hemoptysis [] ?Wheeze [] ?COPD [] ?Asthma Neurologic: [] ?Dizziness [] ?Blackouts [] ?Seizures [] ?History of stroke [] ?History of TIA [] ?Aphasia [] ?Temporary blindness [] ?Dysphagia [] ?Weakness or numbness in arms [] ?Weakness or numbness in legs Musculoskeletal: [x] ?Arthritis [] ?Joint swelling [] ?Joint pain [] ?Low back pain Hematologic: [] ?Easy bruising [] ?Easy bleeding [] ?Hypercoagulable state [x] ?Anemic  Gastrointestinal: [] ?Blood in stool [] ?Vomiting blood  [] ?Gastroesophageal reflux/heartburn [] ?Abdominal pain Genitourinary: [] ?Chronic kidney disease [] ?Difficult urination [] ?Frequent urination [] ?Burning with urination [] ?Hematuria Skin: [] ?Rashes [] ?Ulcers [] ?Wounds Psychological: [] ?History of anxiety [] ?History of major depression.    Physical Examination  BP (!) 192/91 (BP Location: Left Arm, Patient Position: Sitting, Cuff Size: Small)   Pulse 67   Resp 10   Ht 5\' 10"  (1.778 m)   Wt 178 lb (80.7 kg)   BMI 25.54 kg/m  Gen:  WD/WN, NAD. Appears far younger than stated age. Head: [place]/AT, No temporalis wasting. Ear/Nose/Throat: Hearing grossly intact, nares w/o erythema or drainage Eyes: Conjunctiva clear. Sclera non-icteric Neck: Supple.  Trachea midline Pulmonary:  Good air movement, no use of accessory muscles.  Cardiac: RRR, no JVD Vascular:  Vessel Right Left  Radial Palpable Palpable                          PT Palpable Palpable  DP Palpable Palpable   Gastrointestinal: soft, non-tender/non-distended.  Musculoskeletal: M/S 5/5 throughout.  No deformity or atrophy. Trace RLE edema, 1-2+ LLE edema. Neurologic: Sensation grossly intact in extremities.  Symmetrical.  Speech is fluent.  Psychiatric: Judgment intact, Mood & affect appropriate for pt's clinical situation. Dermatologic: No rashes or ulcers noted.  No cellulitis or open wounds.       Labs Recent Results (from the past 2160 hour(s))  Iron and TIBC     Status: Abnormal   Collection Time: 02/25/19 10:43 AM  Result Value Ref Range   Iron 55 45 - 182 ug/dL   TIBC 238 (L) 250 - 450 ug/dL   Saturation Ratios 23 17.9 - 39.5 %   UIBC 183 ug/dL    Comment: Performed at [facility], [address] [place], [place] [address]  Ferritin     Status: None   Collection Time: 02/25/19 10:43 AM  Result Value Ref Range   Ferritin 295 24 - 336 ng/mL    Comment: Performed at [facility], [address] [place], [place]  [address]  Comprehensive metabolic panel     Status: Abnormal   Collection Time: 02/25/19 10:43 AM  Result Value Ref Range   Sodium 137 135 - 145 mmol/L   Potassium 3.9 3.5 - 5.1 mmol/L   Chloride 104 98 - 111 mmol/L   CO2 26 22 - 32 mmol/L   Glucose, Bld 99 70 - 99 mg/dL   BUN 23 8 - 23 mg/dL   Creatinine, Ser 1.54 (H) 0.61 - 1.24 mg/dL   Calcium 9.4 8.9 - 10.3 mg/dL   Total Protein 7.1 6.5 - 8.1 g/dL   Albumin 3.7 3.5 - 5.0 g/dL   AST 21 15 - 41  U/L   ALT 21 0 - 44 U/L   Alkaline Phosphatase 62 38 - 126 U/L   Total Bilirubin 0.6 0.3 - 1.2 mg/dL   GFR calc non Af Amer 38 (L) >60 mL/min   GFR calc Af Amer 44 (L) >60 mL/min   Anion gap 7 5 - 15    Comment: Performed at [facility], [address] [place], [place] [address]  CBC with Differential/Platelet     Status: Abnormal   Collection Time: 02/25/19 10:43 AM  Result Value Ref Range   WBC 9.1 4.0 - 10.5 K/uL   RBC 3.51 (L) 4.22 - 5.81 MIL/uL   Hemoglobin 9.9 (L) 13.0 - 17.0 g/dL   HCT 30.4 (L) 39.0 - 52.0 %   MCV 86.6 80.0 - 100.0 fL   MCH 28.2 26.0 - 34.0 pg   MCHC 32.6 30.0 - 36.0 g/dL   RDW 17.3 (H) 11.5 - 15.5 %   Platelets 192 150 - 400 K/uL   nRBC 0.0 0.0 - 0.2 %   Neutrophils Relative % 79 %   Neutro Abs 7.2 1.7 - 7.7 K/uL   Lymphocytes Relative 12 %   Lymphs Abs 1.1 0.7 - 4.0 K/uL   Monocytes Relative 6 %   Monocytes Absolute 0.6 0.1 - 1.0 K/uL   Eosinophils Relative 2 %   Eosinophils Absolute 0.2 0.0 - 0.5 K/uL   Basophils Relative 1 %   Basophils Absolute 0.1 0.0 - 0.1 K/uL   Immature Granulocytes 0 %   Abs Immature Granulocytes 0.03 0.00 - 0.07 K/uL    Comment: Performed at [facility], [address] [place], [place] [address]    Radiology Vas [place] Abi With/wo Tbi  Result Date: 05/06/2019 LOWER EXTREMITY DOPPLER STUDY Indications: Peripheral artery disease.  Vascular Interventions: 12/16/2017 Left CFA to peroneal vein graft with multiple                         coil embolization .                          03/04/2018 PTA and revasc of distal graft.                         12/17/2018Right sfa to pop stent placement thru                         popliteal aneurysm. Comparison Study: 09/14/2018 Performing Technologist: Charlane Ferretti RT (R)(VS)  Examination Guidelines: A complete evaluation includes at minimum, Doppler waveform signals and systolic blood pressure reading at the level of bilateral brachial, anterior tibial, and posterior tibial arteries, when vessel segments are accessible. Bilateral testing is considered an integral part of a complete examination. Photoelectric Plethysmograph (PPG) waveforms and toe systolic pressure readings are included as required and additional duplex testing as needed. Limited examinations for reoccurring indications may be performed as noted.  ABI Findings: +---------+------------------+-----+----------+--------+ Right    Rt Pressure (mmHg)IndexWaveform  Comment  +---------+------------------+-----+----------+--------+ Brachial 189                                       +---------+------------------+-----+----------+--------+ ATA  monophasicNC       +---------+------------------+-----+----------+--------+ PTA                             monophasicNC       +---------+------------------+-----+----------+--------+ Great Toe                       Dampened  [place]       +---------+------------------+-----+----------+--------+ +---------+------------------+-----+----------+-------+ Left     Lt Pressure (mmHg)IndexWaveform  Comment +---------+------------------+-----+----------+-------+ Brachial 199                                      +---------+------------------+-----+----------+-------+ ATA                             monophasicNC      +---------+------------------+-----+----------+-------+ PTA                             monophasicNC      +---------+------------------+-----+----------+-------+  Great Toe                       Dampened  [place]      +---------+------------------+-----+----------+-------+ +-------+-----------+-----------+------------+------------+ ABI/TBIToday's ABIToday's TBIPrevious ABIPrevious TBI +-------+-----------+-----------+------------+------------+ Right  [place]         [place]         [place]          .84          +-------+-----------+-----------+------------+------------+ Left   [place]         [place]         [place]          .91          +-------+-----------+-----------+------------+------------+ Bilateral ABIs appear essentially unchanged compared to prior study on 09/14/2018. Bilateral TBIs appear increased compared to prior study on 09/14/2018. Summary: Right: Resting right ankle-brachial index indicates noncompressible right lower extremity arteries.The right toe-brachial index is abnormal. ABIs are unreliable. TBIs are unreliable. Left: Resting left ankle-brachial index indicates noncompressible left lower extremity arteries.The left toe-brachial index is abnormal. ABIs are unreliable. TBIs are unreliable.  *See table(s) above for measurements and observations.  Electronically signed by Leotis Pain MD on 05/06/2019 at 12:20:10 PM.    Final    Vas [place] Lower Extremity Arterial Duplex  Result Date: 05/06/2019 LOWER EXTREMITY ARTERIAL DUPLEX STUDY Indications: Peripheral artery disease.  Vascular Interventions: 12/16/2017 Left CFA to peroneal vein graft with multiple                         coil embolization .                         03/04/2018 PTA and revasc of distal graft.                         12/17/2018Right sfa to pop stent placement thru                         popliteal aneurysm. Current ABI:            ABI Right = [place] Left = [place] Comparison Study: 09/14/2018 Performing Technologist: Charlane Ferretti RT (R)(VS)  Examination Guidelines: A complete  evaluation includes B-mode imaging, spectral Doppler, color Doppler, and power Doppler as needed of all accessible portions of each vessel.  Bilateral testing is considered an integral part of a complete examination. Limited examinations for reoccurring indications may be performed as noted.  +----------+--------+-----+--------+----------+--------+ RIGHT     PSV cm/sRatioStenosisWaveform  Comments +----------+--------+-----+--------+----------+--------+ CFA Prox  47                   biphasic           +----------+--------+-----+--------+----------+--------+ DFA       60                   biphasic           +----------+--------+-----+--------+----------+--------+ SFA Prox  49                   biphasic           +----------+--------+-----+--------+----------+--------+ SFA Mid   25                   biphasic           +----------+--------+-----+--------+----------+--------+ SFA Distal23                   biphasic           +----------+--------+-----+--------+----------+--------+ POP Prox  25                   biphasic           +----------+--------+-----+--------+----------+--------+ POP Mid                        biphasic  stent    +----------+--------+-----+--------+----------+--------+ POP Distal21                   biphasic  stent    +----------+--------+-----+--------+----------+--------+ ATA Distal56                   monophasic         +----------+--------+-----+--------+----------+--------+ PTA Distal57                   monophasicstent    +----------+--------+-----+--------+----------+--------+ +-----------------+-----------------------+--------------+ Right Diameter(s)Anterior/Posterial (cm)Transverse(cm) +-----------------+-----------------------+--------------+ Popliteal        2.80                   4.10           +-----------------+-----------------------+--------------+  Right Stent(s): +--------------+--++--------++ Prox to Stent 27biphasic +--------------+--++--------++ Proximal Stent21biphasic +--------------+--++--------++ Distal Stent   21biphasic +--------------+--++--------++  +----------+--------+-----+--------+----------+--------+ LEFT      PSV cm/sRatioStenosisWaveform  Comments +----------+--------+-----+--------+----------+--------+ CFA Prox  45                   biphasic           +----------+--------+-----+--------+----------+--------+ DFA       69                   biphasic           +----------+--------+-----+--------+----------+--------+ SFA Prox               occluded                   +----------+--------+-----+--------+----------+--------+ SFA Mid                occluded                   +----------+--------+-----+--------+----------+--------+ SFA Distal  occluded                   +----------+--------+-----+--------+----------+--------+ POP Prox               occluded                   +----------+--------+-----+--------+----------+--------+ POP Mid                occluded                   +----------+--------+-----+--------+----------+--------+ POP Distal             occluded                   +----------+--------+-----+--------+----------+--------+ PTA [id number]                  monophasic         +----------+--------+-----+--------+----------+--------+  +-----------------+-----------------------+---------------+ Left Diameter(s):Anterior/Posterior (cm)Transverse (cm) +-----------------+-----------------------+---------------+ Popliteal        4.10                   4.20            +-----------------+-----------------------+---------------+ Left Stent(s): +---------------+--++--------++ Prox to Stent  34biphasic +---------------+--++--------++ Proximal Stent 31biphasic +---------------+--++--------++ Mid Stent      17biphasic +---------------+--++--------++ Distal to Stent96         +---------------+--++--------++    Summary: Right: Post hypoechoic area posterior knee. Possible aneurysm with no detectable flow  measuring 4.58cm x 2.97cm x 3.38cm. Left: Left hypoechoic areas posterior to knee measuring 6.20cm x 4.56cm x 4.61cm and second posterior calf measuring 11.82cm x 8.02cm x 9.5cm with no detected vascular flow.   See table(s) above for measurements and observations. Electronically signed by Leotis Pain MD on 05/06/2019 at 12:20:19 PM.    Final    Vas US Aorta/ivc/iliacs  Result Date: 05/06/2019 ABDOMINAL AORTA STUDY Indications: Follow up exam for known AAA. Limitations: Obesity and air/bowel gas.  Comparison Study: 03/23/2018 Performing Technologist: Charlane Ferretti RT (R)(VS)  Examination Guidelines: A complete evaluation includes B-mode imaging, spectral Doppler, color Doppler, and power Doppler as needed of all accessible portions of each vessel. Bilateral testing is considered an integral part of a complete examination. Limited examinations for reoccurring indications may be performed as noted.  Abdominal Aorta Findings: +-------------+-------+----------+----------+--------+--------+--------+ Location     AP (cm)Trans (cm)PSV (cm/s)WaveformThrombusComments +-------------+-------+----------+----------+--------+--------+--------+ Proximal     2.98   2.88      81                                 +-------------+-------+----------+----------+--------+--------+--------+ Mid          2.50   2.70      122                                +-------------+-------+----------+----------+--------+--------+--------+ Distal       3.02   3.35      145                                +-------------+-------+----------+----------+--------+--------+--------+ RT CIA Prox  2.6    2.7       197                                +-------------+-------+----------+----------+--------+--------+--------+  RT CIA Distal                 97                                 +-------------+-------+----------+----------+--------+--------+--------+ RT EIA Prox  1.2    1.2       95                                  +-------------+-------+----------+----------+--------+--------+--------+ LT CIA Prox  1.7    1.8       72                                 +-------------+-------+----------+----------+--------+--------+--------+ LT EIA Prox  1.3    1.2       100                                +-------------+-------+----------+----------+--------+--------+--------+  Summary: Abdominal Aorta: There is evidence of abnormal dilitation of the Distal Abdominal aorta. The largest aortic measurement is 3.4 cm. The largest aortic diameter remains essentially unchanged compared to prior exam. Previous diameter measurement was 3.0 cm obtained on 03/23/2018.  *See table(s) above for measurements and observations.  Electronically signed by Leotis Pain MD on 05/06/2019 at 12:20:25 PM.   Final     Assessment/Plan HTN (hypertension) blood pressure control important in reducing the progression of atherosclerotic disease. On appropriate oral medications.  CKD (chronic kidney disease), stage III Hydration important with each contrast procedure.  HLD (hyperlipidemia) lipid control important in reducing the progression of atherosclerotic disease. Continue statin therapy  Iliac artery aneurysm [facility]) His iliac artery aneurysms measure 2.6 to 2.7 cm in maximal diameter on duplex today.  No significant growth.  Below the threshold to consider repair in a nonagenarian.  Recheck in 6 months.  Bilateral popliteal artery aneurysm (HCC) Status post repair bilaterally, percutaneous on the right and open bypass on the left.  Overall doing well.  Both interventions/bypasses are patent at this point with good flow distally on studies today.  Popliteal aneurysm size remain fairly large at over 4 cm, but no growth and no flow in the aneurysm sacs.  Overall doing quite well.  His left leg swelling is a lot better than it was after surgery but still noticeable.  Compression and elevation as needed.  Recheck noninvasive studies in  6 months.    Leotis Pain, MD  05/06/2019 2:28 PM    This note was created with Dragon medical transcription system.  Any errors from dictation are purely unintentional

## 2019-05-20 ENCOUNTER — Other Ambulatory Visit: Payer: Self-pay

## 2019-05-20 ENCOUNTER — Inpatient Hospital Stay: Payer: Medicare HMO | Attending: Oncology

## 2019-05-20 DIAGNOSIS — D631 Anemia in chronic kidney disease: Secondary | ICD-10-CM | POA: Diagnosis not present

## 2019-05-20 DIAGNOSIS — N2581 Secondary hyperparathyroidism of renal origin: Secondary | ICD-10-CM | POA: Diagnosis not present

## 2019-05-20 DIAGNOSIS — D509 Iron deficiency anemia, unspecified: Secondary | ICD-10-CM

## 2019-05-20 DIAGNOSIS — N189 Chronic kidney disease, unspecified: Secondary | ICD-10-CM | POA: Insufficient documentation

## 2019-05-20 DIAGNOSIS — N183 Chronic kidney disease, stage 3 (moderate): Secondary | ICD-10-CM | POA: Diagnosis not present

## 2019-05-20 DIAGNOSIS — I129 Hypertensive chronic kidney disease with stage 1 through stage 4 chronic kidney disease, or unspecified chronic kidney disease: Secondary | ICD-10-CM | POA: Diagnosis not present

## 2019-05-20 LAB — CBC WITH DIFFERENTIAL/PLATELET
Abs Immature Granulocytes: 0.06 10*3/uL (ref 0.00–0.07)
Basophils Absolute: 0.1 10*3/uL (ref 0.0–0.1)
Basophils Relative: 0 %
Eosinophils Absolute: 0.2 10*3/uL (ref 0.0–0.5)
Eosinophils Relative: 1 %
HCT: 33.2 % — ABNORMAL LOW (ref 39.0–52.0)
Hemoglobin: 10.7 g/dL — ABNORMAL LOW (ref 13.0–17.0)
Immature Granulocytes: 1 %
Lymphocytes Relative: 15 %
Lymphs Abs: 1.7 10*3/uL (ref 0.7–4.0)
MCH: 28.7 pg (ref 26.0–34.0)
MCHC: 32.2 g/dL (ref 30.0–36.0)
MCV: 89 fL (ref 80.0–100.0)
Monocytes Absolute: 0.7 10*3/uL (ref 0.1–1.0)
Monocytes Relative: 6 %
Neutro Abs: 8.7 10*3/uL — ABNORMAL HIGH (ref 1.7–7.7)
Neutrophils Relative %: 77 %
Platelets: 197 10*3/uL (ref 150–400)
RBC: 3.73 MIL/uL — ABNORMAL LOW (ref 4.22–5.81)
RDW: 15.3 % (ref 11.5–15.5)
WBC: 11.4 10*3/uL — ABNORMAL HIGH (ref 4.0–10.5)
nRBC: 0 % (ref 0.0–0.2)

## 2019-05-20 LAB — COMPREHENSIVE METABOLIC PANEL
ALT: 25 U/L (ref 0–44)
AST: 22 U/L (ref 15–41)
Albumin: 3.8 g/dL (ref 3.5–5.0)
Alkaline Phosphatase: 67 U/L (ref 38–126)
Anion gap: 9 (ref 5–15)
BUN: 22 mg/dL (ref 8–23)
CO2: 25 mmol/L (ref 22–32)
Calcium: 9.7 mg/dL (ref 8.9–10.3)
Chloride: 104 mmol/L (ref 98–111)
Creatinine, Ser: 1.6 mg/dL — ABNORMAL HIGH (ref 0.61–1.24)
GFR calc Af Amer: 42 mL/min — ABNORMAL LOW (ref >60)
GFR calc non Af Amer: 37 mL/min — ABNORMAL LOW (ref >60)
Glucose, Bld: 99 mg/dL (ref 70–99)
Potassium: 3.9 mmol/L (ref 3.5–5.1)
Sodium: 138 mmol/L (ref 135–145)
Total Bilirubin: 0.6 mg/dL (ref 0.3–1.2)
Total Protein: 7.6 g/dL (ref 6.5–8.1)

## 2019-05-20 LAB — IRON AND TIBC
Iron: 45 ug/dL (ref 45–182)
Saturation Ratios: 18 % (ref 17.9–39.5)
TIBC: 247 ug/dL — ABNORMAL LOW (ref 250–450)
UIBC: 202 ug/dL

## 2019-05-20 LAB — FERRITIN: Ferritin: 322 ng/mL (ref 24–336)

## 2019-05-27 ENCOUNTER — Other Ambulatory Visit: Payer: Medicare HMO

## 2019-06-22 DIAGNOSIS — I495 Sick sinus syndrome: Secondary | ICD-10-CM | POA: Diagnosis not present

## 2019-06-29 DIAGNOSIS — I429 Cardiomyopathy, unspecified: Secondary | ICD-10-CM | POA: Diagnosis not present

## 2019-06-29 DIAGNOSIS — R0602 Shortness of breath: Secondary | ICD-10-CM | POA: Diagnosis not present

## 2019-06-29 DIAGNOSIS — I5032 Chronic diastolic (congestive) heart failure: Secondary | ICD-10-CM | POA: Diagnosis not present

## 2019-06-29 DIAGNOSIS — R011 Cardiac murmur, unspecified: Secondary | ICD-10-CM | POA: Diagnosis not present

## 2019-06-29 DIAGNOSIS — I480 Paroxysmal atrial fibrillation: Secondary | ICD-10-CM | POA: Diagnosis not present

## 2019-06-29 DIAGNOSIS — N183 Chronic kidney disease, stage 3 (moderate): Secondary | ICD-10-CM | POA: Diagnosis not present

## 2019-06-29 DIAGNOSIS — I10 Essential (primary) hypertension: Secondary | ICD-10-CM | POA: Diagnosis not present

## 2019-06-29 DIAGNOSIS — I495 Sick sinus syndrome: Secondary | ICD-10-CM | POA: Diagnosis not present

## 2019-06-29 DIAGNOSIS — I723 Aneurysm of iliac artery: Secondary | ICD-10-CM | POA: Diagnosis not present

## 2019-06-29 DIAGNOSIS — E7849 Other hyperlipidemia: Secondary | ICD-10-CM | POA: Diagnosis not present

## 2019-08-17 DIAGNOSIS — R809 Proteinuria, unspecified: Secondary | ICD-10-CM | POA: Diagnosis not present

## 2019-08-17 DIAGNOSIS — N183 Chronic kidney disease, stage 3 (moderate): Secondary | ICD-10-CM | POA: Diagnosis not present

## 2019-08-17 DIAGNOSIS — N2581 Secondary hyperparathyroidism of renal origin: Secondary | ICD-10-CM | POA: Diagnosis not present

## 2019-08-17 DIAGNOSIS — D631 Anemia in chronic kidney disease: Secondary | ICD-10-CM | POA: Diagnosis not present

## 2019-08-17 DIAGNOSIS — I129 Hypertensive chronic kidney disease with stage 1 through stage 4 chronic kidney disease, or unspecified chronic kidney disease: Secondary | ICD-10-CM | POA: Diagnosis not present

## 2019-08-18 ENCOUNTER — Telehealth (INDEPENDENT_AMBULATORY_CARE_PROVIDER_SITE_OTHER): Payer: Self-pay | Admitting: Vascular Surgery

## 2019-08-18 NOTE — Telephone Encounter (Signed)
Lets get the patient moved up to whenever we have room to fit in his ultrasounds.  He can see me or dew

## 2019-08-18 NOTE — Telephone Encounter (Signed)
Dr Juleen China had seen the patient and he is having severe left leg swelling and wanted to see if we can move up the left le arterial from 11/15/2019

## 2019-08-23 DIAGNOSIS — I4940 Unspecified premature depolarization: Secondary | ICD-10-CM | POA: Diagnosis not present

## 2019-08-23 DIAGNOSIS — J449 Chronic obstructive pulmonary disease, unspecified: Secondary | ICD-10-CM | POA: Diagnosis not present

## 2019-08-23 DIAGNOSIS — N189 Chronic kidney disease, unspecified: Secondary | ICD-10-CM | POA: Diagnosis not present

## 2019-08-23 DIAGNOSIS — I10 Essential (primary) hypertension: Secondary | ICD-10-CM | POA: Diagnosis not present

## 2019-08-24 ENCOUNTER — Ambulatory Visit (INDEPENDENT_AMBULATORY_CARE_PROVIDER_SITE_OTHER): Payer: Medicare HMO | Admitting: Nurse Practitioner

## 2019-08-24 ENCOUNTER — Encounter (INDEPENDENT_AMBULATORY_CARE_PROVIDER_SITE_OTHER): Payer: Medicare HMO

## 2019-08-24 ENCOUNTER — Encounter (INDEPENDENT_AMBULATORY_CARE_PROVIDER_SITE_OTHER): Payer: Self-pay | Admitting: Nurse Practitioner

## 2019-08-24 ENCOUNTER — Other Ambulatory Visit: Payer: Self-pay

## 2019-08-24 ENCOUNTER — Ambulatory Visit (INDEPENDENT_AMBULATORY_CARE_PROVIDER_SITE_OTHER): Payer: Medicare HMO

## 2019-08-24 VITALS — BP 164/65 | HR 71 | Resp 14 | Ht 70.5 in | Wt 178.0 lb

## 2019-08-24 DIAGNOSIS — I724 Aneurysm of artery of lower extremity: Secondary | ICD-10-CM | POA: Diagnosis not present

## 2019-08-24 DIAGNOSIS — E7800 Pure hypercholesterolemia, unspecified: Secondary | ICD-10-CM | POA: Diagnosis not present

## 2019-08-24 DIAGNOSIS — I10 Essential (primary) hypertension: Secondary | ICD-10-CM | POA: Diagnosis not present

## 2019-08-24 DIAGNOSIS — I495 Sick sinus syndrome: Secondary | ICD-10-CM | POA: Insufficient documentation

## 2019-08-24 DIAGNOSIS — R0602 Shortness of breath: Secondary | ICD-10-CM | POA: Insufficient documentation

## 2019-08-24 DIAGNOSIS — I429 Cardiomyopathy, unspecified: Secondary | ICD-10-CM | POA: Insufficient documentation

## 2019-08-24 DIAGNOSIS — I4891 Unspecified atrial fibrillation: Secondary | ICD-10-CM | POA: Insufficient documentation

## 2019-08-24 DIAGNOSIS — R001 Bradycardia, unspecified: Secondary | ICD-10-CM | POA: Insufficient documentation

## 2019-08-24 DIAGNOSIS — I509 Heart failure, unspecified: Secondary | ICD-10-CM | POA: Insufficient documentation

## 2019-08-24 NOTE — Progress Notes (Signed)
SUBJECTIVE:  Patient ID: Devin Washington, male    DOB: Dec 14, 1923, 85 y.o.   MRN: [id number] Chief Complaint  Patient presents with  . Follow-up    HPI  Devin Washington is a 85 y.o. male that presents to the office today after concern for left lower extremity swelling.  The patient has a previous history of bilateral popliteal artery aneurysms.  His right was treated with a stent his left had to be surgically resected.  The patient has some persistent swelling on his left lower extremity.  This is partially due to the size of the popliteal aneurysm pushing some of the tissues medially.  However, there was concern due to the fact that the swelling was much greater than normal.  The patient has been having some pain with ambulation but not enough to stop his walking.  Today the patient underwent a bilateral lower extremity arterial duplex.  The right lower extremity has an occluded anterior tibial artery at the distal portion.  Otherwise the patient has biphasic waveforms until he reaches the tibial arteries which transitions to monophasic waveforms.  The stent present in the SFA and popliteal areas patent.  The diameter of the right popliteal is 2.8 cm x 3.87  cm.  The left lower extremity has the presence of a graft which is revealed to be patent at this time.  The graft has biphasic waveforms all the way throughout.  The tibial arteries have monophasic waveforms in the distal area.  The distal posterior tibial artery is occluded.  The left popliteal aneurysm measures 5.1 cm x 4.7 cm, with no flow seen within.    Past Medical History:  Diagnosis Date  . A-fib ([place])   . Cardiomyopathy ([place])   . CHF (congestive heart failure) ([place])   . DJD (degenerative joint disease)   . Duodenal ulcer 08/09/2015  . Dysrhythmia   . Erosive esophagitis   . HH (hiatus hernia)   . HLD (hyperlipidemia)   . Hypertension   . Pacemaker   . PAD (peripheral artery disease) ([place])   . Peripheral vascular  disease ([place])   . Presence of permanent cardiac pacemaker   . Prostate cancer [facility])    Prostatectomy.  . 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 Shortness of breath   . SSS (sick sinus syndrome) [facility])     Past Surgical History:  Procedure Laterality Date  . CATARACT EXTRACTION, BILATERAL    . COLONOSCOPY N/A 04/04/2018   Procedure: COLONOSCOPY;  Surgeon: Lin Landsman, MD;  Location: [facility] ENDOSCOPY;  Service: Gastroenterology;  Laterality: N/A;  . ESOPHAGOGASTRODUODENOSCOPY N/A 08/09/2015   Procedure: ESOPHAGOGASTRODUODENOSCOPY (EGD) looking in the stomach with a lighted tube to evaluate and treat;  Surgeon: Lollie Sails, MD;  Location: [facility] ENDOSCOPY;  Service: Endoscopy;  Laterality: N/A;  Procedure to be done tomorrow afternoon, 08/08/2015. Awaiting cardiology consult  . ESOPHAGOGASTRODUODENOSCOPY N/A 04/04/2018   Procedure: ESOPHAGOGASTRODUODENOSCOPY (EGD);  Surgeon: Lin Landsman, MD;  Location: [facility] ENDOSCOPY;  Service: Gastroenterology;  Laterality: N/A;  . EYE SURGERY    . FEMORAL-POPLITEAL BYPASS GRAFT Left 12/16/2017   Procedure: BYPASS GRAFT FEMORAL-POPLITEAL ARTERY ( OPEN POPLITEAL BYPASS );  Surgeon: Algernon Huxley, MD;  Location: [facility] ORS;  Service: Vascular;  Laterality: Left;  . FLEXIBLE SIGMOIDOSCOPY N/A 08/09/2015   Procedure: FLEXIBLE SIGMOIDOSCOPY looking up the rectum into the distal colon with a lighted tube to examine and treat;  Surgeon: Lollie Sails, MD;  Location: [facility] ENDOSCOPY;  Service: Endoscopy;  Laterality: N/A;  Procedure to be done tomorrow afternoon, 08/08/2015. Awaiting cardiology consult.  . INSERT / REPLACE / REMOVE PACEMAKER    . JOINT REPLACEMENT Left    left knee replacement  . LOWER EXTREMITY ANGIOGRAPHY Left 11/16/2017   Procedure: LOWER EXTREMITY ANGIOGRAPHY;  Surgeon: Algernon Huxley, MD;  Location: [place] CV LAB;  Service: Cardiovascular;  Laterality: Left;  . LOWER EXTREMITY ANGIOGRAPHY Right 11/30/2017   Procedure: LOWER EXTREMITY  ANGIOGRAPHY;  Surgeon: Algernon Huxley, MD;  Location: [place] CV LAB;  Service: Cardiovascular;  Laterality: Right;  . LOWER EXTREMITY ANGIOGRAPHY Left 03/04/2018   Procedure: LOWER EXTREMITY ANGIOGRAPHY;  Surgeon: Algernon Huxley, MD;  Location: [place] CV LAB;  Service: Cardiovascular;  Laterality: Left;  . LOWER EXTREMITY ANGIOGRAPHY Left 04/12/2018   Procedure: LOWER EXTREMITY ANGIOGRAPHY;  Surgeon: Algernon Huxley, MD;  Location: [place] CV LAB;  Service: Cardiovascular;  Laterality: Left;  . PACEMAKER INSERTION    . PROSTATE SURGERY    . PROSTATE SURGERY      Social History   Socioeconomic History  . Marital status: Widowed    Spouse name: Not on file  . Number of children: Not on file  . Years of education: Not on file  . Highest education level: Not on file  Occupational History    Employer: [employer]  Social Needs  . Financial resource strain: Not on file  . Food insecurity    Worry: Not on file    Inability: Not on file  . Transportation needs    Medical: Not on file    Non-medical: Not on file  Tobacco Use  . Smoking status: Former Smoker    Quit date: 03/02/1958    Years since quitting: 61.5  . Smokeless tobacco: Former Systems developer    Types: Chew, Snuff  Substance and Sexual Activity  . Alcohol use: No    Alcohol/week: 0.0 standard drinks  . Drug use: No  . Sexual activity: Not Currently  Lifestyle  . Physical activity    Days per week: Not on file    Minutes per session: Not on file  . Stress: Not on file  Relationships  . Social Herbalist on phone: Not on file    Gets together: Not on file    Attends religious service: Not on file    Active member of club or organization: Not on file    Attends meetings of clubs or organizations: Not on file    Relationship status: Not on file  . Intimate partner violence    Fear of current or ex partner: Not on file    Emotionally abused: Not on file    Physically abused: Not on file     Forced sexual activity: Not on file  Other Topics Concern  . Not on file  Social History Narrative  . Not on file    Family History  Problem Relation Age of Onset  . Lung cancer Father     No Known Allergies   Review of Systems   Review of Systems: Negative Unless Checked Constitutional: [] Weight loss  [] Fever  [] Chills Cardiac: [] Chest pain   []  Atrial Fibrillation  [] Palpitations   [] Shortness of breath when laying flat   [x] Shortness of breath with exertion. [] Shortness of breath at rest Vascular:  [] Pain in legs with walking   [] Pain in legs with standing [] Pain in legs when laying flat   [] Claudication    [] Pain in feet when laying flat    []   History of DVT   [] Phlebitis   [x] Swelling in legs   [] Varicose veins   [] Non-healing ulcers Pulmonary:   [] Uses home oxygen   [] Productive cough   [] Hemoptysis   [] Wheeze  [] COPD   [] Asthma Neurologic:  [] Dizziness   [] Seizures  [] Blackouts [] History of stroke   [] History of TIA  [] Aphasia   [] Temporary Blindness   [] Weakness or numbness in arm   [x] Weakness or numbness in leg Musculoskeletal:   [] Joint swelling   [] Joint pain   [] Low back pain  []  History of Knee Replacement [] Arthritis [] back Surgeries  []  Spinal Stenosis    Hematologic:  [] Easy bruising  [] Easy bleeding   [] Hypercoagulable state   [x] Anemic Gastrointestinal:  [] Diarrhea   [] Vomiting  [] Gastroesophageal reflux/heartburn   [] Difficulty swallowing. [] Abdominal pain Genitourinary:  [] Chronic kidney disease   [] Difficult urination  [] Anuric   [] Blood in urine [] Frequent urination  [] Burning with urination   [] Hematuria Skin:  [] Rashes   [] Ulcers [] Wounds Psychological:  [] History of anxiety   []  History of major depression  []  Memory Difficulties      OBJECTIVE:   Physical Exam  BP (!) 164/65 (BP Location: Left Arm, Patient Position: Sitting, Cuff Size: Large)   Pulse 71   Resp 14   Ht 5' 10.5" (1.791 m)   Wt 178 lb (80.7 kg)   BMI 25.18 kg/m   Gen: WD/WN, NAD  Head: [place]/AT, No temporalis wasting.  Ear/Nose/Throat: Hearing grossly intact, nares w/o erythema or drainage Eyes: PER, EOMI, sclera nonicteric.  Neck: Supple, no masses.  No JVD.  Pulmonary:  Good air movement, no use of accessory muscles.  Cardiac: RRR Vascular:  Swelling in left medial calf, palpable leg graft Vessel Right Left  Radial Palpable Palpable   Gastrointestinal: soft, non-distended. No guarding/no peritoneal signs.  Musculoskeletal: Patient ambulates with a walker.  No deformity or atrophy.  Neurologic: Pain and light touch intact in extremities.  Symmetrical.  Speech is fluent. Motor exam as listed above. Psychiatric: Judgment intact, Mood & affect appropriate for pt's clinical situation. Dermatologic: No Venous rashes. No Ulcers Noted.  No changes consistent with cellulitis. Lymph : No Cervical lymphadenopathy, no lichenification or skin changes of chronic lymphedema.       ASSESSMENT AND PLAN:  1. Bilateral popliteal artery aneurysm (HCC) Based upon the patient's imaging today there does not appear to be severe growth of the popliteal aneurysms.  The patient has some claudication-like symptoms however given the patient's age, pain more conservative treatment option is certainly warranted.  The duplex also revealed that the area of swelling is just displaced tissue from the large popliteal aneurysm.  The patient's leg swelling, the area gets bigger as it accumulates more fluid.  The patient is instructed to exercise daily, avoid salt intake as well as to elevate his lower extremities as much as possible.  The patient will follow-up with studies in 3 months.  2. Essential hypertension Continue antihypertensive medications as already ordered, these medications have been reviewed and there are no changes at this time.   3. Hypercholesteremia Continue statin as ordered and reviewed, no changes at this time    Current Outpatient Medications on File Prior to Visit   Medication Sig Dispense Refill  . acetaminophen (TYLENOL) 325 MG tablet Take 2 tablets (650 mg total) by mouth every 6 (six) hours as needed for mild pain (or Fever >/= 101).    Marland Kitchen albuterol (PROVENTIL HFA;VENTOLIN HFA) 108 (90 Base) MCG/ACT inhaler Inhale 2 puffs into the lungs every  6 (six) hours as needed for wheezing or shortness of breath. 1 Inhaler 2  . amLODipine (NORVASC) 2.5 MG tablet Take 2.5 mg by mouth daily.     Jearl Klinefelter ELLIPTA 62.5-25 MCG/INH AEPB Inhale 1 puff into the lungs 2 (two) times daily.    . benazepril (LOTENSIN) 20 MG tablet Take 1 tablet (20 mg total) by mouth daily. 30 tablet 0  . carvedilol (COREG) 3.125 MG tablet carvedilol 3.125 mg tablet    . ferrous sulfate 325 (65 FE) MG tablet Take 1 tablet (325 mg total) by mouth 2 (two) times daily with a meal. 60 tablet 3  . furosemide (LASIX) 20 MG tablet Take 1 tablet by mouth daily.    . hydrocortisone (ANUSOL-HC) 25 MG suppository Place 1 suppository (25 mg total) rectally 2 (two) times daily. 12 suppository 0  . metoprolol tartrate (LOPRESSOR) 25 MG tablet Take 1 tablet (25 mg total) by mouth 2 (two) times daily. 60 tablet 0  . pantoprazole (PROTONIX) 40 MG tablet Take 1 tablet (40 mg total) by mouth daily. 30 tablet 0  . polyethylene glycol (MIRALAX / GLYCOLAX) packet Take 17 g by mouth daily as needed for moderate constipation. 14 each 0  . senna-docusate (SENOKOT-S) 8.6-50 MG tablet Take 1 tablet by mouth 2 (two) times daily.    . simvastatin (ZOCOR) 20 MG tablet Take 20 mg by mouth daily.     . traMADol (ULTRAM) 50 MG tablet Take 1 tablet (50 mg total) by mouth every 6 (six) hours as needed for moderate pain or severe pain. 30 tablet 0  . traZODone (DESYREL) 50 MG tablet Take 1 tablet by mouth at bedtime as needed for sleep.     . vitamin B-12 1000 MCG tablet Take 1 tablet (1,000 mcg total) by mouth daily. 60 tablet 1  . clopidogrel (PLAVIX) 75 MG tablet clopidogrel 75 mg tablet    . feeding supplement, ENSURE ENLIVE,  (ENSURE ENLIVE) LIQD Take 237 mLs by mouth 2 (two) times daily between meals. 60 Bottle 1   Current Facility-Administered Medications on File Prior to Visit  Medication Dose Route Frequency Provider Last Rate Last Dose  . Darbepoetin Alfa (ARANESP) injection 100 mcg  100 mcg Subcutaneous Q28 days Sindy Guadeloupe, MD   100 mcg at 04/16/18 1505    There are no Patient Instructions on file for this visit. No follow-ups on file.   Kris Hartmann, NP  This note was completed with Sales executive.  Any errors are purely unintentional.

## 2019-08-25 NOTE — Progress Notes (Signed)
Called patient no answer left message  

## 2019-08-26 ENCOUNTER — Other Ambulatory Visit: Payer: Self-pay

## 2019-08-26 ENCOUNTER — Encounter: Payer: Self-pay | Admitting: Oncology

## 2019-08-26 ENCOUNTER — Inpatient Hospital Stay: Payer: Medicare HMO | Attending: Oncology | Admitting: Oncology

## 2019-08-26 ENCOUNTER — Inpatient Hospital Stay: Payer: Medicare HMO

## 2019-08-26 VITALS — BP 193/76 | HR 69 | Temp 97.6°F | Resp 18 | Wt 179.8 lb

## 2019-08-26 DIAGNOSIS — C61 Malignant neoplasm of prostate: Secondary | ICD-10-CM | POA: Diagnosis not present

## 2019-08-26 DIAGNOSIS — I10 Essential (primary) hypertension: Secondary | ICD-10-CM | POA: Diagnosis not present

## 2019-08-26 DIAGNOSIS — N189 Chronic kidney disease, unspecified: Secondary | ICD-10-CM | POA: Insufficient documentation

## 2019-08-26 DIAGNOSIS — Z79899 Other long term (current) drug therapy: Secondary | ICD-10-CM | POA: Insufficient documentation

## 2019-08-26 DIAGNOSIS — D631 Anemia in chronic kidney disease: Secondary | ICD-10-CM | POA: Diagnosis not present

## 2019-08-26 DIAGNOSIS — R54 Age-related physical debility: Secondary | ICD-10-CM | POA: Insufficient documentation

## 2019-08-26 DIAGNOSIS — I4891 Unspecified atrial fibrillation: Secondary | ICD-10-CM | POA: Insufficient documentation

## 2019-08-26 DIAGNOSIS — D509 Iron deficiency anemia, unspecified: Secondary | ICD-10-CM

## 2019-08-26 DIAGNOSIS — I130 Hypertensive heart and chronic kidney disease with heart failure and stage 1 through stage 4 chronic kidney disease, or unspecified chronic kidney disease: Secondary | ICD-10-CM | POA: Diagnosis not present

## 2019-08-26 LAB — COMPREHENSIVE METABOLIC PANEL
ALT: 46 U/L — ABNORMAL HIGH (ref 0–44)
AST: 38 U/L (ref 15–41)
Albumin: 3.8 g/dL (ref 3.5–5.0)
Alkaline Phosphatase: 59 U/L (ref 38–126)
Anion gap: 7 (ref 5–15)
BUN: 26 mg/dL — ABNORMAL HIGH (ref 8–23)
CO2: 28 mmol/L (ref 22–32)
Calcium: 9.5 mg/dL (ref 8.9–10.3)
Chloride: 102 mmol/L (ref 98–111)
Creatinine, Ser: 1.59 mg/dL — ABNORMAL HIGH (ref 0.61–1.24)
GFR calc Af Amer: 42 mL/min — ABNORMAL LOW (ref >60)
GFR calc non Af Amer: 37 mL/min — ABNORMAL LOW (ref >60)
Glucose, Bld: 98 mg/dL (ref 70–99)
Potassium: 4.5 mmol/L (ref 3.5–5.1)
Sodium: 137 mmol/L (ref 135–145)
Total Bilirubin: 0.3 mg/dL (ref 0.3–1.2)
Total Protein: 6.9 g/dL (ref 6.5–8.1)

## 2019-08-26 LAB — IRON AND TIBC
Iron: 32 ug/dL — ABNORMAL LOW (ref 45–182)
Saturation Ratios: 14 % — ABNORMAL LOW (ref 17.9–39.5)
TIBC: 228 ug/dL — ABNORMAL LOW (ref 250–450)
UIBC: 196 ug/dL

## 2019-08-26 LAB — CBC WITH DIFFERENTIAL/PLATELET
Abs Immature Granulocytes: 0.05 10*3/uL (ref 0.00–0.07)
Basophils Absolute: 0.1 10*3/uL (ref 0.0–0.1)
Basophils Relative: 1 %
Eosinophils Absolute: 0.3 10*3/uL (ref 0.0–0.5)
Eosinophils Relative: 3 %
HCT: 31.3 % — ABNORMAL LOW (ref 39.0–52.0)
Hemoglobin: 9.9 g/dL — ABNORMAL LOW (ref 13.0–17.0)
Immature Granulocytes: 1 %
Lymphocytes Relative: 17 %
Lymphs Abs: 1.8 10*3/uL (ref 0.7–4.0)
MCH: 28 pg (ref 26.0–34.0)
MCHC: 31.6 g/dL (ref 30.0–36.0)
MCV: 88.7 fL (ref 80.0–100.0)
Monocytes Absolute: 0.7 10*3/uL (ref 0.1–1.0)
Monocytes Relative: 7 %
Neutro Abs: 7.3 10*3/uL (ref 1.7–7.7)
Neutrophils Relative %: 71 %
Platelets: 188 10*3/uL (ref 150–400)
RBC: 3.53 MIL/uL — ABNORMAL LOW (ref 4.22–5.81)
RDW: 15.7 % — ABNORMAL HIGH (ref 11.5–15.5)
WBC: 10.3 10*3/uL (ref 4.0–10.5)
nRBC: 0 % (ref 0.0–0.2)

## 2019-08-26 LAB — FERRITIN: Ferritin: 389 ng/mL — ABNORMAL HIGH (ref 24–336)

## 2019-08-26 NOTE — Progress Notes (Signed)
Pt here for follow up. Complains of pain in left leg.

## 2019-08-29 NOTE — Progress Notes (Signed)
Hematology/Oncology Consult note [facility]  Telephone:([phone] [phone]  Patient Care Team: Albina Billet, MD as PCP - General (Internal Medicine)   Name of the patient: Devin Washington  [id number]  1923/12/06   Date of visit: 08/29/19  Diagnosis- anemia likely multifactorial- iron and b12 deficiency and anemia of chronic kidney disease  Chief complaint/ Reason for visit-routine follow-up of anemia  Heme/Onc history: patient is a 85 year old male with a past medical history significant for atrial fibrillation, CHF, hypertension and prostate cancer who presented to the ER after he suffered a fall. On admission he was found to have H&H of 7.1/22.2 with an MCV of 80.1 and a platelet count of 268.BMP showed an elevated creatinine of 1.9. TSH was normal at 0.52.Patient received 1 unit of PRBC in the ER. Ferritin checked per day later was 152. Iron studies revealed low serum iron of 27, low TIBC of 229 and low iron saturation of 12%. B12 levels were low normal at 264. Folate level was normal at 17.5. Reticulocyte count was low at 2% for the degree of anemia.Coombs test was negative and haptoglobin was normal. LDH was mildly elevated at 228. Myeloma panel showed M protein of IgG kappa 0.5 g. Both kappa and lambda free light chains were elevated with a normal free light chain ratio 0.77. E Po level was 22.3  Patient underwent EGD and colonoscopy on 04/04/2018. EGD showed nonobstructing nonbleeding superficial duodenal ulcers with a clean ulcer base. There are nonbleeding erosions in the gastric antrum andno stigmata of recent bleeding.Duodenum were suspicious for H. pylori.Colonoscopy showed hemorrhoids and 7 mm cecalpolyp  Interval history-patient is doing well over the last 6 months.  Denies any falls.  He continues to remain independent of his ADLs.  ECOG PS- 2 Pain scale- 0   Review of systems- Review of Systems    Constitutional: Positive for malaise/fatigue. Negative for chills, fever and weight loss.  HENT: Negative for congestion, ear discharge and nosebleeds.   Eyes: Negative for blurred vision.  Respiratory: Negative for cough, hemoptysis, sputum production, shortness of breath and wheezing.   Cardiovascular: Negative for chest pain, palpitations, orthopnea and claudication.  Gastrointestinal: Negative for abdominal pain, blood in stool, constipation, diarrhea, heartburn, melena, nausea and vomiting.  Genitourinary: Negative for dysuria, flank pain, frequency, hematuria and urgency.  Musculoskeletal: Negative for back pain, joint pain and myalgias.  Skin: Negative for rash.  Neurological: Negative for dizziness, tingling, focal weakness, seizures, weakness and headaches.  Endo/Heme/Allergies: Does not bruise/bleed easily.  Psychiatric/Behavioral: Negative for depression and suicidal ideas. The patient does not have insomnia.       No Known Allergies   Past Medical History:  Diagnosis Date   A-fib ([place])    Cardiomyopathy ([place])    CHF (congestive heart failure) (HCC)    DJD (degenerative joint disease)    Duodenal ulcer 08/09/2015   Dysrhythmia    Erosive esophagitis    HH (hiatus hernia)    HLD (hyperlipidemia)    Hypertension    Pacemaker    PAD (peripheral artery disease) (HCC)    Peripheral vascular disease (HCC)    Presence of permanent cardiac pacemaker    Prostate cancer ([place])    Prostatectomy.   Rectal varices 08/09/2015   Shortness of breath    SSS (sick sinus syndrome) [facility])      Past Surgical History:  Procedure Laterality Date   CATARACT EXTRACTION, BILATERAL     COLONOSCOPY N/A 04/04/2018   Procedure:  COLONOSCOPY;  Surgeon: Lin Landsman, MD;  Location: [facility] ENDOSCOPY;  Service: Gastroenterology;  Laterality: N/A;   ESOPHAGOGASTRODUODENOSCOPY N/A 08/09/2015   Procedure: ESOPHAGOGASTRODUODENOSCOPY (EGD) looking in the stomach with a  lighted tube to evaluate and treat;  Surgeon: Lollie Sails, MD;  Location: [facility] ENDOSCOPY;  Service: Endoscopy;  Laterality: N/A;  Procedure to be done tomorrow afternoon, 08/08/2015. Awaiting cardiology consult   ESOPHAGOGASTRODUODENOSCOPY N/A 04/04/2018   Procedure: ESOPHAGOGASTRODUODENOSCOPY (EGD);  Surgeon: Lin Landsman, MD;  Location: [facility] ENDOSCOPY;  Service: Gastroenterology;  Laterality: N/A;   EYE SURGERY     FEMORAL-POPLITEAL BYPASS GRAFT Left 12/16/2017   Procedure: BYPASS GRAFT FEMORAL-POPLITEAL ARTERY ( OPEN POPLITEAL BYPASS );  Surgeon: Algernon Huxley, MD;  Location: [facility] ORS;  Service: Vascular;  Laterality: Left;   FLEXIBLE SIGMOIDOSCOPY N/A 08/09/2015   Procedure: FLEXIBLE SIGMOIDOSCOPY looking up the rectum into the distal colon with a lighted tube to examine and treat;  Surgeon: Lollie Sails, MD;  Location: [facility] ENDOSCOPY;  Service: Endoscopy;  Laterality: N/A;  Procedure to be done tomorrow afternoon, 08/08/2015. Awaiting cardiology consult.   INSERT / REPLACE / REMOVE PACEMAKER     JOINT REPLACEMENT Left    left knee replacement   LOWER EXTREMITY ANGIOGRAPHY Left 11/16/2017   Procedure: LOWER EXTREMITY ANGIOGRAPHY;  Surgeon: Algernon Huxley, MD;  Location: [place] CV LAB;  Service: Cardiovascular;  Laterality: Left;   LOWER EXTREMITY ANGIOGRAPHY Right 11/30/2017   Procedure: LOWER EXTREMITY ANGIOGRAPHY;  Surgeon: Algernon Huxley, MD;  Location: [place] CV LAB;  Service: Cardiovascular;  Laterality: Right;   LOWER EXTREMITY ANGIOGRAPHY Left 03/04/2018   Procedure: LOWER EXTREMITY ANGIOGRAPHY;  Surgeon: Algernon Huxley, MD;  Location: [place] CV LAB;  Service: Cardiovascular;  Laterality: Left;   LOWER EXTREMITY ANGIOGRAPHY Left 04/12/2018   Procedure: LOWER EXTREMITY ANGIOGRAPHY;  Surgeon: Algernon Huxley, MD;  Location: [place] CV LAB;  Service: Cardiovascular;  Laterality: Left;   PACEMAKER INSERTION     PROSTATE SURGERY     PROSTATE SURGERY       Social History   Socioeconomic History   Marital status: Widowed    Spouse name: Not on file   Number of children: Not on file   Years of education: Not on file   Highest education level: Not on file  Occupational History    Employer: RETIRED/[employer] [place] resource strain: Not on file   Food insecurity    Worry: Not on file    Inability: Not on file   Transportation needs    Medical: Not on file    Non-medical: Not on file  Tobacco Use   Smoking status: Former Smoker    Quit date: 03/02/1958    Years since quitting: 61.5   Smokeless tobacco: Former Systems developer    Types: Chew, Snuff  Substance and Sexual Activity   Alcohol use: No    Alcohol/week: 0.0 standard drinks   Drug use: No   Sexual activity: Not Currently  Lifestyle   Physical activity    Days per week: Not on file    Minutes per session: Not on file   Stress: Not on file  Relationships   Social connections    Talks on phone: Not on file    Gets together: Not on file    Attends religious service: Not on file    Active member of club or organization: Not on file    Attends meetings of clubs or  organizations: Not on file    Relationship status: Not on file   Intimate partner violence    Fear of current or ex partner: Not on file    Emotionally abused: Not on file    Physically abused: Not on file    Forced sexual activity: Not on file  Other Topics Concern   Not on file  Social History Narrative   Not on file    Family History  Problem Relation Age of Onset   Lung cancer Father      Current Outpatient Medications:    albuterol (PROVENTIL HFA;VENTOLIN HFA) 108 (90 Base) MCG/ACT inhaler, Inhale 2 puffs into the lungs every 6 (six) hours as needed for wheezing or shortness of breath., Disp: 1 Inhaler, Rfl: 2   amLODipine (NORVASC) 10 MG tablet, Take 10 mg by mouth daily. , Disp: , Rfl:    ANORO ELLIPTA 62.5-25 MCG/INH AEPB, Inhale 1 puff into the  lungs 2 (two) times daily., Disp: , Rfl:    benazepril (LOTENSIN) 20 MG tablet, Take 1 tablet (20 mg total) by mouth daily., Disp: 30 tablet, Rfl: 0   ferrous sulfate 325 (65 FE) MG tablet, Take 1 tablet (325 mg total) by mouth 2 (two) times daily with a meal. (Patient taking differently: Take 325 mg by mouth daily with breakfast. ), Disp: 60 tablet, Rfl: 3   furosemide (LASIX) 20 MG tablet, Take 1 tablet by mouth daily., Disp: , Rfl:    metoprolol tartrate (LOPRESSOR) 25 MG tablet, Take 1 tablet (25 mg total) by mouth 2 (two) times daily., Disp: 60 tablet, Rfl: 0   simvastatin (ZOCOR) 20 MG tablet, Take 20 mg by mouth daily. , Disp: , Rfl:    traMADol (ULTRAM) 50 MG tablet, Take 1 tablet (50 mg total) by mouth every 6 (six) hours as needed for moderate pain or severe pain., Disp: 30 tablet, Rfl: 0   traZODone (DESYREL) 50 MG tablet, Take 1 tablet by mouth at bedtime as needed for sleep. , Disp: , Rfl:    vitamin B-12 1000 MCG tablet, Take 1 tablet (1,000 mcg total) by mouth daily., Disp: 60 tablet, Rfl: 1 No current facility-administered medications for this visit.   Facility-Administered Medications Ordered in Other Visits:    Darbepoetin Alfa (ARANESP) injection 100 mcg, 100 mcg, Subcutaneous, Q28 days, Sindy Guadeloupe, MD, 100 mcg at 04/16/18 1505  Physical exam:  Vitals:   08/26/19 1443  BP: (!) 193/76  Pulse: 69  Resp: 18  Temp: 97.6 F (36.4 C)  TempSrc: Tympanic  Weight: 179 lb 12.8 oz (81.6 kg)   Physical Exam Constitutional:      Comments: Elderly gentleman sitting in a wheelchair.  Appears in no acute distress  HENT:     Head: Normocephalic and atraumatic.  Eyes:     Pupils: Pupils are equal, round, and reactive to light.  Neck:     Musculoskeletal: Normal range of motion.  Cardiovascular:     Rate and Rhythm: Normal rate and regular rhythm.     Heart sounds: Murmur (Systolic murmur) present.  Pulmonary:     Effort: Pulmonary effort is normal.     Breath  sounds: Normal breath sounds.  Abdominal:     General: Bowel sounds are normal.     Palpations: Abdomen is soft.  Skin:    General: Skin is warm and dry.  Neurological:     Mental Status: He is alert and oriented to person, place, and time.  CMP Latest Ref Rng & Units 08/26/2019  Glucose 70 - 99 mg/dL 98  BUN 8 - 23 mg/dL 26(H)  Creatinine 0.61 - 1.24 mg/dL 1.59(H)  Sodium 135 - 145 mmol/L 137  Potassium 3.5 - 5.1 mmol/L 4.5  Chloride 98 - 111 mmol/L 102  CO2 22 - 32 mmol/L 28  Calcium 8.9 - 10.3 mg/dL 9.5  Total Protein 6.5 - 8.1 g/dL 6.9  Total Bilirubin 0.3 - 1.2 mg/dL 0.3  Alkaline Phos 38 - 126 U/L 59  AST 15 - 41 U/L 38  ALT 0 - 44 U/L 46(H)   CBC Latest Ref Rng & Units 08/26/2019  WBC 4.0 - 10.5 K/uL 10.3  Hemoglobin 13.0 - 17.0 g/dL 9.9(L)  Hematocrit 39.0 - 52.0 % 31.3(L)  Platelets 150 - 400 K/uL 188      Assessment and plan- Patient is a 85 y.o. male with normocytic anemia likely multifactorial combination of iron deficiency as well as anemia of chronic kidney disease  Patient's hemoglobin is stable around 9.9.  He does not have any evidence of iron deficiency at present.  I suspect his anemia partly likely secondary to his chronic kidney disease.  I will hold off on giving him EPO at this time as his hemoglobin is close to 10.  I will see him back in 6 months time with a CBC with differential  Uncontrolled hypertension: I have asked him to follow-up with Dr. Hall Busing regarding this. Given his age and frailty I am not sending him to the ER at this time since he is asymptomatic   Visit Diagnosis 1. Anemia due to chronic kidney disease, unspecified CKD stage   2. Uncontrolled hypertension      Dr. Randa Evens, MD, MPH [facility] [phone] 08/29/2019 1:36 PM

## 2019-09-23 DIAGNOSIS — R69 Illness, unspecified: Secondary | ICD-10-CM | POA: Diagnosis not present

## 2019-11-08 ENCOUNTER — Other Ambulatory Visit (INDEPENDENT_AMBULATORY_CARE_PROVIDER_SITE_OTHER): Payer: Self-pay | Admitting: Vascular Surgery

## 2019-11-08 ENCOUNTER — Encounter (INDEPENDENT_AMBULATORY_CARE_PROVIDER_SITE_OTHER): Payer: Medicare HMO

## 2019-11-08 DIAGNOSIS — I724 Aneurysm of artery of lower extremity: Secondary | ICD-10-CM

## 2019-11-15 ENCOUNTER — Encounter (INDEPENDENT_AMBULATORY_CARE_PROVIDER_SITE_OTHER): Payer: Medicare HMO

## 2019-11-15 ENCOUNTER — Ambulatory Visit (INDEPENDENT_AMBULATORY_CARE_PROVIDER_SITE_OTHER): Payer: Medicare HMO | Admitting: Vascular Surgery

## 2019-12-14 ENCOUNTER — Encounter (INDEPENDENT_AMBULATORY_CARE_PROVIDER_SITE_OTHER): Payer: Medicare HMO

## 2019-12-14 ENCOUNTER — Ambulatory Visit (INDEPENDENT_AMBULATORY_CARE_PROVIDER_SITE_OTHER): Payer: Medicare HMO | Admitting: Nurse Practitioner

## 2019-12-21 DIAGNOSIS — N2581 Secondary hyperparathyroidism of renal origin: Secondary | ICD-10-CM | POA: Insufficient documentation

## 2019-12-21 DIAGNOSIS — D631 Anemia in chronic kidney disease: Secondary | ICD-10-CM | POA: Insufficient documentation

## 2019-12-21 DIAGNOSIS — N189 Chronic kidney disease, unspecified: Secondary | ICD-10-CM | POA: Insufficient documentation

## 2019-12-21 DIAGNOSIS — I129 Hypertensive chronic kidney disease with stage 1 through stage 4 chronic kidney disease, or unspecified chronic kidney disease: Secondary | ICD-10-CM | POA: Insufficient documentation

## 2019-12-21 DIAGNOSIS — R809 Proteinuria, unspecified: Secondary | ICD-10-CM | POA: Insufficient documentation

## 2020-01-04 ENCOUNTER — Ambulatory Visit (INDEPENDENT_AMBULATORY_CARE_PROVIDER_SITE_OTHER): Payer: Medicare HMO | Admitting: Nurse Practitioner

## 2020-01-04 ENCOUNTER — Ambulatory Visit (INDEPENDENT_AMBULATORY_CARE_PROVIDER_SITE_OTHER): Payer: Medicare HMO

## 2020-01-04 ENCOUNTER — Encounter (INDEPENDENT_AMBULATORY_CARE_PROVIDER_SITE_OTHER): Payer: Self-pay | Admitting: Nurse Practitioner

## 2020-01-04 ENCOUNTER — Other Ambulatory Visit: Payer: Self-pay

## 2020-01-04 VITALS — BP 180/80 | HR 74 | Resp 12 | Ht 68.0 in | Wt 176.0 lb

## 2020-01-04 DIAGNOSIS — I724 Aneurysm of artery of lower extremity: Secondary | ICD-10-CM | POA: Diagnosis not present

## 2020-01-04 DIAGNOSIS — I739 Peripheral vascular disease, unspecified: Secondary | ICD-10-CM | POA: Diagnosis not present

## 2020-01-04 DIAGNOSIS — I723 Aneurysm of iliac artery: Secondary | ICD-10-CM | POA: Diagnosis not present

## 2020-01-09 ENCOUNTER — Encounter (INDEPENDENT_AMBULATORY_CARE_PROVIDER_SITE_OTHER): Payer: Self-pay | Admitting: Nurse Practitioner

## 2020-01-09 DIAGNOSIS — I739 Peripheral vascular disease, unspecified: Secondary | ICD-10-CM | POA: Insufficient documentation

## 2020-01-09 NOTE — Progress Notes (Signed)
SUBJECTIVE:  Patient ID: Devin Washington, male    DOB: 07-28-1923, 86 y.o.   MRN: [id number] Chief Complaint  Patient presents with  . Follow-up    U/S Follow up    HPI  Devin Washington is a 86 y.o. male presents today for follow-up studies related to his peripheral artery disease in addition to his popliteal aneurysms.  Patient states that he has been in his usual state of health without any issues with ambulation or worsening pain in the lower extremities.  He denies any lower extremity wounds or ulcerations.  He denies any rest pain like symptoms.  He denies any chest pain or shortness of breath.  The patient does not ambulate much but overall feels okay.  Today the patient underwent bilateral ABIs.  His ABIs are noncompressible but a right TBI 1.04 and a left of 0.98.  The patient underwent an aortoiliac study which reveals a aortic abdominal aneurysm of 3.1 cm.  This is consistent with the previous exam done on 05/06/2019.  The patient does have an abnormal dilatation of the right common iliac artery of 2.8 cm and this again is consistent with the previous studies.  Patient also underwent a bilateral lower extremity arterial duplex which reveals biphasic waveforms down to the proximal SFA in the right lower extremity where it transitions to monophasic waveforms through the right lower extremity.  All previously placed stents are patent.  The distal anterior tibial artery is occluded the patient's left lower extremity has biphasic waveforms throughout the lower extremity in addition to the graft that was previously placed.  The right popliteal artery aneurysm measures 2.8 cm x 4.1 cm.  The left popliteal aneurysm measures 4.1 cm x 4.2 cm.  Aneurysm showed no flow with no endoleak.  Past Medical History:  Diagnosis Date  . A-fib ([place])   . Cardiomyopathy ([place])   . CHF (congestive heart failure) ([place])   . DJD (degenerative joint disease)   . Duodenal ulcer 08/09/2015  . Dysrhythmia   .  Erosive esophagitis   . HH (hiatus hernia)   . HLD (hyperlipidemia)   . Hypertension   . Pacemaker   . PAD (peripheral artery disease) ([place])   . Peripheral vascular disease ([place])   . Presence of permanent cardiac pacemaker   . Prostate cancer [facility])    Prostatectomy.  . 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 Shortness of breath   . SSS (sick sinus syndrome) [facility])     Past Surgical History:  Procedure Laterality Date  . CATARACT EXTRACTION, BILATERAL    . COLONOSCOPY N/A 04/04/2018   Procedure: COLONOSCOPY;  Surgeon: Lin Landsman, MD;  Location: [facility] ENDOSCOPY;  Service: Gastroenterology;  Laterality: N/A;  . ESOPHAGOGASTRODUODENOSCOPY N/A 08/09/2015   Procedure: ESOPHAGOGASTRODUODENOSCOPY (EGD) looking in the stomach with a lighted tube to evaluate and treat;  Surgeon: Lollie Sails, MD;  Location: [facility] ENDOSCOPY;  Service: Endoscopy;  Laterality: N/A;  Procedure to be done tomorrow afternoon, 08/08/2015. Awaiting cardiology consult  . ESOPHAGOGASTRODUODENOSCOPY N/A 04/04/2018   Procedure: ESOPHAGOGASTRODUODENOSCOPY (EGD);  Surgeon: Lin Landsman, MD;  Location: [facility] ENDOSCOPY;  Service: Gastroenterology;  Laterality: N/A;  . EYE SURGERY    . FEMORAL-POPLITEAL BYPASS GRAFT Left 12/16/2017   Procedure: BYPASS GRAFT FEMORAL-POPLITEAL ARTERY ( OPEN POPLITEAL BYPASS );  Surgeon: Algernon Huxley, MD;  Location: [facility] ORS;  Service: Vascular;  Laterality: Left;  . FLEXIBLE SIGMOIDOSCOPY N/A 08/09/2015   Procedure: FLEXIBLE SIGMOIDOSCOPY looking up the rectum into the distal colon with  a lighted tube to examine and treat;  Surgeon: Lollie Sails, MD;  Location: [facility] ENDOSCOPY;  Service: Endoscopy;  Laterality: N/A;  Procedure to be done tomorrow afternoon, 08/08/2015. Awaiting cardiology consult.  . INSERT / REPLACE / REMOVE PACEMAKER    . JOINT REPLACEMENT Left    left knee replacement  . LOWER EXTREMITY ANGIOGRAPHY Left 11/16/2017   Procedure: LOWER EXTREMITY ANGIOGRAPHY;  Surgeon: Algernon Huxley, MD;  Location: [place] CV LAB;  Service: Cardiovascular;  Laterality: Left;  . LOWER EXTREMITY ANGIOGRAPHY Right 11/30/2017   Procedure: LOWER EXTREMITY ANGIOGRAPHY;  Surgeon: Algernon Huxley, MD;  Location: [place] CV LAB;  Service: Cardiovascular;  Laterality: Right;  . LOWER EXTREMITY ANGIOGRAPHY Left 03/04/2018   Procedure: LOWER EXTREMITY ANGIOGRAPHY;  Surgeon: Algernon Huxley, MD;  Location: [place] CV LAB;  Service: Cardiovascular;  Laterality: Left;  . LOWER EXTREMITY ANGIOGRAPHY Left 04/12/2018   Procedure: LOWER EXTREMITY ANGIOGRAPHY;  Surgeon: Algernon Huxley, MD;  Location: [employer] CV LAB;  Service: Cardiovascular;  Laterality: Left;  . PACEMAKER INSERTION    . PROSTATE SURGERY    . PROSTATE SURGERY      Social History   Socioeconomic History  . Marital status: Widowed    Spouse name: Not on file  . Number of children: Not on file  . Years of education: Not on file  . Highest education level: Not on file  Occupational History    Employer: RETIRED/[employer]  Tobacco Use  . Smoking status: Former Smoker    Quit date: 03/02/1958    Years since quitting: 61.8  . Smokeless tobacco: Former Systems developer    Types: Chew, Snuff  Substance and Sexual Activity  . Alcohol use: No    Alcohol/week: 0.0 standard drinks  . Drug use: No  . Sexual activity: Not Currently  Other Topics Concern  . Not on file  Social History Narrative  . Not on file   Social Determinants of Health   Financial Resource Strain:   . Difficulty of Paying Living Expenses: Not on file  Food Insecurity:   . Worried About Charity fundraiser in the Last Year: Not on file  . Ran Out of Food in the Last Year: Not on file  Transportation Needs:   . Lack of Transportation (Medical): Not on file  . Lack of Transportation (Non-Medical): Not on file  Physical Activity:   . Days of Exercise per Week: Not on file  . Minutes of Exercise per Session: Not on file  Stress:   . Feeling  of Stress : Not on file  Social Connections:   . Frequency of Communication with Friends and Family: Not on file  . Frequency of Social Gatherings with Friends and Family: Not on file  . Attends Religious Services: Not on file  . Active Member of Clubs or Organizations: Not on file  . Attends Archivist Meetings: Not on file  . Marital Status: Not on file  Intimate Partner Violence:   . Fear of Current or Ex-Partner: Not on file  . Emotionally Abused: Not on file  . Physically Abused: Not on file  . Sexually Abused: Not on file    Family History  Problem Relation Age of Onset  . Lung cancer Father     No Known Allergies   Review of Systems   Review of Systems: Negative Unless Checked Constitutional: [] Weight loss  [] Fever  [] Chills Cardiac: [] Chest pain   []  Atrial Fibrillation  []   Palpitations   [] Shortness of breath when laying flat   [] Shortness of breath with exertion. [] Shortness of breath at rest Vascular:  [] Pain in legs with walking   [] Pain in legs with standing [] Pain in legs when laying flat   [] Claudication    [] Pain in feet when laying flat    [] History of DVT   [] Phlebitis   [] Swelling in legs   [] Varicose veins   [] Non-healing ulcers Pulmonary:   [] Uses home oxygen   [] Productive cough   [] Hemoptysis   [] Wheeze  [] COPD   [] Asthma Neurologic:  [] Dizziness   [] Seizures  [] Blackouts [] History of stroke   [] History of TIA  [] Aphasia   [] Temporary Blindness   [] Weakness or numbness in arm   [x] Weakness or numbness in leg Musculoskeletal:   [] Joint swelling   [] Joint pain   [] Low back pain  []  History of Knee Replacement [] Arthritis [] back Surgeries  []  Spinal Stenosis    Hematologic:  [] Easy bruising  [] Easy bleeding   [] Hypercoagulable state   [x] Anemic Gastrointestinal:  [] Diarrhea   [] Vomiting  [] Gastroesophageal reflux/heartburn   [] Difficulty swallowing. [] Abdominal pain Genitourinary:  [x] Chronic kidney disease   [] Difficult urination  [] Anuric   [] Blood in  urine [] Frequent urination  [] Burning with urination   [] Hematuria Skin:  [] Rashes   [] Ulcers [] Wounds Psychological:  [] History of anxiety   []  History of major depression  []  Memory Difficulties      OBJECTIVE:   Physical Exam  BP (!) 180/80 (BP Location: Left Arm)   Pulse 74   Resp 12   Ht 5\' 8"  (1.727 m)   Wt 176 lb (79.8 kg)   BMI 26.76 kg/m   Gen: WD/WN, NAD Head: [place]/AT, No temporalis wasting.  Ear/Nose/Throat: Hearing grossly intact, nares w/o erythema or drainage Eyes: PER, EOMI, sclera nonicteric.  Neck: Supple, no masses.  No JVD.  Pulmonary:  Good air movement, no use of accessory muscles.  Cardiac: RRR Vascular:  Vessel Right Left  Dorsalis Pedis Not Palpable Not Palpable  Posterior Tibial Not Palpable Not Palpable   Gastrointestinal: soft, non-distended. No guarding/no peritoneal signs.  Musculoskeletal: M/S 5/5 throughout.  No deformity or atrophy.  Neurologic: Pain and light touch intact in extremities.  Symmetrical.  Speech is fluent. Motor exam as listed above. Psychiatric: Judgment intact, Mood & affect appropriate for pt's clinical situation.       ASSESSMENT AND PLAN:  1. Bilateral popliteal artery aneurysm [facility]) Patient has a very large popliteal artery aneurysm on the left which is actually palpable due to its size.  However there is no indication of any leak seen on ultrasound.  We will continue to follow-up and monitor with noninvasive studies every 6 months.  2. Peripheral artery disease ([place]) Patient currently not complaining of any worsening signs and symptoms of peripheral artery disease such as worsening claudication rest pain or lower extremity ulcerations.  Based on this we will continue with 67-month follow-ups or sooner if new issues develop.   Current Outpatient Medications on File Prior to Visit  Medication Sig Dispense Refill  . acetaminophen (TYLENOL) 325 MG tablet Take by mouth.    Marland Kitchen albuterol (PROVENTIL HFA;VENTOLIN HFA) 108 (90  Base) MCG/ACT inhaler Inhale 2 puffs into the lungs every 6 (six) hours as needed for wheezing or shortness of breath. 1 Inhaler 2  . amLODipine (NORVASC) 10 MG tablet Take 10 mg by mouth daily.     Marland Kitchen amLODipine (NORVASC) 10 MG tablet     . ANORO ELLIPTA 62.5-25 MCG/INH  AEPB Inhale 1 puff into the lungs 2 (two) times daily.    Marland Kitchen aspirin 81 MG EC tablet Take by mouth.    . benazepril (LOTENSIN) 20 MG tablet Take 1 tablet (20 mg total) by mouth daily. 30 tablet 0  . feeding supplement, ENSURE ENLIVE, (ENSURE ENLIVE) LIQD Take by mouth.    . ferrous sulfate 325 (65 FE) MG tablet Take 1 tablet (325 mg total) by mouth 2 (two) times daily with a meal. (Patient taking differently: Take 325 mg by mouth daily with breakfast. ) 60 tablet 3  . furosemide (LASIX) 20 MG tablet Take 1 tablet by mouth daily.    . metoprolol tartrate (LOPRESSOR) 25 MG tablet Take 1 tablet (25 mg total) by mouth 2 (two) times daily. 60 tablet 0  . polyethylene glycol powder (GLYCOLAX/MIRALAX) 17 GM/SCOOP powder Take by mouth.    . simvastatin (ZOCOR) 20 MG tablet Take 20 mg by mouth daily.     . traMADol (ULTRAM) 50 MG tablet Take 1 tablet (50 mg total) by mouth every 6 (six) hours as needed for moderate pain or severe pain. 30 tablet 0  . traZODone (DESYREL) 50 MG tablet Take 1 tablet by mouth at bedtime as needed for sleep.     . vitamin B-12 1000 MCG tablet Take 1 tablet (1,000 mcg total) by mouth daily. 60 tablet 1  . diphenhydrAMINE (BENADRYL) 25 mg capsule Take by mouth.     Current Facility-Administered Medications on File Prior to Visit  Medication Dose Route Frequency Provider Last Rate Last Admin  . Darbepoetin Alfa (ARANESP) injection 100 mcg  100 mcg Subcutaneous Q28 days Sindy Guadeloupe, MD   100 mcg at 04/16/18 1505    There are no Patient Instructions on file for this visit. No follow-ups on file.   Kris Hartmann, NP  This note was completed with Sales executive.  Any errors are purely unintentional.

## 2020-01-27 ENCOUNTER — Other Ambulatory Visit: Payer: Self-pay

## 2020-01-27 ENCOUNTER — Ambulatory Visit: Payer: Medicare HMO | Attending: Internal Medicine

## 2020-01-27 DIAGNOSIS — Z23 Encounter for immunization: Secondary | ICD-10-CM | POA: Insufficient documentation

## 2020-01-27 NOTE — Progress Notes (Signed)
   Covid-19 Vaccination Clinic  Name:  Devin Washington    MRN: [id number] DOB: 29-Jan-1923  01/27/2020  Mr. Driggers was observed post Covid-19 immunization for 15 minutes without incidence. He was provided with Vaccine Information Sheet and instruction to access the V-Safe system.   Mr. Gieselman was instructed to call 911 with any severe reactions post vaccine: Marland Kitchen Difficulty breathing  . Swelling of your face and throat  . A fast heartbeat  . A bad rash all over your body  . Dizziness and weakness    Immunizations Administered    Name Date Dose VIS Date Route   Moderna COVID-19 Vaccine 01/27/2020 11:12 AM 0.5 mL 11/15/2019 Intramuscular   Manufacturer: Moderna   Lot: [id number]   [place]: [id number]

## 2020-02-27 ENCOUNTER — Ambulatory Visit: Payer: Medicare HMO | Attending: Internal Medicine

## 2020-02-27 DIAGNOSIS — Z23 Encounter for immunization: Secondary | ICD-10-CM

## 2020-02-27 NOTE — Progress Notes (Signed)
   Covid-19 Vaccination Clinic  Name:  Devin Washington    MRN: [id number] DOB: 11/11/1923  02/27/2020  Devin Washington was observed post Covid-19 immunization for 15 minutes without incident. He was provided with Vaccine Information Sheet and instruction to access the V-Safe system.   Devin Washington was instructed to call 911 with any severe reactions post vaccine: Marland Kitchen Difficulty breathing  . Swelling of face and throat  . A fast heartbeat  . A bad rash all over body  . Dizziness and weakness   Immunizations Administered    Name Date Dose VIS Date Route   Moderna COVID-19 Vaccine 02/27/2020 11:00 AM 0.5 mL 11/15/2019 Intramuscular   Manufacturer: Moderna   Lot: [id number]   [place]: [id number]

## 2020-02-28 ENCOUNTER — Ambulatory Visit (INDEPENDENT_AMBULATORY_CARE_PROVIDER_SITE_OTHER): Payer: Medicare HMO | Admitting: Vascular Surgery

## 2020-02-28 ENCOUNTER — Encounter (INDEPENDENT_AMBULATORY_CARE_PROVIDER_SITE_OTHER): Payer: Medicare HMO

## 2020-03-01 ENCOUNTER — Inpatient Hospital Stay: Payer: Medicare HMO | Admitting: Oncology

## 2020-03-01 ENCOUNTER — Inpatient Hospital Stay: Payer: Medicare HMO

## 2020-03-06 ENCOUNTER — Inpatient Hospital Stay: Payer: Medicare HMO | Admitting: Oncology

## 2020-03-06 ENCOUNTER — Inpatient Hospital Stay: Payer: Medicare HMO

## 2020-03-13 ENCOUNTER — Inpatient Hospital Stay (HOSPITAL_BASED_OUTPATIENT_CLINIC_OR_DEPARTMENT_OTHER): Payer: Medicare HMO | Admitting: Oncology

## 2020-03-13 ENCOUNTER — Other Ambulatory Visit: Payer: Self-pay

## 2020-03-13 ENCOUNTER — Inpatient Hospital Stay: Payer: Medicare HMO | Attending: Oncology

## 2020-03-13 ENCOUNTER — Encounter: Payer: Self-pay | Admitting: Oncology

## 2020-03-13 VITALS — BP 161/90 | HR 74 | Temp 97.8°F | Ht 70.0 in | Wt 179.0 lb

## 2020-03-13 DIAGNOSIS — N189 Chronic kidney disease, unspecified: Secondary | ICD-10-CM

## 2020-03-13 DIAGNOSIS — Z79899 Other long term (current) drug therapy: Secondary | ICD-10-CM | POA: Insufficient documentation

## 2020-03-13 DIAGNOSIS — Z87891 Personal history of nicotine dependence: Secondary | ICD-10-CM | POA: Insufficient documentation

## 2020-03-13 DIAGNOSIS — I4891 Unspecified atrial fibrillation: Secondary | ICD-10-CM | POA: Diagnosis not present

## 2020-03-13 DIAGNOSIS — D509 Iron deficiency anemia, unspecified: Secondary | ICD-10-CM

## 2020-03-13 DIAGNOSIS — E538 Deficiency of other specified B group vitamins: Secondary | ICD-10-CM | POA: Diagnosis not present

## 2020-03-13 DIAGNOSIS — I509 Heart failure, unspecified: Secondary | ICD-10-CM | POA: Diagnosis not present

## 2020-03-13 DIAGNOSIS — Z8546 Personal history of malignant neoplasm of prostate: Secondary | ICD-10-CM | POA: Diagnosis not present

## 2020-03-13 DIAGNOSIS — D631 Anemia in chronic kidney disease: Secondary | ICD-10-CM

## 2020-03-13 DIAGNOSIS — I130 Hypertensive heart and chronic kidney disease with heart failure and stage 1 through stage 4 chronic kidney disease, or unspecified chronic kidney disease: Secondary | ICD-10-CM | POA: Insufficient documentation

## 2020-03-13 LAB — COMPREHENSIVE METABOLIC PANEL
ALT: 36 U/L (ref 0–44)
AST: 22 U/L (ref 15–41)
Albumin: 3.5 g/dL (ref 3.5–5.0)
Alkaline Phosphatase: 68 U/L (ref 38–126)
Anion gap: 7 (ref 5–15)
BUN: 29 mg/dL — ABNORMAL HIGH (ref 8–23)
CO2: 26 mmol/L (ref 22–32)
Calcium: 9.4 mg/dL (ref 8.9–10.3)
Chloride: 104 mmol/L (ref 98–111)
Creatinine, Ser: 1.8 mg/dL — ABNORMAL HIGH (ref 0.61–1.24)
GFR calc Af Amer: 37 mL/min — ABNORMAL LOW (ref >60)
GFR calc non Af Amer: 32 mL/min — ABNORMAL LOW (ref >60)
Glucose, Bld: 84 mg/dL (ref 70–99)
Potassium: 4.4 mmol/L (ref 3.5–5.1)
Sodium: 137 mmol/L (ref 135–145)
Total Bilirubin: 0.4 mg/dL (ref 0.3–1.2)
Total Protein: 7.1 g/dL (ref 6.5–8.1)

## 2020-03-13 LAB — CBC WITH DIFFERENTIAL/PLATELET
Abs Immature Granulocytes: 0.05 10*3/uL (ref 0.00–0.07)
Basophils Absolute: 0.1 10*3/uL (ref 0.0–0.1)
Basophils Relative: 1 %
Eosinophils Absolute: 0.3 10*3/uL (ref 0.0–0.5)
Eosinophils Relative: 3 %
HCT: 30.5 % — ABNORMAL LOW (ref 39.0–52.0)
Hemoglobin: 9.8 g/dL — ABNORMAL LOW (ref 13.0–17.0)
Immature Granulocytes: 0 %
Lymphocytes Relative: 12 %
Lymphs Abs: 1.4 10*3/uL (ref 0.7–4.0)
MCH: 28.3 pg (ref 26.0–34.0)
MCHC: 32.1 g/dL (ref 30.0–36.0)
MCV: 88.2 fL (ref 80.0–100.0)
Monocytes Absolute: 1 10*3/uL (ref 0.1–1.0)
Monocytes Relative: 8 %
Neutro Abs: 8.9 10*3/uL — ABNORMAL HIGH (ref 1.7–7.7)
Neutrophils Relative %: 76 %
Platelets: 176 10*3/uL (ref 150–400)
RBC: 3.46 MIL/uL — ABNORMAL LOW (ref 4.22–5.81)
RDW: 15.9 % — ABNORMAL HIGH (ref 11.5–15.5)
WBC: 11.7 10*3/uL — ABNORMAL HIGH (ref 4.0–10.5)
nRBC: 0 % (ref 0.0–0.2)

## 2020-03-13 LAB — FERRITIN: Ferritin: 330 ng/mL (ref 24–336)

## 2020-03-13 LAB — IRON AND TIBC
Iron: 30 ug/dL — ABNORMAL LOW (ref 45–182)
Saturation Ratios: 13 % — ABNORMAL LOW (ref 17.9–39.5)
TIBC: 230 ug/dL — ABNORMAL LOW (ref 250–450)
UIBC: 200 ug/dL

## 2020-03-13 NOTE — Progress Notes (Signed)
Patient stated that he had been doing well with no complaints. 

## 2020-03-16 NOTE — Progress Notes (Signed)
Hematology/Oncology Consult note [facility]  [phone] [phone]  Patient Care Team: Albina Billet, MD as PCP - General (Internal Medicine) Sindy Guadeloupe, MD as Consulting Physician (Hematology and Oncology)   Name of the patient: Devin Washington  [id number]  [birth year]   Date of visit: 03/16/20  Diagnosis- anemia likely multifactorial- iron and b12 deficiency and anemia of chronic kidney disease  Chief complaint/ Reason for visit-routine follow-up of anemia  Heme/Onc history: patient is a 86 year old male with a past medical history significant for atrial fibrillation, CHF, hypertension and prostate cancer who presented to the ER after he suffered a fall. On admission he was found to have H&H of 7.1/22.2 with an MCV of 80.1 and a platelet count of 268.BMP showed an elevated creatinine of 1.9. TSH was normal at 0.52.Patient received 1 unit of PRBC in the ER. Ferritin checked per day later was 152. Iron studies revealed low serum iron of 27, low TIBC of 229 and low iron saturation of 12%. B12 levels were low normal at 264. Folate level was normal at 17.5. Reticulocyte count was low at 2% for the degree of anemia.Coombs test was negative and haptoglobin was normal. LDH was mildly elevated at 228. Myeloma panel showed M protein of IgG kappa 0.5 g. Both kappa and lambda free light chains were elevated with a normal free light chain ratio 0.77. E Po level was 22.3  Patient underwent EGD and colonoscopy on 04/04/2018. EGD showed nonobstructing nonbleeding superficial duodenal ulcers with a clean ulcer base. There are nonbleeding erosions in the gastric antrum andno stigmata of recent bleeding.Duodenum were suspicious for H. pylori.Colonoscopy showed hemorrhoids and 7 mm cecalpolyp  Interval history-patient is doing well for his age.  He lives alone and is independent of his ADLs and IADLs.  He has not had any recent falls.   No recent hospitalizations or infections  ECOG PS- 1 Pain scale- 0   Review of systems- Review of Systems  Constitutional: Negative for chills, fever, malaise/fatigue and weight loss.  HENT: Negative for congestion, ear discharge and nosebleeds.   Eyes: Negative for blurred vision.  Respiratory: Negative for cough, hemoptysis, sputum production, shortness of breath and wheezing.   Cardiovascular: Negative for chest pain, palpitations, orthopnea and claudication.  Gastrointestinal: Negative for abdominal pain, blood in stool, constipation, diarrhea, heartburn, melena, nausea and vomiting.  Genitourinary: Negative for dysuria, flank pain, frequency, hematuria and urgency.  Musculoskeletal: Negative for back pain, joint pain and myalgias.  Skin: Negative for rash.  Neurological: Negative for dizziness, tingling, focal weakness, seizures, weakness and headaches.  Endo/Heme/Allergies: Does not bruise/bleed easily.  Psychiatric/Behavioral: Negative for depression and suicidal ideas. The patient does not have insomnia.        No Known Allergies   Past Medical History:  Diagnosis Date  . A-fib ([place])   . Cardiomyopathy ([place])   . CHF (congestive heart failure) ([place])   . DJD (degenerative joint disease)   . Duodenal ulcer 08/09/2015  . Dysrhythmia   . Erosive esophagitis   . HH (hiatus hernia)   . HLD (hyperlipidemia)   . Hypertension   . Pacemaker   . PAD (peripheral artery disease) ([place])   . Peripheral vascular disease ([place])   . Presence of permanent cardiac pacemaker   . Prostate cancer [facility])    Prostatectomy.  . 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 Shortness of breath   . SSS (sick sinus syndrome) ([place])      Past Surgical History:  Procedure Laterality Date  . CATARACT EXTRACTION, BILATERAL    . COLONOSCOPY N/A 04/04/2018   Procedure: COLONOSCOPY;  Surgeon: Lin Landsman, MD;  Location: [facility] ENDOSCOPY;  Service: Gastroenterology;  Laterality: N/A;  .  ESOPHAGOGASTRODUODENOSCOPY N/A 08/09/2015   Procedure: ESOPHAGOGASTRODUODENOSCOPY (EGD) looking in the stomach with a lighted tube to evaluate and treat;  Surgeon: Lollie Sails, MD;  Location: [facility] ENDOSCOPY;  Service: Endoscopy;  Laterality: N/A;  Procedure to be done tomorrow afternoon, 08/08/2015. Awaiting cardiology consult  . ESOPHAGOGASTRODUODENOSCOPY N/A 04/04/2018   Procedure: ESOPHAGOGASTRODUODENOSCOPY (EGD);  Surgeon: Lin Landsman, MD;  Location: [facility] ENDOSCOPY;  Service: Gastroenterology;  Laterality: N/A;  . EYE SURGERY    . FEMORAL-POPLITEAL BYPASS GRAFT Left 12/16/2017   Procedure: BYPASS GRAFT FEMORAL-POPLITEAL ARTERY ( OPEN POPLITEAL BYPASS );  Surgeon: Algernon Huxley, MD;  Location: [facility] ORS;  Service: Vascular;  Laterality: Left;  . FLEXIBLE SIGMOIDOSCOPY N/A 08/09/2015   Procedure: FLEXIBLE SIGMOIDOSCOPY looking up the rectum into the distal colon with a lighted tube to examine and treat;  Surgeon: Lollie Sails, MD;  Location: [facility] ENDOSCOPY;  Service: Endoscopy;  Laterality: N/A;  Procedure to be done tomorrow afternoon, 08/08/2015. Awaiting cardiology consult.  . INSERT / REPLACE / REMOVE PACEMAKER    . JOINT REPLACEMENT Left    left knee replacement  . LOWER EXTREMITY ANGIOGRAPHY Left 11/16/2017   Procedure: LOWER EXTREMITY ANGIOGRAPHY;  Surgeon: Algernon Huxley, MD;  Location: [place] CV LAB;  Service: Cardiovascular;  Laterality: Left;  . LOWER EXTREMITY ANGIOGRAPHY Right 11/30/2017   Procedure: LOWER EXTREMITY ANGIOGRAPHY;  Surgeon: Algernon Huxley, MD;  Location: [place] CV LAB;  Service: Cardiovascular;  Laterality: Right;  . LOWER EXTREMITY ANGIOGRAPHY Left 03/04/2018   Procedure: LOWER EXTREMITY ANGIOGRAPHY;  Surgeon: Algernon Huxley, MD;  Location: [place] CV LAB;  Service: Cardiovascular;  Laterality: Left;  . LOWER EXTREMITY ANGIOGRAPHY Left 04/12/2018   Procedure: LOWER EXTREMITY ANGIOGRAPHY;  Surgeon: Algernon Huxley, MD;  Location: [place] CV  LAB;  Service: Cardiovascular;  Laterality: Left;  . PACEMAKER INSERTION    . PROSTATE SURGERY    . PROSTATE SURGERY      Social History   Socioeconomic History  . Marital status: Widowed    Spouse name: Not on file  . Number of children: Not on file  . Years of education: Not on file  . Highest education level: Not on file  Occupational History    Employer: RETIRED/[employer]  Tobacco Use  . Smoking status: Former Smoker    Quit date: 03/02/1958    Years since quitting: 62.0  . Smokeless tobacco: Former Systems developer    Types: Chew, Snuff  Substance and Sexual Activity  . Alcohol use: No    Alcohol/week: 0.0 standard drinks  . Drug use: No  . Sexual activity: Not Currently  Other Topics Concern  . Not on file  Social History Narrative  . Not on file   Social Determinants of Health   Financial Resource Strain:   . Difficulty of Paying Living Expenses:   Food Insecurity:   . Worried About Charity fundraiser in the Last Year:   . Arboriculturist in the Last Year:   Transportation Needs:   . Film/video editor (Medical):   Marland Kitchen Lack of Transportation (Non-Medical):   Physical Activity:   . Days of Exercise per Week:   . Minutes of Exercise per Session:   Stress:   . Feeling of  Stress :   Social Connections:   . Frequency of Communication with Friends and Family:   . Frequency of Social Gatherings with Friends and Family:   . Attends Religious Services:   . Active Member of Clubs or Organizations:   . Attends Archivist Meetings:   Marland Kitchen Marital Status:   Intimate Partner Violence:   . Fear of Current or Ex-Partner:   . Emotionally Abused:   Marland Kitchen Physically Abused:   . Sexually Abused:     Family History  Problem Relation Age of Onset  . Lung cancer Father      Current Outpatient Medications:  .  acetaminophen (TYLENOL) 325 MG tablet, Take by mouth., Disp: , Rfl:  .  albuterol (PROVENTIL HFA;VENTOLIN HFA) 108 (90 Base) MCG/ACT inhaler, Inhale 2  puffs into the lungs every 6 (six) hours as needed for wheezing or shortness of breath., Disp: 1 Inhaler, Rfl: 2 .  amLODipine (NORVASC) 10 MG tablet, Take 10 mg by mouth daily. , Disp: , Rfl:  .  ANORO ELLIPTA 62.5-25 MCG/INH AEPB, Inhale 1 puff into the lungs 2 (two) times daily., Disp: , Rfl:  .  aspirin 81 MG EC tablet, Take by mouth., Disp: , Rfl:  .  benazepril (LOTENSIN) 20 MG tablet, Take 1 tablet (20 mg total) by mouth daily., Disp: 30 tablet, Rfl: 0 .  diphenhydrAMINE (BENADRYL) 25 mg capsule, Take by mouth., Disp: , Rfl:  .  feeding supplement, ENSURE ENLIVE, (ENSURE ENLIVE) LIQD, Take by mouth., Disp: , Rfl:  .  ferrous sulfate 325 (65 FE) MG tablet, Take 1 tablet (325 mg total) by mouth 2 (two) times daily with a meal. (Patient taking differently: Take 325 mg by mouth daily with breakfast. ), Disp: 60 tablet, Rfl: 3 .  furosemide (LASIX) 20 MG tablet, Take 1 tablet by mouth daily., Disp: , Rfl:  .  metoprolol tartrate (LOPRESSOR) 25 MG tablet, Take 1 tablet (25 mg total) by mouth 2 (two) times daily., Disp: 60 tablet, Rfl: 0 .  simvastatin (ZOCOR) 20 MG tablet, Take 20 mg by mouth daily. , Disp: , Rfl:  .  traMADol (ULTRAM) 50 MG tablet, Take 1 tablet (50 mg total) by mouth every 6 (six) hours as needed for moderate pain or severe pain., Disp: 30 tablet, Rfl: 0 .  traZODone (DESYREL) 50 MG tablet, Take 1 tablet by mouth at bedtime as needed for sleep. , Disp: , Rfl:  .  vitamin B-12 1000 MCG tablet, Take 1 tablet (1,000 mcg total) by mouth daily., Disp: 60 tablet, Rfl: 1 .  polyethylene glycol powder (GLYCOLAX/MIRALAX) 17 GM/SCOOP powder, Take by mouth., Disp: , Rfl:  No current facility-administered medications for this visit.  Facility-Administered Medications Ordered in Other Visits:  .  Darbepoetin Alfa (ARANESP) injection 100 mcg, 100 mcg, Subcutaneous, Q28 days, Sindy Guadeloupe, MD, 100 mcg at 04/16/18 1505  Physical exam:  Vitals:   03/13/20 1441  BP: (!) 161/90  Pulse: 74   Temp: 97.8 F (36.6 C)  TempSrc: Tympanic  SpO2: 99%  Weight: 179 lb (81.2 kg)  Height: 5\' 10"  (1.778 m)   Physical Exam Constitutional:      General: He is not in acute distress. HENT:     Head: Normocephalic and atraumatic.  Eyes:     Pupils: Pupils are equal, round, and reactive to light.  Cardiovascular:     Rate and Rhythm: Normal rate and regular rhythm.     Heart sounds: Normal heart sounds.  Pulmonary:  Effort: Pulmonary effort is normal.     Breath sounds: Normal breath sounds.  Abdominal:     General: Bowel sounds are normal.     Palpations: Abdomen is soft.  Musculoskeletal:     Cervical back: Normal range of motion.  Skin:    General: Skin is warm and dry.  Neurological:     Mental Status: He is alert and oriented to person, place, and time.      CMP Latest Ref Rng & Units 03/13/2020  Glucose 70 - 99 mg/dL 84  BUN 8 - 23 mg/dL 29(H)  Creatinine 0.61 - 1.24 mg/dL 1.80(H)  Sodium 135 - 145 mmol/L 137  Potassium 3.5 - 5.1 mmol/L 4.4  Chloride 98 - 111 mmol/L 104  CO2 22 - 32 mmol/L 26  Calcium 8.9 - 10.3 mg/dL 9.4  Total Protein 6.5 - 8.1 g/dL 7.1  Total Bilirubin 0.3 - 1.2 mg/dL 0.4  Alkaline Phos 38 - 126 U/L 68  AST 15 - 41 U/L 22  ALT 0 - 44 U/L 36   CBC Latest Ref Rng & Units 03/13/2020  WBC 4.0 - 10.5 K/uL 11.7(H)  Hemoglobin 13.0 - 17.0 g/dL 9.8(L)  Hematocrit 39.0 - 52.0 % 30.5(L)  Platelets 150 - 400 K/uL 176      Assessment and plan- Patient is a 86 y.o. male with normocytic anemia likely multifactorial secondary to iron deficiency anemia of chronic kidney disease  Patient's hemoglobin has remained stable close to 10 over the last 1 year.  He does have baseline chronic kidney disease and his creatinine has remained stable between 1.5-1.8.  His iron studies show a ferritin of 330 and iron studies are indicative of anemia of chronic disease.  Given the stability of his hemoglobin I will hold off on starting EPO at this time.  I will see him  back in 6 months with CBC ferritin and iron studies   Visit Diagnosis 1. Anemia due to chronic kidney disease, unspecified CKD stage      Dr. Randa Evens, MD, MPH [facility] [phone] 03/16/2020 8:36 AM

## 2020-05-10 IMAGING — CR DG LUMBAR SPINE 2-3V
3 series · 3 of 3 positions shown · non-contrast
Comparison: CT abdomen and pelvis 07/14/2016.

CLINICAL DATA: Low back pain since a fall this morning. Initial
encounter.

EXAM:
LUMBAR SPINE - 2-3 VIEW

[l-spine ap]
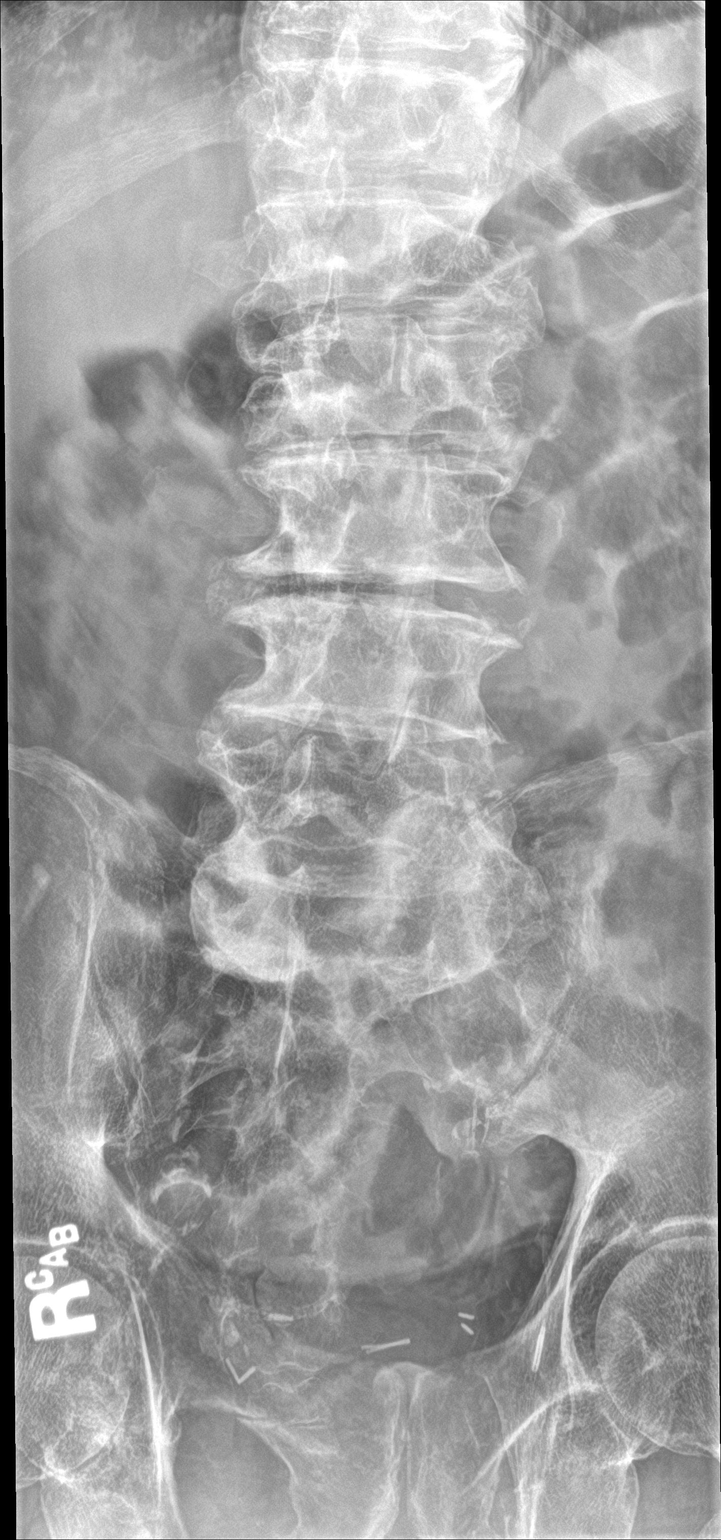

[l-spine lat (1 of 2)]
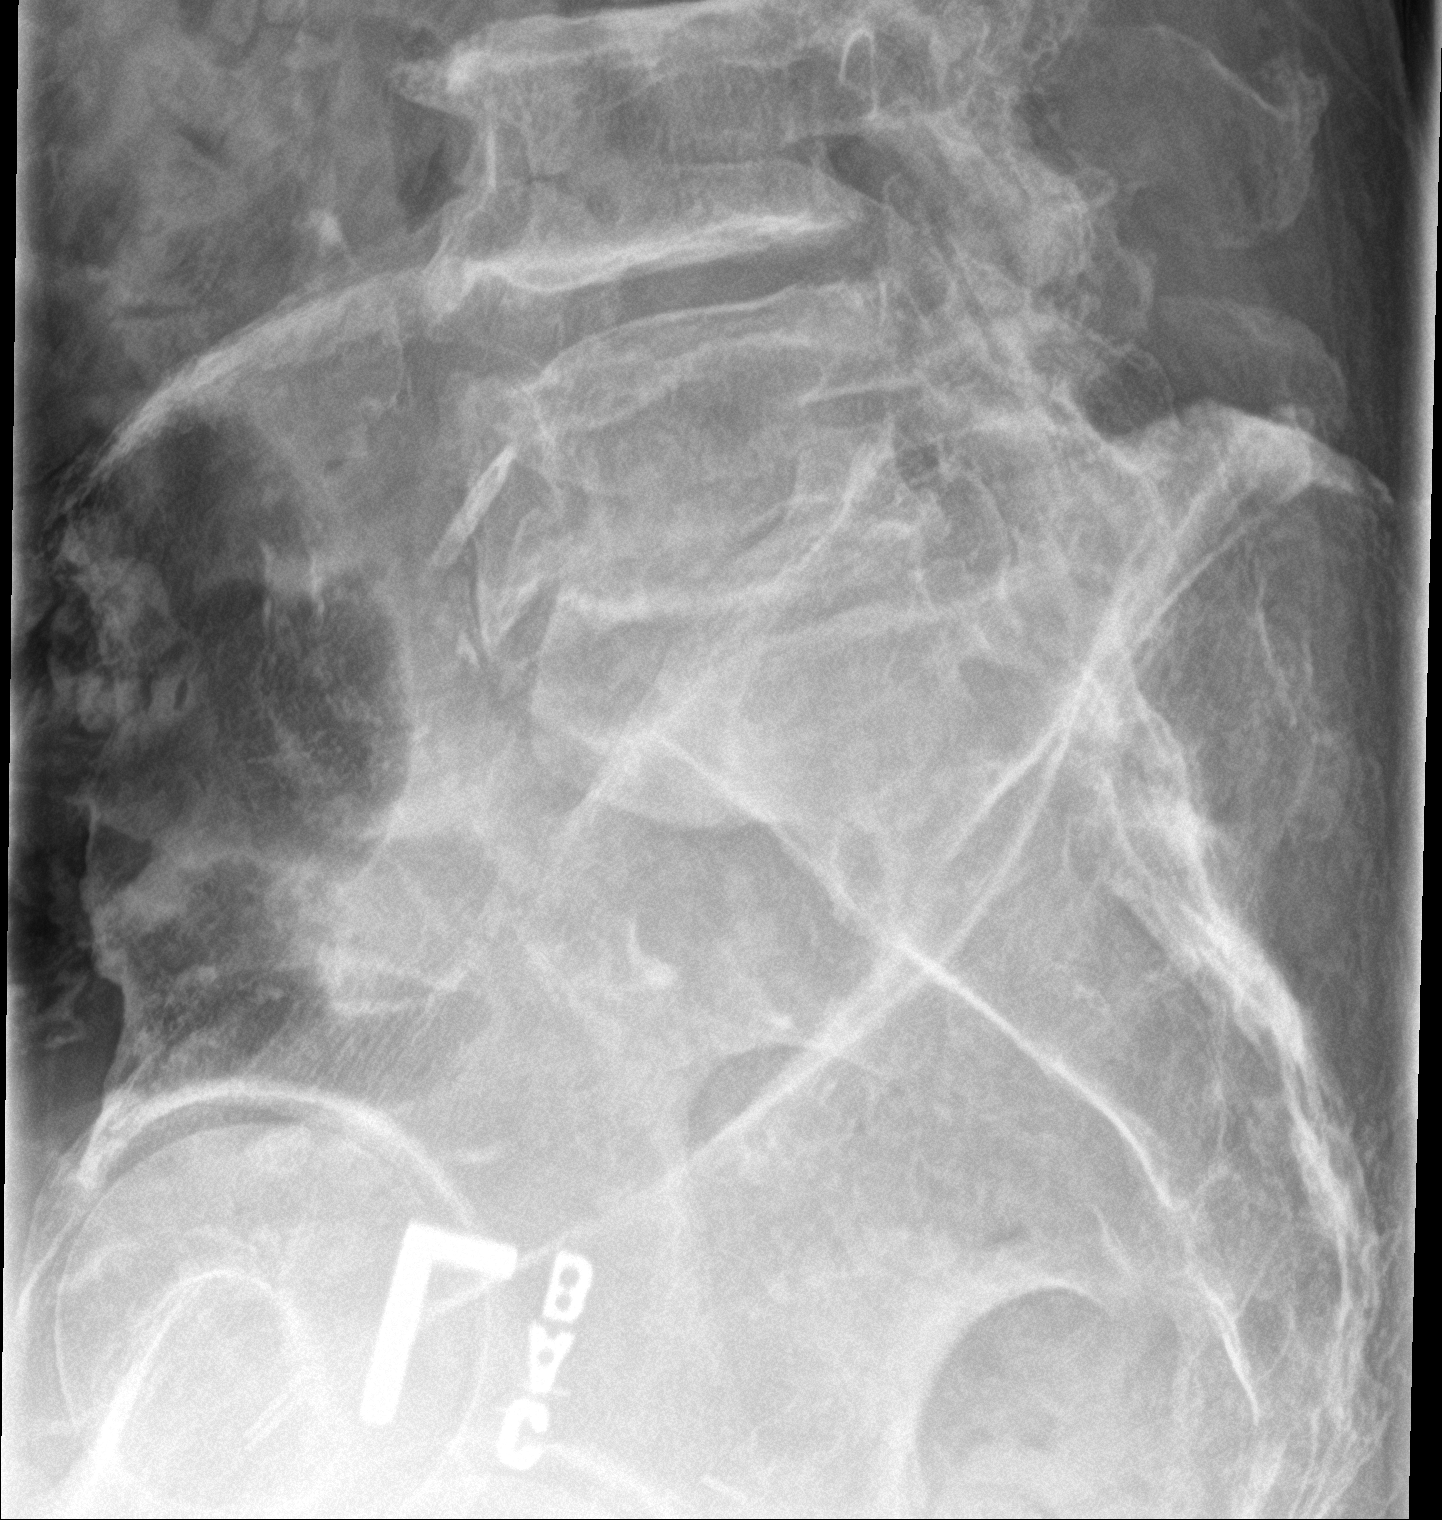

[l-spine lat (2 of 2)]
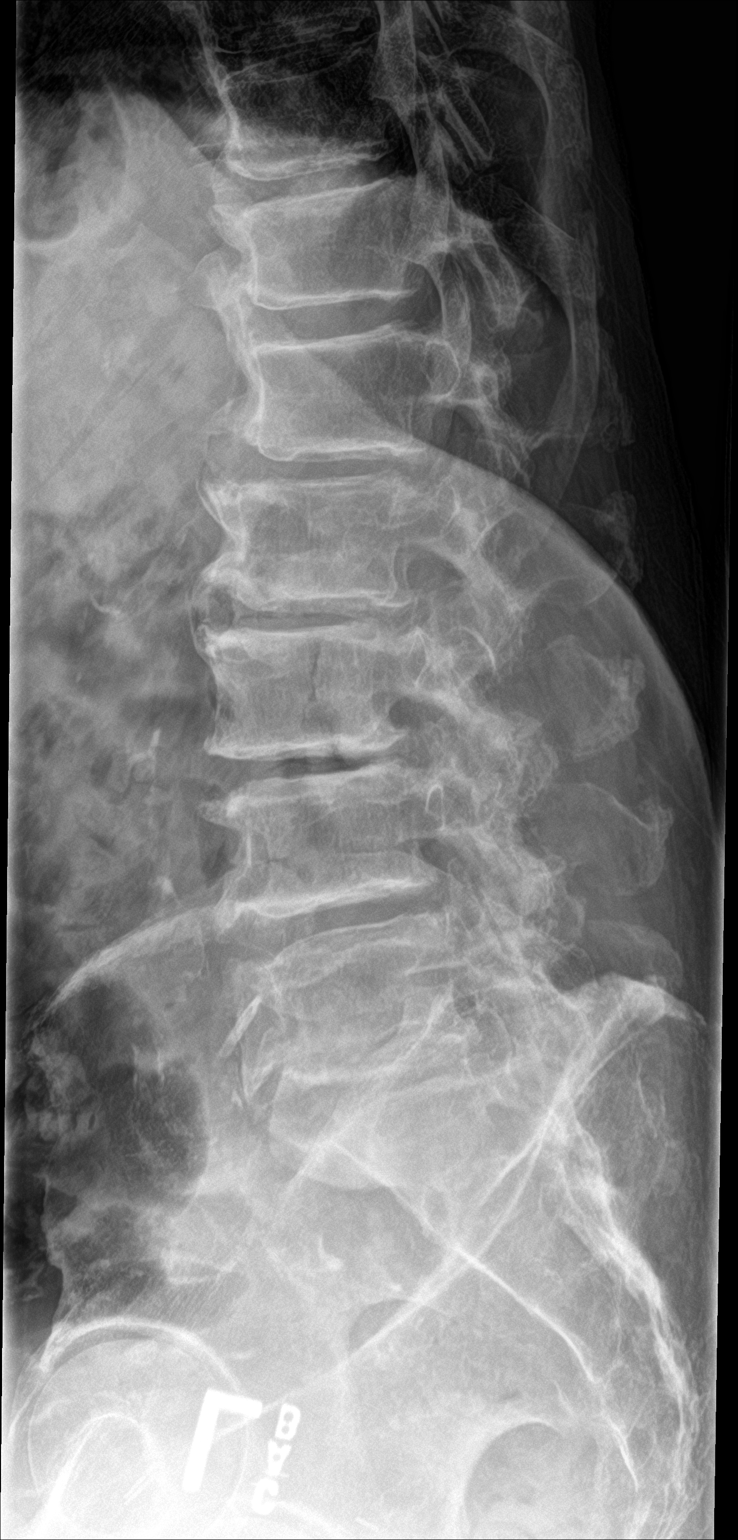

[3 of 3 positions shown; findings below may reference images not displayed]

FINDINGS: There is no acute abnormality. The patient has advanced multilevel
degenerative disc disease and facet arthropathy. No traumatic
listhesis. Aortic atherosclerosis noted.
IMPRESSION: No acute finding.  Advanced multilevel degenerative disease.

## 2020-05-10 IMAGING — CT CT PELVIS W/ CM
2 of 3 series · 16 of 46 positions shown, 18 images · IV contrast (iopamidol)
Comparison: 07/14/2016 abdominal CT

CLINICAL DATA: Pelvis trauma with fracture suspected. Fall with
right-sided pain.

EXAM:
CT PELVIS WITH CONTRAST
TECHNIQUE: Multidetector CT imaging of the pelvis was performed using the
standard protocol following the bolus administration of intravenous
contrast.
CONTRAST:  75mL HOY7KN-700 IOPAMIDOL (HOY7KN-700) INJECTION 61%

[Series 3: axial st · axial · 0.87mm/px · z∈[-1012,-788]mm · 13 of 130 slices shown, 15 images]
[im 9/130  soft-tissue]
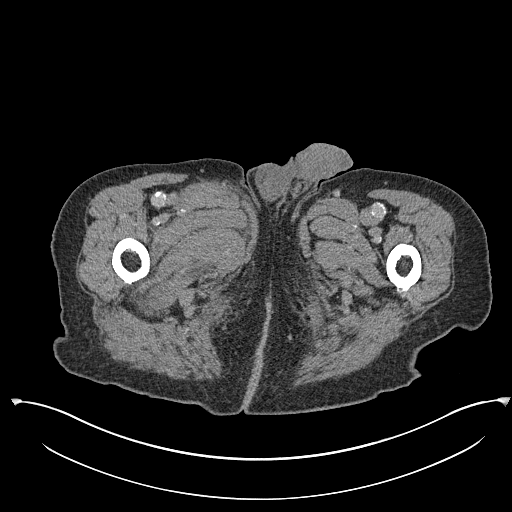
[im 9/130  bone]
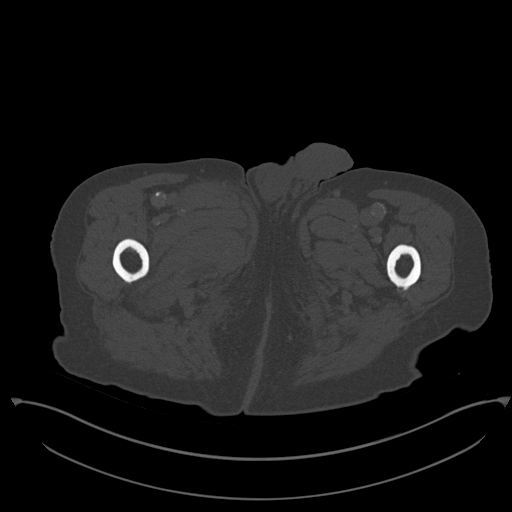
[im 17/130  soft-tissue]
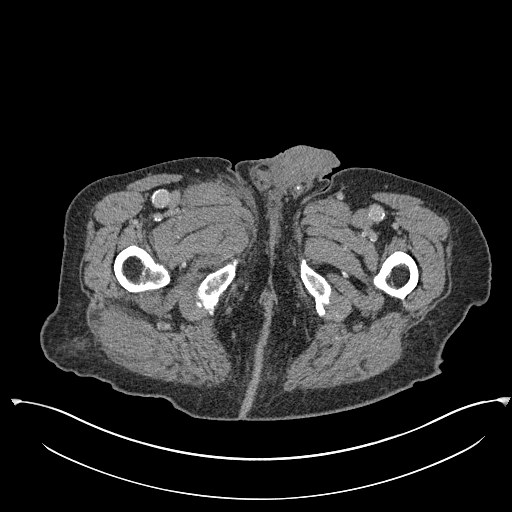
[im 25/130  soft-tissue]
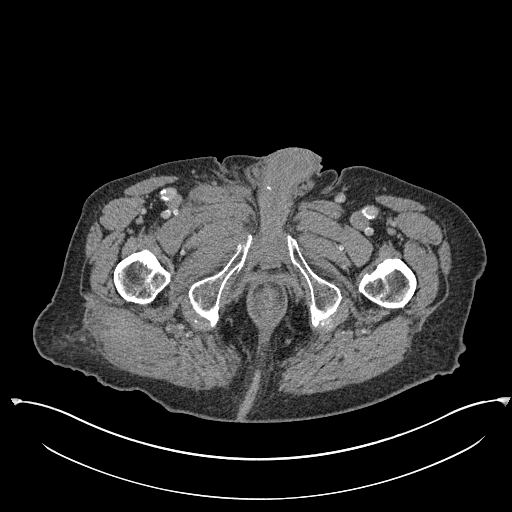
[im 38/130  soft-tissue]
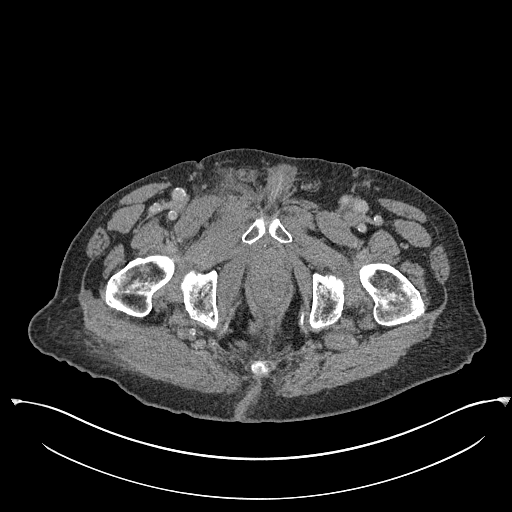
[im 46/130  soft-tissue]
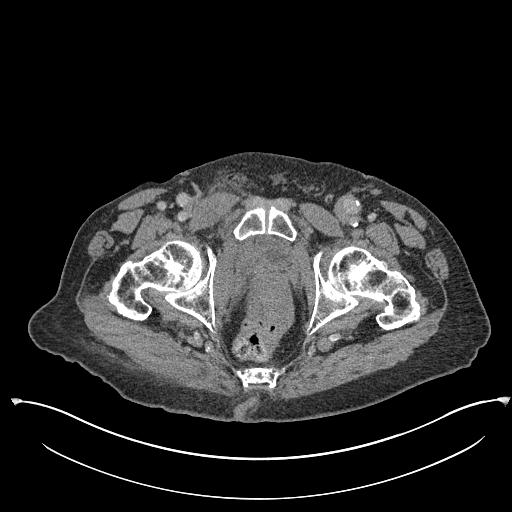
[im 55/130  soft-tissue]
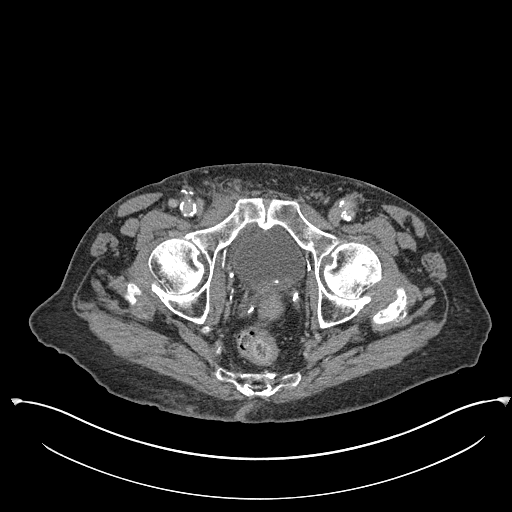
[im 67/130  soft-tissue]
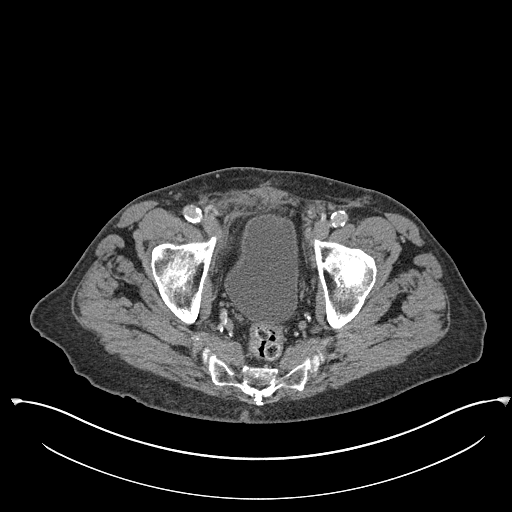
[im 75/130  soft-tissue]
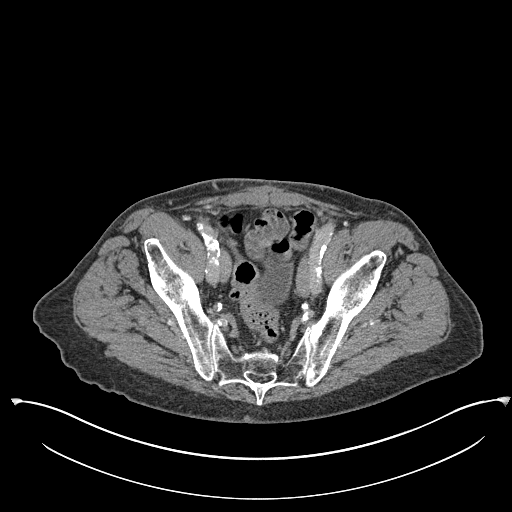
[im 84/130  soft-tissue]
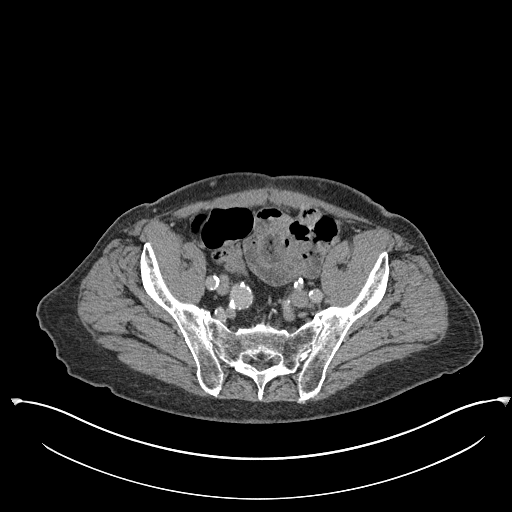
[im 84/130  bone]
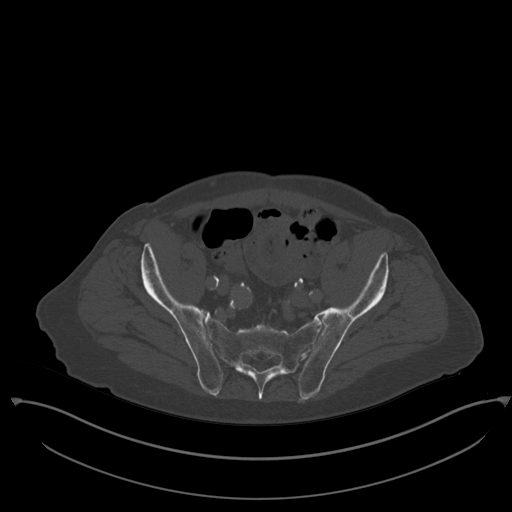
[im 92/130  soft-tissue]
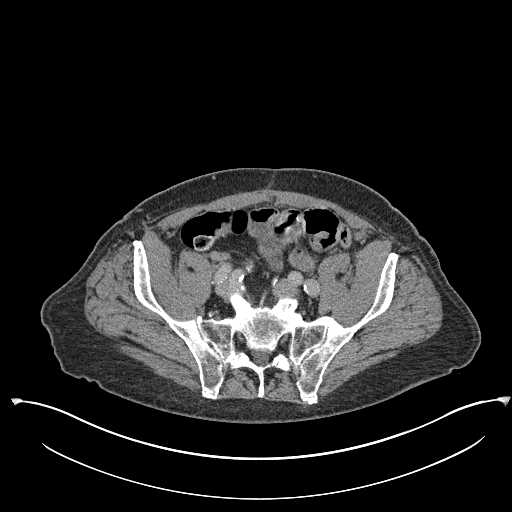
[im 105/130  soft-tissue]
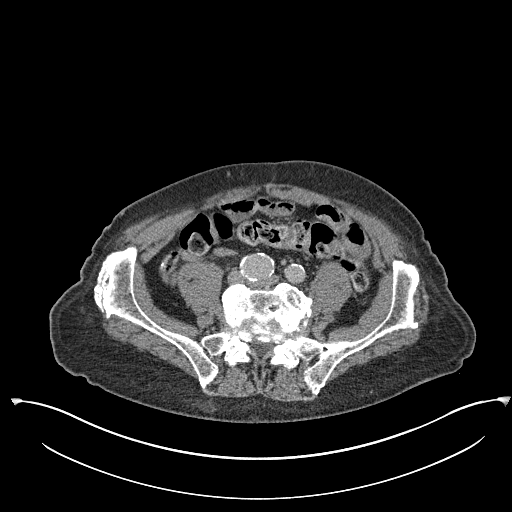
[im 113/130  soft-tissue]
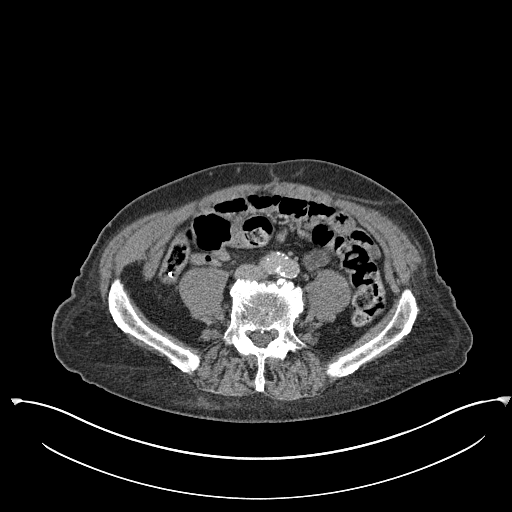
[im 121/130  soft-tissue]
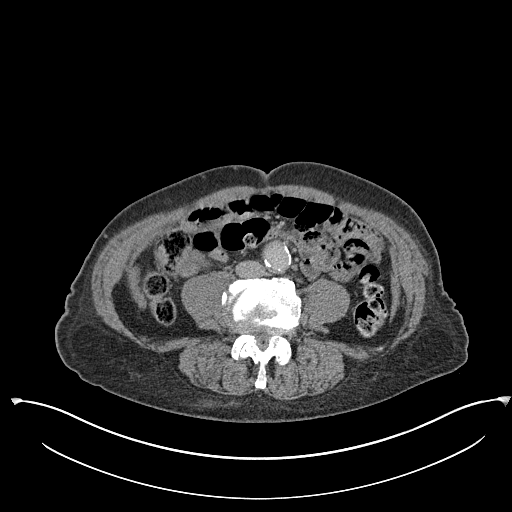

[Series 6: coronal st · coronal · 0.51mm/px · 3 of 125 slices shown]
[im 42/125  soft-tissue]
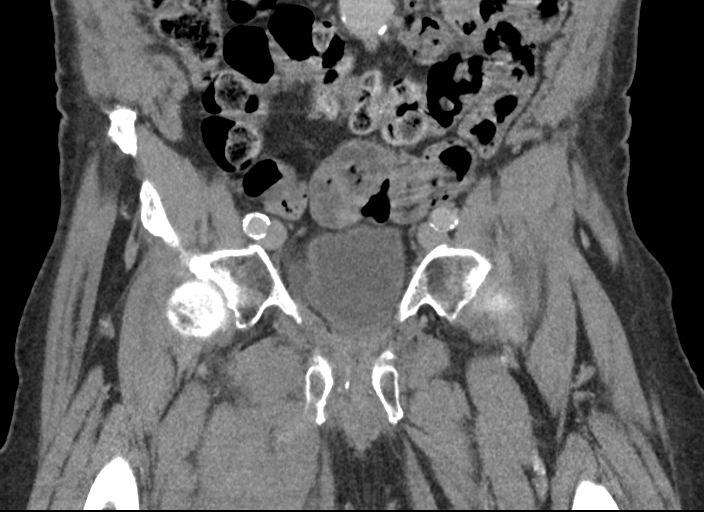
[im 56/125  soft-tissue]
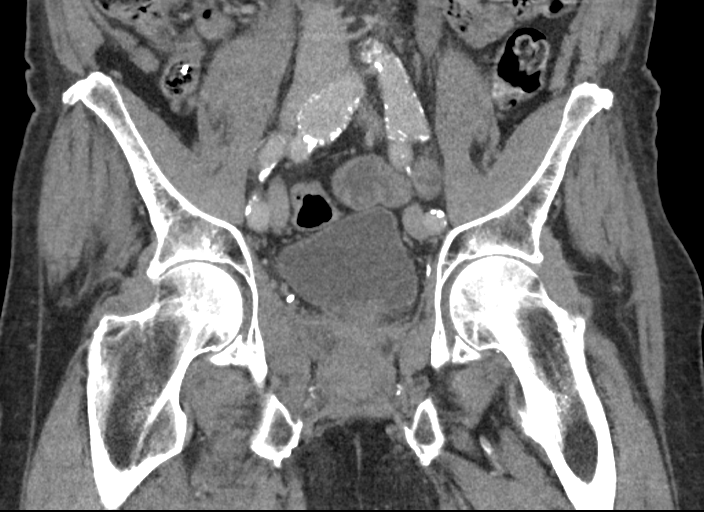
[im 69/125  soft-tissue]
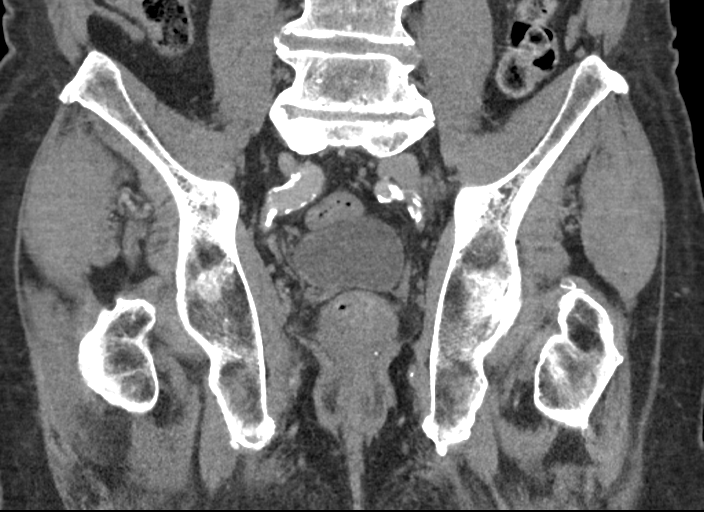

[16 of 46 positions shown; findings below may reference images not displayed]

FINDINGS: Urinary Tract:  Negative bladder

Bowel:  No acute finding.

Vascular/Lymphatic: Extensive atherosclerotic calcification. Dilated
right common iliac artery is with fusiform aneurysm measuring
cm. There is also lower abdominal aortic dilatation measuring
cm.

Reproductive:  Prostatectomy

Other:  Negative

Musculoskeletal: Nondisplaced right superior pubic and inferior
pubic ramus fracture with avulsed fragment at the abductor
compartment. No detectable sacral ala fracture. No sacroiliac or
symphysis pubis diastasis. Both hips are located.

There is soft tissue stranding about the fractures. The muscles of
the right abductor compartment are expanded and there is a wispy
high-density that is partially covered within abductor musculature,
cannot exclude injured vessel or focus of active bleeding.
IMPRESSION: 1. Nondisplaced right inferior and superior pubic ramus fractures.
2. Strain of right abductor compartment with possible focus of
active intramuscular hemorrhage (wispy high-density seen on the
lowest slice is incompletely covered). Please observe for
progressive swelling in the right thigh.
3. 3.1 cm abdominal aortic aneurysm with mild increase from 8432
(when it was 2.8 cm). 2.6 cm right common iliac artery aneurysm that
is unchanged from prior.

## 2020-06-21 ENCOUNTER — Encounter (INDEPENDENT_AMBULATORY_CARE_PROVIDER_SITE_OTHER): Payer: Medicare HMO

## 2020-06-29 ENCOUNTER — Encounter (INDEPENDENT_AMBULATORY_CARE_PROVIDER_SITE_OTHER): Payer: Self-pay

## 2020-06-29 ENCOUNTER — Ambulatory Visit (INDEPENDENT_AMBULATORY_CARE_PROVIDER_SITE_OTHER): Payer: Medicare HMO | Admitting: Nurse Practitioner

## 2020-06-29 ENCOUNTER — Other Ambulatory Visit: Payer: Self-pay

## 2020-06-29 VITALS — BP 166/82 | HR 77 | Resp 16 | Wt 167.0 lb

## 2020-06-29 DIAGNOSIS — I890 Lymphedema, not elsewhere classified: Secondary | ICD-10-CM | POA: Diagnosis not present

## 2020-06-29 NOTE — Progress Notes (Signed)
History of Present Illness  There is no documented history at this time  Assessments & Plan   There are no diagnoses linked to this encounter.    Additional instructions  Subjective:  Patient presents with venous ulcer of the Left lower extremity.    Procedure:  3 layer unna wrap was placed Left lower extremity.   Plan:   Follow up in one week.  

## 2020-07-02 ENCOUNTER — Other Ambulatory Visit (INDEPENDENT_AMBULATORY_CARE_PROVIDER_SITE_OTHER): Payer: Self-pay | Admitting: Nurse Practitioner

## 2020-07-02 DIAGNOSIS — I739 Peripheral vascular disease, unspecified: Secondary | ICD-10-CM

## 2020-07-02 DIAGNOSIS — I724 Aneurysm of artery of lower extremity: Secondary | ICD-10-CM

## 2020-07-03 ENCOUNTER — Ambulatory Visit (INDEPENDENT_AMBULATORY_CARE_PROVIDER_SITE_OTHER): Payer: Medicare HMO | Admitting: Vascular Surgery

## 2020-07-03 ENCOUNTER — Ambulatory Visit (INDEPENDENT_AMBULATORY_CARE_PROVIDER_SITE_OTHER): Payer: Medicare HMO

## 2020-07-03 ENCOUNTER — Encounter (INDEPENDENT_AMBULATORY_CARE_PROVIDER_SITE_OTHER): Payer: Self-pay | Admitting: Vascular Surgery

## 2020-07-03 ENCOUNTER — Other Ambulatory Visit: Payer: Self-pay

## 2020-07-03 VITALS — BP 163/67 | HR 72 | Resp 16

## 2020-07-03 DIAGNOSIS — I10 Essential (primary) hypertension: Secondary | ICD-10-CM

## 2020-07-03 DIAGNOSIS — I723 Aneurysm of iliac artery: Secondary | ICD-10-CM | POA: Diagnosis not present

## 2020-07-03 DIAGNOSIS — N183 Chronic kidney disease, stage 3 unspecified: Secondary | ICD-10-CM | POA: Diagnosis not present

## 2020-07-03 DIAGNOSIS — E7800 Pure hypercholesterolemia, unspecified: Secondary | ICD-10-CM

## 2020-07-03 DIAGNOSIS — M7989 Other specified soft tissue disorders: Secondary | ICD-10-CM

## 2020-07-03 DIAGNOSIS — I724 Aneurysm of artery of lower extremity: Secondary | ICD-10-CM

## 2020-07-03 DIAGNOSIS — I739 Peripheral vascular disease, unspecified: Secondary | ICD-10-CM

## 2020-07-03 NOTE — Assessment & Plan Note (Signed)
These were a little less than 3 cm at last check.  Given his advanced age and the very low risk of rupture at that size, continuing to monitor these would be prudent.

## 2020-07-03 NOTE — Assessment & Plan Note (Signed)
Duplex today shows the stent graft repair on the right to be patent without significant recurrent stenosis.  The bypass graft on the left is also patent without evidence of stenosis.  The aneurysms measure slightly more than 4 cm which is far smaller than they were at the time of repair. The seem to be doing well.  We will continue to monitor these on 54-month intervals going forward.

## 2020-07-03 NOTE — Assessment & Plan Note (Signed)
He had an Unna boot placed on his left leg last week which may have helped the swelling some.  Swelling is still noticeable but not dramatic at this point.  He would like to come out of the Unna boots and go to elevation and compression.  A new prescription for compression stockings was given today.  I will recheck this in about a month or so to see how he is doing.

## 2020-07-03 NOTE — Progress Notes (Signed)
MRN : [id number]  Devin Washington is a 86 y.o. (September 19, 1923) male who presents with chief complaint of  Chief Complaint  Patient presents with  . Follow-up    ultrasound follow up  .  History of Present Illness: Patient returns today in follow up of both his leg swelling as well as his previous popliteal aneurysm repairs.  Aneurysm repairs were done 2 to 3 years ago.  The right was repaired with a stent in the left was repaired with a bypass.  These were massive aneurysms at the time of repair particularly on the left.  Duplex today shows the stent graft repair on the right to be patent without significant recurrent stenosis.  The bypass graft on the left is also patent without evidence of stenosis.  The aneurysms measure slightly more than 4 cm which is far smaller than they were at the time of repair. He is also here to follow-up on his leg swelling.  He was put in Unna boots last week without a dramatic improvement in his left leg swelling.  His right leg swelling is fairly mild.  No ulceration or infection.  The legs just feel heavy but do not overtly hurt.  Current Outpatient Medications  Medication Sig Dispense Refill  . acetaminophen (TYLENOL) 325 MG tablet Take by mouth.    Marland Kitchen albuterol (PROVENTIL HFA;VENTOLIN HFA) 108 (90 Base) MCG/ACT inhaler Inhale 2 puffs into the lungs every 6 (six) hours as needed for wheezing or shortness of breath. 1 Inhaler 2  . amLODipine (NORVASC) 10 MG tablet Take 10 mg by mouth daily.     Jearl Klinefelter ELLIPTA 62.5-25 MCG/INH AEPB Inhale 1 puff into the lungs 2 (two) times daily.    Marland Kitchen aspirin 81 MG EC tablet Take by mouth.    . benazepril (LOTENSIN) 20 MG tablet Take 1 tablet (20 mg total) by mouth daily. 30 tablet 0  . diphenhydrAMINE (BENADRYL) 25 mg capsule Take by mouth.    . feeding supplement, ENSURE ENLIVE, (ENSURE ENLIVE) LIQD Take by mouth.    . ferrous sulfate 325 (65 FE) MG tablet Take 1 tablet (325 mg total) by mouth 2 (two) times daily with a meal.  (Patient taking differently: Take 325 mg by mouth daily with breakfast. ) 60 tablet 3  . furosemide (LASIX) 20 MG tablet Take 1 tablet by mouth daily.    . metoprolol tartrate (LOPRESSOR) 25 MG tablet Take 1 tablet (25 mg total) by mouth 2 (two) times daily. 60 tablet 0  . polyethylene glycol powder (GLYCOLAX/MIRALAX) 17 GM/SCOOP powder Take by mouth.    . simvastatin (ZOCOR) 20 MG tablet Take 20 mg by mouth daily.     . traZODone (DESYREL) 50 MG tablet Take 1 tablet by mouth at bedtime as needed for sleep.     . vitamin B-12 1000 MCG tablet Take 1 tablet (1,000 mcg total) by mouth daily. 60 tablet 1  . traMADol (ULTRAM) 50 MG tablet Take 1 tablet (50 mg total) by mouth every 6 (six) hours as needed for moderate pain or severe pain. (Patient not taking: Reported on 06/29/2020) 30 tablet 0   No current facility-administered medications for this visit.   Facility-Administered Medications Ordered in Other Visits  Medication Dose Route Frequency Provider Last Rate Last Admin  . Darbepoetin Alfa (ARANESP) injection 100 mcg  100 mcg Subcutaneous Q28 days Sindy Guadeloupe, MD   100 mcg at 04/16/18 1505    Past Medical History:  Diagnosis Date  .  A-fib ([place])   . Cardiomyopathy ([place])   . CHF (congestive heart failure) ([place])   . DJD (degenerative joint disease)   . Duodenal ulcer 08/09/2015  . Dysrhythmia   . Erosive esophagitis   . HH (hiatus hernia)   . HLD (hyperlipidemia)   . Hypertension   . Pacemaker   . PAD (peripheral artery disease) ([place])   . Peripheral vascular disease ([place])   . Presence of permanent cardiac pacemaker   . Prostate cancer [facility])    Prostatectomy.  . 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 Shortness of breath   . SSS (sick sinus syndrome) [facility])     Past Surgical History:  Procedure Laterality Date  . CATARACT EXTRACTION, BILATERAL    . COLONOSCOPY N/A 04/04/2018   Procedure: COLONOSCOPY;  Surgeon: Lin Landsman, MD;  Location: [facility] ENDOSCOPY;  Service:  Gastroenterology;  Laterality: N/A;  . ESOPHAGOGASTRODUODENOSCOPY N/A 08/09/2015   Procedure: ESOPHAGOGASTRODUODENOSCOPY (EGD) looking in the stomach with a lighted tube to evaluate and treat;  Surgeon: Lollie Sails, MD;  Location: [facility] ENDOSCOPY;  Service: Endoscopy;  Laterality: N/A;  Procedure to be done tomorrow afternoon, 08/08/2015. Awaiting cardiology consult  . ESOPHAGOGASTRODUODENOSCOPY N/A 04/04/2018   Procedure: ESOPHAGOGASTRODUODENOSCOPY (EGD);  Surgeon: Lin Landsman, MD;  Location: [facility] ENDOSCOPY;  Service: Gastroenterology;  Laterality: N/A;  . EYE SURGERY    . FEMORAL-POPLITEAL BYPASS GRAFT Left 12/16/2017   Procedure: BYPASS GRAFT FEMORAL-POPLITEAL ARTERY ( OPEN POPLITEAL BYPASS );  Surgeon: Algernon Huxley, MD;  Location: [facility] ORS;  Service: Vascular;  Laterality: Left;  . FLEXIBLE SIGMOIDOSCOPY N/A 08/09/2015   Procedure: FLEXIBLE SIGMOIDOSCOPY looking up the rectum into the distal colon with a lighted tube to examine and treat;  Surgeon: Lollie Sails, MD;  Location: [facility] ENDOSCOPY;  Service: Endoscopy;  Laterality: N/A;  Procedure to be done tomorrow afternoon, 08/08/2015. Awaiting cardiology consult.  . INSERT / REPLACE / REMOVE PACEMAKER    . JOINT REPLACEMENT Left    left knee replacement  . LOWER EXTREMITY ANGIOGRAPHY Left 11/16/2017   Procedure: LOWER EXTREMITY ANGIOGRAPHY;  Surgeon: Algernon Huxley, MD;  Location: [place] CV LAB;  Service: Cardiovascular;  Laterality: Left;  . LOWER EXTREMITY ANGIOGRAPHY Right 11/30/2017   Procedure: LOWER EXTREMITY ANGIOGRAPHY;  Surgeon: Algernon Huxley, MD;  Location: [place] CV LAB;  Service: Cardiovascular;  Laterality: Right;  . LOWER EXTREMITY ANGIOGRAPHY Left 03/04/2018   Procedure: LOWER EXTREMITY ANGIOGRAPHY;  Surgeon: Algernon Huxley, MD;  Location: [place] CV LAB;  Service: Cardiovascular;  Laterality: Left;  . LOWER EXTREMITY ANGIOGRAPHY Left 04/12/2018   Procedure: LOWER EXTREMITY ANGIOGRAPHY;  Surgeon: Algernon Huxley, MD;  Location: [place] CV LAB;  Service: Cardiovascular;  Laterality: Left;  . PACEMAKER INSERTION    . PROSTATE SURGERY    . PROSTATE SURGERY       Social History   Tobacco Use  . Smoking status: Former Smoker    Quit date: 03/02/1958    Years since quitting: 62.3  . Smokeless tobacco: Former Systems developer    Types: Chew, Snuff  Vaping Use  . Vaping Use: Never used  Substance Use Topics  . Alcohol use: No    Alcohol/week: 0.0 standard drinks  . Drug use: No      Family History  Problem Relation Age of Onset  . Lung cancer Father   no bleeding or clotting disorders No autoimmune diseases   No Known Allergies  REVIEW OF SYSTEMS(Negative unless checked)  Constitutional: [] ??Weight loss [] ??Fever [] ??Chills  Cardiac: [] ??Chest pain [] ??Chest pressure [] ??Palpitations [] ??Shortness of breath when laying flat [] ??Shortness of breath at rest [] ??Shortness of breath with exertion. Vascular: [x] ??Pain in legs with walking [] ??Pain in legs at rest [] ??Pain in legs when laying flat [] ??Claudication [] ??Pain in feet when walking [] ??Pain in feet at rest [] ??Pain in feet when laying flat [] ??History of DVT [] ??Phlebitis [x] ??Swelling in legs [] ??Varicose veins [] ??Non-healing ulcers Pulmonary: [] ??Uses home oxygen [] ??Productive cough [] ??Hemoptysis [] ??Wheeze [] ??COPD [] ??Asthma Neurologic: [] ??Dizziness [] ??Blackouts [] ??Seizures [] ??History of stroke [] ??History of TIA [] ??Aphasia [] ??Temporary blindness [] ??Dysphagia [] ??Weakness or numbness in arms [] ??Weakness or numbness in legs Musculoskeletal: [x] ??Arthritis [] ??Joint swelling [] ??Joint pain [] ??Low back pain Hematologic: [] ??Easy bruising [] ??Easy bleeding [] ??Hypercoagulable state [x] ??Anemic  Gastrointestinal: [] ??Blood in stool [] ??Vomiting blood [] ??Gastroesophageal reflux/heartburn [] ??Abdominal pain Genitourinary: [] ??Chronic kidney  disease [] ??Difficult urination [] ??Frequent urination [] ??Burning with urination [] ??Hematuria Skin: [] ??Rashes [] ??Ulcers [] ??Wounds Psychological: [] ??History of anxiety [] ??History of major depression.  Physical Examination  BP (!) 163/67 (BP Location: Right Arm)   Pulse 72   Resp 16  Gen:  WD/WN, NAD.  Appears younger than stated age Head: [place]/AT, No temporalis wasting. Ear/Nose/Throat: Hearing grossly intact, nares w/o erythema or drainage Eyes: Conjunctiva clear. Sclera non-icteric Neck: Supple.  Trachea midline Pulmonary:  Good air movement, no use of accessory muscles.  Cardiac: Irregular Vascular:  Vessel Right Left  Radial Palpable Palpable                          PT  1+ palpable  trace palpable  DP  trace palpable  trace palpable   Musculoskeletal: M/S 5/5 throughout.  No deformity or atrophy.  Trace right lower extremity edema, 2+ left lower extremity edema. Neurologic: Sensation grossly intact in extremities.  Symmetrical.  Speech is fluent.  Psychiatric: Judgment intact, Mood & affect appropriate for pt's clinical situation. Dermatologic: No rashes or ulcers noted.  No cellulitis or open wounds.       Labs No results found for this or any previous visit (from the past 2160 hour(s)).  Radiology No results found.  Assessment/Plan HTN (hypertension) blood pressure control important in reducing the progression of atherosclerotic disease. On appropriate oral medications.  CKD (chronic kidney disease), stage III Hydration important with each contrast procedure.  HLD (hyperlipidemia) lipid control important in reducing the progression of atherosclerotic disease. Continue statin therapy  Peripheral artery disease (HCC) Duplex today shows the stent graft repair on the right to be patent without significant recurrent stenosis.  The bypass graft on the left is also patent without evidence of stenosis.  The aneurysms measure slightly more  than 4 cm which is far smaller than they were at the time of repair. The seem to be doing well.  We will continue to monitor these on 52-month intervals going forward.  Iliac artery aneurysm (HCC) These were a little less than 3 cm at last check.  Given his advanced age and the very low risk of rupture at that size, continuing to monitor these would be prudent.  Swelling of limb He had an Unna boot placed on his left leg last week which may have helped the swelling some.  Swelling is still noticeable but not dramatic at this point.  He would like to come out of the Unna boots and go to elevation and compression.  A new prescription for compression stockings was given today.  I will recheck this in about a month or so to see how he is doing.    Leotis Pain, MD  07/03/2020 11:02 AM    This note was created with Dragon medical transcription system.  Any errors from dictation are purely unintentional

## 2020-08-03 ENCOUNTER — Other Ambulatory Visit: Payer: Self-pay

## 2020-08-03 ENCOUNTER — Encounter (INDEPENDENT_AMBULATORY_CARE_PROVIDER_SITE_OTHER): Payer: Self-pay | Admitting: Vascular Surgery

## 2020-08-03 ENCOUNTER — Ambulatory Visit (INDEPENDENT_AMBULATORY_CARE_PROVIDER_SITE_OTHER): Payer: Medicare HMO | Admitting: Vascular Surgery

## 2020-08-03 VITALS — BP 183/71 | HR 66 | Resp 16 | Wt 173.0 lb

## 2020-08-03 DIAGNOSIS — I10 Essential (primary) hypertension: Secondary | ICD-10-CM | POA: Diagnosis not present

## 2020-08-03 DIAGNOSIS — I724 Aneurysm of artery of lower extremity: Secondary | ICD-10-CM

## 2020-08-03 DIAGNOSIS — I739 Peripheral vascular disease, unspecified: Secondary | ICD-10-CM

## 2020-08-03 DIAGNOSIS — I723 Aneurysm of iliac artery: Secondary | ICD-10-CM

## 2020-08-03 DIAGNOSIS — M7989 Other specified soft tissue disorders: Secondary | ICD-10-CM

## 2020-08-03 DIAGNOSIS — N183 Chronic kidney disease, stage 3 unspecified: Secondary | ICD-10-CM

## 2020-08-03 NOTE — Progress Notes (Signed)
MRN : [id number]  Devin Washington is a 86 y.o. (1923-09-25) male who presents with chief complaint of  Chief Complaint  Patient presents with  . Follow-up    53month follow up  .  History of Present Illness: Patient returns today in follow up of his leg swelling to recheck this. He is doing well.  This is about the same. No new ulcerations or infection.  His granddaughter says his renal function has dropped from 36 to 49.    Current Outpatient Medications  Medication Sig Dispense Refill  . acetaminophen (TYLENOL) 325 MG tablet Take by mouth.    Marland Kitchen albuterol (PROVENTIL HFA;VENTOLIN HFA) 108 (90 Base) MCG/ACT inhaler Inhale 2 puffs into the lungs every 6 (six) hours as needed for wheezing or shortness of breath. 1 Inhaler 2  . amLODipine (NORVASC) 10 MG tablet Take 10 mg by mouth daily.     Jearl Klinefelter ELLIPTA 62.5-25 MCG/INH AEPB Inhale 1 puff into the lungs 2 (two) times daily.    Marland Kitchen aspirin 81 MG EC tablet Take by mouth.    . benazepril (LOTENSIN) 20 MG tablet Take 1 tablet (20 mg total) by mouth daily. 30 tablet 0  . diphenhydrAMINE (BENADRYL) 25 mg capsule Take by mouth.    . feeding supplement, ENSURE ENLIVE, (ENSURE ENLIVE) LIQD Take by mouth.    . ferrous sulfate 325 (65 FE) MG tablet Take 1 tablet (325 mg total) by mouth 2 (two) times daily with a meal. (Patient taking differently: Take 325 mg by mouth daily with breakfast. ) 60 tablet 3  . furosemide (LASIX) 20 MG tablet Take 1 tablet by mouth daily.    . metoprolol tartrate (LOPRESSOR) 25 MG tablet Take 1 tablet (25 mg total) by mouth 2 (two) times daily. 60 tablet 0  . polyethylene glycol powder (GLYCOLAX/MIRALAX) 17 GM/SCOOP powder Take by mouth.    . simvastatin (ZOCOR) 20 MG tablet Take 20 mg by mouth daily.     . traZODone (DESYREL) 50 MG tablet Take 1 tablet by mouth at bedtime as needed for sleep.     . vitamin B-12 1000 MCG tablet Take 1 tablet (1,000 mcg total) by mouth daily. 60 tablet 1  . simvastatin (ZOCOR) 40 MG tablet  Take 40 mg by mouth daily.    . traMADol (ULTRAM) 50 MG tablet Take 1 tablet (50 mg total) by mouth every 6 (six) hours as needed for moderate pain or severe pain. (Patient not taking: Reported on 06/29/2020) 30 tablet 0   No current facility-administered medications for this visit.   Facility-Administered Medications Ordered in Other Visits  Medication Dose Route Frequency Provider Last Rate Last Admin  . Darbepoetin Alfa (ARANESP) injection 100 mcg  100 mcg Subcutaneous Q28 days Sindy Guadeloupe, MD   100 mcg at 04/16/18 1505    Past Medical History:  Diagnosis Date  . A-fib ([place])   . Cardiomyopathy ([place])   . CHF (congestive heart failure) ([place])   . DJD (degenerative joint disease)   . Duodenal ulcer 08/09/2015  . Dysrhythmia   . Erosive esophagitis   . HH (hiatus hernia)   . HLD (hyperlipidemia)   . Hypertension   . Pacemaker   . PAD (peripheral artery disease) ([place])   . Peripheral vascular disease ([place])   . Presence of permanent cardiac pacemaker   . Prostate cancer [facility])    Prostatectomy.  . 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 Shortness of breath   . SSS (sick sinus syndrome) ([place])  Past Surgical History:  Procedure Laterality Date  . CATARACT EXTRACTION, BILATERAL    . COLONOSCOPY N/A 04/04/2018   Procedure: COLONOSCOPY;  Surgeon: Lin Landsman, MD;  Location: [facility] ENDOSCOPY;  Service: Gastroenterology;  Laterality: N/A;  . ESOPHAGOGASTRODUODENOSCOPY N/A 08/09/2015   Procedure: ESOPHAGOGASTRODUODENOSCOPY (EGD) looking in the stomach with a lighted tube to evaluate and treat;  Surgeon: Lollie Sails, MD;  Location: [facility] ENDOSCOPY;  Service: Endoscopy;  Laterality: N/A;  Procedure to be done tomorrow afternoon, 08/08/2015. Awaiting cardiology consult  . ESOPHAGOGASTRODUODENOSCOPY N/A 04/04/2018   Procedure: ESOPHAGOGASTRODUODENOSCOPY (EGD);  Surgeon: Lin Landsman, MD;  Location: [facility] ENDOSCOPY;  Service: Gastroenterology;  Laterality: N/A;  . EYE SURGERY    .  FEMORAL-POPLITEAL BYPASS GRAFT Left 12/16/2017   Procedure: BYPASS GRAFT FEMORAL-POPLITEAL ARTERY ( OPEN POPLITEAL BYPASS );  Surgeon: Algernon Huxley, MD;  Location: [facility] ORS;  Service: Vascular;  Laterality: Left;  . FLEXIBLE SIGMOIDOSCOPY N/A 08/09/2015   Procedure: FLEXIBLE SIGMOIDOSCOPY looking up the rectum into the distal colon with a lighted tube to examine and treat;  Surgeon: Lollie Sails, MD;  Location: [facility] ENDOSCOPY;  Service: Endoscopy;  Laterality: N/A;  Procedure to be done tomorrow afternoon, 08/08/2015. Awaiting cardiology consult.  . INSERT / REPLACE / REMOVE PACEMAKER    . JOINT REPLACEMENT Left    left knee replacement  . LOWER EXTREMITY ANGIOGRAPHY Left 11/16/2017   Procedure: LOWER EXTREMITY ANGIOGRAPHY;  Surgeon: Algernon Huxley, MD;  Location: [place] CV LAB;  Service: Cardiovascular;  Laterality: Left;  . LOWER EXTREMITY ANGIOGRAPHY Right 11/30/2017   Procedure: LOWER EXTREMITY ANGIOGRAPHY;  Surgeon: Algernon Huxley, MD;  Location: [place] CV LAB;  Service: Cardiovascular;  Laterality: Right;  . LOWER EXTREMITY ANGIOGRAPHY Left 03/04/2018   Procedure: LOWER EXTREMITY ANGIOGRAPHY;  Surgeon: Algernon Huxley, MD;  Location: [place] CV LAB;  Service: Cardiovascular;  Laterality: Left;  . LOWER EXTREMITY ANGIOGRAPHY Left 04/12/2018   Procedure: LOWER EXTREMITY ANGIOGRAPHY;  Surgeon: Algernon Huxley, MD;  Location: [place] CV LAB;  Service: Cardiovascular;  Laterality: Left;  . PACEMAKER INSERTION    . PROSTATE SURGERY    . PROSTATE SURGERY       Social History   Tobacco Use  . Smoking status: Former Smoker    Quit date: 03/02/1958    Years since quitting: 62.4  . Smokeless tobacco: Former Systems developer    Types: Chew, Snuff  Vaping Use  . Vaping Use: Never used  Substance Use Topics  . Alcohol use: No    Alcohol/week: 0.0 standard drinks  . Drug use: No      Family History  Problem Relation Age of Onset  . Lung cancer Father      No Known  Allergies   REVIEW OF SYSTEMS(Negative unless checked)  Constitutional: [] ???Weight loss [] ???Fever [] ???Chills Cardiac: [] ???Chest pain [] ???Chest pressure [] ???Palpitations [] ???Shortness of breath when laying flat [] ???Shortness of breath at rest [] ???Shortness of breath with exertion. Vascular: [x] ???Pain in legs with walking [] ???Pain in legs at rest [] ???Pain in legs when laying flat [] ???Claudication [] ???Pain in feet when walking [] ???Pain in feet at rest [] ???Pain in feet when laying flat [] ???History of DVT [] ???Phlebitis [x] ???Swelling in legs [] ???Varicose veins [] ???Non-healing ulcers Pulmonary: [] ???Uses home oxygen [] ???Productive cough [] ???Hemoptysis [] ???Wheeze [] ???COPD [] ???Asthma Neurologic: [] ???Dizziness [] ???Blackouts [] ???Seizures [] ???History of stroke [] ???History of TIA [] ???Aphasia [] ???Temporary blindness [] ???Dysphagia [] ???Weakness or numbness in arms [] ???Weakness or numbness in legs Musculoskeletal: [x] ???Arthritis [] ???Joint swelling [] ???Joint pain [] ???Low back pain Hematologic: [] ???Easy bruising [] ???Easy bleeding [] ???Hypercoagulable  state [x] ???Anemic  Gastrointestinal: [] ???Blood in stool [] ???Vomiting blood [] ???Gastroesophageal reflux/heartburn [] ???Abdominal pain Genitourinary: [] ???Chronic kidney disease [] ???Difficult urination [] ???Frequent urination [] ???Burning with urination [] ???Hematuria Skin: [] ???Rashes [] ???Ulcers [] ???Wounds Psychological: [] ???History of anxiety [] ???History of major depression.  Physical Examination  BP (!) 183/71 (BP Location: Right Arm)   Pulse 66   Resp 16   Wt 173 lb (78.5 kg)   BMI 24.82 kg/m  Gen:  WD/WN, NAD. Appears much younger than stated age. Head: [place]/AT, No temporalis wasting. Ear/Nose/Throat: Hearing grossly intact, nares w/o erythema or drainage Eyes: Conjunctiva clear. Sclera non-icteric Neck: Supple.   Trachea midline Pulmonary:  Good air movement, no use of accessory muscles.  Cardiac: irregular Vascular:  Vessel Right Left  Radial Palpable Palpable               Musculoskeletal: M/S 5/5 throughout.  No deformity or atrophy. 1+ RLE edema, 2+ LLE edema. Neurologic: Sensation grossly intact in extremities.  Symmetrical.  Speech is fluent.  Psychiatric: Judgment intact, Mood & affect appropriate for pt's clinical situation. Dermatologic: No rashes or ulcers noted.  No cellulitis or open wounds.       Labs No results found for this or any previous visit (from the past 2160 hour(s)).  Radiology No results found.  Assessment/Plan HTN (hypertension) blood pressure control important in reducing the progression of atherosclerotic disease. On appropriate oral medications.  CKD (chronic kidney disease), stage III Hydration important with each contrast procedure. A little worse recently.  HLD (hyperlipidemia) lipid control important in reducing the progression of atherosclerotic disease. Continue statin therapy  Peripheral artery disease (HCC) Duplex recently shows the stent graft repair on the right to be patent without significant recurrent stenosis.  The bypass graft on the left is also patent without evidence of stenosis.  The aneurysms measure slightly more than 4 cm which is far smaller than they were at the time of repair. The seem to be doing well.  We will continue to monitor these on 11-month intervals going forward.  Iliac artery aneurysm (HCC) These were a little less than 3 cm at last check.  Given his advanced age and the very low risk of rupture at that size, continuing to monitor these would be prudent.  Swelling of limb Stable. About the same. Elevate and use compression wraps.     Leotis Pain, MD  08/03/2020 11:18 AM    This note was created with Dragon medical transcription system.  Any errors from dictation are purely unintentional

## 2020-08-03 NOTE — Assessment & Plan Note (Signed)
Stable. About the same. Elevate and use compression wraps.

## 2020-09-13 ENCOUNTER — Other Ambulatory Visit: Payer: Self-pay

## 2020-09-13 ENCOUNTER — Inpatient Hospital Stay: Payer: Medicare HMO | Attending: Oncology

## 2020-09-13 ENCOUNTER — Inpatient Hospital Stay (HOSPITAL_BASED_OUTPATIENT_CLINIC_OR_DEPARTMENT_OTHER): Payer: Medicare HMO | Admitting: Oncology

## 2020-09-13 ENCOUNTER — Other Ambulatory Visit: Payer: Self-pay | Admitting: *Deleted

## 2020-09-13 ENCOUNTER — Encounter: Payer: Self-pay | Admitting: Oncology

## 2020-09-13 VITALS — BP 158/84 | HR 63 | Temp 97.6°F | Resp 20 | Wt 171.0 lb

## 2020-09-13 DIAGNOSIS — N189 Chronic kidney disease, unspecified: Secondary | ICD-10-CM | POA: Diagnosis not present

## 2020-09-13 DIAGNOSIS — Z950 Presence of cardiac pacemaker: Secondary | ICD-10-CM | POA: Insufficient documentation

## 2020-09-13 DIAGNOSIS — D631 Anemia in chronic kidney disease: Secondary | ICD-10-CM

## 2020-09-13 DIAGNOSIS — Z8546 Personal history of malignant neoplasm of prostate: Secondary | ICD-10-CM | POA: Insufficient documentation

## 2020-09-13 DIAGNOSIS — D509 Iron deficiency anemia, unspecified: Secondary | ICD-10-CM

## 2020-09-13 DIAGNOSIS — I130 Hypertensive heart and chronic kidney disease with heart failure and stage 1 through stage 4 chronic kidney disease, or unspecified chronic kidney disease: Secondary | ICD-10-CM | POA: Insufficient documentation

## 2020-09-13 DIAGNOSIS — I4891 Unspecified atrial fibrillation: Secondary | ICD-10-CM | POA: Diagnosis not present

## 2020-09-13 LAB — VITAMIN B12: Vitamin B-12: 1420 pg/mL — ABNORMAL HIGH (ref 180–914)

## 2020-09-13 LAB — IRON AND TIBC
Iron: 36 ug/dL — ABNORMAL LOW (ref 45–182)
Saturation Ratios: 15 % — ABNORMAL LOW (ref 17.9–39.5)
TIBC: 237 ug/dL — ABNORMAL LOW (ref 250–450)
UIBC: 201 ug/dL

## 2020-09-13 LAB — CBC WITH DIFFERENTIAL/PLATELET
Abs Immature Granulocytes: 0.1 10*3/uL — ABNORMAL HIGH (ref 0.00–0.07)
Basophils Absolute: 0.1 10*3/uL (ref 0.0–0.1)
Basophils Relative: 1 %
Eosinophils Absolute: 0.2 10*3/uL (ref 0.0–0.5)
Eosinophils Relative: 2 %
HCT: 26.7 % — ABNORMAL LOW (ref 39.0–52.0)
Hemoglobin: 8.8 g/dL — ABNORMAL LOW (ref 13.0–17.0)
Immature Granulocytes: 1 %
Lymphocytes Relative: 9 %
Lymphs Abs: 1.1 10*3/uL (ref 0.7–4.0)
MCH: 28.3 pg (ref 26.0–34.0)
MCHC: 33 g/dL (ref 30.0–36.0)
MCV: 85.9 fL (ref 80.0–100.0)
Monocytes Absolute: 0.6 10*3/uL (ref 0.1–1.0)
Monocytes Relative: 5 %
Neutro Abs: 9.5 10*3/uL — ABNORMAL HIGH (ref 1.7–7.7)
Neutrophils Relative %: 82 %
Platelets: 141 10*3/uL — ABNORMAL LOW (ref 150–400)
RBC: 3.11 MIL/uL — ABNORMAL LOW (ref 4.22–5.81)
RDW: 15.8 % — ABNORMAL HIGH (ref 11.5–15.5)
WBC: 11.5 10*3/uL — ABNORMAL HIGH (ref 4.0–10.5)
nRBC: 0 % (ref 0.0–0.2)

## 2020-09-13 LAB — COMPREHENSIVE METABOLIC PANEL
ALT: 42 U/L (ref 0–44)
AST: 29 U/L (ref 15–41)
Albumin: 3.4 g/dL — ABNORMAL LOW (ref 3.5–5.0)
Alkaline Phosphatase: 60 U/L (ref 38–126)
Anion gap: 8 (ref 5–15)
BUN: 31 mg/dL — ABNORMAL HIGH (ref 8–23)
CO2: 27 mmol/L (ref 22–32)
Calcium: 9.1 mg/dL (ref 8.9–10.3)
Chloride: 104 mmol/L (ref 98–111)
Creatinine, Ser: 2.11 mg/dL — ABNORMAL HIGH (ref 0.61–1.24)
GFR calc Af Amer: 30 mL/min — ABNORMAL LOW (ref >60)
GFR calc non Af Amer: 26 mL/min — ABNORMAL LOW (ref >60)
Glucose, Bld: 104 mg/dL — ABNORMAL HIGH (ref 70–99)
Potassium: 4.3 mmol/L (ref 3.5–5.1)
Sodium: 139 mmol/L (ref 135–145)
Total Bilirubin: 0.6 mg/dL (ref 0.3–1.2)
Total Protein: 6.5 g/dL (ref 6.5–8.1)

## 2020-09-13 LAB — FERRITIN: Ferritin: 394 ng/mL — ABNORMAL HIGH (ref 24–336)

## 2020-09-13 NOTE — Progress Notes (Signed)
Patient daughter stated that his kidney doctor was going to contact Dr.Rao because his kidney doctor notice since he has not had an infusion in awhile his kidney levels are dropping.

## 2020-09-14 NOTE — Progress Notes (Signed)
Hematology/Oncology Consult note [facility]  [phone] [phone]  Patient Care Team: Albina Billet, MD as PCP - General (Internal Medicine) Sindy Guadeloupe, MD as Consulting Physician (Hematology and Oncology)   Name of the patient: Devin Washington  [id number]  30-Jun-1923   Date of visit: 09/14/20  Diagnosis-anemia likely multifactorial secondary to iron deficiency and chronic kidney disease  Chief complaint/ Reason for visit-routine follow-up of anemia  Heme/Onc history: patient is a 86 year old male with a past medical history significant for atrial fibrillation, CHF, hypertension and prostate cancer who presented to the ER after he suffered a fall. On admission he was found to have H&H of 7.1/22.2 with an MCV of 80.1 and a platelet count of 268.BMP showed an elevated creatinine of 1.9. TSH was normal at 0.52.Patient received 1 unit of PRBC in the ER. Ferritin checked per day later was 152. Iron studies revealed low serum iron of 27, low TIBC of 229 and low iron saturation of 12%. B12 levels were low normal at 264. Folate level was normal at 17.5. Reticulocyte count was low at 2% for the degree of anemia.Coombs test was negative and haptoglobin was normal. LDH was mildly elevated at 228. Myeloma panel showed M protein of IgG kappa 0.5 g. Both kappa and lambda free light chains were elevated with a normal free light chain ratio 0.77. E Po level was 22.3  Patient underwent EGD and colonoscopy on 04/04/2018. EGD showed nonobstructing nonbleeding superficial duodenal ulcers with a clean ulcer base. There are nonbleeding erosions in the gastric antrum andno stigmata of recent bleeding.Duodenum were suspicious for H. pylori.Colonoscopy showed hemorrhoids and 7 mm cecalpolyp  Interval history-reports feeling at his usual state of health.  Other than mild fatigue denies other complaints at this time.  Kidney numbers have been  getting worse and he follows up with nephrology for the same  ECOG PS- 2   Review of systems- Review of Systems  Constitutional: Positive for malaise/fatigue. Negative for chills, fever and weight loss.  HENT: Negative for congestion, ear discharge and nosebleeds.   Eyes: Negative for blurred vision.  Respiratory: Negative for cough, hemoptysis, sputum production, shortness of breath and wheezing.   Cardiovascular: Negative for chest pain, palpitations, orthopnea and claudication.  Gastrointestinal: Negative for abdominal pain, blood in stool, constipation, diarrhea, heartburn, melena, nausea and vomiting.  Genitourinary: Negative for dysuria, flank pain, frequency, hematuria and urgency.  Musculoskeletal: Negative for back pain, joint pain and myalgias.  Skin: Negative for rash.  Neurological: Negative for dizziness, tingling, focal weakness, seizures, weakness and headaches.  Endo/Heme/Allergies: Does not bruise/bleed easily.  Psychiatric/Behavioral: Negative for depression and suicidal ideas. The patient does not have insomnia.       No Known Allergies   Past Medical History:  Diagnosis Date  . A-fib ([place])   . Cardiomyopathy ([place])   . CHF (congestive heart failure) ([place])   . DJD (degenerative joint disease)   . Duodenal ulcer 08/09/2015  . Dysrhythmia   . Erosive esophagitis   . HH (hiatus hernia)   . HLD (hyperlipidemia)   . Hypertension   . Pacemaker   . PAD (peripheral artery disease) ([place])   . Peripheral vascular disease ([place])   . Presence of permanent cardiac pacemaker   . Prostate cancer [facility])    Prostatectomy.  . 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 Shortness of breath   . SSS (sick sinus syndrome) [facility])      Past Surgical History:  Procedure Laterality  Date  . CATARACT EXTRACTION, BILATERAL    . COLONOSCOPY N/A 04/04/2018   Procedure: COLONOSCOPY;  Surgeon: Lin Landsman, MD;  Location: [facility] ENDOSCOPY;  Service: Gastroenterology;  Laterality: N/A;  .  ESOPHAGOGASTRODUODENOSCOPY N/A 08/09/2015   Procedure: ESOPHAGOGASTRODUODENOSCOPY (EGD) looking in the stomach with a lighted tube to evaluate and treat;  Surgeon: Lollie Sails, MD;  Location: [facility] ENDOSCOPY;  Service: Endoscopy;  Laterality: N/A;  Procedure to be done tomorrow afternoon, 08/08/2015. Awaiting cardiology consult  . ESOPHAGOGASTRODUODENOSCOPY N/A 04/04/2018   Procedure: ESOPHAGOGASTRODUODENOSCOPY (EGD);  Surgeon: Lin Landsman, MD;  Location: [facility] ENDOSCOPY;  Service: Gastroenterology;  Laterality: N/A;  . EYE SURGERY    . FEMORAL-POPLITEAL BYPASS GRAFT Left 12/16/2017   Procedure: BYPASS GRAFT FEMORAL-POPLITEAL ARTERY ( OPEN POPLITEAL BYPASS );  Surgeon: Algernon Huxley, MD;  Location: [facility] ORS;  Service: Vascular;  Laterality: Left;  . FLEXIBLE SIGMOIDOSCOPY N/A 08/09/2015   Procedure: FLEXIBLE SIGMOIDOSCOPY looking up the rectum into the distal colon with a lighted tube to examine and treat;  Surgeon: Lollie Sails, MD;  Location: [facility] ENDOSCOPY;  Service: Endoscopy;  Laterality: N/A;  Procedure to be done tomorrow afternoon, 08/08/2015. Awaiting cardiology consult.  . INSERT / REPLACE / REMOVE PACEMAKER    . JOINT REPLACEMENT Left    left knee replacement  . LOWER EXTREMITY ANGIOGRAPHY Left 11/16/2017   Procedure: LOWER EXTREMITY ANGIOGRAPHY;  Surgeon: Algernon Huxley, MD;  Location: [place] CV LAB;  Service: Cardiovascular;  Laterality: Left;  . LOWER EXTREMITY ANGIOGRAPHY Right 11/30/2017   Procedure: LOWER EXTREMITY ANGIOGRAPHY;  Surgeon: Algernon Huxley, MD;  Location: [place] CV LAB;  Service: Cardiovascular;  Laterality: Right;  . LOWER EXTREMITY ANGIOGRAPHY Left 03/04/2018   Procedure: LOWER EXTREMITY ANGIOGRAPHY;  Surgeon: Algernon Huxley, MD;  Location: [place] CV LAB;  Service: Cardiovascular;  Laterality: Left;  . LOWER EXTREMITY ANGIOGRAPHY Left 04/12/2018   Procedure: LOWER EXTREMITY ANGIOGRAPHY;  Surgeon: Algernon Huxley, MD;  Location: [place] CV  LAB;  Service: Cardiovascular;  Laterality: Left;  . PACEMAKER INSERTION    . PROSTATE SURGERY    . PROSTATE SURGERY      Social History   Socioeconomic History  . Marital status: Widowed    Spouse name: Not on file  . Number of children: Not on file  . Years of education: Not on file  . Highest education level: Not on file  Occupational History    Employer: RETIRED/[employer]  Tobacco Use  . Smoking status: Former Smoker    Quit date: 03/02/1958    Years since quitting: 62.5  . Smokeless tobacco: Former Systems developer    Types: Chew, Snuff  Vaping Use  . Vaping Use: Never used  Substance and Sexual Activity  . Alcohol use: No    Alcohol/week: 0.0 standard drinks  . Drug use: No  . Sexual activity: Not Currently  Other Topics Concern  . Not on file  Social History Narrative  . Not on file   Social Determinants of Health   Financial Resource Strain:   . Difficulty of Paying Living Expenses: Not on file  Food Insecurity:   . Worried About Charity fundraiser in the Last Year: Not on file  . Ran Out of Food in the Last Year: Not on file  Transportation Needs:   . Lack of Transportation (Medical): Not on file  . Lack of Transportation (Non-Medical): Not on file  Physical Activity:   . Days of Exercise per  Week: Not on file  . Minutes of Exercise per Session: Not on file  Stress:   . Feeling of Stress : Not on file  Social Connections:   . Frequency of Communication with Friends and Family: Not on file  . Frequency of Social Gatherings with Friends and Family: Not on file  . Attends Religious Services: Not on file  . Active Member of Clubs or Organizations: Not on file  . Attends Archivist Meetings: Not on file  . Marital Status: Not on file  Intimate Partner Violence:   . Fear of Current or Ex-Partner: Not on file  . Emotionally Abused: Not on file  . Physically Abused: Not on file  . Sexually Abused: Not on file    Family History  Problem  Relation Age of Onset  . Lung cancer Father      Current Outpatient Medications:  .  acetaminophen (TYLENOL) 325 MG tablet, Take by mouth., Disp: , Rfl:  .  albuterol (PROVENTIL HFA;VENTOLIN HFA) 108 (90 Base) MCG/ACT inhaler, Inhale 2 puffs into the lungs every 6 (six) hours as needed for wheezing or shortness of breath., Disp: 1 Inhaler, Rfl: 2 .  amLODipine (NORVASC) 10 MG tablet, Take 10 mg by mouth daily. , Disp: , Rfl:  .  ANORO ELLIPTA 62.5-25 MCG/INH AEPB, Inhale 1 puff into the lungs 2 (two) times daily., Disp: , Rfl:  .  aspirin 81 MG EC tablet, Take by mouth., Disp: , Rfl:  .  benazepril (LOTENSIN) 20 MG tablet, Take 1 tablet (20 mg total) by mouth daily., Disp: 30 tablet, Rfl: 0 .  diphenhydrAMINE (BENADRYL) 25 mg capsule, Take by mouth., Disp: , Rfl:  .  feeding supplement, ENSURE ENLIVE, (ENSURE ENLIVE) LIQD, Take by mouth., Disp: , Rfl:  .  ferrous sulfate 325 (65 FE) MG tablet, Take 1 tablet (325 mg total) by mouth 2 (two) times daily with a meal. (Patient taking differently: Take 325 mg by mouth daily with breakfast. ), Disp: 60 tablet, Rfl: 3 .  furosemide (LASIX) 20 MG tablet, Take 1 tablet by mouth daily., Disp: , Rfl:  .  metoprolol tartrate (LOPRESSOR) 25 MG tablet, Take 1 tablet (25 mg total) by mouth 2 (two) times daily., Disp: 60 tablet, Rfl: 0 .  polyethylene glycol powder (GLYCOLAX/MIRALAX) 17 GM/SCOOP powder, Take by mouth., Disp: , Rfl:  .  simvastatin (ZOCOR) 20 MG tablet, Take 20 mg by mouth daily. , Disp: , Rfl:  .  simvastatin (ZOCOR) 40 MG tablet, Take 40 mg by mouth daily., Disp: , Rfl:  .  traZODone (DESYREL) 50 MG tablet, Take 1 tablet by mouth at bedtime as needed for sleep. , Disp: , Rfl:  .  vitamin B-12 1000 MCG tablet, Take 1 tablet (1,000 mcg total) by mouth daily., Disp: 60 tablet, Rfl: 1 .  traMADol (ULTRAM) 50 MG tablet, Take 1 tablet (50 mg total) by mouth every 6 (six) hours as needed for moderate pain or severe pain. (Patient not taking: Reported  on 06/29/2020), Disp: 30 tablet, Rfl: 0 No current facility-administered medications for this visit.  Facility-Administered Medications Ordered in Other Visits:  .  Darbepoetin Alfa (ARANESP) injection 100 mcg, 100 mcg, Subcutaneous, Q28 days, Sindy Guadeloupe, MD, 100 mcg at 04/16/18 1505  Physical exam:  Vitals:   09/13/20 1052  BP: (!) 158/84  Pulse: 63  Resp: 20  Temp: 97.6 F (36.4 C)  SpO2: 100%  Weight: 171 lb (77.6 kg)   Physical Exam Constitutional:  Comments: Elderly gentleman sitting in a wheelchair.  Appears in no acute distress  Cardiovascular:     Rate and Rhythm: Normal rate and regular rhythm.     Heart sounds: Normal heart sounds.  Pulmonary:     Effort: Pulmonary effort is normal.     Breath sounds: Normal breath sounds.  Abdominal:     General: Bowel sounds are normal.     Palpations: Abdomen is soft.  Skin:    General: Skin is warm and dry.  Neurological:     Mental Status: He is alert and oriented to person, place, and time.      CMP Latest Ref Rng & Units 09/13/2020  Glucose 70 - 99 mg/dL 104(H)  BUN 8 - 23 mg/dL 31(H)  Creatinine 0.61 - 1.24 mg/dL 2.11(H)  Sodium 135 - 145 mmol/L 139  Potassium 3.5 - 5.1 mmol/L 4.3  Chloride 98 - 111 mmol/L 104  CO2 22 - 32 mmol/L 27  Calcium 8.9 - 10.3 mg/dL 9.1  Total Protein 6.5 - 8.1 g/dL 6.5  Total Bilirubin 0.3 - 1.2 mg/dL 0.6  Alkaline Phos 38 - 126 U/L 60  AST 15 - 41 U/L 29  ALT 0 - 44 U/L 42   CBC Latest Ref Rng & Units 09/13/2020  WBC 4.0 - 10.5 K/uL 11.5(H)  Hemoglobin 13.0 - 17.0 g/dL 8.8(L)  Hematocrit 39 - 52 % 26.7(L)  Platelets 150 - 400 K/uL 141(L)     Assessment and plan- Patient is a 86 y.o. male with history of anemia of chronic kidney disease and iron deficiency here for routine follow-up  Patient had required iron S for his anemia back in 2019 but since then his hemoglobin improved and was remaining close to 10 and did not require EPO.  Most recently his hemoglobin has been  trending down and today his hemoglobin is 8.8/26.7 iron studies showed an elevated ferritin of 394 and iron saturation of 15%.  CMP shows serum creatinine of 2.1.  Given that his ferritin is more than 200 he would likely not benefit from IV iron at this time.  I would recommend giving him Retacrit 40,000 units every 3 weeks for anemia of chronic kidney disease to keep his hemoglobin between 10-11.  Discussed risks and benefits of Retacrit including all but not limited to risk of thromboembolic events if the hemoglobin is increased to greater than 11.  Patient understands and agrees to proceed as planned.  He will continue to get H&H checked every [redacted] weeks along with Retacrit and I will see him back in 3 months with CBC ferritin and iron studies and CMP   Visit Diagnosis 1. Anemia of chronic kidney failure, unspecified stage   2. Iron deficiency anemia, unspecified iron deficiency anemia type      Dr. Randa Evens, MD, MPH [facility] at [facility] [phone] 09/14/2020 2:28 PM

## 2020-09-17 ENCOUNTER — Inpatient Hospital Stay: Payer: Medicare HMO | Attending: Oncology

## 2020-10-03 ENCOUNTER — Other Ambulatory Visit: Payer: Self-pay

## 2020-10-03 ENCOUNTER — Ambulatory Visit (INDEPENDENT_AMBULATORY_CARE_PROVIDER_SITE_OTHER): Payer: Medicare HMO | Admitting: Nurse Practitioner

## 2020-10-03 ENCOUNTER — Telehealth (INDEPENDENT_AMBULATORY_CARE_PROVIDER_SITE_OTHER): Payer: Self-pay | Admitting: Nurse Practitioner

## 2020-10-03 VITALS — BP 157/70 | HR 76 | Ht 70.0 in | Wt 164.0 lb

## 2020-10-03 DIAGNOSIS — I890 Lymphedema, not elsewhere classified: Secondary | ICD-10-CM | POA: Diagnosis not present

## 2020-10-03 NOTE — Telephone Encounter (Signed)
Patient is interested in home health. Made nurses aware.

## 2020-10-03 NOTE — Progress Notes (Signed)
History of Present Illness  There is no documented history at this time  Assessments & Plan   There are no diagnoses linked to this encounter.    Additional instructions  Subjective:  Patient presents with venous ulcer of the Left lower extremity.    Procedure:  3 layer unna wrap was placed Left lower extremity.   Plan:   Follow up in one week.  

## 2020-10-04 ENCOUNTER — Telehealth (INDEPENDENT_AMBULATORY_CARE_PROVIDER_SITE_OTHER): Payer: Self-pay

## 2020-10-04 ENCOUNTER — Inpatient Hospital Stay: Payer: Medicare HMO

## 2020-10-04 NOTE — Telephone Encounter (Signed)
Patient granddaughter requested for weekly unna wraps to be done by home health. Home heath ([facility]) was reached out but to start services due to patient insurance a office note will need to be sent with demographics and order. A detailed message was left on the patient granddaughter voicemail.

## 2020-10-07 ENCOUNTER — Encounter (INDEPENDENT_AMBULATORY_CARE_PROVIDER_SITE_OTHER): Payer: Self-pay | Admitting: Nurse Practitioner

## 2020-10-08 ENCOUNTER — Inpatient Hospital Stay: Payer: Medicare HMO

## 2020-10-08 ENCOUNTER — Other Ambulatory Visit: Payer: Self-pay | Admitting: Oncology

## 2020-10-08 ENCOUNTER — Inpatient Hospital Stay: Payer: Medicare HMO | Attending: Oncology

## 2020-10-08 ENCOUNTER — Other Ambulatory Visit: Payer: Self-pay

## 2020-10-08 VITALS — BP 155/69 | HR 71

## 2020-10-08 DIAGNOSIS — N189 Chronic kidney disease, unspecified: Secondary | ICD-10-CM | POA: Insufficient documentation

## 2020-10-08 DIAGNOSIS — D509 Iron deficiency anemia, unspecified: Secondary | ICD-10-CM

## 2020-10-08 DIAGNOSIS — D631 Anemia in chronic kidney disease: Secondary | ICD-10-CM | POA: Diagnosis not present

## 2020-10-08 LAB — HEMOGLOBIN AND HEMATOCRIT, BLOOD
HCT: 24.1 % — ABNORMAL LOW (ref 39.0–52.0)
Hemoglobin: 7.8 g/dL — ABNORMAL LOW (ref 13.0–17.0)

## 2020-10-08 MED ORDER — EPOETIN ALFA-EPBX 40000 UNIT/ML IJ SOLN
40000.0000 [IU] | Freq: Once | INTRAMUSCULAR | Status: AC
  Administered 2020-10-08: 40000 [IU] via SUBCUTANEOUS
  Filled 2020-10-08: qty 1

## 2020-10-08 MED ORDER — EPOETIN ALFA-EPBX 20000 UNIT/ML IJ SOLN: 50000.0000 [IU] | INTRAMUSCULAR | Status: DC

## 2020-10-08 MED ORDER — EPOETIN ALFA-EPBX 10000 UNIT/ML IJ SOLN
10000.0000 [IU] | Freq: Once | INTRAMUSCULAR | Status: AC
  Administered 2020-10-08: 10000 [IU] via SUBCUTANEOUS
  Filled 2020-10-08: qty 1

## 2020-10-11 ENCOUNTER — Inpatient Hospital Stay: Payer: Medicare HMO

## 2020-10-11 ENCOUNTER — Encounter (INDEPENDENT_AMBULATORY_CARE_PROVIDER_SITE_OTHER): Payer: Self-pay | Admitting: Nurse Practitioner

## 2020-10-11 ENCOUNTER — Other Ambulatory Visit: Payer: Self-pay

## 2020-10-11 ENCOUNTER — Ambulatory Visit (INDEPENDENT_AMBULATORY_CARE_PROVIDER_SITE_OTHER): Payer: Medicare HMO | Admitting: Nurse Practitioner

## 2020-10-11 VITALS — BP 163/72 | HR 76 | Resp 16 | Wt 164.0 lb

## 2020-10-11 DIAGNOSIS — I10 Essential (primary) hypertension: Secondary | ICD-10-CM | POA: Diagnosis not present

## 2020-10-11 DIAGNOSIS — I890 Lymphedema, not elsewhere classified: Secondary | ICD-10-CM | POA: Diagnosis not present

## 2020-10-12 ENCOUNTER — Encounter (INDEPENDENT_AMBULATORY_CARE_PROVIDER_SITE_OTHER): Payer: Self-pay | Admitting: Nurse Practitioner

## 2020-10-12 ENCOUNTER — Telehealth (INDEPENDENT_AMBULATORY_CARE_PROVIDER_SITE_OTHER): Payer: Self-pay

## 2020-10-12 NOTE — Telephone Encounter (Signed)
Home health orders for left unna wrap to be done weekly were faxed to [facility]

## 2020-10-12 NOTE — Progress Notes (Signed)
Subjective:    Patient ID: Devin Washington, male    DOB: Apr 08, 1923, 86 y.o.   MRN: [id number] Chief Complaint  Patient presents with  . Follow-up    unna check    The patient was placed in in wraps due to severe edema.  The patient was not quite ulcerated however some weeping was present.  He denies any severe pain or claudication.  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  The popliteal aneurysm has been repaired however due to the size of the remaining aneurysm, it is likely providing some venous compression.  It is also notable that the patient's arterial graft is palpable due to displacement by the aneurysm.  He denies any fever, chills, nausea, vomiting or diarrhea.  Overall he is tolerating the wrap well.   Review of Systems  Cardiovascular: Positive for leg swelling.  Neurological: Positive for weakness.  All other systems reviewed and are negative.      Objective:   Physical Exam Vitals reviewed.  HENT:     Head: Normocephalic.  Cardiovascular:     Rate and Rhythm: Normal rate.     Pulses: Normal pulses.  Pulmonary:     Effort: Pulmonary effort is normal.  Musculoskeletal:     Right lower leg: 1+ Edema present.     Left lower leg: 3+ Edema present.  Skin:    General: Skin is warm.  Neurological:     Mental Status: He is alert and oriented to person, place, and time. Mental status is at baseline.     Motor: Weakness present.     Gait: Gait abnormal.  Psychiatric:        Mood and Affect: Mood normal.        Behavior: Behavior normal.        Thought Content: Thought content normal.        Judgment: Judgment normal.     BP (!) 163/72 (BP Location: Right Arm)   Pulse 76   Resp 16   Wt 164 lb (74.4 kg)   BMI 23.53 kg/m   Past Medical History:  Diagnosis Date  . A-fib ([place])   . Cardiomyopathy ([place])   . CHF (congestive heart failure) ([place])   . DJD (degenerative joint disease)   . Duodenal ulcer 08/09/2015  .  Dysrhythmia   . Erosive esophagitis   . HH (hiatus hernia)   . HLD (hyperlipidemia)   . Hypertension   . Pacemaker   . PAD (peripheral artery disease) ([place])   . Peripheral vascular disease ([place])   . Presence of permanent cardiac pacemaker   . Prostate cancer [facility])    Prostatectomy.  . 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 Shortness of breath   . SSS (sick sinus syndrome) ([facility])     Social History   Socioeconomic History  . Marital status: Widowed    Spouse name: Not on file  . Number of children: Not on file  . Years of education: Not on file  . Highest education level: Not on file  Occupational History    Employer: RETIRED/[employer]  Tobacco Use  . Smoking status: Former Smoker    Quit date: 03/02/1958    Years since quitting: 62.6  . Smokeless tobacco: Former Systems developer    Types: Chew, Snuff  Vaping Use  . Vaping Use: Never used  Substance and Sexual Activity  . Alcohol use: No    Alcohol/week: 0.0 standard drinks  . Drug  use: No  . Sexual activity: Not Currently  Other Topics Concern  . Not on file  Social History Narrative  . Not on file   Social Determinants of Health   Financial Resource Strain:   . Difficulty of Paying Living Expenses: Not on file  Food Insecurity:   . Worried About Charity fundraiser in the Last Year: Not on file  . Ran Out of Food in the Last Year: Not on file  Transportation Needs:   . Lack of Transportation (Medical): Not on file  . Lack of Transportation (Non-Medical): Not on file  Physical Activity:   . Days of Exercise per Week: Not on file  . Minutes of Exercise per Session: Not on file  Stress:   . Feeling of Stress : Not on file  Social Connections:   . Frequency of Communication with Friends and Family: Not on file  . Frequency of Social Gatherings with Friends and Family: Not on file  . Attends Religious Services: Not on file  . Active Member of Clubs or Organizations: Not on file  . Attends Archivist Meetings:  Not on file  . Marital Status: Not on file  Intimate Partner Violence:   . Fear of Current or Ex-Partner: Not on file  . Emotionally Abused: Not on file  . Physically Abused: Not on file  . Sexually Abused: Not on file    Past Surgical History:  Procedure Laterality Date  . CATARACT EXTRACTION, BILATERAL    . COLONOSCOPY N/A 04/04/2018   Procedure: COLONOSCOPY;  Surgeon: Lin Landsman, MD;  Location: [facility] ENDOSCOPY;  Service: Gastroenterology;  Laterality: N/A;  . ESOPHAGOGASTRODUODENOSCOPY N/A 08/09/2015   Procedure: ESOPHAGOGASTRODUODENOSCOPY (EGD) looking in the stomach with a lighted tube to evaluate and treat;  Surgeon: Lollie Sails, MD;  Location: [facility] ENDOSCOPY;  Service: Endoscopy;  Laterality: N/A;  Procedure to be done tomorrow afternoon, 08/08/2015. Awaiting cardiology consult  . ESOPHAGOGASTRODUODENOSCOPY N/A 04/04/2018   Procedure: ESOPHAGOGASTRODUODENOSCOPY (EGD);  Surgeon: Lin Landsman, MD;  Location: [facility] ENDOSCOPY;  Service: Gastroenterology;  Laterality: N/A;  . EYE SURGERY    . FEMORAL-POPLITEAL BYPASS GRAFT Left 12/16/2017   Procedure: BYPASS GRAFT FEMORAL-POPLITEAL ARTERY ( OPEN POPLITEAL BYPASS );  Surgeon: Algernon Huxley, MD;  Location: [facility] ORS;  Service: Vascular;  Laterality: Left;  . FLEXIBLE SIGMOIDOSCOPY N/A 08/09/2015   Procedure: FLEXIBLE SIGMOIDOSCOPY looking up the rectum into the distal colon with a lighted tube to examine and treat;  Surgeon: Lollie Sails, MD;  Location: [facility] ENDOSCOPY;  Service: Endoscopy;  Laterality: N/A;  Procedure to be done tomorrow afternoon, 08/08/2015. Awaiting cardiology consult.  . INSERT / REPLACE / REMOVE PACEMAKER    . JOINT REPLACEMENT Left    left knee replacement  . LOWER EXTREMITY ANGIOGRAPHY Left 11/16/2017   Procedure: LOWER EXTREMITY ANGIOGRAPHY;  Surgeon: Algernon Huxley, MD;  Location: [place] CV LAB;  Service: Cardiovascular;  Laterality: Left;  . LOWER EXTREMITY ANGIOGRAPHY Right 11/30/2017    Procedure: LOWER EXTREMITY ANGIOGRAPHY;  Surgeon: Algernon Huxley, MD;  Location: [place] CV LAB;  Service: Cardiovascular;  Laterality: Right;  . LOWER EXTREMITY ANGIOGRAPHY Left 03/04/2018   Procedure: LOWER EXTREMITY ANGIOGRAPHY;  Surgeon: Algernon Huxley, MD;  Location: [place] CV LAB;  Service: Cardiovascular;  Laterality: Left;  . LOWER EXTREMITY ANGIOGRAPHY Left 04/12/2018   Procedure: LOWER EXTREMITY ANGIOGRAPHY;  Surgeon: Algernon Huxley, MD;  Location: [place] CV LAB;  Service: Cardiovascular;  Laterality: Left;  .  PACEMAKER INSERTION    . PROSTATE SURGERY    . PROSTATE SURGERY      Family History  Problem Relation Age of Onset  . Lung cancer Father     No Known Allergies  CBC Latest Ref Rng & Units 10/08/2020 09/13/2020 03/13/2020  WBC 4.0 - 10.5 K/uL - 11.5(H) 11.7(H)  Hemoglobin 13.0 - 17.0 g/dL 7.8(L) 8.8(L) 9.8(L)  Hematocrit 39 - 52 % 24.1(L) 26.7(L) 30.5(L)  Platelets 150 - 400 K/uL - 141(L) 176      CMP     Component Value Date/Time   NA 139 09/13/2020 1038   K 4.3 09/13/2020 1038   CL 104 09/13/2020 1038   CO2 27 09/13/2020 1038   GLUCOSE 104 (H) 09/13/2020 1038   BUN 31 (H) 09/13/2020 1038   CREATININE 2.11 (H) 09/13/2020 1038   CALCIUM 9.1 09/13/2020 1038   PROT 6.5 09/13/2020 1038   ALBUMIN 3.4 (L) 09/13/2020 1038   AST 29 09/13/2020 1038   ALT 42 09/13/2020 1038   ALKPHOS 60 09/13/2020 1038   BILITOT 0.6 09/13/2020 1038   GFRNONAA 26 (L) 09/13/2020 1038   GFRAA 30 (L) 09/13/2020 1038         Assessment & Plan:   1. Lymphedema The patient will continue to be placed in Unna wraps to help gain control the swelling.  The swelling is likely complicated by the patient's very large popliteal aneurysm.  Although it is repaired, it may be providing a component of venous compression.  Also based on the location it will be somewhat difficult to utilize regular compression stockings.  The patient should also continue with elevation of his lower  extremities in addition to ambulation when possible.  2. Primary hypertension Continue antihypertensive medications as already ordered, these medications have been reviewed and there are no changes at this time.    Current Outpatient Medications on File Prior to Visit  Medication Sig Dispense Refill  . acetaminophen (TYLENOL) 325 MG tablet Take by mouth.    Marland Kitchen albuterol (PROVENTIL HFA;VENTOLIN HFA) 108 (90 Base) MCG/ACT inhaler Inhale 2 puffs into the lungs every 6 (six) hours as needed for wheezing or shortness of breath. 1 Inhaler 2  . amLODipine (NORVASC) 10 MG tablet Take 10 mg by mouth daily.     Jearl Klinefelter ELLIPTA 62.5-25 MCG/INH AEPB Inhale 1 puff into the lungs 2 (two) times daily.    Marland Kitchen aspirin 81 MG EC tablet Take by mouth.    . benazepril (LOTENSIN) 20 MG tablet Take 1 tablet (20 mg total) by mouth daily. 30 tablet 0  . diphenhydrAMINE (BENADRYL) 25 mg capsule Take by mouth.    . feeding supplement, ENSURE ENLIVE, (ENSURE ENLIVE) LIQD Take by mouth.    . ferrous sulfate 325 (65 FE) MG tablet Take 1 tablet (325 mg total) by mouth 2 (two) times daily with a meal. (Patient taking differently: Take 325 mg by mouth daily with breakfast. ) 60 tablet 3  . furosemide (LASIX) 20 MG tablet Take 1 tablet by mouth daily.    . metoprolol tartrate (LOPRESSOR) 25 MG tablet Take 1 tablet (25 mg total) by mouth 2 (two) times daily. 60 tablet 0  . polyethylene glycol powder (GLYCOLAX/MIRALAX) 17 GM/SCOOP powder Take by mouth.    . simvastatin (ZOCOR) 20 MG tablet Take 20 mg by mouth daily.     . simvastatin (ZOCOR) 40 MG tablet Take 40 mg by mouth daily.    . traZODone (DESYREL) 50 MG tablet Take 1 tablet  by mouth at bedtime as needed for sleep.     . vitamin B-12 1000 MCG tablet Take 1 tablet (1,000 mcg total) by mouth daily. 60 tablet 1  . traMADol (ULTRAM) 50 MG tablet Take 1 tablet (50 mg total) by mouth every 6 (six) hours as needed for moderate pain or severe pain. (Patient not taking: Reported on  06/29/2020) 30 tablet 0   Current Facility-Administered Medications on File Prior to Visit  Medication Dose Route Frequency Provider Last Rate Last Admin  . Darbepoetin Alfa (ARANESP) injection 100 mcg  100 mcg Subcutaneous Q28 days Sindy Guadeloupe, MD   100 mcg at 04/16/18 1505    There are no Patient Instructions on file for this visit. No follow-ups on file.   Kris Hartmann, NP

## 2020-10-15 ENCOUNTER — Inpatient Hospital Stay: Payer: Medicare HMO

## 2020-10-25 ENCOUNTER — Inpatient Hospital Stay: Payer: Medicare HMO

## 2020-10-26 ENCOUNTER — Telehealth (INDEPENDENT_AMBULATORY_CARE_PROVIDER_SITE_OTHER): Payer: Self-pay

## 2020-10-26 NOTE — Telephone Encounter (Signed)
Angie with [facility] called to report that patient is having 3 plus pitting edema with his left foot and there is no changes with the aneurysm. The nurse informed the swelling has increase since but the foot is still warm and she is able to feel a pulse. I spoke with Dossie Arbour NP and she recommended  the patient should elevate his leg above heart level to help with swelling and the nurse can continue with wraps. The home health nurse was made aware with medical advice and verbalized that she will contact the office if there are any changes.

## 2020-10-29 ENCOUNTER — Inpatient Hospital Stay: Payer: Medicare HMO

## 2020-10-31 ENCOUNTER — Other Ambulatory Visit: Payer: Self-pay

## 2020-10-31 ENCOUNTER — Inpatient Hospital Stay: Payer: Medicare HMO

## 2020-10-31 ENCOUNTER — Inpatient Hospital Stay: Payer: Medicare HMO | Attending: Oncology

## 2020-10-31 VITALS — BP 157/68 | HR 79

## 2020-10-31 DIAGNOSIS — N189 Chronic kidney disease, unspecified: Secondary | ICD-10-CM | POA: Insufficient documentation

## 2020-10-31 DIAGNOSIS — D631 Anemia in chronic kidney disease: Secondary | ICD-10-CM | POA: Insufficient documentation

## 2020-10-31 DIAGNOSIS — D509 Iron deficiency anemia, unspecified: Secondary | ICD-10-CM

## 2020-10-31 LAB — HEMOGLOBIN AND HEMATOCRIT, BLOOD
HCT: 26.9 % — ABNORMAL LOW (ref 39.0–52.0)
Hemoglobin: 8.4 g/dL — ABNORMAL LOW (ref 13.0–17.0)

## 2020-10-31 MED ORDER — EPOETIN ALFA-EPBX 10000 UNIT/ML IJ SOLN
10000.0000 [IU] | Freq: Once | INTRAMUSCULAR | Status: AC
  Administered 2020-10-31: 10000 [IU] via SUBCUTANEOUS
  Filled 2020-10-31: qty 1

## 2020-10-31 MED ORDER — EPOETIN ALFA-EPBX 20000 UNIT/ML IJ SOLN: 50000.0000 [IU] | INTRAMUSCULAR | Status: DC

## 2020-10-31 MED ORDER — EPOETIN ALFA-EPBX 40000 UNIT/ML IJ SOLN
40000.0000 [IU] | Freq: Once | INTRAMUSCULAR | Status: AC
  Administered 2020-10-31: 40000 [IU] via SUBCUTANEOUS
  Filled 2020-10-31: qty 1

## 2020-11-01 ENCOUNTER — Inpatient Hospital Stay: Payer: Medicare HMO

## 2020-11-05 ENCOUNTER — Inpatient Hospital Stay: Payer: Medicare HMO

## 2020-11-12 ENCOUNTER — Ambulatory Visit (INDEPENDENT_AMBULATORY_CARE_PROVIDER_SITE_OTHER): Payer: Medicare HMO | Admitting: Nurse Practitioner

## 2020-11-12 ENCOUNTER — Other Ambulatory Visit: Payer: Self-pay

## 2020-11-12 ENCOUNTER — Encounter (INDEPENDENT_AMBULATORY_CARE_PROVIDER_SITE_OTHER): Payer: Self-pay | Admitting: Nurse Practitioner

## 2020-11-12 VITALS — BP 173/80 | HR 76 | Ht 70.0 in | Wt 161.0 lb

## 2020-11-12 DIAGNOSIS — I890 Lymphedema, not elsewhere classified: Secondary | ICD-10-CM

## 2020-11-12 DIAGNOSIS — I723 Aneurysm of iliac artery: Secondary | ICD-10-CM | POA: Diagnosis not present

## 2020-11-12 DIAGNOSIS — I10 Essential (primary) hypertension: Secondary | ICD-10-CM

## 2020-11-15 ENCOUNTER — Inpatient Hospital Stay: Payer: Medicare HMO

## 2020-11-18 ENCOUNTER — Encounter (INDEPENDENT_AMBULATORY_CARE_PROVIDER_SITE_OTHER): Payer: Self-pay | Admitting: Nurse Practitioner

## 2020-11-18 NOTE — Progress Notes (Signed)
Subjective:    Patient ID: Devin Washington, male    DOB: 20-Sep-1923, 86 y.o.   MRN: [id number] Chief Complaint  Patient presents with  . Follow-up    4 wk Bil unna boot check    The patient was placed in in wraps due to severe edema.  The patient was not quite ulcerated but the weeping has improved.  He denies any severe pain or claudication.  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  The popliteal aneurysm has been repaired however due to the size of the remaining aneurysm, it is likely providing some venous compression.  It is also notable that the patient's arterial graft is palpable due to displacement by the aneurysm.  He denies any fever, chills, nausea, vomiting or diarrhea.  Overall he is tolerating the wrap well.    Review of Systems  Cardiovascular: Positive for leg swelling.  Neurological: Positive for weakness.  All other systems reviewed and are negative.      Objective:   Physical Exam Vitals reviewed.  HENT:     Head: Normocephalic.  Cardiovascular:     Rate and Rhythm: Normal rate.  Pulmonary:     Effort: Pulmonary effort is normal.  Musculoskeletal:     Right lower leg: Edema present.     Left lower leg: Edema present.  Neurological:     Mental Status: He is alert and oriented to person, place, and time.  Psychiatric:        Mood and Affect: Mood normal.        Behavior: Behavior normal.        Thought Content: Thought content normal.        Judgment: Judgment normal.     BP (!) 173/80   Pulse 76   Ht 5\' 10"  (1.778 m)   Wt 161 lb (73 kg)   BMI 23.10 kg/m   Past Medical History:  Diagnosis Date  . A-fib ([place])   . Cardiomyopathy ([place])   . CHF (congestive heart failure) ([place])   . DJD (degenerative joint disease)   . Duodenal ulcer 08/09/2015  . Dysrhythmia   . Erosive esophagitis   . HH (hiatus hernia)   . HLD (hyperlipidemia)   . Hypertension   . Pacemaker   . PAD (peripheral artery disease)  ([place])   . Peripheral vascular disease ([place])   . Presence of permanent cardiac pacemaker   . Prostate cancer [facility])    Prostatectomy.  . 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 Shortness of breath   . SSS (sick sinus syndrome) (HCC)     Social History   Socioeconomic History  . Marital status: Widowed    Spouse name: Not on file  . Number of children: Not on file  . Years of education: Not on file  . Highest education level: Not on file  Occupational History    Employer: RETIRED/[employer]  Tobacco Use  . Smoking status: Former Smoker    Quit date: 03/02/1958    Years since quitting: 62.7  . Smokeless tobacco: Former Systems developer    Types: Chew, Snuff  Vaping Use  . Vaping Use: Never used  Substance and Sexual Activity  . Alcohol use: No    Alcohol/week: 0.0 standard drinks  . Drug use: No  . Sexual activity: Not Currently  Other Topics Concern  . Not on file  Social History Narrative  . Not on file   Social Determinants of Health  Financial Resource Strain:   . Difficulty of Paying Living Expenses: Not on file  Food Insecurity:   . Worried About Charity fundraiser in the Last Year: Not on file  . Ran Out of Food in the Last Year: Not on file  Transportation Needs:   . Lack of Transportation (Medical): Not on file  . Lack of Transportation (Non-Medical): Not on file  Physical Activity:   . Days of Exercise per Week: Not on file  . Minutes of Exercise per Session: Not on file  Stress:   . Feeling of Stress : Not on file  Social Connections:   . Frequency of Communication with Friends and Family: Not on file  . Frequency of Social Gatherings with Friends and Family: Not on file  . Attends Religious Services: Not on file  . Active Member of Clubs or Organizations: Not on file  . Attends Archivist Meetings: Not on file  . Marital Status: Not on file  Intimate Partner Violence:   . Fear of Current or Ex-Partner: Not on file  . Emotionally Abused: Not on file   . Physically Abused: Not on file  . Sexually Abused: Not on file    Past Surgical History:  Procedure Laterality Date  . CATARACT EXTRACTION, BILATERAL    . COLONOSCOPY N/A 04/04/2018   Procedure: COLONOSCOPY;  Surgeon: Lin Landsman, MD;  Location: [facility] ENDOSCOPY;  Service: Gastroenterology;  Laterality: N/A;  . ESOPHAGOGASTRODUODENOSCOPY N/A 08/09/2015   Procedure: ESOPHAGOGASTRODUODENOSCOPY (EGD) looking in the stomach with a lighted tube to evaluate and treat;  Surgeon: Lollie Sails, MD;  Location: [facility] ENDOSCOPY;  Service: Endoscopy;  Laterality: N/A;  Procedure to be done tomorrow afternoon, 08/08/2015. Awaiting cardiology consult  . ESOPHAGOGASTRODUODENOSCOPY N/A 04/04/2018   Procedure: ESOPHAGOGASTRODUODENOSCOPY (EGD);  Surgeon: Lin Landsman, MD;  Location: [facility] ENDOSCOPY;  Service: Gastroenterology;  Laterality: N/A;  . EYE SURGERY    . FEMORAL-POPLITEAL BYPASS GRAFT Left 12/16/2017   Procedure: BYPASS GRAFT FEMORAL-POPLITEAL ARTERY ( OPEN POPLITEAL BYPASS );  Surgeon: Algernon Huxley, MD;  Location: [facility] ORS;  Service: Vascular;  Laterality: Left;  . FLEXIBLE SIGMOIDOSCOPY N/A 08/09/2015   Procedure: FLEXIBLE SIGMOIDOSCOPY looking up the rectum into the distal colon with a lighted tube to examine and treat;  Surgeon: Lollie Sails, MD;  Location[facility] END[facility]COPY;  Service: Endoscopy;  Laterality: N/A;  Procedure to be done tomorrow afternoon, 08/08/2015. Awaiting cardiology consult.  . INSERT / REPLACE / REMOVE PACEMAKER    . JOINT REPLACEMENT Left    left knee replacement  . LOWER EXTREMITY ANGIOGRAPHY Left 11/16/2017   Procedure: LOWER EXTREMITY ANGIOGRAPHY;  Surgeon: Algernon Huxley, MD;  Location: [place] CV LAB;  Service: Cardiovascular;  Laterality: Left;  . LOWER EXTREMITY ANGIOGRAPHY Right 11/30/2017   Procedure: LOWER EXTREMITY ANGIOGRAPHY;  Surgeon: Algernon Huxley, MD;  Location: [place] CV LAB;  Service: Cardiovascular;  Laterality: Right;  . LOWER  EXTREMITY ANGIOGRAPHY Left 03/04/2018   Procedure: LOWER EXTREMITY ANGIOGRAPHY;  Surgeon: Algernon Huxley, MD;  Location: [place] CV LAB;  Service: Cardiovascular;  Laterality: Left;  . LOWER EXTREMITY ANGIOGRAPHY Left 04/12/2018   Procedure: LOWER EXTREMITY ANGIOGRAPHY;  Surgeon: Algernon Huxley, MD;  Location: [place] CV LAB;  Service: Cardiovascular;  Laterality: Left;  . PACEMAKER INSERTION    . PROSTATE SURGERY    . PROSTATE SURGERY      Family History  Problem Relation Age of Onset  . Lung cancer Father  No Known Allergies  CBC Latest Ref Rng & Units 10/31/2020 10/08/2020 09/13/2020  WBC 4.0 - 10.5 K/uL - - 11.5(H)  Hemoglobin 13.0 - 17.0 g/dL 8.4(L) 7.8(L) 8.8(L)  Hematocrit 39 - 52 % 26.9(L) 24.1(L) 26.7(L)  Platelets 150 - 400 K/uL - - 141(L)      CMP     Component Value Date/Time   NA 139 09/13/2020 1038   K 4.3 09/13/2020 1038   CL 104 09/13/2020 1038   CO2 27 09/13/2020 1038   GLUCOSE 104 (H) 09/13/2020 1038   BUN 31 (H) 09/13/2020 1038   CREATININE 2.11 (H) 09/13/2020 1038   CALCIUM 9.1 09/13/2020 1038   PROT 6.5 09/13/2020 1038   ALBUMIN 3.4 (L) 09/13/2020 1038   AST 29 09/13/2020 1038   ALT 42 09/13/2020 1038   ALKPHOS 60 09/13/2020 1038   BILITOT 0.6 09/13/2020 1038   GFRNONAA 26 (L) 09/13/2020 1038   GFRAA 30 (L) 09/13/2020 1038     No results found.     Assessment & Plan:   1. Lymphedema No surgery or intervention at this point in time.    I have had a long discussion with the patient regarding venous insufficiency and why it  causes symptoms, specifically venous ulceration . I have discussed with the patient the chronic skin changes that accompany venous insufficiency and the long term sequela such as infection and recurring  ulceration.  Patient will be placed in [employer] which will be changed weekly drainage permitting.  In addition, behavioral modification including several periods of elevation of the lower extremities during the  day will be continued. Achieving a position with the ankles at heart level was stressed to the patient  The patient is instructed to begin routine exercise, especially walking on a daily basis  Patient will return in six weeks for evaluation of the wound  2. Primary hypertension Continue antihypertensive medications as already ordered, these medications have been reviewed and there are no changes at this time.   3. Iliac artery aneurysm (HCC) No evident symptoms.  We will have an ultrasound with the patient returns in 6 weeks to evaluate.   Current Outpatient Medications on File Prior to Visit  Medication Sig Dispense Refill  . acetaminophen (TYLENOL) 325 MG tablet Take by mouth.    Marland Kitchen albuterol (PROVENTIL HFA;VENTOLIN HFA) 108 (90 Base) MCG/ACT inhaler Inhale 2 puffs into the lungs every 6 (six) hours as needed for wheezing or shortness of breath. 1 Inhaler 2  . amLODipine (NORVASC) 10 MG tablet Take 10 mg by mouth daily.     Jearl Klinefelter ELLIPTA 62.5-25 MCG/INH AEPB Inhale 1 puff into the lungs 2 (two) times daily.    Marland Kitchen aspirin 81 MG EC tablet Take by mouth.    . benazepril (LOTENSIN) 20 MG tablet Take 1 tablet (20 mg total) by mouth daily. 30 tablet 0  . diphenhydrAMINE (BENADRYL) 25 mg capsule Take by mouth.    . feeding supplement, ENSURE ENLIVE, (ENSURE ENLIVE) LIQD Take by mouth.    . ferrous sulfate 325 (65 FE) MG tablet Take 1 tablet (325 mg total) by mouth 2 (two) times daily with a meal. (Patient taking differently: Take 325 mg by mouth daily with breakfast. ) 60 tablet 3  . furosemide (LASIX) 20 MG tablet Take 1 tablet by mouth daily.    . metoprolol tartrate (LOPRESSOR) 25 MG tablet Take 1 tablet (25 mg total) by mouth 2 (two) times daily. 60 tablet 0  . polyethylene glycol powder (  GLYCOLAX/MIRALAX) 17 GM/SCOOP powder Take by mouth.    . simvastatin (ZOCOR) 20 MG tablet Take 20 mg by mouth daily.     . simvastatin (ZOCOR) 40 MG tablet Take 40 mg by mouth daily.    . traMADol  (ULTRAM) 50 MG tablet Take 1 tablet (50 mg total) by mouth every 6 (six) hours as needed for moderate pain or severe pain. 30 tablet 0  . traZODone (DESYREL) 50 MG tablet Take 1 tablet by mouth at bedtime as needed for sleep.     . vitamin B-12 1000 MCG tablet Take 1 tablet (1,000 mcg total) by mouth daily. 60 tablet 1   Current Facility-Administered Medications on File Prior to Visit  Medication Dose Route Frequency Provider Last Rate Last Admin  . Darbepoetin Alfa (ARANESP) injection 100 mcg  100 mcg Subcutaneous Q28 days Sindy Guadeloupe, MD   100 mcg at 04/16/18 1505    There are no Patient Instructions on file for this visit. No follow-ups on file.   Kris Hartmann, NP

## 2020-11-19 ENCOUNTER — Inpatient Hospital Stay: Payer: Medicare HMO

## 2020-11-19 ENCOUNTER — Inpatient Hospital Stay: Payer: Medicare HMO | Attending: Oncology

## 2020-11-19 VITALS — BP 145/63 | HR 68

## 2020-11-19 DIAGNOSIS — N189 Chronic kidney disease, unspecified: Secondary | ICD-10-CM | POA: Diagnosis present

## 2020-11-19 DIAGNOSIS — D509 Iron deficiency anemia, unspecified: Secondary | ICD-10-CM

## 2020-11-19 DIAGNOSIS — D631 Anemia in chronic kidney disease: Secondary | ICD-10-CM | POA: Diagnosis not present

## 2020-11-19 LAB — HEMOGLOBIN AND HEMATOCRIT, BLOOD
HCT: 27.4 % — ABNORMAL LOW (ref 39.0–52.0)
Hemoglobin: 8.7 g/dL — ABNORMAL LOW (ref 13.0–17.0)

## 2020-11-19 MED ORDER — EPOETIN ALFA-EPBX 20000 UNIT/ML IJ SOLN: 50000.0000 [IU] | INTRAMUSCULAR | Status: DC

## 2020-11-19 MED ORDER — EPOETIN ALFA-EPBX 10000 UNIT/ML IJ SOLN
10000.0000 [IU] | Freq: Once | INTRAMUSCULAR | Status: AC
  Administered 2020-11-19: 10000 [IU] via SUBCUTANEOUS
  Filled 2020-11-19: qty 1

## 2020-11-19 MED ORDER — EPOETIN ALFA-EPBX 40000 UNIT/ML IJ SOLN
40000.0000 [IU] | Freq: Once | INTRAMUSCULAR | Status: AC
  Administered 2020-11-19: 40000 [IU] via SUBCUTANEOUS
  Filled 2020-11-19: qty 1

## 2020-12-06 ENCOUNTER — Ambulatory Visit: Payer: Medicare HMO | Admitting: Oncology

## 2020-12-06 ENCOUNTER — Inpatient Hospital Stay: Payer: Medicare HMO

## 2020-12-10 ENCOUNTER — Inpatient Hospital Stay: Payer: Medicare HMO

## 2020-12-10 ENCOUNTER — Other Ambulatory Visit: Payer: Self-pay | Admitting: *Deleted

## 2020-12-10 ENCOUNTER — Inpatient Hospital Stay: Payer: Medicare HMO | Admitting: Oncology

## 2020-12-10 DIAGNOSIS — D509 Iron deficiency anemia, unspecified: Secondary | ICD-10-CM

## 2020-12-10 DIAGNOSIS — N189 Chronic kidney disease, unspecified: Secondary | ICD-10-CM

## 2020-12-10 DIAGNOSIS — D631 Anemia in chronic kidney disease: Secondary | ICD-10-CM

## 2020-12-15 DIAGNOSIS — I495 Sick sinus syndrome: Secondary | ICD-10-CM | POA: Diagnosis present

## 2020-12-15 DIAGNOSIS — D509 Iron deficiency anemia, unspecified: Secondary | ICD-10-CM | POA: Diagnosis present

## 2020-12-15 DIAGNOSIS — Z950 Presence of cardiac pacemaker: Secondary | ICD-10-CM | POA: Diagnosis present

## 2020-12-15 DIAGNOSIS — I480 Paroxysmal atrial fibrillation: Secondary | ICD-10-CM | POA: Diagnosis present

## 2020-12-15 DIAGNOSIS — N1832 Chronic kidney disease, stage 3b: Secondary | ICD-10-CM | POA: Diagnosis present

## 2020-12-15 DIAGNOSIS — I5033 Acute on chronic diastolic (congestive) heart failure: Secondary | ICD-10-CM | POA: Diagnosis present

## 2020-12-20 ENCOUNTER — Inpatient Hospital Stay: Payer: Medicare HMO | Attending: Oncology

## 2020-12-20 ENCOUNTER — Other Ambulatory Visit: Payer: Medicare HMO

## 2020-12-20 ENCOUNTER — Inpatient Hospital Stay (HOSPITAL_BASED_OUTPATIENT_CLINIC_OR_DEPARTMENT_OTHER): Payer: Medicare HMO | Admitting: Oncology

## 2020-12-20 ENCOUNTER — Other Ambulatory Visit: Payer: Self-pay | Admitting: *Deleted

## 2020-12-20 ENCOUNTER — Encounter: Payer: Self-pay | Admitting: Oncology

## 2020-12-20 ENCOUNTER — Ambulatory Visit: Payer: Medicare HMO | Admitting: Oncology

## 2020-12-20 ENCOUNTER — Ambulatory Visit: Payer: Medicare HMO

## 2020-12-20 ENCOUNTER — Inpatient Hospital Stay: Payer: Medicare HMO

## 2020-12-20 VITALS — BP 159/69 | HR 69 | Temp 97.9°F | Resp 16 | Wt 162.4 lb

## 2020-12-20 DIAGNOSIS — Z79899 Other long term (current) drug therapy: Secondary | ICD-10-CM | POA: Diagnosis not present

## 2020-12-20 DIAGNOSIS — Z87891 Personal history of nicotine dependence: Secondary | ICD-10-CM | POA: Insufficient documentation

## 2020-12-20 DIAGNOSIS — D509 Iron deficiency anemia, unspecified: Secondary | ICD-10-CM | POA: Diagnosis not present

## 2020-12-20 DIAGNOSIS — D649 Anemia, unspecified: Secondary | ICD-10-CM | POA: Diagnosis not present

## 2020-12-20 DIAGNOSIS — D631 Anemia in chronic kidney disease: Secondary | ICD-10-CM | POA: Insufficient documentation

## 2020-12-20 DIAGNOSIS — I4891 Unspecified atrial fibrillation: Secondary | ICD-10-CM | POA: Diagnosis not present

## 2020-12-20 DIAGNOSIS — Z8546 Personal history of malignant neoplasm of prostate: Secondary | ICD-10-CM | POA: Insufficient documentation

## 2020-12-20 DIAGNOSIS — N189 Chronic kidney disease, unspecified: Secondary | ICD-10-CM

## 2020-12-20 DIAGNOSIS — Z950 Presence of cardiac pacemaker: Secondary | ICD-10-CM | POA: Diagnosis not present

## 2020-12-20 LAB — COMPREHENSIVE METABOLIC PANEL
ALT: 40 U/L (ref 0–44)
AST: 30 U/L (ref 15–41)
Albumin: 3.3 g/dL — ABNORMAL LOW (ref 3.5–5.0)
Alkaline Phosphatase: 63 U/L (ref 38–126)
Anion gap: 8 (ref 5–15)
BUN: 26 mg/dL — ABNORMAL HIGH (ref 8–23)
CO2: 26 mmol/L (ref 22–32)
Calcium: 9 mg/dL (ref 8.9–10.3)
Chloride: 105 mmol/L (ref 98–111)
Creatinine, Ser: 1.83 mg/dL — ABNORMAL HIGH (ref 0.61–1.24)
GFR, Estimated: 34 mL/min — ABNORMAL LOW (ref >60)
Glucose, Bld: 109 mg/dL — ABNORMAL HIGH (ref 70–99)
Potassium: 3.6 mmol/L (ref 3.5–5.1)
Sodium: 139 mmol/L (ref 135–145)
Total Bilirubin: 0.5 mg/dL (ref 0.3–1.2)
Total Protein: 6.6 g/dL (ref 6.5–8.1)

## 2020-12-20 LAB — CBC WITH DIFFERENTIAL/PLATELET
Abs Immature Granulocytes: 0.05 10*3/uL (ref 0.00–0.07)
Basophils Absolute: 0.1 10*3/uL (ref 0.0–0.1)
Basophils Relative: 1 %
Eosinophils Absolute: 0.2 10*3/uL (ref 0.0–0.5)
Eosinophils Relative: 1 %
HCT: 28.4 % — ABNORMAL LOW (ref 39.0–52.0)
Hemoglobin: 9 g/dL — ABNORMAL LOW (ref 13.0–17.0)
Immature Granulocytes: 0 %
Lymphocytes Relative: 7 %
Lymphs Abs: 0.8 10*3/uL (ref 0.7–4.0)
MCH: 27.1 pg (ref 26.0–34.0)
MCHC: 31.7 g/dL (ref 30.0–36.0)
MCV: 85.5 fL (ref 80.0–100.0)
Monocytes Absolute: 0.6 10*3/uL (ref 0.1–1.0)
Monocytes Relative: 5 %
Neutro Abs: 10.2 10*3/uL — ABNORMAL HIGH (ref 1.7–7.7)
Neutrophils Relative %: 86 %
Platelets: 140 10*3/uL — ABNORMAL LOW (ref 150–400)
RBC: 3.32 MIL/uL — ABNORMAL LOW (ref 4.22–5.81)
RDW: 17.3 % — ABNORMAL HIGH (ref 11.5–15.5)
WBC: 11.8 10*3/uL — ABNORMAL HIGH (ref 4.0–10.5)
nRBC: 0 % (ref 0.0–0.2)

## 2020-12-20 MED ORDER — EPOETIN ALFA-EPBX 10000 UNIT/ML IJ SOLN
10000.0000 [IU] | Freq: Once | INTRAMUSCULAR | Status: AC
Start: 2020-12-20 — End: 2020-12-20
  Administered 2020-12-20: 10000 [IU] via SUBCUTANEOUS
  Filled 2020-12-20: qty 1

## 2020-12-20 MED ORDER — EPOETIN ALFA-EPBX 40000 UNIT/ML IJ SOLN
40000.0000 [IU] | Freq: Once | INTRAMUSCULAR | Status: AC
Start: 2020-12-20 — End: 2020-12-20
  Administered 2020-12-20: 40000 [IU] via SUBCUTANEOUS
  Filled 2020-12-20: qty 1

## 2020-12-20 MED ORDER — EPOETIN ALFA-EPBX 20000 UNIT/ML IJ SOLN: 50000.0000 [IU] | INTRAMUSCULAR | Status: DC

## 2020-12-20 NOTE — Progress Notes (Signed)
Hematology/Oncology Consult note [facility]  [phone] [phone]  Patient Care Team: Albina Billet, MD as PCP - General (Internal Medicine) Sindy Guadeloupe, MD as Consulting Physician (Hematology and Oncology)   Name of the patient: Devin Washington  [id number]  11-05-1923   Date of visit: 12/20/20  Diagnosis- anemia likely multifactorial secondary to iron deficiency and chronic kidney disease  Chief complaint/ Reason for visit-routine follow-up of anemia  Heme/Onc history: patient is a 87 year old male with a past medical history significant for atrial fibrillation, CHF, hypertension and prostate cancer who presented to the ER after he suffered a fall. On admission he was found to have H&H of 7.1/22.2 with an MCV of 80.1 and a platelet count of 268.BMP showed an elevated creatinine of 1.9. TSH was normal at 0.52.Patient received 1 unit of PRBC in the ER. Ferritin checked per day later was 152. Iron studies revealed low serum iron of 27, low TIBC of 229 and low iron saturation of 12%. B12 levels were low normal at 264. Folate level was normal at 17.5. Reticulocyte count was low at 2% for the degree of anemia.Coombs test was negative and haptoglobin was normal. LDH was mildly elevated at 228. Myeloma panel showed M protein of IgG kappa 0.5 g. Both kappa and lambda free light chains were elevated with a normal free light chain ratio 0.77. E Po level was 22.3  Patient underwent EGD and colonoscopy on 04/04/2018. EGD showed nonobstructing nonbleeding superficial duodenal ulcers with a clean ulcer base. There are nonbleeding erosions in the gastric antrum andno stigmata of recent bleeding.Duodenum were suspicious for H. pylori.Colonoscopy showed hemorrhoids and 7 mm cecalpolyp  Interval history-patient is at his baseline.  No recent hospitalizations.  He does follow-up with Dr. Lucky Cowboy for Chronic left lower extremity edema  problems peripheral artery disease  ECOG PS- 2 Pain scale- 0   Review of systems- Review of Systems  Constitutional: Positive for malaise/fatigue. Negative for chills, fever and weight loss.  HENT: Negative for congestion, ear discharge and nosebleeds.   Eyes: Negative for blurred vision.  Respiratory: Negative for cough, hemoptysis, sputum production, shortness of breath and wheezing.   Cardiovascular: Negative for chest pain, palpitations, orthopnea and claudication.  Gastrointestinal: Negative for abdominal pain, blood in stool, constipation, diarrhea, heartburn, melena, nausea and vomiting.  Genitourinary: Negative for dysuria, flank pain, frequency, hematuria and urgency.  Musculoskeletal: Negative for back pain, joint pain and myalgias.  Skin: Negative for rash.  Neurological: Negative for dizziness, tingling, focal weakness, seizures, weakness and headaches.  Endo/Heme/Allergies: Does not bruise/bleed easily.  Psychiatric/Behavioral: Negative for depression and suicidal ideas. The patient does not have insomnia.        No Known Allergies   Past Medical History:  Diagnosis Date  . A-fib ([place])   . Anemia   . Cardiomyopathy ([place])   . CHF (congestive heart failure) ([place])   . DJD (degenerative joint disease)   . Duodenal ulcer 08/09/2015  . Dysrhythmia   . Erosive esophagitis   . HH (hiatus hernia)   . HLD (hyperlipidemia)   . Hypertension   . Pacemaker   . PAD (peripheral artery disease) ([place])   . Peripheral vascular disease ([place])   . Presence of permanent cardiac pacemaker   . Prostate cancer [facility])    Prostatectomy.  . 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 Shortness of breath   . SSS (sick sinus syndrome) [facility])      Past Surgical History:  Procedure Laterality  Date  . CATARACT EXTRACTION, BILATERAL    . COLONOSCOPY N/A 04/04/2018   Procedure: COLONOSCOPY;  Surgeon: Lin Landsman, MD;  Location: [facility] ENDOSCOPY;  Service: Gastroenterology;  Laterality: N/A;  .  ESOPHAGOGASTRODUODENOSCOPY N/A 08/09/2015   Procedure: ESOPHAGOGASTRODUODENOSCOPY (EGD) looking in the stomach with a lighted tube to evaluate and treat;  Surgeon: Lollie Sails, MD;  Location: [facility] ENDOSCOPY;  Service: Endoscopy;  Laterality: N/A;  Procedure to be done tomorrow afternoon, 08/08/2015. Awaiting cardiology consult  . ESOPHAGOGASTRODUODENOSCOPY N/A 04/04/2018   Procedure: ESOPHAGOGASTRODUODENOSCOPY (EGD);  Surgeon: Lin Landsman, MD;  Location: [facility] ENDOSCOPY;  Service: Gastroenterology;  Laterality: N/A;  . EYE SURGERY    . FEMORAL-POPLITEAL BYPASS GRAFT Left 12/16/2017   Procedure: BYPASS GRAFT FEMORAL-POPLITEAL ARTERY ( OPEN POPLITEAL BYPASS );  Surgeon: Algernon Huxley, MD;  Location: [facility] ORS;  Service: Vascular;  Laterality: Left;  . FLEXIBLE SIGMOIDOSCOPY N/A 08/09/2015   Procedure: FLEXIBLE SIGMOIDOSCOPY looking up the rectum into the distal colon with a lighted tube to examine and treat;  Surgeon: Lollie Sails, MD;  Location: [facility] ENDOSCOPY;  Service: Endoscopy;  Laterality: N/A;  Procedure to be done tomorrow afternoon, 08/08/2015. Awaiting cardiology consult.  . INSERT / REPLACE / REMOVE PACEMAKER    . JOINT REPLACEMENT Left    left knee replacement  . LOWER EXTREMITY ANGIOGRAPHY Left 11/16/2017   Procedure: LOWER EXTREMITY ANGIOGRAPHY;  Surgeon: Algernon Huxley, MD;  Location: [place] CV LAB;  Service: Cardiovascular;  Laterality: Left;  . LOWER EXTREMITY ANGIOGRAPHY Right 11/30/2017   Procedure: LOWER EXTREMITY ANGIOGRAPHY;  Surgeon: Algernon Huxley, MD;  Location: [place] CV LAB;  Service: Cardiovascular;  Laterality: Right;  . LOWER EXTREMITY ANGIOGRAPHY Left 03/04/2018   Procedure: LOWER EXTREMITY ANGIOGRAPHY;  Surgeon: Algernon Huxley, MD;  Location: [place] CV LAB;  Service: Cardiovascular;  Laterality: Left;  . LOWER EXTREMITY ANGIOGRAPHY Left 04/12/2018   Procedure: LOWER EXTREMITY ANGIOGRAPHY;  Surgeon: Algernon Huxley, MD;  Location: [place] CV  LAB;  Service: Cardiovascular;  Laterality: Left;  . PACEMAKER INSERTION    . PROSTATE SURGERY    . PROSTATE SURGERY      Social History   Socioeconomic History  . Marital status: Widowed    Spouse name: Not on file  . Number of children: Not on file  . Years of education: Not on file  . Highest education level: Not on file  Occupational History    Employer: RETIRED/[employer]  Tobacco Use  . Smoking status: Former Smoker    Quit date: 03/02/1958    Years since quitting: 62.8  . Smokeless tobacco: Former Systems developer    Types: Chew, Snuff  Vaping Use  . Vaping Use: Never used  Substance and Sexual Activity  . Alcohol use: No    Alcohol/week: 0.0 standard drinks  . Drug use: No  . Sexual activity: Not Currently  Other Topics Concern  . Not on file  Social History Narrative  . Not on file   Social Determinants of Health   Financial Resource Strain: Not on file  Food Insecurity: Not on file  Transportation Needs: Not on file  Physical Activity: Not on file  Stress: Not on file  Social Connections: Not on file  Intimate Partner Violence: Not on file    Family History  Problem Relation Age of Onset  . Lung cancer Father      Current Outpatient Medications:  .  acetaminophen (TYLENOL) 325 MG tablet, Take 325 mg by  mouth in the morning and at bedtime., Disp: , Rfl:  .  albuterol (PROVENTIL HFA;VENTOLIN HFA) 108 (90 Base) MCG/ACT inhaler, Inhale 2 puffs into the lungs every 6 (six) hours as needed for wheezing or shortness of breath., Disp: 1 Inhaler, Rfl: 2 .  amLODipine (NORVASC) 10 MG tablet, Take 10 mg by mouth daily. , Disp: , Rfl:  .  ANORO ELLIPTA 62.5-25 MCG/INH AEPB, Inhale 1 puff into the lungs 2 (two) times daily as needed., Disp: , Rfl:  .  aspirin 81 MG EC tablet, Take by mouth., Disp: , Rfl:  .  benazepril (LOTENSIN) 20 MG tablet, Take 1 tablet (20 mg total) by mouth daily., Disp: 30 tablet, Rfl: 0 .  diphenhydrAMINE (BENADRYL) 25 mg capsule, 25 mg  every 6 (six) hours as needed., Disp: , Rfl:  .  feeding supplement, ENSURE ENLIVE, (ENSURE ENLIVE) LIQD, Take 1 Bottle by mouth daily., Disp: , Rfl:  .  ferrous sulfate 325 (65 FE) MG tablet, Take 1 tablet (325 mg total) by mouth 2 (two) times daily with a meal. (Patient taking differently: Take 325 mg by mouth daily with breakfast.), Disp: 60 tablet, Rfl: 3 .  furosemide (LASIX) 20 MG tablet, Take 1 tablet by mouth daily., Disp: , Rfl:  .  metoprolol tartrate (LOPRESSOR) 25 MG tablet, Take 1 tablet (25 mg total) by mouth 2 (two) times daily., Disp: 60 tablet, Rfl: 0 .  simvastatin (ZOCOR) 20 MG tablet, Take 20 mg by mouth daily. , Disp: , Rfl:  .  simvastatin (ZOCOR) 40 MG tablet, Take 40 mg by mouth daily., Disp: , Rfl:  .  traMADol (ULTRAM) 50 MG tablet, Take 1 tablet (50 mg total) by mouth every 6 (six) hours as needed for moderate pain or severe pain., Disp: 30 tablet, Rfl: 0 .  traZODone (DESYREL) 50 MG tablet, Take 1 tablet by mouth at bedtime as needed for sleep. , Disp: , Rfl:  .  vitamin B-12 1000 MCG tablet, Take 1 tablet (1,000 mcg total) by mouth daily., Disp: 60 tablet, Rfl: 1 No current facility-administered medications for this visit.  Facility-Administered Medications Ordered in Other Visits:  .  Darbepoetin Alfa (ARANESP) injection 100 mcg, 100 mcg, Subcutaneous, Q28 days, Sindy Guadeloupe, MD, 100 mcg at 04/16/18 1505  Physical exam:  Vitals:   12/20/20 1259  BP: (!) 159/69  Pulse: 69  Resp: 16  Temp: 97.9 F (36.6 C)  Weight: 162 lb 6.4 oz (73.7 kg)   Physical Exam Constitutional:      Comments: Sitting in a wheelchair.  Appears in no acute distress  Eyes:     Extraocular Movements: EOM normal.  Cardiovascular:     Rate and Rhythm: Normal rate and regular rhythm.     Heart sounds: Normal heart sounds.  Pulmonary:     Effort: Pulmonary effort is normal.     Breath sounds: Normal breath sounds.  Musculoskeletal:     Right lower leg: Edema present.     Left lower  leg: Edema present.  Skin:    General: Skin is warm and dry.  Neurological:     Mental Status: He is alert and oriented to person, place, and time.      CMP Latest Ref Rng & Units 12/20/2020  Glucose 70 - 99 mg/dL 109(H)  BUN 8 - 23 mg/dL 26(H)  Creatinine 0.61 - 1.24 mg/dL 1.83(H)  Sodium 135 - 145 mmol/L 139  Potassium 3.5 - 5.1 mmol/L 3.6  Chloride 98 - 111 mmol/L  105  CO2 22 - 32 mmol/L 26  Calcium 8.9 - 10.3 mg/dL 9.0  Total Protein 6.5 - 8.1 g/dL 6.6  Total Bilirubin 0.3 - 1.2 mg/dL 0.5  Alkaline Phos 38 - 126 U/L 63  AST 15 - 41 U/L 30  ALT 0 - 44 U/L 40   CBC Latest Ref Rng & Units 12/20/2020  WBC 4.0 - 10.5 K/uL 11.8(H)  Hemoglobin 13.0 - 17.0 g/dL 9.0(L)  Hematocrit 39.0 - 52.0 % 28.4(L)  Platelets 150 - 400 K/uL 140(L)      Assessment and plan- Patient is a 87 y.o. male with anemia of chronic kidney disease here for routine follow-up  Hemoglobin is 9 today and patient will receive his Retacrit today.  Patient's hemoglobin had dropped to 7.82 months ago and is gradually improving after starting Retacrit.  Goal is to maintain hemoglobin between 10-11.  Patient will continue to get Retacrit every 3 weeks and I will see him back in 3 months with CBC ferritin and iron studies   Visit Diagnosis 1. Anemia of chronic renal failure, unspecified CKD stage   2. Erythropoietin (EPO) stimulating agent anemia management patient      Dr. Randa Evens, MD, MPH [facility] at [facility] [phone] 12/20/2020 1:39 PM

## 2020-12-20 NOTE — Progress Notes (Signed)
Left leg swollen due to using a vein for cardiac surgery. Has una boot on but family feels that it is getting more swollen. Asked them to call [facility] about this. They have appt this month and will check with them per family

## 2020-12-26 ENCOUNTER — Encounter: Payer: Self-pay | Admitting: Emergency Medicine

## 2020-12-26 ENCOUNTER — Emergency Department
Admission: EM | Admit: 2020-12-26 | Discharge: 2020-12-26 | Disposition: A | Discharge status: Home / Self Care | Payer: Medicare HMO | Attending: Emergency Medicine | Admitting: Emergency Medicine

## 2020-12-26 ENCOUNTER — Emergency Department: Payer: Medicare HMO

## 2020-12-26 ENCOUNTER — Other Ambulatory Visit: Payer: Self-pay

## 2020-12-26 DIAGNOSIS — I509 Heart failure, unspecified: Secondary | ICD-10-CM | POA: Insufficient documentation

## 2020-12-26 DIAGNOSIS — I724 Aneurysm of artery of lower extremity: Secondary | ICD-10-CM

## 2020-12-26 DIAGNOSIS — M7989 Other specified soft tissue disorders: Secondary | ICD-10-CM | POA: Diagnosis present

## 2020-12-26 DIAGNOSIS — Z87891 Personal history of nicotine dependence: Secondary | ICD-10-CM | POA: Diagnosis not present

## 2020-12-26 DIAGNOSIS — Z7982 Long term (current) use of aspirin: Secondary | ICD-10-CM | POA: Diagnosis not present

## 2020-12-26 DIAGNOSIS — N183 Chronic kidney disease, stage 3 unspecified: Secondary | ICD-10-CM | POA: Diagnosis not present

## 2020-12-26 DIAGNOSIS — Z79899 Other long term (current) drug therapy: Secondary | ICD-10-CM | POA: Diagnosis not present

## 2020-12-26 DIAGNOSIS — I130 Hypertensive heart and chronic kidney disease with heart failure and stage 1 through stage 4 chronic kidney disease, or unspecified chronic kidney disease: Secondary | ICD-10-CM | POA: Insufficient documentation

## 2020-12-26 LAB — CBC WITH DIFFERENTIAL/PLATELET
Abs Immature Granulocytes: 0.06 10*3/uL (ref 0.00–0.07)
Basophils Absolute: 0 10*3/uL (ref 0.0–0.1)
Basophils Relative: 0 %
Eosinophils Absolute: 0.2 10*3/uL (ref 0.0–0.5)
Eosinophils Relative: 2 %
HCT: 28.4 % — ABNORMAL LOW (ref 39.0–52.0)
Hemoglobin: 8.7 g/dL — ABNORMAL LOW (ref 13.0–17.0)
Immature Granulocytes: 1 %
Lymphocytes Relative: 8 %
Lymphs Abs: 0.8 10*3/uL (ref 0.7–4.0)
MCH: 26.8 pg (ref 26.0–34.0)
MCHC: 30.6 g/dL (ref 30.0–36.0)
MCV: 87.4 fL (ref 80.0–100.0)
Monocytes Absolute: 0.6 10*3/uL (ref 0.1–1.0)
Monocytes Relative: 5 %
Neutro Abs: 9.4 10*3/uL — ABNORMAL HIGH (ref 1.7–7.7)
Neutrophils Relative %: 84 %
Platelets: 97 10*3/uL — ABNORMAL LOW (ref 150–400)
RBC: 3.25 MIL/uL — ABNORMAL LOW (ref 4.22–5.81)
RDW: 18.7 % — ABNORMAL HIGH (ref 11.5–15.5)
Smear Review: DECREASED
WBC: 11.1 10*3/uL — ABNORMAL HIGH (ref 4.0–10.5)
nRBC: 0 % (ref 0.0–0.2)

## 2020-12-26 LAB — PROTIME-INR
INR: 1.2 (ref 0.8–1.2)
Prothrombin Time: 14.7 seconds (ref 11.4–15.2)

## 2020-12-26 LAB — BASIC METABOLIC PANEL
Anion gap: 10 (ref 5–15)
BUN: 21 mg/dL (ref 8–23)
CO2: 25 mmol/L (ref 22–32)
Calcium: 9 mg/dL (ref 8.9–10.3)
Chloride: 105 mmol/L (ref 98–111)
Creatinine, Ser: 1.89 mg/dL — ABNORMAL HIGH (ref 0.61–1.24)
GFR, Estimated: 32 mL/min — ABNORMAL LOW (ref >60)
Glucose, Bld: 154 mg/dL — ABNORMAL HIGH (ref 70–99)
Potassium: 4 mmol/L (ref 3.5–5.1)
Sodium: 140 mmol/L (ref 135–145)

## 2020-12-26 NOTE — ED Provider Notes (Signed)
[facility] Emergency Department Provider Note ____________________________________________   Event Date/Time   First MD Initiated Contact with Patient 12/26/20 1204     (approximate)  I have reviewed the triage vital signs and the nursing notes.   HISTORY  Chief Complaint Leg Swelling  HPI TAYLOR LEVICK is a 87 y.o. male with history of aneurysm of the left lower extremity presents to the emergency department for treatment and evaluation of increase in left leg swelling and pain. Granddaughter states that it has not ever been this big. No relief with UNNA boot x 2 months. He saw Dr. Clayborn Bigness who recommended that he come to the ER to rule out DVT.Marland Kitchen         Past Medical History:  Diagnosis Date  . A-fib ([place])   . Anemia   . Cardiomyopathy ([place])   . CHF (congestive heart failure) ([place])   . DJD (degenerative joint disease)   . Duodenal ulcer 08/09/2015  . Dysrhythmia   . Erosive esophagitis   . HH (hiatus hernia)   . HLD (hyperlipidemia)   . Hypertension   . Pacemaker   . PAD (peripheral artery disease) ([place])   . Peripheral vascular disease ([place])   . Presence of permanent cardiac pacemaker   . Prostate cancer [facility])    Prostatectomy.  . 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 Shortness of breath   . SSS (sick sinus syndrome) [facility])     Patient Active Problem List   Diagnosis Date Noted  . Swelling of limb 07/03/2020  . Peripheral artery disease ([place]) 01/09/2020  . Benign hypertensive kidney disease with chronic kidney disease 12/21/2019  . Proteinuria 12/21/2019  . Secondary hyperparathyroidism of renal origin ([place]) 12/21/2019  . Anemia in chronic kidney disease 12/21/2019  . Atrial fibrillation ([place]) 08/24/2019  . Bradycardia 08/24/2019  . Cardiomyopathy ([place]) 08/24/2019  . CHF (congestive heart failure) ([place]) 08/24/2019  . Sick sinus syndrome ([place]) 08/24/2019  . SOB (shortness of breath) 08/24/2019  . Age-related osteoporosis with current  pathological fracture with routine healing 10/08/2018  . Wheelchair bound 10/08/2018  . Pubic ramus fracture ([place]) 10/03/2018  . Acute blood loss anemia 05/28/2018  . Iron deficiency anemia 04/09/2018  . Symptomatic anemia 04/02/2018  . 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 Hypertension 11/13/2017  . 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 Protein-calorie malnutrition, severe ([place]) 08/08/2015  . Abdominal pain, epigastric 08/06/2015  . Nausea and vomiting 08/06/2015  . HTN (hypertension) 08/06/2015  . Hypercholesteremia 08/06/2015  . Back pain 08/06/2015  . CKD (chronic kidney disease), stage III ([place]) 08/06/2015  . Abdominal pain 08/06/2015    Past Surgical History:  Procedure Laterality Date  . CATARACT EXTRACTION, BILATERAL    . COLONOSCOPY N/A 04/04/2018   Procedure: COLONOSCOPY;  Surgeon: Lin Landsman, MD;  Location[facility] ENDOSCOPY;  Service: Gastroenterology;  Laterality: N/A;  . ESOPHAGOGASTRODUODENOSCOPY N/A 08/09/2015   Procedure: ESOPHAGOGASTRODUODENOSCOPY (EGD) looking in the stomach with a lighted tube to evaluate and treat;  Surgeon: Lollie Sails, MD;  Location: [facility] ENDOSCOPY;  Service: Endoscopy;  Laterality: N/A;  Procedure to be done tomorrow afternoon, 08/08/2015. Awaiting cardiology consult  . ESOPHAGOGASTRODUODENOSCOPY N/A 04/04/2018   Procedure: ESOPHAGOGASTRODUODENOSCOPY (EGD);  Surgeon: Lin Landsman, MD;  Location: [facility] ENDOSCOPY;  Service: Gastroenterology;  Laterality: N/A;  . EYE SURGERY    . FEMORAL-POPLITEAL BYPASS GRAFT Left 12/16/2017   Procedure: BYPASS GRAFT FEMORAL-POPLITEAL ARTERY ( OPEN POPLITEAL BYPASS );  Surgeon: Algernon Huxley, MD;  Location:  [facility] ORS;  Service: Vascular;  Laterality: Left;  . FLEXIBLE SIGMOIDOSCOPY N/A 08/09/2015   Procedure: FLEXIBLE SIGMOIDOSCOPY looking up the rectum into the distal colon with a lighted tube to examine and treat;  Surgeon: Lollie Sails, MD;  Location: [facility] ENDOSCOPY;   Service: Endoscopy;  Laterality: N/A;  Procedure to be done tomorrow afternoon, 08/08/2015. Awaiting cardiology consult.  . INSERT / REPLACE / REMOVE PACEMAKER    . JOINT REPLACEMENT Left    left knee replacement  . LOWER EXTREMITY ANGIOGRAPHY Left 11/16/2017   Procedure: LOWER EXTREMITY ANGIOGRAPHY;  Surgeon: Algernon Huxley, MD;  Location: [place] CV LAB;  Service: Cardiovascular;  Laterality: Left;  . LOWER EXTREMITY ANGIOGRAPHY Right 11/30/2017   Procedure: LOWER EXTREMITY ANGIOGRAPHY;  Surgeon: Algernon Huxley, MD;  Location: [place] CV LAB;  Service: Cardiovascular;  Laterality: Right;  . LOWER EXTREMITY ANGIOGRAPHY Left 03/04/2018   Procedure: LOWER EXTREMITY ANGIOGRAPHY;  Surgeon: Algernon Huxley, MD;  Location: [place] CV LAB;  Service: Cardiovascular;  Laterality: Left;  . LOWER EXTREMITY ANGIOGRAPHY Left 04/12/2018   Procedure: LOWER EXTREMITY ANGIOGRAPHY;  Surgeon: Algernon Huxley, MD;  Location: [place] CV LAB;  Service: Cardiovascular;  Laterality: Left;  . PACEMAKER INSERTION    . PROSTATE SURGERY    . PROSTATE SURGERY      Prior to Admission medications   Medication Sig Start Date End Date Taking? Authorizing Provider  acetaminophen (TYLENOL) 325 MG tablet Take 325 mg by mouth in the morning and at bedtime. 04/06/18   [provider]  albuterol (PROVENTIL HFA;VENTOLIN HFA) 108 (90 Base) MCG/ACT inhaler Inhale 2 puffs into the lungs every 6 (six) hours as needed for wheezing or shortness of breath. 04/23/17   Lavonia Drafts, MD  amLODipine (NORVASC) 10 MG tablet Take 10 mg by mouth daily.  08/31/18   [provider]  ANORO ELLIPTA 62.5-25 MCG/INH AEPB Inhale 1 puff into the lungs 2 (two) times daily as needed. 04/13/17   [provider]  aspirin 81 MG EC tablet Take by mouth. 12/22/17   [provider]  benazepril (LOTENSIN) 20 MG tablet Take 1 tablet (20 mg total) by mouth daily. 04/06/18   Nicholes Mango, MD  diphenhydrAMINE (BENADRYL) 25  mg capsule 25 mg every 6 (six) hours as needed.    [provider]  feeding supplement, ENSURE ENLIVE, (ENSURE ENLIVE) LIQD Take 1 Bottle by mouth daily. 04/07/18   [provider]  ferrous sulfate 325 (65 FE) MG tablet Take 1 tablet (325 mg total) by mouth 2 (two) times daily with a meal. Patient taking differently: Take 325 mg by mouth daily with breakfast. 04/06/18   Gouru, Illene Silver, MD  furosemide (LASIX) 20 MG tablet Take 1 tablet by mouth daily. 04/23/17   [provider]  metoprolol tartrate (LOPRESSOR) 25 MG tablet Take 1 tablet (25 mg total) by mouth 2 (two) times daily. 04/06/18   Gouru, Illene Silver, MD  simvastatin (ZOCOR) 20 MG tablet Take 20 mg by mouth daily.     [provider]  simvastatin (ZOCOR) 40 MG tablet Take 40 mg by mouth daily. 07/12/20   [provider]  traMADol (ULTRAM) 50 MG tablet Take 1 tablet (50 mg total) by mouth every 6 (six) hours as needed for moderate pain or severe pain. 10/07/18   Nicholes Mango, MD  traZODone (DESYREL) 50 MG tablet Take 1 tablet by mouth at bedtime as needed for sleep.  04/13/17   [provider]  vitamin B-12  1000 MCG tablet Take 1 tablet (1,000 mcg total) by mouth daily. 04/07/18   Nicholes Mango, MD    Allergies Patient has no known allergies.  Family History  Problem Relation Age of Onset  . Lung cancer Father     Social History Social History   Tobacco Use  . Smoking status: Former Smoker    Quit date: 03/02/1958    Years since quitting: 62.8  . Smokeless tobacco: Former Systems developer    Types: Chew, Snuff  Vaping Use  . Vaping Use: Never used  Substance Use Topics  . Alcohol use: No    Alcohol/week: 0.0 standard drinks  . Drug use: No    Review of Systems  Constitutional: No fever/chills Eyes: No visual changes. ENT: No sore throat. Cardiovascular: Denies chest pain. Respiratory: Denies shortness of breath. Gastrointestinal: No abdominal pain.  No nausea, no vomiting.  No diarrhea.  No  constipation. Genitourinary: Negative for dysuria. Musculoskeletal: Negative for back pain Positive for left calf swelling. Skin: Negative for rash. Neurological: Negative for headaches, focal weakness or numbness  ____________________________________________   PHYSICAL EXAM:  VITAL SIGNS: ED Triage Vitals  Enc Vitals Group     BP 12/26/20 1116 (!) 161/63     Pulse Rate 12/26/20 1116 66     Resp --      Temp 12/26/20 1116 98.5 F (36.9 C)     Temp Source 12/26/20 1116 Oral     SpO2 12/26/20 1116 99 %     Weight 12/26/20 1051 162 lb 7.7 oz (73.7 kg)     Height 12/26/20 1051 5\' 10"  (1.778 m)     Head Circumference --      Peak Flow --      Pain Score --      Pain Loc --      Pain Edu? --      Excl. in [place]? --     Constitutional: Alert and oriented. Well appearing and in no acute distress. Eyes: Conjunctivae are normal. PERRL. EOMI. Head: Atraumatic. Nose: No congestion/rhinnorhea. Mouth/Throat: Mucous membranes are moist.  Oropharynx non-erythematous. Neck: No stridor.   Hematological/Lymphatic/Immunilogical: No cervical lymphadenopathy. Cardiovascular: Normal rate, regular rhythm. Grossly normal heart sounds.  Good peripheral circulation. Respiratory: Normal respiratory effort.  No retractions. Lungs CTAB. Gastrointestinal: Soft and nontender. No distention. No abdominal bruits. No CVA tenderness. Genitourinary:  Musculoskeletal: No lower extremity tenderness nor edema.  No joint effusions. Neurologic:  Normal speech and language. No gross focal neurologic deficits are appreciated. No gait instability. Skin:  Skin is warm, dry and intact. No rash noted. Psychiatric: Mood and affect are normal. Speech and behavior are normal.  ____________________________________________   LABS (all labs ordered are listed, but only abnormal results are displayed)  Labs Reviewed  BASIC METABOLIC PANEL - Abnormal; Notable for the following components:      Result Value   Glucose,  Bld 154 (*)    Creatinine, Ser 1.89 (*)    GFR, Estimated 32 (*)    All other components within normal limits  CBC WITH DIFFERENTIAL/PLATELET - Abnormal; Notable for the following components:   WBC 11.1 (*)    RBC 3.25 (*)    Hemoglobin 8.7 (*)    HCT 28.4 (*)    RDW 18.7 (*)    Platelets 97 (*)    Neutro Abs 9.4 (*)    All other components within normal limits  PROTIME-INR   ____________________________________________  EKG  Not indicated. ____________________________________________  RADIOLOGY  ED MD interpretation:  US shows aneurysmal dilatation of the left common femoral artery. 7.4cm left popliteal artery aneurysm predominantly thrombosed.  I, Sherrie George, personally viewed and evaluated these images (plain radiographs) as part of my medical decision making, as well as reviewing the written report by the radiologist.  Official radiology report(s): US Venous Img Lower Unilateral Left  Result Date: 12/26/2020 CLINICAL DATA:  Left lower extremity pain and edema. History prostate cancer. History of left femoral popliteal bypass grafting and left popliteal artery aneurysm. EXAM: LEFT LOWER EXTREMITY VENOUS DOPPLER ULTRASOUND TECHNIQUE: Gray-scale sonography with graded compression, as well as color Doppler and duplex ultrasound were performed to evaluate the lower extremity deep venous systems from the level of the common femoral vein and including the common femoral, femoral, profunda femoral, popliteal and calf veins including the posterior tibial, peroneal and gastrocnemius veins when visible. The superficial great saphenous vein was also interrogated. Spectral Doppler was utilized to evaluate flow at rest and with distal augmentation maneuvers in the common femoral, femoral and popliteal veins. COMPARISON:  None. FINDINGS: Contralateral Common Femoral Vein: Respiratory phasicity is normal and symmetric with the symptomatic side. No evidence of thrombus. Normal  compressibility. Common Femoral Vein: No evidence of thrombus. Normal compressibility, respiratory phasicity and response to augmentation. Saphenofemoral Junction: No evidence of thrombus. Normal compressibility and flow on color Doppler imaging. Profunda Femoral Vein: No evidence of thrombus. Normal compressibility and flow on color Doppler imaging. Femoral Vein: No evidence of thrombus. Normal compressibility, respiratory phasicity and response to augmentation. Popliteal Vein: No evidence of thrombus. Normal compressibility, respiratory phasicity and response to augmentation. Calf Veins: No evidence of thrombus. Normal compressibility and flow on color Doppler imaging. Superficial Great Saphenous Vein: No evidence of thrombus. Normal compressibility. Venous Reflux:  None. Other Findings: The left common femoral artery appears aneurysmal measuring at least 2.7 cm in diameter (image 15), with large amount of crescentic mural thrombus. Note is also made of a large at least 7.4 x 6.7 cm predominantly thrombosed aneurysm involving the left popliteal artery (representative images 30 and 39) IMPRESSION: 1. No evidence of DVT within the left lower extremity. 2. Aneurysmal dilatation of the left common femoral artery measuring at least 2.7 cm diameter with large amount of crescentic mural thrombus. Large (at least 7.4 cm) left popliteal artery aneurysm which appears predominantly thrombosed. By report, these findings are clinically known however correlation for symptoms of PAD is advised. Further evaluation with CTA run-off could be as indicated. Electronically Signed   By: Sandi Mariscal M.D.   On: 12/26/2020 13:23    ____________________________________________   PROCEDURES  Procedure(s) performed (including Critical Care):  Procedures  ____________________________________________   INITIAL IMPRESSION / ASSESSMENT AND PLAN     87 year old male presents to the ER for treatment and evaluation of left leg  swelling. See HPI. [place] to rule out DVT ordered. Leg and foot are normal in color and without wound. Palpable DP pulse.  DIFFERENTIAL DIAGNOSIS  Ruptured popliteal aneurysm, DVT, ischemic limb  ED COURSE  Clinical Course as of 12/26/20 1605  Wed Dec 26, 2020  1347 [place] results reviewed. Will get labs and CT with runoff as recommended by radiology if renal function allows. Will also call Dr. Lucky Cowboy to discuss [place] results. Patient and Granddaughter advised of results and plan [CT]    Clinical Course User Index [CT] Victorino Dike, FNP   Dr. Lucky Cowboy advises that today's ultrasound results are a chronic finding. He does not feel that CT is indicated at this  time. Granddaughter is upset because she feels that the leg is larger today than usual. Dr. Lucky Cowboy recommended Louretta Parma boot, which was applied by RN. Case discussed with Dr. Joni Fears, ED attending, and plan will be to discharge him with close follow up with PCP, vascular, and cardiology.  ___________________________________________   FINAL CLINICAL IMPRESSION(S) / ED DIAGNOSES  Final diagnoses:  Aneurysm of left popliteal artery [facility])     ED Discharge Orders    None       ANDERSON MIDDLEBROOKS was evaluated in Emergency Department on 12/26/2020 for the symptoms described in the history of present illness. He was evaluated in the context of the global COVID-19 pandemic, which necessitated consideration that the patient might be at risk for infection with the SARS-CoV-2 virus that causes COVID-19. Institutional protocols and algorithms that pertain to the evaluation of patients at risk for COVID-19 are in a state of rapid change based on information released by regulatory bodies including the CDC and federal and state organizations. These policies and algorithms were followed during the patient's care in the ED.   Note:  This document was prepared using Dragon voice recognition software and may include unintentional dictation errors.   Victorino Dike,  FNP 12/26/20 1605    Carrie Mew, MD 12/27/20 2257

## 2020-12-26 NOTE — Discharge Instructions (Addendum)
Please call PCP/Dr. Clayborn Bigness to schedule a follow up appointment.  Return to the ER if the leg gets worse, appears pale, or pain becomes severe.

## 2020-12-26 NOTE — ED Triage Notes (Signed)
C/O left leg pain and swelling.  Referred to ED by Dr. Marcelline Deist for [place] to r/o DVT  Patient awake and alert. NAD

## 2021-01-01 ENCOUNTER — Encounter (INDEPENDENT_AMBULATORY_CARE_PROVIDER_SITE_OTHER): Payer: Medicare HMO

## 2021-01-01 ENCOUNTER — Ambulatory Visit (INDEPENDENT_AMBULATORY_CARE_PROVIDER_SITE_OTHER): Payer: Medicare HMO | Admitting: Vascular Surgery

## 2021-01-02 ENCOUNTER — Ambulatory Visit (INDEPENDENT_AMBULATORY_CARE_PROVIDER_SITE_OTHER): Payer: Medicare HMO | Admitting: Nurse Practitioner

## 2021-01-02 ENCOUNTER — Other Ambulatory Visit: Payer: Self-pay

## 2021-01-02 ENCOUNTER — Ambulatory Visit (INDEPENDENT_AMBULATORY_CARE_PROVIDER_SITE_OTHER): Payer: Medicare HMO

## 2021-01-02 VITALS — BP 179/79 | HR 75 | Resp 16 | Wt 161.0 lb

## 2021-01-02 DIAGNOSIS — I723 Aneurysm of iliac artery: Secondary | ICD-10-CM | POA: Diagnosis not present

## 2021-01-02 DIAGNOSIS — I724 Aneurysm of artery of lower extremity: Secondary | ICD-10-CM

## 2021-01-02 DIAGNOSIS — I890 Lymphedema, not elsewhere classified: Secondary | ICD-10-CM

## 2021-01-02 DIAGNOSIS — I10 Essential (primary) hypertension: Secondary | ICD-10-CM | POA: Diagnosis not present

## 2021-01-02 DIAGNOSIS — I714 Abdominal aortic aneurysm, without rupture, unspecified: Secondary | ICD-10-CM

## 2021-01-08 ENCOUNTER — Ambulatory Visit: Payer: Medicare HMO

## 2021-01-08 ENCOUNTER — Other Ambulatory Visit: Payer: Medicare HMO

## 2021-01-08 ENCOUNTER — Ambulatory Visit: Payer: Medicare HMO | Admitting: Oncology

## 2021-01-09 ENCOUNTER — Other Ambulatory Visit: Payer: Self-pay | Admitting: *Deleted

## 2021-01-09 DIAGNOSIS — D631 Anemia in chronic kidney disease: Secondary | ICD-10-CM

## 2021-01-09 DIAGNOSIS — N189 Chronic kidney disease, unspecified: Secondary | ICD-10-CM

## 2021-01-10 ENCOUNTER — Other Ambulatory Visit: Payer: Self-pay

## 2021-01-10 ENCOUNTER — Inpatient Hospital Stay: Payer: Medicare HMO

## 2021-01-10 ENCOUNTER — Other Ambulatory Visit: Payer: Self-pay | Admitting: Oncology

## 2021-01-10 VITALS — BP 141/64 | HR 69

## 2021-01-10 DIAGNOSIS — N189 Chronic kidney disease, unspecified: Secondary | ICD-10-CM | POA: Diagnosis not present

## 2021-01-10 DIAGNOSIS — D631 Anemia in chronic kidney disease: Secondary | ICD-10-CM

## 2021-01-10 DIAGNOSIS — D509 Iron deficiency anemia, unspecified: Secondary | ICD-10-CM

## 2021-01-10 LAB — HEMOGLOBIN AND HEMATOCRIT, BLOOD
HCT: 24.5 % — ABNORMAL LOW (ref 39.0–52.0)
Hemoglobin: 7.8 g/dL — ABNORMAL LOW (ref 13.0–17.0)

## 2021-01-10 MED ORDER — EPOETIN ALFA-EPBX 20000 UNIT/ML IJ SOLN: 50000.0000 [IU] | INTRAMUSCULAR | Status: DC

## 2021-01-10 MED ORDER — EPOETIN ALFA-EPBX 10000 UNIT/ML IJ SOLN
10000.0000 [IU] | Freq: Once | INTRAMUSCULAR | Status: AC
  Administered 2021-01-10: 10000 [IU] via SUBCUTANEOUS
  Filled 2021-01-10: qty 1

## 2021-01-10 MED ORDER — EPOETIN ALFA-EPBX 40000 UNIT/ML IJ SOLN
40000.0000 [IU] | Freq: Once | INTRAMUSCULAR | Status: AC
  Administered 2021-01-10: 40000 [IU] via SUBCUTANEOUS
  Filled 2021-01-10: qty 1

## 2021-01-13 ENCOUNTER — Encounter (INDEPENDENT_AMBULATORY_CARE_PROVIDER_SITE_OTHER): Payer: Self-pay | Admitting: Nurse Practitioner

## 2021-01-13 NOTE — Progress Notes (Signed)
Subjective:    Patient ID: Devin Washington, male    DOB: 02/26/1923, 87 y.o.   MRN: [id number] Chief Complaint  Patient presents with  . Follow-up    6 month ultrasound follow up    Patient returns today for evaluation of lower extremity edema, abdominal aortic aneurysm, iliac artery aneurysms and popliteal aneurysms.  Overall patient has been doing well.  He continues to be in [employer] but he does note that these are beginning to become uncomfortable for him.  Previous ulcerations and blisters have healed.  Today the swelling is under good control.  The patient does try to elevate his lower extremities much as possible per granddaughter sometimes there is forgetfulness regards to this.  He denies any fever, chills, nausea, vomiting or diarrhea.  He denies any abdominal pain.  Today noninvasive studies shows an abdominal aortic aneurysm of 3.4 cm, this is slightly increased from previous studies of 3.1 cm on 01/04/2020.  Largest iliac artery aneurysm is 1.5 cm.  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  The patient has biphasic/monophasic waveforms bilaterally.  Bypass graft is open and patent.    Review of Systems  Cardiovascular: Positive for leg swelling.  All other systems reviewed and are negative.      Objective:   Physical Exam Vitals reviewed.  HENT:     Head: Normocephalic.  Cardiovascular:     Rate and Rhythm: Normal rate.     Pulses: Decreased pulses.  Pulmonary:     Effort: Pulmonary effort is normal.  Musculoskeletal:     Right lower leg: Edema present.     Left lower leg: Edema present.  Neurological:     Mental Status: He is alert and oriented to person, place, and time.     Motor: Weakness present.     Gait: Gait abnormal.  Psychiatric:        Mood and Affect: Mood normal.        Behavior: Behavior normal.        Thought Content: Thought content normal.        Judgment: Judgment normal.     BP (!) 179/79 (BP  Location: Right Arm)   Pulse 75   Resp 16   Wt 161 lb (73 kg)   BMI 23.10 kg/m   Past Medical History:  Diagnosis Date  . A-fib ([place])   . Anemia   . Cardiomyopathy ([place])   . CHF (congestive heart failure) ([place])   . DJD (degenerative joint disease)   . Duodenal ulcer 08/09/2015  . Dysrhythmia   . Erosive esophagitis   . HH (hiatus hernia)   . HLD (hyperlipidemia)   . Hypertension   . Pacemaker   . PAD (peripheral artery disease) ([place])   . Peripheral vascular disease ([place])   . Presence of permanent cardiac pacemaker   . Prostate cancer [facility])    Prostatectomy.  . 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 Shortness of breath   . SSS (sick sinus syndrome) ([facility])     Social History   Socioeconomic History  . Marital status: Widowed    Spouse name: Not on file  . Number of children: Not on file  . Years of education: Not on file  . Highest education level: Not on file  Occupational History    Employer: RETIRED/[employer]  Tobacco Use  . Smoking status: Former Smoker    Quit date: 03/02/1958    Years since quitting: 62.9  . Smokeless  tobacco: Former Systems developer    Types: Chew, Snuff  Vaping Use  . Vaping Use: Never used  Substance and Sexual Activity  . Alcohol use: No    Alcohol/week: 0.0 standard drinks  . Drug use: No  . Sexual activity: Not Currently  Other Topics Concern  . Not on file  Social History Narrative  . Not on file   Social Determinants of Health   Financial Resource Strain: Not on file  Food Insecurity: Not on file  Transportation Needs: Not on file  Physical Activity: Not on file  Stress: Not on file  Social Connections: Not on file  Intimate Partner Violence: Not on file    Past Surgical History:  Procedure Laterality Date  . CATARACT EXTRACTION, BILATERAL    . COLONOSCOPY N/A 04/04/2018   Procedure: COLONOSCOPY;  Surgeon: Lin Landsman, MD;  Location: [facility] ENDOSCOPY;  Service: Gastroenterology;  Laterality: N/A;  .  ESOPHAGOGASTRODUODENOSCOPY N/A 08/09/2015   Procedure: ESOPHAGOGASTRODUODENOSCOPY (EGD) looking in the stomach with a lighted tube to evaluate and treat;  Surgeon: Lollie Sails, MD;  Location: [facility] ENDOSCOPY;  Service: Endoscopy;  Laterality: N/A;  Procedure to be done tomorrow afternoon, 08/08/2015. Awaiting cardiology consult  . ESOPHAGOGASTRODUODENOSCOPY N/A 04/04/2018   Procedure: ESOPHAGOGASTRODUODENOSCOPY (EGD);  Surgeon: Lin Landsman, MD;  Location: [facility] ENDOSCOPY;  Service: Gastroenterology;  Laterality: N/A;  . EYE SURGERY    . FEMORAL-POPLITEAL BYPASS GRAFT Left 12/16/2017   Procedure: BYPASS GRAFT FEMORAL-POPLITEAL ARTERY ( OPEN POPLITEAL BYPASS );  Surgeon: Algernon Huxley, MD;  Location: [facility] ORS;  Service: Vascular;  Laterality: Left;  . FLEXIBLE SIGMOIDOSCOPY N/A 08/09/2015   Procedure: FLEXIBLE SIGMOIDOSCOPY looking up the rectum into the distal colon with a lighted tube to examine and treat;  Surgeon: Lollie Sails, MD;  Location: [facility] ENDOSCOPY;  Service: Endoscopy;  Laterality: N/A;  Procedure to be done tomorrow afternoon, 08/08/2015. Awaiting cardiology consult.  . INSERT / REPLACE / REMOVE PACEMAKER    . JOINT REPLACEMENT Left    left knee replacement  . LOWER EXTREMITY ANGIOGRAPHY Left 11/16/2017   Procedure: LOWER EXTREMITY ANGIOGRAPHY;  Surgeon: Algernon Huxley, MD;  Location: [place] CV LAB;  Service: Cardiovascular;  Laterality: Left;  . LOWER EXTREMITY ANGIOGRAPHY Right 11/30/2017   Procedure: LOWER EXTREMITY ANGIOGRAPHY;  Surgeon: Algernon Huxley, MD;  Location: [place] CV LAB;  Service: Cardiovascular;  Laterality: Right;  . LOWER EXTREMITY ANGIOGRAPHY Left 03/04/2018   Procedure: LOWER EXTREMITY ANGIOGRAPHY;  Surgeon: Algernon Huxley, MD;  Location: [place] [facility] LAB;  Service: Cardiovascular;  Laterality: Left;  . LOWER EXTREMITY ANGIOGRAPHY Left 04/12/2018   Procedure: LOWER EXTREMITY ANGIOGRAPHY;  Surgeon: Algernon Huxley, MD;  Location: [place] [facility]  LAB;  Service: Cardiovascular;  Laterality: Left;  . PACEMAKER INSERTION    . PROSTATE SURGERY    . PROSTATE SURGERY      Family History  Problem Relation Age of Onset  . Lung cancer Father     No Known Allergies  CBC Latest Ref Rng & Units 01/10/2021 12/26/2020 12/20/2020  WBC 4.0 - 10.5 K/uL - 11.1(H) 11.8(H)  Hemoglobin 13.0 - 17.0 g/dL 7.8(L) 8.7(L) 9.0(L)  Hematocrit 39.0 - 52.0 % 24.5(L) 28.4(L) 28.4(L)  Platelets 150 - 400 K/uL - 97(L) 140(L)      CMP     Component Value Date/Time   NA 140 12/26/2020 1339   K 4.0 12/26/2020 1339   CL 105 12/26/2020 1339   CO2 25 12/26/2020 1339   GLUCOSE  154 (H) 12/26/2020 1339   BUN 21 12/26/2020 1339   CREATININE 1.89 (H) 12/26/2020 1339   CALCIUM 9.0 12/26/2020 1339   PROT 6.6 12/20/2020 1132   ALBUMIN 3.3 (L) 12/20/2020 1132   AST 30 12/20/2020 1132   ALT 40 12/20/2020 1132   ALKPHOS 63 12/20/2020 1132   BILITOT 0.5 12/20/2020 1132   GFRNONAA 32 (L) 12/26/2020 1339   GFRAA 30 (L) 09/13/2020 1038     No results found.     Assessment & Plan:   1. Lymphedema Patient was previously in [place] wraps but these are becoming uncomfortable for him.  Patient is advised to try to utilize medical grade 1 compression stockings as well as to elevate his lower extremities as much as possible.  We will have the patient return in 3 months for evaluation sooner if necessary.  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  This causes an increased size in his left calf.  Noninvasive studies today show however stents are open and patent from treatment of popliteal artery aneurysms.  3. Iliac artery aneurysm (HCC) Iliac artery aneurysms is stable from previous study.  We will continue to monitor on an annual basis with his abdominal aortic aneurysm.  4. Primary hypertension Continue antihypertensive medications as already ordered, these medications have  been reviewed and there are no changes at this time.   5. Abdominal aortic aneurysm (AAA) without rupture (HCC) No surgery or intervention at this time. The patient has an asymptomatic abdominal aortic aneurysm that is less than 4 cm in maximal diameter.  I have discussed the natural history of abdominal aortic aneurysm and the small risk of rupture for aneurysm less than 5 cm in size.  However, as these small aneurysms tend to enlarge over time, continued surveillance with ultrasound or CT scan is mandatory.  I have also discussed optimizing medical management with hypertension and lipid control and the importance of abstinence from tobacco.  The patient is also encouraged to exercise a minimum of 30 minutes 4 times a week.  Should the patient develop new onset abdominal or back pain or signs of peripheral embolization they are instructed to seek medical attention immediately and to alert the physician providing care that they have an aneurysm.  The patient voices their understanding. The patient will return in 12 months with an aortic duplex.    Current Outpatient Medications on File Prior to Visit  Medication Sig Dispense Refill  . acetaminophen (TYLENOL) 325 MG tablet Take 325 mg by mouth in the morning and at bedtime.    Marland Kitchen albuterol (PROVENTIL HFA;VENTOLIN HFA) 108 (90 Base) MCG/ACT inhaler Inhale 2 puffs into the lungs every 6 (six) hours as needed for wheezing or shortness of breath. 1 Inhaler 2  . amLODipine (NORVASC) 10 MG tablet Take 10 mg by mouth daily.     Jearl Klinefelter ELLIPTA 62.5-25 MCG/INH AEPB Inhale 1 puff into the lungs 2 (two) times daily as needed.    Marland Kitchen aspirin 81 MG EC tablet Take by mouth.    . benazepril (LOTENSIN) 20 MG tablet Take 1 tablet (20 mg total) by mouth daily. 30 tablet 0  . diphenhydrAMINE (BENADRYL) 25 mg capsule 25 mg every 6 (six) hours as needed.    . feeding supplement, ENSURE ENLIVE, (ENSURE ENLIVE) LIQD Take 1 Bottle by mouth daily.    . ferrous sulfate  325 (65 FE) MG tablet Take 1 tablet (325 mg total)  by mouth 2 (two) times daily with a meal. (Patient taking differently: Take 325 mg by mouth daily with breakfast.) 60 tablet 3  . furosemide (LASIX) 20 MG tablet Take 1 tablet by mouth daily.    . metoprolol tartrate (LOPRESSOR) 25 MG tablet Take 1 tablet (25 mg total) by mouth 2 (two) times daily. 60 tablet 0  . simvastatin (ZOCOR) 20 MG tablet Take 20 mg by mouth daily.     . simvastatin (ZOCOR) 40 MG tablet Take 40 mg by mouth daily.    . traMADol (ULTRAM) 50 MG tablet Take 1 tablet (50 mg total) by mouth every 6 (six) hours as needed for moderate pain or severe pain. 30 tablet 0  . traZODone (DESYREL) 50 MG tablet Take 1 tablet by mouth at bedtime as needed for sleep.     . vitamin B-12 1000 MCG tablet Take 1 tablet (1,000 mcg total) by mouth daily. 60 tablet 1   Current Facility-Administered Medications on File Prior to Visit  Medication Dose Route Frequency Provider Last Rate Last Admin  . Darbepoetin Alfa (ARANESP) injection 100 mcg  100 mcg Subcutaneous Q28 days Sindy Guadeloupe, MD   100 mcg at 04/16/18 1505    There are no Patient Instructions on file for this visit. No follow-ups on file.   Kris Hartmann, NP

## 2021-01-21 ENCOUNTER — Inpatient Hospital Stay: Payer: Medicare HMO

## 2021-01-21 ENCOUNTER — Other Ambulatory Visit: Payer: Self-pay

## 2021-01-21 ENCOUNTER — Emergency Department: Payer: Medicare HMO

## 2021-01-21 ENCOUNTER — Inpatient Hospital Stay
Admission: EM | Admit: 2021-01-21 | Discharge: 2021-02-05 | DRG: 871 | Disposition: A | Discharge status: Skilled Nursing Facility | Payer: Medicare HMO | Attending: Family Medicine | Admitting: Family Medicine

## 2021-01-21 DIAGNOSIS — D649 Anemia, unspecified: Secondary | ICD-10-CM | POA: Diagnosis not present

## 2021-01-21 DIAGNOSIS — E785 Hyperlipidemia, unspecified: Secondary | ICD-10-CM | POA: Diagnosis present

## 2021-01-21 DIAGNOSIS — R600 Localized edema: Secondary | ICD-10-CM

## 2021-01-21 DIAGNOSIS — E872 Acidosis, unspecified: Secondary | ICD-10-CM

## 2021-01-21 DIAGNOSIS — G9341 Metabolic encephalopathy: Secondary | ICD-10-CM | POA: Diagnosis present

## 2021-01-21 DIAGNOSIS — R680 Hypothermia, not associated with low environmental temperature: Secondary | ICD-10-CM | POA: Diagnosis present

## 2021-01-21 DIAGNOSIS — I10 Essential (primary) hypertension: Secondary | ICD-10-CM | POA: Diagnosis present

## 2021-01-21 DIAGNOSIS — I509 Heart failure, unspecified: Secondary | ICD-10-CM

## 2021-01-21 DIAGNOSIS — Z7982 Long term (current) use of aspirin: Secondary | ICD-10-CM

## 2021-01-21 DIAGNOSIS — I495 Sick sinus syndrome: Secondary | ICD-10-CM | POA: Diagnosis present

## 2021-01-21 DIAGNOSIS — M79605 Pain in left leg: Secondary | ICD-10-CM

## 2021-01-21 DIAGNOSIS — D62 Acute posthemorrhagic anemia: Secondary | ICD-10-CM | POA: Diagnosis not present

## 2021-01-21 DIAGNOSIS — D631 Anemia in chronic kidney disease: Secondary | ICD-10-CM | POA: Diagnosis present

## 2021-01-21 DIAGNOSIS — Z950 Presence of cardiac pacemaker: Secondary | ICD-10-CM

## 2021-01-21 DIAGNOSIS — K2951 Unspecified chronic gastritis with bleeding: Secondary | ICD-10-CM | POA: Diagnosis present

## 2021-01-21 DIAGNOSIS — Z66 Do not resuscitate: Secondary | ICD-10-CM | POA: Diagnosis present

## 2021-01-21 DIAGNOSIS — K449 Diaphragmatic hernia without obstruction or gangrene: Secondary | ICD-10-CM | POA: Diagnosis present

## 2021-01-21 DIAGNOSIS — I959 Hypotension, unspecified: Secondary | ICD-10-CM | POA: Diagnosis present

## 2021-01-21 DIAGNOSIS — Z8719 Personal history of other diseases of the digestive system: Secondary | ICD-10-CM

## 2021-01-21 DIAGNOSIS — K2210 Ulcer of esophagus without bleeding: Secondary | ICD-10-CM | POA: Diagnosis present

## 2021-01-21 DIAGNOSIS — N1832 Chronic kidney disease, stage 3b: Secondary | ICD-10-CM | POA: Diagnosis present

## 2021-01-21 DIAGNOSIS — K319 Disease of stomach and duodenum, unspecified: Secondary | ICD-10-CM | POA: Diagnosis present

## 2021-01-21 DIAGNOSIS — N179 Acute kidney failure, unspecified: Secondary | ICD-10-CM

## 2021-01-21 DIAGNOSIS — Z87891 Personal history of nicotine dependence: Secondary | ICD-10-CM

## 2021-01-21 DIAGNOSIS — I739 Peripheral vascular disease, unspecified: Secondary | ICD-10-CM | POA: Diagnosis present

## 2021-01-21 DIAGNOSIS — N183 Chronic kidney disease, stage 3 unspecified: Secondary | ICD-10-CM | POA: Diagnosis present

## 2021-01-21 DIAGNOSIS — I723 Aneurysm of iliac artery: Secondary | ICD-10-CM | POA: Diagnosis present

## 2021-01-21 DIAGNOSIS — K635 Polyp of colon: Secondary | ICD-10-CM | POA: Diagnosis present

## 2021-01-21 DIAGNOSIS — E7800 Pure hypercholesterolemia, unspecified: Secondary | ICD-10-CM | POA: Diagnosis present

## 2021-01-21 DIAGNOSIS — K529 Noninfective gastroenteritis and colitis, unspecified: Secondary | ICD-10-CM | POA: Diagnosis present

## 2021-01-21 DIAGNOSIS — Z20822 Contact with and (suspected) exposure to covid-19: Secondary | ICD-10-CM | POA: Diagnosis present

## 2021-01-21 DIAGNOSIS — I714 Abdominal aortic aneurysm, without rupture: Secondary | ICD-10-CM | POA: Diagnosis present

## 2021-01-21 DIAGNOSIS — R6520 Severe sepsis without septic shock: Secondary | ICD-10-CM | POA: Diagnosis not present

## 2021-01-21 DIAGNOSIS — I130 Hypertensive heart and chronic kidney disease with heart failure and stage 1 through stage 4 chronic kidney disease, or unspecified chronic kidney disease: Secondary | ICD-10-CM | POA: Diagnosis present

## 2021-01-21 DIAGNOSIS — I480 Paroxysmal atrial fibrillation: Secondary | ICD-10-CM | POA: Diagnosis present

## 2021-01-21 DIAGNOSIS — Z79899 Other long term (current) drug therapy: Secondary | ICD-10-CM

## 2021-01-21 DIAGNOSIS — I5022 Chronic systolic (congestive) heart failure: Secondary | ICD-10-CM | POA: Diagnosis present

## 2021-01-21 DIAGNOSIS — N189 Chronic kidney disease, unspecified: Secondary | ICD-10-CM | POA: Diagnosis present

## 2021-01-21 DIAGNOSIS — J449 Chronic obstructive pulmonary disease, unspecified: Secondary | ICD-10-CM | POA: Diagnosis present

## 2021-01-21 DIAGNOSIS — N170 Acute kidney failure with tubular necrosis: Secondary | ICD-10-CM | POA: Diagnosis present

## 2021-01-21 DIAGNOSIS — W1830XA Fall on same level, unspecified, initial encounter: Secondary | ICD-10-CM | POA: Diagnosis present

## 2021-01-21 DIAGNOSIS — Y92009 Unspecified place in unspecified non-institutional (private) residence as the place of occurrence of the external cause: Secondary | ICD-10-CM

## 2021-01-21 DIAGNOSIS — A419 Sepsis, unspecified organism: Secondary | ICD-10-CM | POA: Diagnosis not present

## 2021-01-21 DIAGNOSIS — Z801 Family history of malignant neoplasm of trachea, bronchus and lung: Secondary | ICD-10-CM

## 2021-01-21 DIAGNOSIS — R0902 Hypoxemia: Secondary | ICD-10-CM | POA: Diagnosis present

## 2021-01-21 DIAGNOSIS — Z8546 Personal history of malignant neoplasm of prostate: Secondary | ICD-10-CM

## 2021-01-21 DIAGNOSIS — Z96652 Presence of left artificial knee joint: Secondary | ICD-10-CM | POA: Diagnosis present

## 2021-01-21 DIAGNOSIS — I724 Aneurysm of artery of lower extremity: Secondary | ICD-10-CM | POA: Diagnosis present

## 2021-01-21 HISTORY — DX: Unspecified asthma, uncomplicated: J45.909

## 2021-01-21 LAB — LACTIC ACID, PLASMA
Lactic Acid, Venous: >11 mmol/L (ref 0.5–1.9)
Lactic Acid, Venous: >11 mmol/L (ref 0.5–1.9)
Lactic Acid, Venous: 3.2 mmol/L (ref 0.5–1.9)

## 2021-01-21 LAB — CBC WITH DIFFERENTIAL/PLATELET
Abs Immature Granulocytes: 0.31 10*3/uL — ABNORMAL HIGH (ref 0.00–0.07)
Basophils Absolute: 0 10*3/uL (ref 0.0–0.1)
Basophils Relative: 0 %
Eosinophils Absolute: 0 10*3/uL (ref 0.0–0.5)
Eosinophils Relative: 0 %
HCT: 20.7 % — ABNORMAL LOW (ref 39.0–52.0)
Hemoglobin: 6 g/dL — ABNORMAL LOW (ref 13.0–17.0)
Immature Granulocytes: 2 %
Lymphocytes Relative: 7 %
Lymphs Abs: 1.1 10*3/uL (ref 0.7–4.0)
MCH: 27 pg (ref 26.0–34.0)
MCHC: 29 g/dL — ABNORMAL LOW (ref 30.0–36.0)
MCV: 93.2 fL (ref 80.0–100.0)
Monocytes Absolute: 0.5 10*3/uL (ref 0.1–1.0)
Monocytes Relative: 3 %
Neutro Abs: 13.6 10*3/uL — ABNORMAL HIGH (ref 1.7–7.7)
Neutrophils Relative %: 88 %
Platelets: 89 10*3/uL — ABNORMAL LOW (ref 150–400)
RBC: 2.22 MIL/uL — ABNORMAL LOW (ref 4.22–5.81)
RDW: 19.3 % — ABNORMAL HIGH (ref 11.5–15.5)
WBC: 15.6 10*3/uL — ABNORMAL HIGH (ref 4.0–10.5)
nRBC: 0 % (ref 0.0–0.2)

## 2021-01-21 LAB — COMPREHENSIVE METABOLIC PANEL
ALT: 35 U/L (ref 0–44)
AST: 67 U/L — ABNORMAL HIGH (ref 15–41)
Albumin: 2.7 g/dL — ABNORMAL LOW (ref 3.5–5.0)
Alkaline Phosphatase: 51 U/L (ref 38–126)
Anion gap: 22 — ABNORMAL HIGH (ref 5–15)
BUN: 42 mg/dL — ABNORMAL HIGH (ref 8–23)
CO2: 11 mmol/L — ABNORMAL LOW (ref 22–32)
Calcium: 8.7 mg/dL — ABNORMAL LOW (ref 8.9–10.3)
Chloride: 108 mmol/L (ref 98–111)
Creatinine, Ser: 2.5 mg/dL — ABNORMAL HIGH (ref 0.61–1.24)
GFR, Estimated: 23 mL/min — ABNORMAL LOW (ref >60)
Glucose, Bld: 175 mg/dL — ABNORMAL HIGH (ref 70–99)
Potassium: 4.1 mmol/L (ref 3.5–5.1)
Sodium: 141 mmol/L (ref 135–145)
Total Bilirubin: 0.8 mg/dL (ref 0.3–1.2)
Total Protein: 5.6 g/dL — ABNORMAL LOW (ref 6.5–8.1)

## 2021-01-21 LAB — URINALYSIS, COMPLETE (UACMP) WITH MICROSCOPIC
Bacteria, UA: NONE SEEN
Bilirubin Urine: NEGATIVE
Glucose, UA: 50 mg/dL — AB
Ketones, ur: NEGATIVE mg/dL
Leukocytes,Ua: NEGATIVE
Nitrite: NEGATIVE
Protein, ur: 100 mg/dL — AB
Specific Gravity, Urine: 1.016 (ref 1.005–1.030)
Squamous Epithelial / HPF: NONE SEEN (ref 0–5)
pH: 5 (ref 5.0–8.0)

## 2021-01-21 LAB — IRON AND TIBC
Iron: 40 ug/dL — ABNORMAL LOW (ref 45–182)
Saturation Ratios: 23 % (ref 17.9–39.5)
TIBC: 175 ug/dL — ABNORMAL LOW (ref 250–450)
UIBC: 135 ug/dL

## 2021-01-21 LAB — PROTIME-INR
INR: 1.6 — ABNORMAL HIGH (ref 0.8–1.2)
Prothrombin Time: 18.4 seconds — ABNORMAL HIGH (ref 11.4–15.2)

## 2021-01-21 LAB — BLOOD GAS, ARTERIAL
Acid-base deficit: 15.3 mmol/L — ABNORMAL HIGH (ref 0.0–2.0)
Bicarbonate: 10.9 mmol/L — ABNORMAL LOW (ref 20.0–28.0)
FIO2: 0.21
O2 Saturation: 98.4 %
Patient temperature: 33.9
pCO2 arterial: 23 mmHg — ABNORMAL LOW (ref 32.0–48.0)
pH, Arterial: 7.27 — ABNORMAL LOW (ref 7.350–7.450)
pO2, Arterial: 113 mmHg — ABNORMAL HIGH (ref 83.0–108.0)

## 2021-01-21 LAB — APTT: aPTT: 41 seconds — ABNORMAL HIGH (ref 24–36)

## 2021-01-21 LAB — POC SARS CORONAVIRUS 2 AG -  ED: SARS Coronavirus 2 Ag: NEGATIVE

## 2021-01-21 MED ORDER — ACETAMINOPHEN 325 MG PO TABS
325.0000 mg | ORAL_TABLET | Freq: Two times a day (BID) | ORAL | Status: DC
  Administered 2021-01-21 – 2021-02-05 (×28): 325 mg via ORAL
  Filled 2021-01-21 (×29): qty 1

## 2021-01-21 MED ORDER — IOHEXOL 9 MG/ML PO SOLN: 500.0000 mL | Freq: Once | ORAL | Status: DC | PRN

## 2021-01-21 MED ORDER — ENSURE ENLIVE PO LIQD
1.0000 | ORAL | Status: DC
  Administered 2021-01-22 – 2021-02-01 (×8): 237 mL via ORAL
  Administered 2021-02-02: 1 via ORAL
  Administered 2021-02-03 – 2021-02-04 (×2): 237 mL via ORAL

## 2021-01-21 MED ORDER — ONDANSETRON HCL 4 MG PO TABS: 4.0000 mg | ORAL_TABLET | Freq: Four times a day (QID) | ORAL | Status: DC | PRN

## 2021-01-21 MED ORDER — SIMVASTATIN 20 MG PO TABS
20.0000 mg | ORAL_TABLET | Freq: Every day | ORAL | Status: DC
  Administered 2021-01-21 – 2021-01-22 (×2): 20 mg via ORAL
  Filled 2021-01-21 (×2): qty 1

## 2021-01-21 MED ORDER — FERROUS SULFATE 325 (65 FE) MG PO TABS
325.0000 mg | ORAL_TABLET | Freq: Every day | ORAL | Status: DC
  Administered 2021-01-22 – 2021-02-05 (×14): 325 mg via ORAL
  Filled 2021-01-21 (×15): qty 1

## 2021-01-21 MED ORDER — ALBUTEROL SULFATE HFA 108 (90 BASE) MCG/ACT IN AERS
2.0000 | INHALATION_SPRAY | Freq: Four times a day (QID) | RESPIRATORY_TRACT | Status: DC | PRN
  Administered 2021-01-22 – 2021-02-03 (×7): 2 via RESPIRATORY_TRACT
  Filled 2021-01-21 (×2): qty 6.7

## 2021-01-21 MED ORDER — SODIUM CHLORIDE 0.9 % IV SOLN
2.0000 g | INTRAVENOUS | Status: AC
  Administered 2021-01-22 – 2021-01-28 (×6): 2 g via INTRAVENOUS
  Filled 2021-01-21 (×7): qty 2

## 2021-01-21 MED ORDER — METRONIDAZOLE IN NACL 5-0.79 MG/ML-% IV SOLN
500.0000 mg | Freq: Three times a day (TID) | INTRAVENOUS | Status: DC
  Administered 2021-01-21 – 2021-01-28 (×19): 500 mg via INTRAVENOUS
  Filled 2021-01-21 (×23): qty 100

## 2021-01-21 MED ORDER — SODIUM CHLORIDE 0.9 % IV SOLN: Freq: Once | INTRAVENOUS | Status: AC

## 2021-01-21 MED ORDER — VITAMIN B-12 1000 MCG PO TABS
1000.0000 ug | ORAL_TABLET | Freq: Every day | ORAL | Status: DC
  Administered 2021-01-21 – 2021-02-05 (×15): 1000 ug via ORAL
  Filled 2021-01-21 (×16): qty 1

## 2021-01-21 MED ORDER — LACTATED RINGERS IV SOLN: INTRAVENOUS | Status: AC

## 2021-01-21 MED ORDER — TRAZODONE HCL 50 MG PO TABS: 50.0000 mg | ORAL_TABLET | Freq: Every evening | ORAL | Status: DC | PRN

## 2021-01-21 MED ORDER — VANCOMYCIN HCL 500 MG/100ML IV SOLN
500.0000 mg | Freq: Once | INTRAVENOUS | Status: AC
  Administered 2021-01-21: 500 mg via INTRAVENOUS
  Filled 2021-01-21: qty 100

## 2021-01-21 MED ORDER — LACTATED RINGERS IV BOLUS
1000.0000 mL | Freq: Once | INTRAVENOUS | Status: AC
  Administered 2021-01-21: 1000 mL via INTRAVENOUS

## 2021-01-21 MED ORDER — ACETAMINOPHEN 325 MG PO TABS: 650.0000 mg | ORAL_TABLET | Freq: Four times a day (QID) | ORAL | Status: DC | PRN

## 2021-01-21 MED ORDER — SODIUM CHLORIDE 0.9 % IV SOLN
2.0000 g | Freq: Once | INTRAVENOUS | Status: AC
  Administered 2021-01-21: 2 g via INTRAVENOUS
  Filled 2021-01-21: qty 2

## 2021-01-21 MED ORDER — ACETAMINOPHEN 650 MG RE SUPP: 650.0000 mg | Freq: Four times a day (QID) | RECTAL | Status: DC | PRN

## 2021-01-21 MED ORDER — VANCOMYCIN HCL IN DEXTROSE 1-5 GM/200ML-% IV SOLN
1000.0000 mg | Freq: Once | INTRAVENOUS | Status: AC
  Administered 2021-01-21: 1000 mg via INTRAVENOUS
  Filled 2021-01-21: qty 200

## 2021-01-21 MED ORDER — LACTATED RINGERS IV BOLUS (SEPSIS)
1000.0000 mL | Freq: Once | INTRAVENOUS | Status: AC
  Administered 2021-01-21: 1000 mL via INTRAVENOUS

## 2021-01-21 MED ORDER — UMECLIDINIUM-VILANTEROL 62.5-25 MCG/INH IN AEPB
1.0000 | INHALATION_SPRAY | Freq: Two times a day (BID) | RESPIRATORY_TRACT | Status: DC | PRN
  Administered 2021-01-23: 1 via RESPIRATORY_TRACT
  Filled 2021-01-21 (×2): qty 14

## 2021-01-21 MED ORDER — ONDANSETRON HCL 4 MG/2ML IJ SOLN: 4.0000 mg | Freq: Four times a day (QID) | INTRAMUSCULAR | Status: DC | PRN

## 2021-01-21 MED ORDER — IOHEXOL 9 MG/ML PO SOLN: 500.0000 mL | ORAL | Status: DC

## 2021-01-21 MED ORDER — IOHEXOL 9 MG/ML PO SOLN
500.0000 mL | Freq: Once | ORAL | Status: AC
  Administered 2021-01-21: 500 mL via ORAL

## 2021-01-21 MED ORDER — SODIUM CHLORIDE 0.9 % IV SOLN: 2.0000 g | Freq: Once | INTRAVENOUS | Status: DC

## 2021-01-21 MED ORDER — SODIUM CHLORIDE 0.9 % IV SOLN: 10.0000 mL/h | Freq: Once | INTRAVENOUS | Status: DC

## 2021-01-21 NOTE — ED Notes (Signed)
This RN and Editor, commissioning assisting pt to bedside toilet for BM. Pt noted to have syncopal episode. VSS, responsive to verbal stimuli once layed back down. NAD noted.  Dr Francine Graven notified via secure chat

## 2021-01-21 NOTE — ED Provider Notes (Signed)
[facility] Emergency Department Provider Note ____________________________________________   Event Date/Time   First MD Initiated Contact with Patient 01/21/21 1022     (approximate)  I have reviewed the triage vital signs and the nursing notes.  HISTORY  Chief Complaint Altered Mental Status   HPI Devin Washington is a 87 y.o. Audrie Gallus presents to the ED for evaluation of altered mentation.   Chart review indicates hx HTN, HLD, stage III CKD with significant PAD and chronic LLE swelling.  Sick sinus syndrome and systolic CHF.  Atrial fibrillation on on aspirin/Plavix.  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  Patient presents to the ED from home with complaints of altered mentation.  EMS reports initially going out to the private residence for seen this morning for a "mechanical fall", or patient did not have any signs of significant trauma and did not want ED evaluation, so the medics left.  They were called back a few hours later, due to concerns for altered mentation from the family.   Patient unable to provide any relevant history upon arrival due to his confusion and delirium and HPI is therefore limited.  Grand daughter, Kenney Houseman, arrives later and provides additional history as documented below.  He is a DNR with request for medical management only.  She reports about 1-2 days of generalized confusion.  Lives at home alone, ambulatory with a cane.  Frequent check ins from family and church members.  Past Medical History:  Diagnosis Date  . A-fib ([place])   . Anemia   . Cardiomyopathy ([place])   . CHF (congestive heart failure) ([place])   . DJD (degenerative joint disease)   . Duodenal ulcer 08/09/2015  . Dysrhythmia   . Erosive esophagitis   . HH (hiatus hernia)   . HLD (hyperlipidemia)   . Hypertension    . Pacemaker   . PAD (peripheral artery disease) ([place])   . Peripheral vascular disease ([place])   . Presence of permanent cardiac pacemaker   . Prostate cancer [facility])    Prostatectomy.  . 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 Shortness of breath   . SSS (sick sinus syndrome) [facility])     Patient Active Problem List   Diagnosis Date Noted  . Severe sepsis ([place]) 01/21/2021  . Swelling of limb 07/03/2020  . Peripheral artery disease ([place]) 01/09/2020  . Benign hypertensive kidney disease with chronic kidney disease 12/21/2019  . Proteinuria 12/21/2019  . Secondary hyperparathyroidism of renal origin ([place]) 12/21/2019  . Anemia in chronic kidney disease 12/21/2019  . Atrial fibrillation ([place]) 08/24/2019  . Bradycardia 08/24/2019  . Cardiomyopathy ([place]) 08/24/2019  . CHF (congestive heart failure) ([place]) 08/24/2019  . Sick sinus syndrome ([place]) 08/24/2019  . SOB (shortness of breath) 08/24/2019  . Age-related osteoporosis with current pathological fracture with routine healing 10/08/2018  . Wheelchair bound 10/08/2018  . Pubic ramus fracture ([place]) 10/03/2018  . Acute blood loss anemia 05/28/2018  . Iron deficiency anemia 04/09/2018  . Symptomatic anemia 04/02/2018  . 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 Hypertension 11/13/2017  . 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 Protein-calorie malnutrition, severe ([place]) 08/08/2015  . Abdominal pain, epigastric 08/06/2015  . Nausea and vomiting 08/06/2015  . HTN (hypertension) 08/06/2015  . Hypercholesteremia 08/06/2015  . Back pain 08/06/2015  . CKD (chronic kidney disease), stage III ([place]) 08/06/2015  .  Abdominal pain 08/06/2015    Past Surgical History:  Procedure Laterality Date  . CATARACT EXTRACTION, BILATERAL    . COLONOSCOPY N/A 04/04/2018   Procedure: COLONOSCOPY;  Surgeon: Lin Landsman, MD;  Location: [facility] ENDOSCOPY;  Service: Gastroenterology;  Laterality: N/A;  . ESOPHAGOGASTRODUODENOSCOPY N/A 08/09/2015    Procedure: ESOPHAGOGASTRODUODENOSCOPY (EGD) looking in the stomach with a lighted tube to evaluate and treat;  Surgeon: Lollie Sails, MD;  Location: [facility] ENDOSCOPY;  Service: Endoscopy;  Laterality: N/A;  Procedure to be done tomorrow afternoon, 08/08/2015. Awaiting cardiology consult  . ESOPHAGOGASTRODUODENOSCOPY N/A 04/04/2018   Procedure: ESOPHAGOGASTRODUODENOSCOPY (EGD);  Surgeon: Lin Landsman, MD;  Location: [facility] ENDOSCOPY;  Service: Gastroenterology;  Laterality: N/A;  . EYE SURGERY    . FEMORAL-POPLITEAL BYPASS GRAFT Left 12/16/2017   Procedure: BYPASS GRAFT FEMORAL-POPLITEAL ARTERY ( OPEN POPLITEAL BYPASS );  Surgeon: Algernon Huxley, MD;  Location: [facility] ORS;  Service: Vascular;  Laterality: Left;  . FLEXIBLE SIGMOIDOSCOPY N/A 08/09/2015   Procedure: FLEXIBLE SIGMOIDOSCOPY looking up the rectum into the distal colon with a lighted tube to examine and treat;  Surgeon: Lollie Sails, MD;  Location: [facility] ENDOSCOPY;  Service: Endoscopy;  Laterality: N/A;  Procedure to be done tomorrow afternoon, 08/08/2015. Awaiting cardiology consult.  . INSERT / REPLACE / REMOVE PACEMAKER    . JOINT REPLACEMENT Left    left knee replacement  . LOWER EXTREMITY ANGIOGRAPHY Left 11/16/2017   Procedure: LOWER EXTREMITY ANGIOGRAPHY;  Surgeon: Algernon Huxley, MD;  Location: [place] CV LAB;  Service: Cardiovascular;  Laterality: Left;  . LOWER EXTREMITY ANGIOGRAPHY Right 11/30/2017   Procedure: LOWER EXTREMITY ANGIOGRAPHY;  Surgeon: Algernon Huxley, MD;  Location: [place] CV LAB;  Service: Cardiovascular;  Laterality: Right;  . LOWER EXTREMITY ANGIOGRAPHY Left 03/04/2018   Procedure: LOWER EXTREMITY ANGIOGRAPHY;  Surgeon: Algernon Huxley, MD;  Location: [place] CV LAB;  Service: Cardiovascular;  Laterality: Left;  . LOWER EXTREMITY ANGIOGRAPHY Left 04/12/2018   Procedure: LOWER EXTREMITY ANGIOGRAPHY;  Surgeon: Algernon Huxley, MD;  Location: [place] CV LAB;  Service: Cardiovascular;  Laterality:  Left;  . PACEMAKER INSERTION    . PROSTATE SURGERY    . PROSTATE SURGERY      Prior to Admission medications   Medication Sig Start Date End Date Taking? Authorizing Provider  acetaminophen (TYLENOL) 325 MG tablet Take 325 mg by mouth in the morning and at bedtime. 04/06/18   [provider]  albuterol (PROVENTIL HFA;VENTOLIN HFA) 108 (90 Base) MCG/ACT inhaler Inhale 2 puffs into the lungs every 6 (six) hours as needed for wheezing or shortness of breath. 04/23/17   Lavonia Drafts, MD  amLODipine (NORVASC) 10 MG tablet Take 10 mg by mouth daily.  08/31/18   [provider]  ANORO ELLIPTA 62.5-25 MCG/INH AEPB Inhale 1 puff into the lungs 2 (two) times daily as needed. 04/13/17   [provider]  aspirin 81 MG EC tablet Take by mouth. 12/22/17   [provider]  benazepril (LOTENSIN) 20 MG tablet Take 1 tablet (20 mg total) by mouth daily. 04/06/18   Nicholes Mango, MD  diphenhydrAMINE (BENADRYL) 25 mg capsule 25 mg every 6 (six) hours as needed.    [provider]  feeding supplement, ENSURE ENLIVE, (ENSURE ENLIVE) LIQD Take 1 Bottle by mouth daily. 04/07/18   [provider]  ferrous sulfate 325 (65 FE) MG tablet Take 1 tablet (325 mg total) by mouth 2 (two) times daily with a meal. Patient taking differently:  Take 325 mg by mouth daily with breakfast. 04/06/18   Gouru, Illene Silver, MD  furosemide (LASIX) 20 MG tablet Take 1 tablet by mouth daily. 04/23/17   [provider]  metoprolol tartrate (LOPRESSOR) 25 MG tablet Take 1 tablet (25 mg total) by mouth 2 (two) times daily. 04/06/18   Gouru, Illene Silver, MD  simvastatin (ZOCOR) 20 MG tablet Take 20 mg by mouth daily.     [provider]  simvastatin (ZOCOR) 40 MG tablet Take 40 mg by mouth daily. 07/12/20   [provider]  traMADol (ULTRAM) 50 MG tablet Take 1 tablet (50 mg total) by mouth every 6 (six) hours as needed for moderate pain or severe pain. 10/07/18   Nicholes Mango, MD   traZODone (DESYREL) 50 MG tablet Take 1 tablet by mouth at bedtime as needed for sleep.  04/13/17   [provider]  vitamin B-12 1000 MCG tablet Take 1 tablet (1,000 mcg total) by mouth daily. 04/07/18   Nicholes Mango, MD    Allergies Patient has no known allergies.  Family History  Problem Relation Age of Onset  . Lung cancer Father     Social History Social History   Tobacco Use  . Smoking status: Former Smoker    Quit date: 03/02/1958    Years since quitting: 62.9  . Smokeless tobacco: Former Systems developer    Types: Chew, Snuff  Vaping Use  . Vaping Use: Never used  Substance Use Topics  . Alcohol use: No    Alcohol/week: 0.0 standard drinks  . Drug use: No    Review of Systems  Unable to be accurately assessed due to patient's delirium and altered mentation.  ____________________________________________   PHYSICAL EXAM:  VITAL SIGNS: Vitals:   01/21/21 1215 01/21/21 1257  BP: 102/81   Pulse: 72   Resp: (!) 22   Temp:  (!) 92 F (33.3 C)  SpO2: 100%      Constitutional: Somnolent on arrival.  Awakens to verbal stimulation and noxious stimulation, follows commands in all 4 extremities before falling back asleep.  Cool skin. Later, upon reassessment after resuscitation, he is sharper and stays awake. Eyes: Conjunctivae are normal. PERRL. EOMI. Head: Atraumatic. Nose: No congestion/rhinnorhea. Mouth/Throat: Mucous membranes are dry.  Oropharynx non-erythematous. Neck: No stridor. No cervical spine tenderness to palpation. Cardiovascular: Normal rate, regular rhythm. Grossly normal heart sounds.  Good peripheral circulation. Respiratory: Normal respiratory effort.  No retractions. Lungs CTAB. Gastrointestinal: Soft , nondistended, nontender to palpation. No CVA tenderness. Musculoskeletal:  No signs of acute trauma. LLE soft tissue edema and swelling, reportedly at baseline per the grand daughter at the bedside. Palpable medial bypass grafting with strong  pulse.  LLE is distally neurovascularly intact.  No signs of trauma or discrete hematoma. Neurologic: No gross focal neurologic deficits are appreciated.  Follows commands in all 4. Skin:  Skin is cool, dry and intact. No rash noted. Psychiatric: Difficult to be assessed due to poor mentation.  ____________________________________________   LABS (all labs ordered are listed, but only abnormal results are displayed)  Labs Reviewed  LACTIC ACID, PLASMA - Abnormal; Notable for the following components:      Result Value   Lactic Acid, Venous >11.0 (*)    All other components within normal limits  COMPREHENSIVE METABOLIC PANEL - Abnormal; Notable for the following components:   CO2 11 (*)    Glucose, Bld 175 (*)    BUN 42 (*)    Creatinine, Ser 2.50 (*)    Calcium  8.7 (*)    Total Protein 5.6 (*)    Albumin 2.7 (*)    AST 67 (*)    GFR, Estimated 23 (*)    Anion gap 22 (*)    All other components within normal limits  CBC WITH DIFFERENTIAL/PLATELET - Abnormal; Notable for the following components:   WBC 15.6 (*)    RBC 2.22 (*)    Hemoglobin 6.0 (*)    HCT 20.7 (*)    MCHC 29.0 (*)    RDW 19.3 (*)    Platelets 89 (*)    Neutro Abs 13.6 (*)    Abs Immature Granulocytes 0.31 (*)    All other components within normal limits  PROTIME-INR - Abnormal; Notable for the following components:   Prothrombin Time 18.4 (*)    INR 1.6 (*)    All other components within normal limits  APTT - Abnormal; Notable for the following components:   aPTT 41 (*)    All other components within normal limits  URINALYSIS, COMPLETE (UACMP) WITH MICROSCOPIC - Abnormal; Notable for the following components:   Color, Urine YELLOW (*)    APPearance HAZY (*)    Glucose, UA 50 (*)    Hgb urine dipstick LARGE (*)    Protein, ur 100 (*)    All other components within normal limits  BLOOD GAS, ARTERIAL - Abnormal; Notable for the following components:   pH, Arterial 7.27 (*)    pCO2 arterial 23 (*)     pO2, Arterial 113 (*)    Bicarbonate 10.9 (*)    Acid-base deficit 15.3 (*)    All other components within normal limits  IRON AND TIBC - Abnormal; Notable for the following components:   Iron 40 (*)    TIBC 175 (*)    All other components within normal limits  CULTURE, BLOOD (SINGLE)  URINE CULTURE  CULTURE, BLOOD (SINGLE)  LACTIC ACID, PLASMA  POC SARS CORONAVIRUS 2 AG -  ED  TYPE AND SCREEN  PREPARE RBC (CROSSMATCH)   ____________________________________________  12 Lead EKG  Paced rhythm, rate of 72 bpm.  Normal axis.  Right bundle branch block without evidence of acute ischemia. ____________________________________________  RADIOLOGY  ED MD interpretation: CT head reviewed by me without evidence of acute intracranial hemorrhage 1 view CXR reviewed by me without evidence of acute cardiopulmonary pathology.  Official radiology report(s): CT Head Wo Contrast  Result Date: 01/21/2021 CLINICAL DATA:  Fall, altered mental status, dual anti-platelet therapy EXAM: CT HEAD WITHOUT CONTRAST TECHNIQUE: Contiguous axial images were obtained from the base of the skull through the vertex without intravenous contrast. COMPARISON:  04/01/2018 FINDINGS: Brain: Normal anatomic configuration. Parenchymal volume loss is commensurate with the patient's age. Mild periventricular white matter changes are present likely reflecting the sequela of small vessel ischemia. No abnormal intra or extra-axial mass lesion or fluid collection. No abnormal mass effect or midline shift. No evidence of acute intracranial hemorrhage or infarct. Ventricular size is mildly enlarged, commensurate with the degree of cerebral atrophy. Cerebellum unremarkable. Vascular: No asymmetric hyperdense vasculature at the skull base. Extensive intracranial atherosclerotic calcification is seen within the internal carotid arteries. Skull: Intact Sinuses/Orbits: Paranasal sinuses are clear. Orbits are unremarkable. Other: Mastoid air  cells and middle ear cavities are clear. IMPRESSION: No acute intracranial abnormality.  No calvarial fracture. Mild to moderate senescent change. Electronically Signed   By: Fidela Salisbury MD   On: 01/21/2021 11:41   DG Chest Port 1 View  Result Date: 01/21/2021 CLINICAL  DATA:  Altered mental status. EXAM: PORTABLE CHEST 1 VIEW COMPARISON:  04/03/2018 FINDINGS: Stable heart size and positioning of biventricular pacemaker. There is no evidence of pulmonary edema, consolidation, pneumothorax, nodule or pleural fluid. Visualized bony structures are unremarkable. IMPRESSION: No active disease. Electronically Signed   By: Aletta Edouard M.D.   On: 01/21/2021 11:07    ____________________________________________   PROCEDURES and INTERVENTIONS  Procedure(s) performed (including Critical Care):  .1-3 Lead EKG Interpretation Performed by: Vladimir Crofts, MD Authorized by: Vladimir Crofts, MD     Interpretation: normal     ECG rate:  70   ECG rate assessment: normal     Rhythm: paced     Ectopy: none     Conduction: normal   .Critical Care Performed by: Vladimir Crofts, MD Authorized by: Vladimir Crofts, MD   Critical care provider statement:    Critical care time (minutes):  45   Critical care was necessary to treat or prevent imminent or life-threatening deterioration of the following conditions:  Circulatory failure, dehydration and sepsis   Critical care was time spent personally by me on the following activities:  Discussions with consultants, evaluation of patient's response to treatment, examination of patient, ordering and performing treatments and interventions, ordering and review of laboratory studies, ordering and review of radiographic studies, pulse oximetry, re-evaluation of patient's condition, obtaining history from patient or surrogate and review of old charts    Medications  lactated ringers infusion (0 mLs Intravenous Hold 01/21/21 1140)  vancomycin (VANCOREADY) IVPB 500 mg/100 mL  (500 mg Intravenous New Bag/Given 01/21/21 1256)  0.9 %  sodium chloride infusion (0 mL/hr Intravenous Hold 01/21/21 1209)  lactated ringers bolus 1,000 mL (0 mLs Intravenous Stopped 01/21/21 1256)  lactated ringers bolus 1,000 mL (0 mLs Intravenous Stopped 01/21/21 1256)  ceFEPIme (MAXIPIME) 2 g in sodium chloride 0.9 % 100 mL IVPB (0 g Intravenous Stopped 01/21/21 1228)  vancomycin (VANCOCIN) IVPB 1000 mg/200 mL premix (0 mg Intravenous Stopped 01/21/21 1256)  0.9 %  sodium chloride infusion ( Intravenous New Bag/Given 01/21/21 1317)    ____________________________________________   MDM / ED COURSE   87 year old patient with a DNR presents from home altered with evidence of sepsis and acute anemia, requiring antibiotics, transfusions and medical admission.  Core temperature of 93 F and soft pressures on arrival, responsive to IV fluids.  No need for pressors at this time.  Exam demonstrates nonfocal confusion and delirium without discrete signs of trauma, distress or neurovascular deficits.  Apparently intact bypass grafting to his left lower extremity and no stigmata of bleeding.  Chaperoned DRE demonstrates no evidence of Hemoccult positive GI bleeding.  Blood work demonstrates signs of sepsis and normocytic anemia.  Sepsis protocols were followed.  Warm fluids were initiated.  Consent for blood transfusion and home DNR obtained from granddaughter.  We will transfuse 1 unit of blood and admit to hospitalist medicine for further work-up and management of his septic condition.  Clinical Course as of 01/21/21 1335  Mon Jan 21, 2021  1037 RN informs me of core temp of 48F, sepsis orders placed [DS]  1139 Reassessed.  Pressures improving with fluids.  Clinically improved and sharper.  Grand daughter now at the bedside.  She indicates that he does have a DNR form in his refrigerator, and I asked her to go get this paperwork.  She reports that patient is not married and has no documented healthcare power of  attorney.  She is the one that takes him to all  of his doctors appointments and helps with his decisions. [DS]  3832 ABG noted with metabolic acidosis. [DS]  [place].  We discussed risk and benefit of blood transfusion, she is agreeable. [DS]  1312 DRE performed, chaperoned by nurse Denton Ar, Hemoccult negative brown stool in the rectal vault. Consent for blood signed by granddaughter [DS]    Clinical Course User Index [DS] Vladimir Crofts, MD    ____________________________________________   FINAL CLINICAL IMPRESSION(S) / ED DIAGNOSES  Final diagnoses:  Sepsis without acute organ dysfunction, due to unspecified organism ([place])  Lactic acidosis  Metabolic encephalopathy  AKI (acute kidney injury) [facility])     ED Discharge Orders    None       Charlane Westry   Note:  This document was prepared using Dragon voice recognition software and may include unintentional dictation errors.   Vladimir Crofts, MD 01/21/21 [phone]

## 2021-01-21 NOTE — ED Notes (Signed)
Resumed care from brianna rn.  Pt awake and talking  Family with pt.

## 2021-01-21 NOTE — Consult Note (Signed)
CODE SEPSIS - PHARMACY COMMUNICATION  **Broad Spectrum Antibiotics should be administered within 1 hour of Sepsis diagnosis**  Time Code Sepsis Called/Page Received: 1129  Antibiotics Ordered: Vancomycin and Cefepime  Time of 1st antibiotic administration: 1142  Additional action taken by pharmacy: NA  If necessary, Name of Provider/Nurse Contacted: NA    Chrystie Nose ,PharmD student  01/21/2021  12:13 PM

## 2021-01-21 NOTE — ED Notes (Signed)
In and out performed with Amy RN

## 2021-01-21 NOTE — Consult Note (Addendum)
PHARMACY -  BRIEF ANTIBIOTIC NOTE   Pharmacy has received consult(s) for vancomycin and cefepime from an ED provider.  The patient's profile has been reviewed for ht/wt/allergies/indication/available labs.    One time order(s) placed for cefepime 2g and vancomycin 1500 mg  Further antibiotics/pharmacy consults should be ordered by admitting physician if indicated.                       Thank you, Chrystie Nose, Pharmacy Student 01/21/2021  12:10 PM

## 2021-01-21 NOTE — ED Notes (Signed)
Dr Tamala Julian notified of pH 7.27 PCO2 23 Bicarb 10.9 Orders to be placed

## 2021-01-21 NOTE — ED Notes (Signed)
Dr Tamala Julian notified lactic greater than 11, awaiting orders

## 2021-01-21 NOTE — ED Notes (Signed)
Devin Mylar rn going to ct with pt and then to floor with pt.  family aware.

## 2021-01-21 NOTE — Sepsis Progress Note (Signed)
Notified provider of need to order repeat lactic acid. ° °

## 2021-01-21 NOTE — Sepsis Progress Note (Signed)
Notified bedside nurse of need to draw repeat lactic acid. 

## 2021-01-21 NOTE — ED Notes (Signed)
Family at bedside. 

## 2021-01-21 NOTE — ED Notes (Signed)
RT at bedside to collect abg

## 2021-01-21 NOTE — ED Notes (Signed)
Pt placed on bair hugger

## 2021-01-21 NOTE — ED Notes (Signed)
Report messaged to [employer]

## 2021-01-21 NOTE — ED Notes (Signed)
Blood transfusion started.  Family with pt.  U/s in with pt also

## 2021-01-21 NOTE — ED Triage Notes (Addendum)
Pt to ED [facility] from home for chief complaint of ams decreased alertness. Ems reports was at house 2 hours earlier for fall, no injuries noted.  Upon arrival pt 76% on RA.  Pt oreinted to verbal stimuli. RR unlabored. 100% on RA Oriented, following commands and answering questions appropriately intermittently.  Left leg swollen. Ems reports thsi is normal

## 2021-01-21 NOTE — Sepsis Progress Note (Signed)
Sepsis protocol is being followed by [facility]. 

## 2021-01-21 NOTE — ED Notes (Signed)
Pt had a large bm and was cleaned up by staff.

## 2021-01-21 NOTE — ED Notes (Signed)
Warmed NS 2106ml/Hr started, verbal order Dr Tamala Julian

## 2021-01-21 NOTE — Consult Note (Signed)
Pharmacy Antibiotic Note  Devin Washington is a 87 y.o. male admitted on 01/21/2021 due with AMS/decreased alertness and fall, with no injuries. Pt was hypothermic and hypotensive upon admission. WBC elevated at 15.6. PMH significant for pacemaker insertion Pharmacy has been consulted for Cefepime and Vancomycin dosing for sepsis. Pt elevated Scr of 2.5 upon admission, previous levels around 1.9  Plan: Cefepime 2g IV q24h- based on current renal function Pt already received vancomycin 1500 mg LD, due to AKI, hold off scheduled dosing and reassess renal function tomorrow.  Follow up cultures, continue to monitor renal function and adjust doses as needed.   Height: 5\' 10"  (177.8 cm) Weight: 73 kg (160 lb 15 oz) IBW/kg (Calculated) : 73  Temp (24hrs), Avg:92.5 F (33.6 C), Min:92 F (33.3 C), Max:93 F (33.9 C)  Recent Labs  Lab 01/21/21 1024  WBC 15.6*  CREATININE 2.50*  LATICACIDVEN >11.0*  >11.0*    Estimated Creatinine Clearance: 18.3 mL/min (A) (by C-G formula based on SCr of 2.5 mg/dL (H)).    No Known Allergies  Antimicrobials this admission: 01/21/21 >> cefepime  01/21/21 >> metronidazole 01/21/21 >> vancomycin  Dose adjustments this admission: NA   Microbiology results: 2/7 BCx: pending 2/7 UCx: pending   Thank you for allowing pharmacy to be a part of this patient's care.  Chrystie Nose, PharmD Student 01/21/2021 3:11 PM

## 2021-01-21 NOTE — ED Notes (Signed)
bair hugger adjusted on pt from pt moving

## 2021-01-21 NOTE — H&P (Addendum)
History and Physical    Devin Washington DVV:[id number] DOB: [birth year] DOA: 01/21/2021  PCP: Albina Billet, MD   Patient coming from: Home  I have personally briefly reviewed patient's old medical records in [place]  Chief Complaint: Change in mental status Most of the history was obtained from patient's granddaughter at the bedside.  HPI: Devin Washington is a 87 y.o. male with medical history significant for iron deficiency anemia, history of erosive esophagitis, history of peptic ulcer disease, hypertension, status post pacemaker insertion for sick sinus syndrome, peripheral vascular disease who was brought into the ER by EMS for evaluation of altered mental status.  Patient lives alone and ambulates with a cane as well as a rolling walker.  He has a life alert as well.  Per his granddaughter he probably fell sometime during the night or in the early hours of the morning but failed to activate his life alert.  Per EMS his room air pulse oximetry was 76%.  He was also lethargic but able to arouse to verbal stimuli. Patient was hypotensive upon arrival to the ER with systolic blood pressure in the 90s, he was tachypneic with respiratory rate of 23 - 27. Per patient's granddaughter he was seen by his grandson about 24 hours prior to his presentation to the ER and appeared weak and tired but had no other constitutional symptoms.  She states that his appetite has been good and that he has not complained about any pain.  Patient denies having any chest or abdominal pain but tells me that his stools have been dark for the last couple of days.  He denies feeling dizzy or lightheaded, he does not have a cough, no abdominal pain, no nausea, no vomiting, no diarrhea, no constipation, no cough. 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  The patient goes to the cancer center monthly for iron infusions for his anemia. Arterial blood gas  7.27/23/113/10.9/98.4 Sodium 141, potassium 4.1, chloride 108, bicarb,, glucose 175, BUN 42, creatinine 2.50, calcium 8.7, alkaline phosphatase 51, albumin 2.7, AST 67, ALT 35, total protein 5.6, serum iron 40, total iron-binding capacity 175, lactic acid greater than 11, white count 15.6, hemoglobin 6.0, hematocrit 20.7, MCV 93.2, RDW 19.3, platelet count 89, 18.4, INR 1.6 CT scan of the head without contrast shows no acute intracranial abnormality Chest x-ray reviewed by me shows no obvious infiltrate or effusion. Twelve-lead EKG reviewed by me shows sinus rhythm with LVH   ED Course: Patient is a 87 year old African-American male who was brought into the ER by EMS for evaluation of change in mental status.  Patient appears to be septic as evidenced by hypothermia currently on a Bair hugger, tachypnea, hypotension responded to IV fluid resuscitation, lactic acid of 11 and leukocytosis of 15,000.  No obvious source of sepsis at this time.  Patient also noted to have worsening anemia and 3 weeks ago had a hemoglobin of 7.8g/dl, on admission today his hemoglobin is 6g/dl.  Hemoccult is negative by ER provider. Patient will be admitted to the hospital for further evaluation.  Review of Systems: As per HPI otherwise all systems reviewed and negative.    Past Medical History:  Diagnosis Date  . A-fib ([place])   . Anemia   . Cardiomyopathy ([place])   . CHF (congestive heart failure) ([place])   . DJD (degenerative joint disease)   . Duodenal ulcer 08/09/2015  . Dysrhythmia   .  Erosive esophagitis   . HH (hiatus hernia)   . HLD (hyperlipidemia)   . Hypertension   . Pacemaker   . PAD (peripheral artery disease) ([place])   . Peripheral vascular disease ([place])   . Presence of permanent cardiac pacemaker   . Prostate cancer [facility])    Prostatectomy.  . 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 Shortness of breath   . SSS (sick sinus syndrome) [facility])     Past Surgical History:  Procedure Laterality Date  . CATARACT  EXTRACTION, BILATERAL    . COLONOSCOPY N/A 04/04/2018   Procedure: COLONOSCOPY;  Surgeon: Lin Landsman, MD;  Location[facility] ENDOSCOPY;  Service: Gastroenterology;  Laterality: N/A;  . ESOPHAGOGASTRODUODENOSCOPY N/A 08/09/2015   Procedure: ESOPHAGOGASTRODUODENOSCOPY (EGD) looking in the stomach with a lighted tube to evaluate and treat;  Surgeon: Lollie Sails, MD;  Location: [facility] ENDOSCOPY;  Service: Endoscopy;  Laterality: N/A;  Procedure to be done tomorrow afternoon, 08/08/2015. Awaiting cardiology consult  . ESOPHAGOGASTRODUODENOSCOPY N/A 04/04/2018   Procedure: ESOPHAGOGASTRODUODENOSCOPY (EGD);  Surgeon: Lin Landsman, MD;  Location: [facility] ENDOSCOPY;  Service: Gastroenterology;  Laterality: N/A;  . EYE SURGERY    . FEMORAL-POPLITEAL BYPASS GRAFT Left 12/16/2017   Procedure: BYPASS GRAFT FEMORAL-POPLITEAL ARTERY ( OPEN POPLITEAL BYPASS );  Surgeon: Algernon Huxley, MD;  Location: [facility] ORS;  Service: Vascular;  Laterality: Left;  . FLEXIBLE SIGMOIDOSCOPY N/A 08/09/2015   Procedure: FLEXIBLE SIGMOIDOSCOPY looking up the rectum into the distal colon with a lighted tube to examine and treat;  Surgeon: Lollie Sails, MD;  Location: [facility] ENDOSCOPY;  Service: Endoscopy;  Laterality: N/A;  Procedure to be done tomorrow afternoon, 08/08/2015. Awaiting cardiology consult.  . INSERT / REPLACE / REMOVE PACEMAKER    . JOINT REPLACEMENT Left    left knee replacement  . LOWER EXTREMITY ANGIOGRAPHY Left 11/16/2017   Procedure: LOWER EXTREMITY ANGIOGRAPHY;  Surgeon: Algernon Huxley, MD;  Location: [place] CV LAB;  Service: Cardiovascular;  Laterality: Left;  . LOWER EXTREMITY ANGIOGRAPHY Right 11/30/2017   Procedure: LOWER EXTREMITY ANGIOGRAPHY;  Surgeon: Algernon Huxley, MD;  Location: [place] CV LAB;  Service: Cardiovascular;  Laterality: Right;  . LOWER EXTREMITY ANGIOGRAPHY Left 03/04/2018   Procedure: LOWER EXTREMITY ANGIOGRAPHY;  Surgeon: Algernon Huxley, MD;  Location: [place] CV LAB;   Service: Cardiovascular;  Laterality: Left;  . LOWER EXTREMITY ANGIOGRAPHY Left 04/12/2018   Procedure: LOWER EXTREMITY ANGIOGRAPHY;  Surgeon: Algernon Huxley, MD;  Location: [place] CV LAB;  Service: Cardiovascular;  Laterality: Left;  . PACEMAKER INSERTION    . PROSTATE SURGERY    . PROSTATE SURGERY       reports that he quit smoking about 62 years ago. He has quit using smokeless tobacco.  His smokeless tobacco use included chew and snuff. He reports that he does not drink alcohol and does not use drugs.  No Known Allergies  Family History  Problem Relation Age of Onset  . Lung cancer Father      Prior to Admission medications   Medication Sig Start Date End Date Taking? Authorizing Provider  acetaminophen (TYLENOL) 325 MG tablet Take 325 mg by mouth in the morning and at bedtime. 04/06/18   [provider]  albuterol (PROVENTIL HFA;VENTOLIN HFA) 108 (90 Base) MCG/ACT inhaler Inhale 2 puffs into the lungs every 6 (six) hours as needed for wheezing or shortness of breath. 04/23/17   Lavonia Drafts, MD  amLODipine (NORVASC) 10 MG tablet Take 10 mg by mouth daily.  08/31/18  [provider]  ANORO ELLIPTA 62.5-25 MCG/INH AEPB Inhale 1 puff into the lungs 2 (two) times daily as needed. 04/13/17   [provider]  aspirin 81 MG EC tablet Take by mouth. 12/22/17   [provider]  benazepril (LOTENSIN) 20 MG tablet Take 1 tablet (20 mg total) by mouth daily. 04/06/18   Nicholes Mango, MD  diphenhydrAMINE (BENADRYL) 25 mg capsule 25 mg every 6 (six) hours as needed.    [provider]  feeding supplement, ENSURE ENLIVE, (ENSURE ENLIVE) LIQD Take 1 Bottle by mouth daily. 04/07/18   [provider]  ferrous sulfate 325 (65 FE) MG tablet Take 1 tablet (325 mg total) by mouth 2 (two) times daily with a meal. Patient taking differently: Take 325 mg by mouth daily with breakfast. 04/06/18   Gouru, Illene Silver, MD  furosemide (LASIX) 20 MG tablet Take 1  tablet by mouth daily. 04/23/17   [provider]  metoprolol tartrate (LOPRESSOR) 25 MG tablet Take 1 tablet (25 mg total) by mouth 2 (two) times daily. 04/06/18   Gouru, Illene Silver, MD  simvastatin (ZOCOR) 20 MG tablet Take 20 mg by mouth daily.     [provider]  simvastatin (ZOCOR) 40 MG tablet Take 40 mg by mouth daily. 07/12/20   [provider]  traMADol (ULTRAM) 50 MG tablet Take 1 tablet (50 mg total) by mouth every 6 (six) hours as needed for moderate pain or severe pain. 10/07/18   Nicholes Mango, MD  traZODone (DESYREL) 50 MG tablet Take 1 tablet by mouth at bedtime as needed for sleep.  04/13/17   [provider]  vitamin B-12 1000 MCG tablet Take 1 tablet (1,000 mcg total) by mouth daily. 04/07/18   Nicholes Mango, MD    Physical Exam: Vitals:   01/21/21 1145 01/21/21 1200 01/21/21 1215 01/21/21 1257  BP: (!) 117/91 105/82 102/81   Pulse: 68 73 72   Resp: 17 (!) 24 (!) 22   Temp:    (!) 92 F (33.3 C)  TempSrc:    Rectal  SpO2: 100% 98% 100%   Weight:      Height:         Vitals:   01/21/21 1145 01/21/21 1200 01/21/21 1215 01/21/21 1257  BP: (!) 117/91 105/82 102/81   Pulse: 68 73 72   Resp: 17 (!) 24 (!) 22   Temp:    (!) 92 F (33.3 C)  TempSrc:    Rectal  SpO2: 100% 98% 100%   Weight:      Height:        Constitutional: NAD, alert and oriented x 2.  To person and place Eyes: PERRL, lids and conjunctivae pallor ENMT: Mucous membranes are dry.  Neck: normal, supple, no masses, no thyromegaly Respiratory: clear to auscultation bilaterally, no wheezing, no crackles. Normal respiratory effort. No accessory muscle use.  Cardiovascular: Regular rate and rhythm, no murmurs / rubs / gallops. No extremity edema. 2+ pedal pulses. No carotid bruits.  Left calf swelling Abdomen: no tenderness, soft, no masses palpated. No hepatosplenomegaly. Bowel sounds positive.  Musculoskeletal: no clubbing / cyanosis. No joint deformity upper and lower  extremities.  Skin: no rashes, lesions, ulcers.  Neurologic: No gross focal neurologic deficit.  Generalized weakness Psychiatric: Normal mood and affect.   Labs on Admission: I have personally reviewed following labs and imaging studies  CBC: Recent Labs  Lab 01/21/21 1024  WBC 15.6*  NEUTROABS 13.6*  HGB 6.0*  HCT 20.7*  MCV  93.2  PLT 89*   Basic Metabolic Panel: Recent Labs  Lab 01/21/21 1024  NA 141  K 4.1  CL 108  CO2 11*  GLUCOSE 175*  BUN 42*  CREATININE 2.50*  CALCIUM 8.7*   GFR: Estimated Creatinine Clearance: 18.3 mL/min (A) (by C-G formula based on SCr of 2.5 mg/dL (H)). Liver Function Tests: Recent Labs  Lab 01/21/21 1024  AST 67*  ALT 35  ALKPHOS 51  BILITOT 0.8  PROT 5.6*  ALBUMIN 2.7*   No results for input(s): LIPASE, AMYLASE in the last 168 hours. No results for input(s): AMMONIA in the last 168 hours. Coagulation Profile: Recent Labs  Lab 01/21/21 1024  INR 1.6*   Cardiac Enzymes: No results for input(s): CKTOTAL, CKMB, CKMBINDEX, TROPONINI in the last 168 hours. BNP (last 3 results) No results for input(s): PROBNP in the last 8760 hours. HbA1C: No results for input(s): HGBA1C in the last 72 hours. CBG: No results for input(s): GLUCAP in the last 168 hours. Lipid Profile: No results for input(s): CHOL, HDL, LDLCALC, TRIG, CHOLHDL, LDLDIRECT in the last 72 hours. Thyroid Function Tests: No results for input(s): TSH, T4TOTAL, FREET4, T3FREE, THYROIDAB in the last 72 hours. Anemia Panel: Recent Labs    01/21/21 1024  TIBC 175*  IRON 40*   Urine analysis:    Component Value Date/Time   COLORURINE YELLOW (A) 01/21/2021 1055   APPEARANCEUR HAZY (A) 01/21/2021 1055   LABSPEC 1.016 01/21/2021 1055   PHURINE 5.0 01/21/2021 1055   GLUCOSEU 50 (A) 01/21/2021 1055   HGBUR LARGE (A) 01/21/2021 1055   BILIRUBINUR NEGATIVE 01/21/2021 1055   KETONESUR NEGATIVE 01/21/2021 1055   PROTEINUR 100 (A) 01/21/2021 1055   NITRITE NEGATIVE  01/21/2021 1055   LEUKOCYTESUR NEGATIVE 01/21/2021 1055    Radiological Exams on Admission: CT Head Wo Contrast  Result Date: 01/21/2021 CLINICAL DATA:  Fall, altered mental status, dual anti-platelet therapy EXAM: CT HEAD WITHOUT CONTRAST TECHNIQUE: Contiguous axial images were obtained from the base of the skull through the vertex without intravenous contrast. COMPARISON:  04/01/2018 FINDINGS: Brain: Normal anatomic configuration. Parenchymal volume loss is commensurate with the patient's age. Mild periventricular white matter changes are present likely reflecting the sequela of small vessel ischemia. No abnormal intra or extra-axial mass lesion or fluid collection. No abnormal mass effect or midline shift. No evidence of acute intracranial hemorrhage or infarct. Ventricular size is mildly enlarged, commensurate with the degree of cerebral atrophy. Cerebellum unremarkable. Vascular: No asymmetric hyperdense vasculature at the skull base. Extensive intracranial atherosclerotic calcification is seen within the internal carotid arteries. Skull: Intact Sinuses/Orbits: Paranasal sinuses are clear. Orbits are unremarkable. Other: Mastoid air cells and middle ear cavities are clear. IMPRESSION: No acute intracranial abnormality.  No calvarial fracture. Mild to moderate senescent change. Electronically Signed   By: Fidela Salisbury MD   On: 01/21/2021 11:41   DG Chest Port 1 View  Result Date: 01/21/2021 CLINICAL DATA:  Altered mental status. EXAM: PORTABLE CHEST 1 VIEW COMPARISON:  04/03/2018 FINDINGS: Stable heart size and positioning of biventricular pacemaker. There is no evidence of pulmonary edema, consolidation, pneumothorax, nodule or pleural fluid. Visualized bony structures are unremarkable. IMPRESSION: No active disease. Electronically Signed   By: Aletta Edouard M.D.   On: 01/21/2021 11:07    EKG: Independently reviewed.  Sinus rhythm LVH  Assessment/Plan Principal Problem:   Severe sepsis  (HCC) Active Problems:   CKD (chronic kidney disease), stage III (HCC)   Hypertension   CHF (congestive heart failure) (  [place])   Peripheral artery disease ([place])   Anemia in chronic kidney disease      Severe sepsis (POA) Of unclear etiology As evidenced by hypothermia, hypotension that responded to IV fluid resuscitation, tachypnea, leukocytosis and elevated lactic acid of 11. Aggressive IV fluid resuscitation Treat patient empirically with vancomycin, cefepime and Flagyl until culture results become available Follow-up results of abdomen and pelvis    Chronic systolic heart failure Stable and not acutely exacerbated Last known LVEF 35 to 40% from an echocardiogram done in 2018 Hold metoprolol, Lasix, amlodipine and benazepril    Acute on chronic iron deficiency anemia ??  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 after hydration    Peripheral vascular disease Hold aspirin due to anemia requiring blood transfusion Continue statins   Syncopal episode Witnessed in the ER while patient was trying to use the commode ??  Vasovagal syncopal Monitor patient closely to rule out arrhythmias   DVT prophylaxis: SCD Code Status: DNR Family Communication: Greater than  50% of time was spent discussing patient's condition and plan of care with his granddaughter at the bedside.  All questions and concerns have been addressed.  CODE STATUS was discussed and patient is a DNR Disposition Plan: Back to previous home environment Consults called: None    Yentl Verge MD [facility]     01/21/2021, 2:44 PM

## 2021-01-21 NOTE — ED Notes (Signed)
Pt to CT

## 2021-01-22 ENCOUNTER — Encounter: Payer: Self-pay | Admitting: Internal Medicine

## 2021-01-22 ENCOUNTER — Inpatient Hospital Stay: Payer: Medicare HMO

## 2021-01-22 DIAGNOSIS — N1832 Chronic kidney disease, stage 3b: Secondary | ICD-10-CM | POA: Diagnosis not present

## 2021-01-22 DIAGNOSIS — I739 Peripheral vascular disease, unspecified: Secondary | ICD-10-CM

## 2021-01-22 DIAGNOSIS — R6520 Severe sepsis without septic shock: Secondary | ICD-10-CM | POA: Diagnosis not present

## 2021-01-22 DIAGNOSIS — A419 Sepsis, unspecified organism: Secondary | ICD-10-CM | POA: Diagnosis not present

## 2021-01-22 LAB — LACTIC ACID, PLASMA: Lactic Acid, Venous: 2.3 mmol/L (ref 0.5–1.9)

## 2021-01-22 LAB — PROCALCITONIN: Procalcitonin: 3.62 ng/mL

## 2021-01-22 LAB — BASIC METABOLIC PANEL
Anion gap: 10 (ref 5–15)
BUN: 51 mg/dL — ABNORMAL HIGH (ref 8–23)
CO2: 22 mmol/L (ref 22–32)
Calcium: 8.2 mg/dL — ABNORMAL LOW (ref 8.9–10.3)
Chloride: 107 mmol/L (ref 98–111)
Creatinine, Ser: 2.35 mg/dL — ABNORMAL HIGH (ref 0.61–1.24)
GFR, Estimated: 25 mL/min — ABNORMAL LOW (ref >60)
Glucose, Bld: 115 mg/dL — ABNORMAL HIGH (ref 70–99)
Potassium: 4.2 mmol/L (ref 3.5–5.1)
Sodium: 139 mmol/L (ref 135–145)

## 2021-01-22 LAB — URINE CULTURE: Culture: NO GROWTH

## 2021-01-22 LAB — PROTIME-INR
INR: 1.4 — ABNORMAL HIGH (ref 0.8–1.2)
Prothrombin Time: 16.2 seconds — ABNORMAL HIGH (ref 11.4–15.2)

## 2021-01-22 LAB — CORTISOL-AM, BLOOD: Cortisol - AM: 38.3 ug/dL — ABNORMAL HIGH (ref 6.7–22.6)

## 2021-01-22 LAB — CBC WITH DIFFERENTIAL/PLATELET
Abs Immature Granulocytes: 0.1 10*3/uL — ABNORMAL HIGH (ref 0.00–0.07)
Basophils Absolute: 0 10*3/uL (ref 0.0–0.1)
Basophils Relative: 0 %
Eosinophils Absolute: 0 10*3/uL (ref 0.0–0.5)
Eosinophils Relative: 0 %
HCT: 15.8 % — ABNORMAL LOW (ref 39.0–52.0)
Hemoglobin: 5.1 g/dL — ABNORMAL LOW (ref 13.0–17.0)
Immature Granulocytes: 1 %
Lymphocytes Relative: 4 %
Lymphs Abs: 0.7 10*3/uL (ref 0.7–4.0)
MCH: 27.6 pg (ref 26.0–34.0)
MCHC: 32.3 g/dL (ref 30.0–36.0)
MCV: 85.4 fL (ref 80.0–100.0)
Monocytes Absolute: 1 10*3/uL (ref 0.1–1.0)
Monocytes Relative: 6 %
Neutro Abs: 16.2 10*3/uL — ABNORMAL HIGH (ref 1.7–7.7)
Neutrophils Relative %: 89 %
Platelets: 120 10*3/uL — ABNORMAL LOW (ref 150–400)
RBC: 1.85 MIL/uL — ABNORMAL LOW (ref 4.22–5.81)
RDW: 18.6 % — ABNORMAL HIGH (ref 11.5–15.5)
WBC: 18 10*3/uL — ABNORMAL HIGH (ref 4.0–10.5)
nRBC: 0 % (ref 0.0–0.2)

## 2021-01-22 LAB — CK: Total CK: 837 U/L — ABNORMAL HIGH (ref 49–397)

## 2021-01-22 LAB — VANCOMYCIN, RANDOM: Vancomycin Rm: 19

## 2021-01-22 NOTE — Progress Notes (Addendum)
Progress Note    SARGON SCOUTEN  PTW:[id number] DOB: 1923/05/29  DOA: 01/21/2021 PCP: Albina Billet, MD      Brief Narrative:    Medical records reviewed and are as summarized below:  Devin Washington is a 87 y.o. male  with medical history significant for iron deficiency anemia, history of erosive esophagitis, history of peptic ulcer disease, hypertension, status post pacemaker insertion for sick sinus syndrome, peripheral vascular disease who was brought to the hospital because of altered mental status.  He lives at home alone.  He has been having dark stools at home.  He has chronic anemia and he goes to the  infusion center for periodic blood transfusions. He was hypothermic, hypoxic and hypotensive on admission.  Lactic acid was significantly elevated (greater than 11)  He was admitted to the hospital for severe sepsis.  He was also found to have severe anemia with hemoglobin of 6.   Assessment/Plan:   Principal Problem:   Severe sepsis (HCC) Active Problems:   CKD (chronic kidney disease), stage III (HCC)   Hypertension   CHF (congestive heart failure) ([place])   Peripheral artery disease (HCC)   Anemia in chronic kidney disease    Body mass index is 24.81 kg/m.    SIRS or severe sepsis, lactic acidosis, diarrhea, enterocolitis on CT abdomen pelvis:   Continue empiric IV antibiotics for now.  IV fluids have been discontinued because of underlying CHF.  Lactic acid level also improved.  Initial chest x-ray was unremarkable.  Repeat chest x-ray today.  Acute on chronic anemia, iron deficiency anemia: s/p transfusion with 1 unit of PRBCs on 01/21/2021.  Monitor H&H closely.  AKI on CKD stage IIIb: Monitor BMP off of IV fluids.  PVD with history of bilateral popliteal and iliac artery aneurysm: Aspirin has been held.  Venous Doppler of the left lower extremity showed popliteal artery aneurysm which is a normal finding.  He sees Dr. Lucky Cowboy, vascular surgeon, as an  outpatient.  S/p syncope in the emergency room 01/21/2019 while being assisted to the bedside toilet for bowel movement.  Likely vasovagal syncope from defecation and syncope.    Other comorbidities include erosive esophagitis, duodenal ulcer sick sinus syndrome status post permanent pacemaker, degenerative joint disease, hyperlipidemia, hypertension  Diet Order            Diet 2 gram sodium Room service appropriate? Yes; Fluid consistency: Thin  Diet effective now                    Consultants:  None  Procedures:  None    Medications:   . acetaminophen  325 mg Oral BID  . feeding supplement  1 Bottle Oral Q24H  . ferrous sulfate  325 mg Oral Q breakfast  . simvastatin  20 mg Oral Q2200  . cyanocobalamin  1,000 mcg Oral Daily   Continuous Infusions: . ceFEPime (MAXIPIME) IV 2 g (01/22/21 1237)  . metronidazole 500 mg (01/22/21 7001)     Anti-infectives (From admission, onward)   Start     Dose/Rate Route Frequency Ordered Stop   01/22/21 1200  ceFEPIme (MAXIPIME) 2 g in sodium chloride 0.9 % 100 mL IVPB        2 g 200 mL/hr over 30 Minutes Intravenous Every 24 hours 01/21/21 1649     01/21/21 1600  vancomycin (VANCOCIN) IVPB 1000 mg/200 mL premix        1,000 mg 200 mL/hr over 60 Minutes Intravenous  Once 01/21/21 1438 01/21/21 2107   01/21/21 1500  metroNIDAZOLE (FLAGYL) IVPB 500 mg        500 mg 100 mL/hr over 60 Minutes Intravenous Every 8 hours 01/21/21 1438     01/21/21 1445  ceFEPIme (MAXIPIME) 2 g in sodium chloride 0.9 % 100 mL IVPB  Status:  Discontinued        2 g 200 mL/hr over 30 Minutes Intravenous  Once 01/21/21 1438 01/21/21 1440   01/21/21 1230  vancomycin (VANCOREADY) IVPB 500 mg/100 mL        500 mg 100 mL/hr over 60 Minutes Intravenous  Once 01/21/21 1141 01/21/21 1355   01/21/21 1130  ceFEPIme (MAXIPIME) 2 g in sodium chloride 0.9 % 100 mL IVPB        2 g 200 mL/hr over 30 Minutes Intravenous  Once 01/21/21 1129 01/21/21 1228    01/21/21 1130  vancomycin (VANCOCIN) IVPB 1000 mg/200 mL premix        1,000 mg 200 mL/hr over 60 Minutes Intravenous  Once 01/21/21 1129 01/21/21 1256             Family Communication/Anticipated D/C date and plan/Code Status   DVT prophylaxis: SCDs Start: 01/21/21 1437     Code Status: DNR  Family Communication: None Disposition Plan:    Status is: Inpatient  Remains inpatient appropriate because:IV treatments appropriate due to intensity of illness or inability to take PO   Dispo: The patient is from: Home              Anticipated d/c is to: Home              Anticipated d/c date is: 2 days              Patient currently is not medically stable to d/c.   Difficult to place patient No           Subjective:   C/o shortness of breath but he said this is not new for him.  No vomiting, chest pain or abdominal pain.  He said he had diarrhea yesterday but he attributed it to laxatives.  He has chronic left leg swelling but no pain.  Objective:    Vitals:   01/22/21 0413 01/22/21 0414 01/22/21 0815 01/22/21 1206  BP: (!) 96/47 (!) 96/47 133/66 113/74  Pulse: 91 91 98 91  Resp: 18 18 18 20   Temp: 98.5 F (36.9 C) 98.5 F (36.9 C) 98.5 F (36.9 C) 98.4 F (36.9 C)  TempSrc:  Oral Oral Oral  SpO2: 100% 100% 100% 100%  Weight:      Height:       No data found.   Intake/Output Summary (Last 24 hours) at 01/22/2021 1540 Last data filed at 01/22/2021 0648 Gross per 24 hour  Intake 2035.12 ml  Output --  Net 2035.12 ml   Filed Weights   01/21/21 1023 01/22/21 0223  Weight: 73 kg 78.4 kg    Exam:  GEN: NAD SKIN: No rash EYES: EOMI ENT: MMM CV: RRR PULM: CTA B ABD: soft, ND, NT, +BS CNS: AAO x 3, non focal EXT: Right leg pitting edema (2+).  Left leg is significantly swollen but nontender.        Data Reviewed:   I have personally reviewed following labs and imaging studies:  Labs: Labs show the following:   Basic Metabolic  Panel: Recent Labs  Lab 01/21/21 1024 01/22/21 0618  NA 141 139  K 4.1 4.2  CL  108 107  CO2 11* 22  GLUCOSE 175* 115*  BUN 42* 51*  CREATININE 2.50* 2.35*  CALCIUM 8.7* 8.2*   GFR Estimated Creatinine Clearance: 19.4 mL/min (A) (by C-G formula based on SCr of 2.35 mg/dL (H)). Liver Function Tests: Recent Labs  Lab 01/21/21 1024  AST 67*  ALT 35  ALKPHOS 51  BILITOT 0.8  PROT 5.6*  ALBUMIN 2.7*   No results for input(s): LIPASE, AMYLASE in the last 168 hours. No results for input(s): AMMONIA in the last 168 hours. Coagulation profile Recent Labs  Lab 01/21/21 1024 01/22/21 0618  INR 1.6* 1.4*    CBC: Recent Labs  Lab 01/21/21 1024 01/22/21 1246  WBC 15.6* 18.0*  NEUTROABS 13.6* 16.2*  HGB 6.0* 5.1*  HCT 20.7* 15.8*  MCV 93.2 85.4  PLT 89* 120*   Cardiac Enzymes: No results for input(s): CKTOTAL, CKMB, CKMBINDEX, TROPONINI in the last 168 hours. BNP (last 3 results) No results for input(s): PROBNP in the last 8760 hours. CBG: No results for input(s): GLUCAP in the last 168 hours. D-Dimer: No results for input(s): DDIMER in the last 72 hours. Hgb A1c: No results for input(s): HGBA1C in the last 72 hours. Lipid Profile: No results for input(s): CHOL, HDL, LDLCALC, TRIG, CHOLHDL, LDLDIRECT in the last 72 hours. Thyroid function studies: No results for input(s): TSH, T4TOTAL, T3FREE, THYROIDAB in the last 72 hours.  Invalid input(s): FREET3 Anemia work up: Recent Labs    01/21/21 1024  TIBC 175*  IRON 40*   Sepsis Labs: Recent Labs  Lab 01/21/21 1024 01/21/21 2150 01/22/21 0618 01/22/21 1246  PROCALCITON  --   --  3.62  --   WBC 15.6*  --   --  18.0*  LATICACIDVEN >11.0*  >11.0* 3.2*  --  2.3*    Microbiology Recent Results (from the past 240 hour(s))  Blood culture (routine single)     Status: None (Preliminary result)   Collection Time: 01/21/21 10:55 AM   Specimen: BLOOD RIGHT FOREARM  Result Value Ref Range Status   Specimen  Description BLOOD RIGHT FOREARM  Final   Special Requests   Final    BOTTLES DRAWN AEROBIC AND ANAEROBIC Blood Culture adequate volume   Culture   Final    NO GROWTH < 24 HOURS Performed at [facility], [address] [place], [place] [address]    Report Status PENDING  Incomplete  Urine culture     Status: None   Collection Time: 01/21/21 10:55 AM   Specimen: In/Out Cath Urine  Result Value Ref Range Status   Specimen Description   Final    IN/OUT CATH URINE Performed at [facility], [address] [place], [place] [address]    Special Requests   Final    NONE Performed at [facility], [address], [place], [place] [address]    Culture   Final    NO GROWTH Performed at [facility], [place] [address], [place], [place] [address]    Report Status 01/22/2021 FINAL  Final  Blood culture (single)     Status: None (Preliminary result)   Collection Time: 01/21/21 10:55 AM   Specimen: BLOOD  Result Value Ref Range Status   Specimen Description BLOOD LEFT AC  Final   Special Requests   Final    BOTTLES DRAWN AEROBIC AND ANAEROBIC Blood Culture adequate volume   Culture   Final    NO GROWTH < 24 HOURS Performed at [facility],  [place],  [address]    Report Status PENDING  Incomplete    Procedures and diagnostic studies:  CT ABDOMEN PELVIS WO CONTRAST  Result Date: 01/21/2021 CLINICAL DATA:  Nonlocalized abdominal pain EXAM: CT ABDOMEN AND PELVIS WITHOUT CONTRAST TECHNIQUE: Multidetector CT imaging of the abdomen and pelvis was performed following the standard protocol without IV contrast. COMPARISON:  CT pelvis 10/03/2018, CT angio abdomen and pelvis 07/14/2016, chest radiograph 01/21/2021 FINDINGS: Lower chest: Subpleural reticular changes are bronchitic features in the lung bases which are similar to comparison exam and may reflect some underlying interstitial lung disease. No focal airspace disease or  pleural effusion. Few scattered tiny sub 2 mm micro nodules are present in both lung bases. Cardiac size is top normal. Calcification of the mitral annulus and chordae tendinae a as well as the aortic leaflets. Hypoattenuation of the cardiac blood pool compatible with anemia. Minimal pericardial thickening towards the apex, nonspecific though could correlate for history of prior infarct or pericardial infection/inflammation. Terminus of pacer/defibrillator leads noted at the right atrium and cardiac apex. Hepatobiliary: No visible focal liver lesion within limitations of this unenhanced exam. Smooth liver surface contour. Prominent fold in the otherwise unremarkable gallbladder. No visible calcified gallstones or biliary ductal dilatation. Pancreas: No pancreatic ductal dilatation or surrounding inflammatory changes. Spleen: Normal in size. No concerning splenic lesions. Adrenals/Urinary Tract: Lobular thickening of the adrenal glands without concerning dominant nodule is similar to comparison studies. Mild bilateral renal atrophy. Multiple cortical cysts are present in both kidneys. Slightly more indeterminate hyperdense 7 mm partially exophytic cystic lesion is seen arising from the lower pole left kidney (2/27). No urolithiasis or hydronephrosis. Urinary bladder is largely decompressed at the time of exam and therefore poorly evaluated by CT imaging. No gross abnormality is clearly evident. Stomach/Bowel: Small sliding-type hiatal hernia. Stomach distended with air and ingested material. Duodenum with a normal sweep across the midline abdomen. Small fluid-filled duodenal diverticulum noted near the second portion of the duodenum (2/24) without adjacent inflammation. Much of the small bowel and colon are diffusely fluid-filled. Some this slight hazy stranding is noted around several of the bowel loops most prominently the terminal ileum and distal rectosigmoid. No evidence of bowel obstruction. Appendix is not  visualized. No focal inflammation the vicinity of the cecum to suggest an occult appendicitis. Vascular/Lymphatic: Atherosclerotic calcifications within the abdominal aorta and branch vessels. No aneurysm or ectasia. Redemonstration of an infrarenal abdominal aortic aneurysm now measuring up to 3 cm in size, minimally increased from prior. A right common iliac artery aneurysm measuring up to 2.8 cm in size is similar to prior. No enlarged abdominopelvic lymph nodes. Reproductive: Surgical changes in the deep pelvis likely reflect prior prostatectomy. Likely trace bilateral hydrocele. No other acute abnormality included external genitalia. Other: Diffuse circumferential body wall edema. Mild edematous changes in the central mesentery as well. No abdominopelvic free air or fluid. No bowel containing hernias. Musculoskeletal: The osseous structures appear diffusely demineralized which may limit detection of small or nondisplaced fractures. Multilevel degenerative changes are present in the imaged portions of the spine. Interspinous arthrosis suggest Baastrup's disease noted as well. Fairly significant disc bulging and protrusions with ligamentum flavum infolding result in some moderate to severe canal stenosis and foraminal narrowing L3-S1. IMPRESSION: 1. Much of the small bowel and colon are diffusely fluid-filled. Findings are nonspecific, but can be seen with an enterocolitis of infectious or inflammatory etiology. No evidence of bowel obstruction. 2. Minimal interval increase in size of an  infrarenal abdominal aortic aneurysm now measuring up to 3 cm in size. Stable right common iliac artery aneurysm measuring up to 2.8 cm in size. Recommend follow-up ultrasound every 3 years. This recommendation follows ACR consensus guidelines: White Paper of the ACR Incidental Findings Committee II on Vascular Findings. J Am Coll Radiol 2013; 10:789-794. 3. Indeterminate hyperdense 7 mm partially exophytic cystic lesion arising  from the lower pole left kidney. Recommend further evaluation with nonemergent renal ultrasound. 4. Subpleural reticular changes in the lung bases are similar to comparison exam and may reflect some underlying interstitial lung disease. Additional scattered basilar micro nodules. No follow-up needed if patient is low-risk (and has no known or suspected primary neoplasm). Non-contrast chest CT can be considered in 12 months if patient is high-risk. This recommendation follows the consensus statement: Guidelines for Management of Incidental Pulmonary Nodules Detected on CT Images: From the Fleischner Society 2017; Radiology 2017; 284:228-243. 5. Hypoattenuation of the cardiac blood pool compatible with anemia. 6. Minimal pericardial thickening towards the apex, nonspecific though could correlate for history of prior infarct or pericardial infection/inflammation. 7. Multilevel degenerative changes of the imaged spine with moderate to severe canal stenosis and foraminal narrowing L3-S1. 8. Aortic Atherosclerosis (ICD10-I70.0). Electronically Signed   By: Lovena Le M.D.   On: 01/21/2021 17:12   CT Head Wo Contrast  Result Date: 01/21/2021 CLINICAL DATA:  Fall, altered mental status, dual anti-platelet therapy EXAM: CT HEAD WITHOUT CONTRAST TECHNIQUE: Contiguous axial images were obtained from the base of the skull through the vertex without intravenous contrast. COMPARISON:  04/01/2018 FINDINGS: Brain: Normal anatomic configuration. Parenchymal volume loss is commensurate with the patient's age. Mild periventricular white matter changes are present likely reflecting the sequela of small vessel ischemia. No abnormal intra or extra-axial mass lesion or fluid collection. No abnormal mass effect or midline shift. No evidence of acute intracranial hemorrhage or infarct. Ventricular size is mildly enlarged, commensurate with the degree of cerebral atrophy. Cerebellum unremarkable. Vascular: No asymmetric hyperdense  vasculature at the skull base. Extensive intracranial atherosclerotic calcification is seen within the internal carotid arteries. Skull: Intact Sinuses/Orbits: Paranasal sinuses are clear. Orbits are unremarkable. Other: Mastoid air cells and middle ear cavities are clear. IMPRESSION: No acute intracranial abnormality.  No calvarial fracture. Mild to moderate senescent change. Electronically Signed   By: Fidela Salisbury MD   On: 01/21/2021 11:41   US Venous Img Lower Unilateral Left (DVT)  Result Date: 01/21/2021 CLINICAL DATA:  Left lower extremity pain and edema. History left popliteal artery aneurysm and prior embolization distal native left SFA and prior left femoral to peroneal artery bypass. EXAM: LEFT LOWER EXTREMITY VENOUS DOPPLER ULTRASOUND TECHNIQUE: Gray-scale sonography with graded compression, as well as color Doppler and duplex ultrasound were performed to evaluate the lower extremity deep venous systems from the level of the common femoral vein and including the common femoral, femoral, profunda femoral, popliteal and calf veins including the posterior tibial, peroneal and gastrocnemius veins when visible. The superficial great saphenous vein was also interrogated. Spectral Doppler was utilized to evaluate flow at rest and with distal augmentation maneuvers in the common femoral, femoral and popliteal veins. COMPARISON:  None. FINDINGS: Contralateral Common Femoral Vein: Respiratory phasicity is normal and symmetric with the symptomatic side. No evidence of thrombus. Normal compressibility. Common Femoral Vein: No evidence of thrombus. Normal compressibility, respiratory phasicity and response to augmentation. Saphenofemoral Junction: No evidence of thrombus. Normal compressibility and flow on color Doppler imaging. Profunda Femoral Vein: No evidence of thrombus. Normal  compressibility and flow on color Doppler imaging. Femoral Vein: No evidence of thrombus. Normal compressibility, respiratory  phasicity and response to augmentation. Popliteal Vein: The popliteal vein was not adequately visualized due to overlying hypoechoic structure that does not contain blood flow. This presumably relates to thrombus in a thrombosed popliteal artery aneurysm. Mass effect of this thrombosed aneurysm on the popliteal vein cannot be excluded. Calf Veins: The posterior tibial vein is visualized and patent. The peroneal vein is not visualized. Superficial Great Saphenous Vein: No evidence of thrombus. Normal compressibility. Venous Reflux:  None. Other Findings:  No evidence of superficial thrombophlebitis. IMPRESSION: 1. No visualized left lower extremity deep vein thrombosis. 2. 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 Mass effect of this thrombosed aneurysm on the popliteal vein cannot be excluded. Electronically Signed   By: Aletta Edouard M.D.   On: 01/21/2021 16:51   DG Chest Port 1 View  Result Date: 01/21/2021 CLINICAL DATA:  Altered mental status. EXAM: PORTABLE CHEST 1 VIEW COMPARISON:  04/03/2018 FINDINGS: Stable heart size and positioning of biventricular pacemaker. There is no evidence of pulmonary edema, consolidation, pneumothorax, nodule or pleural fluid. Visualized bony structures are unremarkable. IMPRESSION: No active disease. Electronically Signed   By: Aletta Edouard M.D.   On: 01/21/2021 11:07               LOS: 1 day   Ernestina Joe  [facility]   Pager on [email or web address]. If 7PM-7AM, please contact night-coverage at [email or web address]     01/22/2021, 3:40 PM

## 2021-01-22 NOTE — Consult Note (Signed)
Pharmacy Antibiotic Note  Devin Washington is a 87 y.o. male admitted on 01/21/2021 due with AMS/decreased alertness and fall, with no injuries. Pt was hypothermic and hypotensive upon admission. WBC elevated at 15.6. PMH significant for pacemaker insertion Pharmacy has been consulted for Cefepime and Vancomycin dosing for sepsis. Pt elevated Scr of 2.5 upon admission, previous levels around 1.9. Scr is trending down.   Plan: Continue Cefepime 2g IV q24h  Pt was planned to get vancomycin loading dose of 1500 mg x 1 but a pharmacist verified another vancomycin 1000 mg dose for 2/7 PM. Pt received 2500 mg total. Due to the unstable renal function will dose vancomycin by levels. Vancomycin random 19 (~24 hour level post first loading dose). Level is on the upper limit of goal. Will obtain a vancomycin random level at 12:00 12/9. Will follow up with Scr.   Continue flagyl 500 mg q8H.    Height: 5\' 10"  (177.8 cm) Weight: 78.4 kg (172 lb 14.4 oz) IBW/kg (Calculated) : 73  Temp (24hrs), Avg:98.2 F (36.8 C), Min:97.6 F (36.4 C), Max:98.5 F (36.9 C)  Recent Labs  Lab 01/21/21 1024 01/21/21 2150 01/22/21 0618 01/22/21 1246  WBC 15.6*  --   --  18.0*  CREATININE 2.50*  --  2.35*  --   LATICACIDVEN >11.0*  >11.0* 3.2*  --  2.3*  VANCORANDOM  --   --   --  19    Estimated Creatinine Clearance: 19.4 mL/min (A) (by C-G formula based on SCr of 2.35 mg/dL (H)).    No Known Allergies  Antimicrobials this admission: 01/21/21 cefepime >> 01/21/21 metronidazole >> 01/21/21 vancomycin >>  Dose adjustments this admission: NA   Microbiology results: 2/7 BCx: pending 2/7 UCx: pending   Thank you for allowing pharmacy to be a part of this patient's care.  Oswald Hillock, PharmD  01/22/2021 2:03 PM

## 2021-01-22 NOTE — Consult Note (Signed)
Pharmacy Antibiotic Note  Devin Washington is a 87 y.o. male admitted on 01/21/2021 due with AMS/decreased alertness and fall, with no injuries. Pt was hypothermic and hypotensive upon admission. WBC elevated at 15.6. PMH significant for pacemaker insertion Pharmacy has been consulted for Cefepime and Vancomycin dosing for sepsis. Pt elevated Scr of 2.5 upon admission, previous levels around 1.9. Scr is trending down.   Plan: Continue Cefepime 2g IV q24h  Pt was planned to get vancomycin loading dose of 1500 mg x 1 but a pharmacist verified another vancomycin 1000 mg dose for 2/7 PM. Pt received 2500 mg total. Due to the unstable renal function will dose vancomycin by levels.   Continue flagyl 500 mg q8H.    Height: 5\' 10"  (177.8 cm) Weight: 78.4 kg (172 lb 14.4 oz) IBW/kg (Calculated) : 73  Temp (24hrs), Avg:96.8 F (36 C), Min:92 F (33.3 C), Max:98.5 F (36.9 C)  Recent Labs  Lab 01/21/21 1024 01/21/21 2150 01/22/21 0618  WBC 15.6*  --   --   CREATININE 2.50*  --  2.35*  LATICACIDVEN >11.0*  >11.0* 3.2*  --     Estimated Creatinine Clearance: 19.4 mL/min (A) (by C-G formula based on SCr of 2.35 mg/dL (H)).    No Known Allergies  Antimicrobials this admission: 01/21/21 cefepime >> 01/21/21 metronidazole >> 01/21/21 vancomycin >>  Dose adjustments this admission: NA   Microbiology results: 2/7 BCx: pending 2/7 UCx: pending   Thank you for allowing pharmacy to be a part of this patient's care.  Oswald Hillock, PharmD  01/22/2021 8:45 AM

## 2021-01-23 DIAGNOSIS — I739 Peripheral vascular disease, unspecified: Secondary | ICD-10-CM | POA: Diagnosis not present

## 2021-01-23 DIAGNOSIS — R6520 Severe sepsis without septic shock: Secondary | ICD-10-CM | POA: Diagnosis not present

## 2021-01-23 DIAGNOSIS — D649 Anemia, unspecified: Secondary | ICD-10-CM | POA: Diagnosis not present

## 2021-01-23 DIAGNOSIS — A419 Sepsis, unspecified organism: Secondary | ICD-10-CM | POA: Diagnosis not present

## 2021-01-23 LAB — BASIC METABOLIC PANEL
Anion gap: 11 (ref 5–15)
BUN: 49 mg/dL — ABNORMAL HIGH (ref 8–23)
CO2: 20 mmol/L — ABNORMAL LOW (ref 22–32)
Calcium: 8.3 mg/dL — ABNORMAL LOW (ref 8.9–10.3)
Chloride: 109 mmol/L (ref 98–111)
Creatinine, Ser: 2.42 mg/dL — ABNORMAL HIGH (ref 0.61–1.24)
GFR, Estimated: 24 mL/min — ABNORMAL LOW (ref >60)
Glucose, Bld: 107 mg/dL — ABNORMAL HIGH (ref 70–99)
Potassium: 3.7 mmol/L (ref 3.5–5.1)
Sodium: 140 mmol/L (ref 135–145)

## 2021-01-23 LAB — PREPARE RBC (CROSSMATCH)

## 2021-01-23 LAB — HEMOGLOBIN AND HEMATOCRIT, BLOOD
HCT: 16.1 % — ABNORMAL LOW (ref 39.0–52.0)
Hemoglobin: 5.3 g/dL — ABNORMAL LOW (ref 13.0–17.0)

## 2021-01-23 MED ORDER — FUROSEMIDE 20 MG PO TABS
20.0000 mg | ORAL_TABLET | Freq: Every day | ORAL | Status: DC
  Administered 2021-01-23 – 2021-02-05 (×13): 20 mg via ORAL
  Filled 2021-01-23 (×13): qty 1

## 2021-01-23 MED ORDER — VANCOMYCIN VARIABLE DOSE PER UNSTABLE RENAL FUNCTION (PHARMACIST DOSING): Status: DC

## 2021-01-23 MED ORDER — PANTOPRAZOLE SODIUM 40 MG IV SOLR
40.0000 mg | Freq: Two times a day (BID) | INTRAVENOUS | Status: DC
  Administered 2021-01-23 – 2021-01-28 (×9): 40 mg via INTRAVENOUS
  Filled 2021-01-23 (×9): qty 40

## 2021-01-23 MED ORDER — SODIUM CHLORIDE 0.9% IV SOLUTION: Freq: Once | INTRAVENOUS | Status: DC

## 2021-01-23 MED ORDER — FUROSEMIDE 10 MG/ML IJ SOLN
20.0000 mg | Freq: Once | INTRAMUSCULAR | Status: AC
  Administered 2021-01-23: 20 mg via INTRAVENOUS
  Filled 2021-01-23: qty 2

## 2021-01-23 NOTE — Progress Notes (Signed)
PT Cancellation Note  Patient Details Name: Devin Washington MRN: [id number] DOB: 10-Feb-1923   Cancelled Treatment:    Reason Eval/Treat Not Completed: Medical issues which prohibited therapy. PT orders received and pt chart reviewed. Pt with chronic anemia and received 4 units PRBC on 01/21/21. Pt with Hgb of 5.1 on 01/22/21. Per hospital guidelines, therapy to hold intervention with Hgb < 7.0. Will follow up with therapy evaluation tomorrow as appropriate.  Herminio Commons, PT, DPT 12:26 PM,01/23/21

## 2021-01-23 NOTE — Progress Notes (Addendum)
Progress Note    Devin Washington  IOM:[id number] DOB: Nov 12, 1923  DOA: 01/21/2021 PCP: Albina Billet, MD      Brief Narrative:    Medical records reviewed and are as summarized below:  Devin Washington is a 87 y.o. male  with medical history significant for iron deficiency anemia, history of erosive esophagitis, history of peptic ulcer disease, hypertension, status post pacemaker insertion for sick sinus syndrome, peripheral vascular disease who was brought to the hospital because of altered mental status.  He lives at home alone.  He has been having dark stools at home.  He has chronic anemia and he goes to the  infusion center for periodic blood transfusions. He was hypothermic, hypoxic and hypotensive on admission.  Lactic acid was significantly elevated (greater than 11)  He was admitted to the hospital for severe sepsis.  He was also found to have severe anemia with hemoglobin of 6.   Assessment/Plan:   Principal Problem:   Severe sepsis (HCC) Active Problems:   CKD (chronic kidney disease), stage III (HCC)   Hypertension   Symptomatic anemia   CHF (congestive heart failure) ([place])   Peripheral artery disease (HCC)   Anemia in chronic kidney disease    Body mass index is 25.46 kg/m.    SIRS or severe sepsis, lactic acidosis, enterocolitis on CT abdomen and pelvis: Repeat chest x-ray on 01/22/2021 did not show any active infection.  Discontinue IV vancomycin.  Continue cefepime and Flagyl for another day.  Check stool for C. difficile toxin.  Acute on chronic anemia, iron deficiency anemia, anemia of chronic kidney disease: s/p transfusion with 1 unit of PRBCs on 01/21/2021.  Transfuse another unit of PRBCs.  Of note, he has been receiving Retacrit and blood transfusion periodically at the cancer center as an outpatient.  GI bleeding/dark stools: History of duodenal ulcer noted on EGD in April 2019 and hemorrhoids noted on colonoscopy in April 2019.  Consulted  gastroenterologist for further evaluation because of severe anemia.  AKI on CKD stage IIIb: Monitor BMP off of IV fluids.  PVD with history of bilateral popliteal and iliac artery aneurysm: Aspirin has been held.  Venous Doppler of the left lower extremity showed popliteal artery aneurysm which is a normal finding.  He sees Dr. Lucky Cowboy, vascular surgeon, as an outpatient.  S/p syncope in the emergency room 01/21/2019 while being assisted to the bedside toilet for bowel movement.  Likely vasovagal syncope    Other comorbidities include erosive esophagitis, duodenal ulcer, hemorrhoids, sick sinus syndrome status post permanent pacemaker, degenerative joint disease, hyperlipidemia, hypertension  Diet Order            Diet 2 gram sodium Room service appropriate? Yes; Fluid consistency: Thin  Diet effective now                    Consultants:  None  Procedures:  None    Medications:   . sodium chloride   Intravenous Once  . acetaminophen  325 mg Oral BID  . feeding supplement  1 Bottle Oral Q24H  . ferrous sulfate  325 mg Oral Q breakfast  . furosemide  20 mg Oral Daily  . simvastatin  20 mg Oral Q2200  . vancomycin variable dose per unstable renal function (pharmacist dosing)   Does not apply See admin instructions  . cyanocobalamin  1,000 mcg Oral Daily   Continuous Infusions: . ceFEPime (MAXIPIME) IV 2 g (01/23/21 1340)  . metronidazole 500  mg (01/23/21 0636)     Anti-infectives (From admission, onward)   Start     Dose/Rate Route Frequency Ordered Stop   01/23/21 0903  vancomycin variable dose per unstable renal function (pharmacist dosing)         Does not apply See admin instructions 01/23/21 0903     01/22/21 1200  ceFEPIme (MAXIPIME) 2 g in sodium chloride 0.9 % 100 mL IVPB        2 g 200 mL/hr over 30 Minutes Intravenous Every 24 hours 01/21/21 1649     01/21/21 1600  vancomycin (VANCOCIN) IVPB 1000 mg/200 mL premix        1,000 mg 200 mL/hr over 60 Minutes  Intravenous  Once 01/21/21 1438 01/21/21 2107   01/21/21 1500  metroNIDAZOLE (FLAGYL) IVPB 500 mg        500 mg 100 mL/hr over 60 Minutes Intravenous Every 8 hours 01/21/21 1438     01/21/21 1445  ceFEPIme (MAXIPIME) 2 g in sodium chloride 0.9 % 100 mL IVPB  Status:  Discontinued        2 g 200 mL/hr over 30 Minutes Intravenous  Once 01/21/21 1438 01/21/21 1440   01/21/21 1230  vancomycin (VANCOREADY) IVPB 500 mg/100 mL        500 mg 100 mL/hr over 60 Minutes Intravenous  Once 01/21/21 1141 01/21/21 1355   01/21/21 1130  ceFEPIme (MAXIPIME) 2 g in sodium chloride 0.9 % 100 mL IVPB        2 g 200 mL/hr over 30 Minutes Intravenous  Once 01/21/21 1129 01/21/21 1228   01/21/21 1130  vancomycin (VANCOCIN) IVPB 1000 mg/200 mL premix        1,000 mg 200 mL/hr over 60 Minutes Intravenous  Once 01/21/21 1129 01/21/21 1256             Family Communication/Anticipated D/C date and plan/Code Status   DVT prophylaxis: SCDs Start: 01/21/21 1437     Code Status: DNR  Family Communication: None Disposition Plan:    Status is: Inpatient  Remains inpatient appropriate because:IV treatments appropriate due to intensity of illness or inability to take PO   Dispo: The patient is from: Home              Anticipated d/c is to: Home              Anticipated d/c date is: 2 days              Patient currently is not medically stable to d/c.   Difficult to place patient No           Subjective:   C/o shortness of breath.  No cough or chest pain.  No abdominal pain or vomiting.  He denies any diarrhea.  However, chart review shows documentation of type 6 medium brown/black stools on 01/23/2021 at 2:29 AM and type 7 brown stools on 01/22/2021 at 5:37 PM  Objective:    Vitals:   01/23/21 0213 01/23/21 0432 01/23/21 0834 01/23/21 1210  BP:  135/71 138/68 138/71  Pulse:  (!) 102 95 92  Resp:  16 17 15   Temp:  98.5 F (36.9 C) 98.4 F (36.9 C) 99.2 F (37.3 C)  TempSrc:  Oral Oral  Oral  SpO2:  100% 100% 100%  Weight: 80.5 kg     Height:       No data found.   Intake/Output Summary (Last 24 hours) at 01/23/2021 1356 Last data filed at 01/23/2021 0100 Gross  per 24 hour  Intake 540 ml  Output 400 ml  Net 140 ml   Filed Weights   01/21/21 1023 01/22/21 0223 01/23/21 0213  Weight: 73 kg 78.4 kg 80.5 kg    Exam:  GEN: NAD SKIN: Warm and dry EYES: No pallor or icterus ENT: MMM CV: RRR PULM: CTA B ABD: soft, ND, NT, +BS CNS: AAO x 3, non focal EXT: Right leg pitting edema ( 2+), significant swelling of the left calf         Data Reviewed:   I have personally reviewed following labs and imaging studies:  Labs: Labs show the following:   Basic Metabolic Panel: Recent Labs  Lab 01/21/21 1024 01/22/21 0618 01/23/21 0615  NA 141 139 140  K 4.1 4.2 3.7  CL 108 107 109  CO2 11* 22 20*  GLUCOSE 175* 115* 107*  BUN 42* 51* 49*  CREATININE 2.50* 2.35* 2.42*  CALCIUM 8.7* 8.2* 8.3*   GFR Estimated Creatinine Clearance: 18.9 mL/min (A) (by C-G formula based on SCr of 2.42 mg/dL (H)). Liver Function Tests: Recent Labs  Lab 01/21/21 1024  AST 67*  ALT 35  ALKPHOS 51  BILITOT 0.8  PROT 5.6*  ALBUMIN 2.7*   No results for input(s): LIPASE, AMYLASE in the last 168 hours. No results for input(s): AMMONIA in the last 168 hours. Coagulation profile Recent Labs  Lab 01/21/21 1024 01/22/21 0618  INR 1.6* 1.4*    CBC: Recent Labs  Lab 01/21/21 1024 01/22/21 1246  WBC 15.6* 18.0*  NEUTROABS 13.6* 16.2*  HGB 6.0* 5.1*  HCT 20.7* 15.8*  MCV 93.2 85.4  PLT 89* 120*   Cardiac Enzymes: Recent Labs  Lab 01/22/21 0618  CKTOTAL 837*   BNP (last 3 results) No results for input(s): PROBNP in the last 8760 hours. CBG: No results for input(s): GLUCAP in the last 168 hours. D-Dimer: No results for input(s): DDIMER in the last 72 hours. Hgb A1c: No results for input(s): HGBA1C in the last 72 hours. Lipid Profile: No results for  input(s): CHOL, HDL, LDLCALC, TRIG, CHOLHDL, LDLDIRECT in the last 72 hours. Thyroid function studies: No results for input(s): TSH, T4TOTAL, T3FREE, THYROIDAB in the last 72 hours.  Invalid input(s): FREET3 Anemia work up: Recent Labs    01/21/21 1024  TIBC 175*  IRON 40*   Sepsis Labs: Recent Labs  Lab 01/21/21 1024 01/21/21 2150 01/22/21 0618 01/22/21 1246  PROCALCITON  --   --  3.62  --   WBC 15.6*  --   --  18.0*  LATICACIDVEN >11.0*  >11.0* 3.2*  --  2.3*    Microbiology Recent Results (from the past 240 hour(s))  Blood culture (routine single)     Status: None (Preliminary result)   Collection Time: 01/21/21 10:55 AM   Specimen: BLOOD RIGHT FOREARM  Result Value Ref Range Status   Specimen Description BLOOD RIGHT FOREARM  Final   Special Requests   Final    BOTTLES DRAWN AEROBIC AND ANAEROBIC Blood Culture adequate volume   Culture   Final    NO GROWTH 2 DAYS Performed at [facility], [address], [place], [place] [address]    Report Status PENDING  Incomplete  Urine culture     Status: None   Collection Time: 01/21/21 10:55 AM   Specimen: In/Out Cath Urine  Result Value Ref Range Status   Specimen Description   Final    IN/OUT CATH URINE Performed at [facility], [address], [place], [place] [address]    Special Requests   Final    NONE Performed at [facility], [address] [place], [place] [address]    Culture   Final    NO GROWTH Performed at [facility], [place] [address] [place], [place] [address]    Report Status 01/22/2021 FINAL  Final  Blood culture (single)     Status: None (Preliminary result)   Collection Time: 01/21/21 10:55 AM   Specimen: BLOOD  Result Value Ref Range Status   Specimen Description BLOOD LEFT AC  Final   Special Requests   Final    BOTTLES DRAWN AEROBIC AND ANAEROBIC Blood Culture adequate volume   Culture   Final    NO GROWTH 2 DAYS Performed at [facility],  [address] [place], [place] [address]    Report Status PENDING  Incomplete    Procedures and diagnostic studies:  CT ABDOMEN PELVIS WO CONTRAST  Result Date: 01/21/2021 CLINICAL DATA:  Nonlocalized abdominal pain EXAM: CT ABDOMEN AND PELVIS WITHOUT CONTRAST TECHNIQUE: Multidetector CT imaging of the abdomen and pelvis was performed following the standard protocol without IV contrast. COMPARISON:  CT pelvis 10/03/2018, CT angio abdomen and pelvis 07/14/2016, chest radiograph 01/21/2021 FINDINGS: Lower chest: Subpleural reticular changes are bronchitic features in the lung bases which are similar to comparison exam and may reflect some underlying interstitial lung disease. No focal airspace disease or pleural effusion. Few scattered tiny sub 2 mm micro nodules are present in both lung bases. Cardiac size is top normal. Calcification of the mitral annulus and chordae tendinae a as well as the aortic leaflets. Hypoattenuation of the cardiac blood pool compatible with anemia. Minimal pericardial thickening towards the apex, nonspecific though could correlate for history of prior infarct or pericardial infection/inflammation. Terminus of pacer/defibrillator leads noted at the right atrium and cardiac apex. Hepatobiliary: No visible focal liver lesion within limitations of this unenhanced exam. Smooth liver surface contour. Prominent fold in the otherwise unremarkable gallbladder. No visible calcified gallstones or biliary ductal dilatation. Pancreas: No pancreatic ductal dilatation or surrounding inflammatory changes. Spleen: Normal in size. No concerning splenic lesions. Adrenals/Urinary Tract: Lobular thickening of the adrenal glands without concerning dominant nodule is similar to comparison studies. Mild bilateral renal atrophy. Multiple cortical cysts are present in both kidneys. Slightly more indeterminate hyperdense 7 mm partially exophytic cystic lesion is seen arising from the lower pole left kidney  (2/27). No urolithiasis or hydronephrosis. Urinary bladder is largely decompressed at the time of exam and therefore poorly evaluated by CT imaging. No gross abnormality is clearly evident. Stomach/Bowel: Small sliding-type hiatal hernia. Stomach distended with air and ingested material. Duodenum with a normal sweep across the midline abdomen. Small fluid-filled duodenal diverticulum noted near the second portion of the duodenum (2/24) without adjacent inflammation. Much of the small bowel and colon are diffusely fluid-filled. Some this slight hazy stranding is noted around several of the bowel loops most prominently the terminal ileum and distal rectosigmoid. No evidence of bowel obstruction. Appendix is not visualized. No focal inflammation the vicinity of the cecum to suggest an occult appendicitis. Vascular/Lymphatic: Atherosclerotic calcifications within the abdominal aorta and branch vessels. No aneurysm or ectasia. Redemonstration of an infrarenal abdominal aortic aneurysm now measuring up to 3 cm in size, minimally increased from prior. A right common iliac artery aneurysm measuring up to 2.8 cm in size is similar to prior. No enlarged abdominopelvic lymph nodes. Reproductive: Surgical changes in the deep pelvis  likely reflect prior prostatectomy. Likely trace bilateral hydrocele. No other acute abnormality included external genitalia. Other: Diffuse circumferential body wall edema. Mild edematous changes in the central mesentery as well. No abdominopelvic free air or fluid. No bowel containing hernias. Musculoskeletal: The osseous structures appear diffusely demineralized which may limit detection of small or nondisplaced fractures. Multilevel degenerative changes are present in the imaged portions of the spine. Interspinous arthrosis suggest Baastrup's disease noted as well. Fairly significant disc bulging and protrusions with ligamentum flavum infolding result in some moderate to severe canal stenosis and  foraminal narrowing L3-S1. IMPRESSION: 1. Much of the small bowel and colon are diffusely fluid-filled. Findings are nonspecific, but can be seen with an enterocolitis of infectious or inflammatory etiology. No evidence of bowel obstruction. 2. Minimal interval increase in size of an infrarenal abdominal aortic aneurysm now measuring up to 3 cm in size. Stable right common iliac artery aneurysm measuring up to 2.8 cm in size. Recommend follow-up ultrasound every 3 years. This recommendation follows ACR consensus guidelines: White Paper of the ACR Incidental Findings Committee II on Vascular Findings. J Am Coll Radiol 2013; 10:789-794. 3. Indeterminate hyperdense 7 mm partially exophytic cystic lesion arising from the lower pole left kidney. Recommend further evaluation with nonemergent renal ultrasound. 4. Subpleural reticular changes in the lung bases are similar to comparison exam and may reflect some underlying interstitial lung disease. Additional scattered basilar micro nodules. No follow-up needed if patient is low-risk (and has no known or suspected primary neoplasm). Non-contrast chest CT can be considered in 12 months if patient is high-risk. This recommendation follows the consensus statement: Guidelines for Management of Incidental Pulmonary Nodules Detected on CT Images: From the Fleischner Society 2017; Radiology 2017; 284:228-243. 5. Hypoattenuation of the cardiac blood pool compatible with anemia. 6. Minimal pericardial thickening towards the apex, nonspecific though could correlate for history of prior infarct or pericardial infection/inflammation. 7. Multilevel degenerative changes of the imaged spine with moderate to severe canal stenosis and foraminal narrowing L3-S1. 8. Aortic Atherosclerosis (ICD10-I70.0). Electronically Signed   By: Lovena Le M.D.   On: 01/21/2021 17:12   US Venous Img Lower Unilateral Left (DVT)  Result Date: 01/21/2021 CLINICAL DATA:  Left lower extremity pain and  edema. History left popliteal artery aneurysm and prior embolization distal native left SFA and prior left femoral to peroneal artery bypass. EXAM: LEFT LOWER EXTREMITY VENOUS DOPPLER ULTRASOUND TECHNIQUE: Gray-scale sonography with graded compression, as well as color Doppler and duplex ultrasound were performed to evaluate the lower extremity deep venous systems from the level of the common femoral vein and including the common femoral, femoral, profunda femoral, popliteal and calf veins including the posterior tibial, peroneal and gastrocnemius veins when visible. The superficial great saphenous vein was also interrogated. Spectral Doppler was utilized to evaluate flow at rest and with distal augmentation maneuvers in the common femoral, femoral and popliteal veins. COMPARISON:  None. FINDINGS: Contralateral Common Femoral Vein: Respiratory phasicity is normal and symmetric with the symptomatic side. No evidence of thrombus. Normal compressibility. Common Femoral Vein: No evidence of thrombus. Normal compressibility, respiratory phasicity and response to augmentation. Saphenofemoral Junction: No evidence of thrombus. Normal compressibility and flow on color Doppler imaging. Profunda Femoral Vein: No evidence of thrombus. Normal compressibility and flow on color Doppler imaging. Femoral Vein: No evidence of thrombus. Normal compressibility, respiratory phasicity and response to augmentation. Popliteal Vein: The popliteal vein was not adequately visualized due to overlying hypoechoic structure that does not contain blood flow. This presumably relates  to thrombus in a thrombosed popliteal artery aneurysm. Mass effect of this thrombosed aneurysm on the popliteal vein cannot be excluded. Calf Veins: The posterior tibial vein is visualized and patent. The peroneal vein is not visualized. Superficial Great Saphenous Vein: No evidence of thrombus. Normal compressibility. Venous Reflux:  None. Other Findings:  No evidence  of superficial thrombophlebitis. IMPRESSION: 1. No visualized left lower extremity deep vein thrombosis. 2. 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 Mass effect of this thrombosed aneurysm on the popliteal vein cannot be excluded. Electronically Signed   By: Aletta Edouard M.D.   On: 01/21/2021 16:51   DG Chest Port 1 View  Result Date: 01/22/2021 CLINICAL DATA:  Hypoxia EXAM: PORTABLE CHEST 1 VIEW COMPARISON:  01/21/2021 FINDINGS: Cardiac shadow is stable. Pacing device is again seen. Aortic calcifications are noted. Lungs are well aerated bilaterally. No focal infiltrate or effusion is seen. No acute bony abnormality is noted. IMPRESSION: No active disease. Electronically Signed   By: Inez Catalina M.D.   On: 01/22/2021 16:18               LOS: 2 days   Aryona Sill  [facility]   Pager on [email or web address]. If 7PM-7AM, please contact night-coverage at [email or web address]     01/23/2021, 1:56 PM

## 2021-01-23 NOTE — Consult Note (Signed)
GI Inpatient Consult Note  Reason for Consult: Acute blood loss anemia, melena   Attending Requesting Consult: Dr. Jennye Boroughs, MD  History of Present Illness: Devin Washington is a 87 y.o. male seen for evaluation of acute blood loss anemia, melena at the request of Dr. Mal Misty. Pt has a PMH of iron-deficiency anemia, hx of erosive esophagitis, Hx of peptic ulcer disease, HTN, s/p pacemaker in situ 2/2 SSS, paroxsymal atrial fibrillation, PVD, and Hx of prostate cancer. He presented to the [facility] ED via EMS on 02/07 for evaluation of altered mental status. Pt lives at home alone and ambulates with cane and rolling walker. He was brought in to the hospital via EMS and was found to have acute blood loss anemia with hemoglobin 6.0, was 7.8 three weeks ago. Per ED physician, hemoccult was negative with brown stool in his rectal vault. He was found to be hypothermic, hypoxic, and hypotensive on admission meeting sepsis protocol. He was started on IV fluids per protocol and broad spectrum antibiotics. He was transfused 1 unit pRBCs on 01/21/21. Patient reports he noticed dark stools starting over the weekend which looked like blood to him. He does have history of peptic ulcer disease 2/2 H pylori in 2016 with multiple gastric ulcers and duodenal ulcers. He was also admitted 03/2018 for acute blood loss anemia where he was found to have multiple non-obstructing non-bleeding duodenal ulcers with a clean ulcer base (Forrest Class III). He follows as an outpatient with Dr. Janese Banks (last seen one month ago) for anemia of chronic disease and gets Retacrit as needed. Baseline hemoglobin has been 7.8-9. CT abd/pelvis wo contrast showed fluid-filled small bowel and colon consistent with enterocolitis and other minor changes. He reportedly had syncopal episode in the ED while trying to use the commode. Hemoglobin today was 5.3 and transfusion is planned for this afternoon. He has not had a bowel movement today. Last BM was  yesterday per patient. He denies any complaints of nausea, vomiting, loss of appetite, dysphagia, refractory reflux, or epigastric abdominal pain. He denies any frequent NSAID use outside of 81 mg aspirin.    Summary of GI Procedures: EGD 03/2018 - Multiple non-obstructing non-bleeding duodenal ulcers with a clean ulcer base (Forrest Class III). Suspicious for H. pylori induced etiology. - Normal second portion of the duodenum. - Non-bleeding erosive gastropathy. - Small hiatal hernia. - Normal cardia, gastric fundus and gastric body. Biopsied. - Normal gastroesophageal junction and esophagus.  CSY 03/2018 - Hemorrhoids found on perianal exam. - One 7 mm polyp in the cecum, removed with a hot snare. Resected and retrieved. Clip was placed. - Non-bleeding internal hemorrhoids. - The examination was otherwise normal  DIAGNOSIS:  A. STOMACH, RANDOM; COLD BIOPSY:  - MODERATE CHRONIC GASTRITIS.  - IHC FOR H. PYLORI STAIN IS NEGATIVE, SEE COMMENT.  - NEGATIVE FOR DYSPLASIA AND MALIGNANCY.   B. CECUM POLYP; HOT SNARE:  - TUBULAR ADENOMA.  - NEGATIVE FOR HIGH-GRADE DYSPLASIA AND MALIGNANCY.   Past Medical History:  Past Medical History:  Diagnosis Date  . A-fib ([place])   . Anemia   . Cardiomyopathy ([place])   . CHF (congestive heart failure) ([place])   . DJD (degenerative joint disease)   . Duodenal ulcer 08/09/2015  . Dysrhythmia   . Erosive esophagitis   . HH (hiatus hernia)   . HLD (hyperlipidemia)   . Hypertension   . Pacemaker   . PAD (peripheral artery disease) ([place])   . Peripheral vascular disease ([place])   . Presence  of permanent cardiac pacemaker   . Prostate cancer [facility])    Prostatectomy.  . 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 Shortness of breath   . SSS (sick sinus syndrome) ([place])     Problem List: Patient Active Problem List   Diagnosis Date Noted  . Severe sepsis ([place]) 01/21/2021  . Swelling of limb 07/03/2020  . Peripheral artery disease ([place]) 01/09/2020  . Benign  hypertensive kidney disease with chronic kidney disease 12/21/2019  . Proteinuria 12/21/2019  . Secondary hyperparathyroidism of renal origin ([place]) 12/21/2019  . Anemia in chronic kidney disease 12/21/2019  . Atrial fibrillation ([place]) 08/24/2019  . Bradycardia 08/24/2019  . Cardiomyopathy ([place]) 08/24/2019  . CHF (congestive heart failure) ([place]) 08/24/2019  . Sick sinus syndrome ([place]) 08/24/2019  . SOB (shortness of breath) 08/24/2019  . Age-related osteoporosis with current pathological fracture with routine healing 10/08/2018  . Wheelchair bound 10/08/2018  . Pubic ramus fracture ([place]) 10/03/2018  . Acute blood loss anemia 05/28/2018  . Iron deficiency anemia 04/09/2018  . Symptomatic anemia 04/02/2018  . 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 Hypertension 11/13/2017  . 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 Protein-calorie malnutrition, severe ([place]) 08/08/2015  . Abdominal pain, epigastric 08/06/2015  . Nausea and vomiting 08/06/2015  . HTN (hypertension) 08/06/2015  . Hypercholesteremia 08/06/2015  . Back pain 08/06/2015  . CKD (chronic kidney disease), stage III ([place]) 08/06/2015  . Abdominal pain 08/06/2015    Past Surgical History: Past Surgical History:  Procedure Laterality Date  . CATARACT EXTRACTION, BILATERAL    . COLONOSCOPY N/A 04/04/2018   Procedure: COLONOSCOPY;  Surgeon: Lin Landsman, MD;  Location: [facility] ENDOSCOPY;  Service: Gastroenterology;  Laterality: N/A;  . ESOPHAGOGASTRODUODENOSCOPY N/A 08/09/2015   Procedure: ESOPHAGOGASTRODUODENOSCOPY (EGD) looking in the stomach with a lighted tube to evaluate and treat;  Surgeon: Lollie Sails, MD;  Location: [facility] ENDOSCOPY;  Service: Endoscopy;  Laterality: N/A;  Procedure to be done tomorrow afternoon, 08/08/2015. Awaiting cardiology consult  . ESOPHAGOGASTRODUODENOSCOPY N/A 04/04/2018   Procedure: ESOPHAGOGASTRODUODENOSCOPY (EGD);  Surgeon: Lin Landsman, MD;  Location: [facility]  ENDOSCOPY;  Service: Gastroenterology;  Laterality: N/A;  . EYE SURGERY    . FEMORAL-POPLITEAL BYPASS GRAFT Left 12/16/2017   Procedure: BYPASS GRAFT FEMORAL-POPLITEAL ARTERY ( OPEN POPLITEAL BYPASS );  Surgeon: Algernon Huxley, MD;  Location: [facility] ORS;  Service: Vascular;  Laterality: Left;  . FLEXIBLE SIGMOIDOSCOPY N/A 08/09/2015   Procedure: FLEXIBLE SIGMOIDOSCOPY looking up the rectum into the distal colon with a lighted tube to examine and treat;  Surgeon: Lollie Sails, MD;  Location: [facility] ENDOSCOPY;  Service: Endoscopy;  Laterality: N/A;  Procedure to be done tomorrow afternoon, 08/08/2015. Awaiting cardiology consult.  . INSERT / REPLACE / REMOVE PACEMAKER    . JOINT REPLACEMENT Left    left knee replacement  . LOWER EXTREMITY ANGIOGRAPHY Left 11/16/2017   Procedure: LOWER EXTREMITY ANGIOGRAPHY;  Surgeon: Algernon Huxley, MD;  Location: [place] CV LAB;  Service: Cardiovascular;  Laterality: Left;  . LOWER EXTREMITY ANGIOGRAPHY Right 11/30/2017   Procedure: LOWER EXTREMITY ANGIOGRAPHY;  Surgeon: Algernon Huxley, MD;  Location: [place] CV LAB;  Service: Cardiovascular;  Laterality: Right;  . LOWER EXTREMITY ANGIOGRAPHY Left 03/04/2018   Procedure: LOWER EXTREMITY ANGIOGRAPHY;  Surgeon: Algernon Huxley, MD;  Location: [place] CV LAB;  Service: Cardiovascular;  Laterality: Left;  . LOWER EXTREMITY ANGIOGRAPHY Left 04/12/2018   Procedure: LOWER EXTREMITY ANGIOGRAPHY;  Surgeon: Algernon Huxley, MD;  Location: [place] CV LAB;  Service: Cardiovascular;  Laterality: Left;  . PACEMAKER INSERTION    . PROSTATE SURGERY    . PROSTATE SURGERY      Allergies: No Known Allergies  Home Medications: Medications Prior to Admission  Medication Sig Dispense Refill Last Dose  . amLODipine (NORVASC) 10 MG tablet Take 10 mg by mouth daily.      Jearl Klinefelter ELLIPTA 62.5-25 MCG/INH AEPB Inhale 1 puff into the lungs 2 (two) times daily as needed.     Marland Kitchen aspirin 81 MG EC tablet Take by mouth.     .  feeding supplement, ENSURE ENLIVE, (ENSURE ENLIVE) LIQD Take 1 Bottle by mouth daily.     . ferrous sulfate 325 (65 FE) MG tablet Take 1 tablet (325 mg total) by mouth 2 (two) times daily with a meal. (Patient taking differently: Take 325 mg by mouth daily with breakfast.) 60 tablet 3   . furosemide (LASIX) 20 MG tablet Take 1 tablet by mouth daily.     . metoprolol tartrate (LOPRESSOR) 25 MG tablet Take 1 tablet (25 mg total) by mouth 2 (two) times daily. 60 tablet 0   . simvastatin (ZOCOR) 40 MG tablet Take 40 mg by mouth daily.     . traMADol (ULTRAM) 50 MG tablet Take 1 tablet (50 mg total) by mouth every 6 (six) hours as needed for moderate pain or severe pain. 30 tablet 0   . traZODone (DESYREL) 50 MG tablet Take 1 tablet by mouth at bedtime as needed for sleep.       Home medication reconciliation was completed with the patient.   Scheduled Inpatient Medications:   . sodium chloride   Intravenous Once  . acetaminophen  325 mg Oral BID  . feeding supplement  1 Bottle Oral Q24H  . ferrous sulfate  325 mg Oral Q breakfast  . furosemide  20 mg Oral Daily  . pantoprazole (PROTONIX) IV  40 mg Intravenous Q12H  . simvastatin  20 mg Oral Q2200  . vancomycin variable dose per unstable renal function (pharmacist dosing)   Does not apply See admin instructions  . cyanocobalamin  1,000 mcg Oral Daily    Continuous Inpatient Infusions:   . ceFEPime (MAXIPIME) IV 2 g (01/23/21 1340)  . metronidazole 500 mg (01/23/21 0636)    PRN Inpatient Medications:  acetaminophen **OR** acetaminophen, albuterol, iohexol, ondansetron **OR** ondansetron (ZOFRAN) IV, traZODone, umeclidinium-vilanterol  Family History: family history includes Lung cancer in his father.  The patient's family history is negative for inflammatory bowel disorders, GI malignancy, or solid organ transplantation.  Social History:   reports that he quit smoking about 62 years ago. He has quit using smokeless tobacco.  His  smokeless tobacco use included chew and snuff. He reports that he does not drink alcohol and does not use drugs. The patient denies ETOH, tobacco, or drug use.   Review of Systems: Constitutional: Weight is stable.  Eyes: No changes in vision. ENT: No oral lesions, sore throat.  GI: see HPI.  Heme/Lymph: No easy bruising.  CV: No chest pain.  GU: No hematuria.  Integumentary: No rashes.  Neuro: No headaches.  Psych: No depression/anxiety.  Endocrine: No heat/cold intolerance.  Allergic/Immunologic: No urticaria.  Resp: No cough, SOB.  Musculoskeletal: No joint swelling.    Physical Examination: BP (!) 158/72 (BP Location: Left Arm)   Pulse 93   Temp 99.2 F (37.3 C) (Oral)   Resp 18   Ht 5\' 10"  (1.778 m)   Wt 80.5 kg  SpO2 100%   BMI 25.46 kg/m  Gen: NAD, alert and oriented x 4 HEENT: PEERLA, EOMI, Neck: supple, no JVD or thyromegaly Chest: CTA bilaterally, no wheezes, crackles, or other adventitious sounds CV: RRR, no m/g/c/r Abd: soft, NT, ND, +BS in all four quadrants; no HSM, guarding, ridigity, or rebound tenderness Ext: no edema, well perfused with 2+ pulses, Skin: no rash or lesions noted Lymph: no LAD  Data: Lab Results  Component Value Date   WBC 18.0 (H) 01/22/2021   HGB 5.3 (L) 01/23/2021   HCT 16.1 (L) 01/23/2021   MCV 85.4 01/22/2021   PLT 120 (L) 01/22/2021   Recent Labs  Lab 01/21/21 1024 01/22/21 1246 01/23/21 1427  HGB 6.0* 5.1* 5.3*   Lab Results  Component Value Date   NA 140 01/23/2021   K 3.7 01/23/2021   CL 109 01/23/2021   CO2 20 (L) 01/23/2021   BUN 49 (H) 01/23/2021   CREATININE 2.42 (H) 01/23/2021   Lab Results  Component Value Date   ALT 35 01/21/2021   AST 67 (H) 01/21/2021   ALKPHOS 51 01/21/2021   BILITOT 0.8 01/21/2021   Recent Labs  Lab 01/21/21 1024 01/22/21 0618  APTT 41*  --   INR 1.6* 1.4*   Assessment/Plan:  87 y/o AA male with a PMH of PMH of anemia of chronic disease, hx of erosive esophagitis, Hx  of peptic ulcer disease, HTN, s/p pacemaker in situ 2/2 SSS, paroxsymal atrial fibrillation, PVD, and Hx of prostate cancer admitted for altered mental status found to meet sepsis protocol with acute blood loss anemia  1. Acute blood loss anemia/Melena - drop in hemoglobin from baseline 7.8-9 three weeks ago to 5.3 today. Differential includes peptic ulcer disease +/- H pylori, gastritis, AVMs, erosive esophagitis, malignancy, polyp, etc  2. Anemia of chronic disease - follows with Dr. Janese Banks as outpatient, on Retacrit   3. Hemoccult negative in ED  4. SIRS or severe sepsis - on IV fluids and antibiotics, hospitalist following.   5. Enterocolitis - seen on CT, pt not having colitis symptoms and no overt diarrhea, hematochezia, or melena  6. AKI on CKD Stage IIIb  DNR Status   Recommendations:  - Transfuse 2 units pRBCs today and monitor H&H closely. Goal Hgb >7.0. - No overt gastrointestinal blood loss or hemorrhaging - Will start IV PPI for gastric protection  - Follow closely with serial abdominal examinations  - Will discuss with family regarding goals of care - Consider EGD when clinically feasible, ideally after transfusion and hemodynamically stable - Following along with you    Thank you for the consult. Please call with questions or concerns.  Reeves Forth [facility] [phone] (Cell)

## 2021-01-24 DIAGNOSIS — A419 Sepsis, unspecified organism: Secondary | ICD-10-CM | POA: Diagnosis not present

## 2021-01-24 DIAGNOSIS — R6520 Severe sepsis without septic shock: Secondary | ICD-10-CM | POA: Diagnosis not present

## 2021-01-24 LAB — CBC WITH DIFFERENTIAL/PLATELET
Abs Immature Granulocytes: 0.21 10*3/uL — ABNORMAL HIGH (ref 0.00–0.07)
Basophils Absolute: 0 10*3/uL (ref 0.0–0.1)
Basophils Relative: 0 %
Eosinophils Absolute: 0 10*3/uL (ref 0.0–0.5)
Eosinophils Relative: 0 %
HCT: 25.3 % — ABNORMAL LOW (ref 39.0–52.0)
Hemoglobin: 8.2 g/dL — ABNORMAL LOW (ref 13.0–17.0)
Immature Granulocytes: 1 %
Lymphocytes Relative: 4 %
Lymphs Abs: 0.7 10*3/uL (ref 0.7–4.0)
MCH: 28.9 pg (ref 26.0–34.0)
MCHC: 32.4 g/dL (ref 30.0–36.0)
MCV: 89.1 fL (ref 80.0–100.0)
Monocytes Absolute: 1.1 10*3/uL — ABNORMAL HIGH (ref 0.1–1.0)
Monocytes Relative: 5 %
Neutro Abs: 18.7 10*3/uL — ABNORMAL HIGH (ref 1.7–7.7)
Neutrophils Relative %: 90 %
Platelets: 157 10*3/uL (ref 150–400)
RBC: 2.84 MIL/uL — ABNORMAL LOW (ref 4.22–5.81)
RDW: 16.3 % — ABNORMAL HIGH (ref 11.5–15.5)
WBC: 20.8 10*3/uL — ABNORMAL HIGH (ref 4.0–10.5)
nRBC: 0 % (ref 0.0–0.2)

## 2021-01-24 LAB — BASIC METABOLIC PANEL
Anion gap: 12 (ref 5–15)
BUN: 41 mg/dL — ABNORMAL HIGH (ref 8–23)
CO2: 21 mmol/L — ABNORMAL LOW (ref 22–32)
Calcium: 8.5 mg/dL — ABNORMAL LOW (ref 8.9–10.3)
Chloride: 106 mmol/L (ref 98–111)
Creatinine, Ser: 2.09 mg/dL — ABNORMAL HIGH (ref 0.61–1.24)
GFR, Estimated: 29 mL/min — ABNORMAL LOW (ref >60)
Glucose, Bld: 99 mg/dL (ref 70–99)
Potassium: 3.6 mmol/L (ref 3.5–5.1)
Sodium: 139 mmol/L (ref 135–145)

## 2021-01-24 LAB — LACTIC ACID, PLASMA: Lactic Acid, Venous: 1.9 mmol/L (ref 0.5–1.9)

## 2021-01-24 NOTE — Evaluation (Signed)
Physical Therapy Evaluation Patient Details Name: Devin Washington MRN: [id number] DOB: 08-06-1923 Today's Date: 01/24/2021   History of Present Illness  Pt is a 87 y.o. male seen for evaluation of acute blood loss anemia, melena at the request of Dr. Mal Misty. Pt has a PMH of iron-deficiency anemia, hx of erosive esophagitis, Hx of peptic ulcer disease, HTN, s/p pacemaker in situ 2/2 SSS, paroxsymal atrial fibrillation, PVD, and Hx of prostate cancer. He presented to the [facility] ED via EMS on 02/07 for evaluation of altered mental status. Pt with syncopal episode in ED, also with hemoglobin 5.3, s/p transfusions, hg 8.2.    Clinical Impression  Pt alert, in bed, agreeable to PT, denied pain, orientedx4. Pt reported at baseline his in modI for ADLs/IADLs, drives, uses a RW for ambulation, lives alone but has granddaughters that check in as well as a girlfriend who can assist.  The patient was able to move all extremities freely against gravity. BM noted, pt performed rolling(with use of bed rails supervision) and assist by PT for cleaning and linens changed. Supine to sit with CGA. Fair sitting balance noted, pt able to sit for several minutes supervision. Sit <> stand with RW and CGA from EOB and recliner during session, effortful for pt but no physical assistance required. He transferred to the recliner and complained of SOB, spO2 and HR WFLs. At pt request he used his inhaler (RN notified and agreeable) prior to further mobility. He ambulated ~46ft with RW and CGA, again SOB after mobility but no physical assist needed.  Overall the patient demonstrated deficits (see "PT Problem List") that impede the patient's functional abilities, safety, and mobility and would benefit from skilled PT intervention. Recommendation is HHPT with supervision/assistance 24/7, especially for mobility/OOB. Pt reported his girlfriend would be able to provide this.     Follow Up Recommendations Home health PT;Supervision for  mobility/OOB;Supervision/Assistance - 24 hour    Equipment Recommendations  None recommended by PT    Recommendations for Other Services       Precautions / Restrictions Precautions Precautions: Fall Restrictions Weight Bearing Restrictions: No      Mobility  Bed Mobility Overal bed mobility: Needs Assistance Bed Mobility: Rolling;Supine to Sit Rolling: Supervision   Supine to sit: Supervision;HOB elevated     General bed mobility comments: use of bed rails    Transfers Overall transfer level: Needs assistance Equipment used: Rolling walker (2 wheeled) Transfers: Sit to/from Stand Sit to Stand: Min guard         General transfer comment: effortful for patient but able to do without physical assist, heavy use of RW  Ambulation/Gait Ambulation/Gait assistance: Min guard Gait Distance (Feet): 20 Feet Assistive device: Rolling walker (2 wheeled)       General Gait Details: decreased gait velocity, flexed trunk, decreased step height/length  Stairs            Wheelchair Mobility    Modified Rankin (Stroke Patients Only)       Balance Overall balance assessment: Needs assistance Sitting-balance support: Feet supported Sitting balance-Leahy Scale: Fair       Standing balance-Leahy Scale: Fair Standing balance comment: improved safety with RW, needed for all dynamic balance/ambulation                             Pertinent Vitals/Pain Pain Assessment: No/denies pain    Home Living Family/patient expects to be discharged to:: Private residence Living Arrangements: Alone Available  Help at Discharge: Family (granddaughters, girlfriend) Type of Home: House Home Access: Stairs to enter Entrance Stairs-Rails: Chemical engineer of Steps: 2 Home Layout: One level Home Equipment: Environmental consultant - 2 wheels;Cane - single point;Bedside commode;Grab bars - tub/shower Additional Comments: 1 fall in the last 6 months.    Prior  Function Level of Independence: Independent with assistive device(s)         Comments: modI with walker, drives, has meals on wheels     Hand Dominance   Dominant Hand: Right    Extremity/Trunk Assessment   Upper Extremity Assessment Upper Extremity Assessment: Overall WFL for tasks assessed    Lower Extremity Assessment Lower Extremity Assessment: Generalized weakness    Cervical / Trunk Assessment Cervical / Trunk Assessment: Kyphotic  Communication   Communication: No difficulties  Cognition Arousal/Alertness: Awake/alert Behavior During Therapy: WFL for tasks assessed/performed Overall Cognitive Status: Within Functional Limits for tasks assessed                                        General Comments      Exercises Other Exercises Other Exercises: time spent on bed mobility to clean up patient after BM. able to roll with bed rails, and maintain sidelying. gown and linens changed as needed   Assessment/Plan    PT Assessment Patient needs continued PT services  PT Problem List Decreased strength;Decreased mobility;Decreased range of motion;Decreased activity tolerance;Decreased balance       PT Treatment Interventions DME instruction;Therapeutic exercise;Gait training;Balance training;Stair training;Neuromuscular re-education;Functional mobility training;Therapeutic activities;Patient/family education    PT Goals (Current goals can be found in the Care Plan section)  Acute Rehab PT Goals Patient Stated Goal: to get his strength back PT Goal Formulation: With patient Time For Goal Achievement: 02/07/21 Potential to Achieve Goals: Good    Frequency Min 2X/week   Barriers to discharge        Co-evaluation               AM-PAC PT "6 Clicks" Mobility  Outcome Measure Help needed turning from your back to your side while in a flat bed without using bedrails?: A Little Help needed moving from lying on your back to sitting on the  side of a flat bed without using bedrails?: A Little Help needed moving to and from a bed to a chair (including a wheelchair)?: A Little Help needed standing up from a chair using your arms (e.g., wheelchair or bedside chair)?: A Little Help needed to walk in hospital room?: A Little Help needed climbing 3-5 steps with a railing? : A Little 6 Click Score: 18    End of Session Equipment Utilized During Treatment: Gait belt Activity Tolerance: Patient tolerated treatment well Patient left: in chair;with call bell/phone within reach;with chair alarm set Nurse Communication: Mobility status PT Visit Diagnosis: Other abnormalities of gait and mobility (R26.89);Muscle weakness (generalized) (M62.81);Difficulty in walking, not elsewhere classified (R26.2);Unsteadiness on feet (R26.81)    Time: 4656-8127 PT Time Calculation (min) (ACUTE ONLY): 44 min   Charges:   PT Evaluation $PT Eval Moderate Complexity: 1 Mod PT Treatments $Therapeutic Exercise: 23-37 mins $Therapeutic Activity: 8-22 mins      Lieutenant Diego PT, DPT 1:17 PM,01/24/21

## 2021-01-24 NOTE — Progress Notes (Signed)
GI Inpatient Follow-up Note  Subjective:  Patient seen and examined this afternoon resting comfortably in bedside chair. No acute events overnight. No new complaints. No further overt melena overnight or so far today. His hemoglobin improved to 8.2 s/p transfusion last night.   Scheduled Inpatient Medications:  . sodium chloride   Intravenous Once  . acetaminophen  325 mg Oral BID  . feeding supplement  1 Bottle Oral Q24H  . ferrous sulfate  325 mg Oral Q breakfast  . furosemide  20 mg Oral Daily  . pantoprazole (PROTONIX) IV  40 mg Intravenous Q12H  . vancomycin variable dose per unstable renal function (pharmacist dosing)   Does not apply See admin instructions  . cyanocobalamin  1,000 mcg Oral Daily    Continuous Inpatient Infusions:   . ceFEPime (MAXIPIME) IV 2 g (01/24/21 1230)  . metronidazole 500 mg (01/24/21 1511)    PRN Inpatient Medications:  acetaminophen **OR** acetaminophen, albuterol, iohexol, ondansetron **OR** ondansetron (ZOFRAN) IV, traZODone, umeclidinium-vilanterol  Review of Systems: Constitutional: Weight is stable.  Eyes: No changes in vision. ENT: No oral lesions, sore throat.  GI: see HPI.  Heme/Lymph: No easy bruising.  CV: No chest pain.  GU: No hematuria.  Integumentary: No rashes.  Neuro: No headaches.  Psych: No depression/anxiety.  Endocrine: No heat/cold intolerance.  Allergic/Immunologic: No urticaria.  Resp: No cough, SOB.  Musculoskeletal: No joint swelling.    Physical Examination: BP (!) 147/72 (BP Location: Right Arm)   Pulse 91   Temp 98.1 F (36.7 C) (Oral)   Resp 18   Ht 5\' 10"  (1.778 m)   Wt 78.7 kg   SpO2 100%   BMI 24.89 kg/m  Gen: NAD, alert and oriented x 4 HEENT: PEERLA, EOMI, Neck: supple, no JVD or thyromegaly Chest: CTA bilaterally, no wheezes, crackles, or other adventitious sounds CV: RRR, no m/g/c/r Abd: soft, NT, ND, +BS in all four quadrants; no HSM, guarding, ridigity, or rebound tenderness Ext: no  edema, well perfused with 2+ pulses, Skin: no rash or lesions noted Lymph: no LAD  Data: Lab Results  Component Value Date   WBC 20.8 (H) 01/24/2021   HGB 8.2 (L) 01/24/2021   HCT 25.3 (L) 01/24/2021   MCV 89.1 01/24/2021   PLT 157 01/24/2021   Recent Labs  Lab 01/22/21 1246 01/23/21 1427 01/24/21 0536  HGB 5.1* 5.3* 8.2*   Lab Results  Component Value Date   NA 139 01/24/2021   K 3.6 01/24/2021   CL 106 01/24/2021   CO2 21 (L) 01/24/2021   BUN 41 (H) 01/24/2021   CREATININE 2.09 (H) 01/24/2021   Lab Results  Component Value Date   ALT 35 01/21/2021   AST 67 (H) 01/21/2021   ALKPHOS 51 01/21/2021   BILITOT 0.8 01/21/2021   Recent Labs  Lab 01/21/21 1024 01/22/21 0618  APTT 41*  --   INR 1.6* 1.4*   Assessment/Plan: 87 y/o AA male with a PMH of PMH of anemia of chronic disease, hx of erosive esophagitis, Hx of peptic ulcer disease, HTN, s/p pacemaker in situ 2/2 SSS, paroxsymal atrial fibrillation, PVD, and Hx of prostate cancer admitted for altered mental status found to meet sepsis protocol with acute blood loss anemia  1. Acute blood loss anemia/Melena - drop in hemoglobin from baseline 7.8-9 three weeks ago to 5.3 yesterday. He is s/p 2 units pRBCs. Hemoglobin today 8.2. Differential includes peptic ulcer disease +/- H pylori, gastritis, AVMs, erosive esophagitis, malignancy, polyp, etc  2. Anemia of  chronic disease - follows with Dr. Janese Banks as outpatient, on Retacrit   3. Hemoccult negative in ED  4. SIRS or severe sepsis - on IV fluids and antibiotics, hospitalist following.   5. Enterocolitis - seen on CT, pt not having colitis symptoms and no overt diarrhea, hematochezia, or melena  6. AKI on CKD Stage IIIb  DNR Status   Recommendations:  - H&H improved to 8.2 today s/p transfusion. Continue to monitor H&H and transfuse for hemoglobin <7.0. - No overt gastrointestinal blood loss - Continue IV PPI for gastric protection - Plan for EGD  tomorrow with Dr. Haig Prophet for further evaluation - See procedure note for findings and further recommendations  Discussed plan of care with both patient and brother Sharla Kidney) who are in agreement. Consent will need to be signed before procedure.   I reviewed the risks (including bleeding, perforation, infection, anesthesia complications, cardiac/respiratory complications), benefits and alternatives of EGD. Patient consents to proceed.    Please call with questions or concerns.    Octavia Bruckner, PA-C [facility] [phone] (Cell)

## 2021-01-24 NOTE — Progress Notes (Signed)
Patient received 2 units of blood overnight. One time dose of 20mg  of lasix given after 1st transfusion complete. IV antibiotic hung and then 2nd unit of blood hung as to not fluid overload the patient who is already edematous. No signs of a transfusion reaction. Post transfusion labs pending. Patient had 715ml of urine and 3 large unmeasured post lasix. patient able to make his needs know. Will continue with the current plan of care.

## 2021-01-24 NOTE — Progress Notes (Signed)
Progress Note    Devin Washington  NIO:[id number] DOB: [birth year]  DOA: 01/21/2021 PCP: Albina Billet, MD   Brief Narrative:   Medical records reviewed and are as summarized below:  Devin Washington is a 87 y.o. male  with medical history significant for iron deficiency anemia, history of erosive esophagitis, history of peptic ulcer disease, hypertension, status post pacemaker insertion for sick sinus syndrome, peripheral vascular disease who was brought to the hospital because of altered mental status.  He lives at home alone.  He has been having dark stools at home.  He has chronic anemia and he goes to the  infusion center for periodic blood transfusions. He was hypothermic, hypoxic and hypotensive on admission.  Lactic acid was significantly elevated (greater than 11). He was admitted to the hospital for severe sepsis.  He was also found to have severe anemia with hemoglobin of 6.   Assessment/Plan:   Principal Problem:   Severe sepsis (HCC) Active Problems:   CKD (chronic kidney disease), stage III (HCC)   Hypertension   Symptomatic anemia   CHF (congestive heart failure) (HCC)   Peripheral artery disease (HCC)   Anemia in chronic kidney disease   Severe sepsis secondary to enterocolitis, POA Noted on CT abdomen and pelvis: Repeat chest x-ray on 01/22/2021 did not show any active infection.  Discontinue IV vancomycin.  Continue cefepime and Flagyl for another day.  Check stool for C. difficile toxin.  Acute (unknown type) on chronic iron deficiency anemia/chronic anemia of chronic kidney disease:  Hemoglobin improved, currently 8.2 s/p 2 unit PRBC Of note, he has been receiving Retacrit and blood transfusion periodically at the cancer center as an outpatient indicating somewhat chronic production issue.  GI bleeding/dark stools:  Profound history of GI bleeding per GI. Appreciate insight and recommendations.  AKI on CKD stage IIIb:  Secondary to above, pre-renal azotemia due  to profound anemia.  Improving - nearly back to baseline around 2.0  PVD with history of bilateral popliteal and iliac artery aneurysm: Aspirin has been held.  Venous Doppler of the left lower extremity showed popliteal artery aneurysm which is a normal finding.  He sees Dr. Lucky Cowboy, vascular surgeon, as an outpatient.  Syncope: Noted in the emergency room 01/21/2019 while being assisted to the bedside toilet for bowel movement.  Likely vasovagal syncope  Diet Order            Diet 2 gram sodium Room service appropriate? Yes; Fluid consistency: Thin  Diet effective now                Consultants:  GI  Procedures:  None planned  Medications:   . sodium chloride   Intravenous Once  . acetaminophen  325 mg Oral BID  . feeding supplement  1 Bottle Oral Q24H  . ferrous sulfate  325 mg Oral Q breakfast  . furosemide  20 mg Oral Daily  . pantoprazole (PROTONIX) IV  40 mg Intravenous Q12H  . vancomycin variable dose per unstable renal function (pharmacist dosing)   Does not apply See admin instructions  . cyanocobalamin  1,000 mcg Oral Daily   Continuous Infusions: . ceFEPime (MAXIPIME) IV Stopped (01/23/21 1410)  . metronidazole 500 mg (01/24/21 8182)    Anti-infectives (From admission, onward)   Start     Dose/Rate Route Frequency Ordered Stop   01/23/21 0903  vancomycin variable dose per unstable renal function (pharmacist dosing)         Does not  apply See admin instructions 01/23/21 0903     01/22/21 1200  ceFEPIme (MAXIPIME) 2 g in sodium chloride 0.9 % 100 mL IVPB        2 g 200 mL/hr over 30 Minutes Intravenous Every 24 hours 01/21/21 1649     01/21/21 1600  vancomycin (VANCOCIN) IVPB 1000 mg/200 mL premix        1,000 mg 200 mL/hr over 60 Minutes Intravenous  Once 01/21/21 1438 01/21/21 2107   01/21/21 1500  metroNIDAZOLE (FLAGYL) IVPB 500 mg        500 mg 100 mL/hr over 60 Minutes Intravenous Every 8 hours 01/21/21 1438     01/21/21 1445  ceFEPIme (MAXIPIME) 2 g in  sodium chloride 0.9 % 100 mL IVPB  Status:  Discontinued        2 g 200 mL/hr over 30 Minutes Intravenous  Once 01/21/21 1438 01/21/21 1440   01/21/21 1230  vancomycin (VANCOREADY) IVPB 500 mg/100 mL        500 mg 100 mL/hr over 60 Minutes Intravenous  Once 01/21/21 1141 01/21/21 1355   01/21/21 1130  ceFEPIme (MAXIPIME) 2 g in sodium chloride 0.9 % 100 mL IVPB        2 g 200 mL/hr over 30 Minutes Intravenous  Once 01/21/21 1129 01/21/21 1228   01/21/21 1130  vancomycin (VANCOCIN) IVPB 1000 mg/200 mL premix        1,000 mg 200 mL/hr over 60 Minutes Intravenous  Once 01/21/21 1129 01/21/21 1256      Family Communication/Anticipated D/C date and plan/Code Status   DVT prophylaxis: SCDs Start: 01/21/21 1437    Code Status: DNR  Family Communication: None Disposition Plan:   Status is: Inpatient  Remains inpatient appropriate because:IV treatments appropriate due to intensity of illness or inability to take PO   Dispo: The patient is from: Home              Anticipated d/c is to: Home              Anticipated d/c date is: 2 days              Patient currently is not medically stable to d/c.   Difficult to place patient No   Subjective:   No acute issues/events this am. Patient denies headache, fevers, nausea, vomiting, constipation diarrhea shortness of breath or chest pain.  No further bowel movement since admission but does report black stool previously.  Objective:    Vitals:   01/24/21 0105 01/24/21 0140 01/24/21 0452 01/24/21 0741  BP: (!) 147/73 (!) 147/70 137/85 (!) 154/73  Pulse: 81 73 79 87  Resp: 18 17 19 16   Temp: 98.4 F (36.9 C) 98.4 F (36.9 C) 98.8 F (37.1 C) 98.3 F (36.8 C)  TempSrc: Oral Oral Oral Oral  SpO2: 100% 100% 100% 100%  Weight:   78.7 kg   Height:       No data found.   Intake/Output Summary (Last 24 hours) at 01/24/2021 0832 Last data filed at 01/24/2021 0452 Gross per 24 hour  Intake 1424 ml  Output 2000 ml  Net -576 ml    Filed Weights   01/22/21 0223 01/23/21 0213 01/24/21 0452  Weight: 78.4 kg 80.5 kg 78.7 kg    Exam:  GEN: NAD SKIN: Warm and dry EYES: No pallor or icterus ENT: MMM CV: RRR PULM: CTA B ABD: soft, ND, NT, +BS CNS: AAO x 3, non focal EXT: Right leg pitting edema (  2+), significant swelling of the left calf   Data Reviewed:   Labs: Labs show the following:   Basic Metabolic Panel: Recent Labs  Lab 01/21/21 1024 01/22/21 0618 01/23/21 0615 01/24/21 0536  NA 141 139 140 139  K 4.1 4.2 3.7 3.6  CL 108 107 109 106  CO2 11* 22 20* 21*  GLUCOSE 175* 115* 107* 99  BUN 42* 51* 49* 41*  CREATININE 2.50* 2.35* 2.42* 2.09*  CALCIUM 8.7* 8.2* 8.3* 8.5*   GFR Estimated Creatinine Clearance: 21.8 mL/min (A) (by C-G formula based on SCr of 2.09 mg/dL (H)). Liver Function Tests: Recent Labs  Lab 01/21/21 1024  AST 67*  ALT 35  ALKPHOS 51  BILITOT 0.8  PROT 5.6*  ALBUMIN 2.7*   No results for input(s): LIPASE, AMYLASE in the last 168 hours. No results for input(s): AMMONIA in the last 168 hours. Coagulation profile Recent Labs  Lab 01/21/21 1024 01/22/21 0618  INR 1.6* 1.4*    CBC: Recent Labs  Lab 01/21/21 1024 01/22/21 1246 01/23/21 1427 01/24/21 0536  WBC 15.6* 18.0*  --  20.8*  NEUTROABS 13.6* 16.2*  --  18.7*  HGB 6.0* 5.1* 5.3* 8.2*  HCT 20.7* 15.8* 16.1* 25.3*  MCV 93.2 85.4  --  89.1  PLT 89* 120*  --  157   Cardiac Enzymes: Recent Labs  Lab 01/22/21 0618  CKTOTAL 837*   BNP (last 3 results) No results for input(s): PROBNP in the last 8760 hours. CBG: No results for input(s): GLUCAP in the last 168 hours. D-Dimer: No results for input(s): DDIMER in the last 72 hours. Hgb A1c: No results for input(s): HGBA1C in the last 72 hours. Lipid Profile: No results for input(s): CHOL, HDL, LDLCALC, TRIG, CHOLHDL, LDLDIRECT in the last 72 hours. Thyroid function studies: No results for input(s): TSH, T4TOTAL, T3FREE, THYROIDAB in the last 72  hours.  Invalid input(s): FREET3 Anemia work up: Recent Labs    01/21/21 1024  TIBC 175*  IRON 40*   Sepsis Labs: Recent Labs  Lab 01/21/21 1024 01/21/21 2150 01/22/21 0618 01/22/21 1246 01/24/21 0536  PROCALCITON  --   --  3.62  --   --   WBC 15.6*  --   --  18.0* 20.8*  LATICACIDVEN >11.0*  >11.0* 3.2*  --  2.3* 1.9    Microbiology Recent Results (from the past 240 hour(s))  Blood culture (routine single)     Status: None (Preliminary result)   Collection Time: 01/21/21 10:55 AM   Specimen: BLOOD RIGHT FOREARM  Result Value Ref Range Status   Specimen Description BLOOD RIGHT FOREARM  Final   Special Requests   Final    BOTTLES DRAWN AEROBIC AND ANAEROBIC Blood Culture adequate volume   Culture   Final    NO GROWTH 3 DAYS Performed at [facility], [address] [place], [place] [address]    Report Status PENDING  Incomplete  Urine culture     Status: None   Collection Time: 01/21/21 10:55 AM   Specimen: In/Out Cath Urine  Result Value Ref Range Status   Specimen Description   Final    IN/OUT CATH URINE Performed at [facility], [address] [place], [place] [address]    Special Requests   Final    NONE Performed at [facility], [address], [place], [place] [address]    Culture   Final    NO GROWTH Performed at [facility], [place]  [address] [place], [place] [address]    Report Status 01/22/2021 FINAL  Final  Blood culture (single)     Status: None (Preliminary result)   Collection Time: 01/21/21 10:55 AM   Specimen: BLOOD  Result Value Ref Range Status   Specimen Description BLOOD LEFT [facility] [place]  Final   Special Requests   Final    BOTTLES DRAWN AEROBIC AND ANAEROBIC Blood Culture adequate volume   Culture   Final    NO GROWTH 3 DAYS Performed at [facility], [address], [place], [place] [address]    Report Status PENDING  Incomplete    Procedures and diagnostic studies:  DG Chest Port 1  View  Result Date: 01/22/2021 CLINICAL DATA:  Hypoxia EXAM: PORTABLE CHEST 1 VIEW COMPARISON:  01/21/2021 FINDINGS: Cardiac shadow is stable. Pacing device is again seen. Aortic calcifications are noted. Lungs are well aerated bilaterally. No focal infiltrate or effusion is seen. No acute bony abnormality is noted. IMPRESSION: No active disease. Electronically Signed   By: Inez Catalina M.D.   On: 01/22/2021 16:18      LOS: 3 days   Little Ishikawa  [facility]   Pager on secure chat  If 7PM-7AM, please contact night-coverage at [email or web address]   01/24/2021, 8:32 AM

## 2021-01-24 NOTE — Care Management Important Message (Signed)
Important Message  Patient Details  Name: Devin Washington MRN: [id number] Date of Birth: 11/19/1923   Medicare Important Message Given:  Yes     Dannette Barbara 01/24/2021, 2:05 PM

## 2021-01-25 ENCOUNTER — Encounter: Payer: Self-pay | Admitting: Internal Medicine

## 2021-01-25 ENCOUNTER — Inpatient Hospital Stay: Payer: Medicare HMO | Admitting: Anesthesiology

## 2021-01-25 ENCOUNTER — Encounter
Admission: EM | Disposition: A | Discharge status: Skilled Nursing Facility | Payer: Self-pay | Source: Home / Self Care | Attending: Internal Medicine

## 2021-01-25 ENCOUNTER — Other Ambulatory Visit: Payer: Self-pay

## 2021-01-25 DIAGNOSIS — A419 Sepsis, unspecified organism: Secondary | ICD-10-CM | POA: Diagnosis not present

## 2021-01-25 DIAGNOSIS — R6520 Severe sepsis without septic shock: Secondary | ICD-10-CM | POA: Diagnosis not present

## 2021-01-25 HISTORY — PX: ESOPHAGOGASTRODUODENOSCOPY (EGD) WITH PROPOFOL: SHX5813

## 2021-01-25 LAB — COMPREHENSIVE METABOLIC PANEL
ALT: 28 U/L (ref 0–44)
AST: 33 U/L (ref 15–41)
Albumin: 2.5 g/dL — ABNORMAL LOW (ref 3.5–5.0)
Alkaline Phosphatase: 42 U/L (ref 38–126)
Anion gap: 8 (ref 5–15)
BUN: 38 mg/dL — ABNORMAL HIGH (ref 8–23)
CO2: 24 mmol/L (ref 22–32)
Calcium: 8.4 mg/dL — ABNORMAL LOW (ref 8.9–10.3)
Chloride: 108 mmol/L (ref 98–111)
Creatinine, Ser: 2.04 mg/dL — ABNORMAL HIGH (ref 0.61–1.24)
GFR, Estimated: 29 mL/min — ABNORMAL LOW (ref >60)
Glucose, Bld: 101 mg/dL — ABNORMAL HIGH (ref 70–99)
Potassium: 3.7 mmol/L (ref 3.5–5.1)
Sodium: 140 mmol/L (ref 135–145)
Total Bilirubin: 1.1 mg/dL (ref 0.3–1.2)
Total Protein: 4.9 g/dL — ABNORMAL LOW (ref 6.5–8.1)

## 2021-01-25 LAB — CBC
HCT: 23.1 % — ABNORMAL LOW (ref 39.0–52.0)
Hemoglobin: 7.8 g/dL — ABNORMAL LOW (ref 13.0–17.0)
MCH: 29.4 pg (ref 26.0–34.0)
MCHC: 33.8 g/dL (ref 30.0–36.0)
MCV: 87.2 fL (ref 80.0–100.0)
Platelets: 177 10*3/uL (ref 150–400)
RBC: 2.65 MIL/uL — ABNORMAL LOW (ref 4.22–5.81)
RDW: 17.2 % — ABNORMAL HIGH (ref 11.5–15.5)
WBC: 14.8 10*3/uL — ABNORMAL HIGH (ref 4.0–10.5)
nRBC: 0.1 % (ref 0.0–0.2)

## 2021-01-25 LAB — PREPARE RBC (CROSSMATCH)

## 2021-01-25 SURGERY — ESOPHAGOGASTRODUODENOSCOPY (EGD) WITH PROPOFOL
Anesthesia: General

## 2021-01-25 MED ORDER — SODIUM CHLORIDE 0.9 % IV SOLN: INTRAVENOUS | Status: DC

## 2021-01-25 MED ORDER — PROPOFOL 500 MG/50ML IV EMUL
INTRAVENOUS | Status: DC | PRN
  Administered 2021-01-25: 75 ug/kg/min via INTRAVENOUS

## 2021-01-25 MED ORDER — PROPOFOL 10 MG/ML IV BOLUS
INTRAVENOUS | Status: DC | PRN
  Administered 2021-01-25: 40 mg via INTRAVENOUS

## 2021-01-25 NOTE — Evaluation (Signed)
Occupational Therapy Evaluation Patient Details Name: Devin Washington MRN: [id number] DOB: 16-Oct-1923 Today's Date: 01/25/2021    History of Present Illness Pt is a 87 y.o. male seen for evaluation of acute blood loss anemia, melena at the request of Dr. Mal Misty. Pt has a PMH of iron-deficiency anemia, hx of erosive esophagitis, Hx of peptic ulcer disease, HTN, s/p pacemaker in situ 2/2 SSS, paroxsymal atrial fibrillation, PVD, and Hx of prostate cancer. He presented to the [facility] ED via EMS on 02/07 for evaluation of altered mental status. Pt with syncopal episode in ED, also with hemoglobin 5.3, s/p transfusions, hg 8.2 (2/10), but AM of 2/11 7.8   Clinical Impression   Devin Washington was seen for OT evaluation this date. Prior to hospital admission, pt was MOD I for mobility and ADLs using RW. Pt lives alone with granddaughter and 40yo girlfriend available 24/7. Pt presents to acute OT demonstrating impaired ADL performance and functional mobility 2/2 decreased activity tolerance and functional ROM/balance/strength deficits. Pt currently requires SUPERVISION doff B socks seated EOC, MIN A don B socks. CGA + RW for ADL transfer and ~32min standing grooming tasks. Pt would benefit from skilled OT to address noted impairments and functional limitations (see below for any additional details) in order to maximize safety and independence while minimizing falls risk and caregiver burden. Upon hospital discharge, recommend HHOT to maximize pt safety and return to functional independence during meaningful occupations of daily life.     Follow Up Recommendations  Home health OT;Supervision/Assistance - 24 hour    Equipment Recommendations  3 in 1 bedside commode    Recommendations for Other Services       Precautions / Restrictions Precautions Precautions: Fall Restrictions Weight Bearing Restrictions: No      Mobility Bed Mobility   Bed Mobility: Supine to Sit       Sit to supine: HOB  elevated;Supervision   General bed mobility comments: received and left in chair    Transfers Overall transfer level: Needs assistance Equipment used: Rolling walker (2 wheeled) Transfers: Sit to/from Stand Sit to Stand: Min guard         General transfer comment: requires B armrests and increased time    Balance Overall balance assessment: Needs assistance Sitting-balance support: Feet supported Sitting balance-Leahy Scale: Good     Standing balance support: Single extremity supported Standing balance-Leahy Scale: Fair Standing balance comment: impulsive and requires RW for support                           ADL either performed or assessed with clinical judgement   ADL Overall ADL's : Needs assistance/impaired                                       General ADL Comments: SUPERVISION doff B socks seated EOC, MIN A don B socks. CGA + RW for ADL transfer and ~43min standing grooming ADLs                  Pertinent Vitals/Pain Pain Assessment: No/denies pain     Hand Dominance Right   Extremity/Trunk Assessment Upper Extremity Assessment Upper Extremity Assessment: Overall WFL for tasks assessed   Lower Extremity Assessment Lower Extremity Assessment: Generalized weakness   Cervical / Trunk Assessment Cervical / Trunk Assessment: Kyphotic   Communication Communication Communication: No difficulties   Cognition Arousal/Alertness: Awake/alert  Behavior During Therapy: WFL for tasks assessed/performed Overall Cognitive Status: Within Functional Limits for tasks assessed                                 General Comments: HOH   General Comments  SpO2 97% on RA with increased WOB    Exercises Exercises: Other exercises Other Exercises Other Exercises: Pt educated re; OT role, DME recs, d/c recs, ECS, falls prevention, HEP Other Exercises: LBD, grooming, sup<>sit, sit<>stand, sitting/standing balance/toelrance    Shoulder Instructions      Home Living Family/patient expects to be discharged to:: Private residence Living Arrangements: Alone Available Help at Discharge: Family (granddaughters and 63yo girlfriend) Type of Home: House Home Access: Stairs to enter Technical brewer of Steps: 2 Entrance Stairs-Rails: Left;Right Home Layout: One level     Bathroom Shower/Tub: Occupational psychologist: Standard     Home Equipment: Environmental consultant - 2 wheels;Cane - single point;Bedside commode;Grab bars - tub/shower   Additional Comments: 1 fall in the last 6 months.      Prior Functioning/Environment Level of Independence: Independent with assistive device(s)        Comments: modI with walker, drives, has meals on wheels        OT Problem List: Decreased strength;Decreased activity tolerance;Decreased range of motion;Impaired balance (sitting and/or standing)      OT Treatment/Interventions: Self-care/ADL training;Therapeutic exercise;Energy conservation;DME and/or AE instruction;Therapeutic activities;Patient/family education;Balance training    OT Goals(Current goals can be found in the care plan section) Acute Rehab OT Goals Patient Stated Goal: to get his strength back OT Goal Formulation: With patient Time For Goal Achievement: 02/08/21 Potential to Achieve Goals: Good ADL Goals Pt Will Perform Grooming: with modified independence;standing (c LRAD PRN) Pt Will Perform Lower Body Dressing: with modified independence;sit to/from stand (c LRAD PRN) Pt Will Transfer to Toilet: with modified independence;ambulating;regular height toilet (c LRAD PRN)  OT Frequency: Min 2X/week   Barriers to D/C: Decreased caregiver support             AM-PAC OT "6 Clicks" Daily Activity     Outcome Measure Help from another person eating meals?: None Help from another person taking care of personal grooming?: A Little Help from another person toileting, which includes using toliet,  bedpan, or urinal?: A Little Help from another person bathing (including washing, rinsing, drying)?: A Little Help from another person to put on and taking off regular upper body clothing?: A Little Help from another person to put on and taking off regular lower body clothing?: A Little 6 Click Score: 19   End of Session Equipment Utilized During Treatment: Rolling walker Nurse Communication: Mobility status  Activity Tolerance: Patient tolerated treatment well Patient left: in chair;with call bell/phone within reach;with chair alarm set  OT Visit Diagnosis: Other abnormalities of gait and mobility (R26.89);Muscle weakness (generalized) (M62.81)                Time: 1050-1105 OT Time Calculation (min): 15 min Charges:  OT General Charges $OT Visit: 1 Visit OT Evaluation $OT Eval Low Complexity: 1 Low OT Treatments $Self Care/Home Management : 8-22 mins  Dessie Coma, M.S. OTR/L  01/25/21, 12:54 PM  ascom [phone]

## 2021-01-25 NOTE — Progress Notes (Addendum)
Physical Therapy Treatment Patient Details Name: Devin Washington MRN: [id number] DOB: August 29, 1923 Today's Date: 01/25/2021    History of Present Illness Pt is a 87 y.o. male seen for evaluation of acute blood loss anemia, melena at the request of Dr. Mal Misty. Pt has a PMH of iron-deficiency anemia, hx of erosive esophagitis, Hx of peptic ulcer disease, HTN, s/p pacemaker in situ 2/2 SSS, paroxsymal atrial fibrillation, PVD, and Hx of prostate cancer. He presented to the [facility] ED via EMS on 02/07 for evaluation of altered mental status. Pt with syncopal episode in ED, also with hemoglobin 5.3, s/p transfusions, hg 8.2 (2/10), but AM of 2/11 7.8    PT Comments    Patient easily wakes at start of session, agreeable to PT. Supine to sit with HOB elevated with supervision, fair sitting balance. Pt performed sit <> Stand transfers three times during session from varying surfaces, CGA with RW, effortful and use of arms but pt able to complete without physical assist. He also ambulated ~84ft with RW and CGA, decreased SOB noted from previous session but pt still fatigued. Seated exercises performed with visual and verbal cueing. Pt up in chair with all needs in reach at end of session. The patient would benefit from further skilled PT intervention to continue to progress towards goals. Recommendation remains appropriate.    Follow Up Recommendations  Home health PT;Supervision for mobility/OOB;Supervision/Assistance - 24 hour     Equipment Recommendations  None recommended by PT    Recommendations for Other Services OT consult     Precautions / Restrictions Precautions Precautions: Fall Restrictions Weight Bearing Restrictions: No    Mobility  Bed Mobility   Bed Mobility: Supine to Sit       Sit to supine: HOB elevated;Supervision   General bed mobility comments: use of bed rails    Transfers Overall transfer level: Needs assistance Equipment used: Rolling walker (2  wheeled) Transfers: Sit to/from Stand Sit to Stand: Min guard         General transfer comment: x3 this session, from EOB, BSC and recliner. with adequate UE support, pt able to complete CGA  Ambulation/Gait Ambulation/Gait assistance: Min guard Gait Distance (Feet): 30 Feet Assistive device: Rolling walker (2 wheeled)       General Gait Details: decreased gait velocity, flexed trunk, decreased step height/length   Stairs             Wheelchair Mobility    Modified Rankin (Stroke Patients Only)       Balance Overall balance assessment: Needs assistance Sitting-balance support: Feet supported Sitting balance-Leahy Scale: Good       Standing balance-Leahy Scale: Fair Standing balance comment: improved safety with RW, needed for all dynamic balance/ambulation                            Cognition Arousal/Alertness: Awake/alert Behavior During Therapy: WFL for tasks assessed/performed Overall Cognitive Status: Within Functional Limits for tasks assessed                                 General Comments: HOH      Exercises Other Exercises Other Exercises: seated marching, LAQ, heel/toes raises x15 bilaterally    General Comments        Pertinent Vitals/Pain Pain Assessment: No/denies pain    Home Living  Prior Function            PT Goals (current goals can now be found in the care plan section) Progress towards PT goals: Progressing toward goals    Frequency    Min 2X/week      PT Plan Current plan remains appropriate    Co-evaluation              AM-PAC PT "6 Clicks" Mobility   Outcome Measure  Help needed turning from your back to your side while in a flat bed without using bedrails?: A Little Help needed moving from lying on your back to sitting on the side of a flat bed without using bedrails?: A Little Help needed moving to and from a bed to a chair (including a  wheelchair)?: A Little Help needed standing up from a chair using your arms (e.g., wheelchair or bedside chair)?: A Little Help needed to walk in hospital room?: A Little Help needed climbing 3-5 steps with a railing? : A Little 6 Click Score: 18    End of Session Equipment Utilized During Treatment: Gait belt Activity Tolerance: Patient tolerated treatment well Patient left: in chair;with call bell/phone within reach;with chair alarm set Nurse Communication: Mobility status PT Visit Diagnosis: Other abnormalities of gait and mobility (R26.89);Muscle weakness (generalized) (M62.81);Difficulty in walking, not elsewhere classified (R26.2);Unsteadiness on feet (R26.81)     Time: 1610-9604 PT Time Calculation (min) (ACUTE ONLY): 23 min  Charges:  $Therapeutic Exercise: 23-37 mins                     Lieutenant Diego PT, DPT 10:12 AM,01/25/21

## 2021-01-25 NOTE — Progress Notes (Signed)
Progress Note    Devin Washington  IOE:[id number] DOB: October 27, 1923  DOA: 01/21/2021 PCP: Albina Billet, MD   Brief Narrative:   Medical records reviewed and are as summarized below:  Devin Washington is a 87 y.o. male  with medical history significant for iron deficiency anemia, history of erosive esophagitis, history of peptic ulcer disease, hypertension, status post pacemaker insertion for sick sinus syndrome, peripheral vascular disease who was brought to the hospital because of altered mental status.  He lives at home alone.  He has been having dark stools at home.  He has chronic anemia and he goes to the  infusion center for periodic blood transfusions. He was hypothermic, hypoxic and hypotensive on admission.  Lactic acid was significantly elevated (greater than 11). He was admitted to the hospital for severe sepsis.  He was also found to have severe anemia with hemoglobin of 6.   Assessment/Plan:   Principal Problem:   Severe sepsis (HCC) Active Problems:   CKD (chronic kidney disease), stage III (HCC)   Hypertension   Symptomatic anemia   CHF (congestive heart failure) (HCC)   Peripheral artery disease (HCC)   Anemia in chronic kidney disease  Severe sepsis secondary to enterocolitis, POA Noted on CT abdomen and pelvis: Repeat chest x-ray on 01/22/2021 did not show any active infection.  Discontinue IV vancomycin.   Continue cefepime and Flagyl. Check stool for C. difficile toxin.  Acute (unknown type) on chronic iron deficiency anemia/chronic anemia of chronic kidney disease:  Hemoglobin improved, currently 7.8 s/p 2 unit PRBC Of note, he has been receiving Retacrit and blood transfusion periodically at the cancer center as an outpatient indicating somewhat chronic production issue.  GI bleeding/dark stools:  Profound history of GI bleeding per GI.  Planned endoscopy today per GI  AKI on CKD stage IIIb:  Secondary to above, pre-renal azotemia due to profound anemia.   Improving -essentially back to baseline around 2.0  PVD with history of bilateral popliteal and iliac artery aneurysm: Aspirin has been held.  Venous Doppler of the left lower extremity showed popliteal artery aneurysm which is a normal finding.  He sees Dr. Lucky Cowboy, vascular surgeon, as an outpatient.  Syncope: Noted in the emergency room 01/21/2019 while being assisted to the bedside toilet for bowel movement.  Likely vasovagal syncope  Diet Order            Diet NPO time specified  Diet effective midnight                Consultants:  GI  Procedures:  EGD 01/25/2021  Medications:   . sodium chloride   Intravenous Once  . acetaminophen  325 mg Oral BID  . feeding supplement  1 Bottle Oral Q24H  . ferrous sulfate  325 mg Oral Q breakfast  . furosemide  20 mg Oral Daily  . pantoprazole (PROTONIX) IV  40 mg Intravenous Q12H  . vancomycin variable dose per unstable renal function (pharmacist dosing)   Does not apply See admin instructions  . cyanocobalamin  1,000 mcg Oral Daily   Continuous Infusions: . sodium chloride 20 mL/hr at 01/25/21 0153  . ceFEPime (MAXIPIME) IV Stopped (01/24/21 1304)  . metronidazole 500 mg (01/25/21 1829)    Anti-infectives (From admission, onward)   Start     Dose/Rate Route Frequency Ordered Stop   01/23/21 0903  vancomycin variable dose per unstable renal function (pharmacist dosing)         Does not apply  See admin instructions 01/23/21 0903     01/22/21 1200  ceFEPIme (MAXIPIME) 2 g in sodium chloride 0.9 % 100 mL IVPB        2 g 200 mL/hr over 30 Minutes Intravenous Every 24 hours 01/21/21 1649     01/21/21 1600  vancomycin (VANCOCIN) IVPB 1000 mg/200 mL premix        1,000 mg 200 mL/hr over 60 Minutes Intravenous  Once 01/21/21 1438 01/21/21 2107   01/21/21 1500  metroNIDAZOLE (FLAGYL) IVPB 500 mg        500 mg 100 mL/hr over 60 Minutes Intravenous Every 8 hours 01/21/21 1438     01/21/21 1445  ceFEPIme (MAXIPIME) 2 g in sodium chloride  0.9 % 100 mL IVPB  Status:  Discontinued        2 g 200 mL/hr over 30 Minutes Intravenous  Once 01/21/21 1438 01/21/21 1440   01/21/21 1230  vancomycin (VANCOREADY) IVPB 500 mg/100 mL        500 mg 100 mL/hr over 60 Minutes Intravenous  Once 01/21/21 1141 01/21/21 1355   01/21/21 1130  ceFEPIme (MAXIPIME) 2 g in sodium chloride 0.9 % 100 mL IVPB        2 g 200 mL/hr over 30 Minutes Intravenous  Once 01/21/21 1129 01/21/21 1228   01/21/21 1130  vancomycin (VANCOCIN) IVPB 1000 mg/200 mL premix        1,000 mg 200 mL/hr over 60 Minutes Intravenous  Once 01/21/21 1129 01/21/21 1256      Family Communication/Anticipated D/C date and plan/Code Status   DVT prophylaxis: SCDs Start: 01/21/21 1437    Code Status: DNR  Family Communication: None Disposition Plan:   Status is: Inpatient  Remains inpatient appropriate because:IV treatments appropriate due to intensity of illness or inability to take PO   Dispo: The patient is from: Home              Anticipated d/c is to: Home              Anticipated d/c date is: 2 days              Patient currently is not medically stable to d/c.   Difficult to place patient No   Subjective:   No acute issues/events this am. Patient denies headache, fevers, nausea, vomiting, constipation diarrhea shortness of breath or chest pain.  No further bowel movement since admission but does report black stool previously.  Objective:    Vitals:   01/24/21 1938 01/25/21 0100 01/25/21 0448 01/25/21 0806  BP: (!) 141/63  136/64 130/70  Pulse: 99  92 88  Resp: 19  16 18   Temp: 98.7 F (37.1 C)  99.5 F (37.5 C) 98 F (36.7 C)  TempSrc: Oral  Oral Oral  SpO2: 100%  100% 98%  Weight:  78.9 kg    Height:       No data found.   Intake/Output Summary (Last 24 hours) at 01/25/2021 0807 Last data filed at 01/25/2021 0509 Gross per 24 hour  Intake 1779.05 ml  Output 1500 ml  Net 279.05 ml   Filed Weights   01/23/21 0213 01/24/21 0452 01/25/21 0100   Weight: 80.5 kg 78.7 kg 78.9 kg    Exam:  GEN: NAD SKIN: Warm and dry EYES: No pallor or icterus ENT: MMM CV: RRR PULM: CTA B ABD: soft, ND, NT, +BS CNS: AAO x 3, non focal EXT: Right leg pitting edema ( 2+), significant swelling of the left  calf   Data Reviewed:   Labs: Labs show the following:   Basic Metabolic Panel: Recent Labs  Lab 01/21/21 1024 01/22/21 0618 01/23/21 0615 01/24/21 0536 01/25/21 0442  NA 141 139 140 139 140  K 4.1 4.2 3.7 3.6 3.7  CL 108 107 109 106 108  CO2 11* 22 20* 21* 24  GLUCOSE 175* 115* 107* 99 101*  BUN 42* 51* 49* 41* 38*  CREATININE 2.50* 2.35* 2.42* 2.09* 2.04*  CALCIUM 8.7* 8.2* 8.3* 8.5* 8.4*   GFR Estimated Creatinine Clearance: 22.4 mL/min (A) (by C-G formula based on SCr of 2.04 mg/dL (H)). Liver Function Tests: Recent Labs  Lab 01/21/21 1024 01/25/21 0442  AST 67* 33  ALT 35 28  ALKPHOS 51 42  BILITOT 0.8 1.1  PROT 5.6* 4.9*  ALBUMIN 2.7* 2.5*   No results for input(s): LIPASE, AMYLASE in the last 168 hours. No results for input(s): AMMONIA in the last 168 hours. Coagulation profile Recent Labs  Lab 01/21/21 1024 01/22/21 0618  INR 1.6* 1.4*    CBC: Recent Labs  Lab 01/21/21 1024 01/22/21 1246 01/23/21 1427 01/24/21 0536 01/25/21 0442  WBC 15.6* 18.0*  --  20.8* 14.8*  NEUTROABS 13.6* 16.2*  --  18.7*  --   HGB 6.0* 5.1* 5.3* 8.2* 7.8*  HCT 20.7* 15.8* 16.1* 25.3* 23.1*  MCV 93.2 85.4  --  89.1 87.2  PLT 89* 120*  --  157 177   Cardiac Enzymes: Recent Labs  Lab 01/22/21 0618  CKTOTAL 837*   BNP (last 3 results) No results for input(s): PROBNP in the last 8760 hours. CBG: No results for input(s): GLUCAP in the last 168 hours. D-Dimer: No results for input(s): DDIMER in the last 72 hours. Hgb A1c: No results for input(s): HGBA1C in the last 72 hours. Lipid Profile: No results for input(s): CHOL, HDL, LDLCALC, TRIG, CHOLHDL, LDLDIRECT in the last 72 hours. Thyroid function studies: No  results for input(s): TSH, T4TOTAL, T3FREE, THYROIDAB in the last 72 hours.  Invalid input(s): FREET3 Anemia work up: No results for input(s): VITAMINB12, FOLATE, FERRITIN, TIBC, IRON, RETICCTPCT in the last 72 hours. Sepsis Labs: Recent Labs  Lab 01/21/21 1024 01/21/21 2150 01/22/21 0618 01/22/21 1246 01/24/21 0536 01/25/21 0442  PROCALCITON  --   --  3.62  --   --   --   WBC 15.6*  --   --  18.0* 20.8* 14.8*  LATICACIDVEN >11.0*  >11.0* 3.2*  --  2.3* 1.9  --     Microbiology Recent Results (from the past 240 hour(s))  Blood culture (routine single)     Status: None (Preliminary result)   Collection Time: 01/21/21 10:55 AM   Specimen: BLOOD RIGHT FOREARM  Result Value Ref Range Status   Specimen Description BLOOD RIGHT FOREARM  Final   Special Requests   Final    BOTTLES DRAWN AEROBIC AND ANAEROBIC Blood Culture adequate volume   Culture   Final    NO GROWTH 4 DAYS Performed at [facility], [address], [place], [place] [address]    Report Status PENDING  Incomplete  Urine culture     Status: None   Collection Time: 01/21/21 10:55 AM   Specimen: In/Out Cath Urine  Result Value Ref Range Status   Specimen Description   Final    IN/OUT CATH URINE Performed at [facility], [address] [place], [place] [address]    Special Requests   Final    NONE Performed at  [facility], [address] [place], [place] [address]    Culture   Final    NO GROWTH Performed at [facility], [place] [address] [place], [place] [address]    Report Status 01/22/2021 FINAL  Final  Blood culture (single)     Status: None (Preliminary result)   Collection Time: 01/21/21 10:55 AM   Specimen: BLOOD  Result Value Ref Range Status   Specimen Description BLOOD LEFT [facility]  Final   Special Requests   Final    BOTTLES DRAWN AEROBIC AND ANAEROBIC Blood Culture adequate volume   Culture   Final    NO GROWTH 4 DAYS Performed at [facility],  [address] [place],  [address]    Report Status PENDING  Incomplete    Procedures and diagnostic studies:  No results found.    LOS: 4 days   Little Ishikawa  [facility]   Pager on secure chat  If 7PM-7AM, please contact night-coverage at [email or web address]  01/25/2021, 8:07 AM

## 2021-01-25 NOTE — Anesthesia Preprocedure Evaluation (Addendum)
Anesthesia Evaluation  Patient identified by MRN, date of birth, ID band Patient awake    Reviewed: Allergy & Precautions, NPO status , Patient's Chart, lab work & pertinent test results, reviewed documented beta blocker date and time   History of Anesthesia Complications Negative for: history of anesthetic complications  Airway Mallampati: II  TM Distance: >3 FB Neck ROM: Full    Dental  (+) Edentulous Upper, Edentulous Lower   Pulmonary shortness of breath, asthma , former smoker,    breath sounds clear to auscultation       Cardiovascular hypertension, Pt. on medications and Pt. on home beta blockers + Peripheral Vascular Disease and +CHF  + dysrhythmias Atrial Fibrillation + pacemaker + Valvular Problems/Murmurs  Rhythm:Regular Rate:Normal  TTE: INTERPRETATION  MODERATE LV SYSTOLIC DYSFUNCTION WITH AN ESTIMATED EF = 35-40 %  NORMAL RIGHT VENTRICULAR SYSTOLIC FUNCTION  MILD-TO-MODERATE TRICUSPID AND MITRAL VALVE INSUFFICIENCY  MILD AORTIC VALVE INSUFFICIENCY  MILD AORTIC VALVE STENOSIS WITH A CALCULATED AVA = 1.9 cm^2 BY CONTINUITY EQUATION  MILD LV ENLARGEMENT  PACER WIRE NOTED     Neuro/Psych  Neuromuscular disease    GI/Hepatic hiatal hernia, PUD,   Endo/Other    Renal/GU CRFRenal disease     Musculoskeletal  (+) Arthritis ,   Abdominal   Peds  Hematology  (+) anemia ,   Anesthesia Other Findings Past Medical History: No date: A-fib ([place]) No date: Anemia No date: Asthma No date: Cardiomyopathy ([place]) No date: CHF (congestive heart failure) (HCC) No date: DJD (degenerative joint disease) 08/09/2015: Duodenal ulcer No date: Dysrhythmia No date: Erosive esophagitis No date: HH (hiatus hernia) No date: HLD (hyperlipidemia) No date: Hypertension No date: Pacemaker No date: PAD (peripheral artery disease) ([place]) No date: Peripheral vascular disease ([place]) No date: Presence of permanent cardiac  pacemaker No date: Prostate cancer ([place])     Comment:  Prostatectomy. 08/09/2015: Rectal varices No date: Shortness of breath No date: SSS (sick sinus syndrome) (HCC)   Reproductive/Obstetrics                             Anesthesia Physical  Anesthesia Plan  ASA: III  Anesthesia Plan: General   Post-op Pain Management:    Induction: Intravenous  PONV Risk Score and Plan: 2 and Ondansetron, Propofol infusion, TIVA and Treatment may vary due to age or medical condition  Airway Management Planned: Nasal Cannula  Additional Equipment: None  Intra-op Plan:   Post-operative Plan:   Informed Consent: I have reviewed the patients History and Physical, chart, labs and discussed the procedure including the risks, benefits and alternatives for the proposed anesthesia with the patient or authorized representative who has indicated his/her understanding and acceptance.   Patient has DNR.  Discussed DNR with patient and Suspend DNR.   Dental advisory given  Plan Discussed with: CRNA  Anesthesia Plan Comments: (Discussed risks of anesthesia with patient, including possibility of difficulty with spontaneous ventilation under anesthesia necessitating airway intervention, PONV, and rare risks such as cardiac or respiratory or neurological events. Patient understands.)       Anesthesia Quick Evaluation

## 2021-01-25 NOTE — Consult Note (Addendum)
Pharmacy Antibiotic Note  Devin Washington is a 87 y.o. male admitted on 01/21/2021 due with AMS/decreased alertness and fall, with no injuries. Pt was hypothermic and hypotensive upon admission. WBC elevated at 15.6. PMH significant for pacemaker insertion Pharmacy has been consulted for Cefepime and Vancomycin dosing for sepsis. Pt elevated Scr of 2.5 upon admission, previous levels around 1.9. Scr is trending down.   01/25/21: Day 5 of cefepime/flagly. WBC 20 > 14. Lactic acid resolved. Afebrile last 24 hours. Enterocolitis on CT abdomen and pelvis  Plan:  Continue Cefepime 2g IV q24h  Continue flagyl 500 mg q8H.   Follow up with anticipated LOT for antibiotics    Height: 5\' 10"  (177.8 cm) Weight: 78.9 kg (173 lb 14.4 oz) IBW/kg (Calculated) : 73  Temp (24hrs), Avg:98.7 F (37.1 C), Min:98 F (36.7 C), Max:99.5 F (37.5 C)  Recent Labs  Lab 01/21/21 1024 01/21/21 2150 01/22/21 0618 01/22/21 1246 01/23/21 0615 01/24/21 0536 01/25/21 0442  WBC 15.6*  --   --  18.0*  --  20.8* 14.8*  CREATININE 2.50*  --  2.35*  --  2.42* 2.09* 2.04*  LATICACIDVEN >11.0*  >11.0* 3.2*  --  2.3*  --  1.9  --   VANCORANDOM  --   --   --  19  --   --   --     Estimated Creatinine Clearance: 22.4 mL/min (A) (by C-G formula based on SCr of 2.04 mg/dL (H)).    No Known Allergies  Antimicrobials this admission: 01/21/21 cefepime >> 01/21/21 metronidazole >> 01/21/21 vancomycin >>  Dose adjustments this admission: NA   Microbiology results: 2/7 BCx: NGTD 2/7 UCx: NGTD Thank you for allowing pharmacy to be a part of this patient's care.  Dorothe Pea, PharmD, BCPS 01/25/2021 2:08 PM

## 2021-01-25 NOTE — Anesthesia Postprocedure Evaluation (Signed)
Anesthesia Post Note  Patient: Devin Washington  Procedure(s) Performed: ESOPHAGOGASTRODUODENOSCOPY (EGD) WITH PROPOFOL (N/A )  Patient location during evaluation: PACU Anesthesia Type: General Level of consciousness: awake and alert Pain management: pain level controlled Vital Signs Assessment: post-procedure vital signs reviewed and stable Respiratory status: spontaneous breathing, nonlabored ventilation, respiratory function stable and patient connected to nasal cannula oxygen Cardiovascular status: blood pressure returned to baseline and stable Postop Assessment: no apparent nausea or vomiting Anesthetic complications: no   No complications documented.   Last Vitals:  Vitals:   01/25/21 1540 01/25/21 1716  BP: (!) 150/80 (!) 151/81  Pulse: 78 84  Resp: (!) 22 18  Temp:  36.7 C  SpO2: 100% 100%    Last Pain:  Vitals:   01/25/21 1716  TempSrc: Oral  PainSc:                  Molli Barrows

## 2021-01-25 NOTE — Op Note (Signed)
[facility] Gastroenterology Patient Name: Devin Washington Procedure Date: 01/25/2021 2:50 PM MRN: [id number] Account #: [id number] Date of Birth: May 15, 1923 Admit Type: Outpatient Age: 87 Room: [facility] ENDO ROOM 3 Gender: Male Note Status: Finalized Procedure:             Upper GI endoscopy Indications:           Suspected upper gastrointestinal bleeding in patient                         with chronic blood loss Providers:             Andrey Farmer MD, MD Referring MD:          Leona Carry. Hall Busing, MD (Referring MD) Medicines:             Monitored Anesthesia Care Complications:         No immediate complications. Estimated blood loss:                         Minimal. Procedure:             Pre-Anesthesia Assessment:                        - Prior to the procedure, a History and Physical was                         performed, and patient medications and allergies were                         reviewed. The patient is competent. The risks and                         benefits of the procedure and the sedation options and                         risks were discussed with the patient. All questions                         were answered and informed consent was obtained.                         Patient identification and proposed procedure were                         verified by the physician, the nurse, the anesthetist                         and the technician in the endoscopy suite. Mental                         Status Examination: alert and oriented. Airway                         Examination: normal oropharyngeal airway and neck                         mobility. Respiratory Examination: clear to  auscultation. CV Examination: normal. Prophylactic                         Antibiotics: The patient does not require prophylactic                         antibiotics. Prior Anticoagulants: The patient has                         taken no previous  anticoagulant or antiplatelet                         agents. ASA Grade Assessment: III - A patient with                         severe systemic disease. After reviewing the risks and                         benefits, the patient was deemed in satisfactory                         condition to undergo the procedure. The anesthesia                         plan was to use monitored anesthesia care (MAC).                         Immediately prior to administration of medications,                         the patient was re-assessed for adequacy to receive                         sedatives. The heart rate, respiratory rate, oxygen                         saturations, blood pressure, adequacy of pulmonary                         ventilation, and response to care were monitored                         throughout the procedure. The physical status of the                         patient was re-assessed after the procedure.                        After obtaining informed consent, the endoscope was                         passed under direct vision. Throughout the procedure,                         the patient's blood pressure, pulse, and oxygen                         saturations were monitored continuously. The Endoscope  was introduced through the mouth, and advanced to the                         second part of duodenum. The upper GI endoscopy was                         accomplished without difficulty. The patient tolerated                         the procedure well. Findings:      A 3 cm hiatal hernia was present.      A few dispersed erosions with stigmata of recent bleeding were found in       the gastric antrum. Biopsies were taken with a cold forceps for       Helicobacter pylori testing. Estimated blood loss was minimal.      The exam of the stomach was otherwise normal.      The examined duodenum was normal. Impression:            - 3 cm hiatal hernia.                         - Erosive gastropathy with stigmata of recent                         bleeding. Biopsied.                        - Normal examined duodenum. Recommendation:        - Return patient to hospital ward for ongoing care.                        - Resume previous diet.                        - Continue present medications.                        - Await pathology results.                        - Further recs from primary inpatient GI team Procedure Code(s):     --- Professional ---                        [phone], Esophagogastroduodenoscopy, flexible,                         transoral; with biopsy, single or multiple Diagnosis Code(s):     --- Professional ---                        K44.9, Diaphragmatic hernia without obstruction or                         gangrene                        K92.2, Gastrointestinal hemorrhage, unspecified                        R58, Hemorrhage, not elsewhere classified CPT copyright 2019 American  Medical Association. All rights reserved. The codes documented in this report are preliminary and upon coder review may  be revised to meet current compliance requirements. Andrey Farmer MD, MD 01/25/2021 3:29:14 PM Number of Addenda: 0 Note Initiated On: 01/25/2021 2:50 PM Estimated Blood Loss:  Estimated blood loss was minimal.      [facility]

## 2021-01-25 NOTE — Transfer of Care (Signed)
Immediate Anesthesia Transfer of Care Note  Patient: STCLAIR SZYMBORSKI  Procedure(s) Performed: ESOPHAGOGASTRODUODENOSCOPY (EGD) WITH PROPOFOL (N/A )  Patient Location: PACU  Anesthesia Type:General  Level of Consciousness: sedated  Airway & Oxygen Therapy: Patient Spontanous Breathing and Patient connected to nasal cannula oxygen  Post-op Assessment: Report given to RN and Post -op Vital signs reviewed and stable  Post vital signs: Reviewed and stable  Last Vitals:  Vitals Value Taken Time  BP 128/71 01/25/21 1520  Temp 36.2 C 01/25/21 1520  Pulse 86 01/25/21 1525  Resp 24 01/25/21 1525  SpO2 100 % 01/25/21 1525  Vitals shown include unvalidated device data.  Last Pain:  Vitals:   01/25/21 1520  TempSrc: Temporal  PainSc:          Complications: No complications documented.

## 2021-01-25 NOTE — Anesthesia Procedure Notes (Signed)
Date/Time: 01/25/2021 3:14 PM Performed by: Nelda Marseille, CRNA Pre-anesthesia Checklist: Patient identified, Emergency Drugs available, Suction available, Patient being monitored and Timeout performed Oxygen Delivery Method: Nasal cannula

## 2021-01-26 DIAGNOSIS — A419 Sepsis, unspecified organism: Secondary | ICD-10-CM | POA: Diagnosis not present

## 2021-01-26 DIAGNOSIS — R6520 Severe sepsis without septic shock: Secondary | ICD-10-CM | POA: Diagnosis not present

## 2021-01-26 LAB — CBC
HCT: 22.9 % — ABNORMAL LOW (ref 39.0–52.0)
Hemoglobin: 7.5 g/dL — ABNORMAL LOW (ref 13.0–17.0)
MCH: 29.4 pg (ref 26.0–34.0)
MCHC: 32.8 g/dL (ref 30.0–36.0)
MCV: 89.8 fL (ref 80.0–100.0)
Platelets: 197 10*3/uL (ref 150–400)
RBC: 2.55 MIL/uL — ABNORMAL LOW (ref 4.22–5.81)
RDW: 18 % — ABNORMAL HIGH (ref 11.5–15.5)
WBC: 12.5 10*3/uL — ABNORMAL HIGH (ref 4.0–10.5)
nRBC: 0 % (ref 0.0–0.2)

## 2021-01-26 LAB — COMPREHENSIVE METABOLIC PANEL
ALT: 22 U/L (ref 0–44)
AST: 25 U/L (ref 15–41)
Albumin: 2.3 g/dL — ABNORMAL LOW (ref 3.5–5.0)
Alkaline Phosphatase: 42 U/L (ref 38–126)
Anion gap: 9 (ref 5–15)
BUN: 34 mg/dL — ABNORMAL HIGH (ref 8–23)
CO2: 24 mmol/L (ref 22–32)
Calcium: 8.3 mg/dL — ABNORMAL LOW (ref 8.9–10.3)
Chloride: 107 mmol/L (ref 98–111)
Creatinine, Ser: 2.04 mg/dL — ABNORMAL HIGH (ref 0.61–1.24)
GFR, Estimated: 29 mL/min — ABNORMAL LOW (ref >60)
Glucose, Bld: 93 mg/dL (ref 70–99)
Potassium: 3.4 mmol/L — ABNORMAL LOW (ref 3.5–5.1)
Sodium: 140 mmol/L (ref 135–145)
Total Bilirubin: 1 mg/dL (ref 0.3–1.2)
Total Protein: 4.9 g/dL — ABNORMAL LOW (ref 6.5–8.1)

## 2021-01-26 LAB — CULTURE, BLOOD (SINGLE)
Culture: NO GROWTH
Culture: NO GROWTH
Special Requests: ADEQUATE
Special Requests: ADEQUATE

## 2021-01-26 NOTE — Progress Notes (Signed)
Progress Note    Devin Washington  ZJQ:[id number] DOB: 12-Jul-1923  DOA: 01/21/2021 PCP: Albina Billet, MD   Brief Narrative:   Medical records reviewed and are as summarized below:  Devin Washington is a 87 y.o. male  with medical history significant for iron deficiency anemia, history of erosive esophagitis, history of peptic ulcer disease, hypertension, status post pacemaker insertion for sick sinus syndrome, peripheral vascular disease who was brought to the hospital because of altered mental status.  He lives at home alone.  He has been having dark stools at home.  He has chronic anemia and he goes to the  infusion center for periodic blood transfusions. He was hypothermic, hypoxic and hypotensive on admission.  Lactic acid was significantly elevated (greater than 11). He was admitted to the hospital for severe sepsis.  He was also found to have severe anemia with hemoglobin of 6.   Assessment/Plan:   Principal Problem:   Severe sepsis (HCC) Active Problems:   CKD (chronic kidney disease), stage III (HCC)   Hypertension   Symptomatic anemia   CHF (congestive heart failure) (HCC)   Peripheral artery disease (HCC)   Anemia in chronic kidney disease  Severe sepsis secondary to enterocolitis, POA Noted on CT abdomen and pelvis: Repeat chest x-ray on 01/22/2021 did not show any active infection.  Discontinue IV vancomycin.   Continue cefepime and Flagyl x 5-7 days.  Acute (unknown type) on chronic iron deficiency anemia/chronic anemia of chronic kidney disease:  Hemoglobin stable, currently 7.8 s/p 2 unit PRBC Of note, he has been receiving Retacrit and blood transfusion periodically at the cancer center as an outpatient indicating somewhat chronic production issue.  GI bleeding/dark stools:  Profound history of GI bleeding per GI.  Endoscopy remarkable for 3 cm hiatal hernia. Erosive gastropathy with stigmata of recent bleeding. Biopsied. Normal examined duodenum  AKI on CKD  stage IIIb:  Secondary to above, pre-renal azotemia due to profound anemia.  Improving -essentially back to baseline around 2.0  PVD with history of bilateral popliteal and iliac artery aneurysm: Aspirin has been held.  Venous Doppler of the left lower extremity showed popliteal artery aneurysm which is a normal finding.  He sees Dr. Lucky Cowboy, vascular surgeon, as an outpatient.  Syncope: Noted in the emergency room 01/21/2019 while being assisted to the bedside toilet for bowel movement.  Likely vasovagal syncope  Diet Order            Diet 2 gram sodium Room service appropriate? Yes; Fluid consistency: Thin  Diet effective now                Consultants:  GI  Procedures:  EGD 01/25/2021  Medications:   . sodium chloride   Intravenous Once  . acetaminophen  325 mg Oral BID  . feeding supplement  1 Bottle Oral Q24H  . ferrous sulfate  325 mg Oral Q breakfast  . furosemide  20 mg Oral Daily  . pantoprazole (PROTONIX) IV  40 mg Intravenous Q12H  . cyanocobalamin  1,000 mcg Oral Daily   Continuous Infusions: . ceFEPime (MAXIPIME) IV 2 g (01/26/21 1324)  . metronidazole 500 mg (01/26/21 0600)    Anti-infectives (From admission, onward)   Start     Dose/Rate Route Frequency Ordered Stop   01/23/21 0903  vancomycin variable dose per unstable renal function (pharmacist dosing)  Status:  Discontinued         Does not apply See admin instructions 01/23/21 0903 01/25/21  1406   01/22/21 1200  ceFEPIme (MAXIPIME) 2 g in sodium chloride 0.9 % 100 mL IVPB        2 g 200 mL/hr over 30 Minutes Intravenous Every 24 hours 01/21/21 1649     01/21/21 1600  vancomycin (VANCOCIN) IVPB 1000 mg/200 mL premix        1,000 mg 200 mL/hr over 60 Minutes Intravenous  Once 01/21/21 1438 01/21/21 2107   01/21/21 1500  metroNIDAZOLE (FLAGYL) IVPB 500 mg        500 mg 100 mL/hr over 60 Minutes Intravenous Every 8 hours 01/21/21 1438     01/21/21 1445  ceFEPIme (MAXIPIME) 2 g in sodium chloride 0.9 % 100  mL IVPB  Status:  Discontinued        2 g 200 mL/hr over 30 Minutes Intravenous  Once 01/21/21 1438 01/21/21 1440   01/21/21 1230  vancomycin (VANCOREADY) IVPB 500 mg/100 mL        500 mg 100 mL/hr over 60 Minutes Intravenous  Once 01/21/21 1141 01/21/21 1355   01/21/21 1130  ceFEPIme (MAXIPIME) 2 g in sodium chloride 0.9 % 100 mL IVPB        2 g 200 mL/hr over 30 Minutes Intravenous  Once 01/21/21 1129 01/21/21 1228   01/21/21 1130  vancomycin (VANCOCIN) IVPB 1000 mg/200 mL premix        1,000 mg 200 mL/hr over 60 Minutes Intravenous  Once 01/21/21 1129 01/21/21 1256      Family Communication/Anticipated D/C date and plan/Code Status   DVT prophylaxis: SCDs Start: 01/21/21 1437    Code Status: DNR  Family Communication: None Disposition Plan:   Status is: Inpatient  Remains inpatient appropriate because:IV treatments appropriate due to intensity of illness or inability to take PO   Dispo: The patient is from: Home              Anticipated d/c is to: Home              Anticipated d/c date is: 2 days              Patient currently is not medically stable to d/c.   Difficult to place patient No   Subjective:   No acute issues/events this am. Patient denies headache, fevers, nausea, vomiting, constipation diarrhea shortness of breath or chest pain.  Continues to deny any further bowel movements.  Objective:    Vitals:   01/26/21 0400 01/26/21 0600 01/26/21 0819 01/26/21 1100  BP: 140/73  (!) 150/68 (!) 146/72  Pulse: 92  84 90  Resp: 17 17 16 18   Temp: 98.7 F (37.1 C)  98.5 F (36.9 C) 98.1 F (36.7 C)  TempSrc:   Oral Oral  SpO2: 100%  98% 94%  Weight: 77.3 kg     Height:       No data found.   Intake/Output Summary (Last 24 hours) at 01/26/2021 1407 Last data filed at 01/26/2021 1330 Gross per 24 hour  Intake 1020 ml  Output 450 ml  Net 570 ml   Filed Weights   01/25/21 0100 01/25/21 1440 01/26/21 0400  Weight: 78.9 kg 72.6 kg 77.3 kg     Exam:  GEN: NAD SKIN: Warm and dry EYES: No pallor or icterus ENT: MMM CV: RRR PULM: CTA B ABD: soft, ND, NT, +BS CNS: AAO x 3, non focal EXT: Right leg pitting edema ( 2+), significant swelling of the left calf   Data Reviewed:   Labs: Labs show  the following:   Basic Metabolic Panel: Recent Labs  Lab 01/22/21 0618 01/23/21 0615 01/24/21 0536 01/25/21 0442 01/26/21 0454  NA 139 140 139 140 140  K 4.2 3.7 3.6 3.7 3.4*  CL 107 109 106 108 107  CO2 22 20* 21* 24 24  GLUCOSE 115* 107* 99 101* 93  BUN 51* 49* 41* 38* 34*  CREATININE 2.35* 2.42* 2.09* 2.04* 2.04*  CALCIUM 8.2* 8.3* 8.5* 8.4* 8.3*   GFR Estimated Creatinine Clearance: 22.7 mL/min (A) (by C-G formula based on SCr of 2.04 mg/dL (H)). Liver Function Tests: Recent Labs  Lab 01/21/21 1024 01/25/21 0442 01/26/21 0454  AST 67* 33 25  ALT 35 28 22  ALKPHOS 51 42 42  BILITOT 0.8 1.1 1.0  PROT 5.6* 4.9* 4.9*  ALBUMIN 2.7* 2.5* 2.3*   No results for input(s): LIPASE, AMYLASE in the last 168 hours. No results for input(s): AMMONIA in the last 168 hours. Coagulation profile Recent Labs  Lab 01/21/21 1024 01/22/21 0618  INR 1.6* 1.4*    CBC: Recent Labs  Lab 01/21/21 1024 01/22/21 1246 01/23/21 1427 01/24/21 0536 01/25/21 0442 01/26/21 0454  WBC 15.6* 18.0*  --  20.8* 14.8* 12.5*  NEUTROABS 13.6* 16.2*  --  18.7*  --   --   HGB 6.0* 5.1* 5.3* 8.2* 7.8* 7.5*  HCT 20.7* 15.8* 16.1* 25.3* 23.1* 22.9*  MCV 93.2 85.4  --  89.1 87.2 89.8  PLT 89* 120*  --  157 177 197   Cardiac Enzymes: Recent Labs  Lab 01/22/21 0618  CKTOTAL 837*   BNP (last 3 results) No results for input(s): PROBNP in the last 8760 hours. CBG: No results for input(s): GLUCAP in the last 168 hours. D-Dimer: No results for input(s): DDIMER in the last 72 hours. Hgb A1c: No results for input(s): HGBA1C in the last 72 hours. Lipid Profile: No results for input(s): CHOL, HDL, LDLCALC, TRIG, CHOLHDL, LDLDIRECT in  the last 72 hours. Thyroid function studies: No results for input(s): TSH, T4TOTAL, T3FREE, THYROIDAB in the last 72 hours.  Invalid input(s): FREET3 Anemia work up: No results for input(s): VITAMINB12, FOLATE, FERRITIN, TIBC, IRON, RETICCTPCT in the last 72 hours. Sepsis Labs: Recent Labs  Lab 01/21/21 1024 01/21/21 2150 01/22/21 0618 01/22/21 1246 01/24/21 0536 01/25/21 0442 01/26/21 0454  PROCALCITON  --   --  3.62  --   --   --   --   WBC 15.6*  --   --  18.0* 20.8* 14.8* 12.5*  LATICACIDVEN >11.0*  >11.0* 3.2*  --  2.3* 1.9  --   --     Microbiology Recent Results (from the past 240 hour(s))  Blood culture (routine single)     Status: None   Collection Time: 01/21/21 10:55 AM   Specimen: BLOOD RIGHT FOREARM  Result Value Ref Range Status   Specimen Description BLOOD RIGHT FOREARM  Final   Special Requests   Final    BOTTLES DRAWN AEROBIC AND ANAEROBIC Blood Culture adequate volume   Culture   Final    NO GROWTH 5 DAYS Performed at [facility], [address], [place], [place] [address]    Report Status 01/26/2021 FINAL  Final  Urine culture     Status: None   Collection Time: 01/21/21 10:55 AM   Specimen: In/Out Cath Urine  Result Value Ref Range Status   Specimen Description   Final    IN/OUT CATH URINE Performed at [facility], [address] [place],  [place] [address]    Special Requests   Final    NONE Performed at [facility], [address] [place], [place] [address]    Culture   Final    NO GROWTH Performed at [facility], [address], [place], [place] [address]    Report Status 01/22/2021 FINAL  Final  Blood culture (single)     Status: None   Collection Time: 01/21/21 10:55 AM   Specimen: BLOOD  Result Value Ref Range Status   Specimen Description BLOOD LEFT [facility]  Final   Special Requests   Final    BOTTLES DRAWN AEROBIC AND ANAEROBIC Blood Culture adequate volume   Culture   Final    NO GROWTH 5  DAYS Performed at [facility], [address], [place], [place] [address]    Report Status 01/26/2021 FINAL  Final    Procedures and diagnostic studies:  No results found.    LOS: 5 days   Little Ishikawa  [facility]   Pager on secure chat  If 7PM-7AM, please contact night-coverage at [email or web address]  01/26/2021, 2:07 PM

## 2021-01-26 NOTE — Progress Notes (Signed)
Physical Therapy Treatment Patient Details Name: Devin Washington MRN: [id number] DOB: 01-16-1923 Today's Date: 01/26/2021    History of Present Illness Pt is a 87 y.o. male seen for evaluation of acute blood loss anemia, melena at the request of Dr. Mal Misty. Pt has a PMH of iron-deficiency anemia, hx of erosive esophagitis, Hx of peptic ulcer disease, HTN, s/p pacemaker in situ 2/2 SSS, paroxsymal atrial fibrillation, PVD, and Hx of prostate cancer. He presented to the [facility] ED via EMS on 02/07 for evaluation of altered mental status. Pt with syncopal episode in ED, also with hemoglobin 5.3, s/p transfusions, hg 8.2 (2/10), but AM of 2/11 7.8    PT Comments    Pt was long sitting in bed awake and alert upon arriving. He is HOH but very cooperative and pleasant. He reports he lives alone but does have granddaughter who checks on him. He was agreeable to OOB activity. sao2 > 90% on rm air. Was ble to exit R side of bed without physical assistance. Stood to [address] [employer] with CGA and ambulated with CGA at first however after ~ 15 ft pt becomes very fatigued and requested to turn around and return to room. RR elevated to 44bpm and pt franticly rushed back to bed to sit to recover. After 2 minutes RR returns to low 20s with HR/sao2 stable. Pt is very deconditioned. Did state " I wasn't very active at home over past two years." Lengthy discussion about DC disposition. He is agreeable to SNF if medical team feels appropriate. Author is changing DC rec to SNF due to safety concerns with mobility/activity tolerance. He would benefit from SNF to address these deficits while improving safety with ADL. If pt has assistance at home (24/7) would recommend DC home with [facility] PT   Follow Up Recommendations  Supervision/Assistance - 24 hour;SNF (pt stated he lives alone but has granddaughter who checks on him. Would benefit from SNF at DC to improve endurance and safety deficits.)     Equipment Recommendations  None recommended by  PT    Recommendations for Other Services       Precautions / Restrictions Precautions Precautions: Fall Restrictions Weight Bearing Restrictions: No    Mobility  Bed Mobility Overal bed mobility: Needs Assistance Bed Mobility: Supine to Sit     Supine to sit: Supervision;HOB elevated Sit to supine: Min assist;HOB elevated   General bed mobility comments: Pt was able to exit R side of bed without assist however once fatigued from OOB activity. did require min assist + use of bed rails to return to bed.    Transfers Overall transfer level: Needs assistance Equipment used: Rolling walker (2 wheeled) Transfers: Sit to/from Stand Sit to Stand: Min guard         General transfer comment: CGA for safety. Vcs for proper hand placement for additional safety/improve technique  Ambulation/Gait Ambulation/Gait assistance: Min guard;Min assist Gait Distance (Feet): 30 Feet Assistive device: Rolling walker (2 wheeled) Gait Pattern/deviations: Trunk flexed;Narrow base of support;Step-through pattern Gait velocity: decreased   General Gait Details: Pt was on rm air throughout session with sao2 >90%. He was able to ambulate ~ 30 ft with CGA however pt struggles with last 12 ft. He was extremely SOB with RR 44 bpm. HR/sao2 stable. Min assist towards end of gait 2/2 to patient very SOB and rushing to sit to recover.       Balance Overall balance assessment: Needs assistance Sitting-balance support: Feet supported Sitting balance-Leahy Scale: Good Sitting balance -  Comments: no LOB sitting EOB   Standing balance support: Bilateral upper extremity supported;During functional activity Standing balance-Leahy Scale: Fair Standing balance comment: does demonstarte safety concerns once fatigue with poor gait posture and safety awareness once fatigued         Cognition Arousal/Alertness: Awake/alert Behavior During Therapy: WFL for tasks assessed/performed Overall Cognitive Status:  Within Functional Limits for tasks assessed      General Comments: HOH. Extremely pleasant but very deconditioned.         General Comments General comments (skin integrity, edema, etc.): pt recieved 4 L blood two days prior. Has had slow drop in Hgb since.      Pertinent Vitals/Pain Pain Assessment: No/denies pain           PT Goals (current goals can now be found in the care plan section) Acute Rehab PT Goals Patient Stated Goal: to get his strength back Progress towards PT goals: Progressing toward goals    Frequency    Min 2X/week      PT Plan Discharge plan needs to be updated;Other (comment) (pt agreeable to SNF)       AM-PAC PT "6 Clicks" Mobility   Outcome Measure  Help needed turning from your back to your side while in a flat bed without using bedrails?: A Little Help needed moving from lying on your back to sitting on the side of a flat bed without using bedrails?: A Little Help needed moving to and from a bed to a chair (including a wheelchair)?: A Little Help needed standing up from a chair using your arms (e.g., wheelchair or bedside chair)?: A Little Help needed to walk in hospital room?: A Little Help needed climbing 3-5 steps with a railing? : A Lot 6 Click Score: 17    End of Session Equipment Utilized During Treatment: Gait belt Activity Tolerance: Patient tolerated treatment well Patient left: in bed;with call bell/phone within reach;with bed alarm set;with nursing/sitter in room (RN tech in room) Nurse Communication: Mobility status PT Visit Diagnosis: Other abnormalities of gait and mobility (R26.89);Muscle weakness (generalized) (M62.81);Difficulty in walking, not elsewhere classified (R26.2);Unsteadiness on feet (R26.81)     Time: 1425-1450 PT Time Calculation (min) (ACUTE ONLY): 25 min  Charges:  $Gait Training: 8-22 mins $Therapeutic Activity: 8-22 mins                     Julaine Fusi PTA 01/26/21, 4:12 PM

## 2021-01-27 DIAGNOSIS — R6520 Severe sepsis without septic shock: Secondary | ICD-10-CM | POA: Diagnosis not present

## 2021-01-27 DIAGNOSIS — A419 Sepsis, unspecified organism: Secondary | ICD-10-CM | POA: Diagnosis not present

## 2021-01-27 LAB — COMPREHENSIVE METABOLIC PANEL
ALT: 20 U/L (ref 0–44)
AST: 19 U/L (ref 15–41)
Albumin: 2.4 g/dL — ABNORMAL LOW (ref 3.5–5.0)
Alkaline Phosphatase: 42 U/L (ref 38–126)
Anion gap: 7 (ref 5–15)
BUN: 36 mg/dL — ABNORMAL HIGH (ref 8–23)
CO2: 23 mmol/L (ref 22–32)
Calcium: 7.9 mg/dL — ABNORMAL LOW (ref 8.9–10.3)
Chloride: 108 mmol/L (ref 98–111)
Creatinine, Ser: 1.95 mg/dL — ABNORMAL HIGH (ref 0.61–1.24)
GFR, Estimated: 31 mL/min — ABNORMAL LOW (ref >60)
Glucose, Bld: 116 mg/dL — ABNORMAL HIGH (ref 70–99)
Potassium: 3.4 mmol/L — ABNORMAL LOW (ref 3.5–5.1)
Sodium: 138 mmol/L (ref 135–145)
Total Bilirubin: 0.9 mg/dL (ref 0.3–1.2)
Total Protein: 4.8 g/dL — ABNORMAL LOW (ref 6.5–8.1)

## 2021-01-27 LAB — CBC
HCT: 22.8 % — ABNORMAL LOW (ref 39.0–52.0)
Hemoglobin: 7.3 g/dL — ABNORMAL LOW (ref 13.0–17.0)
MCH: 29.2 pg (ref 26.0–34.0)
MCHC: 32 g/dL (ref 30.0–36.0)
MCV: 91.2 fL (ref 80.0–100.0)
Platelets: 210 10*3/uL (ref 150–400)
RBC: 2.5 MIL/uL — ABNORMAL LOW (ref 4.22–5.81)
RDW: 18.6 % — ABNORMAL HIGH (ref 11.5–15.5)
WBC: 13.8 10*3/uL — ABNORMAL HIGH (ref 4.0–10.5)
nRBC: 0 % (ref 0.0–0.2)

## 2021-01-27 MED ORDER — SODIUM CHLORIDE 0.9 % IV SOLN
INTRAVENOUS | Status: DC | PRN
  Administered 2021-01-27 – 2021-01-28 (×2): 250 mL via INTRAVENOUS

## 2021-01-27 MED ORDER — FERROUS SULFATE 325 (65 FE) MG PO TABS: 325.0000 mg | ORAL_TABLET | Freq: Three times a day (TID) | ORAL | 1 refills | Status: AC

## 2021-01-27 NOTE — TOC Initial Note (Signed)
Transition of Care [facility]) - Initial/Assessment Note    Patient Details  Name: Devin Washington MRN: [id number] Date of Birth: [birth year]  Transition of Care [facility]) CM/SW Contact:    Elliot Gurney [place], [place] Phone Number: [phone] 01/27/2021, 11:58 AM  Clinical Narrative:                 Patient is a 87 year old male who lives alone and ambulates with a cane as well as a rolling walker.  He has a life alert as well. Patient presented to the [facility] ED via EMS  for evaluation of altered mental status. Patient to discharge home today. Patient has a granddaughter who will be checking on him regularly and he has a friend who will be staying with him in his home until his condition improves. Patient will discharge with [facility] services(PT) [facility] has agreed to accept patient. Patient's granddaughter will be transporting patient home today. Patient's PCP is Leotis Pain through [facility]. Patient uses [facility].   Transition of Care to sign off at this time.   Yazleen Molock, LCSW Transitions of Care [phone]     Expected Discharge Plan: [place] Barriers to Discharge: No Barriers Identified   Patient Goals and CMS Choice Patient states their goals for this hospitalization and ongoing recovery are:: To return back home CMS Medicare.gov Compare Post Acute Care list provided to:: Patient Choice offered to / list presented to : Patient  Expected Discharge Plan and Services Expected Discharge Plan: [place] In-house Referral: Clinical Social Work   Post Acute Care Choice: [place] arrangements for the past 2 months: [place] Expected Discharge Date: 01/27/21                         HH Arranged: PT [place] Agency: Other - See comment ([facility]) Date [place]: 01/27/21 Time HH Agency Contacted: 1000 Representative spoke with at [place]: teresa cooper  Prior Living  Arrangements/Services Living arrangements for the past 2 months: [place] with:: Self Patient language and need for interpreter reviewed:: Yes Do you feel safe going back to the place where you live?: Yes      Need for Family Participation in Patient Care: Yes (Comment) Care giver support system in place?: Yes (comment) Current home services: [facility] Criminal Activity/Legal Involvement Pertinent to Current Situation/Hospitalization: No - Comment as needed  Activities of Daily Living Home Assistive Devices/Equipment: Grab bars in shower,Communication device (specify type),Cane (specify quad or straight),Walker (specify type),Other (Comment),Shower chair with back,Dentures (specify type) (LifeAlert, front wheel walker, inhaler, upper and lower dentures) ADL Screening (condition at time of admission) Patient's cognitive ability adequate to safely complete daily activities?: Yes Is the patient deaf or have difficulty hearing?: Yes Does the patient have difficulty seeing, even when wearing glasses/contacts?: Yes Does the patient have difficulty concentrating, remembering, or making decisions?: No Patient able to express need for assistance with ADLs?: Yes Does the patient have difficulty dressing or bathing?: No Independently performs ADLs?: Yes (appropriate for developmental age) Does the patient have difficulty walking or climbing stairs?: Yes Weakness of Legs: Both Weakness of Arms/Hands: Both  Permission Sought/Granted   Permission granted to share information with : Yes, Verbal Permission Granted  Share Information with NAME: Zuhair Lariccia     Permission granted to share info w Relationship: granddaughter  Permission granted to share info w Contact Information: [phone]  Emotional Assessment   Attitude/Demeanor/Rapport: Gracious,Ambitious Affect (typically observed): Accepting Orientation: : Oriented to Self,Oriented to Place,Oriented to  Time,Oriented  to Situation Alcohol / Substance Use: Not Applicable Psych Involvement: No (comment)  Admission diagnosis:  Metabolic encephalopathy [J33.54] Lactic acidosis [E87.2] Left leg pain [M79.605] AKI (acute kidney injury) ([place]) [N17.9] Leg edema, left [R60.0] Severe sepsis ([place]) [A41.9, R65.20] Sepsis without acute organ dysfunction, due to unspecified organism [facility]) [A41.9] Patient Active Problem List   Diagnosis Date Noted  . Severe sepsis ([place]) 01/21/2021  . Swelling of limb 07/03/2020  . Peripheral artery disease ([place]) 01/09/2020  . Benign hypertensive kidney disease with chronic kidney disease 12/21/2019  . Proteinuria 12/21/2019  . Secondary hyperparathyroidism of renal origin ([place]) 12/21/2019  . Anemia in chronic kidney disease 12/21/2019  . Atrial fibrillation ([place]) 08/24/2019  . Bradycardia 08/24/2019  . Cardiomyopathy ([place]) 08/24/2019  . CHF (congestive heart failure) ([place]) 08/24/2019  . Sick sinus syndrome ([place]) 08/24/2019  . SOB (shortness of breath) 08/24/2019  . Age-related osteoporosis with current pathological fracture with routine healing 10/08/2018  . Wheelchair bound 10/08/2018  . Pubic ramus fracture ([place]) 10/03/2018  . Acute blood loss anemia 05/28/2018  . Iron deficiency anemia 04/09/2018  . Symptomatic anemia 04/02/2018  . 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 Hypertension 11/13/2017  . 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 Protein-calorie malnutrition, severe ([place]) 08/08/2015  . Abdominal pain, epigastric 08/06/2015  . Nausea and vomiting 08/06/2015  . HTN (hypertension) 08/06/2015  . Hypercholesteremia 08/06/2015  . Back pain 08/06/2015  . CKD (chronic kidney disease), stage III ([place]) 08/06/2015  . Abdominal pain 08/06/2015   PCP:  Albina Billet, MD Pharmacy:   [place], [place] - [address] [place] [address] Phone: [phone] Fax: [phone]     Social Determinants of Health  (SDOH) Interventions    Readmission Risk Interventions Readmission Risk Prevention Plan 01/27/2021  Transportation Screening Complete  PCP or Specialist Appt within 5-7 Days Complete  Home Care Screening Complete  Medication Review (RN CM) Complete  Some recent data might be hidden

## 2021-01-27 NOTE — Discharge Summary (Signed)
Physician Discharge Summary  Devin Washington KGM:[id number] DOB: 1923-07-24 DOA: 01/21/2021  PCP: Albina Billet, MD  Admit date: 01/21/2021 Discharge date: 01/27/2021  Admitted From: Home Disposition: Home  Recommendations for Outpatient Follow-up:  1. Follow up with PCP in 1-2 weeks 2. Please obtain BMP/CBC in one week 3. Please follow up with GI as scheduled  Home Health: Home health PT Equipment/Devices: None  Discharge Condition: Stable CODE STATUS: DNR Diet recommendation: As tolerated  Brief/Interim Summary: Devin Washington is a 87 y.o. male with medical history significant foriron deficiency anemia, history of erosive esophagitis, history of peptic ulcer disease, hypertension, status post pacemaker insertion for sick sinus syndrome, peripheral vascular disease who was brought to the hospital because of altered mental status.  He lives at home alone.  He has been having dark stools at home.  He has chronic anemia and he goes to the  infusion center for periodic blood transfusions. He was hypothermic, hypoxic and hypotensive on admission.  Lactic acid was significantly elevated (greater than 11). He was admitted to the hospital for severe sepsis.  He was also found to have severe anemia with hemoglobin of 6.  Patient admitted as above with acute severe sepsis secondary to enterocolitis that resolved quite quickly.  Patient also has moderately profound chronic iron deficiency followed closely by heme-onc receiving Retacrit and transfusions as well as iron in the outpatient setting.  At this time patient's hemoglobin is somewhat stable after previous transfusion, have increased his outpatient iron but will need close follow-up to ensure no worsening anemia in the next few days.  Patient otherwise stable and agreeable for discharge home, evaluation with GI was essentially unremarkable for acute bleeding but did show signs of gastropathy and recent bleeding which may also be exacerbating his  anemia.  Follow-up with GI as scheduled.  Discharge Diagnoses:  Principal Problem:   Severe sepsis ([place]) Active Problems:   CKD (chronic kidney disease), stage III (HCC)   Hypertension   Symptomatic anemia   CHF (congestive heart failure) ([place])   Peripheral artery disease (HCC)   Anemia in chronic kidney disease    Discharge Instructions  Discharge Instructions    Call MD for:  extreme fatigue   Complete by: As directed    Call MD for:  persistant dizziness or light-headedness   Complete by: As directed    Diet general   Complete by: As directed    Increase activity slowly   Complete by: As directed      Allergies as of 01/27/2021   No Known Allergies     Medication List    STOP taking these medications   amLODipine 10 MG tablet Commonly known as: NORVASC   aspirin 81 MG EC tablet   metoprolol tartrate 25 MG tablet Commonly known as: LOPRESSOR     TAKE these medications   Anoro Ellipta 62.5-25 MCG/INH Aepb Generic drug: umeclidinium-vilanterol Inhale 1 puff into the lungs 2 (two) times daily as needed.   feeding supplement Liqd Take 1 Bottle by mouth daily.   ferrous sulfate 325 (65 FE) MG tablet Take 1 tablet (325 mg total) by mouth 3 (three) times daily with meals. What changed: when to take this   furosemide 20 MG tablet Commonly known as: LASIX Take 1 tablet by mouth daily.   simvastatin 40 MG tablet Commonly known as: ZOCOR Take 40 mg by mouth daily.   traMADol 50 MG tablet Commonly known as: ULTRAM Take 1 tablet (50 mg total) by mouth  every 6 (six) hours as needed for moderate pain or severe pain.   traZODone 50 MG tablet Commonly known as: DESYREL Take 1 tablet by mouth at bedtime as needed for sleep.       No Known Allergies  Consultations:  GI   Procedures/Studies: CT ABDOMEN PELVIS WO CONTRAST  Result Date: 01/21/2021 CLINICAL DATA:  Nonlocalized abdominal pain EXAM: CT ABDOMEN AND PELVIS WITHOUT CONTRAST TECHNIQUE:  Multidetector CT imaging of the abdomen and pelvis was performed following the standard protocol without IV contrast. COMPARISON:  CT pelvis 10/03/2018, CT angio abdomen and pelvis 07/14/2016, chest radiograph 01/21/2021 FINDINGS: Lower chest: Subpleural reticular changes are bronchitic features in the lung bases which are similar to comparison exam and may reflect some underlying interstitial lung disease. No focal airspace disease or pleural effusion. Few scattered tiny sub 2 mm micro nodules are present in both lung bases. Cardiac size is top normal. Calcification of the mitral annulus and chordae tendinae a as well as the aortic leaflets. Hypoattenuation of the cardiac blood pool compatible with anemia. Minimal pericardial thickening towards the apex, nonspecific though could correlate for history of prior infarct or pericardial infection/inflammation. Terminus of pacer/defibrillator leads noted at the right atrium and cardiac apex. Hepatobiliary: No visible focal liver lesion within limitations of this unenhanced exam. Smooth liver surface contour. Prominent fold in the otherwise unremarkable gallbladder. No visible calcified gallstones or biliary ductal dilatation. Pancreas: No pancreatic ductal dilatation or surrounding inflammatory changes. Spleen: Normal in size. No concerning splenic lesions. Adrenals/Urinary Tract: Lobular thickening of the adrenal glands without concerning dominant nodule is similar to comparison studies. Mild bilateral renal atrophy. Multiple cortical cysts are present in both kidneys. Slightly more indeterminate hyperdense 7 mm partially exophytic cystic lesion is seen arising from the lower pole left kidney (2/27). No urolithiasis or hydronephrosis. Urinary bladder is largely decompressed at the time of exam and therefore poorly evaluated by CT imaging. No gross abnormality is clearly evident. Stomach/Bowel: Small sliding-type hiatal hernia. Stomach distended with air and ingested  material. Duodenum with a normal sweep across the midline abdomen. Small fluid-filled duodenal diverticulum noted near the second portion of the duodenum (2/24) without adjacent inflammation. Much of the small bowel and colon are diffusely fluid-filled. Some this slight hazy stranding is noted around several of the bowel loops most prominently the terminal ileum and distal rectosigmoid. No evidence of bowel obstruction. Appendix is not visualized. No focal inflammation the vicinity of the cecum to suggest an occult appendicitis. Vascular/Lymphatic: Atherosclerotic calcifications within the abdominal aorta and branch vessels. No aneurysm or ectasia. Redemonstration of an infrarenal abdominal aortic aneurysm now measuring up to 3 cm in size, minimally increased from prior. A right common iliac artery aneurysm measuring up to 2.8 cm in size is similar to prior. No enlarged abdominopelvic lymph nodes. Reproductive: Surgical changes in the deep pelvis likely reflect prior prostatectomy. Likely trace bilateral hydrocele. No other acute abnormality included external genitalia. Other: Diffuse circumferential body wall edema. Mild edematous changes in the central mesentery as well. No abdominopelvic free air or fluid. No bowel containing hernias. Musculoskeletal: The osseous structures appear diffusely demineralized which may limit detection of small or nondisplaced fractures. Multilevel degenerative changes are present in the imaged portions of the spine. Interspinous arthrosis suggest Baastrup's disease noted as well. Fairly significant disc bulging and protrusions with ligamentum flavum infolding result in some moderate to severe canal stenosis and foraminal narrowing L3-S1. IMPRESSION: 1. Much of the small bowel and colon are diffusely fluid-filled. Findings are  nonspecific, but can be seen with an enterocolitis of infectious or inflammatory etiology. No evidence of bowel obstruction. 2. Minimal interval increase in size  of an infrarenal abdominal aortic aneurysm now measuring up to 3 cm in size. Stable right common iliac artery aneurysm measuring up to 2.8 cm in size. Recommend follow-up ultrasound every 3 years. This recommendation follows ACR consensus guidelines: White Paper of the ACR Incidental Findings Committee II on Vascular Findings. J Am Coll Radiol 2013; 10:789-794. 3. Indeterminate hyperdense 7 mm partially exophytic cystic lesion arising from the lower pole left kidney. Recommend further evaluation with nonemergent renal ultrasound. 4. Subpleural reticular changes in the lung bases are similar to comparison exam and may reflect some underlying interstitial lung disease. Additional scattered basilar micro nodules. No follow-up needed if patient is low-risk (and has no known or suspected primary neoplasm). Non-contrast chest CT can be considered in 12 months if patient is high-risk. This recommendation follows the consensus statement: Guidelines for Management of Incidental Pulmonary Nodules Detected on CT Images: From the Fleischner Society 2017; Radiology 2017; 284:228-243. 5. Hypoattenuation of the cardiac blood pool compatible with anemia. 6. Minimal pericardial thickening towards the apex, nonspecific though could correlate for history of prior infarct or pericardial infection/inflammation. 7. Multilevel degenerative changes of the imaged spine with moderate to severe canal stenosis and foraminal narrowing L3-S1. 8. Aortic Atherosclerosis (ICD10-I70.0). Electronically Signed   By: Lovena Le M.D.   On: 01/21/2021 17:12   CT Head Wo Contrast  Result Date: 01/21/2021 CLINICAL DATA:  Fall, altered mental status, dual anti-platelet therapy EXAM: CT HEAD WITHOUT CONTRAST TECHNIQUE: Contiguous axial images were obtained from the base of the skull through the vertex without intravenous contrast. COMPARISON:  04/01/2018 FINDINGS: Brain: Normal anatomic configuration. Parenchymal volume loss is commensurate with the  patient's age. Mild periventricular white matter changes are present likely reflecting the sequela of small vessel ischemia. No abnormal intra or extra-axial mass lesion or fluid collection. No abnormal mass effect or midline shift. No evidence of acute intracranial hemorrhage or infarct. Ventricular size is mildly enlarged, commensurate with the degree of cerebral atrophy. Cerebellum unremarkable. Vascular: No asymmetric hyperdense vasculature at the skull base. Extensive intracranial atherosclerotic calcification is seen within the internal carotid arteries. Skull: Intact Sinuses/Orbits: Paranasal sinuses are clear. Orbits are unremarkable. Other: Mastoid air cells and middle ear cavities are clear. IMPRESSION: No acute intracranial abnormality.  No calvarial fracture. Mild to moderate senescent change. Electronically Signed   By: Fidela Salisbury MD   On: 01/21/2021 11:41   US Venous Img Lower Unilateral Left (DVT)  Result Date: 01/21/2021 CLINICAL DATA:  Left lower extremity pain and edema. History left popliteal artery aneurysm and prior embolization distal native left SFA and prior left femoral to peroneal artery bypass. EXAM: LEFT LOWER EXTREMITY VENOUS DOPPLER ULTRASOUND TECHNIQUE: Gray-scale sonography with graded compression, as well as color Doppler and duplex ultrasound were performed to evaluate the lower extremity deep venous systems from the level of the common femoral vein and including the common femoral, femoral, profunda femoral, popliteal and calf veins including the posterior tibial, peroneal and gastrocnemius veins when visible. The superficial great saphenous vein was also interrogated. Spectral Doppler was utilized to evaluate flow at rest and with distal augmentation maneuvers in the common femoral, femoral and popliteal veins. COMPARISON:  None. FINDINGS: Contralateral Common Femoral Vein: Respiratory phasicity is normal and symmetric with the symptomatic side. No evidence of thrombus.  Normal compressibility. Common Femoral Vein: No evidence of thrombus. Normal compressibility, respiratory  phasicity and response to augmentation. Saphenofemoral Junction: No evidence of thrombus. Normal compressibility and flow on color Doppler imaging. Profunda Femoral Vein: No evidence of thrombus. Normal compressibility and flow on color Doppler imaging. Femoral Vein: No evidence of thrombus. Normal compressibility, respiratory phasicity and response to augmentation. Popliteal Vein: The popliteal vein was not adequately visualized due to overlying hypoechoic structure that does not contain blood flow. This presumably relates to thrombus in a thrombosed popliteal artery aneurysm. Mass effect of this thrombosed aneurysm on the popliteal vein cannot be excluded. Calf Veins: The posterior tibial vein is visualized and patent. The peroneal vein is not visualized. Superficial Great Saphenous Vein: No evidence of thrombus. Normal compressibility. Venous Reflux:  None. Other Findings:  No evidence of superficial thrombophlebitis. IMPRESSION: 1. No visualized left lower extremity deep vein thrombosis. 2. 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 Mass effect of this thrombosed aneurysm on the popliteal vein cannot be excluded. Electronically Signed   By: Aletta Edouard M.D.   On: 01/21/2021 16:51   DG Chest Port 1 View  Result Date: 01/22/2021 CLINICAL DATA:  Hypoxia EXAM: PORTABLE CHEST 1 VIEW COMPARISON:  01/21/2021 FINDINGS: Cardiac shadow is stable. Pacing device is again seen. Aortic calcifications are noted. Lungs are well aerated bilaterally. No focal infiltrate or effusion is seen. No acute bony abnormality is noted. IMPRESSION: No active disease. Electronically Signed   By: Inez Catalina M.D.   On: 01/22/2021 16:18   DG Chest Port 1 View  Result Date: 01/21/2021 CLINICAL DATA:  Altered mental status. EXAM:  PORTABLE CHEST 1 VIEW COMPARISON:  04/03/2018 FINDINGS: Stable heart size and positioning of biventricular pacemaker. There is no evidence of pulmonary edema, consolidation, pneumothorax, nodule or pleural fluid. Visualized bony structures are unremarkable. IMPRESSION: No active disease. Electronically Signed   By: Aletta Edouard M.D.   On: 01/21/2021 11:07   VAS [place] LOWER EXTREMITY ARTERIAL DUPLEX  Result Date: 01/04/2021 LOWER EXTREMITY ARTERIAL DUPLEX STUDY Indications: Peripheral artery disease.  Vascular Interventions: 12/16/2017 Left CFA to peroneal vein graft with multiple                         coil embolization .                         03/04/2018 PTA and revasc of distal graft.                         12/17/2018Right sfa to pop stent placement thru                         popliteal aneurysm. Current ABI:            Not Obtained Comparison Study: 07/03/2020 Performing Technologist: Almira Coaster RVS  Examination Guidelines: A complete evaluation includes B-mode imaging, spectral Doppler, color Doppler, and power Doppler as needed of all accessible portions of each vessel. Bilateral testing is considered an integral part of a complete examination. Limited examinations for reoccurring indications may be performed as noted.  +-----------+--------+-----+--------+----------+--------+ RIGHT      PSV cm/sRatioStenosisWaveform  Comments +-----------+--------+-----+--------+----------+--------+ CFA Distal 69                   biphasic           +-----------+--------+-----+--------+----------+--------+ DFA  34                   biphasic           +-----------+--------+-----+--------+----------+--------+ SFA Prox   26                   biphasic           +-----------+--------+-----+--------+----------+--------+ SFA Mid    20                   biphasic           +-----------+--------+-----+--------+----------+--------+ SFA Distal 27                   biphasic            +-----------+--------+-----+--------+----------+--------+ POP Distal 21                   biphasic           +-----------+--------+-----+--------+----------+--------+ ATA Distal 34                   monophasic         +-----------+--------+-----+--------+----------+--------+ PTA Distal 49                   monophasic         +-----------+--------+-----+--------+----------+--------+ PERO Distal28                   monophasic         +-----------+--------+-----+--------+----------+--------+  +-----------------+-----------------------+--------------+ Right Diameter(s)Anterior/Posterial (cm)Transverse(cm) +-----------------+-----------------------+--------------+ Popliteal        2.90                   4.30           +-----------------+-----------------------+--------------+  +----------+--------+-----+--------+----------+--------+ LEFT      PSV cm/sRatioStenosisWaveform  Comments +----------+--------+-----+--------+----------+--------+ CFA Distal46                   biphasic           +----------+--------+-----+--------+----------+--------+ DFA       64                   triphasic          +----------+--------+-----+--------+----------+--------+ SFA Prox  20                   monophasic         +----------+--------+-----+--------+----------+--------+ SFA Mid   0                    Occluded           +----------+--------+-----+--------+----------+--------+ SFA Distal0                    Occluded           +----------+--------+-----+--------+----------+--------+  +-----------------+-----------------------+---------------+ Left Diameter(s):Anterior/Posterior (cm)Transverse (cm) +-----------------+-----------------------+---------------+ Popliteal        4.10                   4.20            +-----------------+-----------------------+---------------+  Left Graft #1: Lower Extremity Thigh  +--------------------+--------+--------+----------+--------+                     PSV cm/sStenosisWaveform  Comments +--------------------+--------+--------+----------+--------+ Inflow              46              biphasic           +--------------------+--------+--------+----------+--------+  Proximal Anastomosis47              biphasic           +--------------------+--------+--------+----------+--------+ Proximal Graft      46              monophasic         +--------------------+--------+--------+----------+--------+ Mid Graft           51              monophasic         +--------------------+--------+--------+----------+--------+ Distal Graft        52              monophasic         +--------------------+--------+--------+----------+--------+ Distal Anastomosis  45              monophasic         +--------------------+--------+--------+----------+--------+ Outflow                                                +--------------------+--------+--------+----------+--------+   Summary: Right: Imaging and Waveforms obtained throughout in the Right Lower Extremity. Biphasic Waveforms seen predominantly in the Right Lower Extremity. Monophasic Tibial and Peroneal Artery Flow. Left: Imaging and Waveforms obtained throughout in the Left Lower Extremity. The Left Lower Extremity BPG appears to be patent throughout.  See table(s) above for measurements and observations. Electronically signed by Leotis Pain MD on 01/04/2021 at 11:34:10 AM.    Final    VAS US AORTA/IVC/ILIACS  Result Date: 01/04/2021 ABDOMINAL AORTA STUDY Indications: Follow up exam for known AAA. Vascular Interventions: 12/16/2017 Left CFA to peroneal vein graft with multiple                         coil embolization .                         03/04/2018 PTA and revasc of distal graft.                         12/17/2018Right sfa to pop stent placement thru                         popliteal aneurysm.  Comparison  Study: 01/04/2020 Performing Technologist: Almira Coaster RVS  Examination Guidelines: A complete evaluation includes B-mode imaging, spectral Doppler, color Doppler, and power Doppler as needed of all accessible portions of each vessel. Bilateral testing is considered an integral part of a complete examination. Limited examinations for reoccurring indications may be performed as noted.  Abdominal Aorta Findings: +-------------+-------+----------+----------+----------+--------+--------+ Location     AP (cm)Trans (cm)PSV (cm/s)Waveform  ThrombusComments +-------------+-------+----------+----------+----------+--------+--------+ Proximal     1.75   1.80      67        monophasic                 +-------------+-------+----------+----------+----------+--------+--------+ Mid          1.81   1.86      74        monophasic                 +-------------+-------+----------+----------+----------+--------+--------+ Distal       3.00   3.40      100  biphasic                   +-------------+-------+----------+----------+----------+--------+--------+ RT CIA Prox  1.5    1.5       91        biphasic                   +-------------+-------+----------+----------+----------+--------+--------+ RT CIA Distal1.2    1.3       68        biphasic                   +-------------+-------+----------+----------+----------+--------+--------+ RT EIA Prox  1.0    1.1       79        biphasic                   +-------------+-------+----------+----------+----------+--------+--------+ RT EIA Distal1.2    1.3       80        biphasic                   +-------------+-------+----------+----------+----------+--------+--------+ LT CIA Prox  1.2    1.1       70        biphasic                   +-------------+-------+----------+----------+----------+--------+--------+ LT CIA Distal1.1    1.1       92        biphasic                    +-------------+-------+----------+----------+----------+--------+--------+ LT EIA Prox  1.2    1.2       72        triphasic                  +-------------+-------+----------+----------+----------+--------+--------+ LT EIA Distal1.0    1.1       59        biphasic                   +-------------+-------+----------+----------+----------+--------+--------+  Summary: Abdominal Aorta: There is evidence of abnormal dilatation of the distal Abdominal aorta. There is evidence of abnormal dilation of the Right Common Iliac artery. The largest aortic measurement is 3.4 cm. The largest aortic diameter has increased compared  to prior exam. Previous diameter measurement was 3.1 cm obtained on 01/04/2020.  *See table(s) above for measurements and observations.  Electronically signed by Leotis Pain MD on 01/04/2021 at 11:34:16 AM.    Final      Subjective: No acute issues or events overnight denies nausea vomiting diarrhea constipation headache fevers or chills   Discharge Exam: Vitals:   01/27/21 0341 01/27/21 0803  BP: (!) 143/79 (!) 153/83  Pulse: 97 85  Resp: 18 18  Temp: 99 F (37.2 C) (!) 97.5 F (36.4 C)  SpO2: 100% 100%   Vitals:   01/26/21 1700 01/26/21 1934 01/27/21 0341 01/27/21 0803  BP: 136/76 122/73 (!) 143/79 (!) 153/83  Pulse: 86 (!) 105 97 85  Resp: 19 16 18 18   Temp: 98.4 F (36.9 C) 98.6 F (37 C) 99 F (37.2 C) (!) 97.5 F (36.4 C)  TempSrc: Oral Oral Oral Oral  SpO2: 94% 99% 100% 100%  Weight:   78.4 kg   Height:        General: Pt is alert, awake, not in acute distress Cardiovascular: RRR, S1/S2 +, no rubs, no gallops Respiratory: CTA bilaterally, no wheezing, no rhonchi  Abdominal: Soft, NT, ND, bowel sounds + Extremities: no edema, no cyanosis    The results of significant diagnostics from this hospitalization (including imaging, microbiology, ancillary and laboratory) are listed below for reference.     Microbiology: Recent Results (from the  past 240 hour(s))  Blood culture (routine single)     Status: None   Collection Time: 01/21/21 10:55 AM   Specimen: BLOOD RIGHT FOREARM  Result Value Ref Range Status   Specimen Description BLOOD RIGHT FOREARM  Final   Special Requests   Final    BOTTLES DRAWN AEROBIC AND ANAEROBIC Blood Culture adequate volume   Culture   Final    NO GROWTH 5 DAYS Performed at [name], [address] [place], [place] [address]    Report Status 01/26/2021 FINAL  Final  Urine culture     Status: None   Collection Time: 01/21/21 10:55 AM   Specimen: In/Out Cath Urine  Result Value Ref Range Status   Specimen Description   Final    IN/OUT CATH URINE Performed at [facility], [address], [place], [place] [address]    Special Requests   Final    NONE Performed at [facility], [address] [place], [place] [address]    Culture   Final    NO GROWTH Performed at [facility], [place] [address] [place], [place] [address]    Report Status 01/22/2021 FINAL  Final  Blood culture (single)     Status: None   Collection Time: 01/21/21 10:55 AM   Specimen: BLOOD  Result Value Ref Range Status   Specimen Description BLOOD LEFT [facility]  Final   Special Requests   Final    BOTTLES DRAWN AEROBIC AND ANAEROBIC Blood Culture adequate volume   Culture   Final    NO GROWTH 5 DAYS Performed at [facility], [address] [place], [place] [address]    Report Status 01/26/2021 FINAL  Final     Labs: BNP (last 3 results) No results for input(s): BNP in the last 8760 hours. Basic Metabolic Panel: Recent Labs  Lab 01/23/21 0615 01/24/21 0536 01/25/21 0442 01/26/21 0454 01/27/21 0500  NA 140 139 140 140 138  K 3.7 3.6 3.7 3.4* 3.4*  CL 109 106 108 107 108  CO2 20* 21* 24 24 23   GLUCOSE 107* 99 101* 93 116*  BUN 49* 41* 38* 34* 36*  CREATININE 2.42* 2.09* 2.04* 2.04* 1.95*  CALCIUM 8.3* 8.5* 8.4* 8.3* 7.9*   Liver Function Tests: Recent Labs   Lab 01/21/21 1024 01/25/21 0442 01/26/21 0454 01/27/21 0500  AST 67* 33 25 19  ALT 35 28 22 20   ALKPHOS 51 42 42 42  BILITOT 0.8 1.1 1.0 0.9  PROT 5.6* 4.9* 4.9* 4.8*  ALBUMIN 2.7* 2.5* 2.3* 2.4*   No results for input(s): LIPASE, AMYLASE in the last 168 hours. No results for input(s): AMMONIA in the last 168 hours. CBC: Recent Labs  Lab 01/21/21 1024 01/22/21 1246 01/23/21 1427 01/24/21 0536 01/25/21 0442 01/26/21 0454 01/27/21 0500  WBC 15.6* 18.0*  --  20.8* 14.8* 12.5* 13.8*  NEUTROABS 13.6* 16.2*  --  18.7*  --   --   --   HGB 6.0* 5.1* 5.3* 8.2* 7.8* 7.5* 7.3*  HCT 20.7* 15.8* 16.1* 25.3* 23.1* 22.9* 22.8*  MCV 93.2 85.4  --  89.1 87.2 89.8 91.2  PLT 89* 120*  --  157 177 197 210   Cardiac Enzymes: Recent  Labs  Lab 01/22/21 0618  CKTOTAL 837*   BNP: Invalid input(s): POCBNP CBG: No results for input(s): GLUCAP in the last 168 hours. D-Dimer No results for input(s): DDIMER in the last 72 hours. Hgb A1c No results for input(s): HGBA1C in the last 72 hours. Lipid Profile No results for input(s): CHOL, HDL, LDLCALC, TRIG, CHOLHDL, LDLDIRECT in the last 72 hours. Thyroid function studies No results for input(s): TSH, T4TOTAL, T3FREE, THYROIDAB in the last 72 hours.  Invalid input(s): FREET3 Anemia work up No results for input(s): VITAMINB12, FOLATE, FERRITIN, TIBC, IRON, RETICCTPCT in the last 72 hours. Urinalysis    Component Value Date/Time   COLORURINE YELLOW (A) 01/21/2021 1055   APPEARANCEUR HAZY (A) 01/21/2021 1055   LABSPEC 1.016 01/21/2021 1055   PHURINE 5.0 01/21/2021 1055   GLUCOSEU 50 (A) 01/21/2021 1055   HGBUR LARGE (A) 01/21/2021 1055   BILIRUBINUR NEGATIVE 01/21/2021 1055   KETONESUR NEGATIVE 01/21/2021 1055   PROTEINUR 100 (A) 01/21/2021 1055   NITRITE NEGATIVE 01/21/2021 1055   LEUKOCYTESUR NEGATIVE 01/21/2021 1055   Sepsis Labs Invalid input(s): PROCALCITONIN,  WBC,  LACTICIDVEN Microbiology Recent Results (from the past 240  hour(s))  Blood culture (routine single)     Status: None   Collection Time: 01/21/21 10:55 AM   Specimen: BLOOD RIGHT FOREARM  Result Value Ref Range Status   Specimen Description BLOOD RIGHT FOREARM  Final   Special Requests   Final    BOTTLES DRAWN AEROBIC AND ANAEROBIC Blood Culture adequate volume   Culture   Final    NO GROWTH 5 DAYS Performed at [facility], [address], [place], [place] [address]    Report Status 01/26/2021 FINAL  Final  Urine culture     Status: None   Collection Time: 01/21/21 10:55 AM   Specimen: In/Out Cath Urine  Result Value Ref Range Status   Specimen Description   Final    IN/OUT CATH URINE Performed at [facility], [address] [place], [place] [address]    Special Requests   Final    NONE Performed at [facility], [address] [place], [place] [address]    Culture   Final    NO GROWTH Performed at [facility], [place] [address] [place],  [address]    Report Status 01/22/2021 FINAL  Final  Blood culture (single)     Status: None   Collection Time: 01/21/21 10:55 AM   Specimen: BLOOD  Result Value Ref Range Status   Specimen Description BLOOD LEFT [facility]  Final   Special Requests   Final    BOTTLES DRAWN AEROBIC AND ANAEROBIC Blood Culture adequate volume   Culture   Final    NO GROWTH 5 DAYS Performed at [facility], [address] [place],  [address]    Report Status 01/26/2021 FINAL  Final     Time coordinating discharge: Over 30 minutes  SIGNED:   Little Ishikawa, DO [facility] 01/27/2021, 1:23 PM Pager   If 7PM-7AM, please contact night-coverage [email or web address]

## 2021-01-27 NOTE — NC FL2 (Signed)
[place] LEVEL OF CARE SCREENING TOOL     IDENTIFICATION  Patient Name: Devin Washington Birthdate: 10-Mar-1923 Sex: male Admission Date (Current Location): 01/21/2021  [place] and [place] Number:  Engineering geologist and Address:  [facility], [address], [place], [place] [address]      Provider Number: [id number]  Attending Physician Name and Address:  Little Ishikawa, MD  Relative Name and Phone Number:  Devin Washington [phone]    Current Level of Care: SNF Recommended Level of Care: [place] Prior Approval Number:    Date Approved/Denied:   PASRR Number: [id number]  Discharge Plan: SNF    Current Diagnoses: Patient Active Problem List   Diagnosis Date Noted  . Severe sepsis ([place]) 01/21/2021  . Swelling of limb 07/03/2020  . Peripheral artery disease ([place]) 01/09/2020  . Benign hypertensive kidney disease with chronic kidney disease 12/21/2019  . Proteinuria 12/21/2019  . Secondary hyperparathyroidism of renal origin ([place]) 12/21/2019  . Anemia in chronic kidney disease 12/21/2019  . Atrial fibrillation ([place]) 08/24/2019  . Bradycardia 08/24/2019  . Cardiomyopathy ([place]) 08/24/2019  . CHF (congestive heart failure) ([place]) 08/24/2019  . Sick sinus syndrome ([place]) 08/24/2019  . SOB (shortness of breath) 08/24/2019  . Age-related osteoporosis with current pathological fracture with routine healing 10/08/2018  . Wheelchair bound 10/08/2018  . Pubic ramus fracture ([place]) 10/03/2018  . Acute blood loss anemia 05/28/2018  . Iron deficiency anemia 04/09/2018  . Symptomatic anemia 04/02/2018  . 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 Hypertension 11/13/2017  . 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 Protein-calorie malnutrition, severe ([place]) 08/08/2015  . Abdominal pain, epigastric 08/06/2015  . Nausea and vomiting 08/06/2015  . HTN (hypertension) 08/06/2015  . Hypercholesteremia  08/06/2015  . Back pain 08/06/2015  . CKD (chronic kidney disease), stage III ([place]) 08/06/2015  . Abdominal pain 08/06/2015    Orientation RESPIRATION BLADDER Height & Weight     Self,Time,Situation,Place  Normal Continent Weight: 172 lb 14.4 oz (78.4 kg) Height:  5' 10.5" (179.1 cm)  BEHAVIORAL SYMPTOMS/MOOD NEUROLOGICAL BOWEL NUTRITION STATUS      Continent Diet  AMBULATORY STATUS COMMUNICATION OF NEEDS Skin   Limited Assist Verbally Normal                       Personal Care Assistance Level of Assistance  Bathing,Feeding,Dressing Bathing Assistance: Limited assistance Feeding assistance: Limited assistance Dressing Assistance: Limited assistance     Functional Limitations Info  Sight,Hearing,Speech Sight Info: Adequate Hearing Info: Adequate Speech Info: Adequate    SPECIAL CARE FACTORS FREQUENCY  PT (By licensed PT),OT (By licensed OT)     PT Frequency: 5x per week OT Frequency: 5x per week            Contractures Contractures Info: Not present    Additional Factors Info  Code Status Code Status Info: full code             Current Medications (01/27/2021):  This is the current hospital active medication list Current Facility-Administered Medications  Medication Dose Route Frequency Provider Last Rate Last Admin  . 0.9 %  sodium chloride infusion (Manually program via Guardrails IV Fluids)   Intravenous Once Jennye Boroughs, MD      . acetaminophen (TYLENOL) tablet 650 mg  650 mg Oral Q6H PRN Agbata, Tochukwu, MD       Or  . acetaminophen (TYLENOL) suppository 650 mg  650 mg Rectal Q6H PRN  Collier Bullock, MD      . acetaminophen (TYLENOL) tablet 325 mg  325 mg Oral BID Agbata, Tochukwu, MD   325 mg at 01/27/21 0851  . albuterol (VENTOLIN HFA) 108 (90 Base) MCG/ACT inhaler 2 puff  2 puff Inhalation Q6H PRN Agbata, Tochukwu, MD   2 puff at 01/23/21 1423  . ceFEPIme (MAXIPIME) 2 g in sodium chloride 0.9 % 100 mL IVPB  2 g Intravenous Q24H Dorothe Pea, RPH 200 mL/hr at 01/26/21 1324 2 g at 01/26/21 1324  . feeding supplement (ENSURE ENLIVE / ENSURE PLUS) liquid 237 mL  1 Bottle Oral Q24H Agbata, Tochukwu, MD   237 mL at 01/24/21 1352  . ferrous sulfate tablet 325 mg  325 mg Oral Q breakfast Agbata, Tochukwu, MD   325 mg at 01/27/21 0851  . furosemide (LASIX) tablet 20 mg  20 mg Oral Daily Jennye Boroughs, MD   20 mg at 01/27/21 0851  . iohexol (OMNIPAQUE) 9 MG/ML oral solution 500 mL  500 mL Oral Once PRN Agbata, Tochukwu, MD      . metroNIDAZOLE (FLAGYL) IVPB 500 mg  500 mg Intravenous Q8H Agbata, Tochukwu, MD 100 mL/hr at 01/27/21 0632 500 mg at 01/27/21 9532  . ondansetron (ZOFRAN) tablet 4 mg  4 mg Oral Q6H PRN Agbata, Tochukwu, MD       Or  . ondansetron (ZOFRAN) injection 4 mg  4 mg Intravenous Q6H PRN Agbata, Tochukwu, MD      . pantoprazole (PROTONIX) injection 40 mg  40 mg Intravenous Q12H Gerarda Gunther M, PA-C   40 mg at 01/27/21 0233  . traZODone (DESYREL) tablet 50 mg  50 mg Oral QHS PRN Agbata, Tochukwu, MD      . umeclidinium-vilanterol (ANORO ELLIPTA) 62.5-25 MCG/INH 1 puff  1 puff Inhalation BID PRN Agbata, Tochukwu, MD   1 puff at 01/23/21 1338  . vitamin B-12 (CYANOCOBALAMIN) tablet 1,000 mcg  1,000 mcg Oral Daily Agbata, Tochukwu, MD   1,000 mcg at 01/27/21 4356     Discharge Medications: Please see discharge summary for a list of discharge medications.  Relevant Imaging Results:  Relevant Lab Results:   Additional Information Soc Sec # [id number]  Devin Washington [place], [name]

## 2021-01-27 NOTE — TOC Progression Note (Signed)
Transition of Care [facility]) - Progression Note    Patient Details  Name: Devin Washington MRN: [id number] Date of Birth: 1923-03-06  Transition of Care [facility]) CM/SW Contact  Devin Washington, Devin Washington Phone Number: [phone] 01/27/2021, 5:14 PM  Clinical Narrative:    Phone call to Devin Washington-patient's granddaughter to discuss patient's discharge plan. Patient's granddaughter confirmed that patient's friend is not able to provide the recommended 24 hour supervision and is agreeable to SNF. SNF process discussed as well as back up options  including private duty care if patient does not discharge to a SNF.  Patient's granddaughter to continue to explore options for increasing patient's supervision post discharge from SNF    Transition of Care to continue to follow   [facility], LCSW Transitions of Care [phone]     Expected Discharge Plan: [place] Barriers to Discharge: No Barriers Identified  Expected Discharge Plan and Services Expected Discharge Plan: [place] In-house Referral: Clinical Social Work   Post Acute Care Choice: [place] arrangements for the past 2 months: [place] Expected Discharge Date: 01/27/21                         HH Arranged: PT [place] Agency: Other - See comment ([facility]) Date [place]: 01/27/21 Time HH Agency Contacted: 1000 Representative spoke with at [place]: Devin Washington   Social Determinants of Health ([place]) Interventions    Readmission Risk Interventions Readmission Risk Prevention Plan 01/27/2021  Transportation Screening Complete  PCP or Specialist Appt within 5-7 Days Complete  Home Care Screening Complete  Medication Review (RN CM) Complete  Some recent data might be hidden

## 2021-01-27 NOTE — TOC Progression Note (Signed)
Transition of Care [facility]) - Progression Note    Patient Details  Name: Devin Washington MRN: [id number] Date of Birth: April 07, 1923  Transition of Care [facility]) CM/SW Contact  Elliot Gurney [place], [place] Phone Number: [phone] 01/27/2021, 3:12 PM  Clinical Narrative:    Patient's friend now unable to provide 24 hour care and will not be staying with patient as planned. Granddaughter concerned as she works and is not able to provide 24 hour supervision as well. She is now requesting SNF. Patient agreeable. FL2 completed and faxed out to local facilities for potential bed offers.  Transition of Care to continue to follow for discharge planning needs.  Ondria Oswald, LCSW Transitions of Care [phone]      Expected Discharge Plan: [place] Barriers to Discharge: No Barriers Identified  Expected Discharge Plan and Services Expected Discharge Plan: [place] In-house Referral: Clinical Social Work   Post Acute Care Choice: [place] arrangements for the past 2 months: [place] Expected Discharge Date: 01/27/21                         HH Arranged: PT [place] Agency: Other - See comment ([facility]) Date [place]: 01/27/21 Time [place]: 1000 Representative spoke with at [place]: teresa cooper   Social Determinants of Health ([place]) Interventions    Readmission Risk Interventions Readmission Risk Prevention Plan 01/27/2021  Transportation Screening Complete  PCP or Specialist Appt within 5-7 Days Complete  Home Care Screening Complete  Medication Review (RN CM) Complete  Some recent data might be hidden

## 2021-01-27 NOTE — Progress Notes (Signed)
Patients family set up ride for d/c at 245 prepared pt to  Discharge provided paperwork and prescription, at 59 family called and said they did not believe it was safe for pt to return home and that they wanted him to go to rehab for a couple weeks. Informed case work and MD. Will resume night time meds, cardiac tele d/c. Pt informed of not leaving. Resting comfortable in bed

## 2021-01-28 DIAGNOSIS — R6520 Severe sepsis without septic shock: Secondary | ICD-10-CM | POA: Diagnosis not present

## 2021-01-28 DIAGNOSIS — A419 Sepsis, unspecified organism: Secondary | ICD-10-CM | POA: Diagnosis not present

## 2021-01-28 LAB — COMPREHENSIVE METABOLIC PANEL
ALT: 16 U/L (ref 0–44)
AST: 16 U/L (ref 15–41)
Albumin: 2.3 g/dL — ABNORMAL LOW (ref 3.5–5.0)
Alkaline Phosphatase: 45 U/L (ref 38–126)
Anion gap: 10 (ref 5–15)
BUN: 35 mg/dL — ABNORMAL HIGH (ref 8–23)
CO2: 22 mmol/L (ref 22–32)
Calcium: 8.2 mg/dL — ABNORMAL LOW (ref 8.9–10.3)
Chloride: 107 mmol/L (ref 98–111)
Creatinine, Ser: 2.06 mg/dL — ABNORMAL HIGH (ref 0.61–1.24)
GFR, Estimated: 29 mL/min — ABNORMAL LOW (ref >60)
Glucose, Bld: 111 mg/dL — ABNORMAL HIGH (ref 70–99)
Potassium: 3.6 mmol/L (ref 3.5–5.1)
Sodium: 139 mmol/L (ref 135–145)
Total Bilirubin: 0.8 mg/dL (ref 0.3–1.2)
Total Protein: 4.8 g/dL — ABNORMAL LOW (ref 6.5–8.1)

## 2021-01-28 LAB — TYPE AND SCREEN
ABO/RH(D): O POS
Antibody Screen: NEGATIVE
Unit division: 0
Unit division: 0
Unit division: 0

## 2021-01-28 LAB — BPAM RBC
Blood Product Expiration Date: 202202122359
Blood Product Expiration Date: 202203042359
Blood Product Expiration Date: 202203042359
ISSUE DATE / TIME: 202202071619
ISSUE DATE / TIME: 202202091948
ISSUE DATE / TIME: 202202100113
Unit Type and Rh: 5100
Unit Type and Rh: 5100
Unit Type and Rh: 5100

## 2021-01-28 LAB — CBC
HCT: 23.2 % — ABNORMAL LOW (ref 39.0–52.0)
Hemoglobin: 7.4 g/dL — ABNORMAL LOW (ref 13.0–17.0)
MCH: 29 pg (ref 26.0–34.0)
MCHC: 31.9 g/dL (ref 30.0–36.0)
MCV: 91 fL (ref 80.0–100.0)
Platelets: 231 10*3/uL (ref 150–400)
RBC: 2.55 MIL/uL — ABNORMAL LOW (ref 4.22–5.81)
RDW: 18.9 % — ABNORMAL HIGH (ref 11.5–15.5)
WBC: 12.9 10*3/uL — ABNORMAL HIGH (ref 4.0–10.5)
nRBC: 0 % (ref 0.0–0.2)

## 2021-01-28 MED ORDER — PANTOPRAZOLE SODIUM 40 MG PO TBEC
40.0000 mg | DELAYED_RELEASE_TABLET | Freq: Two times a day (BID) | ORAL | Status: DC
  Administered 2021-01-28 – 2021-02-05 (×15): 40 mg via ORAL
  Filled 2021-01-28 (×15): qty 1

## 2021-01-28 MED ORDER — PANTOPRAZOLE SODIUM 40 MG PO TBEC: 40.0000 mg | DELAYED_RELEASE_TABLET | Freq: Two times a day (BID) | ORAL | 1 refills | Status: AC

## 2021-01-28 MED ORDER — SODIUM CHLORIDE 0.9% FLUSH
3.0000 mL | Freq: Two times a day (BID) | INTRAVENOUS | Status: DC
  Administered 2021-01-28 – 2021-02-03 (×11): 3 mL via INTRAVENOUS

## 2021-01-28 NOTE — Progress Notes (Signed)
Physical Therapy Treatment Patient Details Name: Devin Washington MRN: [id number] DOB: 1923-01-28 Today's Date: 01/28/2021    History of Present Illness Pt is a 87 y.o. male seen for evaluation of acute blood loss anemia, melena at the request of Dr. Mal Misty. Pt has a PMH of iron-deficiency anemia, hx of erosive esophagitis, Hx of peptic ulcer disease, HTN, s/p pacemaker in situ 2/2 SSS, paroxsymal atrial fibrillation, PVD, and Hx of prostate cancer. He presented to the [facility] ED via EMS on 02/07 for evaluation of altered mental status. Pt with syncopal episode in ED, also with hemoglobin 5.3, s/p transfusions.    PT Comments    Patient alert, agreeable to PT, denied pain. Pt exhibited increased difficulty with functional mobility this session, as well as more difficulty with activity tolerance/endurance. Supine to sit with HOB raised with rails, supervision. Good sitting balance noted. Several transferred performed this session; due to increased weakness pt required minA to come fully into standing with occasional RW steadying as well. Transferred to recliner in room with RW and CGA, pt very fatigued and SOB. Exercises performed in sitting and in standing as able. Pt up in chair, all needs in reach at end of session. The patient would benefit from further skilled PT intervention to continue to progress towards goals. Recommendation updated to SNF to reflect decline in functional mobility, CSW notified.     Follow Up Recommendations  SNF     Equipment Recommendations  Other (comment) (TBD at next venue of care)    Recommendations for Other Services OT consult     Precautions / Restrictions Precautions Precautions: Fall Restrictions Weight Bearing Restrictions: No    Mobility  Bed Mobility Overal bed mobility: Needs Assistance Bed Mobility: Supine to Sit     Supine to sit: Supervision;HOB elevated     General bed mobility comments: extended time to perform    Transfers Overall  transfer level: Needs assistance Equipment used: Rolling walker (2 wheeled) Transfers: Sit to/from Stand Sit to Stand: Min assist         General transfer comment: pt needed minA from recliner today due to increased difficulty  Ambulation/Gait Ambulation/Gait assistance: Min guard Gait Distance (Feet): 3 Feet Assistive device: Rolling walker (2 wheeled) Gait Pattern/deviations: Trunk flexed     General Gait Details: pt very fatigued and SOB after transferring to recliner. Deferred further mobility   Stairs             Wheelchair Mobility    Modified Rankin (Stroke Patients Only)       Balance Overall balance assessment: Needs assistance Sitting-balance support: Feet supported Sitting balance-Leahy Scale: Good Sitting balance - Comments: no LOB sitting EOB   Standing balance support: Bilateral upper extremity supported;During functional activity Standing balance-Leahy Scale: Fair Standing balance comment: does demonstarte safety concerns once fatigued                            Cognition Arousal/Alertness: Awake/alert Behavior During Therapy: WFL for tasks assessed/performed Overall Cognitive Status: Within Functional Limits for tasks assessed                                 General Comments: HOH. Extremely pleasant but very deconditioned.      Exercises Other Exercises Other Exercises: seated marching, LAQ, heel/toe raises x10 ea. seated rest breaks in between Other Exercises: 2 bouts of sit <> Stand from  recliner, minA to assist pt into fully standing, and pt stood and marched in place twice. unable to maintain >20seconds due to fatigue and SOB.    General Comments        Pertinent Vitals/Pain Pain Assessment: No/denies pain    Home Living                      Prior Function            PT Goals (current goals can now be found in the care plan section) Progress towards PT goals: Progressing toward goals     Frequency    Min 2X/week      PT Plan Discharge plan needs to be updated;Equipment recommendations need to be updated    Co-evaluation              AM-PAC PT "6 Clicks" Mobility   Outcome Measure  Help needed turning from your back to your side while in a flat bed without using bedrails?: A Little Help needed moving from lying on your back to sitting on the side of a flat bed without using bedrails?: A Little Help needed moving to and from a bed to a chair (including a wheelchair)?: A Little Help needed standing up from a chair using your arms (e.g., wheelchair or bedside chair)?: A Little Help needed to walk in hospital room?: A Lot Help needed climbing 3-5 steps with a railing? : Total 6 Click Score: 15    End of Session Equipment Utilized During Treatment: Gait belt Activity Tolerance: Patient tolerated treatment well Patient left: with call bell/phone within reach;with chair alarm set;in chair Nurse Communication: Mobility status PT Visit Diagnosis: Other abnormalities of gait and mobility (R26.89);Muscle weakness (generalized) (M62.81);Difficulty in walking, not elsewhere classified (R26.2);Unsteadiness on feet (R26.81)     Time: 5726-2035 PT Time Calculation (min) (ACUTE ONLY): 33 min  Charges:  $Therapeutic Exercise: 23-37 mins                     Lieutenant Diego PT, DPT 12:07 PM,01/28/21

## 2021-01-28 NOTE — Progress Notes (Signed)
Progress Note    Devin Washington  PYP:[id number] DOB: 1923/03/08  DOA: 01/21/2021 PCP: Albina Billet, MD   Brief Narrative:   Medical records reviewed and are as summarized below:  Devin Washington is a 87 y.o. male  with medical history significant for iron deficiency anemia, history of erosive esophagitis, history of peptic ulcer disease, hypertension, status post pacemaker insertion for sick sinus syndrome, peripheral vascular disease who was brought to the hospital because of altered mental status.  He lives at home alone.  He has been having dark stools at home.  He has chronic anemia and he goes to the  infusion center for periodic blood transfusions. He was hypothermic, hypoxic and hypotensive on admission.  Lactic acid was significantly elevated (greater than 11). He was admitted to the hospital for severe sepsis.  He was also found to have severe anemia with hemoglobin of 6.  Patient's abdominal pain diarrhea and enterocolitis have resolved at this point.  Patient's multifactorial and acute on chronic anemia appears to be stabilizing, he does have chronic anemia followed by heme-onc requiring transfusion here at admission due to symptomatic anemia.  Initially there was concern for GI bleeding in the setting of dark melanotic stools but endoscopy consistent with recent bleeding but no acute ongoing bleeding noted during scope.  Patient's hemoglobin continues to be low but within normal limits.  Patient was planned for discharge over the weekend to stay with family and/or friends, however it appears there is no available assistance at this time for caretaking at home and as such patient is not stable for discharge home on his own.  Currently evaluating for SNF placement.   Assessment/Plan:   Principal Problem:   Severe sepsis (HCC) Active Problems:   CKD (chronic kidney disease), stage III (HCC)   Hypertension   Symptomatic anemia   CHF (congestive heart failure) (HCC)    Peripheral artery disease (HCC)   Anemia in chronic kidney disease   Severe sepsis secondary to enterocolitis, POA Noted on CT abdomen and pelvis: Repeat chest x-ray on 01/22/2021 did not show any active infection.  Discontinue IV vancomycin.   Continue cefepime and Flagyl x 5-7 days.  Acute questionably blood loss anemia on chronic iron deficiency anemia/chronic anemia of chronic kidney disease:  Hemoglobin stable, currently 7.8 s/p 2 unit PRBC Of note, he has been receiving Retacrit and blood transfusion periodically at the cancer center as an outpatient indicating somewhat chronic production issue.  GI bleeding/dark stools:  Profound history of GI bleeding per GI/chart review.  Endoscopy remarkable for 3 cm hiatal hernia. Erosive gastropathy with stigmata of recent bleeding, but no active bleeding. Continue twice daily PPI  AKI on CKD stage IIIb:  Secondary to above, pre-renal azotemia due to profound anemia.  Improving -essentially back to baseline around 2.0  PVD with history of bilateral popliteal and iliac artery aneurysm: Aspirin has been held.  Venous Doppler of the left lower extremity showed popliteal artery aneurysm which is a normal finding.  He sees Dr. Lucky Cowboy, vascular surgeon, as an outpatient.  Syncope: Noted in the emergency room 01/21/2019 while being assisted to the bedside toilet for bowel movement.  Likely vasovagal syncope.  No further episodes or events  Diet Order            Diet general           Diet 2 gram sodium Room service appropriate? Yes; Fluid consistency: Thin  Diet effective now  Consultants:  GI  Procedures:  EGD 01/25/2021  Medications:   . sodium chloride   Intravenous Once  . acetaminophen  325 mg Oral BID  . feeding supplement  1 Bottle Oral Q24H  . ferrous sulfate  325 mg Oral Q breakfast  . furosemide  20 mg Oral Daily  . pantoprazole (PROTONIX) IV  40 mg Intravenous Q12H  . cyanocobalamin  1,000 mcg Oral Daily    Continuous Infusions: . sodium chloride 250 mL (01/28/21 0617)  . ceFEPime (MAXIPIME) IV 2 g (01/26/21 1324)  . metronidazole 500 mg (01/28/21 0617)    Anti-infectives (From admission, onward)   Start     Dose/Rate Route Frequency Ordered Stop   01/23/21 0903  vancomycin variable dose per unstable renal function (pharmacist dosing)  Status:  Discontinued         Does not apply See admin instructions 01/23/21 0903 01/25/21 1406   01/22/21 1200  ceFEPIme (MAXIPIME) 2 g in sodium chloride 0.9 % 100 mL IVPB        2 g 200 mL/hr over 30 Minutes Intravenous Every 24 hours 01/21/21 1649     01/21/21 1600  vancomycin (VANCOCIN) IVPB 1000 mg/200 mL premix        1,000 mg 200 mL/hr over 60 Minutes Intravenous  Once 01/21/21 1438 01/21/21 2107   01/21/21 1500  metroNIDAZOLE (FLAGYL) IVPB 500 mg        500 mg 100 mL/hr over 60 Minutes Intravenous Every 8 hours 01/21/21 1438     01/21/21 1445  ceFEPIme (MAXIPIME) 2 g in sodium chloride 0.9 % 100 mL IVPB  Status:  Discontinued        2 g 200 mL/hr over 30 Minutes Intravenous  Once 01/21/21 1438 01/21/21 1440   01/21/21 1230  vancomycin (VANCOREADY) IVPB 500 mg/100 mL        500 mg 100 mL/hr over 60 Minutes Intravenous  Once 01/21/21 1141 01/21/21 1355   01/21/21 1130  ceFEPIme (MAXIPIME) 2 g in sodium chloride 0.9 % 100 mL IVPB        2 g 200 mL/hr over 30 Minutes Intravenous  Once 01/21/21 1129 01/21/21 1228   01/21/21 1130  vancomycin (VANCOCIN) IVPB 1000 mg/200 mL premix        1,000 mg 200 mL/hr over 60 Minutes Intravenous  Once 01/21/21 1129 01/21/21 1256      Family Communication/Anticipated D/C date and plan/Code Status   DVT prophylaxis: SCDs Start: 01/21/21 1437    Code Status: DNR  Family Communication: None Disposition Plan:   Status is: Inpatient  Remains inpatient appropriate because:IV treatments appropriate due to intensity of illness or inability to take PO   Dispo: The patient is from: Home               Anticipated d/c is to: SNF              Anticipated d/c date is: 1 day              Patient currently is medically stable to d/c.   Difficult to place patient No   Subjective:   No acute issues/events this am. Patient denies headache, fevers, nausea, vomiting, constipation diarrhea shortness of breath or chest pain.  Continues to deny any further bowel movements.  Objective:    Vitals:   01/27/21 0803 01/27/21 2056 01/28/21 0100 01/28/21 0412  BP: (!) 153/83 (!) 143/76  (!) 142/71  Pulse: 85 96  96  Resp: 18 18  18  Temp: (!) 97.5 F (36.4 C) 98.5 F (36.9 C)  99.5 F (37.5 C)  TempSrc: Oral     SpO2: 100% 100%  98%  Weight:   78.8 kg   Height:       No data found.   Intake/Output Summary (Last 24 hours) at 01/28/2021 0740 Last data filed at 01/28/2021 0500 Gross per 24 hour  Intake 1237.52 ml  Output 1200 ml  Net 37.52 ml   Filed Weights   01/26/21 0400 01/27/21 0341 01/28/21 0100  Weight: 77.3 kg 78.4 kg 78.8 kg    Exam:  GEN: NAD SKIN: Warm and dry EYES: No pallor or icterus ENT: MMM CV: RRR PULM: CTA B ABD: soft, ND, NT, +BS CNS: AAO x 3, non focal EXT: Right leg pitting edema ( 2+), significant hard swelling of the left calf due to old aneursym   Data Reviewed:   Labs: Labs show the following:   Basic Metabolic Panel: Recent Labs  Lab 01/24/21 0536 01/25/21 0442 01/26/21 0454 01/27/21 0500 01/28/21 0436  NA 139 140 140 138 139  K 3.6 3.7 3.4* 3.4* 3.6  CL 106 108 107 108 107  CO2 21* 24 24 23 22   GLUCOSE 99 101* 93 116* 111*  BUN 41* 38* 34* 36* 35*  CREATININE 2.09* 2.04* 2.04* 1.95* 2.06*  CALCIUM 8.5* 8.4* 8.3* 7.9* 8.2*   GFR Estimated Creatinine Clearance: 22.5 mL/min (A) (by C-G formula based on SCr of 2.06 mg/dL (H)). Liver Function Tests: Recent Labs  Lab 01/21/21 1024 01/25/21 0442 01/26/21 0454 01/27/21 0500 01/28/21 0436  AST 67* 33 25 19 16   ALT 35 28 22 20 16   ALKPHOS 51 42 42 42 45  BILITOT 0.8 1.1 1.0 0.9 0.8   PROT 5.6* 4.9* 4.9* 4.8* 4.8*  ALBUMIN 2.7* 2.5* 2.3* 2.4* 2.3*   No results for input(s): LIPASE, AMYLASE in the last 168 hours. No results for input(s): AMMONIA in the last 168 hours. Coagulation profile Recent Labs  Lab 01/21/21 1024 01/22/21 0618  INR 1.6* 1.4*    CBC: Recent Labs  Lab 01/21/21 1024 01/22/21 1246 01/23/21 1427 01/24/21 0536 01/25/21 0442 01/26/21 0454 01/27/21 0500 01/28/21 0436  WBC 15.6* 18.0*  --  20.8* 14.8* 12.5* 13.8* 12.9*  NEUTROABS 13.6* 16.2*  --  18.7*  --   --   --   --   HGB 6.0* 5.1*   < > 8.2* 7.8* 7.5* 7.3* 7.4*  HCT 20.7* 15.8*   < > 25.3* 23.1* 22.9* 22.8* 23.2*  MCV 93.2 85.4  --  89.1 87.2 89.8 91.2 91.0  PLT 89* 120*  --  157 177 197 210 231   < > = values in this interval not displayed.   Cardiac Enzymes: Recent Labs  Lab 01/22/21 0618  CKTOTAL 837*   BNP (last 3 results) No results for input(s): PROBNP in the last 8760 hours. CBG: No results for input(s): GLUCAP in the last 168 hours. D-Dimer: No results for input(s): DDIMER in the last 72 hours. Hgb A1c: No results for input(s): HGBA1C in the last 72 hours. Lipid Profile: No results for input(s): CHOL, HDL, LDLCALC, TRIG, CHOLHDL, LDLDIRECT in the last 72 hours. Thyroid function studies: No results for input(s): TSH, T4TOTAL, T3FREE, THYROIDAB in the last 72 hours.  Invalid input(s): FREET3 Anemia work up: No results for input(s): VITAMINB12, FOLATE, FERRITIN, TIBC, IRON, RETICCTPCT in the last 72 hours. Sepsis Labs: Recent Labs  Lab 01/21/21 1024 01/21/21 2150 01/22/21 0618 01/22/21 1246  01/24/21 0536 01/25/21 0442 01/26/21 0454 01/27/21 0500 01/28/21 0436  PROCALCITON  --   --  3.62  --   --   --   --   --   --   WBC 15.6*  --   --  18.0* 20.8* 14.8* 12.5* 13.8* 12.9*  LATICACIDVEN >11.0*  >11.0* 3.2*  --  2.3* 1.9  --   --   --   --     Microbiology Recent Results (from the past 240 hour(s))  Blood culture (routine single)     Status: None    Collection Time: 01/21/21 10:55 AM   Specimen: BLOOD RIGHT FOREARM  Result Value Ref Range Status   Specimen Description BLOOD RIGHT FOREARM  Final   Special Requests   Final    BOTTLES DRAWN AEROBIC AND ANAEROBIC Blood Culture adequate volume   Culture   Final    NO GROWTH 5 DAYS Performed at [facility], [address] [place], [place] [address]    Report Status 01/26/2021 FINAL  Final  Urine culture     Status: None   Collection Time: 01/21/21 10:55 AM   Specimen: In/Out Cath Urine  Result Value Ref Range Status   Specimen Description   Final    IN/OUT CATH URINE Performed at [facility], [address] [place], [place] [address]    Special Requests   Final    NONE Performed at [facility], [address] [place], [place] [address]    Culture   Final    NO GROWTH Performed at [facility], [address], [place], [place] [address]    Report Status 01/22/2021 FINAL  Final  Blood culture (single)     Status: None   Collection Time: 01/21/21 10:55 AM   Specimen: BLOOD  Result Value Ref Range Status   Specimen Description BLOOD LEFT [facility]  Final   Special Requests   Final    BOTTLES DRAWN AEROBIC AND ANAEROBIC Blood Culture adequate volume   Culture   Final    NO GROWTH 5 DAYS Performed at [facility], [address] [place],  [address]    Report Status 01/26/2021 FINAL  Final    Procedures and diagnostic studies:  No results found.    LOS: 7 days   Little Ishikawa  [facility]   Pager on secure chat  If 7PM-7AM, please contact night-coverage at [email or web address]  01/28/2021, 7:40 AM

## 2021-01-28 NOTE — Progress Notes (Signed)
Occupational Therapy Treatment Patient Details Name: Devin Washington MRN: [id number] DOB: 1923-06-14 Today's Date: 01/28/2021    History of present illness Pt is a 87 y.o. male seen for evaluation of acute blood loss anemia, melena at the request of Dr. Mal Misty. Pt has a PMH of iron-deficiency anemia, hx of erosive esophagitis, Hx of peptic ulcer disease, HTN, s/p pacemaker in situ 2/2 SSS, paroxsymal atrial fibrillation, PVD, and Hx of prostate cancer. He presented to the [facility] ED via EMS on 02/07 for evaluation of altered mental status. Pt with syncopal episode in ED, also with hemoglobin 5.3, s/p transfusions.   OT comments  Devin Washington was seen for OT treatment on this date. Upon arrival to room pt seated in chair, agreeable to session. Pt completed therapeutic exercises as described below. Required increased physical assist to stand this date compared to prior session - MIN A + RW for STS x5 trials. Pt demonstrates poor safety awareness with fatigue. Pt requires prolonged seated rest breaks between standing trials 2/2 increased WOB. SETUP for seated UBD tasks. Pt instructed in HEP for IS (provided). Pt making good progress toward goals. Pt continues to benefit from skilled OT services to maximize return to PLOF and minimize risk of future falls, injury, caregiver burden, and readmission. Will continue to follow POC. Discharge recommendation updated to reflect increased assistance needed for mobility.    Follow Up Recommendations  SNF    Equipment Recommendations  3 in 1 bedside commode    Recommendations for Other Services      Precautions / Restrictions Precautions Precautions: Fall Restrictions Weight Bearing Restrictions: No       Mobility Bed Mobility     General bed mobility comments: pt received and left up in chair  Transfers Overall transfer level: Needs assistance Equipment used: Rolling walker (2 wheeled) Transfers: Sit to/from Stand Sit to Stand: Min assist          General transfer comment: lift off and eccentric control    Balance Overall balance assessment: Needs assistance Sitting-balance support: Feet supported Sitting balance-Leahy Scale: Good Sitting balance - Comments: no LOB sitting EOB   Standing balance support: Bilateral upper extremity supported;During functional activity Standing balance-Leahy Scale: Fair Standing balance comment: heavy BUE on RW                           ADL either performed or assessed with clinical judgement   ADL Overall ADL's : Needs assistance/impaired                                       General ADL Comments: MIN A for ADL t/f. SETUP don/doff gown and self-drinking seated EOC.               Cognition Arousal/Alertness: Awake/alert Behavior During Therapy: WFL for tasks assessed/performed Overall Cognitive Status: Within Functional Limits for tasks assessed                                 General Comments: HOH. Extremely pleasant but very deconditioned.        Exercises Exercises: Other exercises Other Exercises Other Exercises: Pt educated re: DME recs, d/c recs, HEP, IS frequency and use (provided) Other Exercises: STS x5, self-drinking, dressing, sitting/standing balance/tlelrance  Pertinent Vitals/ Pain       Pain Assessment: No/denies pain         Frequency  Min 2X/week        Progress Toward Goals  OT Goals(current goals can now be found in the care plan section)  Progress towards OT goals: Not progressing toward goals - comment (increased weakenss)  Acute Rehab OT Goals Patient Stated Goal: to get his strength back OT Goal Formulation: With patient Time For Goal Achievement: 02/08/21 Potential to Achieve Goals: Good ADL Goals Pt Will Perform Grooming: with modified independence;standing Pt Will Perform Lower Body Dressing: with modified independence;sit to/from stand Pt Will Transfer to Toilet: with modified  independence;ambulating;regular height toilet  Plan Discharge plan needs to be updated;Frequency remains appropriate       AM-PAC OT "6 Clicks" Daily Activity     Outcome Measure   Help from another person eating meals?: None Help from another person taking care of personal grooming?: A Little Help from another person toileting, which includes using toliet, bedpan, or urinal?: A Little Help from another person bathing (including washing, rinsing, drying)?: A Little Help from another person to put on and taking off regular upper body clothing?: A Little Help from another person to put on and taking off regular lower body clothing?: A Little 6 Click Score: 19    End of Session Equipment Utilized During Treatment: Rolling walker  OT Visit Diagnosis: Other abnormalities of gait and mobility (R26.89);Muscle weakness (generalized) (M62.81)   Activity Tolerance Patient tolerated treatment well   Patient Left in chair;with call bell/phone within reach;with chair alarm set   Nurse Communication          Time: [phone] OT Time Calculation (min): 23 min  Charges: OT General Charges $OT Visit: 1 Visit OT Treatments $Self Care/Home Management : 8-22 mins $Therapeutic Exercise: 8-22 mins  Dessie Coma, M.S. OTR/L  01/28/21, 1:10 PM  ascom [phone]

## 2021-01-28 NOTE — Care Management Important Message (Signed)
Important Message  Patient Details  Name: Devin Washington MRN: [id number] Date of Birth: 02/23/1923   Medicare Important Message Given:  Yes     Dannette Barbara 01/28/2021, 12:11 PM

## 2021-01-29 DIAGNOSIS — R6520 Severe sepsis without septic shock: Secondary | ICD-10-CM | POA: Diagnosis not present

## 2021-01-29 DIAGNOSIS — A419 Sepsis, unspecified organism: Secondary | ICD-10-CM | POA: Diagnosis not present

## 2021-01-29 LAB — SURGICAL PATHOLOGY

## 2021-01-29 NOTE — Progress Notes (Signed)
Progress Note    Devin Washington:[id number] DOB: September 21, 1923  DOA: 01/21/2021 PCP: Albina Billet, MD   Brief Narrative:   Medical records reviewed and are as summarized below:  Devin Washington is a 87 y.o. male  with medical history significant for iron deficiency anemia, history of erosive esophagitis, history of peptic ulcer disease, hypertension, status post pacemaker insertion for sick sinus syndrome, peripheral vascular disease who was brought to the hospital because of altered mental status.  He lives at home alone.  He has been having dark stools at home.  He has chronic anemia and he goes to the  infusion center for periodic blood transfusions. He was hypothermic, hypoxic and hypotensive on admission.  Lactic acid was significantly elevated (greater than 11). He was admitted to the hospital for severe sepsis.  He was also found to have severe anemia with hemoglobin of 6.  Patient's abdominal pain diarrhea and enterocolitis have resolved at this point.  Patient's multifactorial and acute on chronic anemia appears to be stabilizing, he does have chronic anemia followed by heme-onc requiring transfusion here at admission due to symptomatic anemia.  Initially there was concern for GI bleeding in the setting of dark melanotic stools but endoscopy consistent with recent bleeding but no acute ongoing bleeding noted during scope.  Patient's hemoglobin continues to be low but within normal limits.  Patient was planned for discharge over the weekend to stay with family and/or friends, however it appears there is no available assistance at this time for caretaking at home and as such patient is not stable for discharge home on his own.  Currently evaluating for SNF placement.   Assessment/Plan:   Principal Problem:   Severe sepsis (HCC) Active Problems:   CKD (chronic kidney disease), stage III (HCC)   Hypertension   Symptomatic anemia   CHF (congestive heart failure) ([place])    Peripheral artery disease (HCC)   Anemia in chronic kidney disease   Severe sepsis secondary to enterocolitis, POA Noted on CT abdomen and pelvis Chest x-ray on 01/22/2021 did not show any active infection.  Antibiotics completed.  Acute questionably blood loss anemia on chronic iron deficiency anemia/chronic anemia of chronic kidney disease:  Hemoglobin stable s/p 2 unit PRBC Continue increased dose of p.o. iron Of note, he has been receiving Retacrit and blood transfusion periodically at the cancer center as an outpatient indicating somewhat chronic production issue.  Acute episode of GI bleeding/melena, POA:  Profound history of GI bleeding per GI/chart review.  Endoscopy remarkable for 3 cm hiatal hernia. Erosive gastropathy with stigmata of recent bleeding, but no active bleeding. Continue twice daily PPI  AKI on CKD stage IIIb, resolved:  Secondary to above, pre-renal azotemia due to profound anemia.  Improving -essentially back to baseline  PVD with history of bilateral popliteal and iliac artery aneurysm: Aspirin has been held.  Venous Doppler of the left lower extremity showed popliteal artery aneurysm which is a normal finding.  He sees Dr. Lucky Cowboy, vascular surgeon, as an outpatient.  Syncope: Noted in the emergency room 01/21/2019 while being assisted to the bedside toilet for bowel movement.  Likely vasovagal syncope.  No further episodes or events  Diet Order            Diet general           Diet 2 gram sodium Room service appropriate? Yes; Fluid consistency: Thin  Diet effective now  Consultants:  GI  Procedures:  EGD 01/25/2021  Medications:   . sodium chloride   Intravenous Once  . acetaminophen  325 mg Oral BID  . feeding supplement  1 Bottle Oral Q24H  . ferrous sulfate  325 mg Oral Q breakfast  . furosemide  20 mg Oral Daily  . pantoprazole  40 mg Oral BID AC  . sodium chloride flush  3 mL Intravenous Q12H  . cyanocobalamin  1,000 mcg  Oral Daily   Continuous Infusions: . sodium chloride 250 mL (01/28/21 0617)    Anti-infectives (From admission, onward)   Start     Dose/Rate Route Frequency Ordered Stop   01/23/21 0903  vancomycin variable dose per unstable renal function (pharmacist dosing)  Status:  Discontinued         Does not apply See admin instructions 01/23/21 0903 01/25/21 1406   01/22/21 1200  ceFEPIme (MAXIPIME) 2 g in sodium chloride 0.9 % 100 mL IVPB        2 g 200 mL/hr over 30 Minutes Intravenous Every 24 hours 01/21/21 1649 01/28/21 2359   01/21/21 1600  vancomycin (VANCOCIN) IVPB 1000 mg/200 mL premix        1,000 mg 200 mL/hr over 60 Minutes Intravenous  Once 01/21/21 1438 01/21/21 2107   01/21/21 1500  metroNIDAZOLE (FLAGYL) IVPB 500 mg  Status:  Discontinued        500 mg 100 mL/hr over 60 Minutes Intravenous Every 8 hours 01/21/21 1438 01/28/21 1312   01/21/21 1445  ceFEPIme (MAXIPIME) 2 g in sodium chloride 0.9 % 100 mL IVPB  Status:  Discontinued        2 g 200 mL/hr over 30 Minutes Intravenous  Once 01/21/21 1438 01/21/21 1440   01/21/21 1230  vancomycin (VANCOREADY) IVPB 500 mg/100 mL        500 mg 100 mL/hr over 60 Minutes Intravenous  Once 01/21/21 1141 01/21/21 1355   01/21/21 1130  ceFEPIme (MAXIPIME) 2 g in sodium chloride 0.9 % 100 mL IVPB        2 g 200 mL/hr over 30 Minutes Intravenous  Once 01/21/21 1129 01/21/21 1228   01/21/21 1130  vancomycin (VANCOCIN) IVPB 1000 mg/200 mL premix        1,000 mg 200 mL/hr over 60 Minutes Intravenous  Once 01/21/21 1129 01/21/21 1256      Family Communication/Anticipated D/C date and plan/Code Status   DVT prophylaxis: SCDs Start: 01/21/21 1437    Code Status: DNR  Family Communication: None Disposition Plan:   Status is: Inpatient  Remains inpatient appropriate because:IV treatments appropriate due to intensity of illness or inability to take PO   Dispo: The patient is from: Home              Anticipated d/c is to: SNF               Anticipated d/c date is: 1 day              Patient currently is medically stable to d/c.   Difficult to place patient No   Subjective:   No acute issues/events this am. Patient denies headache, fevers, nausea, vomiting, constipation diarrhea shortness of breath or chest pain.  Continues to deny any further bowel movements.  Objective:    Vitals:   01/28/21 1114 01/28/21 1522 01/28/21 2136 01/29/21 0317  BP: (!) 117/50 129/75 (!) 147/72 140/74  Pulse: (!) 112 (!) 105 95 93  Resp: 18 (!) 22 17 17  Temp: 98.2 F (36.8 C) 98 F (36.7 C) 97.8 F (36.6 C) 99 F (37.2 C)  TempSrc:  Oral Oral Oral  SpO2: 100% 100% 100% 100%  Weight:    80.1 kg  Height:       No data found.   Intake/Output Summary (Last 24 hours) at 01/29/2021 0816 Last data filed at 01/29/2021 0048 Gross per 24 hour  Intake --  Output 350 ml  Net -350 ml   Filed Weights   01/27/21 0341 01/28/21 0100 01/29/21 0317  Weight: 78.4 kg 78.8 kg 80.1 kg    Exam:  GEN: NAD SKIN: Warm and dry EYES: No pallor or icterus ENT: MMM CV: RRR PULM: CTA B ABD: soft, ND, NT, +BS CNS: AAO x 3, non focal EXT: Right leg pitting edema ( 2+), significant hard swelling of the left calf due to old aneursym   Data Reviewed:   Labs: Labs show the following:   Basic Metabolic Panel: Recent Labs  Lab 01/24/21 0536 01/25/21 0442 01/26/21 0454 01/27/21 0500 01/28/21 0436  NA 139 140 140 138 139  K 3.6 3.7 3.4* 3.4* 3.6  CL 106 108 107 108 107  CO2 21* 24 24 23 22   GLUCOSE 99 101* 93 116* 111*  BUN 41* 38* 34* 36* 35*  CREATININE 2.09* 2.04* 2.04* 1.95* 2.06*  CALCIUM 8.5* 8.4* 8.3* 7.9* 8.2*   GFR Estimated Creatinine Clearance: 22.5 mL/min (A) (by C-G formula based on SCr of 2.06 mg/dL (H)). Liver Function Tests: Recent Labs  Lab 01/25/21 0442 01/26/21 0454 01/27/21 0500 01/28/21 0436  AST 33 25 19 16   ALT 28 22 20 16   ALKPHOS 42 42 42 45  BILITOT 1.1 1.0 0.9 0.8  PROT 4.9* 4.9* 4.8* 4.8*   ALBUMIN 2.5* 2.3* 2.4* 2.3*   No results for input(s): LIPASE, AMYLASE in the last 168 hours. No results for input(s): AMMONIA in the last 168 hours. Coagulation profile No results for input(s): INR, PROTIME in the last 168 hours.  CBC: Recent Labs  Lab 01/22/21 1246 01/23/21 1427 01/24/21 0536 01/25/21 0442 01/26/21 0454 01/27/21 0500 01/28/21 0436  WBC 18.0*  --  20.8* 14.8* 12.5* 13.8* 12.9*  NEUTROABS 16.2*  --  18.7*  --   --   --   --   HGB 5.1*   < > 8.2* 7.8* 7.5* 7.3* 7.4*  HCT 15.8*   < > 25.3* 23.1* 22.9* 22.8* 23.2*  MCV 85.4  --  89.1 87.2 89.8 91.2 91.0  PLT 120*  --  157 177 197 210 231   < > = values in this interval not displayed.   Cardiac Enzymes: No results for input(s): CKTOTAL, CKMB, CKMBINDEX, TROPONINI in the last 168 hours. BNP (last 3 results) No results for input(s): PROBNP in the last 8760 hours. CBG: No results for input(s): GLUCAP in the last 168 hours. D-Dimer: No results for input(s): DDIMER in the last 72 hours. Hgb A1c: No results for input(s): HGBA1C in the last 72 hours. Lipid Profile: No results for input(s): CHOL, HDL, LDLCALC, TRIG, CHOLHDL, LDLDIRECT in the last 72 hours. Thyroid function studies: No results for input(s): TSH, T4TOTAL, T3FREE, THYROIDAB in the last 72 hours.  Invalid input(s): FREET3 Anemia work up: No results for input(s): VITAMINB12, FOLATE, FERRITIN, TIBC, IRON, RETICCTPCT in the last 72 hours. Sepsis Labs: Recent Labs  Lab 01/22/21 1246 01/24/21 0536 01/25/21 0442 01/26/21 0454 01/27/21 0500 01/28/21 0436  WBC 18.0* 20.8* 14.8* 12.5* 13.8* 12.9*  LATICACIDVEN 2.3* 1.9  --   --   --   --  Microbiology Recent Results (from the past 240 hour(s))  Blood culture (routine single)     Status: None   Collection Time: 01/21/21 10:55 AM   Specimen: BLOOD RIGHT FOREARM  Result Value Ref Range Status   Specimen Description BLOOD RIGHT FOREARM  Final   Special Requests   Final    BOTTLES DRAWN AEROBIC  AND ANAEROBIC Blood Culture adequate volume   Culture   Final    NO GROWTH 5 DAYS Performed at [facility], [address] [place], [place] [address]    Report Status 01/26/2021 FINAL  Final  Urine culture     Status: None   Collection Time: 01/21/21 10:55 AM   Specimen: In/Out Cath Urine  Result Value Ref Range Status   Specimen Description   Final    IN/OUT CATH URINE Performed at [facility], [address] [place], [place] [address]    Special Requests   Final    NONE Performed at [facility], [address], [place], [place] [address]    Culture   Final    NO GROWTH Performed at [facility], [place] [address], [place], [place] [address]    Report Status 01/22/2021 FINAL  Final  Blood culture (single)     Status: None   Collection Time: 01/21/21 10:55 AM   Specimen: BLOOD  Result Value Ref Range Status   Specimen Description BLOOD LEFT [facility]  Final   Special Requests   Final    BOTTLES DRAWN AEROBIC AND ANAEROBIC Blood Culture adequate volume   Culture   Final    NO GROWTH 5 DAYS Performed at [facility], [address], [place], [place] [address]    Report Status 01/26/2021 FINAL  Final    Procedures and diagnostic studies:  No results found.    LOS: 8 days   Little Ishikawa  [facility]   Pager on secure chat  If 7PM-7AM, please contact night-coverage at [email or web address]  01/29/2021, 8:16 AM

## 2021-01-30 ENCOUNTER — Inpatient Hospital Stay: Payer: Medicare HMO

## 2021-01-30 DIAGNOSIS — R6520 Severe sepsis without septic shock: Secondary | ICD-10-CM | POA: Diagnosis not present

## 2021-01-30 DIAGNOSIS — A419 Sepsis, unspecified organism: Secondary | ICD-10-CM | POA: Diagnosis not present

## 2021-01-30 LAB — CBC
HCT: 23.7 % — ABNORMAL LOW (ref 39.0–52.0)
Hemoglobin: 7.7 g/dL — ABNORMAL LOW (ref 13.0–17.0)
MCH: 29.5 pg (ref 26.0–34.0)
MCHC: 32.5 g/dL (ref 30.0–36.0)
MCV: 90.8 fL (ref 80.0–100.0)
Platelets: 246 10*3/uL (ref 150–400)
RBC: 2.61 MIL/uL — ABNORMAL LOW (ref 4.22–5.81)
RDW: 18.8 % — ABNORMAL HIGH (ref 11.5–15.5)
WBC: 12.7 10*3/uL — ABNORMAL HIGH (ref 4.0–10.5)
nRBC: 0 % (ref 0.0–0.2)

## 2021-01-30 NOTE — Progress Notes (Signed)
   01/29/21 2218  Assess: MEWS Score  Temp 99.8 F (37.7 C)  BP (!) 103/54  Pulse Rate (!) 101  Resp (!) 22  SpO2 100 %  O2 Device Room Air  Assess: MEWS Score  MEWS Temp 0  MEWS Systolic 0  MEWS Pulse 1  MEWS RR 1  MEWS LOC 0  MEWS Score 2  MEWS Score Color Yellow  Assess: if the MEWS score is Yellow or Red  Were vital signs taken at a resting state? Yes  Focused Assessment No change from prior assessment  Early Detection of Sepsis Score *See Row Information* Medium  MEWS guidelines implemented *See Row Information* Yes  Treat  MEWS Interventions Administered scheduled meds/treatments  Pain Scale 0-10  Pain Score 0  Take Vital Signs  Increase Vital Sign Frequency  Yellow: Q 2hr X 2 then Q 4hr X 2, if remains yellow, continue Q 4hrs  Escalate  MEWS: Escalate Yellow: discuss with charge nurse/RN and consider discussing with provider and RRT  Notify: Charge Nurse/RN  Name of Charge Nurse/RN Notified Marcel, RN  Date Charge Nurse/RN Notified 01/30/21  Time Charge Nurse/RN Notified 2300  Document  Patient Outcome Other (Comment) (continue to monitor)

## 2021-01-30 NOTE — Progress Notes (Signed)
[facility] PROGRESS NOTE    Devin Washington  LOV:[id number] DOB: [birth year] DOA: 01/21/2021 PCP: Albina Billet, MD      Brief Narrative:  Devin Washington is a 87 y.o. M with HTN, SSS with PPM, PVD, peptic ulcer disease and erosive esophagitis, and iron deficiency anemia who presented with confusion and dark stools.  He presented to the infusion center for blood transfusion was found to be hypothermic, hypoxic and hypotensive.  In the ER lactate was 11, hemoglobin 6.       Assessment & Plan:  Right leg weakness This appears to be new over the last several days, although the exact origin is not clear.  Patient cannot get an MRI because of pacemaker. -Obtain CT head   ADDENDUM:  CT head unremarkable for age.  Given the chronicity of his symptoms, I feel this CT head is sensitive for subacute stroke, and rules out ischemia.  No further stroke work up necessary -PT    Sepsis due to enterocolitis, present on admission Antibiotics completed, enterocolitis resolved  Acute blood loss anemia on chronic iron deficiency anemia, anemia of chronic kidney disease Transfused 2 units, now hemoglobin stable, no further clinical bleeding -Continue iron  AKI on CKD stage IIIb Resolved  Peripheral vascular disease -Hold aspirin  Peripheral edema -Continue furosemide      Disposition: Status is: Inpatient  Remains inpatient appropriate because:Unsafe d/c plan   Dispo: The patient is from: Home              Anticipated d/c is to: SNF              Anticipated d/c date is: 3 days              Patient currently is medically stable to d/c.   Difficult to place patient Yes       Level of care: Med-Surg       MDM: The below labs and imaging reports were reviewed and summarized above.  Medication management as above.   DVT prophylaxis: SCDs Start: 01/21/21 1437  Code Status: DNR Family Communication:          Subjective: Patient has right  leg weakness.  He has no focal weakness anywhere else, no focal numbness, no speech disturbance, no headache, no neck pain, no confusion.  No fever, cough.  No further melena or hematochezia.  Objective: Vitals:   01/30/21 0233 01/30/21 0737 01/30/21 1157 01/30/21 1600  BP: 124/65 (!) 151/80 133/90 124/71  Pulse: 97 98 92 (!) 105  Resp: (!) 22 16 15 16   Temp: 98.8 F (37.1 C) 98.5 F (36.9 C) 98.2 F (36.8 C) 98.5 F (36.9 C)  TempSrc: Oral Oral Oral   SpO2: 100% 100% 99% 100%  Weight:      Height:        Intake/Output Summary (Last 24 hours) at 01/30/2021 1757 Last data filed at 01/30/2021 1515 Gross per 24 hour  Intake 940 ml  Output 800 ml  Net 140 ml   Filed Weights   01/27/21 0341 01/28/21 0100 01/29/21 0317  Weight: 78.4 kg 78.8 kg 80.1 kg    Examination: General appearance: Elderly adult male, alert and in no obvious distress.  Eating his lunch HEENT: Anicteric, conjunctiva pink, lids and lashes normal.  Arcus senilis.  No nasal deformity, discharge, epistaxis.  Lips moist, oropharynx moist, no oral lesions.   Skin: Warm and dry.  no jaundice.  No suspicious rashes or lesions. Cardiac: RRR, nl  S1-S2, no murmurs appreciated.  Capillary refill is brisk.  JVP not visible.  No LE edema.  Radial pulses 2+ and symmetric. Respiratory: Normal respiratory rate and rhythm.  CTAB without rales or wheezes. Abdomen: Abdomen soft.  No TTP or guarding. No ascites, distension, hepatosplenomegaly.   MSK: No deformities or effusions. Neuro:  Extraocular movements are intact, without nystagmus.  Cranial nerve 5 is within normal limits.  Cranial nerve 7 is symmetrical.  Cranial nerve 8 is within normal limits.  Cranial nerves 9 and 10 reveal equal palate elevation.     Cranial nerve 12 is midline.  Motor strength testing is 5/5 in the upper extremities bilaterally, 3+/5 in the right lower extremity, normal on the left.  Romberg maneuver is negative for pathology, patient is oriented to place,  situation, naming grossly intact, recall, recent and remote, seems at his baseline.  Speech fluent, attention span and concentration seem within normal limits for age.  Psych: Sensorium intact and responding to questions, attention normal. Affect normal.  Judgment and insight appear impaired by dementia.    Data Reviewed: I have personally reviewed following labs and imaging studies:  CBC: Recent Labs  Lab 01/24/21 0536 01/25/21 0442 01/26/21 0454 01/27/21 0500 01/28/21 0436 01/30/21 0521  WBC 20.8* 14.8* 12.5* 13.8* 12.9* 12.7*  NEUTROABS 18.7*  --   --   --   --   --   HGB 8.2* 7.8* 7.5* 7.3* 7.4* 7.7*  HCT 25.3* 23.1* 22.9* 22.8* 23.2* 23.7*  MCV 89.1 87.2 89.8 91.2 91.0 90.8  PLT 157 177 197 210 231 330   Basic Metabolic Panel: Recent Labs  Lab 01/24/21 0536 01/25/21 0442 01/26/21 0454 01/27/21 0500 01/28/21 0436  NA 139 140 140 138 139  K 3.6 3.7 3.4* 3.4* 3.6  CL 106 108 107 108 107  CO2 21* 24 24 23 22   GLUCOSE 99 101* 93 116* 111*  BUN 41* 38* 34* 36* 35*  CREATININE 2.09* 2.04* 2.04* 1.95* 2.06*  CALCIUM 8.5* 8.4* 8.3* 7.9* 8.2*   GFR: Estimated Creatinine Clearance: 22.5 mL/min (A) (by C-G formula based on SCr of 2.06 mg/dL (H)). Liver Function Tests: Recent Labs  Lab 01/25/21 0442 01/26/21 0454 01/27/21 0500 01/28/21 0436  AST 33 25 19 16   ALT 28 22 20 16   ALKPHOS 42 42 42 45  BILITOT 1.1 1.0 0.9 0.8  PROT 4.9* 4.9* 4.8* 4.8*  ALBUMIN 2.5* 2.3* 2.4* 2.3*   No results for input(s): LIPASE, AMYLASE in the last 168 hours. No results for input(s): AMMONIA in the last 168 hours. Coagulation Profile: No results for input(s): INR, PROTIME in the last 168 hours. Cardiac Enzymes: No results for input(s): CKTOTAL, CKMB, CKMBINDEX, TROPONINI in the last 168 hours. BNP (last 3 results) No results for input(s): PROBNP in the last 8760 hours. HbA1C: No results for input(s): HGBA1C in the last 72 hours. CBG: No results for input(s): GLUCAP in the last  168 hours. Lipid Profile: No results for input(s): CHOL, HDL, LDLCALC, TRIG, CHOLHDL, LDLDIRECT in the last 72 hours. Thyroid Function Tests: No results for input(s): TSH, T4TOTAL, FREET4, T3FREE, THYROIDAB in the last 72 hours. Anemia Panel: No results for input(s): VITAMINB12, FOLATE, FERRITIN, TIBC, IRON, RETICCTPCT in the last 72 hours. Urine analysis:    Component Value Date/Time   COLORURINE YELLOW (A) 01/21/2021 1055   APPEARANCEUR HAZY (A) 01/21/2021 1055   LABSPEC 1.016 01/21/2021 1055   PHURINE 5.0 01/21/2021 1055   GLUCOSEU 50 (A) 01/21/2021 1055   HGBUR  LARGE (A) 01/21/2021 1055   BILIRUBINUR NEGATIVE 01/21/2021 1055   KETONESUR NEGATIVE 01/21/2021 1055   PROTEINUR 100 (A) 01/21/2021 1055   NITRITE NEGATIVE 01/21/2021 1055   LEUKOCYTESUR NEGATIVE 01/21/2021 1055   Sepsis Labs: @LABRCNTIP (procalcitonin:4,lacticacidven:4)  ) Recent Results (from the past 240 hour(s))  Blood culture (routine single)     Status: None   Collection Time: 01/21/21 10:55 AM   Specimen: BLOOD RIGHT FOREARM  Result Value Ref Range Status   Specimen Description BLOOD RIGHT FOREARM  Final   Special Requests   Final    BOTTLES DRAWN AEROBIC AND ANAEROBIC Blood Culture adequate volume   Culture   Final    NO GROWTH 5 DAYS Performed at [facility], [address] [place], [place] [address]    Report Status 01/26/2021 FINAL  Final  Urine culture     Status: None   Collection Time: 01/21/21 10:55 AM   Specimen: In/Out Cath Urine  Result Value Ref Range Status   Specimen Description   Final    IN/OUT CATH URINE Performed at [facility], [address], [place], [place] [address]    Special Requests   Final    NONE Performed at [facility], [address] [place], [place] [address]    Culture   Final    NO GROWTH Performed at [facility], [place] [address], [place], [place] [address]    Report Status 01/22/2021 FINAL  Final  Blood culture  (single)     Status: None   Collection Time: 01/21/21 10:55 AM   Specimen: BLOOD  Result Value Ref Range Status   Specimen Description BLOOD LEFT [facility]  Final   Special Requests   Final    BOTTLES DRAWN AEROBIC AND ANAEROBIC Blood Culture adequate volume   Culture   Final    NO GROWTH 5 DAYS Performed at [facility], [address], [place], [place] [address]    Report Status 01/26/2021 FINAL  Final         Radiology Studies: CT HEAD WO CONTRAST  Result Date: 01/30/2021 CLINICAL DATA:  Altered mental status presentation. EXAM: CT HEAD WITHOUT CONTRAST TECHNIQUE: Contiguous axial images were obtained from the base of the skull through the vertex without intravenous contrast. COMPARISON:  01/21/2021 FINDINGS: Brain: No change. Age related atrophy. Chronic small-vessel ischemic changes of the cerebral hemispheric white matter. No sign of acute or subacute infarction, mass lesion, hemorrhage, hydrocephalus or extra-axial collection. Vascular: There is atherosclerotic calcification of the major vessels at the base of the brain. Skull: Negative Sinuses/Orbits: Clear/normal Other: None IMPRESSION: No acute finding by CT. Age related atrophy and chronic small-vessel ischemic changes of the white matter. Electronically Signed   By: Nelson Chimes M.D.   On: 01/30/2021 15:49        Scheduled Meds: . sodium chloride   Intravenous Once  . acetaminophen  325 mg Oral BID  . feeding supplement  1 Bottle Oral Q24H  . ferrous sulfate  325 mg Oral Q breakfast  . furosemide  20 mg Oral Daily  . pantoprazole  40 mg Oral BID AC  . sodium chloride flush  3 mL Intravenous Q12H  . cyanocobalamin  1,000 mcg Oral Daily   Continuous Infusions: . sodium chloride 250 mL (01/28/21 0617)     LOS: 9 days    Time spent: 25 minutes    Edwin Dada, MD [facility] 01/30/2021, 5:57 PM     Please page though Shea Evans  or Epic secure chat:  For [employer], Adult nurse

## 2021-01-30 NOTE — TOC Progression Note (Signed)
Transition of Care [facility]) - Progression Note    Patient Details  Name: KERRON SEDANO MRN: [id number] Date of Birth: 1923-10-25  Transition of Care [facility]) CM/SW [place], LCSW Phone Number: 01/30/2021, 10:31 AM  Clinical Narrative: Pt has no bed offers, faxed out to [facility].      Expected Discharge Plan: [place] Barriers to Discharge: No Barriers Identified  Expected Discharge Plan and Services Expected Discharge Plan: [place] In-house Referral: Clinical Social Work   Post Acute Care Choice: [employer] arrangements for the past 2 months: [place] Expected Discharge Date: 01/27/21                         HH Arranged: PT [place] Agency: Other - See comment ([facility]) Date [place]: 01/27/21 Time [place]: 1000 Representative spoke with at [place]: teresa cooper   Social Determinants of Health ([place]) Interventions    Readmission Risk Interventions Readmission Risk Prevention Plan 01/27/2021  Transportation Screening Complete  PCP or Specialist Appt within 5-7 Days Complete  Home Care Screening Complete  Medication Review (RN CM) Complete  Some recent data might be hidden

## 2021-01-30 NOTE — Progress Notes (Signed)
Physical Therapy Treatment Patient Details Name: Devin Washington MRN: [id number] DOB: September 07, 1923 Today's Date: 01/30/2021    History of Present Illness Pt is a 87 y.o. male seen for evaluation of acute blood loss anemia, melena at the request of Dr. Mal Misty. Pt has a PMH of iron-deficiency anemia, hx of erosive esophagitis, Hx of peptic ulcer disease, HTN, s/p pacemaker in situ 2/2 SSS, paroxsymal atrial fibrillation, PVD, and Hx of prostate cancer. He presented to the [facility] ED via EMS on 02/07 for evaluation of altered mental status. Pt with syncopal episode in ED, also with hemoglobin 5.3, s/p transfusions.    PT Comments    Pt just returned from CT upon arriving. He agrees to PT session and is cooperative throughout. Is HOH but oriented. Was able to sit up EOB without physical assistance. Does endorse dizziness that resolved with prolonged sitting rest. BP 127/84. Pt does c/o pain in RLE with edema in LLE noted. He stood and took very poor steps to recliner. Much more assistance required overall this session versus last session observed by author. He will greatly benefit from SNF at [place] to address deficits while improving independence with ADLs. Acute PT will continue to follow and progress as able per POC.     Follow Up Recommendations  SNF     Equipment Recommendations  Other (comment) (defer to next level of care)    Recommendations for Other Services       Precautions / Restrictions Precautions Precautions: Fall Restrictions Weight Bearing Restrictions: No    Mobility  Bed Mobility Overal bed mobility: Needs Assistance Bed Mobility: Supine to Sit Rolling: Supervision   Supine to sit: Supervision;HOB elevated     General bed mobility comments: did need to sit EOB x ~ 3 minutes 2/2 to c/o dizziness. BP 127/84    Transfers Overall transfer level: Needs assistance Equipment used: Rolling walker (2 wheeled) Transfers: Sit to/from Stand Sit to Stand: Min assist;Mod assist          General transfer comment: slightly elevated bed height. Pt did require min-mod to achieve standing. much more assistance than previously observed last session with author.  Ambulation/Gait Ambulation/Gait assistance: Min assist Gait Distance (Feet): 5 Feet Assistive device: Rolling walker (2 wheeled) Gait Pattern/deviations: Trunk flexed;Antalgic;Narrow base of support;Step-to pattern Gait velocity: decreased   General Gait Details: Pt only able to ambulate 5 ft today 2/2 to pain/ weakness on RLE. LLE edema noted however pt states its ben present x 1-2 months       Balance Overall balance assessment: Needs assistance Sitting-balance support: Feet supported Sitting balance-Leahy Scale: Good Sitting balance - Comments: no LOB sitting EOB   Standing balance support: Bilateral upper extremity supported;During functional activity Standing balance-Leahy Scale: Fair Standing balance comment: heavy BUE on RW       Cognition Arousal/Alertness: Awake/alert Behavior During Therapy: WFL for tasks assessed/performed Overall Cognitive Status: Within Functional Limits for tasks assessed    General Comments: HOH. Extremely pleasant but very deconditioned.             Pertinent Vitals/Pain Pain Assessment: 0-10 Pain Score: 2  Pain Location: RLE Pain Descriptors / Indicators: Aching;Spasm;Sore Pain Intervention(s): Limited activity within patient's tolerance;Monitored during session;Premedicated before session;Repositioned           PT Goals (current goals can now be found in the care plan section) Acute Rehab PT Goals Patient Stated Goal: to get his strength back Progress towards PT goals: Progressing toward goals    Frequency  Min 2X/week      PT Plan Current plan remains appropriate       AM-PAC PT "6 Clicks" Mobility   Outcome Measure  Help needed turning from your back to your side while in a flat bed without using bedrails?: A Little Help needed  moving from lying on your back to sitting on the side of a flat bed without using bedrails?: A Little Help needed moving to and from a bed to a chair (including a wheelchair)?: A Little Help needed standing up from a chair using your arms (e.g., wheelchair or bedside chair)?: A Little Help needed to walk in hospital room?: A Lot Help needed climbing 3-5 steps with a railing? : Total 6 Click Score: 15    End of Session Equipment Utilized During Treatment: Gait belt Activity Tolerance: Patient tolerated treatment well Patient left: with call bell/phone within reach;with chair alarm set;in chair Nurse Communication: Mobility status PT Visit Diagnosis: Other abnormalities of gait and mobility (R26.89);Muscle weakness (generalized) (M62.81);Difficulty in walking, not elsewhere classified (R26.2);Unsteadiness on feet (R26.81)     Time: 1533-1550 PT Time Calculation (min) (ACUTE ONLY): 17 min  Charges:  $Therapeutic Activity: 8-22 mins                     Julaine Fusi PTA 01/30/21, 4:18 PM

## 2021-01-31 ENCOUNTER — Inpatient Hospital Stay: Payer: Medicare HMO | Attending: Oncology

## 2021-01-31 ENCOUNTER — Inpatient Hospital Stay: Payer: Medicare HMO

## 2021-01-31 MED ORDER — EPOETIN ALFA-EPBX 10000 UNIT/ML IJ SOLN
5000.0000 [IU] | Freq: Once | INTRAMUSCULAR | Status: DC
Start: 2021-01-31 — End: 2021-02-01
  Filled 2021-01-31 (×2): qty 1

## 2021-01-31 NOTE — TOC Transition Note (Addendum)
Transition of Care [facility]) - CM/SW Discharge Note   Patient Details  Name: Devin Washington MRN: [id number] Date of Birth: 1923-07-28  Transition of Care [facility]) CM/SW Contact:  Kerin Salen, RN Phone Number: 01/31/2021, 11:57 AM   Clinical Narrative:  Patient with bed offer [facility] in [place], [place] daughter Sufyan Meidinger notified by phone, voices concern about distance, requesting [facility] or [facility] at [place]. Called both facilities, no beds at [facility] and [facility] is not in network. [place] daughter given this information and agrees with [facility]. Called [facility] to confirm bed offer, left message to return call. Attending and Nursing staff notified. Plan to transfer today.   Spoke with patient who was sitting in room recliner eating lunch, about SNF in [place], he responded that it was ok, as long as he get out of the hospital.  Next Level of Care: [place]. Barriers to Discharge: No Barriers Identified   Patient Goals and CMS Choice Patient states their goals for this hospitalization and ongoing recovery are:: To return back home CMS Medicare.gov Compare Post Acute Care list provided to:: Patient Choice offered to / list presented to : Patient  Discharge Placement                       Discharge Plan and Services In-house Referral: Clinical Social Work   Post Acute Care Choice: Home Health                    HH Arranged: PT [place] [facility] Agency: Other - See comment ([facility]) Date [place]: 01/27/21 Time [place]: 1000 Representative spoke with at [place]: teresa cooper  Social Determinants of Health ([place]) Interventions     Readmission Risk Interventions Readmission Risk Prevention Plan 01/27/2021  Transportation Screening Complete  PCP or Specialist Appt within 5-7 Days Complete  Home Care Screening Complete  Medication Review (RN CM) Complete  Some recent data might be  hidden

## 2021-01-31 NOTE — Progress Notes (Signed)
Attempted to call report to [facility] RN not available. Will try to call again at 1030. Patient packed and ready.

## 2021-01-31 NOTE — Progress Notes (Signed)
[facility] PROGRESS NOTE    KROSBY RITCHIE  YIF:[id number] DOB: February 11, 1923 DOA: 01/21/2021 PCP: Albina Billet, MD      Brief Narrative:  Mr. Erhart is a 87 y.o. M with HTN, SSS with PPM, PVD, peptic ulcer disease and erosive esophagitis, and iron deficiency anemia who presented with confusion and dark stools.  He presented to the infusion center for blood transfusion was found to be hypothermic, hypoxic and hypotensive.  In the [place] lactate was 11, hemoglobin 6.       Assessment & Plan:    Sepsis due to enterocolitis, present on admission Antibiotics completed, enterocolitis resolved  Acute blood loss anemia on chronic iron deficiency anemia, anemia of chronic kidney disease Transfused 2 units No clinical bleeding, Hgb trending up -Continue iron -Repeat EPO  AKI on CKD stage IIIb Resolved  Periveral vascular disease -Stop aspirin  Peripheral edema -Continue furosemide  COPD No wheezing or change in baseline symptoms -Continue Anoro -Continue PRN albuterol   Right leg weakness CT head unremarkable for age.  Given the chronicity of his symptoms, I feel this CT head is sensitive for subacute stroke, and rules out ischemia.  No further stroke work up necessary -PT         Disposition: Status is: Inpatient  Remains inpatient appropriate because:Unsafe d/c plan   Dispo: The patient is from: Home              Anticipated d/c is to: SNF              Anticipated d/c date is: 3 days              Patient currently is medically stable to d/c.   Difficult to place patient Yes    The patient was admitted with sepsis due to enterocolitis.  Is completed antibiotic treatment for this and it is resolved. He is significantly debilitated will require significant rehabilitation and physical therapy to return to his prior level of function.  At present we are still awaiting safe disposition.   Level of care: Med-Surg       MDM: The  below labs and imaging reports were reviewed and summarized above.  Medication management as above.   DVT prophylaxis: SCDs Start: 01/21/21 1437  Code Status: DNR Family Communication: granddaughter tonya by phone         Subjective: No change in right leg.  No new focal weakness, change in mentation, syncope, speech disturbance.     No further melena or hematochezia.      Objective: Vitals:   01/30/21 1911 01/31/21 0308 01/31/21 0740 01/31/21 1154  BP: 140/66 138/67 126/74 110/60  Pulse: 100 90 92 96  Resp: 18 20 18 18   Temp: 98.5 F (36.9 C) 98.4 F (36.9 C) 98 F (36.7 C) 98.2 F (36.8 C)  TempSrc: Oral Oral Oral Oral  SpO2: 100% 100% 100% 100%  Weight:      Height:        Intake/Output Summary (Last 24 hours) at 01/31/2021 1357 Last data filed at 01/31/2021 1356 Gross per 24 hour  Intake 660 ml  Output 1300 ml  Net -640 ml   Filed Weights   01/27/21 0341 01/28/21 0100 01/29/21 0317  Weight: 78.4 kg 78.8 kg 80.1 kg    Examination: General appearance: Adult male, sleeping, slumped over, arouses and is interactive and appropriate.     HEENT: Edentulous, oropharynx moist, no oral lesions, hearing normal. Skin:  Cardiac: RRR, no murmurs  appreciated, he has 2+ lower extremity edema. Respiratory: Normal respiratory rate and rhythm, lungs clear without rales or wheezes. Abdomen: Abdomen soft no tenderness palpation or guarding, no ascites or distention. MSK:  Neuro: Extraocular movements intact face symmetric, upper extremity strength normal, lower extremity strength normal and left, weak in the right. Psych: Sensorium intact responding to questions, attention normal, affect normal, judgment insight appear normal         Data Reviewed: I have personally reviewed following labs and imaging studies:  CBC: Recent Labs  Lab 01/25/21 0442 01/26/21 0454 01/27/21 0500 01/28/21 0436 01/30/21 0521  WBC 14.8* 12.5* 13.8* 12.9* 12.7*  HGB 7.8* 7.5* 7.3*  7.4* 7.7*  HCT 23.1* 22.9* 22.8* 23.2* 23.7*  MCV 87.2 89.8 91.2 91.0 90.8  PLT 177 197 210 231 400   Basic Metabolic Panel: Recent Labs  Lab 01/25/21 0442 01/26/21 0454 01/27/21 0500 01/28/21 0436  NA 140 140 138 139  K 3.7 3.4* 3.4* 3.6  CL 108 107 108 107  CO2 24 24 23 22   GLUCOSE 101* 93 116* 111*  BUN 38* 34* 36* 35*  CREATININE 2.04* 2.04* 1.95* 2.06*  CALCIUM 8.4* 8.3* 7.9* 8.2*   GFR: Estimated Creatinine Clearance: 22.5 mL/min (A) (by C-G formula based on SCr of 2.06 mg/dL (H)). Liver Function Tests: Recent Labs  Lab 01/25/21 0442 01/26/21 0454 01/27/21 0500 01/28/21 0436  AST 33 25 19 16   ALT 28 22 20 16   ALKPHOS 42 42 42 45  BILITOT 1.1 1.0 0.9 0.8  PROT 4.9* 4.9* 4.8* 4.8*  ALBUMIN 2.5* 2.3* 2.4* 2.3*   No results for input(s): LIPASE, AMYLASE in the last 168 hours. No results for input(s): AMMONIA in the last 168 hours. Coagulation Profile: No results for input(s): INR, PROTIME in the last 168 hours. Cardiac Enzymes: No results for input(s): CKTOTAL, CKMB, CKMBINDEX, TROPONINI in the last 168 hours. BNP (last 3 results) No results for input(s): PROBNP in the last 8760 hours. HbA1C: No results for input(s): HGBA1C in the last 72 hours. CBG: No results for input(s): GLUCAP in the last 168 hours. Lipid Profile: No results for input(s): CHOL, HDL, LDLCALC, TRIG, CHOLHDL, LDLDIRECT in the last 72 hours. Thyroid Function Tests: No results for input(s): TSH, T4TOTAL, FREET4, T3FREE, THYROIDAB in the last 72 hours. Anemia Panel: No results for input(s): VITAMINB12, FOLATE, FERRITIN, TIBC, IRON, RETICCTPCT in the last 72 hours. Urine analysis:    Component Value Date/Time   COLORURINE YELLOW (A) 01/21/2021 1055   APPEARANCEUR HAZY (A) 01/21/2021 1055   LABSPEC 1.016 01/21/2021 1055   PHURINE 5.0 01/21/2021 1055   GLUCOSEU 50 (A) 01/21/2021 1055   HGBUR LARGE (A) 01/21/2021 1055   BILIRUBINUR NEGATIVE 01/21/2021 1055   KETONESUR NEGATIVE 01/21/2021  1055   PROTEINUR 100 (A) 01/21/2021 1055   NITRITE NEGATIVE 01/21/2021 1055   LEUKOCYTESUR NEGATIVE 01/21/2021 1055   Sepsis Labs: @LABRCNTIP (procalcitonin:4,lacticacidven:4)  ) No results found for this or any previous visit (from the past 240 hour(s)).       Radiology Studies: CT HEAD WO CONTRAST  Result Date: 01/30/2021 CLINICAL DATA:  Altered mental status presentation. EXAM: CT HEAD WITHOUT CONTRAST TECHNIQUE: Contiguous axial images were obtained from the base of the skull through the vertex without intravenous contrast. COMPARISON:  01/21/2021 FINDINGS: Brain: No change. Age related atrophy. Chronic small-vessel ischemic changes of the cerebral hemispheric white matter. No sign of acute or subacute infarction, mass lesion, hemorrhage, hydrocephalus or extra-axial collection. Vascular: There is atherosclerotic calcification of the major  vessels at the base of the brain. Skull: Negative Sinuses/Orbits: Clear/normal Other: None IMPRESSION: No acute finding by CT. Age related atrophy and chronic small-vessel ischemic changes of the white matter. Electronically Signed   By: Nelson Chimes M.D.   On: 01/30/2021 15:49        Scheduled Meds: . sodium chloride   Intravenous Once  . acetaminophen  325 mg Oral BID  . feeding supplement  1 Bottle Oral Q24H  . ferrous sulfate  325 mg Oral Q breakfast  . furosemide  20 mg Oral Daily  . pantoprazole  40 mg Oral BID AC  . sodium chloride flush  3 mL Intravenous Q12H  . cyanocobalamin  1,000 mcg Oral Daily   Continuous Infusions: . sodium chloride 250 mL (01/28/21 0617)     LOS: 10 days    Time spent: 25 minutes    Edwin Dada, MD [facility] 01/31/2021, 1:57 PM     Please page though [place] or Epic secure chat:  For [employer], Adult nurse

## 2021-01-31 NOTE — Care Management Important Message (Signed)
Important Message  Patient Details  Name: Devin Washington MRN: [id number] Date of Birth: November 29, 1923   Medicare Important Message Given:  Yes     Dannette Barbara 01/31/2021, 1:07 PM

## 2021-01-31 NOTE — TOC Progression Note (Addendum)
Transition of Care [facility]) - Progression Note    Patient Details  Name: Devin Washington MRN: [id number] Date of Birth: 03/31/1923  Transition of Care [facility]) CM/SW [place], RN Phone Number: 01/31/2021, 3:21 PM  Clinical Narrative:    No bed offers due to insurance out of network. Potential bed offer through [facility] rescinded due to insurance.  LVMM for [facility] SNF for possible offer. Will outreach to granddaughter to discuss possible return home with [place] due to extended time needed for insurance authorization even after bed offer received. Patient is eager to discharge from hospital.    Expected Discharge Plan: [place] Barriers to Discharge: No Barriers Identified  Expected Discharge Plan and Services Expected Discharge Plan: [place] In-house Referral: Clinical Social Work   Post Acute Care Choice: [place] arrangements for the past 2 months: [place] Expected Discharge Date: 01/27/21                         HH Arranged: PT [place] Agency: Other - See comment ([facility]) Date [place]: 01/27/21 Time HH Agency Contacted: 1000 Representative spoke with at [place]: teresa cooper   Social Determinants of Health ([place]) Interventions    Readmission Risk Interventions Readmission Risk Prevention Plan 01/27/2021  Transportation Screening Complete  PCP or Specialist Appt within 5-7 Days Complete  Home Care Screening Complete  Medication Review (RN CM) Complete  Some recent data might be hidden

## 2021-01-31 NOTE — TOC Progression Note (Signed)
Transition of Care [facility]) - Progression Note    Patient Details  Name: Devin Washington MRN: [id number] Date of Birth: 1923/03/08  Transition of Care [facility]) CM/SW [place], RN Phone Number: 01/31/2021, 3:33 PM  Clinical Narrative:  Called and spoke with patient's grand daughter Kenney Houseman about [facility] rescinding bed offer due to Insurance not being in network and the possibility of [facility] in [place] having a bed. Tonya wan not in agreement with [place], she will make calls to [facility] and friends to assist her with trying to care for him at home. Will call me tomorrow with updates. I also informed Kenney Houseman that we can make arrangements for [facility] services however they will not be able to come every day. Kenney Houseman is agreeing to [facility] and will get additional assistance.     Expected Discharge Plan: [place] Barriers to Discharge: No Barriers Identified  Expected Discharge Plan and Services Expected Discharge Plan: [place] In-house Referral: Clinical Social Work   Post Acute Care Choice: [place] arrangements for the past 2 months: [place] Expected Discharge Date: 01/27/21                         HH Arranged: PT [place] Agency: Other - See comment ([facility]) Date [place]: 01/27/21 Time [place]: 1000 Representative spoke with at [place]: teresa cooper   Social Determinants of Health ([place]) Interventions    Readmission Risk Interventions Readmission Risk Prevention Plan 01/27/2021  Transportation Screening Complete  PCP or Specialist Appt within 5-7 Days Complete  Home Care Screening Complete  Medication Review (RN CM) Complete  Some recent data might be hidden

## 2021-01-31 NOTE — Progress Notes (Signed)
Occupational Therapy Treatment Patient Details Name: Devin Washington MRN: [id number] DOB: [birth year] Today's Date: 01/31/2021    History of present illness Pt is a 87 y.o. male seen for evaluation of acute blood loss anemia, melena at the request of Dr. Mal Misty. Pt has a PMH of iron-deficiency anemia, hx of erosive esophagitis, Hx of peptic ulcer disease, HTN, s/p pacemaker in situ 2/2 SSS, paroxsymal atrial fibrillation, PVD, and Hx of prostate cancer. He presented to the [facility] ED via EMS on 02/07 for evaluation of altered mental status. Pt with syncopal episode in ED, also with hemoglobin 5.3, s/p transfusions.   OT comments  Devin Washington presents today eager to engage in therapy, but demonstrates increasing weakness and declining mobility during his hospitalization. In therapy sessions last week, pt was able to stand with CGA, engage in grooming tasks in standing for 2 minutes, and ambulate 15 ft. Today, pt required Mod-Max A to come into standing, was unable to ambulate and could only remain in standing a few seconds. He struggled to maintain an upright posture in EOB sitting, and required Mod A to perform STS transfer from bed to recliner. Recommend ongoing OT while hospitalized to increase functional mobility, strength, and return to PLOF. Pt will benefit from STR, needing to increase strength and endurance before he is able to return home safely.   Follow Up Recommendations  SNF    Equipment Recommendations       Recommendations for Other Services      Precautions / Restrictions Precautions Precautions: Fall Restrictions Weight Bearing Restrictions: No       Mobility Bed Mobility Overal bed mobility: Needs Assistance Bed Mobility: Supine to Sit Rolling: Modified independent (Device/Increase time)   Supine to sit: Modified independent (Device/Increase time)     General bed mobility comments: increased time and effort for supine<sit, but no physical assist required. Pt SOB  following movement  Transfers Overall transfer level: Needs assistance Equipment used: Rolling walker (2 wheeled) Transfers: Sit to/from Stand Sit to Stand: Mod assist         General transfer comment: Pt required Mod A + several attempts to come from sit<stand    Balance Overall balance assessment: Needs assistance Sitting-balance support: Feet supported;Bilateral upper extremity supported Sitting balance-Leahy Scale: Fair   Postural control: Posterior lean Standing balance support: Bilateral upper extremity supported Standing balance-Leahy Scale: Poor Standing balance comment: Pt unstable in standing with RW, relying heavily on BUE support                           ADL either performed or assessed with clinical judgement   ADL Overall ADL's : Needs assistance/impaired Eating/Feeding: Modified independent   Grooming: Wash/dry face;Wash/dry hands;Brushing hair;Set up;Modified independent Grooming Details (indicate cue type and reason): IND following set up                     [place] and Hygiene: Moderate assistance Toileting - Clothing Manipulation Details (indicate cue type and reason): Pt had BM in bed, of which he was unaware. Mod-Max A for hygiene.             Vision Patient Visual Report: No change from baseline     Perception     Praxis      Cognition Arousal/Alertness: Awake/alert Behavior During Therapy: WFL for tasks assessed/performed Overall Cognitive Status: Within Functional Limits for tasks assessed  General Comments: HOH. Extremely pleasant but very deconditioned.        Exercises Total Joint Exercises Ankle Circles/Pumps: AROM;Both;10 reps;Seated Hip ABduction/ADduction: Both;5 reps;AROM Straight Leg Raises: AROM;Seated;10 reps;Both Knee Flexion: AROM;10 reps;Seated Marching in Standing: AROM;Both;Seated;10 reps (10 reps seated EOB. Attempted in  standing, but unable to perform) Other Exercises Other Exercises: Bed mobility, transfers, STS X 3, grooming, toileting, HEP   Shoulder Instructions       General Comments      Pertinent Vitals/ Pain       Pain Score: 4  Pain Location: RLE Pain Descriptors / Indicators: Aching;Spasm;Sore;Other (Comment) (stiff) Pain Intervention(s): Limited activity within patient's tolerance;Monitored during session;Repositioned  Home Living                                          Prior Functioning/Environment              Frequency  Min 2X/week        Progress Toward Goals  OT Goals(current goals can now be found in the care plan section)  Progress towards OT goals: Progressing toward goals  Acute Rehab OT Goals Patient Stated Goal: to get his strength back OT Goal Formulation: With patient Time For Goal Achievement: 02/08/21 Potential to Achieve Goals: [place] Discharge plan remains appropriate;Frequency remains appropriate    Co-evaluation                 AM-PAC OT "6 Clicks" Daily Activity     Outcome Measure   Help from another person eating meals?: None Help from another person taking care of personal grooming?: A Little Help from another person toileting, which includes using toliet, bedpan, or urinal?: A Little Help from another person bathing (including washing, rinsing, drying)?: A Little Help from another person to put on and taking off regular upper body clothing?: A Little Help from another person to put on and taking off regular lower body clothing?: A Lot 6 Click Score: 18    End of Session Equipment Utilized During Treatment: Rolling walker  OT Visit Diagnosis: Other abnormalities of gait and mobility (R26.89);Muscle weakness (generalized) (M62.81);Unsteadiness on feet (R26.81)   Activity Tolerance Patient tolerated treatment well   Patient Left in chair;with call bell/phone within reach;with chair alarm set;with  nursing/sitter in room   Nurse Communication          Time: 0950-1020 OT Time Calculation (min): 30 min  Charges: OT General Charges $OT Visit: 1 Visit OT Treatments $Self Care/Home Management : 8-22 mins $Therapeutic Activity: 8-22 mins  Josiah Lobo, PhD, MS, OTR/L ascom [phone] 01/31/21, 12:00 PM

## 2021-01-31 NOTE — Progress Notes (Signed)
Gave report to [employer] RN 2C. Patient was not transferred earlier due to high chance of being discharged to SNF. SFNFlocation denied this afternoon and patient now being transferred.

## 2021-02-01 ENCOUNTER — Other Ambulatory Visit: Payer: Self-pay

## 2021-02-01 LAB — SARS CORONAVIRUS 2 (TAT 6-24 HRS): SARS Coronavirus 2: NEGATIVE

## 2021-02-01 MED ORDER — SIMVASTATIN 40 MG PO TABS
40.0000 mg | ORAL_TABLET | Freq: Every day | ORAL | Status: DC
  Administered 2021-02-01 – 2021-02-04 (×4): 40 mg via ORAL
  Filled 2021-02-01 (×5): qty 1

## 2021-02-01 MED ORDER — METOPROLOL TARTRATE 50 MG PO TABS
50.0000 mg | ORAL_TABLET | Freq: Two times a day (BID) | ORAL | Status: DC
  Administered 2021-02-01 – 2021-02-05 (×9): 50 mg via ORAL
  Filled 2021-02-01 (×9): qty 1

## 2021-02-01 MED ORDER — ASPIRIN EC 81 MG PO TBEC
81.0000 mg | DELAYED_RELEASE_TABLET | Freq: Every day | ORAL | Status: DC
  Administered 2021-02-02 – 2021-02-05 (×4): 81 mg via ORAL
  Filled 2021-02-01 (×4): qty 1

## 2021-02-01 MED ORDER — EPOETIN ALFA 10000 UNIT/ML IJ SOLN
5000.0000 [IU] | Freq: Once | INTRAMUSCULAR | Status: AC
  Administered 2021-02-01: 5000 [IU] via SUBCUTANEOUS

## 2021-02-01 NOTE — Progress Notes (Signed)
Occupational Therapy Treatment Patient Details Name: Devin Washington MRN: [id number] DOB: Mar 12, 1923 Today's Date: 02/01/2021    History of present illness Pt is a 87 y.o. male seen for evaluation of acute blood loss anemia, melena at the request of Devin Washington. Pt has a PMH of iron-deficiency anemia, hx of erosive esophagitis, Hx of peptic ulcer disease, HTN, s/p pacemaker in situ 2/2 SSS, paroxsymal atrial fibrillation, PVD, and Hx of prostate cancer. He presented to the [facility] ED via EMS on 02/07 for evaluation of altered mental status. Pt with syncopal episode in ED, also with hemoglobin 5.3, s/p transfusions.   OT comments  Devin Washington was seen for OT treatment on this date. Upon arrival to room pt reclined in bed requesting to stretch his legs. Upon initial standing attempt pt reports need to toilet. MAX A + RW for BSC t/f and MAX A for perihygiene in standing. Pt tolerates ~1 min standing prior to requiring seated break. Required 8 sit<>stand trials for thorough perihygiene. MOD A for LBD at bed level. Pt had 3 large fist sized BMs - RN notifed. Pt completed bed level BUE exercises as described below. Pt making good progress toward goals. Pt continues to benefit from skilled OT services to maximize return to PLOF and minimize risk of future falls, injury, caregiver burden, and readmission. Will continue to follow POC. Discharge recommendation remains appropriate.    Follow Up Recommendations  SNF    Equipment Recommendations  3 in 1 bedside commode    Recommendations for Other Services      Precautions / Restrictions Precautions Precautions: Fall Restrictions Weight Bearing Restrictions: No       Mobility Bed Mobility Overal bed mobility: Needs Assistance Bed Mobility: Supine to Sit;Sit to Supine     Supine to sit: Supervision;HOB elevated Sit to supine: Supervision   General bed mobility comments: increased time and effort  Transfers Overall transfer level: Needs  assistance Equipment used: Rolling walker (2 wheeled) Transfers: Sit to/from [facility] Sit to Stand: Min assist Stand pivot transfers: Max assist       General transfer comment: minA from EOB and recliner twice this session. improved ability to perform independently with adequate UE support    Balance Overall balance assessment: Needs assistance Sitting-balance support: Feet supported;Bilateral upper extremity supported Sitting balance-Leahy Scale: Good Sitting balance - Comments: no LOB sitting EOB Postural control: Posterior lean Standing balance support: Bilateral upper extremity supported Standing balance-Leahy Scale: Poor Standing balance comment: heavy BUE support                           ADL either performed or assessed with clinical judgement   ADL Overall ADL's : Needs assistance/impaired                                       General ADL Comments: MAX A + RW for BSC t/f and MAX A for perihygiene in standing. Pt tolerates ~1 min standing prior to requiring seated break. Required 8 sit<>stand trials for thorough perihygiene. MOD A for LBD at bed level               Cognition Arousal/Alertness: Awake/alert Behavior During Therapy: WFL for tasks assessed/performed Overall Cognitive Status: Within Functional Limits for tasks assessed  General Comments: HOH. Extremely pleasant but very deconditioned.        Exercises Exercises: Other exercises;General Upper Extremity General Exercises - Upper Extremity Shoulder Flexion: AROM;Strengthening;Both;20 reps;Supine Shoulder Extension: AROM;Strengthening;Both;20 reps;Supine Shoulder ABduction: AROM;Strengthening;Both;20 reps;Supine Shoulder ADduction: AROM;Strengthening;Both;20 reps;Supine Shoulder Horizontal ABduction: AROM;Strengthening;Both;20 reps;Supine Shoulder Horizontal ADduction: AROM;Strengthening;Both;20  reps;Supine Elbow Flexion: AROM;Strengthening;Both;20 reps;Supine Elbow Extension: AROM;Strengthening;Both;20 reps;Supine Other Exercises Other Exercises: Pt educated re: HEP, falls prevention Other Exercises: Toileting, bed mobility, sit<>stand, sitting/standing balance/tolerance   Shoulder Instructions       General Comments SpO2 99% on RA - increased WOB    Pertinent Vitals/ Pain       Pain Assessment: No/denies pain Pain Score: 3  Pain Location: RLE Pain Descriptors / Indicators: Aching;Spasm;Sore;Other (Comment);Tingling Pain Intervention(s): Limited activity within patient's tolerance;Monitored during session;Repositioned         Frequency  Min 2X/week        Progress Toward Goals  OT Goals(current goals can now be found in the care plan section)  Progress towards OT goals: Progressing toward goals  Acute Rehab OT Goals Patient Stated Goal: to get his strength back OT Goal Formulation: With patient Time For Goal Achievement: 02/08/21 Potential to Achieve Goals: Fair ADL Goals Pt Will Perform Grooming: with modified independence;standing Pt Will Perform Lower Body Dressing: with modified independence;sit to/from stand Pt Will Transfer to Toilet: with modified independence;ambulating;regular height toilet  Plan Discharge plan remains appropriate;Frequency remains appropriate       AM-PAC OT "6 Clicks" Daily Activity     Outcome Measure   Help from another person eating meals?: None Help from another person taking care of personal grooming?: A Little Help from another person toileting, which includes using toliet, bedpan, or urinal?: A Little Help from another person bathing (including washing, rinsing, drying)?: A Little Help from another person to put on and taking off regular upper body clothing?: A Little Help from another person to put on and taking off regular lower body clothing?: A Lot 6 Click Score: 18    End of Session Equipment Utilized During  Treatment: Rolling walker  OT Visit Diagnosis: Other abnormalities of gait and mobility (R26.89);Muscle weakness (generalized) (M62.81);Unsteadiness on feet (R26.81)   Activity Tolerance Patient tolerated treatment well   Patient Left in bed;with call bell/phone within reach   Nurse Communication Mobility status        Time: 6333-5456 OT Time Calculation (min): 39 min  Charges: OT General Charges $OT Visit: 1 Visit OT Treatments $Self Care/Home Management : 23-37 mins $Therapeutic Exercise: 8-22 mins  Dessie Coma, M.S. OTR/L  02/01/21, 2:43 PM  ascom [phone]

## 2021-02-01 NOTE — Progress Notes (Signed)
[facility] PROGRESS NOTE    [name]  JTT:[id number] DOB: [birth year] DOA: 01/21/2021 PCP: Albina Billet, MD      Brief Narrative:  [name] is a 87 y.o. M with HTN, SSS with PPM, PVD, peptic ulcer disease and erosive esophagitis, and iron deficiency anemia who presented with confusion and dark stools.  He presented to the infusion center for blood transfusion was found to be hypothermic, hypoxic and hypotensive.  In the ER lactate was 11, hemoglobin 6.             Assessment & Plan:  Sepsis due to enterocolitis, present on admission Antibiotics completed, enterocolitis resolved    Acute blood loss anemia on chronic iron deficiency anemia, anemia of chronic kidney disease Transfused 2 units on admission, no further clinical bleeding, hemoglobin was trending up.  EPO repeated as scheduled 2/17. -Repeat hemoglobin tomorrow -Continue iron supplement   Paroxysmal atrial fibrillation Not on anticoagulants. -Resume home metoprolol   AKI on CKD stage IIIb Resolved  Peripheral vascular disease -Resume aspirin tomorrow if Hgb stable  Peripheral edema Hypertension BP normal off meds -Hold amlodipine -Continue furosemide  COPD No wheezing or change in baseline symptoms -Continue Anoro -Continue PRN albuterol   Right leg weakness CT head unremarkable for age.  Given the chronicity of his symptoms, I feel this CT head is sensitive for subacute stroke, and rules out ischemia.  No further stroke work up necessary -PT         Disposition: Status is: Inpatient  Remains inpatient appropriate because:Unsafe d/c plan   Dispo: The patient is from: Home              Anticipated d/c is to: SNF              Anticipated d/c date is: 3 days              Patient currently is medically stable to d/c.   Difficult to place patient Yes    The patient was admitted with sepsis due to enterocolitis.  He has completed antibiotic  treatment for this and it is resolved. He is significantly debilitated will require significant rehabilitation and physical therapy to return to his prior level of function.  He lives alone and has no supervision at home and will require rehabilitation in a skilled nursing setting to return to her PLOF.  Still awaiting safe disposition.   Level of care: Med-Surg       MDM: The below labs and imaging reports were reviewed and summarized above.  Medication management as above.   DVT prophylaxis: SCDs Start: 01/21/21 1437  Code Status: DNR Family Communication:           Subjective: No dyspnea, chest pain, palpitations, dizziness, confusion, syncope, speech disturbance, melena, or hematochezia.         Objective: Vitals:   01/31/21 2342 02/01/21 0455 02/01/21 0938 02/01/21 1111  BP: (!) 100/58 123/67 137/80 (!) 160/76  Pulse: 94 87 (!) 115 89  Resp: 18 18 (!) 22 16  Temp: 99.6 F (37.6 C) 98.6 F (37 C) 98.1 F (36.7 C) 97.6 F (36.4 C)  TempSrc: Oral Oral Oral Oral  SpO2: 99% 100% 100% 100%  Weight:      Height:        Intake/Output Summary (Last 24 hours) at 02/01/2021 1359 Last data filed at 02/01/2021 0130 Gross per 24 hour  Intake 120 ml  Output 450 ml  Net -330 ml  Filed Weights   01/27/21 0341 01/28/21 0100 01/29/21 0317  Weight: 78.4 kg 78.8 kg 80.1 kg    Examination: General appearance: Adult male, eating breakfast, no acute distress,   active. HEENT:    Skin:  Cardiac: Tachycardic, irregular, no murmurs, 1+ lower extremity edema bilaterally Respiratory: Normal respiratory rate and rhythm, lungs clear without rales or wheezes Abdomen: Abdomen soft no tenderness palpation or guarding MSK:  Neuro: Awake and alert, interactive, upper extremity strength appears normal, right leg still somewhat weak, but both legs are fairly weak and is hard to distinguish, speech fluent Psych: Attention normal, affect normal, judgment insight appear moderately  impaired dementia           Data Reviewed: I have personally reviewed following labs and imaging studies:  CBC: Recent Labs  Lab 01/26/21 0454 01/27/21 0500 01/28/21 0436 01/30/21 0521  WBC 12.5* 13.8* 12.9* 12.7*  HGB 7.5* 7.3* 7.4* 7.7*  HCT 22.9* 22.8* 23.2* 23.7*  MCV 89.8 91.2 91.0 90.8  PLT 197 210 231 956   Basic Metabolic Panel: Recent Labs  Lab 01/26/21 0454 01/27/21 0500 01/28/21 0436  NA 140 138 139  K 3.4* 3.4* 3.6  CL 107 108 107  CO2 24 23 22   GLUCOSE 93 116* 111*  BUN 34* 36* 35*  CREATININE 2.04* 1.95* 2.06*  CALCIUM 8.3* 7.9* 8.2*   GFR: Estimated Creatinine Clearance: 22.5 mL/min (A) (by C-G formula based on SCr of 2.06 mg/dL (H)). Liver Function Tests: Recent Labs  Lab 01/26/21 0454 01/27/21 0500 01/28/21 0436  AST 25 19 16   ALT 22 20 16   ALKPHOS 42 42 45  BILITOT 1.0 0.9 0.8  PROT 4.9* 4.8* 4.8*  ALBUMIN 2.3* 2.4* 2.3*   No results for input(s): LIPASE, AMYLASE in the last 168 hours. No results for input(s): AMMONIA in the last 168 hours. Coagulation Profile: No results for input(s): INR, PROTIME in the last 168 hours. Cardiac Enzymes: No results for input(s): CKTOTAL, CKMB, CKMBINDEX, TROPONINI in the last 168 hours. BNP (last 3 results) No results for input(s): PROBNP in the last 8760 hours. HbA1C: No results for input(s): HGBA1C in the last 72 hours. CBG: No results for input(s): GLUCAP in the last 168 hours. Lipid Profile: No results for input(s): CHOL, HDL, LDLCALC, TRIG, CHOLHDL, LDLDIRECT in the last 72 hours. Thyroid Function Tests: No results for input(s): TSH, T4TOTAL, FREET4, T3FREE, THYROIDAB in the last 72 hours. Anemia Panel: No results for input(s): VITAMINB12, FOLATE, FERRITIN, TIBC, IRON, RETICCTPCT in the last 72 hours. Urine analysis:    Component Value Date/Time   COLORURINE YELLOW (A) 01/21/2021 1055   APPEARANCEUR HAZY (A) 01/21/2021 1055   LABSPEC 1.016 01/21/2021 1055   PHURINE 5.0 01/21/2021  1055   GLUCOSEU 50 (A) 01/21/2021 1055   HGBUR LARGE (A) 01/21/2021 1055   BILIRUBINUR NEGATIVE 01/21/2021 1055   KETONESUR NEGATIVE 01/21/2021 1055   PROTEINUR 100 (A) 01/21/2021 1055   NITRITE NEGATIVE 01/21/2021 1055   LEUKOCYTESUR NEGATIVE 01/21/2021 1055   Sepsis Labs: @LABRCNTIP (procalcitonin:4,lacticacidven:4)  ) Recent Results (from the past 240 hour(s))  SARS CORONAVIRUS 2 (TAT 6-24 HRS) Nasopharyngeal Nasopharyngeal Swab     Status: None   Collection Time: 01/31/21  2:23 PM   Specimen: Nasopharyngeal Swab  Result Value Ref Range Status   SARS Coronavirus 2 NEGATIVE NEGATIVE Final    Comment: (NOTE) SARS-CoV-2 target nucleic acids are NOT DETECTED.  The SARS-CoV-2 RNA is generally detectable in upper and lower respiratory specimens during the acute phase of infection.  Negative results do not preclude SARS-CoV-2 infection, do not rule out co-infections with other pathogens, and should not be used as the sole basis for treatment or other patient management decisions. Negative results must be combined with clinical observations, patient history, and epidemiological information. The expected result is Negative.  Fact Sheet for Patients: SugarRoll.be  Fact Sheet for Healthcare Providers: https://www.woods-mathews.com/  This test is not yet approved or cleared by the [place] FDA and  has been authorized for detection and/or diagnosis of SARS-CoV-2 by FDA under an Emergency Use Authorization (EUA). This EUA will remain  in effect (meaning this test can be used) for the duration of the COVID-19 declaration under Se ction 564(b)(1) of the Act, 21 U.S.C. section 360bbb-3(b)(1), unless the authorization is terminated or revoked sooner.  Performed at [facility], [place] [address] [place], [place] [address]          Radiology Studies: CT HEAD WO CONTRAST  Result Date: 01/30/2021 CLINICAL DATA:  Altered mental  status presentation. EXAM: CT HEAD WITHOUT CONTRAST TECHNIQUE: Contiguous axial images were obtained from the base of the skull through the vertex without intravenous contrast. COMPARISON:  01/21/2021 FINDINGS: Brain: No change. Age related atrophy. Chronic small-vessel ischemic changes of the cerebral hemispheric white matter. No sign of acute or subacute infarction, mass lesion, hemorrhage, hydrocephalus or extra-axial collection. Vascular: There is atherosclerotic calcification of the major vessels at the base of the brain. Skull: Negative Sinuses/Orbits: Clear/normal Other: None IMPRESSION: No acute finding by CT. Age related atrophy and chronic small-vessel ischemic changes of the white matter. Electronically Signed   By: Nelson Chimes M.D.   On: 01/30/2021 15:49        Scheduled Meds: . sodium chloride   Intravenous Once  . acetaminophen  325 mg Oral BID  . [START ON 02/02/2021] aspirin EC  81 mg Oral Daily  . feeding supplement  1 Bottle Oral Q24H  . ferrous sulfate  325 mg Oral Q breakfast  . furosemide  20 mg Oral Daily  . metoprolol tartrate  50 mg Oral BID  . pantoprazole  40 mg Oral BID AC  . simvastatin  40 mg Oral QHS  . sodium chloride flush  3 mL Intravenous Q12H  . cyanocobalamin  1,000 mcg Oral Daily   Continuous Infusions: . sodium chloride 250 mL (01/28/21 0617)     LOS: 11 days    Time spent: 25 minutes    Edwin Dada, MD [facility] 02/01/2021, 1:59 PM     Please page though [place] or Epic secure chat:  For [employer], Adult nurse

## 2021-02-01 NOTE — TOC Progression Note (Addendum)
Transition of Care [facility]) - Progression Note    Patient Details  Name: BRADELY RUDIN MRN: [id number] Date of Birth: 11-May-1923  Transition [facility]) CM/SW [place], LCSW Phone Number: 02/01/2021, 11:16 AM  Clinical Narrative:   CSW confirmed with Magda Paganini at [facility] that she can offer pt a bed. Magda Paganini is starting auth today 2/18. Magda Paganini will notify CSW when Josem Kaufmann is obtained. TOC Supervisor and MD aware. CSW has also notified Granddaughter of update.    Expected Discharge Plan: [place] Barriers to Discharge: No Barriers Identified  Expected Discharge Plan and Services Expected Discharge Plan: [place] In-house Referral: Clinical Social Work   Post Acute Care Choice: [place] arrangements for the past 2 months: [place] Expected Discharge Date: 01/27/21                         HH Arranged: PT [place] Agency: Other - See comment ([facility]) Date [place]: 01/27/21 Time [place]: 1000 Representative spoke with at [place]: teresa cooper   Social Determinants of Health ([place]) Interventions    Readmission Risk Interventions Readmission Risk Prevention Plan 01/27/2021  Transportation Screening Complete  PCP or Specialist Appt within 5-7 Days Complete  Home Care Screening Complete  Medication Review (RN CM) Complete  Some recent data might be hidden

## 2021-02-01 NOTE — Progress Notes (Signed)
Physical Therapy Treatment Patient Details Name: Devin Washington MRN: [id number] DOB: [birth year] Today's Date: 02/01/2021    History of Present Illness Pt is a 87 y.o. male seen for evaluation of acute blood loss anemia, melena at the request of Dr. Mal Misty. Pt has a PMH of iron-deficiency anemia, hx of erosive esophagitis, Hx of peptic ulcer disease, HTN, s/p pacemaker in situ 2/2 SSS, paroxsymal atrial fibrillation, PVD, and Hx of prostate cancer. He presented to the [facility] ED via EMS on 02/07 for evaluation of altered mental status. Pt with syncopal episode in ED, also with hemoglobin 5.3, s/p transfusions.    PT Comments    Patient in bed, alert, agreeable to PT session. Reported R leg pain today. Pt exhibited more difficulty with bed mobility this session, still with fatigue and SOB quickly when mobilizing. HR in 110s with all mobility attempts as well. Supine to sit with supervision and extended time; sit <> stand from EOB and recliner, minA for physical boost or RW stabilization. Cued for hand placement to increase safety. He was able to transfer to chair ~46ft with RW and CGA. Pt up in chair, all needs in reach. Pt also give handouts for exercise program and reviewed. The patient would benefit from further skilled PT intervention to continue to progress towards goals. Recommendation remains appropriate.     Follow Up Recommendations  SNF     Equipment Recommendations  Other (comment) (defer to next level of care)    Recommendations for Other Services       Precautions / Restrictions Precautions Precautions: Fall Restrictions Weight Bearing Restrictions: No    Mobility  Bed Mobility Overal bed mobility: Needs Assistance Bed Mobility: Supine to Sit     Supine to sit: Supervision;HOB elevated     General bed mobility comments: increased time and effort    Transfers Overall transfer level: Needs assistance Equipment used: Rolling walker (2 wheeled) Transfers: Sit to/from  Stand Sit to Stand: Min assist         General transfer comment: minA from EOB and recliner twice this session. improved ability to perform independently with adequate UE support  Ambulation/Gait Ambulation/Gait assistance: Min assist Gait Distance (Feet): 3 Feet Assistive device: Rolling walker (2 wheeled) Gait Pattern/deviations: Trunk flexed;Antalgic;Narrow base of support;Step-to pattern     General Gait Details: Pt only able to take several steps to chair today   Stairs             Wheelchair Mobility    Modified Rankin (Stroke Patients Only)       Balance Overall balance assessment: Needs assistance Sitting-balance support: Feet supported;Bilateral upper extremity supported Sitting balance-Leahy Scale: Fair Sitting balance - Comments: no LOB sitting EOB Postural control: Posterior lean Standing balance support: Bilateral upper extremity supported Standing balance-Leahy Scale: Poor Standing balance comment: Pt unstable in standing with RW, relying heavily on BUE support                            Cognition Arousal/Alertness: Awake/alert Behavior During Therapy: WFL for tasks assessed/performed Overall Cognitive Status: Within Functional Limits for tasks assessed                                        Exercises      General Comments        Pertinent Vitals/Pain Pain Score: 3  Pain  Location: RLE Pain Descriptors / Indicators: Aching;Spasm;Sore;Other (Comment);Tingling Pain Intervention(s): Limited activity within patient's tolerance;Monitored during session;Repositioned    Home Living                      Prior Function            PT Goals (current goals can now be found in the care plan section) Progress towards PT goals: Progressing toward goals    Frequency    Min 2X/week      PT Plan Current plan remains appropriate    Co-evaluation              AM-PAC PT "6 Clicks" Mobility    Outcome Measure  Help needed turning from your back to your side while in a flat bed without using bedrails?: A Little Help needed moving from lying on your back to sitting on the side of a flat bed without using bedrails?: A Little Help needed moving to and from a bed to a chair (including a wheelchair)?: A Little Help needed standing up from a chair using your arms (e.g., wheelchair or bedside chair)?: A Little Help needed to walk in hospital room?: A Lot Help needed climbing 3-5 steps with a railing? : Total 6 Click Score: 15    End of Session Equipment Utilized During Treatment: Gait belt Activity Tolerance: Patient tolerated treatment well Patient left: with call bell/phone within reach;with chair alarm set;in chair Nurse Communication: Mobility status PT Visit Diagnosis: Other abnormalities of gait and mobility (R26.89);Muscle weakness (generalized) (M62.81);Difficulty in walking, not elsewhere classified (R26.2);Unsteadiness on feet (R26.81)     Time: 5537-4827 PT Time Calculation (min) (ACUTE ONLY): 25 min  Charges:  $Therapeutic Exercise: 23-37 mins                    Lieutenant Diego PT, DPT 12:24 PM,02/01/21

## 2021-02-01 NOTE — TOC Progression Note (Signed)
Transition of Care [facility]) - Progression Note    Patient Details  Name: IVY MERIWETHER MRN: [id number] Date of Birth: [birth year]  Transition [facility]) CM/SW [place], RN Phone Number: 02/01/2021, 9:50 AM  Clinical Narrative:   Patient grand daughter called this morning to report that she spoke with [place] admission representative Magda Paganini to discuss admission for grandfather/patient. Nicole Kindred states she gave representative [employer] information and approval given, will have a bed available on Monday, to contact SW to submit paperwork. Message given to Attending and SW.    Expected Discharge Plan: [place] Barriers to Discharge: No Barriers Identified  Expected Discharge Plan and Services Expected Discharge Plan: [place] In-house Referral: Clinical Social Work   Post Acute Care Choice: [place] arrangements for the past 2 months: [place] Expected Discharge Date: 01/27/21                         HH Arranged: PT [place] Agency: Other - See comment ([facility]) Date [place]: 01/27/21 Time [place]: 1000 Representative spoke with at [place]: teresa cooper   Social Determinants of Health ([place]) Interventions    Readmission Risk Interventions Readmission Risk Prevention Plan 01/27/2021  Transportation Screening Complete  PCP or Specialist Appt within 5-7 Days Complete  Home Care Screening Complete  Medication Review (RN CM) Complete  Some recent data might be hidden

## 2021-02-02 LAB — CBC
HCT: 22.8 % — ABNORMAL LOW (ref 39.0–52.0)
Hemoglobin: 7.2 g/dL — ABNORMAL LOW (ref 13.0–17.0)
MCH: 28.9 pg (ref 26.0–34.0)
MCHC: 31.6 g/dL (ref 30.0–36.0)
MCV: 91.6 fL (ref 80.0–100.0)
Platelets: 273 10*3/uL (ref 150–400)
RBC: 2.49 MIL/uL — ABNORMAL LOW (ref 4.22–5.81)
RDW: 18.5 % — ABNORMAL HIGH (ref 11.5–15.5)
WBC: 13.8 10*3/uL — ABNORMAL HIGH (ref 4.0–10.5)
nRBC: 0 % (ref 0.0–0.2)

## 2021-02-02 NOTE — Progress Notes (Signed)
IV leaking and removed.  Per Dr. Danae Chen patient may remain without IV access

## 2021-02-02 NOTE — Progress Notes (Signed)
[facility] PROGRESS NOTE    Devin Washington  UQJ:[id number] DOB: [birth year] DOA: 01/21/2021 PCP: Albina Billet, MD      Brief Narrative:  Devin Washington is a 87 y.o. M with HTN, SSS with PPM, PVD, peptic ulcer disease and erosive esophagitis, and iron deficiency anemia who presented with confusion and dark stools.  He presented to the infusion center for blood transfusion was found to be hypothermic, hypoxic and hypotensive.  In the ER lactate was 11, hemoglobin 6.             Assessment & Plan:  Sepsis due to enterocolitis, present on admission Antibiotics completed, enterocolitis resolved    Acute blood loss anemia on chronic iron deficiency anemia, anemia of chronic kidney disease Transfused 2 units on admission.  EPO repeated as scheduled 2/17.  Hemoglobin stable mid sevens since then, no further clinical bleeding -Trend hemoglobin in 3 days -Continue iron supplement   Paroxysmal atrial fibrillation Not on anticoagulants.  Heart rate normal. -Continue metoprolol   AKI on CKD stage IIIb Resolved  Peripheral vascular disease -Hold aspirin given persistent anemia  Referral edema Hypertension BP normal just on metoprolol, furosemide -Hold home amlodipine, continue furosemide  COPD No change in baseline symptoms. A little wheezing today. -Continue Anoro -Continue PRN albuterol           Disposition: Status is: Inpatient  Remains inpatient appropriate because:Unsafe d/c plan   Dispo: The patient is from: Home              Anticipated d/c is to: SNF              Anticipated d/c date is: 2 days              Patient currently is medically stable to d/c.   Difficult to place patient Yes    The patient was admitted with sepsis due to enterocolitis.  He has completed antibiotic treatment for this and it is resolved. He is significantly debilitated will require significant rehabilitation and physical therapy to return to  his prior level of function.  He lives alone and has no supervision at home and will require rehabilitation in a skilled nursing setting to return to her PLOF.  Still awaiting safe disposition.   Level of care: Med-Surg       MDM: The below labs and imaging reports were reviewed and summarized above.  Medication management as above.   DVT prophylaxis: SCDs Start: 01/21/21 1437  Code Status: DNR Family Communication:           Subjective: Patient has no complaints.  He has no change in his chronic dyspnea, no chest discomfort, no confusion, no fever.  Leg swelling unchanged.  No cough.       Objective: Vitals:   02/01/21 1622 02/01/21 2056 02/02/21 0451 02/02/21 0919  BP: 126/60 132/72 123/61 (!) 104/54  Pulse: 83 88 80 79  Resp: 16 18 20  (!) 22  Temp: 97.7 F (36.5 C) 98.4 F (36.9 C) 98.3 F (36.8 C) 98.1 F (36.7 C)  TempSrc: Oral Oral Oral Oral  SpO2: 100% 100% 100% 99%  Weight:      Height:        Intake/Output Summary (Last 24 hours) at 02/02/2021 1437 Last data filed at 02/02/2021 1400 Gross per 24 hour  Intake 480 ml  Output 1301 ml  Net -821 ml   Filed Weights   01/27/21 0341 01/28/21 0100 01/29/21 0317  Weight: 78.4 kg  78.8 kg 80.1 kg    Examination: General appearance: Adult male, lying in bed, no acute distress, interactive.     HEENT:    Skin:  Cardiac: Heart rate normal, regular, no murmurs, still 1+ lower extremity edema bilaterally Respiratory: Normal respiratory rate and rhythm, some inspiratory wheezing in upper lung fields, no rales. Abdomen: Abdomen soft without tenderness palpation or guarding MSK:  Neuro: Awake and alert, interactive, extremity strength normal, right leg weak, both legs overall fairly weak, speech fluent. Psych: Attention normal, affect normal, judgment is impaired slightly impaired.         Data Reviewed: I have personally reviewed following labs and imaging studies:  CBC: Recent Labs  Lab  03-Feb-2021 0500 01/28/21 0436 01/30/21 0521 02/02/21 0642  WBC 13.8* 12.9* 12.7* 13.8*  HGB 7.3* 7.4* 7.7* 7.2*  HCT 22.8* 23.2* 23.7* 22.8*  MCV 91.2 91.0 90.8 91.6  PLT 210 231 246 027   Basic Metabolic Panel: Recent Labs  Lab 2021-02-03 0500 01/28/21 0436  NA 138 139  K 3.4* 3.6  CL 108 107  CO2 23 22  GLUCOSE 116* 111*  BUN 36* 35*  CREATININE 1.95* 2.06*  CALCIUM 7.9* 8.2*   GFR: Estimated Creatinine Clearance: 22.5 mL/min (A) (by C-G formula based on SCr of 2.06 mg/dL (H)). Liver Function Tests: Recent Labs  Lab 2021-02-03 0500 01/28/21 0436  AST 19 16  ALT 20 16  ALKPHOS 42 45  BILITOT 0.9 0.8  PROT 4.8* 4.8*  ALBUMIN 2.4* 2.3*   No results for input(s): LIPASE, AMYLASE in the last 168 hours. No results for input(s): AMMONIA in the last 168 hours. Coagulation Profile: No results for input(s): INR, PROTIME in the last 168 hours. Cardiac Enzymes: No results for input(s): CKTOTAL, CKMB, CKMBINDEX, TROPONINI in the last 168 hours. BNP (last 3 results) No results for input(s): PROBNP in the last 8760 hours. HbA1C: No results for input(s): HGBA1C in the last 72 hours. CBG: No results for input(s): GLUCAP in the last 168 hours. Lipid Profile: No results for input(s): CHOL, HDL, LDLCALC, TRIG, CHOLHDL, LDLDIRECT in the last 72 hours. Thyroid Function Tests: No results for input(s): TSH, T4TOTAL, FREET4, T3FREE, THYROIDAB in the last 72 hours. Anemia Panel: No results for input(s): VITAMINB12, FOLATE, FERRITIN, TIBC, IRON, RETICCTPCT in the last 72 hours. Urine analysis:    Component Value Date/Time   COLORURINE YELLOW (A) 01/21/2021 1055   APPEARANCEUR HAZY (A) 01/21/2021 1055   LABSPEC 1.016 01/21/2021 1055   PHURINE 5.0 01/21/2021 1055   GLUCOSEU 50 (A) 01/21/2021 1055   HGBUR LARGE (A) 01/21/2021 1055   BILIRUBINUR NEGATIVE 01/21/2021 1055   KETONESUR NEGATIVE 01/21/2021 1055   PROTEINUR 100 (A) 01/21/2021 1055   NITRITE NEGATIVE 01/21/2021 1055    LEUKOCYTESUR NEGATIVE 01/21/2021 1055   Sepsis Labs: @LABRCNTIP (procalcitonin:4,lacticacidven:4)  ) Recent Results (from the past 240 hour(s))  SARS CORONAVIRUS 2 (TAT 6-24 HRS) Nasopharyngeal Nasopharyngeal Swab     Status: None   Collection Time: 01/31/21  2:23 PM   Specimen: Nasopharyngeal Swab  Result Value Ref Range Status   SARS Coronavirus 2 NEGATIVE NEGATIVE Final    Comment: (NOTE) SARS-CoV-2 target nucleic acids are NOT DETECTED.  The SARS-CoV-2 RNA is generally detectable in upper and lower respiratory specimens during the acute phase of infection. Negative results do not preclude SARS-CoV-2 infection, do not rule out co-infections with other pathogens, and should not be used as the sole basis for treatment or other patient management decisions. Negative results must be combined  with clinical observations, patient history, and epidemiological information. The expected result is Negative.  Fact Sheet for Patients: SugarRoll.be  Fact Sheet for Healthcare Providers: https://www.woods-mathews.com/  This test is not yet approved or cleared by the [place] FDA and  has been authorized for detection and/or diagnosis of SARS-CoV-2 by FDA under an Emergency Use Authorization (EUA). This EUA will remain  in effect (meaning this test can be used) for the duration of the COVID-19 declaration under Se ction 564(b)(1) of the Act, 21 U.S.C. section 360bbb-3(b)(1), unless the authorization is terminated or revoked sooner.  Performed at [facility], [place] [address], [place], [place] [address]          Radiology Studies: No results found.      Scheduled Meds: . sodium chloride   Intravenous Once  . acetaminophen  325 mg Oral BID  . aspirin EC  81 mg Oral Daily  . feeding supplement  1 Bottle Oral Q24H  . ferrous sulfate  325 mg Oral Q breakfast  . furosemide  20 mg Oral Daily  . metoprolol tartrate  50 mg Oral  BID  . pantoprazole  40 mg Oral BID AC  . simvastatin  40 mg Oral QHS  . sodium chloride flush  3 mL Intravenous Q12H  . cyanocobalamin  1,000 mcg Oral Daily   Continuous Infusions: . sodium chloride 250 mL (01/28/21 0617)     LOS: 12 days    Time spent: 25 minutes    Edwin Dada, MD [facility] 02/02/2021, 2:37 PM     Please page though [place] or Epic secure chat:  For [employer], Adult nurse

## 2021-02-03 NOTE — Progress Notes (Signed)
[facility] PROGRESS NOTE    GRABIEL Washington  YTK:[id number] DOB: 09-29-1923 DOA: 01/21/2021 PCP: Albina Billet, MD      Brief Narrative:  [name] is a 87 y.o. M with HTN, SSS with PPM, PVD, peptic ulcer disease and erosive esophagitis, and iron deficiency anemia who presented with confusion and dark stools.  He presented to the infusion center for blood transfusion was found to be hypothermic, hypoxic and hypotensive.  In the ER lactate was 11, hemoglobin 6.             Assessment & Plan:  Sepsis due to enterocolitis, present on admission Antibiotics completed, enterocolitis resolved    Acute blood loss anemia on chronic iron deficiency anemia, anemia of chronic kidney disease Transfused 2 units on admission.  EPO repeated as scheduled 2/17.  Hemoglobin stable mid sevens since then, no further clinical bleeding -Trend hemoglobin in 3 days -Continue iron supplement   Paroxysmal atrial fibrillation Not on anticoagulants.  Heart rate normal. -Continue metoprolol   AKI on CKD stage IIIb Resolved  Peripheral vascular disease -Hold aspirin given persistent anemia  Peripheral edema Hypertension Continue metoprolol, furosemide -Hold home amlodipine  Chronic obstructive pulmonary disease Asymptomatic -Continue Anoro -Continue PRN albuterol           Disposition: Status is: Inpatient  Remains inpatient appropriate because:Unsafe d/c plan   Dispo: The patient is from: Home              Anticipated d/c is to: SNF              Anticipated d/c date is: 2 days              Patient currently is medically stable to d/c.   Difficult to place patient Yes    The patient was admitted with sepsis due to enterocolitis.  He has completed antibiotic treatment for this and it is resolved. He is significantly debilitated will require significant rehabilitation and physical therapy to return to his prior level of function.  He lives  alone and has no supervision at home and will require rehabilitation in a skilled nursing setting to return to her PLOF.  Still awaiting safe disposition.   Level of care: Med-Surg       MDM: The below labs and imaging reports were reviewed and summarized above.  Medication management as above.   DVT prophylaxis: SCDs Start: 01/21/21 1437  Code Status: DNR Family Communication:           Subjective: Patient feels well, no chest pain, headache, abdominal pain, dyspnea.       Objective: Vitals:   02/03/21 0018 02/03/21 0427 02/03/21 0833 02/03/21 1118  BP: (!) 131/59 134/66 131/62 (!) 141/68  Pulse: 64 70 75 68  Resp: 16 16 (!) 22 18  Temp: 98.4 F (36.9 C) 98 F (36.7 C) 98.5 F (36.9 C) 98.5 F (36.9 C)  TempSrc:  Oral Oral Oral  SpO2: 100% 100% 99% 100%  Weight:      Height:        Intake/Output Summary (Last 24 hours) at 02/03/2021 1607 Last data filed at 02/03/2021 1344 Gross per 24 hour  Intake 980 ml  Output 350 ml  Net 630 ml   Filed Weights   01/27/21 0341 01/28/21 0100 01/29/21 0317  Weight: 78.4 kg 78.8 kg 80.1 kg    Examination: General appearance: Elderly adult male, eating lunch, no acute distress    HEENT:    Skin:  Cardiac: RRR, no murmurs, 1-2+ lower extremity edema, bilateral Respiratory: Normal respiratory rate and rhythm, no wheezing or rales. Abdomen:   MSK:  Neuro: Generally weak. Psych: Tension normal, affect pleasant, judgment insight appear impaired          Data Reviewed: I have personally reviewed following labs and imaging studies:  CBC: Recent Labs  Lab 01/28/21 0436 01/30/21 0521 02/02/21 0642  WBC 12.9* 12.7* 13.8*  HGB 7.4* 7.7* 7.2*  HCT 23.2* 23.7* 22.8*  MCV 91.0 90.8 91.6  PLT 231 246 248   Basic Metabolic Panel: Recent Labs  Lab 01/28/21 0436  NA 139  K 3.6  CL 107  CO2 22  GLUCOSE 111*  BUN 35*  CREATININE 2.06*  CALCIUM 8.2*   GFR: Estimated Creatinine Clearance: 22.5  mL/min (A) (by C-G formula based on SCr of 2.06 mg/dL (H)). Liver Function Tests: Recent Labs  Lab 01/28/21 0436  AST 16  ALT 16  ALKPHOS 45  BILITOT 0.8  PROT 4.8*  ALBUMIN 2.3*   No results for input(s): LIPASE, AMYLASE in the last 168 hours. No results for input(s): AMMONIA in the last 168 hours. Coagulation Profile: No results for input(s): INR, PROTIME in the last 168 hours. Cardiac Enzymes: No results for input(s): CKTOTAL, CKMB, CKMBINDEX, TROPONINI in the last 168 hours. BNP (last 3 results) No results for input(s): PROBNP in the last 8760 hours. HbA1C: No results for input(s): HGBA1C in the last 72 hours. CBG: No results for input(s): GLUCAP in the last 168 hours. Lipid Profile: No results for input(s): CHOL, HDL, LDLCALC, TRIG, CHOLHDL, LDLDIRECT in the last 72 hours. Thyroid Function Tests: No results for input(s): TSH, T4TOTAL, FREET4, T3FREE, THYROIDAB in the last 72 hours. Anemia Panel: No results for input(s): VITAMINB12, FOLATE, FERRITIN, TIBC, IRON, RETICCTPCT in the last 72 hours. Urine analysis:    Component Value Date/Time   COLORURINE YELLOW (A) 01/21/2021 1055   APPEARANCEUR HAZY (A) 01/21/2021 1055   LABSPEC 1.016 01/21/2021 1055   PHURINE 5.0 01/21/2021 1055   GLUCOSEU 50 (A) 01/21/2021 1055   HGBUR LARGE (A) 01/21/2021 1055   BILIRUBINUR NEGATIVE 01/21/2021 1055   KETONESUR NEGATIVE 01/21/2021 1055   PROTEINUR 100 (A) 01/21/2021 1055   NITRITE NEGATIVE 01/21/2021 1055   LEUKOCYTESUR NEGATIVE 01/21/2021 1055   Sepsis Labs: @LABRCNTIP (procalcitonin:4,lacticacidven:4)  ) Recent Results (from the past 240 hour(s))  SARS CORONAVIRUS 2 (TAT 6-24 HRS) Nasopharyngeal Nasopharyngeal Swab     Status: None   Collection Time: 01/31/21  2:23 PM   Specimen: Nasopharyngeal Swab  Result Value Ref Range Status   SARS Coronavirus 2 NEGATIVE NEGATIVE Final    Comment: (NOTE) SARS-CoV-2 target nucleic acids are NOT DETECTED.  The SARS-CoV-2 RNA is  generally detectable in upper and lower respiratory specimens during the acute phase of infection. Negative results do not preclude SARS-CoV-2 infection, do not rule out co-infections with other pathogens, and should not be used as the sole basis for treatment or other patient management decisions. Negative results must be combined with clinical observations, patient history, and epidemiological information. The expected result is Negative.  Fact Sheet for Patients: SugarRoll.be  Fact Sheet for Healthcare Providers: https://www.woods-mathews.com/  This test is not yet approved or cleared by the [place] FDA and  has been authorized for detection and/or diagnosis of SARS-CoV-2 by FDA under an Emergency Use Authorization (EUA). This EUA will remain  in effect (meaning this test can be used) for the duration of the COVID-19 declaration under Se ction 564(b)(1)  of the Act, 21 U.S.C. section 360bbb-3(b)(1), unless the authorization is terminated or revoked sooner.  Performed at [facility], [place] [address] [place], [place] [address]          Radiology Studies: No results found.      Scheduled Meds: . sodium chloride   Intravenous Once  . acetaminophen  325 mg Oral BID  . aspirin EC  81 mg Oral Daily  . feeding supplement  1 Bottle Oral Q24H  . ferrous sulfate  325 mg Oral Q breakfast  . furosemide  20 mg Oral Daily  . metoprolol tartrate  50 mg Oral BID  . pantoprazole  40 mg Oral BID AC  . simvastatin  40 mg Oral QHS  . sodium chloride flush  3 mL Intravenous Q12H  . cyanocobalamin  1,000 mcg Oral Daily   Continuous Infusions: . sodium chloride 250 mL (01/28/21 0617)     LOS: 13 days    Time spent: 15 minutes    Edwin Dada, MD [facility] 02/03/2021, 4:07 PM     Please page though [place] or Epic secure chat:  For [employer], Adult nurse

## 2021-02-04 LAB — CBC
HCT: 25.5 % — ABNORMAL LOW (ref 39.0–52.0)
Hemoglobin: 7.9 g/dL — ABNORMAL LOW (ref 13.0–17.0)
MCH: 28.4 pg (ref 26.0–34.0)
MCHC: 31 g/dL (ref 30.0–36.0)
MCV: 91.7 fL (ref 80.0–100.0)
Platelets: 364 10*3/uL (ref 150–400)
RBC: 2.78 MIL/uL — ABNORMAL LOW (ref 4.22–5.81)
RDW: 18.4 % — ABNORMAL HIGH (ref 11.5–15.5)
WBC: 12.8 10*3/uL — ABNORMAL HIGH (ref 4.0–10.5)
nRBC: 0 % (ref 0.0–0.2)

## 2021-02-04 LAB — COMPREHENSIVE METABOLIC PANEL
ALT: 16 U/L (ref 0–44)
AST: 23 U/L (ref 15–41)
Albumin: 2.3 g/dL — ABNORMAL LOW (ref 3.5–5.0)
Alkaline Phosphatase: 56 U/L (ref 38–126)
Anion gap: 8 (ref 5–15)
BUN: 64 mg/dL — ABNORMAL HIGH (ref 8–23)
CO2: 23 mmol/L (ref 22–32)
Calcium: 8.9 mg/dL (ref 8.9–10.3)
Chloride: 107 mmol/L (ref 98–111)
Creatinine, Ser: 2.07 mg/dL — ABNORMAL HIGH (ref 0.61–1.24)
GFR, Estimated: 29 mL/min — ABNORMAL LOW (ref >60)
Glucose, Bld: 103 mg/dL — ABNORMAL HIGH (ref 70–99)
Potassium: 4.1 mmol/L (ref 3.5–5.1)
Sodium: 138 mmol/L (ref 135–145)
Total Bilirubin: 0.6 mg/dL (ref 0.3–1.2)
Total Protein: 5.5 g/dL — ABNORMAL LOW (ref 6.5–8.1)

## 2021-02-04 LAB — RESP PANEL BY RT-PCR (FLU A&B, COVID) ARPGX2
Influenza A by PCR: NEGATIVE
Influenza B by PCR: NEGATIVE
SARS Coronavirus 2 by RT PCR: NEGATIVE

## 2021-02-04 LAB — PHOSPHORUS: Phosphorus: 3.1 mg/dL (ref 2.5–4.6)

## 2021-02-04 LAB — MAGNESIUM: Magnesium: 2.1 mg/dL (ref 1.7–2.4)

## 2021-02-04 NOTE — Progress Notes (Signed)
Physical Therapy Treatment Patient Details Name: Devin Washington MRN: [id number] DOB: Apr 19, 1923 Today's Date: 02/04/2021    History of Present Illness Pt is a 87 y.o. male seen for evaluation of acute blood loss anemia, melena at the request of Dr. Mal Misty. Pt has a PMH of iron-deficiency anemia, hx of erosive esophagitis, Hx of peptic ulcer disease, HTN, s/p pacemaker in situ 2/2 SSS, paroxsymal atrial fibrillation, PVD, and Hx of prostate cancer. He presented to the [facility] ED via EMS on 02/07 for evaluation of altered mental status. Pt with syncopal episode in ED, also with hemoglobin 5.3, s/p transfusions.    PT Comments    Pt pleasant & agreeable to tx. Pt deconditioned & requires rest breaks throughout session 2/2 SOB & fatigue. Pt performs BLE strengthening exercises at noted below & is able to ambulate 2 times for a max of 6 ft forwards & backwards with RW. Pt would benefit from STR upon d/c to maximize independence with functional mobility & reduce fall risk prior to return home.    Follow Up Recommendations  SNF     Equipment Recommendations  None recommended by PT    Recommendations for Other Services       Precautions / Restrictions Precautions Precautions: Fall Restrictions Weight Bearing Restrictions: No    Mobility  Bed Mobility Overal bed mobility: Needs Assistance Bed Mobility: Supine to Sit     Supine to sit: Supervision;HOB elevated     General bed mobility comments: use of bed rails, significantly extra time & rest breaks throughout movement    Transfers Overall transfer level: Needs assistance Equipment used: Rolling walker (2 wheeled) Transfers: Sit to/from [facility] Sit to Stand: Min assist;Mod assist Stand pivot transfers: Min assist       General transfer comment: pt with posterior lean during transfer, relies heavily on BUE to push himself to standing, assistance to boost to standing  Ambulation/Gait Ambulation/Gait  assistance: Min assist Gait Distance (Feet):  (4 ft forwards & backwards + 6 ft forwards & backwards) Assistive device: Rolling walker (2 wheeled) Gait Pattern/deviations: Decreased step length - left;Decreased step length - right;Decreased stride length;Decreased dorsiflexion - left;Decreased dorsiflexion - right Gait velocity: decreased   General Gait Details: decreased foot clearance, extreme trunk flexion   Stairs             Wheelchair Mobility    Modified Rankin (Stroke Patients Only)       Balance Overall balance assessment: Needs assistance Sitting-balance support: Feet supported;Bilateral upper extremity supported Sitting balance-Leahy Scale: Good Sitting balance - Comments: no LOB sitting EOB Postural control: Posterior lean Standing balance support: Bilateral upper extremity supported Standing balance-Leahy Scale: Poor Standing balance comment: BUE reliance on RW during standing/transfers                            Cognition Arousal/Alertness: Awake/alert Behavior During Therapy: WFL for tasks assessed/performed Overall Cognitive Status: Within Functional Limits for tasks assessed                                 General Comments: HOH. Extremely pleasant but very deconditioned.      Exercises General Exercises - Lower Extremity Long Arc Quad: AROM;Strengthening;Right;Left;10 reps;Seated Hip ABduction/ADduction: AROM;Strengthening;Both;10 reps;Seated (pillow squeezes hip adduction) Hip Flexion/Marching: AROM;Strengthening;Right;Left;10 reps;Seated    General Comments General comments (skin integrity, edema, etc.): Pt on room air, SPO2 100% HR  73 bpm but pt does not SOB & requires rest breaks throughout session; pt with BLE edema (L>R) with pt reporting this is common since surgery ~6 months ago & notes it flucuates but does not cause pain      Pertinent Vitals/Pain Pain Assessment: No/denies pain    Home Living                       Prior Function            PT Goals (current goals can now be found in the care plan section) Acute Rehab PT Goals Patient Stated Goal: to get his strength back PT Goal Formulation: With patient Time For Goal Achievement: 02/07/21 Potential to Achieve Goals: Good Progress towards PT goals: Progressing toward goals    Frequency    Min 2X/week      PT Plan Current plan remains appropriate    Co-evaluation              AM-PAC PT "6 Clicks" Mobility   Outcome Measure  Help needed turning from your back to your side while in a flat bed without using bedrails?: A Little Help needed moving from lying on your back to sitting on the side of a flat bed without using bedrails?: A Little Help needed moving to and from a bed to a chair (including a wheelchair)?: A Little Help needed standing up from a chair using your arms (e.g., wheelchair or bedside chair)?: A Little Help needed to walk in hospital room?: A Little Help needed climbing 3-5 steps with a railing? : A Lot 6 Click Score: 17    End of Session Equipment Utilized During Treatment: Gait belt Activity Tolerance: Patient tolerated treatment well Patient left: with call bell/phone within reach;in chair;with chair alarm set Nurse Communication: Mobility status PT Visit Diagnosis: Other abnormalities of gait and mobility (R26.89);Muscle weakness (generalized) (M62.81);Difficulty in walking, not elsewhere classified (R26.2);Unsteadiness on feet (R26.81)     Time: 7209-4709 PT Time Calculation (min) (ACUTE ONLY): 24 min  Charges:  $Therapeutic Activity: 23-37 mins                     [name], PT, DPT 02/04/21, 11:26 AM    [name] 02/04/2021, 11:25 AM

## 2021-02-04 NOTE — Progress Notes (Signed)
Mobility Specialist - Progress Note   02/04/21 1600  Mobility  Activity Transferred:  Chair to bed  Level of Assistance Moderate assist, patient does 50-74%  Distance Ambulated (ft) 3 ft  Mobility performed by Mobility specialist    Pt was received partially in recliner on arrival. Nursing staff contacted for assistance with transfer. Pt reports no pain, nausea, and fatigue. Pt was able to stand with modA +2. Pt assisted back to bedside with nursing staff still present at exit.    Kathee Delton Mobility Specialist 02/04/21, 4:15 PM

## 2021-02-04 NOTE — Progress Notes (Signed)
PROGRESS NOTE    Devin Washington  YKZ:[id number] DOB: [birth year] DOA: 01/21/2021 PCP: Albina Billet, MD   Brief Narrative:  This 87 years old male with PMH of HTN, SSS with PPM, PVD, peptic ulcer disease,  erosive esophagitis and iron deficiency anemia presents in the emergency department with acute confusion and dark stools.  He presented to the infusion center for blood transfusion and was found to be hypothermic hypoxic and hypotensive.  Patient is found to have a lactic acid of 11 with a hemoglobin of 6.0 in the ER.  He is admitted for severe sepsis secondary to enterocolitis,  He has completed antibiotics course,  enterocolitis has resolved.  GI was consulted, Patient underwent EGD,  no active bleeding found.  Patient has received 2 units of blood transfusion so far.  Hemoglobin remained stable.  Patient is awaiting discharge in  skilled nursing facility.  Assessment & Plan:   Principal Problem:   Severe sepsis ([place]) Active Problems:   CKD (chronic kidney disease), stage III (HCC)   Hypertension   Symptomatic anemia   CHF (congestive heart failure) ([place])   Peripheral artery disease (HCC)   Anemia in chronic kidney disease   Sepsis due to enterocolitis, present on admission; > Resolved. Patient presented with tachycardia, tachypnea, hypothermia with lactic acid of 11. Antibiotics completed, enterocolitis resolved. Sepsis physiology resolved   Acute blood loss anemia on chronic iron deficiency anemia, anemia of chronic kidney disease: Transfused 2 units on admission. EPO repeated as scheduled 2/17. Hemoglobin remains stable in mid sevens since then, no further clinical bleeding. -Trend hemoglobin in 3 days -Continue iron supplement.   Paroxysmal atrial fibrillation Not on anticoagulants.  Heart rate normal. -Continue metoprolol.   AKI on CKD stage IIIb. > stable Resolved.  Peripheral vascular disease -Hold aspirin given persistent anemia  Peripheral  edema: Continue furosemide.  Hypertension Continue metoprolol. Hold home amlodipine.  Chronic obstructive pulmonary disease  - Asymptomatic -Continue Anoro -Continue PRN albuterol    DVT prophylaxis:  SCDs Code Status: DNR Family Communication: No family at bed side. Disposition Plan:   Status is: Inpatient  Remains inpatient appropriate because:Inpatient level of care appropriate due to severity of illness   Dispo: The patient is from: Home              Anticipated d/c is to: SNF              Anticipated d/c date is: 2 days              Patient currently is medically stable for discharge.   Difficult to place patient Yes  He has completed antibiotic treatment for sepsis and it is resolved. He is significantly debilitated will require significant rehabilitation and physical therapy to return to his prior level of function.  He lives alone and has no supervision at home and will require rehabilitation in a skilled nursing setting to return to her PLOF.  Still awaiting safe disposition.   Consultants:    Gastroneterology  Procedures: EGD. Antimicrobials:  Anti-infectives (From admission, onward)   Start     Dose/Rate Route Frequency Ordered Stop   01/23/21 0903  vancomycin variable dose per unstable renal function (pharmacist dosing)  Status:  Discontinued         Does not apply See admin instructions 01/23/21 0903 01/25/21 1406   01/22/21 1200  ceFEPIme (MAXIPIME) 2 g in sodium chloride 0.9 % 100 mL IVPB        2 g 200 mL/hr  over 30 Minutes Intravenous Every 24 hours 01/21/21 1649 01/28/21 2359   01/21/21 1600  vancomycin (VANCOCIN) IVPB 1000 mg/200 mL premix        1,000 mg 200 mL/hr over 60 Minutes Intravenous  Once 01/21/21 1438 01/21/21 2107   01/21/21 1500  metroNIDAZOLE (FLAGYL) IVPB 500 mg  Status:  Discontinued        500 mg 100 mL/hr over 60 Minutes Intravenous Every 8 hours 01/21/21 1438 01/28/21 1312   01/21/21 1445  ceFEPIme (MAXIPIME) 2 g in sodium  chloride 0.9 % 100 mL IVPB  Status:  Discontinued        2 g 200 mL/hr over 30 Minutes Intravenous  Once 01/21/21 1438 01/21/21 1440   01/21/21 1230  vancomycin (VANCOREADY) IVPB 500 mg/100 mL        500 mg 100 mL/hr over 60 Minutes Intravenous  Once 01/21/21 1141 01/21/21 1355   01/21/21 1130  ceFEPIme (MAXIPIME) 2 g in sodium chloride 0.9 % 100 mL IVPB        2 g 200 mL/hr over 30 Minutes Intravenous  Once 01/21/21 1129 01/21/21 1228   01/21/21 1130  vancomycin (VANCOCIN) IVPB 1000 mg/200 mL premix        1,000 mg 200 mL/hr over 60 Minutes Intravenous  Once 01/21/21 1129 01/21/21 1256      Subjective: Patient was seen and examined at bedside.  Overnight events noted.  Patient reports feeling better,  denies any blood  In the stools. He reports feeling very weak,  states not able to get out of the bed.  Objective: Vitals:   02/03/21 2116 02/04/21 0518 02/04/21 0850 02/04/21 1114  BP: 129/61 (!) 125/58 (!) 148/74 124/60  Pulse: 80 72 77 65  Resp: (!) 24 20 18 18   Temp: 98.4 F (36.9 C) 98.4 F (36.9 C) 98 F (36.7 C) 97.7 F (36.5 C)  TempSrc: Oral Oral Oral Oral  SpO2: 100% 100% 100% 99%  Weight:      Height:        Intake/Output Summary (Last 24 hours) at 02/04/2021 1348 Last data filed at 02/04/2021 0519 Gross per 24 hour  Intake 240 ml  Output 675 ml  Net -435 ml   Filed Weights   01/27/21 0341 01/28/21 0100 01/29/21 0317  Weight: 78.4 kg 78.8 kg 80.1 kg    Examination:  General exam: Appears calm and comfortable, not in any acute distress. Respiratory system: Clear to auscultation. Respiratory effort normal. Cardiovascular system: S1 & S2 heard, RRR. No JVD, murmurs, rubs, gallops or clicks. No pedal edema. Gastrointestinal system: Abdomen is nondistended, soft and nontender. No organomegaly or masses felt. Normal bowel sounds heard. Central nervous system: Alert and oriented. No focal neurological deficits. Extremities: Symmetric 5 x 5 power. Skin: No  rashes, lesions or ulcers Psychiatry: Judgement and insight appear normal. Mood & affect appropriate.     Data Reviewed: I have personally reviewed following labs and imaging studies  CBC: Recent Labs  Lab 01/30/21 0521 02/02/21 0642 02/04/21 0904  WBC 12.7* 13.8* 12.8*  HGB 7.7* 7.2* 7.9*  HCT 23.7* 22.8* 25.5*  MCV 90.8 91.6 91.7  PLT 246 273 163   Basic Metabolic Panel: Recent Labs  Lab 02/04/21 0904  NA 138  K 4.1  CL 107  CO2 23  GLUCOSE 103*  BUN 64*  CREATININE 2.07*  CALCIUM 8.9  MG 2.1  PHOS 3.1   GFR: Estimated Creatinine Clearance: 22.4 mL/min (A) (by C-G formula based on SCr of  2.07 mg/dL (H)). Liver Function Tests: Recent Labs  Lab 02/04/21 0904  AST 23  ALT 16  ALKPHOS 56  BILITOT 0.6  PROT 5.5*  ALBUMIN 2.3*   No results for input(s): LIPASE, AMYLASE in the last 168 hours. No results for input(s): AMMONIA in the last 168 hours. Coagulation Profile: No results for input(s): INR, PROTIME in the last 168 hours. Cardiac Enzymes: No results for input(s): CKTOTAL, CKMB, CKMBINDEX, TROPONINI in the last 168 hours. BNP (last 3 results) No results for input(s): PROBNP in the last 8760 hours. HbA1C: No results for input(s): HGBA1C in the last 72 hours. CBG: No results for input(s): GLUCAP in the last 168 hours. Lipid Profile: No results for input(s): CHOL, HDL, LDLCALC, TRIG, CHOLHDL, LDLDIRECT in the last 72 hours. Thyroid Function Tests: No results for input(s): TSH, T4TOTAL, FREET4, T3FREE, THYROIDAB in the last 72 hours. Anemia Panel: No results for input(s): VITAMINB12, FOLATE, FERRITIN, TIBC, IRON, RETICCTPCT in the last 72 hours. Sepsis Labs: No results for input(s): PROCALCITON, LATICACIDVEN in the last 168 hours.  Recent Results (from the past 240 hour(s))  SARS CORONAVIRUS 2 (TAT 6-24 HRS) Nasopharyngeal Nasopharyngeal Swab     Status: None   Collection Time: 01/31/21  2:23 PM   Specimen: Nasopharyngeal Swab  Result Value Ref  Range Status   SARS Coronavirus 2 NEGATIVE NEGATIVE Final    Comment: (NOTE) SARS-CoV-2 target nucleic acids are NOT DETECTED.  The SARS-CoV-2 RNA is generally detectable in upper and lower respiratory specimens during the acute phase of infection. Negative results do not preclude SARS-CoV-2 infection, do not rule out co-infections with other pathogens, and should not be used as the sole basis for treatment or other patient management decisions. Negative results must be combined with clinical observations, patient history, and epidemiological information. The expected result is Negative.  Fact Sheet for Patients: SugarRoll.be  Fact Sheet for Healthcare Providers: https://www.woods-mathews.com/  This test is not yet approved or cleared by the [place] FDA and  has been authorized for detection and/or diagnosis of SARS-CoV-2 by FDA under an Emergency Use Authorization (EUA). This EUA will remain  in effect (meaning this test can be used) for the duration of the COVID-19 declaration under Se ction 564(b)(1) of the Act, 21 U.S.C. section 360bbb-3(b)(1), unless the authorization is terminated or revoked sooner.  Performed at [facility], [place] [address] [place], [place] [address]   Resp Panel by RT-PCR (Flu A&B, Covid) Nasopharyngeal Swab     Status: None   Collection Time: 02/04/21 11:49 AM   Specimen: Nasopharyngeal Swab; Nasopharyngeal(NP) swabs in vial transport medium  Result Value Ref Range Status   SARS Coronavirus 2 by RT PCR NEGATIVE NEGATIVE Final    Comment: (NOTE) SARS-CoV-2 target nucleic acids are NOT DETECTED.  The SARS-CoV-2 RNA is generally detectable in upper respiratory specimens during the acute phase of infection. The lowest concentration of SARS-CoV-2 viral copies this assay can detect is 138 copies/mL. A negative result does not preclude SARS-Cov-2 infection and should not be used as the sole basis for  treatment or other patient management decisions. A negative result may occur with  improper specimen collection/handling, submission of specimen other than nasopharyngeal swab, presence of viral mutation(s) within the areas targeted by this assay, and inadequate number of viral copies(<138 copies/mL). A negative result must be combined with clinical observations, patient history, and epidemiological information. The expected result is Negative.  Fact Sheet for Patients:  EntrepreneurPulse.com.au  Fact Sheet for Healthcare Providers:  IncredibleEmployment.be  This test is no t yet approved or cleared by the [place] and  has been authorized for detection and/or diagnosis of SARS-CoV-2 by FDA under an Emergency Use Authorization (EUA). This EUA will remain  in effect (meaning this test can be used) for the duration of the COVID-19 declaration under Section 564(b)(1) of the Act, 21 U.S.C.section 360bbb-3(b)(1), unless the authorization is terminated  or revoked sooner.       Influenza A by PCR NEGATIVE NEGATIVE Final   Influenza B by PCR NEGATIVE NEGATIVE Final    Comment: (NOTE) The Xpert Xpress SARS-CoV-2/FLU/RSV plus assay is intended as an aid in the diagnosis of influenza from Nasopharyngeal swab specimens and should not be used as a sole basis for treatment. Nasal washings and aspirates are unacceptable for Xpert Xpress SARS-CoV-2/FLU/RSV testing.  Fact Sheet for Patients: EntrepreneurPulse.com.au  Fact Sheet for Healthcare Providers: IncredibleEmployment.be  This test is not yet approved or cleared by the [place] FDA and has been authorized for detection and/or diagnosis of SARS-CoV-2 by FDA under an Emergency Use Authorization (EUA). This EUA will remain in effect (meaning this test can be used) for the duration of the COVID-19 declaration under Section 564(b)(1) of the Act, 21  U.S.C. section 360bbb-3(b)(1), unless the authorization is terminated or revoked.  Performed at [facility], [address] [place], [place] [address]     Radiology Studies: No results found.  Scheduled Meds: . sodium chloride   Intravenous Once  . acetaminophen  325 mg Oral BID  . aspirin EC  81 mg Oral Daily  . feeding supplement  1 Bottle Oral Q24H  . ferrous sulfate  325 mg Oral Q breakfast  . furosemide  20 mg Oral Daily  . metoprolol tartrate  50 mg Oral BID  . pantoprazole  40 mg Oral BID AC  . simvastatin  40 mg Oral QHS  . sodium chloride flush  3 mL Intravenous Q12H  . cyanocobalamin  1,000 mcg Oral Daily   Continuous Infusions: . sodium chloride 250 mL (01/28/21 0617)     LOS: 14 days    Time spent: 25 mins    Estelita Iten, MD [facility]   If 7PM-7AM, please contact night-coverage

## 2021-02-04 NOTE — Progress Notes (Signed)
Occupational Therapy Treatment Patient Details Name: Devin Washington MRN: [id number] DOB: 07-11-1923 Today's Date: 02/04/2021    History of present illness Pt is a 87 y.o. male seen for evaluation of acute blood loss anemia, melena at the request of Dr. Mal Misty. Pt has a PMH of iron-deficiency anemia, hx of erosive esophagitis, Hx of peptic ulcer disease, HTN, s/p pacemaker in situ 2/2 SSS, paroxsymal atrial fibrillation, PVD, and Hx of prostate cancer. He presented to the [facility] ED via EMS on 02/07 for evaluation of altered mental status. Pt with syncopal episode in ED, also with hemoglobin 5.3, s/p transfusions.   OT comments  Upon entering the room, pt supine in bed with no c/o pain and agreeable to OT intervention. Session focused on B UE HEP with use of level 1 resistive theraband. OT demonstrating exercise with pt returning demonstrations with min cuing for proper technique. Pt performed 3 sets of 5 chest pulls, shoulder diagonals, shoulder elevation, bicep curls, and alternating punches with focus on pursed lip breathing. Pt fatigues very quickly and reports feeling SOB with vitals being WNLs. Pt also demonstrates ability to manage urinal independently with therapist emptying it before leaving. All needs within reach and theraband left with pt to encourage him to use to continue endeavors in strengthening. Pt continues to benefit from OT intervention.    Follow Up Recommendations  SNF    Equipment Recommendations  3 in 1 bedside commode       Precautions / Restrictions Precautions Precautions: Fall              ADL either performed or assessed with clinical judgement        Vision Patient Visual Report: No change from baseline            Cognition Arousal/Alertness: Awake/alert Behavior During Therapy: WFL for tasks assessed/performed Overall Cognitive Status: Within Functional Limits for tasks assessed                                 General Comments: HOH.  Extremely pleasant but very deconditioned. Agreeable to therapeutic interventions.                   Pertinent Vitals/ Pain       Pain Assessment: No/denies pain         Frequency  Min 2X/week        Progress Toward Goals  OT Goals(current goals can now be found in the care plan section)  Progress towards OT goals: Progressing toward goals  Acute Rehab OT Goals Patient Stated Goal: to get his strength back OT Goal Formulation: With patient Time For Goal Achievement: 02/08/21 Potential to Achieve Goals: [place] Discharge plan remains appropriate;Frequency remains appropriate       AM-PAC OT "6 Clicks" Daily Activity     Outcome Measure   Help from another person eating meals?: None Help from another person taking care of personal grooming?: A Little Help from another person toileting, which includes using toliet, bedpan, or urinal?: A Lot Help from another person bathing (including washing, rinsing, drying)?: A Lot Help from another person to put on and taking off regular upper body clothing?: A Little Help from another person to put on and taking off regular lower body clothing?: A Lot 6 Click Score: 16    End of Session    OT Visit Diagnosis: Other abnormalities of gait and mobility (R26.89);Muscle weakness (generalized) (M62.81);Unsteadiness  on feet (R26.81)   Activity Tolerance Patient limited by fatigue   Patient Left in bed;with call bell/phone within reach   Nurse Communication          Time: 1431-1455 OT Time Calculation (min): 24 min  Charges: OT General Charges $OT Visit: 1 Visit OT Treatments $Therapeutic Exercise: 23-37 mins  Darleen Crocker, MS, OTR/L , CBIS ascom [phone]  02/04/21, 3:45 PM

## 2021-02-04 NOTE — Care Management Important Message (Signed)
Important Message  Patient Details  Name: Devin Washington MRN: [id number] Date of Birth: October 25, 1923   Medicare Important Message Given:  Yes     Dannette Barbara 02/04/2021, 11:34 AM

## 2021-02-05 ENCOUNTER — Ambulatory Visit (INDEPENDENT_AMBULATORY_CARE_PROVIDER_SITE_OTHER): Payer: Medicare HMO | Admitting: Vascular Surgery

## 2021-02-05 ENCOUNTER — Encounter (INDEPENDENT_AMBULATORY_CARE_PROVIDER_SITE_OTHER): Payer: Medicare HMO

## 2021-02-05 LAB — CBC
HCT: 24.5 % — ABNORMAL LOW (ref 39.0–52.0)
Hemoglobin: 7.9 g/dL — ABNORMAL LOW (ref 13.0–17.0)
MCH: 29.3 pg (ref 26.0–34.0)
MCHC: 32.2 g/dL (ref 30.0–36.0)
MCV: 90.7 fL (ref 80.0–100.0)
Platelets: 329 10*3/uL (ref 150–400)
RBC: 2.7 MIL/uL — ABNORMAL LOW (ref 4.22–5.81)
RDW: 18.6 % — ABNORMAL HIGH (ref 11.5–15.5)
WBC: 12.6 10*3/uL — ABNORMAL HIGH (ref 4.0–10.5)
nRBC: 0 % (ref 0.0–0.2)

## 2021-02-05 LAB — BASIC METABOLIC PANEL
Anion gap: 4 — ABNORMAL LOW (ref 5–15)
BUN: 63 mg/dL — ABNORMAL HIGH (ref 8–23)
CO2: 26 mmol/L (ref 22–32)
Calcium: 8.7 mg/dL — ABNORMAL LOW (ref 8.9–10.3)
Chloride: 110 mmol/L (ref 98–111)
Creatinine, Ser: 1.96 mg/dL — ABNORMAL HIGH (ref 0.61–1.24)
GFR, Estimated: 31 mL/min — ABNORMAL LOW (ref >60)
Glucose, Bld: 98 mg/dL (ref 70–99)
Potassium: 4.5 mmol/L (ref 3.5–5.1)
Sodium: 140 mmol/L (ref 135–145)

## 2021-02-05 MED ORDER — ASPIRIN 81 MG PO TBEC: 81.0000 mg | DELAYED_RELEASE_TABLET | Freq: Every day | ORAL | 11 refills | Status: AC

## 2021-02-05 MED ORDER — METOPROLOL TARTRATE 50 MG PO TABS: 50.0000 mg | ORAL_TABLET | Freq: Two times a day (BID) | ORAL | 1 refills | Status: DC

## 2021-02-05 NOTE — Progress Notes (Signed)
Neva Seat to be D/C'd Skilled nursing facility ([facility]) per MD order.  Discussed prescriptions and follow up appointments with the patient. Report given to Benay Pillow at [facility], belongings sent with pt. [facility] to transport pt via stretcher.  Allergies as of 02/05/2021   No Known Allergies     Medication List    STOP taking these medications   amLODipine 10 MG tablet Commonly known as: NORVASC     TAKE these medications   Anoro Ellipta 62.5-25 MCG/INH Aepb Generic drug: umeclidinium-vilanterol Inhale 1 puff into the lungs 2 (two) times daily as needed.   aspirin 81 MG EC tablet Take 1 tablet (81 mg total) by mouth daily. Swallow whole. Start taking on: February 06, 2021 What changed:   how much to take  when to take this  additional instructions   feeding supplement Liqd Take 1 Bottle by mouth daily.   ferrous sulfate 325 (65 FE) MG tablet Take 1 tablet (325 mg total) by mouth 3 (three) times daily with meals. What changed: when to take this   furosemide 20 MG tablet Commonly known as: LASIX Take 1 tablet by mouth daily.   metoprolol tartrate 50 MG tablet Commonly known as: LOPRESSOR Take 1 tablet (50 mg total) by mouth 2 (two) times daily. What changed:   medication strength  how much to take   pantoprazole 40 MG tablet Commonly known as: PROTONIX Take 1 tablet (40 mg total) by mouth 2 (two) times daily before a meal.   simvastatin 40 MG tablet Commonly known as: ZOCOR Take 40 mg by mouth daily.   traMADol 50 MG tablet Commonly known as: ULTRAM Take 1 tablet (50 mg total) by mouth every 6 (six) hours as needed for moderate pain or severe pain.   traZODone 50 MG tablet Commonly known as: DESYREL Take 1 tablet by mouth at bedtime as needed for sleep.       Vitals:   02/05/21 0906 02/05/21 1127  BP: (!) 143/59 140/69  Pulse: 75 66  Resp:  17  Temp: 98.2 F (36.8 C) 97.8 F (36.6 C)  SpO2: 100% 100%    Rolley Sims

## 2021-02-05 NOTE — Discharge Instructions (Signed)
Advised to follow-up with primary care physician in 1 week. Advised to follow-up with GI Dr. Haig Prophet in 4 weeks.

## 2021-02-05 NOTE — TOC Transition Note (Signed)
Transition of Care [facility]) - CM/SW Discharge Note   Patient Details  Name: Devin Washington MRN: [id number] Date of Birth: 1923-02-15  [facility] CM/SW Contact:  Eileen Stanford, LCSW Phone Number: 02/05/2021, 12:21 PM   Clinical Narrative:   Clinical Social Worker facilitated patient discharge including contacting patient family and facility to confirm patient discharge plans.  Clinical information faxed to facility and family agreeable with plan.  CSW arranged ambulance transport via [facility] (1:00 PM) to [facility].  RN to call [phone] for report prior to discharge.     Final next level of care: Skilled Nursing Facility Barriers to Discharge: No Barriers Identified   Patient Goals and CMS Choice Patient states their goals for this hospitalization and ongoing recovery are:: To return back home CMS Medicare.gov Compare Post Acute Care list provided to:: Patient Choice offered to / list presented to : Patient  Discharge Placement              Patient chooses bed at:  ([facility]) Patient to be transferred to facility by: [facility] Name of family member notified: granddaughter Patient and family notified of of transfer: 02/05/21  Discharge Plan and Services In-house Referral: Clinical Social Work   Post Acute Care Choice: Home Health                    HH Arranged: PT [facility] Agency: Other - See comment ([facility]) Date [place]: 01/27/21 Time HH Agency Contacted: 1000 Representative spoke with at [place]: teresa cooper  Social Determinants of Health ([place]) Interventions     Readmission Risk Interventions Readmission Risk Prevention Plan 01/27/2021  Transportation Screening Complete  PCP or Specialist Appt within 5-7 Days Complete  Home Care Screening Complete  Medication Review (RN CM) Complete  Some recent data might be hidden

## 2021-02-05 NOTE — Discharge Summary (Signed)
Physician Discharge Summary  Devin Washington IHK:[id number] DOB: 09-02-1923 DOA: 01/21/2021  PCP: Albina Billet, MD  Admit date: 01/21/2021 .  Discharge date: 02/05/2021  Admitted From:  Home.  Disposition:  SNF.  Recommendations for Outpatient Follow-up:  1. Follow up with PCP in 1-2 weeks 2. Please obtain BMP/CBC in one week 3. Advised to follow-up with GI Dr. Haig Prophet in 3 to 4 weeks.  Home Health: None. Equipment/Devices: None.  Discharge Condition: Stable CODE STATUS:DNR Diet recommendation: Heart Healthy   Brief Summary/ Hospital Course: This 87 years old male with PMH of HTN, SSS with PPM, PVD, peptic ulcer disease,  erosive esophagitis and iron deficiency anemia presents in the emergency department with acute confusion and dark stools.  He presented to the infusion center for blood transfusion and was found to be hypothermic, hypoxic and hypotensive.  Patient was found to have a lactic acid of 11 with a hemoglobin of 6.0 in the ER.  He was admitted for severe sepsis secondary to enterocolitis,  He has completed antibiotics course,  enterocolitis has resolved. Sepsis has resolved. GI was consulted, Patient underwent EGD,  no active bleeding found. Patient has received 2 units of blood transfusion so far.  Hemoglobin remained stable.  Patient appears very weak, PT recommended SNF, Patient is accepted and being discharged to SNF. He will follow up PCP in one week.  He was managed for below problems.  Discharge Diagnoses:  Principal Problem:   Severe sepsis ([place]) Active Problems:   CKD (chronic kidney disease), stage III (HCC)   Hypertension   Symptomatic anemia   CHF (congestive heart failure) ([place])   Peripheral artery disease (HCC)   Anemia in chronic kidney disease  Sepsis due to enterocolitis, present on admission; > Resolved. Patient presented with tachycardia, tachypnea, hypothermia with lactic acid of 11. Antibiotics completed, enterocolitis resolved. Sepsis physiology  resolved.   Acute blood loss anemia on chronic iron deficiency anemia, anemia of chronic kidney disease: Transfused 2 units on admission. EPO repeated as scheduled 2/17. Hemoglobin remains stable in mid sevens since then, no further clinical bleeding. -Trend hemoglobin in 3 days -Continue iron supplement. - EGD: No active bleeding , GI recommended outpatient follow up. - PPI daily   Paroxysmal atrial fibrillation Not on anticoagulants. Heart rate normal. -Continue metoprolol.   AKI on CKD stage IIIb. > stable Resolved.  Peripheral vascular disease -Held aspirin given persistent anemia. Resumed aspirin.  Peripheral edema: Continue furosemide.  Hypertension Continue metoprolol. Hold home amlodipine.  Chronic obstructive pulmonary disease - Asymptomatic -Continue Anoro -Continue PRN albuterol   Discharge Instructions  Discharge Instructions    Call MD for:  difficulty breathing, headache or visual disturbances   Complete by: As directed    Call MD for:  extreme fatigue   Complete by: As directed    Call MD for:  persistant dizziness or light-headedness   Complete by: As directed    Call MD for:  persistant dizziness or light-headedness   Complete by: As directed    Call MD for:  persistant nausea and vomiting   Complete by: As directed    Diet - low sodium heart healthy   Complete by: As directed    Diet Carb Modified   Complete by: As directed    Diet general   Complete by: As directed    Discharge instructions   Complete by: As directed    Advised to follow-up with primary care physician in 1 week. Advised to follow-up with gastroenterology in 3  to 4 weeks.   Increase activity slowly   Complete by: As directed    Increase activity slowly   Complete by: As directed      Allergies as of 02/05/2021   No Known Allergies     Medication List    STOP taking these medications   amLODipine 10 MG tablet Commonly known as: NORVASC     TAKE  these medications   Anoro Ellipta 62.5-25 MCG/INH Aepb Generic drug: umeclidinium-vilanterol Inhale 1 puff into the lungs 2 (two) times daily as needed.   aspirin 81 MG EC tablet Take 1 tablet (81 mg total) by mouth daily. Swallow whole. Start taking on: February 06, 2021 What changed:   how much to take  when to take this  additional instructions   feeding supplement Liqd Take 1 Bottle by mouth daily.   ferrous sulfate 325 (65 FE) MG tablet Take 1 tablet (325 mg total) by mouth 3 (three) times daily with meals. What changed: when to take this   furosemide 20 MG tablet Commonly known as: LASIX Take 1 tablet by mouth daily.   metoprolol tartrate 50 MG tablet Commonly known as: LOPRESSOR Take 1 tablet (50 mg total) by mouth 2 (two) times daily. What changed:   medication strength  how much to take   pantoprazole 40 MG tablet Commonly known as: PROTONIX Take 1 tablet (40 mg total) by mouth 2 (two) times daily before a meal.   simvastatin 40 MG tablet Commonly known as: ZOCOR Take 40 mg by mouth daily.   traMADol 50 MG tablet Commonly known as: ULTRAM Take 1 tablet (50 mg total) by mouth every 6 (six) hours as needed for moderate pain or severe pain.   traZODone 50 MG tablet Commonly known as: DESYREL Take 1 tablet by mouth at bedtime as needed for sleep.       Follow-up Information    Albina Billet, MD Follow up in 1 week(s).   Specialty: Internal Medicine Contact information: [address] [place] [address] [phone]        Lesly Rubenstein, MD Follow up in 4 week(s).   Specialty: Gastroenterology Contact information: [place] [address] [phone]              No Known Allergies  Consultations:  Gastroenterology   Procedures/Studies: CT ABDOMEN PELVIS WO CONTRAST  Result Date: 01/21/2021 CLINICAL DATA:  Nonlocalized abdominal pain EXAM: CT ABDOMEN AND PELVIS WITHOUT CONTRAST TECHNIQUE:  Multidetector CT imaging of the abdomen and pelvis was performed following the standard protocol without IV contrast. COMPARISON:  CT pelvis 10/03/2018, CT angio abdomen and pelvis 07/14/2016, chest radiograph 01/21/2021 FINDINGS: Lower chest: Subpleural reticular changes are bronchitic features in the lung bases which are similar to comparison exam and may reflect some underlying interstitial lung disease. No focal airspace disease or pleural effusion. Few scattered tiny sub 2 mm micro nodules are present in both lung bases. Cardiac size is top normal. Calcification of the mitral annulus and chordae tendinae a as well as the aortic leaflets. Hypoattenuation of the cardiac blood pool compatible with anemia. Minimal pericardial thickening towards the apex, nonspecific though could correlate for history of prior infarct or pericardial infection/inflammation. Terminus of pacer/defibrillator leads noted at the right atrium and cardiac apex. Hepatobiliary: No visible focal liver lesion within limitations of this unenhanced exam. Smooth liver surface contour. Prominent fold in the otherwise unremarkable gallbladder. No visible calcified gallstones or biliary ductal dilatation. Pancreas:  No pancreatic ductal dilatation or surrounding inflammatory changes. Spleen: Normal in size. No concerning splenic lesions. Adrenals/Urinary Tract: Lobular thickening of the adrenal glands without concerning dominant nodule is similar to comparison studies. Mild bilateral renal atrophy. Multiple cortical cysts are present in both kidneys. Slightly more indeterminate hyperdense 7 mm partially exophytic cystic lesion is seen arising from the lower pole left kidney (2/27). No urolithiasis or hydronephrosis. Urinary bladder is largely decompressed at the time of exam and therefore poorly evaluated by CT imaging. No gross abnormality is clearly evident. Stomach/Bowel: Small sliding-type hiatal hernia. Stomach distended with air and ingested  material. Duodenum with a normal sweep across the midline abdomen. Small fluid-filled duodenal diverticulum noted near the second portion of the duodenum (2/24) without adjacent inflammation. Much of the small bowel and colon are diffusely fluid-filled. Some this slight hazy stranding is noted around several of the bowel loops most prominently the terminal ileum and distal rectosigmoid. No evidence of bowel obstruction. Appendix is not visualized. No focal inflammation the vicinity of the cecum to suggest an occult appendicitis. Vascular/Lymphatic: Atherosclerotic calcifications within the abdominal aorta and branch vessels. No aneurysm or ectasia. Redemonstration of an infrarenal abdominal aortic aneurysm now measuring up to 3 cm in size, minimally increased from prior. A right common iliac artery aneurysm measuring up to 2.8 cm in size is similar to prior. No enlarged abdominopelvic lymph nodes. Reproductive: Surgical changes in the deep pelvis likely reflect prior prostatectomy. Likely trace bilateral hydrocele. No other acute abnormality included external genitalia. Other: Diffuse circumferential body wall edema. Mild edematous changes in the central mesentery as well. No abdominopelvic free air or fluid. No bowel containing hernias. Musculoskeletal: The osseous structures appear diffusely demineralized which may limit detection of small or nondisplaced fractures. Multilevel degenerative changes are present in the imaged portions of the spine. Interspinous arthrosis suggest Baastrup's disease noted as well. Fairly significant disc bulging and protrusions with ligamentum flavum infolding result in some moderate to severe canal stenosis and foraminal narrowing L3-S1. IMPRESSION: 1. Much of the small bowel and colon are diffusely fluid-filled. Findings are nonspecific, but can be seen with an enterocolitis of infectious or inflammatory etiology. No evidence of bowel obstruction. 2. Minimal interval increase in size  of an infrarenal abdominal aortic aneurysm now measuring up to 3 cm in size. Stable right common iliac artery aneurysm measuring up to 2.8 cm in size. Recommend follow-up ultrasound every 3 years. This recommendation follows ACR consensus guidelines: White Paper of the ACR Incidental Findings Committee II on Vascular Findings. J Am Coll Radiol 2013; 10:789-794. 3. Indeterminate hyperdense 7 mm partially exophytic cystic lesion arising from the lower pole left kidney. Recommend further evaluation with nonemergent renal ultrasound. 4. Subpleural reticular changes in the lung bases are similar to comparison exam and may reflect some underlying interstitial lung disease. Additional scattered basilar micro nodules. No follow-up needed if patient is low-risk (and has no known or suspected primary neoplasm). Non-contrast chest CT can be considered in 12 months if patient is high-risk. This recommendation follows the consensus statement: Guidelines for Management of Incidental Pulmonary Nodules Detected on CT Images: From the Fleischner Society 2017; Radiology 2017; 284:228-243. 5. Hypoattenuation of the cardiac blood pool compatible with anemia. 6. Minimal pericardial thickening towards the apex, nonspecific though could correlate for history of prior infarct or pericardial infection/inflammation. 7. Multilevel degenerative changes of the imaged spine with moderate to severe canal stenosis and foraminal narrowing L3-S1. 8. Aortic Atherosclerosis (ICD10-I70.0). Electronically Signed   By: Lovena Le  M.D.   On: 01/21/2021 17:12   CT HEAD WO CONTRAST  Result Date: 01/30/2021 CLINICAL DATA:  Altered mental status presentation. EXAM: CT HEAD WITHOUT CONTRAST TECHNIQUE: Contiguous axial images were obtained from the base of the skull through the vertex without intravenous contrast. COMPARISON:  01/21/2021 FINDINGS: Brain: No change. Age related atrophy. Chronic small-vessel ischemic changes of the cerebral hemispheric  white matter. No sign of acute or subacute infarction, mass lesion, hemorrhage, hydrocephalus or extra-axial collection. Vascular: There is atherosclerotic calcification of the major vessels at the base of the brain. Skull: Negative Sinuses/Orbits: Clear/normal Other: None IMPRESSION: No acute finding by CT. Age related atrophy and chronic small-vessel ischemic changes of the white matter. Electronically Signed   By: Nelson Chimes M.D.   On: 01/30/2021 15:49   CT Head Wo Contrast  Result Date: 01/21/2021 CLINICAL DATA:  Fall, altered mental status, dual anti-platelet therapy EXAM: CT HEAD WITHOUT CONTRAST TECHNIQUE: Contiguous axial images were obtained from the base of the skull through the vertex without intravenous contrast. COMPARISON:  04/01/2018 FINDINGS: Brain: Normal anatomic configuration. Parenchymal volume loss is commensurate with the patient's age. Mild periventricular white matter changes are present likely reflecting the sequela of small vessel ischemia. No abnormal intra or extra-axial mass lesion or fluid collection. No abnormal mass effect or midline shift. No evidence of acute intracranial hemorrhage or infarct. Ventricular size is mildly enlarged, commensurate with the degree of cerebral atrophy. Cerebellum unremarkable. Vascular: No asymmetric hyperdense vasculature at the skull base. Extensive intracranial atherosclerotic calcification is seen within the internal carotid arteries. Skull: Intact Sinuses/Orbits: Paranasal sinuses are clear. Orbits are unremarkable. Other: Mastoid air cells and middle ear cavities are clear. IMPRESSION: No acute intracranial abnormality.  No calvarial fracture. Mild to moderate senescent change. Electronically Signed   By: Fidela Salisbury MD   On: 01/21/2021 11:41   US Venous Img Lower Unilateral Left (DVT)  Result Date: 01/21/2021 CLINICAL DATA:  Left lower extremity pain and edema. History left popliteal artery aneurysm and prior embolization distal native  left SFA and prior left femoral to peroneal artery bypass. EXAM: LEFT LOWER EXTREMITY VENOUS DOPPLER ULTRASOUND TECHNIQUE: Gray-scale sonography with graded compression, as well as color Doppler and duplex ultrasound were performed to evaluate the lower extremity deep venous systems from the level of the common femoral vein and including the common femoral, femoral, profunda femoral, popliteal and calf veins including the posterior tibial, peroneal and gastrocnemius veins when visible. The superficial great saphenous vein was also interrogated. Spectral Doppler was utilized to evaluate flow at rest and with distal augmentation maneuvers in the common femoral, femoral and popliteal veins. COMPARISON:  None. FINDINGS: Contralateral Common Femoral Vein: Respiratory phasicity is normal and symmetric with the symptomatic side. No evidence of thrombus. Normal compressibility. Common Femoral Vein: No evidence of thrombus. Normal compressibility, respiratory phasicity and response to augmentation. Saphenofemoral Junction: No evidence of thrombus. Normal compressibility and flow on color Doppler imaging. Profunda Femoral Vein: No evidence of thrombus. Normal compressibility and flow on color Doppler imaging. Femoral Vein: No evidence of thrombus. Normal compressibility, respiratory phasicity and response to augmentation. Popliteal Vein: The popliteal vein was not adequately visualized due to overlying hypoechoic structure that does not contain blood flow. This presumably relates to thrombus in a thrombosed popliteal artery aneurysm. Mass effect of this thrombosed aneurysm on the popliteal vein cannot be excluded. Calf Veins: The posterior tibial vein is visualized and patent. The peroneal vein is not visualized. Superficial Great Saphenous Vein: No evidence of thrombus.  Normal compressibility. Venous Reflux:  None. Other Findings:  No evidence of superficial thrombophlebitis. IMPRESSION: 1. No visualized left lower extremity  deep vein thrombosis. 2. 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 Mass effect of this thrombosed aneurysm on the popliteal vein cannot be excluded. Electronically Signed   By: Aletta Edouard M.D.   On: 01/21/2021 16:51   DG Chest Port 1 View  Result Date: 01/22/2021 CLINICAL DATA:  Hypoxia EXAM: PORTABLE CHEST 1 VIEW COMPARISON:  01/21/2021 FINDINGS: Cardiac shadow is stable. Pacing device is again seen. Aortic calcifications are noted. Lungs are well aerated bilaterally. No focal infiltrate or effusion is seen. No acute bony abnormality is noted. IMPRESSION: No active disease. Electronically Signed   By: Inez Catalina M.D.   On: 01/22/2021 16:18   DG Chest Port 1 View  Result Date: 01/21/2021 CLINICAL DATA:  Altered mental status. EXAM: PORTABLE CHEST 1 VIEW COMPARISON:  04/03/2018 FINDINGS: Stable heart size and positioning of biventricular pacemaker. There is no evidence of pulmonary edema, consolidation, pneumothorax, nodule or pleural fluid. Visualized bony structures are unremarkable. IMPRESSION: No active disease. Electronically Signed   By: Aletta Edouard M.D.   On: 01/21/2021 11:07    EGD    Subjective: Patient was seen and examined at bedside.  Overnight events noted.  Patient reports feeling much better,  Patient has participated in physical therapy,  recommended skilled nursing facility.  Patient is being discharged to skilled nursing facility today.  Discharge Exam: Vitals:   02/05/21 0423 02/05/21 0906  BP: 136/62 (!) 143/59  Pulse: 67 75  Resp: 16   Temp: 97.7 F (36.5 C) 98.2 F (36.8 C)  SpO2: 100% 100%   Vitals:   02/04/21 1959 02/04/21 2332 02/05/21 0423 02/05/21 0906  BP: (!) 124/53 (!) 111/59 136/62 (!) 143/59  Pulse: 74 69 67 75  Resp: 18 18 16    Temp: 98.2 F (36.8 C) 98.6 F (37 C) 97.7 F (36.5 C) 98.2 F (36.8 C)  TempSrc: Oral Oral Oral Oral  SpO2:  100% 100% 100% 100%  Weight:      Height:        General: Pt is alert, awake, not in acute distress Cardiovascular: RRR, S1/S2 +, no rubs, no gallops Respiratory: CTA bilaterally, no wheezing, no rhonchi Abdominal: Soft, NT, ND, bowel sounds + Extremities: no edema, no cyanosis    The results of significant diagnostics from this hospitalization (including imaging, microbiology, ancillary and laboratory) are listed below for reference.     Microbiology: Recent Results (from the past 240 hour(s))  SARS CORONAVIRUS 2 (TAT 6-24 HRS) Nasopharyngeal Nasopharyngeal Swab     Status: None   Collection Time: 01/31/21  2:23 PM   Specimen: Nasopharyngeal Swab  Result Value Ref Range Status   SARS Coronavirus 2 NEGATIVE NEGATIVE Final    Comment: (NOTE) SARS-CoV-2 target nucleic acids are NOT DETECTED.  The SARS-CoV-2 RNA is generally detectable in upper and lower respiratory specimens during the acute phase of infection. Negative results do not preclude SARS-CoV-2 infection, do not rule out co-infections with other pathogens, and should not be used as the sole basis for treatment or other patient management decisions. Negative results must be combined with clinical observations, patient history, and epidemiological information. The expected result is Negative.  Fact Sheet for Patients: SugarRoll.be  Fact Sheet for Healthcare Providers: https://www.woods-mathews.com/  This test is not yet approved or cleared by the [place] FDA  and  has been authorized for detection and/or diagnosis of SARS-CoV-2 by FDA under an Emergency Use Authorization (EUA). This EUA will remain  in effect (meaning this test can be used) for the duration of the COVID-19 declaration under Se ction 564(b)(1) of the Act, 21 U.S.C. section 360bbb-3(b)(1), unless the authorization is terminated or revoked sooner.  Performed at [facility], [place] [address],  [place], [place] [address]   Resp Panel by RT-PCR (Flu A&B, Covid) Nasopharyngeal Swab     Status: None   Collection Time: 02/04/21 11:49 AM   Specimen: Nasopharyngeal Swab; Nasopharyngeal(NP) swabs in vial transport medium  Result Value Ref Range Status   SARS Coronavirus 2 by RT PCR NEGATIVE NEGATIVE Final    Comment: (NOTE) SARS-CoV-2 target nucleic acids are NOT DETECTED.  The SARS-CoV-2 RNA is generally detectable in upper respiratory specimens during the acute phase of infection. The lowest concentration of SARS-CoV-2 viral copies this assay can detect is 138 copies/mL. A negative result does not preclude SARS-Cov-2 infection and should not be used as the sole basis for treatment or other patient management decisions. A negative result may occur with  improper specimen collection/handling, submission of specimen other than nasopharyngeal swab, presence of viral mutation(s) within the areas targeted by this assay, and inadequate number of viral copies(<138 copies/mL). A negative result must be combined with clinical observations, patient history, and epidemiological information. The expected result is Negative.  Fact Sheet for Patients:  EntrepreneurPulse.com.au  Fact Sheet for Healthcare Providers:  IncredibleEmployment.be  This test is no t yet approved or cleared by the [place] FDA and  has been authorized for detection and/or diagnosis of SARS-CoV-2 by FDA under an Emergency Use Authorization (EUA). This EUA will remain  in effect (meaning this test can be used) for the duration of the COVID-19 declaration under Section 564(b)(1) of the Act, 21 U.S.C.section 360bbb-3(b)(1), unless the authorization is terminated  or revoked sooner.       Influenza A by PCR NEGATIVE NEGATIVE Final   Influenza B by PCR NEGATIVE NEGATIVE Final    Comment: (NOTE) The Xpert Xpress SARS-CoV-2/FLU/RSV plus assay is intended as an aid in the diagnosis of  influenza from Nasopharyngeal swab specimens and should not be used as a sole basis for treatment. Nasal washings and aspirates are unacceptable for Xpert Xpress SARS-CoV-2/FLU/RSV testing.  Fact Sheet for Patients: EntrepreneurPulse.com.au  Fact Sheet for Healthcare Providers: IncredibleEmployment.be  This test is not yet approved or cleared by the [place] FDA and has been authorized for detection and/or diagnosis of SARS-CoV-2 by FDA under an Emergency Use Authorization (EUA). This EUA will remain in effect (meaning this test can be used) for the duration of the COVID-19 declaration under Section 564(b)(1) of the Act, 21 U.S.C. section 360bbb-3(b)(1), unless the authorization is terminated or revoked.  Performed at [facility], [address] [place], [place] [address]      Labs: BNP (last 3 results) No results for input(s): BNP in the last 8760 hours. Basic Metabolic Panel: Recent Labs  Lab 02/04/21 0904 02/05/21 0533  NA 138 140  K 4.1 4.5  CL 107 110  CO2 23 26  GLUCOSE 103* 98  BUN 64* 63*  CREATININE 2.07* 1.96*  CALCIUM 8.9 8.7*  MG 2.1  --   PHOS 3.1  --    Liver Function Tests: Recent Labs  Lab 02/04/21 0904  AST 23  ALT 16  ALKPHOS 56  BILITOT 0.6  PROT 5.5*  ALBUMIN  2.3*   No results for input(s): LIPASE, AMYLASE in the last 168 hours. No results for input(s): AMMONIA in the last 168 hours. CBC: Recent Labs  Lab 01/30/21 0521 02/02/21 0642 02/04/21 0904 02/05/21 0533  WBC 12.7* 13.8* 12.8* 12.6*  HGB 7.7* 7.2* 7.9* 7.9*  HCT 23.7* 22.8* 25.5* 24.5*  MCV 90.8 91.6 91.7 90.7  PLT 246 273 364 329   Cardiac Enzymes: No results for input(s): CKTOTAL, CKMB, CKMBINDEX, TROPONINI in the last 168 hours. BNP: Invalid input(s): POCBNP CBG: No results for input(s): GLUCAP in the last 168 hours. D-Dimer No results for input(s): DDIMER in the last 72 hours. Hgb A1c No results for input(s):  HGBA1C in the last 72 hours. Lipid Profile No results for input(s): CHOL, HDL, LDLCALC, TRIG, CHOLHDL, LDLDIRECT in the last 72 hours. Thyroid function studies No results for input(s): TSH, T4TOTAL, T3FREE, THYROIDAB in the last 72 hours.  Invalid input(s): FREET3 Anemia work up No results for input(s): VITAMINB12, FOLATE, FERRITIN, TIBC, IRON, RETICCTPCT in the last 72 hours. Urinalysis    Component Value Date/Time   COLORURINE YELLOW (A) 01/21/2021 1055   APPEARANCEUR HAZY (A) 01/21/2021 1055   LABSPEC 1.016 01/21/2021 1055   PHURINE 5.0 01/21/2021 1055   GLUCOSEU 50 (A) 01/21/2021 1055   HGBUR LARGE (A) 01/21/2021 1055   BILIRUBINUR NEGATIVE 01/21/2021 1055   KETONESUR NEGATIVE 01/21/2021 1055   PROTEINUR 100 (A) 01/21/2021 1055   NITRITE NEGATIVE 01/21/2021 1055   LEUKOCYTESUR NEGATIVE 01/21/2021 1055   Sepsis Labs Invalid input(s): PROCALCITONIN,  WBC,  LACTICIDVEN Microbiology Recent Results (from the past 240 hour(s))  SARS CORONAVIRUS 2 (TAT 6-24 HRS) Nasopharyngeal Nasopharyngeal Swab     Status: None   Collection Time: 01/31/21  2:23 PM   Specimen: Nasopharyngeal Swab  Result Value Ref Range Status   SARS Coronavirus 2 NEGATIVE NEGATIVE Final    Comment: (NOTE) SARS-CoV-2 target nucleic acids are NOT DETECTED.  The SARS-CoV-2 RNA is generally detectable in upper and lower respiratory specimens during the acute phase of infection. Negative results do not preclude SARS-CoV-2 infection, do not rule out co-infections with other pathogens, and should not be used as the sole basis for treatment or other patient management decisions. Negative results must be combined with clinical observations, patient history, and epidemiological information. The expected result is Negative.  Fact Sheet for Patients: SugarRoll.be  Fact Sheet for Healthcare Providers: https://www.woods-mathews.com/  This test is not yet approved or cleared  by the [place] FDA and  has been authorized for detection and/or diagnosis of SARS-CoV-2 by FDA under an Emergency Use Authorization (EUA). This EUA will remain  in effect (meaning this test can be used) for the duration of the COVID-19 declaration under Se ction 564(b)(1) of the Act, 21 U.S.C. section 360bbb-3(b)(1), unless the authorization is terminated or revoked sooner.  Performed at [facility], [place] [address], [place], [place] [address]   Resp Panel by RT-PCR (Flu A&B, Covid) Nasopharyngeal Swab     Status: None   Collection Time: 02/04/21 11:49 AM   Specimen: Nasopharyngeal Swab; Nasopharyngeal(NP) swabs in vial transport medium  Result Value Ref Range Status   SARS Coronavirus 2 by RT PCR NEGATIVE NEGATIVE Final    Comment: (NOTE) SARS-CoV-2 target nucleic acids are NOT DETECTED.  The SARS-CoV-2 RNA is generally detectable in upper respiratory specimens during the acute phase of infection. The lowest concentration of SARS-CoV-2 viral copies this assay can detect is 138 copies/mL. A negative result does not preclude SARS-Cov-2  infection and should not be used as the sole basis for treatment or other patient management decisions. A negative result may occur with  improper specimen collection/handling, submission of specimen other than nasopharyngeal swab, presence of viral mutation(s) within the areas targeted by this assay, and inadequate number of viral copies(<138 copies/mL). A negative result must be combined with clinical observations, patient history, and epidemiological information. The expected result is Negative.  Fact Sheet for Patients:  EntrepreneurPulse.com.au  Fact Sheet for Healthcare Providers:  IncredibleEmployment.be  This test is no t yet approved or cleared by the [place] FDA and  has been authorized for detection and/or diagnosis of SARS-CoV-2 by FDA under an Emergency Use Authorization (EUA).  This EUA will remain  in effect (meaning this test can be used) for the duration of the COVID-19 declaration under Section 564(b)(1) of the Act, 21 U.S.C.section 360bbb-3(b)(1), unless the authorization is terminated  or revoked sooner.       Influenza A by PCR NEGATIVE NEGATIVE Final   Influenza B by PCR NEGATIVE NEGATIVE Final    Comment: (NOTE) The Xpert Xpress SARS-CoV-2/FLU/RSV plus assay is intended as an aid in the diagnosis of influenza from Nasopharyngeal swab specimens and should not be used as a sole basis for treatment. Nasal washings and aspirates are unacceptable for Xpert Xpress SARS-CoV-2/FLU/RSV testing.  Fact Sheet for Patients: EntrepreneurPulse.com.au  Fact Sheet for Healthcare Providers: IncredibleEmployment.be  This test is not yet approved or cleared by the [place] FDA and has been authorized for detection and/or diagnosis of SARS-CoV-2 by FDA under an Emergency Use Authorization (EUA). This EUA will remain in effect (meaning this test can be used) for the duration of the COVID-19 declaration under Section 564(b)(1) of the Act, 21 U.S.C. section 360bbb-3(b)(1), unless the authorization is terminated or revoked.  Performed at [facility], [address], [place], [place] [address]      Time coordinating discharge: Over 30 minutes  SIGNED:   Shawna Clamp, MD  [facility] 02/05/2021, 10:49 AM Pager   If 7PM-7AM, please contact night-coverage [email or web address]

## 2021-02-11 ENCOUNTER — Other Ambulatory Visit: Payer: Self-pay

## 2021-02-11 ENCOUNTER — Emergency Department
Admission: EM | Admit: 2021-02-11 | Discharge: 2021-02-11 | Disposition: A | Discharge status: Home / Self Care | Payer: Medicare HMO | Attending: Emergency Medicine | Admitting: Emergency Medicine

## 2021-02-11 DIAGNOSIS — I509 Heart failure, unspecified: Secondary | ICD-10-CM | POA: Insufficient documentation

## 2021-02-11 DIAGNOSIS — R040 Epistaxis: Secondary | ICD-10-CM | POA: Insufficient documentation

## 2021-02-11 DIAGNOSIS — Z8546 Personal history of malignant neoplasm of prostate: Secondary | ICD-10-CM | POA: Insufficient documentation

## 2021-02-11 DIAGNOSIS — Z950 Presence of cardiac pacemaker: Secondary | ICD-10-CM | POA: Insufficient documentation

## 2021-02-11 DIAGNOSIS — Z87891 Personal history of nicotine dependence: Secondary | ICD-10-CM | POA: Diagnosis not present

## 2021-02-11 DIAGNOSIS — J45909 Unspecified asthma, uncomplicated: Secondary | ICD-10-CM | POA: Insufficient documentation

## 2021-02-11 DIAGNOSIS — Z79899 Other long term (current) drug therapy: Secondary | ICD-10-CM | POA: Diagnosis not present

## 2021-02-11 DIAGNOSIS — N183 Chronic kidney disease, stage 3 unspecified: Secondary | ICD-10-CM | POA: Diagnosis not present

## 2021-02-11 DIAGNOSIS — Z7982 Long term (current) use of aspirin: Secondary | ICD-10-CM | POA: Insufficient documentation

## 2021-02-11 DIAGNOSIS — Z96652 Presence of left artificial knee joint: Secondary | ICD-10-CM | POA: Insufficient documentation

## 2021-02-11 DIAGNOSIS — I130 Hypertensive heart and chronic kidney disease with heart failure and stage 1 through stage 4 chronic kidney disease, or unspecified chronic kidney disease: Secondary | ICD-10-CM | POA: Insufficient documentation

## 2021-02-11 MED ORDER — OXYMETAZOLINE HCL 0.05 % NA SOLN
3.0000 | Freq: Once | NASAL | Status: AC
  Administered 2021-02-11: 3 via NASAL
  Filled 2021-02-11: qty 30

## 2021-02-11 MED ORDER — ACETAMINOPHEN 500 MG PO TABS: 1000.0000 mg | ORAL_TABLET | Freq: Once | ORAL | Status: DC

## 2021-02-11 NOTE — ED Notes (Signed)
Pt d/c to [facility] with ambulance. NAD noted,

## 2021-02-11 NOTE — Discharge Instructions (Signed)
You may use nasal saline to keep your mucous membranes moist. You may use a humidifier.  Other than nasal saline, please do not put anything into your nose for the next 3-4 days. Please do not blow your nose for the next 3-4 days. If your nose begins to bleed again, at this time it is okay to blow your nose and blow out all of the clots and then spray Afrin nasal spray into both nostrils and hold direct pressure for 30 minutes without stopping. If this does not stop the bleeding, please return to the hospital.

## 2021-02-11 NOTE — ED Notes (Signed)
Pt provided cup of water

## 2021-02-11 NOTE — ED Triage Notes (Signed)
Per ems facility reports pt R nare "bleeding on and off all night" with passage of clots. Pt confirms this report. Denies fall or injury and states onset while pt was sleeping. Pt denies taking daily thinners. No active bleeding noted at this time though scant amount dry blood noted in R nare. Pt alert and oriented following commands appropriately. Breathing unlabored and speaking in full sentences. Pt denies pain.

## 2021-02-11 NOTE — ED Notes (Signed)
Pt ambulated with RN and walker around room. No bleeding noted from R nare.

## 2021-02-11 NOTE — ED Provider Notes (Addendum)
[facility] Emergency Department Provider Note  ____________________________________________   Event Date/Time   First MD Initiated Contact with Patient 02/11/21 [phone]     (approximate)  I have reviewed the triage vital signs and the nursing notes.   HISTORY  Chief Complaint Epistaxis    HPI Devin Washington is a 87 y.o. male with history of atrial fibrillation not on anticoagulation, hypertension, hyperlipidemia, CHF and sick sinus syndrome status post pacemaker who presents to the emergency department with nosebleed that woke him from sleep.  States he woke up and there was blood on his pillow.  Nosebleed has stopped spontaneously.  He denies any recent head injury, facial injury, nose or sinus surgeries, putting anything into his nose.  No fever.  No chest pain or shortness of breath.   It appears he is on aspirin 81 mg daily.  Patient arrives with EMS.  Patient lives in a nursing facility.  Normally ambulates with a walker.        Past Medical History:  Diagnosis Date  . A-fib ([place])   . Anemia   . Asthma   . Cardiomyopathy ([place])   . CHF (congestive heart failure) ([place])   . DJD (degenerative joint disease)   . Duodenal ulcer 08/09/2015  . Dysrhythmia   . Erosive esophagitis   . HH (hiatus hernia)   . HLD (hyperlipidemia)   . Hypertension   . Pacemaker   . PAD (peripheral artery disease) ([place])   . Peripheral vascular disease ([place])   . Presence of permanent cardiac pacemaker   . Prostate cancer [facility])    Prostatectomy.  . 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 Shortness of breath   . SSS (sick sinus syndrome) [facility])     Patient Active Problem List   Diagnosis Date Noted  . Severe sepsis ([place]) 01/21/2021  . Swelling of limb 07/03/2020  . Peripheral artery disease ([place]) 01/09/2020  . Benign hypertensive kidney disease with chronic kidney disease 12/21/2019  . Proteinuria 12/21/2019  . Secondary hyperparathyroidism of renal origin ([place]) 12/21/2019  .  Anemia in chronic kidney disease 12/21/2019  . Atrial fibrillation ([place]) 08/24/2019  . Bradycardia 08/24/2019  . Cardiomyopathy ([place]) 08/24/2019  . CHF (congestive heart failure) ([place]) 08/24/2019  . Sick sinus syndrome ([place]) 08/24/2019  . SOB (shortness of breath) 08/24/2019  . Age-related osteoporosis with current pathological fracture with routine healing 10/08/2018  . Wheelchair bound 10/08/2018  . Pubic ramus fracture ([place]) 10/03/2018  . Acute blood loss anemia 05/28/2018  . Iron deficiency anemia 04/09/2018  . Symptomatic anemia 04/02/2018  . 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 Hypertension 11/13/2017  . 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 Protein-calorie malnutrition, severe ([place]) 08/08/2015  . Abdominal pain, epigastric 08/06/2015  . Nausea and vomiting 08/06/2015  . HTN (hypertension) 08/06/2015  . Hypercholesteremia 08/06/2015  . Back pain 08/06/2015  . CKD (chronic kidney disease), stage III ([place]) 08/06/2015  . Abdominal pain 08/06/2015    Past Surgical History:  Procedure Laterality Date  . CATARACT EXTRACTION, BILATERAL    . COLONOSCOPY N/A 04/04/2018   Procedure: COLONOSCOPY;  Surgeon: Lin Landsman, MD;  Location[facility] ENDOSCOPY;  Service: Gastroenterology;  Laterality: N/A;  . ESOPHAGOGASTRODUODENOSCOPY N/A 08/09/2015   Procedure: ESOPHAGOGASTRODUODENOSCOPY (EGD) looking in the stomach with a lighted tube to evaluate and treat;  Surgeon: Lollie Sails, MD;  Location: [facility] ENDOSCOPY;  Service: Endoscopy;  Laterality: N/A;  Procedure to be done tomorrow afternoon, 08/08/2015. Awaiting cardiology consult  .  ESOPHAGOGASTRODUODENOSCOPY N/A 04/04/2018   Procedure: ESOPHAGOGASTRODUODENOSCOPY (EGD);  Surgeon: Lin Landsman, MD;  Location: [facility] ENDOSCOPY;  Service: Gastroenterology;  Laterality: N/A;  . ESOPHAGOGASTRODUODENOSCOPY (EGD) WITH PROPOFOL N/A 01/25/2021   Procedure: ESOPHAGOGASTRODUODENOSCOPY (EGD) WITH PROPOFOL;  Surgeon:  Lesly Rubenstein, MD;  Location: [facility] ENDOSCOPY;  Service: Gastroenterology;  Laterality: N/A;  . EYE SURGERY    . FEMORAL-POPLITEAL BYPASS GRAFT Left 12/16/2017   Procedure: BYPASS GRAFT FEMORAL-POPLITEAL ARTERY ( OPEN POPLITEAL BYPASS );  Surgeon: Algernon Huxley, MD;  Location: [facility] ORS;  Service: Vascular;  Laterality: Left;  . FLEXIBLE SIGMOIDOSCOPY N/A 08/09/2015   Procedure: FLEXIBLE SIGMOIDOSCOPY looking up the rectum into the distal colon with a lighted tube to examine and treat;  Surgeon: Lollie Sails, MD;  Location: [facility] ENDOSCOPY;  Service: Endoscopy;  Laterality: N/A;  Procedure to be done tomorrow afternoon, 08/08/2015. Awaiting cardiology consult.  . INSERT / REPLACE / REMOVE PACEMAKER    . JOINT REPLACEMENT Left    left knee replacement  . LOWER EXTREMITY ANGIOGRAPHY Left 11/16/2017   Procedure: LOWER EXTREMITY ANGIOGRAPHY;  Surgeon: Algernon Huxley, MD;  Location: [place] CV LAB;  Service: Cardiovascular;  Laterality: Left;  . LOWER EXTREMITY ANGIOGRAPHY Right 11/30/2017   Procedure: LOWER EXTREMITY ANGIOGRAPHY;  Surgeon: Algernon Huxley, MD;  Location: [place] CV LAB;  Service: Cardiovascular;  Laterality: Right;  . LOWER EXTREMITY ANGIOGRAPHY Left 03/04/2018   Procedure: LOWER EXTREMITY ANGIOGRAPHY;  Surgeon: Algernon Huxley, MD;  Location: [place] CV LAB;  Service: Cardiovascular;  Laterality: Left;  . LOWER EXTREMITY ANGIOGRAPHY Left 04/12/2018   Procedure: LOWER EXTREMITY ANGIOGRAPHY;  Surgeon: Algernon Huxley, MD;  Location: [place] CV LAB;  Service: Cardiovascular;  Laterality: Left;  . PACEMAKER INSERTION    . PROSTATE SURGERY    . PROSTATE SURGERY      Prior to Admission medications   Medication Sig Start Date End Date Taking? Authorizing Provider  ANORO ELLIPTA 62.5-25 MCG/INH AEPB Inhale 1 puff into the lungs 2 (two) times daily as needed. 04/13/17   [provider]  aspirin EC 81 MG EC tablet Take 1 tablet (81 mg total) by mouth daily. Swallow  whole. 02/06/21   Shawna Clamp, MD  feeding supplement, ENSURE ENLIVE, (ENSURE ENLIVE) LIQD Take 1 Bottle by mouth daily. 04/07/18   [provider]  ferrous sulfate 325 (65 FE) MG tablet Take 1 tablet (325 mg total) by mouth 3 (three) times daily with meals. 01/27/21   Little Ishikawa, MD  furosemide (LASIX) 20 MG tablet Take 1 tablet by mouth daily. 04/23/17   [provider]  metoprolol tartrate (LOPRESSOR) 50 MG tablet Take 1 tablet (50 mg total) by mouth 2 (two) times daily. 02/05/21   Shawna Clamp, MD  pantoprazole (PROTONIX) 40 MG tablet Take 1 tablet (40 mg total) by mouth 2 (two) times daily before a meal. 01/28/21   Little Ishikawa, MD  simvastatin (ZOCOR) 40 MG tablet Take 40 mg by mouth daily. 07/12/20   [provider]  traMADol (ULTRAM) 50 MG tablet Take 1 tablet (50 mg total) by mouth every 6 (six) hours as needed for moderate pain or severe pain. 10/07/18   Nicholes Mango, MD  traZODone (DESYREL) 50 MG tablet Take 1 tablet by mouth at bedtime as needed for sleep.  04/13/17   [provider]    Allergies Patient has no known allergies.  Family History  Problem Relation Age of Onset  . Lung cancer Father  Social History Social History   Tobacco Use  . Smoking status: Former Smoker    Quit date: 03/02/1958    Years since quitting: 62.9  . Smokeless tobacco: Former Systems developer    Types: Chew, Snuff  Vaping Use  . Vaping Use: Never used  Substance Use Topics  . Alcohol use: No    Alcohol/week: 0.0 standard drinks  . Drug use: No    Review of Systems Constitutional: No fever. Eyes: No visual changes. ENT: No sore throat. Cardiovascular: Denies chest pain. Respiratory: Denies shortness of breath. Gastrointestinal: No nausea, vomiting, diarrhea. Genitourinary: Negative for dysuria. Musculoskeletal: Negative for back pain. Skin: Negative for rash. Neurological: Negative for focal weakness or  numbness.  ____________________________________________   PHYSICAL EXAM:  VITAL SIGNS: ED Triage Vitals  Enc Vitals Group     BP 02/11/21 0419 (!) 170/74     Pulse Rate 02/11/21 0417 67     Resp 02/11/21 0417 18     Temp 02/11/21 0417 98.4 F (36.9 C)     Temp Source 02/11/21 0417 Oral     SpO2 02/11/21 0417 100 %     Weight 02/11/21 0417 171 lb (77.6 kg)     Height 02/11/21 0417 5\' 10"  (1.778 m)     Head Circumference --      Peak Flow --      Pain Score 02/11/21 0417 0     Pain Loc --      Pain Edu? --      Excl. in [place]? --    CONSTITUTIONAL: Alert and oriented and responds appropriately to questions. Well-appearing; well-nourished HEAD: Normocephalic EYES: Conjunctivae clear, pupils appear equal, EOM appear intact ENT: normal nose; moist mucous membranes; small amount of blood noted in the right nostril without signs of clot and no active bleeding, no blood in the posterior oropharynx, normal phonation, no stridor or trismus or drooling NECK: Supple, normal ROM CARD: RRR; S1 and S2 appreciated; no murmurs, no clicks, no rubs, no gallops RESP: Normal chest excursion without splinting or tachypnea; breath sounds clear and equal bilaterally; no wheezes, no rhonchi, no rales, no hypoxia or respiratory distress, speaking full sentences ABD/GI: Normal bowel sounds; non-distended; soft, non-tender, no rebound, no guarding, no peritoneal signs, no hepatosplenomegaly BACK: The back appears normal EXT: Normal ROM in all joints; no deformity noted, no edema; no cyanosis SKIN: Normal color for age and race; warm; no rash on exposed skin NEURO: Moves all extremities equally PSYCH: The patient's mood and manner are appropriate.  ____________________________________________   LABS (all labs ordered are listed, but only abnormal results are displayed)  Labs Reviewed - No data to  display ____________________________________________  EKG  None ____________________________________________  RADIOLOGY I, Kristen Ward, personally viewed and evaluated these images (plain radiographs) as part of my medical decision making, as well as reviewing the written report by the radiologist.  ED MD interpretation: None  Official radiology report(s): No results found.  ____________________________________________   PROCEDURES  Procedure(s) performed (including Critical Care):  Procedures   ____________________________________________   INITIAL IMPRESSION / ASSESSMENT AND PLAN / ED COURSE  As part of my medical decision making, I reviewed the following data within the [place] notes reviewed and incorporated, Old chart reviewed and Notes from prior ED visits         Patient here with spontaneous nosebleed that has also spontaneously resolved.  Will spray Afrin in his nose and continue to observe.  Will ambulate to ensure no recurrence  of bleeding.  Nothing at this time that appears to need cautery.  He does not need packing currently.  He is on aspirin but no anticoagulation.  Otherwise well-appearing without other complaints.  ED PROGRESS  5:35 AM  Pt able to ambulate in his room without any recurrent bleeding.  I feel he is safe to be discharged home.  Discussed bleeding return precautions and instructions on what to do at home if he had recurrence of a nosebleed.  Will discharge with Afrin.  At this time, I do not feel there is any life-threatening condition present. I have reviewed, interpreted and discussed all results (EKG, imaging, lab, urine as appropriate) and exam findings with patient/family. I have reviewed nursing notes and appropriate previous records.  I feel the patient is safe to be discharged home without further emergent workup and can continue workup as an outpatient as needed. Discussed usual and customary return  precautions. Patient/family verbalize understanding and are comfortable with this plan.  Outpatient follow-up has been provided as needed. All questions have been answered.   6:05 AM  Pt resting comfortably.  He does not have any recurrent nosebleed. ____________________________________________   FINAL CLINICAL IMPRESSION(S) / ED DIAGNOSES  Final diagnoses:  Right-sided epistaxis     ED Discharge Orders    None      *Please note:  Devin Washington was evaluated in Emergency Department on 02/11/2021 for the symptoms described in the history of present illness. He was evaluated in the context of the global COVID-19 pandemic, which necessitated consideration that the patient might be at risk for infection with the SARS-CoV-2 virus that causes COVID-19. Institutional protocols and algorithms that pertain to the evaluation of patients at risk for COVID-19 are in a state of rapid change based on information released by regulatory bodies including the CDC and federal and state organizations. These policies and algorithms were followed during the patient's care in the ED.  Some ED evaluations and interventions may be delayed as a result of limited staffing during and the pandemic.*   Note:  This document was prepared using Dragon voice recognition software and may include unintentional dictation errors.       Ward, Delice Bison, DO 02/11/21 [id number]

## 2021-02-21 ENCOUNTER — Inpatient Hospital Stay: Payer: Medicare HMO

## 2021-02-21 ENCOUNTER — Inpatient Hospital Stay: Payer: Medicare HMO | Attending: Oncology

## 2021-02-21 VITALS — BP 170/67 | HR 64

## 2021-02-21 DIAGNOSIS — D631 Anemia in chronic kidney disease: Secondary | ICD-10-CM | POA: Insufficient documentation

## 2021-02-21 DIAGNOSIS — N189 Chronic kidney disease, unspecified: Secondary | ICD-10-CM | POA: Insufficient documentation

## 2021-02-21 DIAGNOSIS — D509 Iron deficiency anemia, unspecified: Secondary | ICD-10-CM

## 2021-02-21 LAB — HEMOGLOBIN AND HEMATOCRIT, BLOOD
HCT: 26.8 % — ABNORMAL LOW (ref 39.0–52.0)
Hemoglobin: 8.3 g/dL — ABNORMAL LOW (ref 13.0–17.0)

## 2021-02-21 MED ORDER — EPOETIN ALFA-EPBX 10000 UNIT/ML IJ SOLN
10000.0000 [IU] | Freq: Once | INTRAMUSCULAR | Status: AC
  Administered 2021-02-21: 10000 [IU] via SUBCUTANEOUS
  Filled 2021-02-21: qty 1

## 2021-02-21 MED ORDER — EPOETIN ALFA-EPBX 40000 UNIT/ML IJ SOLN
40000.0000 [IU] | Freq: Once | INTRAMUSCULAR | Status: AC
  Administered 2021-02-21: 40000 [IU] via SUBCUTANEOUS
  Filled 2021-02-21: qty 1

## 2021-02-21 MED ORDER — EPOETIN ALFA-EPBX 20000 UNIT/ML IJ SOLN: 50000.0000 [IU] | INTRAMUSCULAR | Status: DC

## 2021-03-14 ENCOUNTER — Other Ambulatory Visit: Payer: Self-pay

## 2021-03-14 ENCOUNTER — Inpatient Hospital Stay: Payer: Medicare HMO

## 2021-03-14 ENCOUNTER — Inpatient Hospital Stay (HOSPITAL_BASED_OUTPATIENT_CLINIC_OR_DEPARTMENT_OTHER): Payer: Medicare HMO | Admitting: Oncology

## 2021-03-14 ENCOUNTER — Encounter: Payer: Self-pay | Admitting: Oncology

## 2021-03-14 VITALS — BP 138/70 | HR 65 | Temp 98.7°F | Resp 18 | Wt 171.0 lb

## 2021-03-14 DIAGNOSIS — D631 Anemia in chronic kidney disease: Secondary | ICD-10-CM

## 2021-03-14 DIAGNOSIS — D509 Iron deficiency anemia, unspecified: Secondary | ICD-10-CM

## 2021-03-14 DIAGNOSIS — N189 Chronic kidney disease, unspecified: Secondary | ICD-10-CM | POA: Diagnosis not present

## 2021-03-14 LAB — IRON AND TIBC
Iron: 31 ug/dL — ABNORMAL LOW (ref 45–182)
Saturation Ratios: 16 % — ABNORMAL LOW (ref 17.9–39.5)
TIBC: 196 ug/dL — ABNORMAL LOW (ref 250–450)
UIBC: 165 ug/dL

## 2021-03-14 LAB — CBC
HCT: 30.5 % — ABNORMAL LOW (ref 39.0–52.0)
Hemoglobin: 9.6 g/dL — ABNORMAL LOW (ref 13.0–17.0)
MCH: 28.3 pg (ref 26.0–34.0)
MCHC: 31.5 g/dL (ref 30.0–36.0)
MCV: 90 fL (ref 80.0–100.0)
Platelets: 192 10*3/uL (ref 150–400)
RBC: 3.39 MIL/uL — ABNORMAL LOW (ref 4.22–5.81)
RDW: 17 % — ABNORMAL HIGH (ref 11.5–15.5)
WBC: 6.8 10*3/uL (ref 4.0–10.5)
nRBC: 0 % (ref 0.0–0.2)

## 2021-03-14 LAB — FERRITIN: Ferritin: 296 ng/mL (ref 24–336)

## 2021-03-14 MED ORDER — EPOETIN ALFA-EPBX 10000 UNIT/ML IJ SOLN
10000.0000 [IU] | Freq: Once | INTRAMUSCULAR | Status: AC
  Administered 2021-03-14: 10000 [IU] via SUBCUTANEOUS
  Filled 2021-03-14: qty 1

## 2021-03-14 MED ORDER — EPOETIN ALFA-EPBX 20000 UNIT/ML IJ SOLN: 50000.0000 [IU] | INTRAMUSCULAR | Status: DC

## 2021-03-14 MED ORDER — EPOETIN ALFA-EPBX 40000 UNIT/ML IJ SOLN
40000.0000 [IU] | Freq: Once | INTRAMUSCULAR | Status: AC
Start: 2021-03-14 — End: 2021-03-14
  Administered 2021-03-14: 40000 [IU] via SUBCUTANEOUS
  Filled 2021-03-14: qty 1

## 2021-03-14 NOTE — Progress Notes (Signed)
Hematology/Oncology Consult note [facility]  Telephone:([phone] [phone]  Patient Care Team: Albina Billet, MD as PCP - General (Internal Medicine) Sindy Guadeloupe, MD as Consulting Physician (Hematology and Oncology)   Name of the patient: Linwood Gullikson  [id number]  October 29, 1923   Date of visit: 03/14/21  Diagnosis- anemia likely multifactorial secondary to iron deficiency and chronic kidney disease  Chief complaint/ Reason for visit- routine f/u of anemia  Heme/Onc history: patient is a 87 year old male with a past medical history significant for atrial fibrillation, CHF, hypertension and prostate cancer who presented to the ER after he suffered a fall. On admission he was found to have H&H of 7.1/22.2 with an MCV of 80.1 and a platelet count of 268.BMP showed an elevated creatinine of 1.9. TSH was normal at 0.52.Patient received 1 unit of PRBC in the ER. Ferritin checked per day later was 152. Iron studies revealed low serum iron of 27, low TIBC of 229 and low iron saturation of 12%. B12 levels were low normal at 264. Folate level was normal at 17.5. Reticulocyte count was low at 2% for the degree of anemia.Coombs test was negative and haptoglobin was normal. LDH was mildly elevated at 228. Myeloma panel showed M protein of IgG kappa 0.5 g. Both kappa and lambda free light chains were elevated with a normal free light chain ratio 0.77. E Po level was 22.3  Patient underwent EGD and colonoscopy on 04/04/2018. EGD showed nonobstructing nonbleeding superficial duodenal ulcers with a clean ulcer base. There are nonbleeding erosions in the gastric antrum andno stigmata of recent bleeding.Duodenum were suspicious for H. pylori.Colonoscopy showed hemorrhoids and 7 mm cecalpolyp  Interval history- Patient admitted to hospital last month for enterocolitis requiring antibiotics. He feels better today. Denies any blood loss in stool or  urine  ECOG PS- 2 Pain scale- 0  Review of systems- Review of Systems  Constitutional: Positive for malaise/fatigue. Negative for chills, fever and weight loss.  HENT: Negative for congestion, ear discharge and nosebleeds.   Eyes: Negative for blurred vision.  Respiratory: Negative for cough, hemoptysis, sputum production, shortness of breath and wheezing.   Cardiovascular: Negative for chest pain, palpitations, orthopnea and claudication.  Gastrointestinal: Negative for abdominal pain, blood in stool, constipation, diarrhea, heartburn, melena, nausea and vomiting.  Genitourinary: Negative for dysuria, flank pain, frequency, hematuria and urgency.  Musculoskeletal: Negative for back pain, joint pain and myalgias.  Skin: Negative for rash.  Neurological: Negative for dizziness, tingling, focal weakness, seizures, weakness and headaches.  Endo/Heme/Allergies: Does not bruise/bleed easily.  Psychiatric/Behavioral: Negative for depression and suicidal ideas. The patient does not have insomnia.     No Known Allergies   Past Medical History:  Diagnosis Date  . A-fib ([place])   . Anemia   . Asthma   . Cardiomyopathy ([place])   . CHF (congestive heart failure) ([address])   . DJD (degenerative joint disease)   . Duodenal ulcer 08/09/2015  . Dysrhythmia   . Erosive esophagitis   . HH (hiatus hernia)   . HLD (hyperlipidemia)   . Hypertension   . Pacemaker   . PAD (peripheral artery disease) ([place])   . Peripheral vascular disease ([place])   . Presence of permanent cardiac pacemaker   . Prostate cancer [facility])    Prostatectomy.  . 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 Shortness of breath   . SSS (sick sinus syndrome) [facility])      Past Surgical History:  Procedure Laterality Date  .  CATARACT EXTRACTION, BILATERAL    . COLONOSCOPY N/A 04/04/2018   Procedure: COLONOSCOPY;  Surgeon: Lin Landsman, MD;  Location: [facility] ENDOSCOPY;  Service: Gastroenterology;  Laterality: N/A;  . ESOPHAGOGASTRODUODENOSCOPY  N/A 08/09/2015   Procedure: ESOPHAGOGASTRODUODENOSCOPY (EGD) looking in the stomach with a lighted tube to evaluate and treat;  Surgeon: Lollie Sails, MD;  Location: [facility] ENDOSCOPY;  Service: Endoscopy;  Laterality: N/A;  Procedure to be done tomorrow afternoon, 08/08/2015. Awaiting cardiology consult  . ESOPHAGOGASTRODUODENOSCOPY N/A 04/04/2018   Procedure: ESOPHAGOGASTRODUODENOSCOPY (EGD);  Surgeon: Lin Landsman, MD;  Location: [facility] ENDOSCOPY;  Service: Gastroenterology;  Laterality: N/A;  . ESOPHAGOGASTRODUODENOSCOPY (EGD) WITH PROPOFOL N/A 01/25/2021   Procedure: ESOPHAGOGASTRODUODENOSCOPY (EGD) WITH PROPOFOL;  Surgeon: Lesly Rubenstein, MD;  Location: [facility] ENDOSCOPY;  Service: Gastroenterology;  Laterality: N/A;  . EYE SURGERY    . FEMORAL-POPLITEAL BYPASS GRAFT Left 12/16/2017   Procedure: BYPASS GRAFT FEMORAL-POPLITEAL ARTERY ( OPEN POPLITEAL BYPASS );  Surgeon: Algernon Huxley, MD;  Location: [facility] ORS;  Service: Vascular;  Laterality: Left;  . FLEXIBLE SIGMOIDOSCOPY N/A 08/09/2015   Procedure: FLEXIBLE SIGMOIDOSCOPY looking up the rectum into the distal colon with a lighted tube to examine and treat;  Surgeon: Lollie Sails, MD;  Location: [facility] ENDOSCOPY;  Service: Endoscopy;  Laterality: N/A;  Procedure to be done tomorrow afternoon, 08/08/2015. Awaiting cardiology consult.  . INSERT / REPLACE / REMOVE PACEMAKER    . JOINT REPLACEMENT Left    left knee replacement  . LOWER EXTREMITY ANGIOGRAPHY Left 11/16/2017   Procedure: LOWER EXTREMITY ANGIOGRAPHY;  Surgeon: Algernon Huxley, MD;  Location: [place] CV LAB;  Service: Cardiovascular;  Laterality: Left;  . LOWER EXTREMITY ANGIOGRAPHY Right 11/30/2017   Procedure: LOWER EXTREMITY ANGIOGRAPHY;  Surgeon: Algernon Huxley, MD;  Location: [place] CV LAB;  Service: Cardiovascular;  Laterality: Right;  . LOWER EXTREMITY ANGIOGRAPHY Left 03/04/2018   Procedure: LOWER EXTREMITY ANGIOGRAPHY;  Surgeon: Algernon Huxley, MD;  Location: [place] CV LAB;  Service: Cardiovascular;  Laterality: Left;  . LOWER EXTREMITY ANGIOGRAPHY Left 04/12/2018   Procedure: LOWER EXTREMITY ANGIOGRAPHY;  Surgeon: Algernon Huxley, MD;  Location: [place] CV LAB;  Service: Cardiovascular;  Laterality: Left;  . PACEMAKER INSERTION    . PROSTATE SURGERY    . PROSTATE SURGERY      Social History   Socioeconomic History  . Marital status: Widowed    Spouse name: Not on file  . Number of children: Not on file  . Years of education: Not on file  . Highest education level: Not on file  Occupational History    Employer: RETIRED/[employer]  Tobacco Use  . Smoking status: Former Smoker    Quit date: 03/02/1958    Years since quitting: 63.0  . Smokeless tobacco: Former Systems developer    Types: Chew, Snuff  Vaping Use  . Vaping Use: Never used  Substance and Sexual Activity  . Alcohol use: No    Alcohol/week: 0.0 standard drinks  . Drug use: No  . Sexual activity: Not Currently  Other Topics Concern  . Not on file  Social History Narrative  . Not on file   Social Determinants of Health   Financial Resource Strain: Not on file  Food Insecurity: Not on file  Transportation Needs: Not on file  Physical Activity: Not on file  Stress: Not on file  Social Connections: Not on file  Intimate Partner Violence: Not on file    Family History  Problem Relation  Age of Onset  . Lung cancer Father      Current Outpatient Medications:  .  aspirin EC 81 MG EC tablet, Take 1 tablet (81 mg total) by mouth daily. Swallow whole., Disp: 30 tablet, Rfl: 11 .  feeding supplement, ENSURE ENLIVE, (ENSURE ENLIVE) LIQD, Take 1 Bottle by mouth daily., Disp: , Rfl:  .  ferrous sulfate 325 (65 FE) MG tablet, Take 1 tablet (325 mg total) by mouth 3 (three) times daily with meals., Disp: 90 tablet, Rfl: 1 .  furosemide (LASIX) 20 MG tablet, Take 1 tablet by mouth daily., Disp: , Rfl:  .  metoprolol tartrate (LOPRESSOR) 50 MG tablet, Take 1 tablet (50 mg  total) by mouth 2 (two) times daily., Disp: 60 tablet, Rfl: 1 .  simvastatin (ZOCOR) 40 MG tablet, Take 40 mg by mouth daily., Disp: , Rfl:  .  traZODone (DESYREL) 50 MG tablet, Take 1 tablet by mouth at bedtime as needed for sleep. , Disp: , Rfl:  .  ANORO ELLIPTA 62.5-25 MCG/INH AEPB, Inhale 1 puff into the lungs 2 (two) times daily as needed., Disp: , Rfl:  .  pantoprazole (PROTONIX) 40 MG tablet, Take 1 tablet (40 mg total) by mouth 2 (two) times daily before a meal. (Patient not taking: Reported on 03/14/2021), Disp: 60 tablet, Rfl: 1 .  traMADol (ULTRAM) 50 MG tablet, Take 1 tablet (50 mg total) by mouth every 6 (six) hours as needed for moderate pain or severe pain. (Patient not taking: Reported on 03/14/2021), Disp: 30 tablet, Rfl: 0  Physical exam:  Vitals:   03/14/21 0930  BP: 138/70  Pulse: 65  Resp: 18  Temp: 98.7 F (37.1 C)  TempSrc: Tympanic  SpO2: 100%  Weight: 171 lb (77.6 kg)   Physical Exam Constitutional:      Comments: Elderly gentleman sitting in a wheelchair. Appears in no acute distress  Cardiovascular:     Rate and Rhythm: Normal rate and regular rhythm.     Heart sounds: Normal heart sounds.  Pulmonary:     Effort: Pulmonary effort is normal.     Breath sounds: Normal breath sounds.  Skin:    General: Skin is warm and dry.  Neurological:     Mental Status: He is alert and oriented to person, place, and time.      CMP Latest Ref Rng & Units 02/05/2021  Glucose 70 - 99 mg/dL 98  BUN 8 - 23 mg/dL 63(H)  Creatinine 0.61 - 1.24 mg/dL 1.96(H)  Sodium 135 - 145 mmol/L 140  Potassium 3.5 - 5.1 mmol/L 4.5  Chloride 98 - 111 mmol/L 110  CO2 22 - 32 mmol/L 26  Calcium 8.9 - 10.3 mg/dL 8.7(L)  Total Protein 6.5 - 8.1 g/dL -  Total Bilirubin 0.3 - 1.2 mg/dL -  Alkaline Phos 38 - 126 U/L -  AST 15 - 41 U/L -  ALT 0 - 44 U/L -   CBC Latest Ref Rng & Units 03/14/2021  WBC 4.0 - 10.5 K/uL 6.8  Hemoglobin 13.0 - 17.0 g/dL 9.6(L)  Hematocrit 39.0 - 52.0 %  30.5(L)  Platelets 150 - 400 K/uL 192     Assessment and plan- Patient is a 87 y.o. male with anemia of chronic kidney disease and iron deficiency anemia here for routine f/u  Hemoglobin improved to 9.6 today since hospitalization. Iron studies indicative of anemia of chronic disease. He will receive retacrit today and every 3 weeks. I will see him in 3 months with labs  Visit Diagnosis 1. Iron deficiency anemia, unspecified iron deficiency anemia type   2. Anemia of chronic renal failure, unspecified CKD stage      Dr. Randa Evens, MD, MPH [facility] at [facility] [phone] 03/14/2021 12:43 PM

## 2021-03-19 ENCOUNTER — Ambulatory Visit: Payer: Medicare HMO

## 2021-03-19 ENCOUNTER — Ambulatory Visit: Payer: Medicare HMO | Admitting: Oncology

## 2021-03-19 ENCOUNTER — Other Ambulatory Visit: Payer: Medicare HMO

## 2021-04-04 ENCOUNTER — Inpatient Hospital Stay: Payer: Medicare HMO | Attending: Oncology

## 2021-04-04 ENCOUNTER — Inpatient Hospital Stay: Payer: Medicare HMO

## 2021-04-04 VITALS — BP 165/82 | HR 65

## 2021-04-04 DIAGNOSIS — D509 Iron deficiency anemia, unspecified: Secondary | ICD-10-CM

## 2021-04-04 DIAGNOSIS — D631 Anemia in chronic kidney disease: Secondary | ICD-10-CM | POA: Insufficient documentation

## 2021-04-04 DIAGNOSIS — N189 Chronic kidney disease, unspecified: Secondary | ICD-10-CM | POA: Diagnosis present

## 2021-04-04 LAB — HEMOGLOBIN AND HEMATOCRIT, BLOOD
HCT: 33 % — ABNORMAL LOW (ref 39.0–52.0)
Hemoglobin: 10.6 g/dL — ABNORMAL LOW (ref 13.0–17.0)

## 2021-04-04 MED ORDER — EPOETIN ALFA-EPBX 10000 UNIT/ML IJ SOLN
10000.0000 [IU] | Freq: Once | INTRAMUSCULAR | Status: AC
  Administered 2021-04-04: 10000 [IU] via SUBCUTANEOUS
  Filled 2021-04-04: qty 1

## 2021-04-04 MED ORDER — EPOETIN ALFA-EPBX 20000 UNIT/ML IJ SOLN: 50000.0000 [IU] | INTRAMUSCULAR | Status: DC

## 2021-04-04 MED ORDER — EPOETIN ALFA-EPBX 40000 UNIT/ML IJ SOLN
40000.0000 [IU] | Freq: Once | INTRAMUSCULAR | Status: AC
  Administered 2021-04-04: 40000 [IU] via SUBCUTANEOUS
  Filled 2021-04-04: qty 1

## 2021-04-08 ENCOUNTER — Other Ambulatory Visit
Admission: RE | Admit: 2021-04-08 | Discharge: 2021-04-08 | Disposition: A | Discharge status: Home / Self Care | Payer: Medicare HMO | Source: Ambulatory Visit | Attending: Cardiology | Admitting: Cardiology

## 2021-04-08 ENCOUNTER — Other Ambulatory Visit: Payer: Self-pay

## 2021-04-08 DIAGNOSIS — Z20822 Contact with and (suspected) exposure to covid-19: Secondary | ICD-10-CM | POA: Insufficient documentation

## 2021-04-08 DIAGNOSIS — Z01812 Encounter for preprocedural laboratory examination: Secondary | ICD-10-CM | POA: Diagnosis present

## 2021-04-09 ENCOUNTER — Other Ambulatory Visit: Payer: Medicare HMO

## 2021-04-09 LAB — SARS CORONAVIRUS 2 (TAT 6-24 HRS): SARS Coronavirus 2: NEGATIVE

## 2021-04-09 MED ORDER — CEFAZOLIN SODIUM-DEXTROSE 2-4 GM/100ML-% IV SOLN: 2.0000 g | INTRAVENOUS | Status: DC

## 2021-04-09 MED ORDER — SODIUM CHLORIDE 0.9 % IV SOLN
80.0000 mg | INTRAVENOUS | Status: DC
  Filled 2021-04-09: qty 2

## 2021-04-09 MED ORDER — SODIUM CHLORIDE 0.9 % IV SOLN: INTRAVENOUS | Status: DC

## 2021-04-10 ENCOUNTER — Other Ambulatory Visit: Payer: Self-pay

## 2021-04-10 ENCOUNTER — Encounter: Payer: Self-pay | Admitting: Certified Registered Nurse Anesthetist

## 2021-04-10 ENCOUNTER — Encounter
Admission: RE | Disposition: A | Discharge status: Home / Self Care | Payer: Self-pay | Source: Ambulatory Visit | Attending: Cardiology

## 2021-04-10 ENCOUNTER — Ambulatory Visit
Admission: RE | Admit: 2021-04-10 | Discharge: 2021-04-10 | Disposition: A | Discharge status: Home / Self Care | Payer: Medicare HMO | Source: Ambulatory Visit | Attending: Cardiology | Admitting: Cardiology

## 2021-04-10 ENCOUNTER — Encounter: Payer: Self-pay | Admitting: Cardiology

## 2021-04-10 DIAGNOSIS — Z45018 Encounter for adjustment and management of other part of cardiac pacemaker: Secondary | ICD-10-CM

## 2021-04-10 DIAGNOSIS — Z45010 Encounter for checking and testing of cardiac pacemaker pulse generator [battery]: Secondary | ICD-10-CM | POA: Insufficient documentation

## 2021-04-10 HISTORY — PX: PPM GENERATOR CHANGEOUT: EP1233

## 2021-04-10 SURGERY — PPM GENERATOR CHANGEOUT
Anesthesia: Moderate Sedation

## 2021-04-10 MED ORDER — SODIUM CHLORIDE 0.9 % IV SOLN: INTRAVENOUS | Status: DC

## 2021-04-10 MED ORDER — CHLORHEXIDINE GLUCONATE CLOTH 2 % EX PADS: 6.0000 | MEDICATED_PAD | Freq: Every day | CUTANEOUS | Status: DC

## 2021-04-10 MED ORDER — LIDOCAINE HCL (PF) 1 % IJ SOLN
INTRAMUSCULAR | Status: DC | PRN
  Administered 2021-04-10: 20 mL via SUBCUTANEOUS

## 2021-04-10 MED ORDER — FENTANYL CITRATE (PF) 100 MCG/2ML IJ SOLN
INTRAMUSCULAR | Status: AC
  Filled 2021-04-10: qty 2

## 2021-04-10 MED ORDER — SODIUM CHLORIDE 0.9 % IV SOLN
80.0000 mg | INTRAVENOUS | Status: AC
  Administered 2021-04-10: 80 mg
  Filled 2021-04-10: qty 2

## 2021-04-10 MED ORDER — CEPHALEXIN 250 MG PO CAPS: 500.0000 mg | ORAL_CAPSULE | Freq: Two times a day (BID) | ORAL | 0 refills | Status: DC

## 2021-04-10 MED ORDER — ONDANSETRON HCL 4 MG/2ML IJ SOLN: 4.0000 mg | Freq: Four times a day (QID) | INTRAMUSCULAR | Status: DC | PRN

## 2021-04-10 MED ORDER — MIDAZOLAM HCL 2 MG/2ML IJ SOLN
INTRAMUSCULAR | Status: DC | PRN
  Administered 2021-04-10: 1 mg via INTRAVENOUS

## 2021-04-10 MED ORDER — MIDAZOLAM HCL 2 MG/2ML IJ SOLN
INTRAMUSCULAR | Status: AC
  Filled 2021-04-10: qty 2

## 2021-04-10 MED ORDER — HEPARIN (PORCINE) IN NACL 1000-0.9 UT/500ML-% IV SOLN
INTRAVENOUS | Status: AC
  Filled 2021-04-10: qty 1000

## 2021-04-10 MED ORDER — CEFAZOLIN SODIUM-DEXTROSE 2-4 GM/100ML-% IV SOLN
2.0000 g | INTRAVENOUS | Status: AC
  Administered 2021-04-10: 2 g via INTRAVENOUS

## 2021-04-10 MED ORDER — ACETAMINOPHEN 325 MG PO TABS: 325.0000 mg | ORAL_TABLET | ORAL | Status: DC | PRN

## 2021-04-10 MED ORDER — SODIUM CHLORIDE 0.45 % IV SOLN: INTRAVENOUS | Status: DC

## 2021-04-10 MED ORDER — FENTANYL CITRATE (PF) 100 MCG/2ML IJ SOLN
INTRAMUSCULAR | Status: DC | PRN
  Administered 2021-04-10: 25 ug via INTRAVENOUS

## 2021-04-10 MED ORDER — LIDOCAINE HCL (PF) 1 % IJ SOLN
INTRAMUSCULAR | Status: AC
  Filled 2021-04-10: qty 30

## 2021-04-10 SURGICAL SUPPLY — 18 items
CABLE SURG 12 DISP A/V CHANNEL (MISCELLANEOUS) ×2 IMPLANT
COVER SURGICAL LIGHT HANDLE (MISCELLANEOUS) ×2 IMPLANT
DERMABOND ADVANCED (GAUZE/BANDAGES/DRESSINGS) ×2
DERMABOND ADVANCED .7 DNX12 (GAUZE/BANDAGES/DRESSINGS) ×2 IMPLANT
DEVICE DSSCT PLSMBLD 3.0S LGHT (MISCELLANEOUS) ×1 IMPLANT
DRAPE INCISE 23X17 IOBAN STRL (DRAPES) ×1
DRAPE INCISE IOBAN 23X17 STRL (DRAPES) ×1 IMPLANT
IPG PACE AZUR XT DR MRI W1DR01 (Pacemaker) ×1 IMPLANT
PACE AZURE XT DR MRI W1DR01 (Pacemaker) ×2 IMPLANT
PAD ELECT DEFIB RADIOL ZOLL (MISCELLANEOUS) ×2 IMPLANT
PLASMABLADE 3.0S W/LIGHT (MISCELLANEOUS) ×2
POUCH AIGIS-R ANTIBACT PPM (Mesh General) ×2 IMPLANT
SUT VIC AB 2-0 CT1 27 (SUTURE) ×1
SUT VIC AB 2-0 CT1 TAPERPNT 27 (SUTURE) ×1 IMPLANT
SUT VICRYL 4-0  27 PS-2 BARIAT (SUTURE) ×1
SUT VICRYL 4-0 27 PS-2 BARIAT (SUTURE) ×1
SUTURE VICRYL 4-0 27 PS-2 BART (SUTURE) ×1 IMPLANT
TRAY PACEMAKER INSERTION (PACKS) ×2 IMPLANT

## 2021-04-10 NOTE — Discharge Instructions (Signed)
Removed dressing 04/11/2021.  Patient may shower 04/11/2021.

## 2021-04-25 ENCOUNTER — Inpatient Hospital Stay: Payer: Medicare HMO | Attending: Oncology

## 2021-04-25 ENCOUNTER — Inpatient Hospital Stay: Payer: Medicare HMO

## 2021-04-25 VITALS — BP 164/71 | HR 60

## 2021-04-25 DIAGNOSIS — D631 Anemia in chronic kidney disease: Secondary | ICD-10-CM | POA: Diagnosis not present

## 2021-04-25 DIAGNOSIS — N189 Chronic kidney disease, unspecified: Secondary | ICD-10-CM | POA: Diagnosis present

## 2021-04-25 DIAGNOSIS — D509 Iron deficiency anemia, unspecified: Secondary | ICD-10-CM | POA: Diagnosis not present

## 2021-04-25 LAB — HEMOGLOBIN AND HEMATOCRIT, BLOOD
HCT: 33.9 % — ABNORMAL LOW (ref 39.0–52.0)
Hemoglobin: 10.7 g/dL — ABNORMAL LOW (ref 13.0–17.0)

## 2021-04-25 MED ORDER — EPOETIN ALFA-EPBX 10000 UNIT/ML IJ SOLN
10000.0000 [IU] | Freq: Once | INTRAMUSCULAR | Status: AC
  Administered 2021-04-25: 10000 [IU] via SUBCUTANEOUS
  Filled 2021-04-25: qty 1

## 2021-04-25 MED ORDER — EPOETIN ALFA-EPBX 20000 UNIT/ML IJ SOLN: 50000.0000 [IU] | INTRAMUSCULAR | Status: DC

## 2021-04-25 MED ORDER — EPOETIN ALFA-EPBX 40000 UNIT/ML IJ SOLN
40000.0000 [IU] | Freq: Once | INTRAMUSCULAR | Status: AC
  Administered 2021-04-25: 40000 [IU] via SUBCUTANEOUS
  Filled 2021-04-25: qty 1

## 2021-05-16 ENCOUNTER — Inpatient Hospital Stay: Payer: Medicare HMO

## 2021-05-20 ENCOUNTER — Inpatient Hospital Stay: Payer: Medicare HMO | Attending: Oncology

## 2021-05-24 ENCOUNTER — Telehealth: Payer: Self-pay | Admitting: Oncology

## 2021-05-24 NOTE — Telephone Encounter (Signed)
Spoke with patient's granddaughter to reschedule appt on 6/30. Appt moved to

## 2021-06-13 ENCOUNTER — Ambulatory Visit: Payer: Medicare HMO | Admitting: Oncology

## 2021-06-13 ENCOUNTER — Other Ambulatory Visit: Payer: Medicare HMO

## 2021-06-13 ENCOUNTER — Ambulatory Visit: Payer: Medicare HMO

## 2021-06-28 ENCOUNTER — Inpatient Hospital Stay: Payer: Medicare HMO | Admitting: Oncology

## 2021-06-28 ENCOUNTER — Inpatient Hospital Stay: Payer: Medicare HMO

## 2021-07-23 ENCOUNTER — Inpatient Hospital Stay: Payer: Medicare HMO

## 2021-07-23 ENCOUNTER — Inpatient Hospital Stay (HOSPITAL_BASED_OUTPATIENT_CLINIC_OR_DEPARTMENT_OTHER): Payer: Medicare HMO | Admitting: Oncology

## 2021-07-23 ENCOUNTER — Encounter: Payer: Self-pay | Admitting: Oncology

## 2021-07-23 ENCOUNTER — Inpatient Hospital Stay: Payer: Medicare HMO | Attending: Oncology

## 2021-07-23 VITALS — BP 154/76 | HR 59 | Temp 98.2°F | Resp 16 | Ht 70.0 in | Wt 158.0 lb

## 2021-07-23 DIAGNOSIS — D631 Anemia in chronic kidney disease: Secondary | ICD-10-CM

## 2021-07-23 DIAGNOSIS — D509 Iron deficiency anemia, unspecified: Secondary | ICD-10-CM

## 2021-07-23 DIAGNOSIS — D649 Anemia, unspecified: Secondary | ICD-10-CM | POA: Diagnosis not present

## 2021-07-23 DIAGNOSIS — Z79899 Other long term (current) drug therapy: Secondary | ICD-10-CM | POA: Insufficient documentation

## 2021-07-23 DIAGNOSIS — N183 Chronic kidney disease, stage 3 unspecified: Secondary | ICD-10-CM | POA: Diagnosis not present

## 2021-07-23 DIAGNOSIS — N189 Chronic kidney disease, unspecified: Secondary | ICD-10-CM

## 2021-07-23 LAB — CBC WITH DIFFERENTIAL/PLATELET
Abs Immature Granulocytes: 0.05 10*3/uL (ref 0.00–0.07)
Basophils Absolute: 0.1 10*3/uL (ref 0.0–0.1)
Basophils Relative: 1 %
Eosinophils Absolute: 0.2 10*3/uL (ref 0.0–0.5)
Eosinophils Relative: 2 %
HCT: 30.2 % — ABNORMAL LOW (ref 39.0–52.0)
Hemoglobin: 9.6 g/dL — ABNORMAL LOW (ref 13.0–17.0)
Immature Granulocytes: 1 %
Lymphocytes Relative: 11 %
Lymphs Abs: 1 10*3/uL (ref 0.7–4.0)
MCH: 27.6 pg (ref 26.0–34.0)
MCHC: 31.8 g/dL (ref 30.0–36.0)
MCV: 86.8 fL (ref 80.0–100.0)
Monocytes Absolute: 0.6 10*3/uL (ref 0.1–1.0)
Monocytes Relative: 7 %
Neutro Abs: 6.8 10*3/uL (ref 1.7–7.7)
Neutrophils Relative %: 78 %
Platelets: 225 10*3/uL (ref 150–400)
RBC: 3.48 MIL/uL — ABNORMAL LOW (ref 4.22–5.81)
RDW: 19.9 % — ABNORMAL HIGH (ref 11.5–15.5)
WBC: 8.6 10*3/uL (ref 4.0–10.5)
nRBC: 0 % (ref 0.0–0.2)

## 2021-07-23 LAB — IRON AND TIBC
Iron: 35 ug/dL — ABNORMAL LOW (ref 45–182)
Saturation Ratios: 17 % — ABNORMAL LOW (ref 17.9–39.5)
TIBC: 211 ug/dL — ABNORMAL LOW (ref 250–450)
UIBC: 176 ug/dL

## 2021-07-23 LAB — FOLATE: Folate: 23 ng/mL (ref >5.9)

## 2021-07-23 LAB — VITAMIN B12: Vitamin B-12: 1251 pg/mL — ABNORMAL HIGH (ref 180–914)

## 2021-07-23 MED ORDER — EPOETIN ALFA-EPBX 20000 UNIT/ML IJ SOLN: 50000.0000 [IU] | INTRAMUSCULAR | Status: DC

## 2021-07-23 MED ORDER — EPOETIN ALFA-EPBX 10000 UNIT/ML IJ SOLN
10000.0000 [IU] | Freq: Once | INTRAMUSCULAR | Status: AC
  Administered 2021-07-23: 10000 [IU] via SUBCUTANEOUS
  Filled 2021-07-23: qty 1

## 2021-07-23 MED ORDER — EPOETIN ALFA-EPBX 40000 UNIT/ML IJ SOLN
40000.0000 [IU] | Freq: Once | INTRAMUSCULAR | Status: AC
  Administered 2021-07-23: 40000 [IU] via SUBCUTANEOUS
  Filled 2021-07-23: qty 1

## 2021-07-23 NOTE — Progress Notes (Signed)
Hematology/Oncology Consult note [facility]  [phone] [phone]  Patient Care Team: Albina Billet, MD as PCP - General (Internal Medicine) Sindy Guadeloupe, MD as Consulting Physician (Hematology and Oncology)   Name of the patient: Devin Washington  [id number]  Jun 08, 1923   Date of visit: 07/23/21  Diagnosis- anemia likely multifactorial secondary to iron deficiency and chronic kidney disease    Chief complaint/ Reason for visit-routine follow-up of anemia of chronic kidney disease  Heme/Onc history: patient is a 87 year old male with a past medical history significant for atrial fibrillation, CHF, hypertension and prostate cancer who presented to the ER after he suffered a fall.  On admission he was found to have H&H of 7.1/22.2 with an MCV of 80.1 and a platelet count of 268.  BMP showed an elevated creatinine of 1.9.  TSH was normal at 0.52.  Patient received 1 unit of PRBC in the ER.  Ferritin checked per day later was 152.  Iron studies revealed low serum iron of 27, low TIBC of 229 and low iron saturation of 12%.  B12 levels were low normal at 264.  Folate level was normal at 17.5.  Reticulocyte count was low at 2% for the degree of anemia.    Coombs test was negative and haptoglobin was normal.  LDH was mildly elevated at 228.  Myeloma panel showed M protein of IgG kappa 0.5 g.  Both kappa and lambda free light chains were elevated with a normal free light chain ratio 0.77.  E Po level was 22.3   Patient underwent EGD and colonoscopy on 04/04/2018.  EGD showed nonobstructing nonbleeding superficial duodenal ulcers with a clean ulcer base.  There are nonbleeding erosions in the gastric antrum and no stigmata of recent bleeding.  Duodenum were suspicious for H. pylori.  Colonoscopy showed hemorrhoids and 7 mm cecal polyp  Interval history-patient has been doing well so far.  He has 89 birthday coming up in 4 days.  He continues to live alone  and is independent of his ADLs and needs assistance with his IADLs.  ECOG PS- 2 Pain scale- 0   Review of systems- Review of Systems  Constitutional:  Negative for chills, fever, malaise/fatigue and weight loss.  HENT:  Negative for congestion, ear discharge and nosebleeds.   Eyes:  Negative for blurred vision.  Respiratory:  Negative for cough, hemoptysis, sputum production, shortness of breath and wheezing.   Cardiovascular:  Negative for chest pain, palpitations, orthopnea and claudication.  Gastrointestinal:  Negative for abdominal pain, blood in stool, constipation, diarrhea, heartburn, melena, nausea and vomiting.  Genitourinary:  Negative for dysuria, flank pain, frequency, hematuria and urgency.  Musculoskeletal:  Negative for back pain, joint pain and myalgias.  Skin:  Negative for rash.  Neurological:  Negative for dizziness, tingling, focal weakness, seizures, weakness and headaches.  Endo/Heme/Allergies:  Does not bruise/bleed easily.  Psychiatric/Behavioral:  Negative for depression and suicidal ideas. The patient does not have insomnia.       No Known Allergies   Past Medical History:  Diagnosis Date   A-fib ([place])    Anemia    Asthma    Cardiomyopathy ([place])    CHF (congestive heart failure) ([facility])    DJD (degenerative joint disease)    Duodenal ulcer 08/09/2015   Dysrhythmia    Erosive esophagitis    HH (hiatus hernia)    HLD (hyperlipidemia)    Hypertension    Pacemaker    PAD (peripheral artery  disease) ([place])    Peripheral vascular disease ([place])    Presence of permanent cardiac pacemaker    Prostate cancer [facility])    Prostatectomy.   Rectal varices 08/09/2015   Shortness of breath    SSS (sick sinus syndrome) [facility])      Past Surgical History:  Procedure Laterality Date   CATARACT EXTRACTION, BILATERAL     COLONOSCOPY N/A 04/04/2018   Procedure: COLONOSCOPY;  Surgeon: Lin Landsman, MD;  Location: [facility] ENDOSCOPY;  Service: Gastroenterology;   Laterality: N/A;   ESOPHAGOGASTRODUODENOSCOPY N/A 08/09/2015   Procedure: ESOPHAGOGASTRODUODENOSCOPY (EGD) looking in the stomach with a lighted tube to evaluate and treat;  Surgeon: Lollie Sails, MD;  Location: [facility] ENDOSCOPY;  Service: Endoscopy;  Laterality: N/A;  Procedure to be done tomorrow afternoon, 08/08/2015. Awaiting cardiology consult   ESOPHAGOGASTRODUODENOSCOPY N/A 04/04/2018   Procedure: ESOPHAGOGASTRODUODENOSCOPY (EGD);  Surgeon: Lin Landsman, MD;  Location: [facility] ENDOSCOPY;  Service: Gastroenterology;  Laterality: N/A;   ESOPHAGOGASTRODUODENOSCOPY (EGD) WITH PROPOFOL N/A 01/25/2021   Procedure: ESOPHAGOGASTRODUODENOSCOPY (EGD) WITH PROPOFOL;  Surgeon: Lesly Rubenstein, MD;  Location: [facility] ENDOSCOPY;  Service: Gastroenterology;  Laterality: N/A;   EYE SURGERY     FEMORAL-POPLITEAL BYPASS GRAFT Left 12/16/2017   Procedure: BYPASS GRAFT FEMORAL-POPLITEAL ARTERY ( OPEN POPLITEAL BYPASS );  Surgeon: Algernon Huxley, MD;  Location: [facility] ORS;  Service: Vascular;  Laterality: Left;   FLEXIBLE SIGMOIDOSCOPY N/A 08/09/2015   Procedure: FLEXIBLE SIGMOIDOSCOPY looking up the rectum into the distal colon with a lighted tube to examine and treat;  Surgeon: Lollie Sails, MD;  Location: [facility] ENDOSCOPY;  Service: Endoscopy;  Laterality: N/A;  Procedure to be done tomorrow afternoon, 08/08/2015. Awaiting cardiology consult.   INSERT / REPLACE / REMOVE PACEMAKER     JOINT REPLACEMENT Left    left knee replacement   LOWER EXTREMITY ANGIOGRAPHY Left 11/16/2017   Procedure: LOWER EXTREMITY ANGIOGRAPHY;  Surgeon: Algernon Huxley, MD;  Location: [place] CV LAB;  Service: Cardiovascular;  Laterality: Left;   LOWER EXTREMITY ANGIOGRAPHY Right 11/30/2017   Procedure: LOWER EXTREMITY ANGIOGRAPHY;  Surgeon: Algernon Huxley, MD;  Location: [place] CV LAB;  Service: Cardiovascular;  Laterality: Right;   LOWER EXTREMITY ANGIOGRAPHY Left 03/04/2018   Procedure: LOWER EXTREMITY ANGIOGRAPHY;   Surgeon: Algernon Huxley, MD;  Location: [place] CV LAB;  Service: Cardiovascular;  Laterality: Left;   LOWER EXTREMITY ANGIOGRAPHY Left 04/12/2018   Procedure: LOWER EXTREMITY ANGIOGRAPHY;  Surgeon: Algernon Huxley, MD;  Location: [place] CV LAB;  Service: Cardiovascular;  Laterality: Left;   PACEMAKER INSERTION     PPM GENERATOR CHANGEOUT N/A 04/10/2021   Procedure: PPM GENERATOR CHANGEOUT;  Surgeon: [name], MD;  Location: [place] CV LAB;  Service: Cardiovascular;  Laterality: N/A;   PROSTATE SURGERY     PROSTATE SURGERY      Social History   Socioeconomic History   Marital status: Widowed    Spouse name: Not on file   Number of children: Not on file   Years of education: Not on file   Highest education level: Not on file  Occupational History    Employer: RETIRED/[employer]  Tobacco Use   Smoking status: Former   Smokeless tobacco: Former    Types: Chew, Snuff    Quit date: 1972  Vaping Use   Vaping Use: Never used  Substance and Sexual Activity   Alcohol use: No    Alcohol/week: 0.0 standard drinks   Drug use: No  Sexual activity: Not Currently  Other Topics Concern   Not on file  Social History Narrative   Not on file   Social Determinants of Health   Financial Resource Strain: Not on file  Food Insecurity: Not on file  Transportation Needs: Not on file  Physical Activity: Not on file  Stress: Not on file  Social Connections: Not on file  Intimate Partner Violence: Not on file    Family History  Problem Relation Age of Onset   Lung cancer Father      Current Outpatient Medications:    amLODipine (NORVASC) 10 MG tablet, Take 10 mg by mouth daily., Disp: , Rfl:    ANORO ELLIPTA 62.5-25 MCG/INH AEPB, Inhale 1 puff into the lungs 2 (two) times daily as needed (SOB/ Wheezing)., Disp: , Rfl:    aspirin EC 81 MG EC tablet, Take 1 tablet (81 mg total) by mouth daily. Swallow whole., Disp: 30 tablet, Rfl: 11   benazepril  (LOTENSIN) 20 MG tablet, Take 20 mg by mouth daily., Disp: , Rfl:    feeding supplement, ENSURE ENLIVE, (ENSURE ENLIVE) LIQD, Take 1 Bottle by mouth 2 (two) times a week., Disp: , Rfl:    ferrous sulfate 325 (65 FE) MG tablet, Take 1 tablet (325 mg total) by mouth 3 (three) times daily with meals. (Patient taking differently: Take 325 mg by mouth daily with breakfast.), Disp: 90 tablet, Rfl: 1   furosemide (LASIX) 20 MG tablet, Take 20 mg by mouth daily., Disp: , Rfl:    metoprolol tartrate (LOPRESSOR) 25 MG tablet, Take 25 mg by mouth 2 (two) times daily., Disp: , Rfl:    pantoprazole (PROTONIX) 40 MG tablet, Take 1 tablet (40 mg total) by mouth 2 (two) times daily before a meal., Disp: 60 tablet, Rfl: 1   simvastatin (ZOCOR) 40 MG tablet, Take 40 mg by mouth daily., Disp: , Rfl:    traMADol (ULTRAM) 50 MG tablet, Take 1 tablet (50 mg total) by mouth every 6 (six) hours as needed for moderate pain or severe pain., Disp: 30 tablet, Rfl: 0   traZODone (DESYREL) 50 MG tablet, Take 50 mg by mouth at bedtime., Disp: , Rfl:    vitamin B-12 (CYANOCOBALAMIN) 100 MCG tablet, Take 100 mcg by mouth daily., Disp: , Rfl:   Physical exam:  Vitals:   07/23/21 1041  BP: (!) 154/76  Pulse: (!) 59  Resp: 16  Temp: 98.2 F (36.8 C)  TempSrc: Oral  Weight: 158 lb (71.7 kg)  Height: 5\' 10"  (1.778 m)   Physical Exam Constitutional:      General: He is not in acute distress. Cardiovascular:     Rate and Rhythm: Normal rate and regular rhythm.     Heart sounds: Normal heart sounds.  Pulmonary:     Effort: Pulmonary effort is normal.     Breath sounds: Normal breath sounds.  Abdominal:     General: Bowel sounds are normal.     Palpations: Abdomen is soft.  Skin:    General: Skin is warm and dry.  Neurological:     Mental Status: He is alert and oriented to person, place, and time.     CMP Latest Ref Rng & Units 02/05/2021  Glucose 70 - 99 mg/dL 98  BUN 8 - 23 mg/dL 63(H)  Creatinine 0.61 - 1.24  mg/dL 1.96(H)  Sodium 135 - 145 mmol/L 140  Potassium 3.5 - 5.1 mmol/L 4.5  Chloride 98 - 111 mmol/L 110  CO2 22 - 32  mmol/L 26  Calcium 8.9 - 10.3 mg/dL 8.7(L)  Total Protein 6.5 - 8.1 g/dL -  Total Bilirubin 0.3 - 1.2 mg/dL -  Alkaline Phos 38 - 126 U/L -  AST 15 - 41 U/L -  ALT 0 - 44 U/L -   CBC Latest Ref Rng & Units 07/23/2021  WBC 4.0 - 10.5 K/uL 8.6  Hemoglobin 13.0 - 17.0 g/dL 9.6(L)  Hematocrit 39.0 - 52.0 % 30.2(L)  Platelets 150 - 400 K/uL 225     Assessment and plan- Patient is a 87 y.o. male with history of anemia mostly due to anemia of CKD and likely a component of iron deficiency here for routine follow-up  Patient's hemoglobin has been stable between 9-10 after receiving Retacrit.  Is 9.6 today and he will proceed with Retacrit today and continue to receive it every 3 weeks.  He does drop his hemoglobin intermittently requiring blood transfusions.  Overall he is doing well for his age and my plan is to continue Retacrit every 3 weeks at present dose.  Repeat CBC ferritin and iron studies in 3 in 6 months and I will see him in 6 months   Visit Diagnosis 1. Erythropoietin (EPO) stimulating agent anemia management patient   2. Anemia of chronic kidney failure, stage 3 (moderate) (HCC)      Dr. Randa Evens, MD, MPH [facility] at [facility] [phone] 07/23/2021 4:33 PM

## 2021-07-23 NOTE — Progress Notes (Signed)
Pt doing ok, eating and drinking good, goo BM. He states that he feels ok for 87 years  old

## 2021-08-13 ENCOUNTER — Inpatient Hospital Stay: Payer: Medicare HMO

## 2021-08-13 VITALS — BP 148/73 | HR 63

## 2021-08-13 DIAGNOSIS — D631 Anemia in chronic kidney disease: Secondary | ICD-10-CM

## 2021-08-13 DIAGNOSIS — D509 Iron deficiency anemia, unspecified: Secondary | ICD-10-CM

## 2021-08-13 DIAGNOSIS — N183 Chronic kidney disease, stage 3 unspecified: Secondary | ICD-10-CM | POA: Diagnosis not present

## 2021-08-13 LAB — HEMOGLOBIN AND HEMATOCRIT, BLOOD
HCT: 30.2 % — ABNORMAL LOW (ref 39.0–52.0)
Hemoglobin: 9.7 g/dL — ABNORMAL LOW (ref 13.0–17.0)

## 2021-08-13 MED ORDER — EPOETIN ALFA-EPBX 40000 UNIT/ML IJ SOLN
40000.0000 [IU] | Freq: Once | INTRAMUSCULAR | Status: AC
  Administered 2021-08-13: 40000 [IU] via SUBCUTANEOUS
  Filled 2021-08-13: qty 1

## 2021-08-13 MED ORDER — EPOETIN ALFA-EPBX 10000 UNIT/ML IJ SOLN
10000.0000 [IU] | Freq: Once | INTRAMUSCULAR | Status: AC
  Administered 2021-08-13: 10000 [IU] via SUBCUTANEOUS
  Filled 2021-08-13: qty 1

## 2021-08-13 MED ORDER — EPOETIN ALFA-EPBX 20000 UNIT/ML IJ SOLN: 50000.0000 [IU] | INTRAMUSCULAR | Status: DC

## 2021-09-03 ENCOUNTER — Inpatient Hospital Stay: Payer: Medicare HMO | Attending: Oncology

## 2021-09-03 ENCOUNTER — Inpatient Hospital Stay: Payer: Medicare HMO

## 2021-09-03 VITALS — BP 153/69 | HR 64

## 2021-09-03 DIAGNOSIS — D631 Anemia in chronic kidney disease: Secondary | ICD-10-CM | POA: Diagnosis not present

## 2021-09-03 DIAGNOSIS — N189 Chronic kidney disease, unspecified: Secondary | ICD-10-CM

## 2021-09-03 DIAGNOSIS — D509 Iron deficiency anemia, unspecified: Secondary | ICD-10-CM

## 2021-09-03 DIAGNOSIS — N183 Chronic kidney disease, stage 3 unspecified: Secondary | ICD-10-CM | POA: Diagnosis present

## 2021-09-03 LAB — HEMOGLOBIN AND HEMATOCRIT, BLOOD
HCT: 34.1 % — ABNORMAL LOW (ref 39.0–52.0)
Hemoglobin: 10.8 g/dL — ABNORMAL LOW (ref 13.0–17.0)

## 2021-09-03 MED ORDER — EPOETIN ALFA-EPBX 20000 UNIT/ML IJ SOLN: 50000.0000 [IU] | INTRAMUSCULAR | Status: DC

## 2021-09-24 ENCOUNTER — Inpatient Hospital Stay: Payer: Medicare HMO | Attending: Oncology

## 2021-09-24 ENCOUNTER — Inpatient Hospital Stay: Payer: Medicare HMO

## 2021-09-24 VITALS — BP 132/62 | HR 62

## 2021-09-24 DIAGNOSIS — N183 Chronic kidney disease, stage 3 unspecified: Secondary | ICD-10-CM | POA: Diagnosis present

## 2021-09-24 DIAGNOSIS — Z79899 Other long term (current) drug therapy: Secondary | ICD-10-CM

## 2021-09-24 DIAGNOSIS — D631 Anemia in chronic kidney disease: Secondary | ICD-10-CM | POA: Insufficient documentation

## 2021-09-24 DIAGNOSIS — D509 Iron deficiency anemia, unspecified: Secondary | ICD-10-CM

## 2021-09-24 DIAGNOSIS — D649 Anemia, unspecified: Secondary | ICD-10-CM

## 2021-09-24 LAB — COMPREHENSIVE METABOLIC PANEL
ALT: 51 U/L — ABNORMAL HIGH (ref 0–44)
AST: 32 U/L (ref 15–41)
Albumin: 3.2 g/dL — ABNORMAL LOW (ref 3.5–5.0)
Alkaline Phosphatase: 69 U/L (ref 38–126)
Anion gap: 8 (ref 5–15)
BUN: 35 mg/dL — ABNORMAL HIGH (ref 8–23)
CO2: 25 mmol/L (ref 22–32)
Calcium: 8.8 mg/dL — ABNORMAL LOW (ref 8.9–10.3)
Chloride: 101 mmol/L (ref 98–111)
Creatinine, Ser: 2.37 mg/dL — ABNORMAL HIGH (ref 0.61–1.24)
GFR, Estimated: 24 mL/min — ABNORMAL LOW (ref >60)
Glucose, Bld: 99 mg/dL (ref 70–99)
Potassium: 3.9 mmol/L (ref 3.5–5.1)
Sodium: 134 mmol/L — ABNORMAL LOW (ref 135–145)
Total Bilirubin: <0.1 mg/dL — ABNORMAL LOW (ref 0.3–1.2)
Total Protein: 6.7 g/dL (ref 6.5–8.1)

## 2021-09-24 LAB — CBC WITH DIFFERENTIAL/PLATELET
Abs Immature Granulocytes: 0.05 10*3/uL (ref 0.00–0.07)
Basophils Absolute: 0.1 10*3/uL (ref 0.0–0.1)
Basophils Relative: 1 %
Eosinophils Absolute: 0.3 10*3/uL (ref 0.0–0.5)
Eosinophils Relative: 3 %
HCT: 31.6 % — ABNORMAL LOW (ref 39.0–52.0)
Hemoglobin: 10 g/dL — ABNORMAL LOW (ref 13.0–17.0)
Immature Granulocytes: 1 %
Lymphocytes Relative: 15 %
Lymphs Abs: 1.3 10*3/uL (ref 0.7–4.0)
MCH: 29 pg (ref 26.0–34.0)
MCHC: 31.6 g/dL (ref 30.0–36.0)
MCV: 91.6 fL (ref 80.0–100.0)
Monocytes Absolute: 0.7 10*3/uL (ref 0.1–1.0)
Monocytes Relative: 8 %
Neutro Abs: 6.3 10*3/uL (ref 1.7–7.7)
Neutrophils Relative %: 72 %
Platelets: 217 10*3/uL (ref 150–400)
RBC: 3.45 MIL/uL — ABNORMAL LOW (ref 4.22–5.81)
RDW: 15.3 % (ref 11.5–15.5)
WBC: 8.6 10*3/uL (ref 4.0–10.5)
nRBC: 0 % (ref 0.0–0.2)

## 2021-09-24 LAB — FERRITIN: Ferritin: 456 ng/mL — ABNORMAL HIGH (ref 24–336)

## 2021-09-24 LAB — IRON AND TIBC
Iron: 38 ug/dL — ABNORMAL LOW (ref 45–182)
Saturation Ratios: 19 % (ref 17.9–39.5)
TIBC: 204 ug/dL — ABNORMAL LOW (ref 250–450)
UIBC: 166 ug/dL

## 2021-09-24 MED ORDER — EPOETIN ALFA-EPBX 40000 UNIT/ML IJ SOLN
40000.0000 [IU] | Freq: Once | INTRAMUSCULAR | Status: AC
  Administered 2021-09-24: 40000 [IU] via SUBCUTANEOUS
  Filled 2021-09-24: qty 1

## 2021-09-24 MED ORDER — EPOETIN ALFA-EPBX 10000 UNIT/ML IJ SOLN
10000.0000 [IU] | Freq: Once | INTRAMUSCULAR | Status: AC
  Administered 2021-09-24: 10000 [IU] via SUBCUTANEOUS
  Filled 2021-09-24: qty 1

## 2021-09-24 MED ORDER — EPOETIN ALFA-EPBX 20000 UNIT/ML IJ SOLN: 50000.0000 [IU] | INTRAMUSCULAR | Status: DC

## 2021-10-15 ENCOUNTER — Inpatient Hospital Stay: Payer: Medicare HMO

## 2021-10-15 ENCOUNTER — Other Ambulatory Visit: Payer: Self-pay

## 2021-10-15 ENCOUNTER — Inpatient Hospital Stay: Payer: Medicare HMO | Attending: Oncology

## 2021-10-15 DIAGNOSIS — D509 Iron deficiency anemia, unspecified: Secondary | ICD-10-CM | POA: Insufficient documentation

## 2021-10-15 DIAGNOSIS — N183 Chronic kidney disease, stage 3 unspecified: Secondary | ICD-10-CM | POA: Diagnosis not present

## 2021-10-15 DIAGNOSIS — D631 Anemia in chronic kidney disease: Secondary | ICD-10-CM

## 2021-10-15 LAB — HEMOGLOBIN AND HEMATOCRIT, BLOOD
HCT: 33 % — ABNORMAL LOW (ref 39.0–52.0)
Hemoglobin: 10.6 g/dL — ABNORMAL LOW (ref 13.0–17.0)

## 2021-11-05 ENCOUNTER — Inpatient Hospital Stay: Payer: Medicare HMO

## 2021-11-05 ENCOUNTER — Other Ambulatory Visit: Payer: Self-pay

## 2021-11-05 DIAGNOSIS — D509 Iron deficiency anemia, unspecified: Secondary | ICD-10-CM | POA: Diagnosis not present

## 2021-11-05 DIAGNOSIS — D631 Anemia in chronic kidney disease: Secondary | ICD-10-CM

## 2021-11-05 LAB — HEMOGLOBIN AND HEMATOCRIT, BLOOD
HCT: 32.9 % — ABNORMAL LOW (ref 39.0–52.0)
Hemoglobin: 10.5 g/dL — ABNORMAL LOW (ref 13.0–17.0)

## 2021-11-26 ENCOUNTER — Inpatient Hospital Stay: Payer: Medicare HMO | Attending: Oncology

## 2021-11-26 ENCOUNTER — Inpatient Hospital Stay: Payer: Medicare HMO

## 2021-12-10 ENCOUNTER — Other Ambulatory Visit: Payer: Self-pay | Admitting: *Deleted

## 2021-12-10 DIAGNOSIS — D509 Iron deficiency anemia, unspecified: Secondary | ICD-10-CM

## 2021-12-17 ENCOUNTER — Inpatient Hospital Stay: Payer: Medicare HMO | Attending: Oncology

## 2021-12-17 ENCOUNTER — Inpatient Hospital Stay: Payer: Medicare HMO

## 2022-01-07 ENCOUNTER — Inpatient Hospital Stay: Payer: Medicare HMO

## 2022-01-28 ENCOUNTER — Inpatient Hospital Stay: Payer: Medicare HMO | Admitting: Oncology

## 2022-01-28 ENCOUNTER — Inpatient Hospital Stay: Payer: Medicare HMO

## 2022-01-28 ENCOUNTER — Telehealth: Payer: Self-pay | Admitting: *Deleted

## 2022-01-28 NOTE — Telephone Encounter (Signed)
Granddaughter needs to reschedule patient's appointments due to a change in her work schedule.

## 2022-01-29 ENCOUNTER — Encounter: Payer: Self-pay | Admitting: Oncology

## 2022-01-30 ENCOUNTER — Telehealth: Payer: Self-pay | Admitting: *Deleted

## 2022-01-30 NOTE — Telephone Encounter (Signed)
Patient's daughter called to request that all appointments be rescheduled to the morning between 9A and 11A.

## 2022-02-04 ENCOUNTER — Encounter: Payer: Self-pay | Admitting: Nurse Practitioner

## 2022-02-04 ENCOUNTER — Inpatient Hospital Stay: Payer: Medicare HMO | Admitting: Oncology

## 2022-02-04 ENCOUNTER — Inpatient Hospital Stay: Payer: Medicare HMO

## 2022-02-04 ENCOUNTER — Inpatient Hospital Stay (HOSPITAL_BASED_OUTPATIENT_CLINIC_OR_DEPARTMENT_OTHER): Payer: Medicare HMO | Admitting: Nurse Practitioner

## 2022-02-04 ENCOUNTER — Inpatient Hospital Stay: Payer: Medicare HMO | Attending: Oncology

## 2022-02-04 ENCOUNTER — Other Ambulatory Visit: Payer: Self-pay

## 2022-02-04 VITALS — BP 131/95 | HR 59 | Temp 98.2°F | Resp 20 | Wt 158.0 lb

## 2022-02-04 DIAGNOSIS — D649 Anemia, unspecified: Secondary | ICD-10-CM

## 2022-02-04 DIAGNOSIS — D509 Iron deficiency anemia, unspecified: Secondary | ICD-10-CM

## 2022-02-04 DIAGNOSIS — D631 Anemia in chronic kidney disease: Secondary | ICD-10-CM | POA: Insufficient documentation

## 2022-02-04 DIAGNOSIS — N184 Chronic kidney disease, stage 4 (severe): Secondary | ICD-10-CM | POA: Diagnosis not present

## 2022-02-04 DIAGNOSIS — Z79899 Other long term (current) drug therapy: Secondary | ICD-10-CM

## 2022-02-04 LAB — CBC WITH DIFFERENTIAL/PLATELET
Abs Immature Granulocytes: 0.05 10*3/uL (ref 0.00–0.07)
Basophils Absolute: 0 10*3/uL (ref 0.0–0.1)
Basophils Relative: 1 %
Eosinophils Absolute: 0.2 10*3/uL (ref 0.0–0.5)
Eosinophils Relative: 2 %
HCT: 28.5 % — ABNORMAL LOW (ref 39.0–52.0)
Hemoglobin: 9.3 g/dL — ABNORMAL LOW (ref 13.0–17.0)
Immature Granulocytes: 1 %
Lymphocytes Relative: 12 %
Lymphs Abs: 1 10*3/uL (ref 0.7–4.0)
MCH: 30.3 pg (ref 26.0–34.0)
MCHC: 32.6 g/dL (ref 30.0–36.0)
MCV: 92.8 fL (ref 80.0–100.0)
Monocytes Absolute: 0.5 10*3/uL (ref 0.1–1.0)
Monocytes Relative: 6 %
Neutro Abs: 6.5 10*3/uL (ref 1.7–7.7)
Neutrophils Relative %: 78 %
Platelets: 177 10*3/uL (ref 150–400)
RBC: 3.07 MIL/uL — ABNORMAL LOW (ref 4.22–5.81)
RDW: 15.9 % — ABNORMAL HIGH (ref 11.5–15.5)
WBC: 8.2 10*3/uL (ref 4.0–10.5)
nRBC: 0 % (ref 0.0–0.2)

## 2022-02-04 LAB — IRON AND TIBC
Iron: 41 ug/dL — ABNORMAL LOW (ref 45–182)
Saturation Ratios: 19 % (ref 17.9–39.5)
TIBC: 214 ug/dL — ABNORMAL LOW (ref 250–450)
UIBC: 173 ug/dL

## 2022-02-04 LAB — COMPREHENSIVE METABOLIC PANEL
ALT: 21 U/L (ref 0–44)
AST: 21 U/L (ref 15–41)
Albumin: 3.2 g/dL — ABNORMAL LOW (ref 3.5–5.0)
Alkaline Phosphatase: 68 U/L (ref 38–126)
Anion gap: 7 (ref 5–15)
BUN: 34 mg/dL — ABNORMAL HIGH (ref 8–23)
CO2: 24 mmol/L (ref 22–32)
Calcium: 8.7 mg/dL — ABNORMAL LOW (ref 8.9–10.3)
Chloride: 102 mmol/L (ref 98–111)
Creatinine, Ser: 2.42 mg/dL — ABNORMAL HIGH (ref 0.61–1.24)
GFR, Estimated: 24 mL/min — ABNORMAL LOW (ref >60)
Glucose, Bld: 99 mg/dL (ref 70–99)
Potassium: 3.8 mmol/L (ref 3.5–5.1)
Sodium: 133 mmol/L — ABNORMAL LOW (ref 135–145)
Total Bilirubin: 0.5 mg/dL (ref 0.3–1.2)
Total Protein: 6.6 g/dL (ref 6.5–8.1)

## 2022-02-04 LAB — FERRITIN: Ferritin: 630 ng/mL — ABNORMAL HIGH (ref 24–336)

## 2022-02-04 MED ORDER — EPOETIN ALFA-EPBX 20000 UNIT/ML IJ SOLN: 50000.0000 [IU] | INTRAMUSCULAR | Status: DC

## 2022-02-04 MED ORDER — EPOETIN ALFA-EPBX 40000 UNIT/ML IJ SOLN
40000.0000 [IU] | Freq: Once | INTRAMUSCULAR | Status: AC
  Administered 2022-02-04: 40000 [IU] via SUBCUTANEOUS
  Filled 2022-02-04: qty 1

## 2022-02-04 MED ORDER — EPOETIN ALFA-EPBX 40000 UNIT/ML IJ SOLN: 40000.0000 [IU] | Freq: Once | INTRAMUSCULAR | Status: DC

## 2022-02-04 MED ORDER — EPOETIN ALFA-EPBX 10000 UNIT/ML IJ SOLN
10000.0000 [IU] | Freq: Once | INTRAMUSCULAR | Status: AC
  Administered 2022-02-04: 10000 [IU] via SUBCUTANEOUS
  Filled 2022-02-04: qty 1

## 2022-02-04 MED ORDER — EPOETIN ALFA-EPBX 10000 UNIT/ML IJ SOLN: 10000.0000 [IU] | Freq: Once | INTRAMUSCULAR | Status: DC

## 2022-02-04 NOTE — Progress Notes (Signed)
Hematology/Oncology Consult Note [facility]  [phone] [phone]  Patient Care Team: Albina Billet, MD as PCP - General (Internal Medicine) Sindy Guadeloupe, MD as Consulting Physician (Hematology and Oncology)   Name of the patient: Devin Washington  [id number]  1923/06/09   Date of visit: 02/04/22  Diagnosis- anemia likely multifactorial secondary to iron deficiency and chronic kidney disease   Chief complaint/ Reason for visit-routine follow-up of anemia of chronic kidney disease  Heme/Onc history: patient is a 88 year old male with a past medical history significant for atrial fibrillation, CHF, hypertension and prostate cancer who presented to the ER after he suffered a fall.  On admission he was found to have H&H of 7.1/22.2 with an MCV of 80.1 and a platelet count of 268.  BMP showed an elevated creatinine of 1.9.  TSH was normal at 0.52.  Patient received 1 unit of PRBC in the ER.  Ferritin checked per day later was 152.  Iron studies revealed low serum iron of 27, low TIBC of 229 and low iron saturation of 12%.  B12 levels were low normal at 264.  Folate level was normal at 17.5.  Reticulocyte count was low at 2% for the degree of anemia.    Coombs test was negative and haptoglobin was normal.  LDH was mildly elevated at 228.  Myeloma panel showed M protein of IgG kappa 0.5 g.  Both kappa and lambda free light chains were elevated with a normal free light chain ratio 0.77.  E Po level was 22.3   Patient underwent EGD and colonoscopy on 04/04/2018.  EGD showed nonobstructing nonbleeding superficial duodenal ulcers with a clean ulcer base.  There are nonbleeding erosions in the gastric antrum and no stigmata of recent bleeding.  Duodenum were suspicious for H. pylori.  Colonoscopy showed hemorrhoids and 7 mm cecal polyp  Interval history- Patient continues to do well. He is 45, lives at home alone and remains indepdent of his ADLs, some  assistance with IADLs. Denies complaints other than being hungry and excited to go eat hamburgers with his granddaughter. No interval blood clots. He continues to see nephrology.   ECOG PS- 2 Pain scale- 0   Review of systems- Review of Systems  Constitutional:  Negative for chills, fever, malaise/fatigue and weight loss.  HENT:  Negative for congestion, ear discharge and nosebleeds.   Eyes:  Negative for blurred vision.  Respiratory:  Negative for cough, hemoptysis, sputum production, shortness of breath and wheezing.   Cardiovascular:  Negative for chest pain, palpitations, orthopnea and claudication.  Gastrointestinal:  Negative for abdominal pain, blood in stool, constipation, diarrhea, heartburn, melena, nausea and vomiting.  Genitourinary:  Negative for dysuria, flank pain, frequency, hematuria and urgency.  Musculoskeletal:  Negative for back pain, joint pain and myalgias.  Skin:  Negative for rash.  Neurological:  Negative for dizziness, tingling, focal weakness, seizures, weakness and headaches.  Endo/Heme/Allergies:  Does not bruise/bleed easily.  Psychiatric/Behavioral:  Negative for depression and suicidal ideas. The patient does not have insomnia.      No Known Allergies   Past Medical History:  Diagnosis Date   A-fib ([place])    Anemia    Asthma    Cardiomyopathy ([place])    CHF (congestive heart failure) (HCC)    DJD (degenerative joint disease)    Duodenal ulcer 08/09/2015   Dysrhythmia    Erosive esophagitis    HH (hiatus hernia)    HLD (hyperlipidemia)  Hypertension    Pacemaker    PAD (peripheral artery disease) (HCC)    Peripheral vascular disease (HCC)    Presence of permanent cardiac pacemaker    Prostate cancer [facility])    Prostatectomy.   Rectal varices 08/09/2015   Shortness of breath    SSS (sick sinus syndrome) [facility])      Past Surgical History:  Procedure Laterality Date   CATARACT EXTRACTION, BILATERAL     COLONOSCOPY N/A 04/04/2018    Procedure: COLONOSCOPY;  Surgeon: Lin Landsman, MD;  Location: [facility] ENDOSCOPY;  Service: Gastroenterology;  Laterality: N/A;   ESOPHAGOGASTRODUODENOSCOPY N/A 08/09/2015   Procedure: ESOPHAGOGASTRODUODENOSCOPY (EGD) looking in the stomach with a lighted tube to evaluate and treat;  Surgeon: Lollie Sails, MD;  Location: [facility] ENDOSCOPY;  Service: Endoscopy;  Laterality: N/A;  Procedure to be done tomorrow afternoon, 08/08/2015. Awaiting cardiology consult   ESOPHAGOGASTRODUODENOSCOPY N/A 04/04/2018   Procedure: ESOPHAGOGASTRODUODENOSCOPY (EGD);  Surgeon: Lin Landsman, MD;  Location: [facility] ENDOSCOPY;  Service: Gastroenterology;  Laterality: N/A;   ESOPHAGOGASTRODUODENOSCOPY (EGD) WITH PROPOFOL N/A 01/25/2021   Procedure: ESOPHAGOGASTRODUODENOSCOPY (EGD) WITH PROPOFOL;  Surgeon: Lesly Rubenstein, MD;  Location: [facility] ENDOSCOPY;  Service: Gastroenterology;  Laterality: N/A;   EYE SURGERY     FEMORAL-POPLITEAL BYPASS GRAFT Left 12/16/2017   Procedure: BYPASS GRAFT FEMORAL-POPLITEAL ARTERY ( OPEN POPLITEAL BYPASS );  Surgeon: Algernon Huxley, MD;  Location: [facility] ORS;  Service: Vascular;  Laterality: Left;   FLEXIBLE SIGMOIDOSCOPY N/A 08/09/2015   Procedure: FLEXIBLE SIGMOIDOSCOPY looking up the rectum into the distal colon with a lighted tube to examine and treat;  Surgeon: Lollie Sails, MD;  Location: [facility] ENDOSCOPY;  Service: Endoscopy;  Laterality: N/A;  Procedure to be done tomorrow afternoon, 08/08/2015. Awaiting cardiology consult.   INSERT / REPLACE / REMOVE PACEMAKER     JOINT REPLACEMENT Left    left knee replacement   LOWER EXTREMITY ANGIOGRAPHY Left 11/16/2017   Procedure: LOWER EXTREMITY ANGIOGRAPHY;  Surgeon: Algernon Huxley, MD;  Location: [place] CV LAB;  Service: Cardiovascular;  Laterality: Left;   LOWER EXTREMITY ANGIOGRAPHY Right 11/30/2017   Procedure: LOWER EXTREMITY ANGIOGRAPHY;  Surgeon: Algernon Huxley, MD;  Location: [place] CV LAB;  Service: Cardiovascular;   Laterality: Right;   LOWER EXTREMITY ANGIOGRAPHY Left 03/04/2018   Procedure: LOWER EXTREMITY ANGIOGRAPHY;  Surgeon: Algernon Huxley, MD;  Location: [place] CV LAB;  Service: Cardiovascular;  Laterality: Left;   LOWER EXTREMITY ANGIOGRAPHY Left 04/12/2018   Procedure: LOWER EXTREMITY ANGIOGRAPHY;  Surgeon: Algernon Huxley, MD;  Location: [place] CV LAB;  Service: Cardiovascular;  Laterality: Left;   PACEMAKER INSERTION     PPM GENERATOR CHANGEOUT N/A 04/10/2021   Procedure: PPM GENERATOR CHANGEOUT;  Surgeon: [name], MD;  Location: [place] CV LAB;  Service: Cardiovascular;  Laterality: N/A;   PROSTATE SURGERY     PROSTATE SURGERY      Social History   Socioeconomic History   Marital status: Widowed    Spouse name: Not on file   Number of children: Not on file   Years of education: Not on file   Highest education level: Not on file  Occupational History    Employer: RETIRED/[employer]  Tobacco Use   Smoking status: Former   Smokeless tobacco: Former    Types: Chew, Snuff    Quit date: 1972  Vaping Use   Vaping Use: Never used  Substance and Sexual Activity   Alcohol use: No  Alcohol/week: 0.0 standard drinks   Drug use: No   Sexual activity: Not Currently  Other Topics Concern   Not on file  Social History Narrative   Not on file   Social Determinants of Health   Financial Resource Strain: Not on file  Food Insecurity: Not on file  Transportation Needs: Not on file  Physical Activity: Not on file  Stress: Not on file  Social Connections: Not on file  Intimate Partner Violence: Not on file    Family History  Problem Relation Age of Onset   Lung cancer Father      Current Outpatient Medications:    amLODipine (NORVASC) 10 MG tablet, Take 10 mg by mouth daily., Disp: , Rfl:    ANORO ELLIPTA 62.5-25 MCG/INH AEPB, Inhale 1 puff into the lungs 2 (two) times daily as needed (SOB/ Wheezing)., Disp: , Rfl:    aspirin EC 81 MG EC  tablet, Take 1 tablet (81 mg total) by mouth daily. Swallow whole., Disp: 30 tablet, Rfl: 11   benazepril (LOTENSIN) 20 MG tablet, Take 20 mg by mouth daily., Disp: , Rfl:    feeding supplement, ENSURE ENLIVE, (ENSURE ENLIVE) LIQD, Take 1 Bottle by mouth 2 (two) times a week., Disp: , Rfl:    ferrous sulfate 325 (65 FE) MG tablet, Take 1 tablet (325 mg total) by mouth 3 (three) times daily with meals. (Patient taking differently: Take 325 mg by mouth daily with breakfast.), Disp: 90 tablet, Rfl: 1   furosemide (LASIX) 20 MG tablet, Take 20 mg by mouth daily., Disp: , Rfl:    metoprolol tartrate (LOPRESSOR) 25 MG tablet, Take 25 mg by mouth 2 (two) times daily., Disp: , Rfl:    pantoprazole (PROTONIX) 40 MG tablet, Take 1 tablet (40 mg total) by mouth 2 (two) times daily before a meal., Disp: 60 tablet, Rfl: 1   simvastatin (ZOCOR) 40 MG tablet, Take 40 mg by mouth daily., Disp: , Rfl:    traMADol (ULTRAM) 50 MG tablet, Take 1 tablet (50 mg total) by mouth every 6 (six) hours as needed for moderate pain or severe pain., Disp: 30 tablet, Rfl: 0   traZODone (DESYREL) 50 MG tablet, Take 50 mg by mouth at bedtime., Disp: , Rfl:    vitamin B-12 (CYANOCOBALAMIN) 100 MCG tablet, Take 100 mcg by mouth daily., Disp: , Rfl:  No current facility-administered medications for this visit.  Facility-Administered Medications Ordered in Other Visits:    epoetin alfa-epbx (RETACRIT) injection 40,000 Units, 40,000 Units, Subcutaneous, Once **AND** epoetin alfa-epbx (RETACRIT) injection 10,000 Units, 10,000 Units, Subcutaneous, Once, Verlon Au, NP  Physical exam:  Vitals:   02/04/22 1048  BP: (!) 131/95  Pulse: (!) 59  Resp: 20  Temp: 98.2 F (36.8 C)  SpO2: 100%  Weight: 158 lb (71.7 kg)   Physical Exam Constitutional:      General: He is not in acute distress.    Appearance: He is not ill-appearing.     Comments: Accompanied by granddaughter  Cardiovascular:     Rate and Rhythm: Normal rate and  regular rhythm.  Pulmonary:     Effort: Pulmonary effort is normal.     Breath sounds: Normal breath sounds.  Abdominal:     General: There is no distension.     Palpations: Abdomen is soft.  Musculoskeletal:     Comments: wheelchair  Skin:    General: Skin is warm and dry.  Neurological:     Mental Status: He is alert and oriented  to person, place, and time.  Psychiatric:        Mood and Affect: Mood normal.        Behavior: Behavior normal.     CMP Latest Ref Rng & Units 02/04/2022  Glucose 70 - 99 mg/dL 99  BUN 8 - 23 mg/dL 34(H)  Creatinine 0.61 - 1.24 mg/dL 2.42(H)  Sodium 135 - 145 mmol/L 133(L)  Potassium 3.5 - 5.1 mmol/L 3.8  Chloride 98 - 111 mmol/L 102  CO2 22 - 32 mmol/L 24  Calcium 8.9 - 10.3 mg/dL 8.7(L)  Total Protein 6.5 - 8.1 g/dL 6.6  Total Bilirubin 0.3 - 1.2 mg/dL 0.5  Alkaline Phos 38 - 126 U/L 68  AST 15 - 41 U/L 21  ALT 0 - 44 U/L 21   CBC Latest Ref Rng & Units 02/04/2022  WBC 4.0 - 10.5 K/uL 8.2  Hemoglobin 13.0 - 17.0 g/dL 9.3(L)  Hematocrit 39.0 - 52.0 % 28.5(L)  Platelets 150 - 400 K/uL 177   Iron/TIBC/Ferritin/ %Sat    Component Value Date/Time   IRON 38 (L) 09/24/2021 1514   TIBC 204 (L) 09/24/2021 1514   FERRITIN 456 (H) 09/24/2021 1514   IRONPCTSAT 19 09/24/2021 1514     Assessment and plan- Patient is a 88 y.o. male with   Anemia of Chronic Kidney Disease - gfr is stable at 24. Stage 4 CKD. He receives retacrit for hemoglobin < 10. Hemoglobin 9.3 today. Proceed with retacrit. Overall tolerating well. Continue retacrit every 3 weeks to maintain hemoglobin 10 or greater. Iron Deficiency Anemia- Iron studies pending at time of visit. He has not required IV iron/feraheme since May 2019.  Symptomatic Anemia- occasional drops in hemoglobin with intermittent requirement for blood transfusion. No indication for transfusion today. Monitor.   Disposition Retacrit today 3, 6, 9 weeks- lab (H&H), +/- retacrit 12 weeks- lab (cbc, ferritin,  iron studies), +/- retacrit 15, 18, 21 weeks- lab (H&H), +/- retacrit 24 weeks- lab (cbc, ferritin, iron studies), Dr. Janese Banks, +/- retacrit- la   Visit Diagnosis 1. Anemia in stage 4 chronic kidney disease ([place])   2. Erythropoietin (EPO) stimulating agent anemia management patient   3. Iron deficiency anemia, unspecified iron deficiency anemia type     Beckey Rutter, DNP, AGNP-C [place] at [facility] [phone] (clinic) 02/04/2022

## 2022-02-13 ENCOUNTER — Encounter: Payer: Self-pay | Admitting: Oncology

## 2022-02-25 ENCOUNTER — Inpatient Hospital Stay: Payer: Medicare HMO

## 2022-02-25 ENCOUNTER — Inpatient Hospital Stay: Payer: Medicare HMO | Attending: Oncology

## 2022-03-18 ENCOUNTER — Inpatient Hospital Stay: Payer: Medicare HMO

## 2022-03-18 ENCOUNTER — Other Ambulatory Visit: Payer: Self-pay | Admitting: *Deleted

## 2022-03-18 ENCOUNTER — Inpatient Hospital Stay: Payer: Medicare HMO | Attending: Oncology

## 2022-03-18 DIAGNOSIS — D631 Anemia in chronic kidney disease: Secondary | ICD-10-CM | POA: Insufficient documentation

## 2022-03-18 DIAGNOSIS — N184 Chronic kidney disease, stage 4 (severe): Secondary | ICD-10-CM | POA: Diagnosis not present

## 2022-03-18 LAB — HEMOGLOBIN AND HEMATOCRIT, BLOOD
HCT: 31.7 % — ABNORMAL LOW (ref 39.0–52.0)
Hemoglobin: 10 g/dL — ABNORMAL LOW (ref 13.0–17.0)

## 2022-04-08 ENCOUNTER — Inpatient Hospital Stay: Payer: Medicare HMO

## 2022-04-08 DIAGNOSIS — N184 Chronic kidney disease, stage 4 (severe): Secondary | ICD-10-CM

## 2022-04-08 LAB — HEMOGLOBIN AND HEMATOCRIT, BLOOD
HCT: 32 % — ABNORMAL LOW (ref 39.0–52.0)
Hemoglobin: 10.2 g/dL — ABNORMAL LOW (ref 13.0–17.0)

## 2022-04-29 ENCOUNTER — Inpatient Hospital Stay: Payer: Medicare HMO

## 2022-04-29 ENCOUNTER — Inpatient Hospital Stay: Payer: Medicare HMO | Attending: Nurse Practitioner

## 2022-04-29 VITALS — BP 145/63 | HR 60

## 2022-04-29 DIAGNOSIS — D509 Iron deficiency anemia, unspecified: Secondary | ICD-10-CM

## 2022-04-29 DIAGNOSIS — N184 Chronic kidney disease, stage 4 (severe): Secondary | ICD-10-CM | POA: Insufficient documentation

## 2022-04-29 DIAGNOSIS — D631 Anemia in chronic kidney disease: Secondary | ICD-10-CM | POA: Insufficient documentation

## 2022-04-29 LAB — CBC WITH DIFFERENTIAL/PLATELET
Abs Immature Granulocytes: 0.04 10*3/uL (ref 0.00–0.07)
Basophils Absolute: 0 10*3/uL (ref 0.0–0.1)
Basophils Relative: 1 %
Eosinophils Absolute: 0.2 10*3/uL (ref 0.0–0.5)
Eosinophils Relative: 2 %
HCT: 30.7 % — ABNORMAL LOW (ref 39.0–52.0)
Hemoglobin: 9.9 g/dL — ABNORMAL LOW (ref 13.0–17.0)
Immature Granulocytes: 1 %
Lymphocytes Relative: 14 %
Lymphs Abs: 1.2 10*3/uL (ref 0.7–4.0)
MCH: 29.6 pg (ref 26.0–34.0)
MCHC: 32.2 g/dL (ref 30.0–36.0)
MCV: 91.6 fL (ref 80.0–100.0)
Monocytes Absolute: 0.5 10*3/uL (ref 0.1–1.0)
Monocytes Relative: 6 %
Neutro Abs: 6.3 10*3/uL (ref 1.7–7.7)
Neutrophils Relative %: 76 %
Platelets: 187 10*3/uL (ref 150–400)
RBC: 3.35 MIL/uL — ABNORMAL LOW (ref 4.22–5.81)
RDW: 15.5 % (ref 11.5–15.5)
WBC: 8.2 10*3/uL (ref 4.0–10.5)
nRBC: 0 % (ref 0.0–0.2)

## 2022-04-29 LAB — FERRITIN: Ferritin: 574 ng/mL — ABNORMAL HIGH (ref 24–336)

## 2022-04-29 LAB — IRON AND TIBC
Iron: 36 ug/dL — ABNORMAL LOW (ref 45–182)
Saturation Ratios: 16 % — ABNORMAL LOW (ref 17.9–39.5)
TIBC: 225 ug/dL — ABNORMAL LOW (ref 250–450)
UIBC: 189 ug/dL

## 2022-04-29 MED ORDER — EPOETIN ALFA-EPBX 20000 UNIT/ML IJ SOLN: 50000.0000 [IU] | INTRAMUSCULAR | Status: DC

## 2022-04-29 MED ORDER — EPOETIN ALFA-EPBX 40000 UNIT/ML IJ SOLN
40000.0000 [IU] | Freq: Once | INTRAMUSCULAR | Status: AC
  Administered 2022-04-29: 40000 [IU] via SUBCUTANEOUS
  Filled 2022-04-29: qty 1

## 2022-04-29 MED ORDER — EPOETIN ALFA-EPBX 10000 UNIT/ML IJ SOLN
10000.0000 [IU] | Freq: Once | INTRAMUSCULAR | Status: AC
  Administered 2022-04-29: 10000 [IU] via SUBCUTANEOUS
  Filled 2022-04-29: qty 1

## 2022-05-20 ENCOUNTER — Inpatient Hospital Stay: Payer: Medicare HMO

## 2022-05-20 ENCOUNTER — Inpatient Hospital Stay: Payer: Medicare HMO | Attending: Oncology

## 2022-05-20 DIAGNOSIS — N184 Chronic kidney disease, stage 4 (severe): Secondary | ICD-10-CM | POA: Insufficient documentation

## 2022-05-20 DIAGNOSIS — D631 Anemia in chronic kidney disease: Secondary | ICD-10-CM | POA: Insufficient documentation

## 2022-06-09 ENCOUNTER — Other Ambulatory Visit: Payer: Self-pay | Admitting: Emergency Medicine

## 2022-06-09 DIAGNOSIS — D509 Iron deficiency anemia, unspecified: Secondary | ICD-10-CM

## 2022-06-10 ENCOUNTER — Inpatient Hospital Stay: Payer: Medicare HMO

## 2022-06-10 DIAGNOSIS — D631 Anemia in chronic kidney disease: Secondary | ICD-10-CM | POA: Diagnosis present

## 2022-06-10 DIAGNOSIS — N184 Chronic kidney disease, stage 4 (severe): Secondary | ICD-10-CM | POA: Diagnosis not present

## 2022-06-10 DIAGNOSIS — D509 Iron deficiency anemia, unspecified: Secondary | ICD-10-CM

## 2022-06-10 LAB — HEMOGLOBIN AND HEMATOCRIT, BLOOD
HCT: 32.8 % — ABNORMAL LOW (ref 39.0–52.0)
Hemoglobin: 10.5 g/dL — ABNORMAL LOW (ref 13.0–17.0)

## 2022-07-01 ENCOUNTER — Inpatient Hospital Stay: Payer: Medicare HMO

## 2022-07-01 ENCOUNTER — Inpatient Hospital Stay: Payer: Medicare HMO | Attending: Oncology

## 2022-07-21 ENCOUNTER — Inpatient Hospital Stay: Payer: Medicare HMO

## 2022-07-21 ENCOUNTER — Inpatient Hospital Stay: Payer: Medicare HMO | Admitting: Nurse Practitioner

## 2022-07-25 ENCOUNTER — Inpatient Hospital Stay: Payer: Medicare HMO | Attending: Oncology

## 2022-07-25 ENCOUNTER — Inpatient Hospital Stay (HOSPITAL_BASED_OUTPATIENT_CLINIC_OR_DEPARTMENT_OTHER): Payer: Medicare HMO | Admitting: Nurse Practitioner

## 2022-07-25 ENCOUNTER — Inpatient Hospital Stay: Payer: Medicare HMO

## 2022-07-25 VITALS — BP 136/61 | HR 60 | Temp 97.7°F | Resp 16 | Wt 150.0 lb

## 2022-07-25 DIAGNOSIS — D509 Iron deficiency anemia, unspecified: Secondary | ICD-10-CM | POA: Diagnosis not present

## 2022-07-25 DIAGNOSIS — N184 Chronic kidney disease, stage 4 (severe): Secondary | ICD-10-CM

## 2022-07-25 DIAGNOSIS — D631 Anemia in chronic kidney disease: Secondary | ICD-10-CM | POA: Insufficient documentation

## 2022-07-25 DIAGNOSIS — D649 Anemia, unspecified: Secondary | ICD-10-CM | POA: Diagnosis not present

## 2022-07-25 DIAGNOSIS — Z79899 Other long term (current) drug therapy: Secondary | ICD-10-CM

## 2022-07-25 LAB — CBC WITH DIFFERENTIAL/PLATELET
Abs Immature Granulocytes: 0.05 10*3/uL (ref 0.00–0.07)
Basophils Absolute: 0.1 10*3/uL (ref 0.0–0.1)
Basophils Relative: 1 %
Eosinophils Absolute: 0.2 10*3/uL (ref 0.0–0.5)
Eosinophils Relative: 2 %
HCT: 28.5 % — ABNORMAL LOW (ref 39.0–52.0)
Hemoglobin: 9.4 g/dL — ABNORMAL LOW (ref 13.0–17.0)
Immature Granulocytes: 1 %
Lymphocytes Relative: 13 %
Lymphs Abs: 1.1 10*3/uL (ref 0.7–4.0)
MCH: 30.4 pg (ref 26.0–34.0)
MCHC: 33 g/dL (ref 30.0–36.0)
MCV: 92.2 fL (ref 80.0–100.0)
Monocytes Absolute: 0.6 10*3/uL (ref 0.1–1.0)
Monocytes Relative: 7 %
Neutro Abs: 6.6 10*3/uL (ref 1.7–7.7)
Neutrophils Relative %: 76 %
Platelets: 166 10*3/uL (ref 150–400)
RBC: 3.09 MIL/uL — ABNORMAL LOW (ref 4.22–5.81)
RDW: 16.7 % — ABNORMAL HIGH (ref 11.5–15.5)
WBC: 8.7 10*3/uL (ref 4.0–10.5)
nRBC: 0 % (ref 0.0–0.2)

## 2022-07-25 LAB — IRON AND TIBC
Iron: 45 ug/dL (ref 45–182)
Saturation Ratios: 22 % (ref 17.9–39.5)
TIBC: 206 ug/dL — ABNORMAL LOW (ref 250–450)
UIBC: 161 ug/dL

## 2022-07-25 LAB — FERRITIN: Ferritin: 995 ng/mL — ABNORMAL HIGH (ref 24–336)

## 2022-07-25 MED ORDER — EPOETIN ALFA-EPBX 40000 UNIT/ML IJ SOLN
40000.0000 [IU] | Freq: Once | INTRAMUSCULAR | Status: AC
  Administered 2022-07-25: 40000 [IU] via SUBCUTANEOUS
  Filled 2022-07-25: qty 1

## 2022-07-25 MED ORDER — EPOETIN ALFA-EPBX 20000 UNIT/ML IJ SOLN: 50000.0000 [IU] | INTRAMUSCULAR | Status: DC

## 2022-07-25 MED ORDER — EPOETIN ALFA-EPBX 10000 UNIT/ML IJ SOLN
10000.0000 [IU] | Freq: Once | INTRAMUSCULAR | Status: AC
  Administered 2022-07-25: 10000 [IU] via SUBCUTANEOUS
  Filled 2022-07-25: qty 1

## 2022-07-25 NOTE — Progress Notes (Signed)
Returns for follow-up and retacrit injection. He has no new concerns at this time.

## 2022-07-25 NOTE — Progress Notes (Signed)
Hematology/Oncology Consult Note [facility]  [phone] [phone]  Patient Care Team: Albina Billet, MD as PCP - General (Internal Medicine) Sindy Guadeloupe, MD as Consulting Physician (Hematology and Oncology)   Name of the patient: Devin Washington  [id number]  1923/06/04   Date of visit: 07/25/22  Diagnosis- anemia likely multifactorial secondary to iron deficiency and chronic kidney disease   Chief complaint/ Reason for visit-routine follow-up of anemia of chronic kidney disease  Heme/Onc history: patient is a 88 year old male with a past medical history significant for atrial fibrillation, CHF, hypertension and prostate cancer who presented to the ER after he suffered a fall.  On admission he was found to have H&H of 7.1/22.2 with an MCV of 80.1 and a platelet count of 268.  BMP showed an elevated creatinine of 1.9.  TSH was normal at 0.52.  Patient received 1 unit of PRBC in the ER.  Ferritin checked per day later was 152.  Iron studies revealed low serum iron of 27, low TIBC of 229 and low iron saturation of 12%.  B12 levels were low normal at 264.  Folate level was normal at 17.5.  Reticulocyte count was low at 2% for the degree of anemia.    Coombs test was negative and haptoglobin was normal.  LDH was mildly elevated at 228.  Myeloma panel showed M protein of IgG kappa 0.5 g.  Both kappa and lambda free light chains were elevated with a normal free light chain ratio 0.77.  E Po level was 22.3   Patient underwent EGD and colonoscopy on 04/04/2018.  EGD showed nonobstructing nonbleeding superficial duodenal ulcers with a clean ulcer base.  There are nonbleeding erosions in the gastric antrum and no stigmata of recent bleeding.  Duodenum were suspicious for H. pylori.  Colonoscopy showed hemorrhoids and 7 mm cecal polyp  Interval history- Patient continues to do well. He is 88, almost 88! lives at home alone and remains indepdent of his ADLs, some  assistance with IADLs. Denies complaints other than being hungry and excited to go eat breakfast with his granddaughter. No interval blood clots. He continues to see nephrology. Feels good.   ECOG PS- 2 Pain scale- 0  Review of systems- Review of Systems  Constitutional:  Negative for chills, fever, malaise/fatigue and weight loss.  HENT:  Negative for congestion, ear discharge and nosebleeds.   Eyes:  Negative for blurred vision.  Respiratory:  Negative for cough, hemoptysis, sputum production, shortness of breath and wheezing.   Cardiovascular:  Negative for chest pain, palpitations, orthopnea and claudication.  Gastrointestinal:  Negative for abdominal pain, blood in stool, constipation, diarrhea, heartburn, melena, nausea and vomiting.  Genitourinary:  Negative for dysuria, flank pain, frequency, hematuria and urgency.  Musculoskeletal:  Negative for back pain, joint pain and myalgias.  Skin:  Negative for rash.  Neurological:  Negative for dizziness, tingling, focal weakness, seizures, weakness and headaches.  Endo/Heme/Allergies:  Does not bruise/bleed easily.  Psychiatric/Behavioral:  Negative for depression and suicidal ideas. The patient does not have insomnia.     No Known Allergies  Past Medical History:  Diagnosis Date   A-fib ([place])    Anemia    Asthma    Cardiomyopathy ([place])    CHF (congestive heart failure) (HCC)    DJD (degenerative joint disease)    Duodenal ulcer 08/09/2015   Dysrhythmia    Erosive esophagitis    HH (hiatus hernia)    HLD (hyperlipidemia)    Hypertension  Pacemaker    PAD (peripheral artery disease) ([place])    Peripheral vascular disease (HCC)    Presence of permanent cardiac pacemaker    Prostate cancer [facility])    Prostatectomy.   Rectal varices 08/09/2015   Shortness of breath    SSS (sick sinus syndrome) [facility])      Past Surgical History:  Procedure Laterality Date   CATARACT EXTRACTION, BILATERAL     COLONOSCOPY N/A 04/04/2018    Procedure: COLONOSCOPY;  Surgeon: Lin Landsman, MD;  Location: [facility] ENDOSCOPY;  Service: Gastroenterology;  Laterality: N/A;   ESOPHAGOGASTRODUODENOSCOPY N/A 08/09/2015   Procedure: ESOPHAGOGASTRODUODENOSCOPY (EGD) looking in the stomach with a lighted tube to evaluate and treat;  Surgeon: Lollie Sails, MD;  Location: [facility] ENDOSCOPY;  Service: Endoscopy;  Laterality: N/A;  Procedure to be done tomorrow afternoon, 08/08/2015. Awaiting cardiology consult   ESOPHAGOGASTRODUODENOSCOPY N/A 04/04/2018   Procedure: ESOPHAGOGASTRODUODENOSCOPY (EGD);  Surgeon: Lin Landsman, MD;  Location: [facility] ENDOSCOPY;  Service: Gastroenterology;  Laterality: N/A;   ESOPHAGOGASTRODUODENOSCOPY (EGD) WITH PROPOFOL N/A 01/25/2021   Procedure: ESOPHAGOGASTRODUODENOSCOPY (EGD) WITH PROPOFOL;  Surgeon: Lesly Rubenstein, MD;  Location: [facility] ENDOSCOPY;  Service: Gastroenterology;  Laterality: N/A;   EYE SURGERY     FEMORAL-POPLITEAL BYPASS GRAFT Left 12/16/2017   Procedure: BYPASS GRAFT FEMORAL-POPLITEAL ARTERY ( OPEN POPLITEAL BYPASS );  Surgeon: Algernon Huxley, MD;  Location: [facility] ORS;  Service: Vascular;  Laterality: Left;   FLEXIBLE SIGMOIDOSCOPY N/A 08/09/2015   Procedure: FLEXIBLE SIGMOIDOSCOPY looking up the rectum into the distal colon with a lighted tube to examine and treat;  Surgeon: Lollie Sails, MD;  Location: [facility] ENDOSCOPY;  Service: Endoscopy;  Laterality: N/A;  Procedure to be done tomorrow afternoon, 08/08/2015. Awaiting cardiology consult.   INSERT / REPLACE / REMOVE PACEMAKER     JOINT REPLACEMENT Left    left knee replacement   LOWER EXTREMITY ANGIOGRAPHY Left 11/16/2017   Procedure: LOWER EXTREMITY ANGIOGRAPHY;  Surgeon: Algernon Huxley, MD;  Location: [place] CV LAB;  Service: Cardiovascular;  Laterality: Left;   LOWER EXTREMITY ANGIOGRAPHY Right 11/30/2017   Procedure: LOWER EXTREMITY ANGIOGRAPHY;  Surgeon: Algernon Huxley, MD;  Location: [place] CV LAB;  Service: Cardiovascular;   Laterality: Right;   LOWER EXTREMITY ANGIOGRAPHY Left 03/04/2018   Procedure: LOWER EXTREMITY ANGIOGRAPHY;  Surgeon: Algernon Huxley, MD;  Location: [place] CV LAB;  Service: Cardiovascular;  Laterality: Left;   LOWER EXTREMITY ANGIOGRAPHY Left 04/12/2018   Procedure: LOWER EXTREMITY ANGIOGRAPHY;  Surgeon: Algernon Huxley, MD;  Location: [place] CV LAB;  Service: Cardiovascular;  Laterality: Left;   PACEMAKER INSERTION     PPM GENERATOR CHANGEOUT N/A 04/10/2021   Procedure: PPM GENERATOR CHANGEOUT;  Surgeon: [name], MD;  Location: [place] CV LAB;  Service: Cardiovascular;  Laterality: N/A;   PROSTATE SURGERY     PROSTATE SURGERY      Social History   Socioeconomic History   Marital status: Widowed    Spouse name: Not on file   Number of children: Not on file   Years of education: Not on file   Highest education level: Not on file  Occupational History    Employer: RETIRED/[employer]  Tobacco Use   Smoking status: Former   Smokeless tobacco: Former    Types: Chew, Snuff    Quit date: 1972  Vaping Use   Vaping Use: Never used  Substance and Sexual Activity   Alcohol use: No    Alcohol/week: 0.0 standard drinks  of alcohol   Drug use: No   Sexual activity: Not Currently  Other Topics Concern   Not on file  Social History Narrative   Not on file   Social Determinants of Health   Financial Resource Strain: Not on file  Food Insecurity: Not on file  Transportation Needs: Not on file  Physical Activity: Not on file  Stress: Not on file  Social Connections: Not on file  Intimate Partner Violence: Not on file    Family History  Problem Relation Age of Onset   Lung cancer Father      Current Outpatient Medications:    amLODipine (NORVASC) 10 MG tablet, Take 10 mg by mouth daily., Disp: , Rfl:    ANORO ELLIPTA 62.5-25 MCG/INH AEPB, Inhale 1 puff into the lungs 2 (two) times daily as needed (SOB/ Wheezing)., Disp: , Rfl:    aspirin EC 81  MG EC tablet, Take 1 tablet (81 mg total) by mouth daily. Swallow whole., Disp: 30 tablet, Rfl: 11   benazepril (LOTENSIN) 20 MG tablet, Take 20 mg by mouth daily., Disp: , Rfl:    feeding supplement, ENSURE ENLIVE, (ENSURE ENLIVE) LIQD, Take 1 Bottle by mouth 2 (two) times a week., Disp: , Rfl:    ferrous sulfate 325 (65 FE) MG tablet, Take 1 tablet (325 mg total) by mouth 3 (three) times daily with meals. (Patient taking differently: Take 325 mg by mouth daily with breakfast.), Disp: 90 tablet, Rfl: 1   furosemide (LASIX) 20 MG tablet, Take 20 mg by mouth daily., Disp: , Rfl:    metoprolol tartrate (LOPRESSOR) 25 MG tablet, Take 25 mg by mouth 2 (two) times daily., Disp: , Rfl:    pantoprazole (PROTONIX) 40 MG tablet, Take 1 tablet (40 mg total) by mouth 2 (two) times daily before a meal., Disp: 60 tablet, Rfl: 1   simvastatin (ZOCOR) 40 MG tablet, Take 40 mg by mouth daily., Disp: , Rfl:    traMADol (ULTRAM) 50 MG tablet, Take 1 tablet (50 mg total) by mouth every 6 (six) hours as needed for moderate pain or severe pain., Disp: 30 tablet, Rfl: 0   traZODone (DESYREL) 50 MG tablet, Take 50 mg by mouth at bedtime., Disp: , Rfl:    vitamin B-12 (CYANOCOBALAMIN) 100 MCG tablet, Take 100 mcg by mouth daily., Disp: , Rfl:  No current facility-administered medications for this visit.  Facility-Administered Medications Ordered in Other Visits:    epoetin alfa-epbx (RETACRIT) injection 10,000 Units, 10,000 Units, Subcutaneous, Once, Sindy Guadeloupe, MD   epoetin alfa-epbx (RETACRIT) injection 40,000 Units, 40,000 Units, Subcutaneous, Once, Sindy Guadeloupe, MD  Physical exam:  Vitals:   07/25/22 0926  BP: 136/61  Pulse: 60  Resp: 16  Temp: 97.7 F (36.5 C)  TempSrc: Oral  SpO2: 100%  Weight: 150 lb (68 kg)   Physical Exam Vitals reviewed.  Constitutional:      Appearance: He is not ill-appearing.     Comments: wheelchair  Pulmonary:     Effort: No respiratory distress.  Skin:    General:  Skin is dry.     Coloration: Skin is not pale.  Neurological:     Mental Status: He is alert and oriented to person, place, and time.  Psychiatric:        Mood and Affect: Mood normal.        Behavior: Behavior normal.        Latest Ref Rng & Units 07/25/2022    9:04 AM  CBC  WBC 4.0 - 10.5 K/uL 8.7   Hemoglobin 13.0 - 17.0 g/dL 9.4   Hematocrit 39.0 - 52.0 % 28.5   Platelets 150 - 400 K/uL 166    Iron/TIBC/Ferritin/ %Sat    Component Value Date/Time   IRON 36 (L) 04/29/2022 0940   TIBC 225 (L) 04/29/2022 0940   FERRITIN 574 (H) 04/29/2022 0940   IRONPCTSAT 16 (L) 04/29/2022 0940    Assessment and plan- Patient is a 88 y.o. male with   Anemia of Chronic Kidney Disease - gfr is stable at 24. Stage 4 CKD. He receives retacrit for hemoglobin < 10. Hemoglobin 9.4 today. Proceed with retacrit. Overall tolerating well. Continue retacrit every 3 weeks to maintain hemoglobin 10 or greater. Iron Deficiency Anemia- Iron studies pending at time of visit. He has not required IV iron/feraheme since May 2019. Plan to check reticulocyte hemoglobin at next visit.  Symptomatic Anemia- occasional drops in hemoglobin with intermittent requirement for blood transfusion. No indication for transfusion today. Monitor.   Disposition Retacrit today 3, 6, 9 weeks- lab (H&H), +/- retacrit 12 weeks- lab (cbc, ferritin, iron studies, retic panel), +/- retacrit 15, 18, 21 weeks- lab (H&H), +/- retacrit 24 weeks- lab (cbc, ferritin, iron studies, retic panel), Dr. Janese Banks, +/- retacrit- la   Visit Diagnosis 1. Iron deficiency anemia, unspecified iron deficiency anemia type   2. Anemia in stage 4 chronic kidney disease ([place])   3. Erythropoietin (EPO) stimulating agent anemia management patient    Beckey Rutter, [place], AGNP-C [place] at [facility] [phone] (clinic) 07/25/2022

## 2022-08-14 ENCOUNTER — Other Ambulatory Visit: Payer: Self-pay

## 2022-08-14 DIAGNOSIS — D509 Iron deficiency anemia, unspecified: Secondary | ICD-10-CM

## 2022-08-15 ENCOUNTER — Inpatient Hospital Stay: Payer: Medicare HMO

## 2022-08-15 ENCOUNTER — Inpatient Hospital Stay: Payer: Medicare HMO | Attending: Oncology

## 2022-09-03 ENCOUNTER — Other Ambulatory Visit: Payer: Self-pay | Admitting: *Deleted

## 2022-09-03 DIAGNOSIS — D631 Anemia in chronic kidney disease: Secondary | ICD-10-CM

## 2022-09-05 ENCOUNTER — Inpatient Hospital Stay: Payer: Medicare HMO

## 2022-09-26 ENCOUNTER — Inpatient Hospital Stay: Payer: Medicare HMO

## 2022-09-26 ENCOUNTER — Inpatient Hospital Stay: Payer: Medicare HMO | Attending: Oncology

## 2022-10-13 ENCOUNTER — Encounter (INDEPENDENT_AMBULATORY_CARE_PROVIDER_SITE_OTHER): Payer: Self-pay

## 2022-10-16 ENCOUNTER — Other Ambulatory Visit: Payer: Self-pay | Admitting: *Deleted

## 2022-10-16 DIAGNOSIS — D509 Iron deficiency anemia, unspecified: Secondary | ICD-10-CM

## 2022-10-16 DIAGNOSIS — D631 Anemia in chronic kidney disease: Secondary | ICD-10-CM

## 2022-10-17 ENCOUNTER — Inpatient Hospital Stay: Payer: Medicare HMO

## 2022-10-17 ENCOUNTER — Inpatient Hospital Stay: Payer: Medicare HMO | Attending: Oncology

## 2022-10-29 ENCOUNTER — Encounter: Payer: Self-pay | Admitting: Oncology

## 2022-11-10 ENCOUNTER — Inpatient Hospital Stay: Payer: Medicare HMO

## 2022-11-28 ENCOUNTER — Inpatient Hospital Stay: Payer: Medicare HMO

## 2022-11-28 ENCOUNTER — Inpatient Hospital Stay: Payer: Medicare HMO | Attending: Oncology

## 2022-12-19 ENCOUNTER — Inpatient Hospital Stay: Payer: Medicare HMO

## 2022-12-19 ENCOUNTER — Inpatient Hospital Stay: Payer: Medicare HMO | Attending: Oncology

## 2022-12-21 ENCOUNTER — Encounter: Payer: Self-pay | Admitting: Internal Medicine

## 2022-12-21 ENCOUNTER — Emergency Department: Payer: Medicare HMO

## 2022-12-21 ENCOUNTER — Inpatient Hospital Stay
Admission: EM | Admit: 2022-12-21 | Discharge: 2023-01-03 | DRG: 871 | Disposition: A | Discharge status: Hospice | Payer: Medicare HMO | Attending: Student | Admitting: Student

## 2022-12-21 DIAGNOSIS — R6520 Severe sepsis without septic shock: Secondary | ICD-10-CM

## 2022-12-21 DIAGNOSIS — Z950 Presence of cardiac pacemaker: Secondary | ICD-10-CM | POA: Diagnosis present

## 2022-12-21 DIAGNOSIS — N1832 Chronic kidney disease, stage 3b: Secondary | ICD-10-CM | POA: Diagnosis present

## 2022-12-21 DIAGNOSIS — I495 Sick sinus syndrome: Secondary | ICD-10-CM | POA: Diagnosis present

## 2022-12-21 DIAGNOSIS — N179 Acute kidney failure, unspecified: Secondary | ICD-10-CM

## 2022-12-21 DIAGNOSIS — A419 Sepsis, unspecified organism: Secondary | ICD-10-CM | POA: Diagnosis not present

## 2022-12-21 DIAGNOSIS — I5033 Acute on chronic diastolic (congestive) heart failure: Secondary | ICD-10-CM | POA: Diagnosis present

## 2022-12-21 DIAGNOSIS — J9601 Acute respiratory failure with hypoxia: Secondary | ICD-10-CM

## 2022-12-21 DIAGNOSIS — D509 Iron deficiency anemia, unspecified: Secondary | ICD-10-CM | POA: Diagnosis present

## 2022-12-21 DIAGNOSIS — I480 Paroxysmal atrial fibrillation: Secondary | ICD-10-CM | POA: Diagnosis present

## 2022-12-21 DIAGNOSIS — E785 Hyperlipidemia, unspecified: Secondary | ICD-10-CM | POA: Diagnosis present

## 2022-12-21 DIAGNOSIS — I350 Nonrheumatic aortic (valve) stenosis: Secondary | ICD-10-CM | POA: Diagnosis present

## 2022-12-21 DIAGNOSIS — J441 Chronic obstructive pulmonary disease with (acute) exacerbation: Secondary | ICD-10-CM | POA: Diagnosis present

## 2022-12-21 DIAGNOSIS — I130 Hypertensive heart and chronic kidney disease with heart failure and stage 1 through stage 4 chronic kidney disease, or unspecified chronic kidney disease: Secondary | ICD-10-CM | POA: Diagnosis present

## 2022-12-21 DIAGNOSIS — N184 Chronic kidney disease, stage 4 (severe): Secondary | ICD-10-CM | POA: Diagnosis present

## 2022-12-21 DIAGNOSIS — Z79899 Other long term (current) drug therapy: Secondary | ICD-10-CM

## 2022-12-21 DIAGNOSIS — E872 Acidosis, unspecified: Secondary | ICD-10-CM | POA: Diagnosis present

## 2022-12-21 DIAGNOSIS — I724 Aneurysm of artery of lower extremity: Secondary | ICD-10-CM | POA: Diagnosis present

## 2022-12-21 DIAGNOSIS — R7989 Other specified abnormal findings of blood chemistry: Secondary | ICD-10-CM | POA: Diagnosis not present

## 2022-12-21 DIAGNOSIS — Z87891 Personal history of nicotine dependence: Secondary | ICD-10-CM

## 2022-12-21 DIAGNOSIS — Z602 Problems related to living alone: Secondary | ICD-10-CM | POA: Diagnosis present

## 2022-12-21 DIAGNOSIS — Z515 Encounter for palliative care: Secondary | ICD-10-CM | POA: Diagnosis not present

## 2022-12-21 DIAGNOSIS — E1151 Type 2 diabetes mellitus with diabetic peripheral angiopathy without gangrene: Secondary | ICD-10-CM | POA: Diagnosis present

## 2022-12-21 DIAGNOSIS — J189 Pneumonia, unspecified organism: Secondary | ICD-10-CM | POA: Clinically undetermined

## 2022-12-21 DIAGNOSIS — G9341 Metabolic encephalopathy: Secondary | ICD-10-CM | POA: Diagnosis present

## 2022-12-21 DIAGNOSIS — I82441 Acute embolism and thrombosis of right tibial vein: Secondary | ICD-10-CM | POA: Diagnosis present

## 2022-12-21 DIAGNOSIS — R6521 Severe sepsis with septic shock: Secondary | ICD-10-CM | POA: Diagnosis present

## 2022-12-21 DIAGNOSIS — Z66 Do not resuscitate: Secondary | ICD-10-CM | POA: Diagnosis present

## 2022-12-21 DIAGNOSIS — I428 Other cardiomyopathies: Secondary | ICD-10-CM | POA: Diagnosis present

## 2022-12-21 DIAGNOSIS — I2489 Other forms of acute ischemic heart disease: Secondary | ICD-10-CM | POA: Diagnosis not present

## 2022-12-21 DIAGNOSIS — I714 Abdominal aortic aneurysm, without rupture, unspecified: Secondary | ICD-10-CM | POA: Diagnosis present

## 2022-12-21 DIAGNOSIS — E861 Hypovolemia: Secondary | ICD-10-CM | POA: Diagnosis present

## 2022-12-21 DIAGNOSIS — I21A1 Myocardial infarction type 2: Secondary | ICD-10-CM | POA: Diagnosis present

## 2022-12-21 DIAGNOSIS — D631 Anemia in chronic kidney disease: Secondary | ICD-10-CM | POA: Diagnosis present

## 2022-12-21 DIAGNOSIS — Z7982 Long term (current) use of aspirin: Secondary | ICD-10-CM

## 2022-12-21 DIAGNOSIS — Z1152 Encounter for screening for COVID-19: Secondary | ICD-10-CM

## 2022-12-21 DIAGNOSIS — F0390 Unspecified dementia without behavioral disturbance: Secondary | ICD-10-CM | POA: Diagnosis present

## 2022-12-21 DIAGNOSIS — E1122 Type 2 diabetes mellitus with diabetic chronic kidney disease: Secondary | ICD-10-CM | POA: Diagnosis present

## 2022-12-21 DIAGNOSIS — Z7189 Other specified counseling: Secondary | ICD-10-CM | POA: Diagnosis not present

## 2022-12-21 DIAGNOSIS — K219 Gastro-esophageal reflux disease without esophagitis: Secondary | ICD-10-CM | POA: Diagnosis present

## 2022-12-21 DIAGNOSIS — Z7901 Long term (current) use of anticoagulants: Secondary | ICD-10-CM

## 2022-12-21 LAB — COMPREHENSIVE METABOLIC PANEL
ALT: 17 U/L (ref 0–44)
AST: 38 U/L (ref 15–41)
Albumin: 2.8 g/dL — ABNORMAL LOW (ref 3.5–5.0)
Alkaline Phosphatase: 68 U/L (ref 38–126)
Anion gap: 20 — ABNORMAL HIGH (ref 5–15)
BUN: 62 mg/dL — ABNORMAL HIGH (ref 8–23)
CO2: 10 mmol/L — ABNORMAL LOW (ref 22–32)
Calcium: 8.6 mg/dL — ABNORMAL LOW (ref 8.9–10.3)
Chloride: 104 mmol/L (ref 98–111)
Creatinine, Ser: 3.75 mg/dL — ABNORMAL HIGH (ref 0.61–1.24)
GFR, Estimated: 14 mL/min — ABNORMAL LOW (ref >60)
Glucose, Bld: 196 mg/dL — ABNORMAL HIGH (ref 70–99)
Potassium: 4.6 mmol/L (ref 3.5–5.1)
Sodium: 134 mmol/L — ABNORMAL LOW (ref 135–145)
Total Bilirubin: 1.2 mg/dL (ref 0.3–1.2)
Total Protein: 6.3 g/dL — ABNORMAL LOW (ref 6.5–8.1)

## 2022-12-21 LAB — CBC WITH DIFFERENTIAL/PLATELET
Abs Immature Granulocytes: 0.26 10*3/uL — ABNORMAL HIGH (ref 0.00–0.07)
Basophils Absolute: 0 10*3/uL (ref 0.0–0.1)
Basophils Relative: 0 %
Eosinophils Absolute: 0 10*3/uL (ref 0.0–0.5)
Eosinophils Relative: 0 %
HCT: 29 % — ABNORMAL LOW (ref 39.0–52.0)
Hemoglobin: 8.9 g/dL — ABNORMAL LOW (ref 13.0–17.0)
Immature Granulocytes: 2 %
Lymphocytes Relative: 8 %
Lymphs Abs: 1.3 10*3/uL (ref 0.7–4.0)
MCH: 29.2 pg (ref 26.0–34.0)
MCHC: 30.7 g/dL (ref 30.0–36.0)
MCV: 95.1 fL (ref 80.0–100.0)
Monocytes Absolute: 0.4 10*3/uL (ref 0.1–1.0)
Monocytes Relative: 2 %
Neutro Abs: 14.8 10*3/uL — ABNORMAL HIGH (ref 1.7–7.7)
Neutrophils Relative %: 88 %
Platelets: 279 10*3/uL (ref 150–400)
RBC: 3.05 MIL/uL — ABNORMAL LOW (ref 4.22–5.81)
RDW: 15.4 % (ref 11.5–15.5)
WBC: 16.8 10*3/uL — ABNORMAL HIGH (ref 4.0–10.5)
nRBC: 0 % (ref 0.0–0.2)

## 2022-12-21 LAB — RESP PANEL BY RT-PCR (RSV, FLU A&B, COVID)  RVPGX2
Influenza A by PCR: NEGATIVE
Influenza B by PCR: NEGATIVE
Resp Syncytial Virus by PCR: NEGATIVE
SARS Coronavirus 2 by RT PCR: NEGATIVE

## 2022-12-21 LAB — PROTIME-INR
INR: 1.3 — ABNORMAL HIGH (ref 0.8–1.2)
Prothrombin Time: 16.1 seconds — ABNORMAL HIGH (ref 11.4–15.2)

## 2022-12-21 LAB — LACTIC ACID, PLASMA: Lactic Acid, Venous: >9 mmol/L (ref 0.5–1.9)

## 2022-12-21 LAB — BLOOD GAS, VENOUS
Acid-base deficit: 15.2 mmol/L — ABNORMAL HIGH (ref 0.0–2.0)
Bicarbonate: 11.9 mmol/L — ABNORMAL LOW (ref 20.0–28.0)
O2 Saturation: 24.7 %
Patient temperature: 37
pCO2, Ven: 32 mmHg — ABNORMAL LOW (ref 44–60)
pH, Ven: 7.18 — CL (ref 7.25–7.43)
pO2, Ven: 31 mmHg — CL (ref 32–45)

## 2022-12-21 LAB — APTT: aPTT: 34 seconds (ref 24–36)

## 2022-12-21 LAB — PROCALCITONIN: Procalcitonin: 15.94 ng/mL

## 2022-12-21 MED ORDER — SODIUM CHLORIDE 0.9 % IV BOLUS (SEPSIS)
1500.0000 mL | Freq: Once | INTRAVENOUS | Status: AC
  Administered 2022-12-21: 1500 mL via INTRAVENOUS

## 2022-12-21 MED ORDER — SODIUM CHLORIDE 0.9 % IV BOLUS
500.0000 mL | Freq: Once | INTRAVENOUS | Status: AC
  Administered 2022-12-21: 500 mL via INTRAVENOUS

## 2022-12-21 MED ORDER — SODIUM CHLORIDE 0.9 % IV SOLN
500.0000 mg | INTRAVENOUS | Status: DC
  Administered 2022-12-21: 500 mg via INTRAVENOUS
  Filled 2022-12-21 (×2): qty 5

## 2022-12-21 MED ORDER — SODIUM CHLORIDE 0.9 % IV SOLN
2.0000 g | INTRAVENOUS | Status: DC
  Administered 2022-12-21: 2 g via INTRAVENOUS
  Filled 2022-12-21: qty 20

## 2022-12-21 NOTE — Progress Notes (Signed)
CODE SEPSIS - PHARMACY COMMUNICATION  **Broad Spectrum Antibiotics should be administered within 1 hour of Sepsis diagnosis**  Time Code Sepsis Called/Page Received: 1856  Antibiotics Ordered: ceftriaxone and azithromycin  Time of 1st antibiotic administration: 1901  Additional action taken by pharmacy: N/A  Lorin Picket ,PharmD Clinical Pharmacist  12/21/2022  6:55 PM 1

## 2022-12-21 NOTE — H&P (Signed)
History and Physical    Patient: Devin Washington WGN:[id number] DOB: 1923-04-08 DOA: 12/21/2022 DOS: the patient was seen and examined on 12/21/2022 PCP: Pcp, No  Patient coming from: Home  Chief Complaint:  Chief Complaint  Patient presents with   Respiratory Distress   HPI: Devin Washington is a 88 y.o. male with medical history significant of atrial fibrillation, essential hypertension, chronic kidney disease stage IIIb, iron deficiency anemia, sick sinus syndrome status post pacemaker placement, hyperlipidemia, essential hypertension, chronic diastolic heart failure who was initially brought into the ER as a Devin Washington.  Patient was obtained there was significant hypoxic respiratory failure.  He is currently unable to give any history and is on BiPAP.  Patient arrived with oxygen sat of 62% on room air.  He was in respiratory distress on CPAP from EMS.  Workup so far showed severe sepsis with lactic acid of more than 9 and pH of 7.18.  Vitals are however stable.  Patient has chest x-ray that is negative.  Acute viral screen also negative.  No obvious source of sepsis.  At this point he is being admitted with acute hypoxic respiratory failure as well as severe sepsis of unknown source.  Workup is ongoing to determine the source.  Review of Systems: As mentioned in the history of present illness. All other systems reviewed and are negative. History reviewed. No pertinent past medical history. History reviewed. No pertinent surgical history. Social History:  has no history on file for tobacco use, alcohol use, and drug use.  No Known Allergies  History reviewed. No pertinent family history.  Prior to Admission medications   Medication Sig Start Date End Date Taking? Authorizing Provider  amLODipine (NORVASC) 10 MG tablet Take 10 mg by mouth in the morning. 01/01/21  Yes [provider]  aspirin EC (ASPIR-LOW) 81 MG tablet 81 mg daily. 05/22/21  Yes [provider]  benazepril  (LOTENSIN) 20 MG tablet Take 20 mg by mouth in the morning. 03/13/21  Yes [provider]  cyanocobalamin 1000 MCG tablet Take 1,000 mcg by mouth daily. 03/13/21  Yes [provider]  ferrous sulfate 325 (65 FE) MG EC tablet Take 325 mg by mouth 2 (two) times daily. 03/13/21  Yes [provider]  furosemide (LASIX) 40 MG tablet Take 40 mg by mouth in the morning. 05/22/21  Yes [provider]  metoprolol tartrate (LOPRESSOR) 25 MG tablet Take 25 mg by mouth 2 (two) times daily. 02/03/21  Yes [provider]  pantoprazole (PROTONIX) 40 MG tablet Take 40 mg by mouth 2 (two) times daily. 01/28/21  Yes [provider]  simvastatin (ZOCOR) 40 MG tablet Take 40 mg by mouth at bedtime. 02/03/21  Yes [provider]  traMADol (ULTRAM) 50 MG tablet Take 50 mg by mouth 3 (three) times daily as needed. 12/16/22  Yes [provider]  traZODone (DESYREL) 50 MG tablet Take 50 mg by mouth at bedtime. 02/03/21  Yes [provider]    Physical Exam: Vitals:   12/21/22 2145 12/21/22 2238 12/21/22 2239 12/21/22 2245  BP: (!) 108/52 125/61    Pulse: 66 61    Resp: 17 (!) 21    Temp:   (!) 97.4 F (36.3 C)   TempSrc:   Axillary   SpO2: 100% 100%  100%   Constitutional: Obtunded, in obvious respiratory distress on BiPAP  Eyes: PERRL, lids and conjunctivae normal ENMT: Mucous membranes are dry. Posterior pharynx clear of any exudate or lesions.Normal  dentition.  Neck: normal, supple, no masses, no thyromegaly Respiratory: Decreased air entry bilaterally, obvious respiratory distress on BiPAP Cardiovascular: Regular rate and rhythm, no murmurs / rubs / gallops. No extremity edema. 2+ pedal pulses. No carotid bruits.  Abdomen: no tenderness, no masses palpated. No hepatosplenomegaly. Bowel sounds positive.  Musculoskeletal: Good range of motion, no joint swelling or tenderness, Skin: no rashes, lesions, ulcers. No induration Neurologic: CN  2-12 grossly intact. Sensation intact, DTR normal. Strength 5/5 in all 4.  Psychiatric: Obtunded  Data Reviewed:  Temperature 97.4, blood pressure 108/52, pulse 66, respiratory 17, O2 sat 57% on room air, pH 7.18, pCO2 of 32, sodium 134 potassium 4.3 chloride 104, CO2 of 10 glucose 196 and BUN 62 creatinine 3.75.  Gap of 20.  Albumin 2.8 with calcium 8.6.  Lactic acid is more than 9 and procalcitonin of 15.9.  White count is 16.8 hemoglobin 8.9 and platelets 279.  Acute viral screen so far negative for flu RSV and COVID.  Chest x-ray showed no acute findings.  CT chest abdomen pelvis pending.  Urinalysis pending.  Assessment and Plan:  #1 severe sepsis with endorgan damage: Patient severely hypoxic although no evidence of pneumonia on chest x-ray.  CT ordered and will follow results CT may show something.  Urinalysis also ordered.  In the meantime initiate treatment for severe sepsis.  Cefepime metronidazole and vancomycin.  Pharmacy to dose.  #2 acute hypoxic respiratory failure: Currently on BiPAP.  Continue antibiotics treatment and titrate of BiPAP  #3 chronic kidney disease stage III: Hydrate.  Monitor renal function  #4 severe lactic acidosis: Aggressively hydrate.  Continue antibiotics  #5 sick sinus syndrome: Status post pacemaker placement.  Rate is now controlled.  #6 paroxysmal atrial fibrillation: Rate is controlled.  Home regimen.  #7 acute metabolic encephalopathy: Secondary to severe sepsis.     Advance Care Planning:   Code Status: DNR   Consults: None  Family Communication: No family at bedside  Severity of Illness: The appropriate patient status for this patient is INPATIENT. Inpatient status is judged to be reasonable and necessary in order to provide the required intensity of service to ensure the patient's safety. The patient's presenting symptoms, physical exam findings, and initial radiographic and laboratory data in the context of their chronic comorbidities is  felt to place them at high risk for further clinical deterioration. Furthermore, it is not anticipated that the patient will be medically stable for discharge from the hospital within 2 midnights of admission.   * I certify that at the point of admission it is my clinical judgment that the patient will require inpatient hospital care spanning beyond 2 midnights from the point of admission due to high intensity of service, high risk for further deterioration and high frequency of surveillance required.*  AuthorLonia Blood, MD 12/21/2022 11:20 PM  For on call review [email or web address].

## 2022-12-21 NOTE — ED Triage Notes (Signed)
Pt arrived via EMS from home. Pt is in resp distress on CPAP. Pt oxygen sat at this time is 62%. Provider and nursing staff at bedside at this time.

## 2022-12-21 NOTE — Sepsis Progress Note (Signed)
Elink following code sepsis °

## 2022-12-21 NOTE — ED Notes (Signed)
Bair hugger applied to pt.  

## 2022-12-21 NOTE — ED Notes (Signed)
Pt on Bipap at this time.  Ipap 14 Epap 5 Rate 10 02 65%

## 2022-12-21 NOTE — ED Provider Notes (Signed)
[facility] Provider Note    Event Date/Time   First MD Initiated Contact with Patient 12/21/22 1853     (approximate)   History   Chief Complaint: Respiratory Distress   HPI  Devin Washington is a 89 y.o. male with a history of CHF, atrial fibrillation, COPD who was brought to the ED due to respiratory distress.  EMS reports that room air oxygen saturation initially was 62%.  They placed the patient on CPAP.  On arrival patient was placed on BiPAP.  Denies any pain anywhere.  No falls, no trauma, no fever.     Physical Exam   Triage Vital Signs: ED Triage Vitals  Enc Vitals Group     BP 12/21/22 1836 107/79     Pulse Rate 12/21/22 1836 77     Resp 12/21/22 1836 (!) 24     Temp 12/21/22 1853 (!) 94 F (34.4 C)     Temp Source 12/21/22 1853 Rectal     SpO2 12/21/22 1836 (!) 64 %     Weight --      Height --      Head Circumference --      Peak Flow --      Pain Score --      Pain Loc --      Pain Edu? --      Excl. in [place]? --     Most recent vital signs: Vitals:   12/21/22 2245 12/21/22 2323  BP:    Pulse:    Resp:    Temp:  98.5 F (36.9 C)  SpO2: 100%     General: Awake, respiratory distress  CV:  Good peripheral perfusion.  Regular rate and rhythm, normal peripheral pulses Resp:  Accessory muscle use.  Tachypnea.  High tidal volumes. Abd:  No distention.  Soft nontender Other:  1+ pitting edema bilateral lower extremities.   ED Results / Procedures / Treatments   Labs (all labs ordered are listed, but only abnormal results are displayed) Labs Reviewed  BLOOD GAS, VENOUS - Abnormal; Notable for the following components:      Result Value   pH, Ven 7.18 (*)    pCO2, Ven 32 (*)    pO2, Ven 31 (*)    Bicarbonate 11.9 (*)    Acid-base deficit 15.2 (*)    All other components within normal limits  LACTIC ACID, PLASMA - Abnormal; Notable for the following components:   Lactic Acid, Venous >9.0 (*)    All other components  within normal limits  COMPREHENSIVE METABOLIC PANEL - Abnormal; Notable for the following components:   Sodium 134 (*)    CO2 10 (*)    Glucose, Bld 196 (*)    BUN 62 (*)    Creatinine, Ser 3.75 (*)    Calcium 8.6 (*)    Total Protein 6.3 (*)    Albumin 2.8 (*)    GFR, Estimated 14 (*)    Anion gap 20 (*)    All other components within normal limits  CBC WITH DIFFERENTIAL/PLATELET - Abnormal; Notable for the following components:   WBC 16.8 (*)    RBC 3.05 (*)    Hemoglobin 8.9 (*)    HCT 29.0 (*)    Neutro Abs 14.8 (*)    Abs Immature Granulocytes 0.26 (*)    All other components within normal limits  PROTIME-INR - Abnormal; Notable for the following components:   Prothrombin Time 16.1 (*)    INR 1.3 (*)  All other components within normal limits  RESP PANEL BY RT-PCR (RSV, FLU A&B, COVID)  RVPGX2  CULTURE, BLOOD (ROUTINE X 2)  CULTURE, BLOOD (ROUTINE X 2)  URINE CULTURE  APTT  PROCALCITONIN  LACTIC ACID, PLASMA  URINALYSIS, COMPLETE (UACMP) WITH MICROSCOPIC  TROPONIN I (HIGH SENSITIVITY)     EKG Interpreted by me Atrial fibrillation rate of 67.  Left axis, grossly normal intervals.  Left bundle branch block.  No acute ischemic changes.  Interpretation limited by extensive artifact and baseline wander.   RADIOLOGY Chest x-ray interpreted by me, appears unremarkable.  Radiology report reviewed.  CT chest abdomen pelvis unremarkable   PROCEDURES:  .Critical Care  Performed by: Sharman Cheek, MD Authorized by: Sharman Cheek, MD   Critical care provider statement:    Critical care time (minutes):  35   Critical care time was exclusive of:  Separately billable procedures and treating other patients   Critical care was necessary to treat or prevent imminent or life-threatening deterioration of the following conditions:  Sepsis and respiratory failure   Critical care was time spent personally by me on the following activities:  Development of treatment  plan with patient or surrogate, discussions with consultants, evaluation of patient's response to treatment, examination of patient, obtaining history from patient or surrogate, ordering and performing treatments and interventions, ordering and review of laboratory studies, ordering and review of radiographic studies, pulse oximetry, re-evaluation of patient's condition and review of old charts   Care discussed with: admitting provider      MEDICATIONS ORDERED IN ED: Medications  cefTRIAXone (ROCEPHIN) 2 g in sodium chloride 0.9 % 100 mL IVPB (0 g Intravenous Stopped 12/21/22 1931)  azithromycin (ZITHROMAX) 500 mg in sodium chloride 0.9 % 250 mL IVPB (0 mg Intravenous Stopped 12/21/22 2033)  sodium chloride 0.9 % bolus 1,500 mL (1,500 mLs Intravenous New Bag/Given 12/21/22 2323)  sodium chloride 0.9 % bolus 500 mL (0 mLs Intravenous Stopped 12/21/22 2119)     IMPRESSION / MDM / ASSESSMENT AND PLAN / ED COURSE  I reviewed the triage vital signs and the nursing notes.                              Differential diagnosis includes, but is not limited to, pulmonary edema, pleural effusion, pneumonia, pneumothorax, COPD exacerbation, sepsis, non-STEMI  Patient's presentation is most consistent with acute presentation with potential threat to life or bodily function.  Patient presents in respiratory distress, placed on BiPAP on arrival.  On BiPAP his tidal volume is greater than 1 L, for a minute ventilation of about 35 L.  Labs showed metabolic acidosis, lactate pending.  VBG shows acidemia with slightly low pCO2, not a respiratory acidosis.  He does have AKI on CKD, baseline creatinine is 2.4.  Stable chronic anemia.  Will give Rocephin and azithromycin.  CODE STATUS is DNR/DNI.  Clinical Course as of 12/21/22 2333  [name] Dec 21, 2022  2241 Lactic Acid, Venous(!!): >9.0 Lab just resulted lactate. Due to concern for CHF, was using restrictive fluid strategy. Will complete 40ml/kg bolus now. Prior weight  65kg, total . Procal elevated, c/w sepsis [PS]    Clinical Course User Index [PS] Sharman Cheek, MD    ----------------------------------------- 11:33 PM on 12/21/2022 ----------------------------------------- Case discussed with hospitalist for further management.   FINAL CLINICAL IMPRESSION(S) / ED DIAGNOSES   Final diagnoses:  Septic shock (HCC)  Acute respiratory failure with hypoxia (HCC)  AKI (acute  kidney injury) (HCC)     Rx / DC Orders   ED Discharge Orders     None        Note:  This document was prepared using Dragon voice recognition software and may include unintentional dictation errors.   Sharman Cheek, MD 12/21/22 2333

## 2022-12-22 ENCOUNTER — Other Ambulatory Visit: Payer: Self-pay

## 2022-12-22 ENCOUNTER — Inpatient Hospital Stay (HOSPITAL_COMMUNITY)
Admit: 2022-12-22 | Discharge: 2022-12-22 | Disposition: A | Discharge status: Home / Self Care | Payer: Medicare HMO | Attending: Student | Admitting: Student

## 2022-12-22 ENCOUNTER — Encounter: Payer: Self-pay | Admitting: Internal Medicine

## 2022-12-22 DIAGNOSIS — R6520 Severe sepsis without septic shock: Secondary | ICD-10-CM | POA: Diagnosis not present

## 2022-12-22 DIAGNOSIS — R7989 Other specified abnormal findings of blood chemistry: Secondary | ICD-10-CM | POA: Diagnosis not present

## 2022-12-22 DIAGNOSIS — I2489 Other forms of acute ischemic heart disease: Secondary | ICD-10-CM | POA: Diagnosis not present

## 2022-12-22 DIAGNOSIS — A419 Sepsis, unspecified organism: Secondary | ICD-10-CM | POA: Diagnosis not present

## 2022-12-22 LAB — BETA-HYDROXYBUTYRIC ACID: Beta-Hydroxybutyric Acid: 0.09 mmol/L (ref 0.05–0.27)

## 2022-12-22 LAB — ECHOCARDIOGRAM COMPLETE
AR max vel: 0.71 cm2
AV Area VTI: 0.74 cm2
AV Area mean vel: 0.74 cm2
AV Mean grad: 30.3 mmHg
AV Peak grad: 53.7 mmHg
Ao pk vel: 3.67 m/s
Area-P 1/2: 4.57 cm2
Height: 70 in
P 1/2 time: 351 msec
S' Lateral: 2.7 cm
Weight: 2432 oz

## 2022-12-22 LAB — TROPONIN I (HIGH SENSITIVITY)
Troponin I (High Sensitivity): 182 ng/L (ref <18)
Troponin I (High Sensitivity): 189 ng/L (ref <18)
Troponin I (High Sensitivity): 261 ng/L (ref <18)
Troponin I (High Sensitivity): 272 ng/L (ref <18)

## 2022-12-22 LAB — COMPREHENSIVE METABOLIC PANEL
ALT: 18 U/L (ref 0–44)
AST: 19 U/L (ref 15–41)
Albumin: 2.5 g/dL — ABNORMAL LOW (ref 3.5–5.0)
Alkaline Phosphatase: 60 U/L (ref 38–126)
Anion gap: 9 (ref 5–15)
BUN: 61 mg/dL — ABNORMAL HIGH (ref 8–23)
CO2: 18 mmol/L — ABNORMAL LOW (ref 22–32)
Calcium: 8 mg/dL — ABNORMAL LOW (ref 8.9–10.3)
Chloride: 111 mmol/L (ref 98–111)
Creatinine, Ser: 3.2 mg/dL — ABNORMAL HIGH (ref 0.61–1.24)
GFR, Estimated: 17 mL/min — ABNORMAL LOW (ref >60)
Glucose, Bld: 147 mg/dL — ABNORMAL HIGH (ref 70–99)
Potassium: 3.8 mmol/L (ref 3.5–5.1)
Sodium: 138 mmol/L (ref 135–145)
Total Bilirubin: 0.5 mg/dL (ref 0.3–1.2)
Total Protein: 5.5 g/dL — ABNORMAL LOW (ref 6.5–8.1)

## 2022-12-22 LAB — CORTISOL-AM, BLOOD: Cortisol - AM: 20.3 ug/dL (ref 6.7–22.6)

## 2022-12-22 LAB — URINALYSIS, COMPLETE (UACMP) WITH MICROSCOPIC
Bilirubin Urine: NEGATIVE
Glucose, UA: 50 mg/dL — AB
Hgb urine dipstick: NEGATIVE
Ketones, ur: NEGATIVE mg/dL
Nitrite: NEGATIVE
Protein, ur: NEGATIVE mg/dL
Specific Gravity, Urine: 1.011 (ref 1.005–1.030)
pH: 5 (ref 5.0–8.0)

## 2022-12-22 LAB — CBC
HCT: 23 % — ABNORMAL LOW (ref 39.0–52.0)
Hemoglobin: 7.3 g/dL — ABNORMAL LOW (ref 13.0–17.0)
MCH: 29.6 pg (ref 26.0–34.0)
MCHC: 31.7 g/dL (ref 30.0–36.0)
MCV: 93.1 fL (ref 80.0–100.0)
Platelets: 188 10*3/uL (ref 150–400)
RBC: 2.47 MIL/uL — ABNORMAL LOW (ref 4.22–5.81)
RDW: 15.2 % (ref 11.5–15.5)
WBC: 17.4 10*3/uL — ABNORMAL HIGH (ref 4.0–10.5)
nRBC: 0 % (ref 0.0–0.2)

## 2022-12-22 LAB — IRON AND TIBC
Iron: 19 ug/dL — ABNORMAL LOW (ref 45–182)
Saturation Ratios: 12 % — ABNORMAL LOW (ref 17.9–39.5)
TIBC: 155 ug/dL — ABNORMAL LOW (ref 250–450)
UIBC: 136 ug/dL

## 2022-12-22 LAB — CBG MONITORING, ED
Glucose-Capillary: 105 mg/dL — ABNORMAL HIGH (ref 70–99)
Glucose-Capillary: 126 mg/dL — ABNORMAL HIGH (ref 70–99)
Glucose-Capillary: 127 mg/dL — ABNORMAL HIGH (ref 70–99)
Glucose-Capillary: 150 mg/dL — ABNORMAL HIGH (ref 70–99)
Glucose-Capillary: 153 mg/dL — ABNORMAL HIGH (ref 70–99)

## 2022-12-22 LAB — FOLATE: Folate: 9.5 ng/mL (ref >5.9)

## 2022-12-22 LAB — PROCALCITONIN: Procalcitonin: 22.15 ng/mL

## 2022-12-22 LAB — LACTIC ACID, PLASMA
Lactic Acid, Venous: 2 mmol/L (ref 0.5–1.9)
Lactic Acid, Venous: 1.6 mmol/L (ref 0.5–1.9)

## 2022-12-22 LAB — PROTIME-INR
INR: 1.3 — ABNORMAL HIGH (ref 0.8–1.2)
Prothrombin Time: 16.5 seconds — ABNORMAL HIGH (ref 11.4–15.2)

## 2022-12-22 LAB — PHOSPHORUS: Phosphorus: 4.9 mg/dL — ABNORMAL HIGH (ref 2.5–4.6)

## 2022-12-22 LAB — MAGNESIUM: Magnesium: 1.8 mg/dL (ref 1.7–2.4)

## 2022-12-22 LAB — MRSA NEXT GEN BY PCR, NASAL: MRSA by PCR Next Gen: NOT DETECTED

## 2022-12-22 MED ORDER — ONDANSETRON HCL 4 MG/2ML IJ SOLN: 4.0000 mg | Freq: Four times a day (QID) | INTRAMUSCULAR | Status: DC | PRN

## 2022-12-22 MED ORDER — SODIUM CHLORIDE 0.9 % IV SOLN: 2.0000 g | Freq: Once | INTRAVENOUS | Status: DC

## 2022-12-22 MED ORDER — VANCOMYCIN HCL IN DEXTROSE 1-5 GM/200ML-% IV SOLN: 1000.0000 mg | Freq: Once | INTRAVENOUS | Status: DC

## 2022-12-22 MED ORDER — INSULIN ASPART 100 UNIT/ML IJ SOLN
0.0000 [IU] | INTRAMUSCULAR | Status: DC
  Administered 2022-12-22 (×2): 1 [IU] via SUBCUTANEOUS
  Administered 2022-12-22: 2 [IU] via SUBCUTANEOUS
  Administered 2022-12-22 – 2022-12-24 (×4): 1 [IU] via SUBCUTANEOUS
  Administered 2022-12-24: 0 [IU] via SUBCUTANEOUS
  Administered 2022-12-25: 1 [IU] via SUBCUTANEOUS
  Administered 2022-12-25: 0 [IU] via SUBCUTANEOUS
  Administered 2022-12-25 (×2): 1 [IU] via SUBCUTANEOUS
  Administered 2022-12-26 (×2): 2 [IU] via SUBCUTANEOUS
  Administered 2022-12-26 – 2022-12-27 (×3): 1 [IU] via SUBCUTANEOUS
  Administered 2022-12-27 (×2): 2 [IU] via SUBCUTANEOUS
  Administered 2022-12-28 – 2023-01-01 (×9): 1 [IU] via SUBCUTANEOUS
  Filled 2022-12-22 (×28): qty 1

## 2022-12-22 MED ORDER — PREDNISONE 20 MG PO TABS: 30.0000 mg | ORAL_TABLET | Freq: Every day | ORAL | Status: DC

## 2022-12-22 MED ORDER — IPRATROPIUM BROMIDE 0.02 % IN SOLN
0.5000 mg | Freq: Four times a day (QID) | RESPIRATORY_TRACT | Status: DC
  Administered 2022-12-22: 0.5 mg via RESPIRATORY_TRACT
  Filled 2022-12-22: qty 2.5

## 2022-12-22 MED ORDER — SODIUM CHLORIDE 0.9 % IV SOLN
2.0000 g | INTRAVENOUS | Status: AC
  Administered 2022-12-22 – 2022-12-29 (×7): 2 g via INTRAVENOUS
  Filled 2022-12-22 (×2): qty 12.5
  Filled 2022-12-22: qty 2
  Filled 2022-12-22: qty 12.5
  Filled 2022-12-22: qty 2
  Filled 2022-12-22 (×2): qty 12.5

## 2022-12-22 MED ORDER — ACETAMINOPHEN 650 MG RE SUPP: 650.0000 mg | Freq: Four times a day (QID) | RECTAL | Status: DC | PRN

## 2022-12-22 MED ORDER — HYDROCOD POLI-CHLORPHE POLI ER 10-8 MG/5ML PO SUER
5.0000 mL | Freq: Two times a day (BID) | ORAL | Status: DC | PRN
  Administered 2022-12-31: 5 mL via ORAL
  Filled 2022-12-22: qty 5

## 2022-12-22 MED ORDER — METRONIDAZOLE 500 MG/100ML IV SOLN
500.0000 mg | Freq: Two times a day (BID) | INTRAVENOUS | Status: DC
  Administered 2022-12-22 – 2022-12-26 (×9): 500 mg via INTRAVENOUS
  Filled 2022-12-22 (×10): qty 100

## 2022-12-22 MED ORDER — IPRATROPIUM-ALBUTEROL 0.5-2.5 (3) MG/3ML IN SOLN
3.0000 mL | Freq: Four times a day (QID) | RESPIRATORY_TRACT | Status: DC
  Administered 2022-12-22 – 2022-12-25 (×12): 3 mL via RESPIRATORY_TRACT
  Filled 2022-12-22 (×12): qty 3

## 2022-12-22 MED ORDER — SODIUM CHLORIDE 0.9 % IV SOLN
2.0000 g | INTRAVENOUS | Status: DC
  Administered 2022-12-22: 2 g via INTRAVENOUS
  Filled 2022-12-22: qty 12.5

## 2022-12-22 MED ORDER — GUAIFENESIN ER 600 MG PO TB12
600.0000 mg | ORAL_TABLET | Freq: Two times a day (BID) | ORAL | Status: DC
  Administered 2022-12-22 – 2023-01-03 (×25): 600 mg via ORAL
  Filled 2022-12-22 (×25): qty 1

## 2022-12-22 MED ORDER — HEPARIN SODIUM (PORCINE) 5000 UNIT/ML IJ SOLN
5000.0000 [IU] | Freq: Three times a day (TID) | INTRAMUSCULAR | Status: DC
  Administered 2022-12-22 – 2022-12-25 (×11): 5000 [IU] via SUBCUTANEOUS
  Filled 2022-12-22 (×11): qty 1

## 2022-12-22 MED ORDER — LACTATED RINGERS IV SOLN: INTRAVENOUS | Status: DC

## 2022-12-22 MED ORDER — ALBUTEROL SULFATE (2.5 MG/3ML) 0.083% IN NEBU: 2.5000 mg | INHALATION_SOLUTION | RESPIRATORY_TRACT | Status: DC | PRN

## 2022-12-22 MED ORDER — DEXTROSE IN LACTATED RINGERS 5 % IV SOLN: INTRAVENOUS | Status: DC

## 2022-12-22 MED ORDER — VANCOMYCIN HCL 1500 MG/300ML IV SOLN
1500.0000 mg | Freq: Once | INTRAVENOUS | Status: AC
  Administered 2022-12-22: 1500 mg via INTRAVENOUS
  Filled 2022-12-22: qty 300

## 2022-12-22 MED ORDER — PREDNISONE 20 MG PO TABS: 40.0000 mg | ORAL_TABLET | Freq: Every day | ORAL | Status: DC

## 2022-12-22 MED ORDER — METHYLPREDNISOLONE SODIUM SUCC 40 MG IJ SOLR
40.0000 mg | Freq: Two times a day (BID) | INTRAMUSCULAR | Status: DC
  Administered 2022-12-22 – 2022-12-23 (×3): 40 mg via INTRAVENOUS
  Filled 2022-12-22 (×3): qty 1

## 2022-12-22 MED ORDER — PREDNISONE 20 MG PO TABS: 20.0000 mg | ORAL_TABLET | Freq: Every day | ORAL | Status: DC

## 2022-12-22 MED ORDER — ONDANSETRON HCL 4 MG PO TABS: 4.0000 mg | ORAL_TABLET | Freq: Four times a day (QID) | ORAL | Status: DC | PRN

## 2022-12-22 MED ORDER — VANCOMYCIN VARIABLE DOSE PER UNSTABLE RENAL FUNCTION (PHARMACIST DOSING): Status: DC

## 2022-12-22 MED ORDER — ALBUTEROL SULFATE (2.5 MG/3ML) 0.083% IN NEBU
2.5000 mg | INHALATION_SOLUTION | Freq: Four times a day (QID) | RESPIRATORY_TRACT | Status: DC
  Administered 2022-12-22: 2.5 mg via RESPIRATORY_TRACT
  Filled 2022-12-22: qty 3

## 2022-12-22 MED ORDER — ALBUTEROL SULFATE (2.5 MG/3ML) 0.083% IN NEBU
2.5000 mg | INHALATION_SOLUTION | Freq: Four times a day (QID) | RESPIRATORY_TRACT | Status: DC | PRN
  Administered 2022-12-24 – 2023-01-02 (×10): 2.5 mg via RESPIRATORY_TRACT
  Filled 2022-12-22 (×11): qty 3

## 2022-12-22 MED ORDER — ACETAMINOPHEN 325 MG PO TABS: 650.0000 mg | ORAL_TABLET | Freq: Four times a day (QID) | ORAL | Status: DC | PRN

## 2022-12-22 MED ORDER — FLUTICASONE FUROATE-VILANTEROL 200-25 MCG/ACT IN AEPB
1.0000 | INHALATION_SPRAY | Freq: Every day | RESPIRATORY_TRACT | Status: DC
  Administered 2022-12-24 – 2023-01-03 (×11): 1 via RESPIRATORY_TRACT
  Filled 2022-12-22 (×2): qty 28

## 2022-12-22 MED ORDER — PREDNISONE 10 MG PO TABS: 10.0000 mg | ORAL_TABLET | Freq: Every day | ORAL | Status: DC

## 2022-12-22 NOTE — ED Notes (Signed)
Pt given meal tray.

## 2022-12-22 NOTE — ED Notes (Signed)
Pts top and bottom dentures placed in pink denture cup, on table beside computer, labeled with pt sticker

## 2022-12-22 NOTE — ED Notes (Signed)
Report off to alyssa rn   

## 2022-12-22 NOTE — ED Notes (Signed)
NP Neomia Glass at bedside

## 2022-12-22 NOTE — ED Notes (Signed)
Fsbs 150

## 2022-12-22 NOTE — Progress Notes (Signed)
CROSS COVER NOTE  NAME: Devin Washington MRN: [id number] DOB : 11-May-1923 ATTENDING PHYSICIAN: Rometta Emery, MD    Date of Service   12/22/2022   HPI/Events of Note   Notified of elevated Troponin 182. Devin Washington does not have chest pain and is no longer dyspneic. He is resting comfortably on nasal cannula. Troponin elevated likely related to demand from respiratory distress and hypoxia on presentation.  Interventions   Assessment/Plan:  Repeat EKG - initially read as Acute RCA Infarct, Devin Washington has a pacemaker and pacer mode was not turned on. See repeat EKG--? AV sequentially paced Trend Troponin - 182-->189       To reach the provider On-Call:   7AM- 7PM see care teams to locate the attending and reach out to them via [email or web address]. 7PM-7AM contact night-coverage If you still have difficulty reaching the appropriate provider, please page the [facility] (Director on Call) for [facility] on amion for assistance  This document was prepared using Sales executive software and may include unintentional dictation errors.  Devin Limbo DNP, MBA, FNP-BC Nurse Practitioner [facility] Pager [phone]

## 2022-12-22 NOTE — ED Notes (Signed)
Pt denies need at this time

## 2022-12-22 NOTE — Progress Notes (Addendum)
Pharmacy Antibiotic Note  Devin Washington is a 89 y.o. male admitted on 12/21/2022 with sepsis, Acute on chronic kidney disease, end organ failure  Pharmacy has been consulted for Vancomycin , Cefepime  dosing.  Plan: Cefepime 2 gm IV Q24H ordered to start on 1/8 @ 0051.  Vancomycin 1500 mg IV X 1 given in ED on 1/8 @ 0202 - Will dose by levels b/c of unstable renal function.  - Will draw random vanc on 1/9 @ 0200.  Goal trough : 15 - 20 mcg/mL   Height: 5\' 10"  (177.8 cm) Weight: 68.9 kg (152 lb) IBW/kg (Calculated) : 73  Temp (24hrs), Avg:96.6 F (35.9 C), Min:94 F (34.4 C), Max:98.5 F (36.9 C)  Recent Labs  Lab 12/21/22 1843 12/21/22 1854  WBC  --  16.8*  CREATININE  --  3.75*  LATICACIDVEN >9.0*  --     Estimated Creatinine Clearance: 11 mL/min (A) (by C-G formula based on SCr of 3.75 mg/dL (H)).    No Known Allergies  Antimicrobials this admission:   >>    >>   Dose adjustments this admission:   Microbiology results:  BCx:   UCx:    Sputum:    MRSA PCR:   Thank you for allowing pharmacy to be a part of this patient's care.  Terrel Manalo D 12/22/2022 1:14 AM

## 2022-12-22 NOTE — ED Notes (Signed)
Critical result:   TROPONIN: 182  Given to Neomia Glass NP at 0300 No new orders at this time

## 2022-12-22 NOTE — ED Notes (Signed)
Pt bed wet and had a BM. This English as a second language teacher and provided pericare

## 2022-12-22 NOTE — Progress Notes (Signed)
*  PRELIMINARY RESULTS* Echocardiogram 2D Echocardiogram has been performed.  Sherrie Sport 12/22/2022, 2:16 PM

## 2022-12-22 NOTE — ED Notes (Signed)
Resumed care from natalie rn.  Pt alert.  No acute distress.  Iv fluids infusing.

## 2022-12-22 NOTE — Progress Notes (Signed)
[facility] Progress Note  Patient: Devin Washington    MWN:[id number]  DOA: 12/21/2022     Date of Service: the patient was seen and examined on 12/22/2022  Chief Complaint  Patient presents with   Respiratory Distress   Brief hospital course:  ISSIAC SEACHRIST is a 89 y.o. male with medical history significant of atrial fibrillation, essential hypertension, chronic kidney disease stage IIIb, iron deficiency anemia, sick sinus syndrome status post pacemaker placement, hyperlipidemia, essential hypertension, chronic diastolic heart failure who was initially brought into the ER as a [name].  Patient was obtained there was significant hypoxic respiratory failure.  He is currently unable to give any history and is on BiPAP.  Patient arrived with oxygen sat of 62% on room air.  He was in respiratory distress on CPAP from EMS.  Workup so far showed severe sepsis with lactic acid of more than 9 and pH of 7.18.  Vitals are however stable.  Patient has chest x-ray that is negative.  Acute viral screen also negative.  No obvious source of sepsis.  At this point he is being admitted with acute hypoxic respiratory failure as well as severe sepsis of unknown source.  Workup is ongoing to determine the source.    Assessment and Plan:  # Severe sepsis with endorgan damage: Patient severely hypoxic although no evidence of pneumonia on chest x-ray.   CT C/a/p: Peripheral reticulations with bibasilar subpleural cystic changes suggestive of interstitial lung disease/pulmonary fibrosis. Urinalysis was not very impressive    Cefepime metronidazole and vancomycin.  Pharmacy to dose. Procalcitonin 22 elevated Trend WBC count Blood culture NGTD     # Acute hypoxic respiratory failure with COPD exacerbation S/p BiPAP.  Continue antibiotics treatment and titrate of BiPAP Started IV Solu-Medrol with tapering dose transition to prednisone Started Breo Ellipta inhaler, DuoNeb every 6 hourly scheduled followed by  as needed Started Mucinex extremity gram p.o. twice daily and Tussionex for cough as needed   # Elevated troponin, type II MI most likely due to demand ischemia Troponin 189 continue to trend Continue to monitor on telemetry TTE shows LVEF 40 to 45%, grade 1 diastolic dysfunction, LV demonstrate regional wall motion abnormality EKG shows paced rhythm Patient remained chest pain-free We will continue to monitor for now due to advanced age, prognosis is guarded.   # Chronic kidney disease stage III: Hydrate.  Monitor renal function Creatinine 3.75--3.2 Monitor renal foods daily, monitor urine output Avoid nephrotoxic medications, use renal dose medications   # severe lactic acidosis: Aggressively hydrate.  Continue antibiotics Lactic acidosis resolved, lactic acid 1.6 within normal range now. # sick sinus syndrome: Status post pacemaker placement.  Rate is now controlled.   # paroxysmal atrial fibrillation: Rate is controlled.  Home regimen.   # acute metabolic encephalopathy: Secondary to severe sepsis.  Resolved, patient is AOx3    # Anemia due to iron deficiency, monitor H&H Iron level 19, transferrin saturation 12% Folic acid 9.5 WNL Start oral iron supplement when stable, avoid IV iron due to active infection  # Pulmonary nodule, incidental finding on CT scan . Lingular subpleural 1.3 x 1 cm nodule. Consider one of the following in 3 months for both low-risk and high-risk individuals: (a) repeat chest CT, (b) follow-up PET-CT, or (c) tissue sampling.  PAD, abdominal aortic aneurysm CT A/P: Stable aneurysmal bilateral common iliac arteries. 6. Stable aneurysmal infrarenal abdominal aorta (3.4 cm). Recommend annual imaging followup by CTA or MRA.  Patient was recommended to  follow with PCP for incidental findings as above  Body mass index is 21.81 kg/m.  Interventions:       Diet: Heart healthy diet DVT Prophylaxis: Subcutaneous Heparin    Advance goals of care  discussion: DNR  Family Communication: family was present at bedside, at the time of interview.  The pt provided permission to discuss medical plan with the family. Opportunity was given to ask question and all questions were answered satisfactorily.   Disposition:  Pt is from Home, admitted with sepsis, still has sepsis, which precludes a safe discharge. Discharge to Home , when clinically stable, may need 2-3 more days to improve.  Subjective: Patient was admitted overnight due to severe sepsis, we will continue to monitor.  Patient is AOx3 no, altered mental status resolved.  Patient is still having shortness of breath, denies any chest pain or palpitations, no any other active issues.   Physical Exam: General: lying comfortably, Mild distress and SOB Appear in no distress, affect appropriate Eyes: PERRLA ENT: Oral Mucosa Clear, moist  Neck: no JVD,  Cardiovascular: S1 and S2 Present, no Murmur,  Respiratory: good respiratory effort, Bilateral Air entry equal and Decreased, mild Crackles and wheezing bilaterally Abdomen: Bowel Sound present, Soft and no tenderness,  Skin: no rashes Extremities: no Pedal edema, no calf tenderness Neurologic: without any new focal findings Gait not checked due to patient safety concerns  Vitals:   12/22/22 1000 12/22/22 1030 12/22/22 1400 12/22/22 1430  BP: (!) 128/52 (!) 128/57 (!) 141/51 (!) 149/58  Pulse: (!) 56 71 68 79  Resp: 14 (!) 30 15 14   Temp:      TempSrc:      SpO2: 91% 99% 100% 100%  Weight:      Height:        Intake/Output Summary (Last 24 hours) at 12/22/2022 1543 Last data filed at 12/22/2022 2841 Gross per 24 hour  Intake 1500 ml  Output 600 ml  Net 900 ml   Filed Weights   12/22/22 0019  Weight: 68.9 kg    Data Reviewed: I have personally reviewed and interpreted daily labs, tele strips, imagings as discussed above. I reviewed all nursing notes, pharmacy notes, vitals, pertinent old records I have discussed plan of  care as described above with RN and patient/family.  CBC: Recent Labs  Lab 12/21/22 1854 12/22/22 0350  WBC 16.8* 17.4*  NEUTROABS 14.8*  --   HGB 8.9* 7.3*  HCT 29.0* 23.0*  MCV 95.1 93.1  PLT 279 188   Basic Metabolic Panel: Recent Labs  Lab 12/21/22 1854 12/22/22 0350  NA 134* 138  K 4.6 3.8  CL 104 111  CO2 10* 18*  GLUCOSE 196* 147*  BUN 62* 61*  CREATININE 3.75* 3.20*  CALCIUM 8.6* 8.0*  MG  --  1.8  PHOS  --  4.9*    Studies: ECHOCARDIOGRAM COMPLETE  Result Date: 12/22/2022    ECHOCARDIOGRAM REPORT   Patient Name:   SAGAR OREA Date of Exam: 12/22/2022 Medical Rec #:  [id number]       Height:       70.0 in Accession #:    [id number]      Weight:       152.0 lb Date of Birth:  1923/01/26       BSA:          1.858 m Patient Age:    89 years        BP:  128/57 mmHg Patient Gender: M               HR:           81 bpm. Exam Location:  [facility] Procedure: 2D Echo, Cardiac Doppler and Color Doppler Indications:     Elevated Troponin  History:         Patient has no prior history of Echocardiogram examinations.                  Pacemaker. Diastolic chronic CHF, paroxysmal Afib.  Sonographer:     Cristela Blue Referring Phys:  [id number] Gillis Santa Diagnosing Phys: Lorine Bears MD  Sonographer Comments: Image acquisition challenging due to patient body habitus. IMPRESSIONS  1. Left ventricular ejection fraction, by estimation, is 40 to 45%. The left ventricle has mildly decreased function. The left ventricle demonstrates regional wall motion abnormalities (see scoring diagram/findings for description). There is moderate left ventricular hypertrophy. Left ventricular diastolic parameters are consistent with Grade I diastolic dysfunction (impaired relaxation). There is severe hypokinesis of the left ventricular, apical segment.  2. Right ventricular systolic function is normal. The right ventricular size is mildly enlarged. There is normal pulmonary artery systolic pressure.  3.  Left atrial size was mild to moderately dilated.  4. Right atrial size was mildly dilated.  5. The mitral valve is degenerative. Moderate mitral valve regurgitation. No evidence of mitral stenosis.  6. The aortic valve is calcified. There is severe calcifcation of the aortic valve. There is moderate thickening of the aortic valve. Aortic valve regurgitation is mild to moderate. Severe aortic valve stenosis. Aortic valve area, by VTI measures 0.74 cm. Aortic valve mean gradient measures 30.2 mmHg.  7. The inferior vena cava is normal in size with greater than 50% respiratory variability, suggesting right atrial pressure of 3 mmHg. FINDINGS  Left Ventricle: Left ventricular ejection fraction, by estimation, is 40 to 45%. The left ventricle has mildly decreased function. The left ventricle demonstrates regional wall motion abnormalities. The left ventricular internal cavity size was normal in size. There is moderate left ventricular hypertrophy. Left ventricular diastolic parameters are consistent with Grade I diastolic dysfunction (impaired relaxation). Right Ventricle: The right ventricular size is mildly enlarged. No increase in right ventricular wall thickness. Right ventricular systolic function is normal. There is normal pulmonary artery systolic pressure. The tricuspid regurgitant velocity is 2.74  m/s, and with an assumed right atrial pressure of 3 mmHg, the estimated right ventricular systolic pressure is 33.0 mmHg. Left Atrium: Left atrial size was mild to moderately dilated. Right Atrium: Right atrial size was mildly dilated. Pericardium: There is no evidence of pericardial effusion. Mitral Valve: The mitral valve is degenerative in appearance. There is moderate thickening of the mitral valve leaflet(s). Moderate mitral valve regurgitation. No evidence of mitral valve stenosis. Tricuspid Valve: The tricuspid valve is normal in structure. Tricuspid valve regurgitation is trivial. No evidence of tricuspid  stenosis. Aortic Valve: The aortic valve is calcified. There is severe calcifcation of the aortic valve. There is moderate thickening of the aortic valve. Aortic valve regurgitation is mild to moderate. Aortic regurgitation PHT measures 351 msec. Severe aortic stenosis is present. Aortic valve mean gradient measures 30.2 mmHg. Aortic valve peak gradient measures 53.7 mmHg. Aortic valve area, by VTI measures 0.74 cm. Pulmonic Valve: The pulmonic valve was normal in structure. Pulmonic valve regurgitation is trivial. No evidence of pulmonic stenosis. Aorta: The aortic root is normal in size and structure. Venous: The inferior vena cava is normal  in size with greater than 50% respiratory variability, suggesting right atrial pressure of 3 mmHg. IAS/Shunts: No atrial level shunt detected by color flow Doppler. Additional Comments: A device lead is visualized.  LEFT VENTRICLE PLAX 2D LVIDd:         4.40 cm   Diastology LVIDs:         2.70 cm   LV e' medial:    5.77 cm/s LV PW:         1.20 cm   LV E/e' medial:  12.2 LV IVS:        1.70 cm   LV e' lateral:   5.44 cm/s LVOT diam:     2.00 cm   LV E/e' lateral: 12.9 LV SV:         61 LV SV Index:   33 LVOT Area:     3.14 cm  RIGHT VENTRICLE RV Basal diam:  5.70 cm RV Mid diam:    4.20 cm RV S prime:     20.50 cm/s TAPSE (M-mode): 3.5 cm LEFT ATRIUM             Index        RIGHT ATRIUM           Index LA diam:        3.90 cm 2.10 cm/m   RA Area:     20.70 cm LA Vol (A2C):   93.9 ml 50.55 ml/m  RA Volume:   62.40 ml  33.59 ml/m LA Vol (A4C):   34.8 ml 18.73 ml/m LA Biplane Vol: 58.4 ml 31.44 ml/m  AORTIC VALVE AV Area (Vmax):    0.71 cm AV Area (Vmean):   0.74 cm AV Area (VTI):     0.74 cm AV Vmax:           366.50 cm/s AV Vmean:          258.000 cm/s AV VTI:            0.821 m AV Peak Grad:      53.7 mmHg AV Mean Grad:      30.2 mmHg LVOT Vmax:         83.00 cm/s LVOT Vmean:        60.900 cm/s LVOT VTI:          0.193 m LVOT/AV VTI ratio: 0.24 AI PHT:             351 msec  AORTA Ao Root diam: 3.50 cm MITRAL VALVE                TRICUSPID VALVE MV Area (PHT): 4.57 cm     TR Peak grad:   30.0 mmHg MV Decel Time: 166 msec     TR Vmax:        274.00 cm/s MV E velocity: 70.20 cm/s MV A velocity: 124.00 cm/s  SHUNTS MV E/A ratio:  0.57         Systemic VTI:  0.19 m                             Systemic Diam: 2.00 cm Lorine Bears MD Electronically signed by Lorine Bears MD Signature Date/Time: 12/22/2022/3:33:14 PM    Final    CT CHEST ABDOMEN PELVIS WO CONTRAST  Result Date: 12/21/2022 CLINICAL DATA:  Sepsis EXAM: CT CHEST, ABDOMEN AND PELVIS WITHOUT CONTRAST TECHNIQUE: Multidetector CT imaging of the chest, abdomen and pelvis was performed following the standard protocol  without IV contrast. RADIATION DOSE REDUCTION: This exam was performed according to the departmental dose-optimization program which includes automated exposure control, adjustment of the mA and/or kV according to patient size and/or use of iterative reconstruction technique. COMPARISON:  CT abdomen pelvis 01/21/2021 FINDINGS: CT CHEST FINDINGS Cardiovascular: Left chest wall dual lead pacemaker in grossly appropriate position. Prominent heart size. No significant pericardial effusion. The thoracic aorta is normal in caliber. Severe atherosclerotic plaque of the thoracic aorta. Aortic valve leaflet calcifications. At least 3 vessel coronary artery calcifications. Cardiac changes suggestive of anemia. Mediastinum/Nodes: No gross hilar adenopathy, noting limited sensitivity for the detection of hilar adenopathy on this noncontrast study. No enlarged mediastinal or axillary lymph nodes. Thyroid gland, trachea, and esophagus demonstrate no significant findings. Small hiatal hernia. Lungs/Pleura: Mild bronchial wall thickening. Mild paraseptal and centrilobular emphysematous changes. Mild peripheral reticulations with bibasilar subpleural cystic changes . No focal consolidation. Lingular subpleural 1.3 x 1 cm  nodule (4:86). No pulmonary mass. No pleural effusion. No pneumothorax. Musculoskeletal: No chest wall abnormality. No suspicious lytic or blastic osseous lesions. No acute displaced fracture. Multilevel severe degenerative changes of the spine. Severe degenerative changes of the right shoulder. CT ABDOMEN PELVIS FINDINGS Hepatobiliary: No focal liver abnormality. No gallstones, gallbladder wall thickening, or pericholecystic fluid. No biliary dilatation. Pancreas: No focal lesion. Normal pancreatic contour. No surrounding inflammatory changes. No main pancreatic ductal dilatation. Spleen: Normal in size without focal abnormality. Adrenals/Urinary Tract: No adrenal nodule bilaterally. No nephrolithiasis and no hydronephrosis. Fluid density lesions likely represent simple renal cysts-no further follow-up indicated. Redemonstration of a subcentimeter hyperdense lesion within the left kidney (2:67) too small to characterize. No ureterolithiasis or hydroureter. The urinary bladder is unremarkable. Stomach/Bowel: Stomach is within normal limits. No evidence of bowel wall thickening or dilatation. Appendix appears normal. Vascular/Lymphatic: Stable infrarenal abdominal aorta aneurysm measuring up to 3.4 x 3.2 cm extending approximately 3 cm in the craniocaudal dimension. Stable aneurysmal right common iliac artery measuring 2.9 cm. Stable aneurysmal left common iliac artery measuring 1.8 cm. Mild atherosclerotic plaque of the aorta and its branches. No abdominal, pelvic, or inguinal lymphadenopathy. Reproductive: Prostate is unremarkable. Other: No intraperitoneal free fluid. No intraperitoneal free gas. No organized fluid collection. Musculoskeletal: No abdominal wall hernia or abnormality. No suspicious lytic or blastic osseous lesions. No acute displaced fracture. Multilevel severe degenerative changes of the spine. IMPRESSION: 1. Lingular subpleural 1.3 x 1 cm nodule. Consider one of the following in 3 months for both  low-risk and high-risk individuals: (a) repeat chest CT, (b) follow-up PET-CT, or (c) tissue sampling. This recommendation follows the consensus statement: Guidelines for Management of Incidental Pulmonary Nodules Detected on CT Images: From the Fleischner Society 2017; Radiology 2017; 284:228-243. 2. Peripheral reticulations with bibasilar subpleural cystic changes suggestive of interstitial lung disease/pulmonary fibrosis. 3. Small hiatal hernia. 4. Cardiac changes suggestive of anemia. 5. Stable aneurysmal bilateral common iliac arteries. 6. Stable aneurysmal infrarenal abdominal aorta (3.4 cm). Recommend annual imaging followup by CTA or MRA. This recommendation follows 2010 ACCF/AHA/AATS/ACR/ASA/SCA/SCAI/SIR/STS/SVM Guidelines for the Diagnosis and Management of Patients with Thoracic Aortic Disease. Circulation.2010; 121: Z610-R604. Aortic aneurysm NOS (ICD10-I71.9). 7. Aortic Atherosclerosis (ICD10-I70.0) - severe, including at least 3 vessel coronary artery and aortic valve leaflet calcifications-correlate for aortic stenosis. 8. Emphysema (ICD10-J43.9). 9. Limited evaluation on this noncontrast study. Electronically Signed   By: Tish Frederickson M.D.   On: 12/21/2022 23:13   DG Chest Port 1 View  Result Date: 12/21/2022 CLINICAL DATA:  Questionable sepsis - evaluate for abnormality.  Respiratory distress. EXAM: PORTABLE CHEST 1 VIEW COMPARISON:  Chest radiograph 01/22/2021 FINDINGS: The patient is rotated to the left with unchanged cardiomediastinal silhouette. 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 Aortic atherosclerosis is noted. Chronic interstitial coarsening is similar to the prior study. No acute airspace consolidation, overt pulmonary edema, sizable pleural effusion, or pneumothorax is identified. No acute osseous abnormality is seen. IMPRESSION: No active disease. Electronically Signed   By: Sebastian Ache M.D.   On: 12/21/2022 19:17    Scheduled Meds:  fluticasone furoate-vilanterol  1 puff  Inhalation Daily   guaiFENesin  600 mg Oral BID   heparin  5,000 Units Subcutaneous Q8H   insulin aspart  0-9 Units Subcutaneous Q4H   ipratropium-albuterol  3 mL Nebulization Q6H   methylPREDNISolone (SOLU-MEDROL) injection  40 mg Intravenous Q12H   Followed by   Melene Muller ON 12/24/2022] predniSONE  40 mg Oral Q breakfast   Followed by   Melene Muller ON 12/27/2022] predniSONE  30 mg Oral Q breakfast   Followed by   Melene Muller ON 12/30/2022] predniSONE  20 mg Oral Q breakfast   Followed by   Melene Muller ON 01/02/2023] predniSONE  10 mg Oral Q breakfast   vancomycin variable dose per unstable renal function (pharmacist dosing)   Does not apply See admin instructions   Continuous Infusions:  [START ON 12/23/2022] ceFEPime (MAXIPIME) IV     lactated ringers 100 mL/hr at 12/22/22 1055   metronidazole Stopped (12/22/22 1514)   PRN Meds: acetaminophen **OR** acetaminophen, albuterol, chlorpheniramine-HYDROcodone, ondansetron **OR** ondansetron (ZOFRAN) IV  Time spent: 50 minutes  Author: Gillis Santa. MD [facility] 12/22/2022 3:43 PM  To reach On-call, see care teams to locate the attending and reach out to them via [email or web address]. If 7PM-7AM, please contact night-coverage If you still have difficulty reaching the attending provider, please page the [facility] (Director on Call) for [facility] on amion for assistance.

## 2022-12-22 NOTE — ED Notes (Addendum)
RN found pt with [place] off, O2sat 100%. Pt has no respiratory complaints and does not appear SOB at rest. Upon setting breakfast up independently he became SOB but O2 stayed at 100%. Applied 2L [place] for eating

## 2022-12-22 NOTE — ED Notes (Signed)
Neomia Glass NP made aware EKG completed and should be in pts chart to view. Hard copy of EKG given to MD Jari Pigg

## 2022-12-22 NOTE — ED Notes (Signed)
This RN received report and now assuming care of pt. 

## 2022-12-23 DIAGNOSIS — R6520 Severe sepsis without septic shock: Secondary | ICD-10-CM | POA: Diagnosis not present

## 2022-12-23 DIAGNOSIS — A419 Sepsis, unspecified organism: Secondary | ICD-10-CM | POA: Diagnosis not present

## 2022-12-23 LAB — BASIC METABOLIC PANEL
Anion gap: 10 (ref 5–15)
BUN: 44 mg/dL — ABNORMAL HIGH (ref 8–23)
CO2: 15 mmol/L — ABNORMAL LOW (ref 22–32)
Calcium: 8.2 mg/dL — ABNORMAL LOW (ref 8.9–10.3)
Chloride: 110 mmol/L (ref 98–111)
Creatinine, Ser: 2.36 mg/dL — ABNORMAL HIGH (ref 0.61–1.24)
GFR, Estimated: 24 mL/min — ABNORMAL LOW (ref >60)
Glucose, Bld: 109 mg/dL — ABNORMAL HIGH (ref 70–99)
Potassium: 4 mmol/L (ref 3.5–5.1)
Sodium: 135 mmol/L (ref 135–145)

## 2022-12-23 LAB — HEMOGLOBIN A1C
Hgb A1c MFr Bld: 5.6 % (ref 4.8–5.6)
Mean Plasma Glucose: 114 mg/dL

## 2022-12-23 LAB — CBG MONITORING, ED
Glucose-Capillary: 129 mg/dL — ABNORMAL HIGH (ref 70–99)
Glucose-Capillary: 103 mg/dL — ABNORMAL HIGH (ref 70–99)
Glucose-Capillary: 120 mg/dL — ABNORMAL HIGH (ref 70–99)
Glucose-Capillary: 103 mg/dL — ABNORMAL HIGH (ref 70–99)
Glucose-Capillary: 77 mg/dL (ref 70–99)

## 2022-12-23 LAB — CBC
HCT: 20.7 % — ABNORMAL LOW (ref 39.0–52.0)
Hemoglobin: 6.4 g/dL — ABNORMAL LOW (ref 13.0–17.0)
MCH: 29.1 pg (ref 26.0–34.0)
MCHC: 30.9 g/dL (ref 30.0–36.0)
MCV: 94.1 fL (ref 80.0–100.0)
Platelets: 146 10*3/uL — ABNORMAL LOW (ref 150–400)
RBC: 2.2 MIL/uL — ABNORMAL LOW (ref 4.22–5.81)
RDW: 15.5 % (ref 11.5–15.5)
WBC: 14.4 10*3/uL — ABNORMAL HIGH (ref 4.0–10.5)
nRBC: 0 % (ref 0.0–0.2)

## 2022-12-23 LAB — VITAMIN D 25 HYDROXY (VIT D DEFICIENCY, FRACTURES): Vit D, 25-Hydroxy: 28.96 ng/mL — ABNORMAL LOW (ref 30–100)

## 2022-12-23 LAB — MAGNESIUM: Magnesium: 1.5 mg/dL — ABNORMAL LOW (ref 1.7–2.4)

## 2022-12-23 LAB — PREPARE RBC (CROSSMATCH)

## 2022-12-23 LAB — URINE CULTURE

## 2022-12-23 LAB — TROPONIN I (HIGH SENSITIVITY): Troponin I (High Sensitivity): 447 ng/L (ref <18)

## 2022-12-23 LAB — PHOSPHORUS: Phosphorus: 3.9 mg/dL (ref 2.5–4.6)

## 2022-12-23 LAB — VITAMIN B12: Vitamin B-12: 2523 pg/mL — ABNORMAL HIGH (ref 180–914)

## 2022-12-23 MED ORDER — SODIUM BICARBONATE 8.4 % IV SOLN
50.0000 meq | Freq: Once | INTRAVENOUS | Status: AC
  Administered 2022-12-23: 50 meq via INTRAVENOUS
  Filled 2022-12-23: qty 50

## 2022-12-23 MED ORDER — SODIUM CHLORIDE 0.45 % IV SOLN
INTRAVENOUS | Status: DC
  Filled 2022-12-23 (×7): qty 75

## 2022-12-23 MED ORDER — POLYSACCHARIDE IRON COMPLEX 150 MG PO CAPS
150.0000 mg | ORAL_CAPSULE | Freq: Every day | ORAL | Status: DC
  Administered 2022-12-23 – 2023-01-02 (×11): 150 mg via ORAL
  Filled 2022-12-23 (×12): qty 1

## 2022-12-23 MED ORDER — AMLODIPINE BESYLATE 10 MG PO TABS
10.0000 mg | ORAL_TABLET | Freq: Every day | ORAL | Status: DC
  Administered 2022-12-23 – 2022-12-28 (×6): 10 mg via ORAL
  Filled 2022-12-23: qty 1
  Filled 2022-12-23: qty 2
  Filled 2022-12-23 (×3): qty 1
  Filled 2022-12-23: qty 2
  Filled 2022-12-23: qty 1

## 2022-12-23 MED ORDER — SODIUM CHLORIDE 0.9% IV SOLUTION: Freq: Once | INTRAVENOUS | Status: AC

## 2022-12-23 MED ORDER — SIMVASTATIN 20 MG PO TABS
40.0000 mg | ORAL_TABLET | Freq: Every day | ORAL | Status: DC
  Administered 2022-12-23 – 2022-12-24 (×2): 40 mg via ORAL
  Filled 2022-12-23 (×2): qty 4

## 2022-12-23 MED ORDER — BISACODYL 5 MG PO TBEC
10.0000 mg | DELAYED_RELEASE_TABLET | Freq: Every day | ORAL | Status: AC
  Administered 2022-12-23 – 2022-12-26 (×2): 10 mg via ORAL
  Filled 2022-12-23 (×4): qty 2

## 2022-12-23 MED ORDER — PANTOPRAZOLE SODIUM 40 MG IV SOLR
40.0000 mg | Freq: Two times a day (BID) | INTRAVENOUS | Status: DC
  Administered 2022-12-23 – 2022-12-27 (×9): 40 mg via INTRAVENOUS
  Filled 2022-12-23 (×9): qty 10

## 2022-12-23 MED ORDER — MAGNESIUM SULFATE 2 GM/50ML IV SOLN
2.0000 g | Freq: Once | INTRAVENOUS | Status: AC
  Administered 2022-12-23: 2 g via INTRAVENOUS
  Filled 2022-12-23: qty 50

## 2022-12-23 MED ORDER — ASPIRIN 81 MG PO TBEC
81.0000 mg | DELAYED_RELEASE_TABLET | Freq: Every day | ORAL | Status: DC
  Administered 2022-12-23 – 2023-01-02 (×11): 81 mg via ORAL
  Filled 2022-12-23 (×11): qty 1

## 2022-12-23 MED ORDER — PANTOPRAZOLE SODIUM 40 MG PO TBEC
40.0000 mg | DELAYED_RELEASE_TABLET | Freq: Two times a day (BID) | ORAL | Status: DC
  Administered 2022-12-23: 40 mg via ORAL
  Filled 2022-12-23: qty 1

## 2022-12-23 MED ORDER — VITAMIN C 500 MG PO TABS
500.0000 mg | ORAL_TABLET | Freq: Every day | ORAL | Status: DC
  Administered 2022-12-23 – 2023-01-02 (×11): 500 mg via ORAL
  Filled 2022-12-23 (×11): qty 1

## 2022-12-23 MED ORDER — TRAZODONE HCL 50 MG PO TABS
50.0000 mg | ORAL_TABLET | Freq: Every day | ORAL | Status: DC
  Administered 2022-12-23 – 2022-12-28 (×6): 50 mg via ORAL
  Filled 2022-12-23 (×6): qty 1

## 2022-12-23 MED ORDER — METOPROLOL TARTRATE 25 MG PO TABS
25.0000 mg | ORAL_TABLET | Freq: Two times a day (BID) | ORAL | Status: DC
  Administered 2022-12-23 – 2022-12-29 (×12): 25 mg via ORAL
  Filled 2022-12-23 (×14): qty 1

## 2022-12-23 MED ORDER — VITAMIN D (ERGOCALCIFEROL) 1.25 MG (50000 UNIT) PO CAPS
50000.0000 [IU] | ORAL_CAPSULE | ORAL | Status: DC
  Administered 2022-12-23 – 2022-12-30 (×2): 50000 [IU] via ORAL
  Filled 2022-12-23 (×2): qty 1

## 2022-12-23 NOTE — Progress Notes (Signed)
PT Cancellation Note  Patient Details Name: Devin Washington MRN: [id number] DOB: 05-29-1923   Cancelled Treatment:    Reason Eval/Treat Not Completed: Medical issues which prohibited therapy (Consult received and chart reviewed. Patient noted with critically low HgB (6.4) and uptrending troponin. Contraindicated for exertional activity at this time; will hold at this time and re-attempt at later date/time as medically appropriate.)  [name] H. Owens Shark, PT, DPT, NCS 12/23/22, 8:53 AM [phone]

## 2022-12-23 NOTE — Progress Notes (Signed)
OT Cancellation Note  Patient Details Name: Devin Washington MRN: [id number] DOB: 02-25-1923   Cancelled Treatment:    Reason Eval/Treat Not Completed: Medical issues which prohibited therapy. Chart reviewed - pt noted to have Hgb critically low at 6.4 and troponins continuing to trend upward; contraindicated for exertional activity at this time. Will continue to follow and initiate services as pt medically appropriate to participate in therapy.   Dessie Coma, M.S. OTR/L  12/23/22, 8:47 AM  ascom [phone]

## 2022-12-23 NOTE — ED Notes (Signed)
Notified K Foust NP of pt increasing troponin of 447.

## 2022-12-23 NOTE — Progress Notes (Signed)
[facility] Progress Note  Patient: Devin Washington    PJK:[id number]  DOA: 12/21/2022     Date of Service: the patient was seen and examined on 12/23/2022  Chief Complaint  Patient presents with   Respiratory Distress   Brief hospital course:  KAMRYN GAUTHIER is a 89 y.o. male with medical history significant of atrial fibrillation, essential hypertension, chronic kidney disease stage IIIb, iron deficiency anemia, sick sinus syndrome status post pacemaker placement, hyperlipidemia, essential hypertension, chronic diastolic heart failure who was initially brought into the ER as a [name].  Patient was obtained there was significant hypoxic respiratory failure.  He is currently unable to give any history and is on BiPAP.  Patient arrived with oxygen sat of 62% on room air.  He was in respiratory distress on CPAP from EMS.  Workup so far showed severe sepsis with lactic acid of more than 9 and pH of 7.18.  Vitals are however stable.  Patient has chest x-ray that is negative.  Acute viral screen also negative.  No obvious source of sepsis.  At this point he is being admitted with acute hypoxic respiratory failure as well as severe sepsis of unknown source.  Workup is ongoing to determine the source.    Assessment and Plan:  # Severe sepsis with endorgan damage: Patient severely hypoxic although no evidence of pneumonia on chest x-ray.   CT C/a/p: Peripheral reticulations with bibasilar subpleural cystic changes suggestive of interstitial lung disease/pulmonary fibrosis. Urinalysis was not very impressive    Continue cefepime metronidazole S/p vancomycin DC'd on 1/9, MRSA PCR negative, blood culture negative, WBC improving.  Procalcitonin 22 elevated Trend WBC count Blood culture NGTD    # Acute hypoxic respiratory failure with COPD exacerbation S/p BiPAP.  Continue antibiotics treatment and titrate of BiPAP S/p IV Solu-Medrol, discontinued steroids on 1/9 due to low hemoglobin, possible  GI bleed.  Patient's respiratory failure resolved, currently saturating well on room air. Started Breo Ellipta inhaler, DuoNeb every 6 hourly scheduled followed by as needed Started Mucinex extremity gram p.o. twice daily and Tussionex for cough as needed   # Elevated troponin, type II MI most likely due to demand ischemia Troponin 189--261--272  continue to trend Continue to monitor on telemetry TTE shows LVEF 40 to 45%, grade 1 diastolic dysfunction, LV demonstrate regional wall motion abnormality EKG shows paced rhythm Patient remained chest pain-free We will continue to monitor for now due to advanced age, prognosis is guarded.  # Anemia due to iron deficiency, monitor H&H Iron level 19, transferrin saturation 12% Folic acid 9.5 WNL Start oral iron supplement when stable, avoid IV iron due to active infection 1/9, Hb 6.4, transfuse 1 unit of PRBC Pantoprazole 40 mg IV twice daily Check FOBT  # Metabolic acidosis CO2 81--15 Sodium bicarbonate IV push given, started on sodium bicarbonate IV infusion Monitor BMP daily   # Chronic kidney disease stage III: Hydrate.  Monitor renal function Creatinine 3.75--3.2 Monitor renal foods daily, monitor urine output Avoid nephrotoxic medications, use renal dose medications   # Hypomagnesemia, mag repleted. Monitor and replete as needed.   # severe lactic acidosis: Aggressively hydrate.  Continue antibiotics Lactic acidosis resolved, lactic acid 1.6 within normal range now. # sick sinus syndrome: Status post pacemaker placement.  Rate is now controlled.   # paroxysmal atrial fibrillation: Rate is controlled.  Home regimen.   # acute metabolic encephalopathy: Secondary to severe sepsis.  Resolved, patient is AOx3   # Pulmonary nodule, incidental  finding on CT scan . Lingular subpleural 1.3 x 1 cm nodule. Consider one of the following in 3 months for both low-risk and high-risk individuals: (a) repeat chest CT, (b) follow-up  PET-CT, or (c) tissue sampling.  PAD, abdominal aortic aneurysm CT A/P: Stable aneurysmal bilateral common iliac arteries. 6. Stable aneurysmal infrarenal abdominal aorta (3.4 cm). Recommend annual imaging followup by CTA or MRA.  Patient was recommended to follow with PCP for incidental findings as above  Vitamin D insufficiency: started vitamin D 50,000 units p.o. weekly, follow with PCP to repeat vitamin D level after 3 to 6 months.  Body mass index is 21.81 kg/m.  Interventions:       Diet: Heart healthy diet DVT Prophylaxis: Subcutaneous Heparin    Advance goals of care discussion: DNR  Family Communication: family was present at bedside, at the time of interview.  The pt provided permission to discuss medical plan with the family. Opportunity was given to ask question and all questions were answered satisfactorily.   Disposition:  Pt is from Home, admitted with sepsis, still has sepsis, which precludes a safe discharge. Discharge to Home , when clinically stable, may need 2-3 more days to improve.  Subjective: No significant events overnight, patient still has chest congestion and cough but no any other complaints, denies any chest pain or palpitations, no shortness of breath.  Patient is saturating well on room air. Patient denies any abdominal pain, no nausea vomiting or diarrhea.   Physical Exam: General: lying comfortably, Mild distress and SOB Appear in no distress, affect appropriate Eyes: PERRLA ENT: Oral Mucosa Clear, moist  Neck: no JVD,  Cardiovascular: S1 and S2 Present, no Murmur,  Respiratory: good respiratory effort, Bilateral Air entry equal and Decreased, mild Crackles and mild wheezing bilaterally Abdomen: Bowel Sound present, Soft and no tenderness,  Skin: no rashes Extremities: no Pedal edema, no calf tenderness Neurologic: without any new focal findings Gait not checked due to patient safety concerns  Vitals:   12/23/22 1045 12/23/22 1100  12/23/22 1130 12/23/22 1200  BP:  (!) 150/88 (!) 151/78 (!) 148/61  Pulse: 83 (!) 163 76 (!) 109  Resp: (!) 23 (!) 21 (!) 22 (!) 22  Temp:      TempSrc:      SpO2: 100% 97% 93% 98%  Weight:      Height:        Intake/Output Summary (Last 24 hours) at 12/23/2022 1447 Last data filed at 12/23/2022 0601 Gross per 24 hour  Intake 200 ml  Output 1700 ml  Net -1500 ml   Filed Weights   12/22/22 0019  Weight: 68.9 kg    Data Reviewed: I have personally reviewed and interpreted daily labs, tele strips, imagings as discussed above. I reviewed all nursing notes, pharmacy notes, vitals, pertinent old records I have discussed plan of care as described above with RN and patient/family.  CBC: Recent Labs  Lab 12/21/22 1854 12/22/22 0350 12/23/22 0442  WBC 16.8* 17.4* 14.4*  NEUTROABS 14.8*  --   --   HGB 8.9* 7.3* 6.4*  HCT 29.0* 23.0* 20.7*  MCV 95.1 93.1 94.1  PLT 279 188 562*   Basic Metabolic Panel: Recent Labs  Lab 12/21/22 1854 12/22/22 0350 12/23/22 0442  NA 134* 138 135  K 4.6 3.8 4.0  CL 104 111 110  CO2 10* 18* 15*  GLUCOSE 196* 147* 109*  BUN 62* 61* 44*  CREATININE 3.75* 3.20* 2.36*  CALCIUM 8.6* 8.0* 8.2*  MG  --  1.8 1.5*  PHOS  --  4.9* 3.9    Studies: No results found.  Scheduled Meds:  amLODipine  10 mg Oral Daily   vitamin C  500 mg Oral Daily   aspirin EC  81 mg Oral Daily   fluticasone furoate-vilanterol  1 puff Inhalation Daily   guaiFENesin  600 mg Oral BID   heparin  5,000 Units Subcutaneous Q8H   insulin aspart  0-9 Units Subcutaneous Q4H   ipratropium-albuterol  3 mL Nebulization Q6H   iron polysaccharides  150 mg Oral Daily   methylPREDNISolone (SOLU-MEDROL) injection  40 mg Intravenous Q12H   Followed by   Melene Muller ON 12/24/2022] predniSONE  40 mg Oral Q breakfast   Followed by   Melene Muller ON 12/27/2022] predniSONE  30 mg Oral Q breakfast   Followed by   Melene Muller ON 12/30/2022] predniSONE  20 mg Oral Q breakfast   Followed by   Melene Muller ON  01/02/2023] predniSONE  10 mg Oral Q breakfast   metoprolol tartrate  25 mg Oral BID   pantoprazole  40 mg Oral BID   simvastatin  40 mg Oral QHS   traZODone  50 mg Oral QHS   Continuous Infusions:  ceFEPime (MAXIPIME) IV Stopped (12/23/22 0029)   metronidazole 500 mg (12/23/22 1443)   sodium bicarbonate 75 mEq in sodium chloride 0.45 % 1,075 mL infusion 75 mL/hr at 12/23/22 1156   PRN Meds: acetaminophen **OR** acetaminophen, albuterol, chlorpheniramine-HYDROcodone, ondansetron **OR** ondansetron (ZOFRAN) IV  Time spent: 55 minutes  Author: Gillis Santa. MD [facility] 12/23/2022 2:47 PM  To reach On-call, see care teams to locate the attending and reach out to them via [email or web address]. If 7PM-7AM, please contact night-coverage If you still have difficulty reaching the attending provider, please page the [facility] (Director on Call) for [facility] on amion for assistance.

## 2022-12-24 ENCOUNTER — Inpatient Hospital Stay: Payer: Medicare HMO

## 2022-12-24 DIAGNOSIS — R6520 Severe sepsis without septic shock: Secondary | ICD-10-CM | POA: Diagnosis not present

## 2022-12-24 DIAGNOSIS — A419 Sepsis, unspecified organism: Secondary | ICD-10-CM | POA: Diagnosis not present

## 2022-12-24 LAB — CBC
HCT: 27.5 % — ABNORMAL LOW (ref 39.0–52.0)
Hemoglobin: 8.9 g/dL — ABNORMAL LOW (ref 13.0–17.0)
MCH: 29.5 pg (ref 26.0–34.0)
MCHC: 32.4 g/dL (ref 30.0–36.0)
MCV: 91.1 fL (ref 80.0–100.0)
Platelets: 178 10*3/uL (ref 150–400)
RBC: 3.02 MIL/uL — ABNORMAL LOW (ref 4.22–5.81)
RDW: 15.2 % (ref 11.5–15.5)
WBC: 16.8 10*3/uL — ABNORMAL HIGH (ref 4.0–10.5)
nRBC: 0 % (ref 0.0–0.2)

## 2022-12-24 LAB — TROPONIN I (HIGH SENSITIVITY)
Troponin I (High Sensitivity): 448 ng/L (ref <18)
Troponin I (High Sensitivity): 451 ng/L (ref <18)
Troponin I (High Sensitivity): 542 ng/L (ref <18)
Troponin I (High Sensitivity): 576 ng/L (ref <18)

## 2022-12-24 LAB — CBG MONITORING, ED
Glucose-Capillary: 108 mg/dL — ABNORMAL HIGH (ref 70–99)
Glucose-Capillary: 144 mg/dL — ABNORMAL HIGH (ref 70–99)
Glucose-Capillary: 80 mg/dL (ref 70–99)
Glucose-Capillary: 151 mg/dL — ABNORMAL HIGH (ref 70–99)
Glucose-Capillary: 106 mg/dL — ABNORMAL HIGH (ref 70–99)
Glucose-Capillary: 113 mg/dL — ABNORMAL HIGH (ref 70–99)

## 2022-12-24 LAB — BPAM RBC
Blood Product Expiration Date: 202402162359
ISSUE DATE / TIME: 202401091848
Unit Type and Rh: 5100

## 2022-12-24 LAB — TYPE AND SCREEN
ABO/RH(D): O POS
Antibody Screen: NEGATIVE
Unit division: 0

## 2022-12-24 LAB — BASIC METABOLIC PANEL
Anion gap: 8 (ref 5–15)
BUN: 48 mg/dL — ABNORMAL HIGH (ref 8–23)
CO2: 21 mmol/L — ABNORMAL LOW (ref 22–32)
Calcium: 8.3 mg/dL — ABNORMAL LOW (ref 8.9–10.3)
Chloride: 109 mmol/L (ref 98–111)
Creatinine, Ser: 2.6 mg/dL — ABNORMAL HIGH (ref 0.61–1.24)
GFR, Estimated: 22 mL/min — ABNORMAL LOW (ref >60)
Glucose, Bld: 157 mg/dL — ABNORMAL HIGH (ref 70–99)
Potassium: 3.7 mmol/L (ref 3.5–5.1)
Sodium: 138 mmol/L (ref 135–145)

## 2022-12-24 LAB — MAGNESIUM: Magnesium: 2.1 mg/dL (ref 1.7–2.4)

## 2022-12-24 LAB — PHOSPHORUS: Phosphorus: 3.3 mg/dL (ref 2.5–4.6)

## 2022-12-24 NOTE — ED Notes (Signed)
Notified NP K Foust of pt troponin of 448.

## 2022-12-24 NOTE — ED Notes (Addendum)
Per pt, left sided CP upon deep inspiration. Pt states that he has had this pain for the past few hours and has had it in the past. States that the pain is always there at baseline but it gets worse sometimes and he can't think of what he takes at home that helps at home but it's an inhaler.  Albuterol breathing tx  given at this time and Breo Ellipta scheduled for 1000.  NP Foust notified.

## 2022-12-24 NOTE — Consult Note (Signed)
CARDIOLOGY CONSULT NOTE               Patient ID: Devin Washington MRN: [id number] DOB/AGE: [birth year] 89 y.o.  Admit date: 12/21/2022 Referring Physician Dr. Jossie Ng hospitalist Primary Physician Dr. Ancil Boozer [facility] Reason for Consultation respiratory failure shortness of breath sick sinus syndrome  HPI: Patient is a 89 year old black male history of atrial fibrillation hypertension chronic renal sufficiency anemia sick sinus syndrome permanent pacemaker in place hyperlipidemia diabetes aortic stenosis presented with acute hypoxic respiratory failure placed on BiPAP for sats in the 60s patient was being worked up and evaluated for sepsis with high lactic acid and a pH of 7.18 suggestive of extreme lactic acidosis patient denies any chest pain chest x-ray was unremarkable viral studies were negative patient has significant improvement in his symptoms and had improved encephalopathy cardiology was consulted for further evaluation and management  Review of systems complete and found to be negative unless listed above     History reviewed. No pertinent past medical history.  History reviewed. No pertinent surgical history.  (Not in a hospital admission)  Social History   Socioeconomic History   Marital status: Single    Spouse name: Not on file   Number of children: Not on file   Years of education: Not on file   Highest education level: Not on file  Occupational History   Not on file  Tobacco Use   Smoking status: Former    Types: Cigarettes   Smokeless tobacco: Never  Substance and Sexual Activity   Alcohol use: Not Currently   Drug use: Never   Sexual activity: Not Currently  Other Topics Concern   Not on file  Social History Narrative   Not on file   Social Determinants of Health   Financial Resource Strain: Not on file  Food Insecurity: No Food Insecurity (12/22/2022)   Hunger Vital Sign    Worried About Running Out of Food  in the Last Year: Never true    Ran Out of Food in the Last Year: Never true  Transportation Needs: No Transportation Needs (12/22/2022)   PRAPARE - Hydrologist (Medical): No    Lack of Transportation (Non-Medical): No  Physical Activity: Not on file  Stress: Not on file  Social Connections: Not on file  Intimate Partner Violence: Not At Risk (12/22/2022)   Humiliation, Afraid, Rape, and Kick questionnaire    Fear of Current or Ex-Partner: No    Emotionally Abused: No    Physically Abused: No    Sexually Abused: No    History reviewed. No pertinent family history.    Review of systems complete and found to be negative unless listed above      PHYSICAL EXAM  General: Well developed, well nourished, in no acute distress HEENT:  Normocephalic and atramatic Neck:  No JVD.  Lungs: Clear bilaterally to auscultation and percussion. Heart: HRRR . Normal S1 and S2 without gallops or 3/6 sem murmurs.  Abdomen: Bowel sounds are positive, abdomen soft and non-tender  Msk:  Back normal, normal gait. Normal strength and tone for age. Extremities: No clubbing, cyanosis or 3+edema.  Left leg Neuro: Alert and oriented X 3. Psych:  Good affect, responds appropriately  Labs:   Lab Results  Component Value Date   WBC 16.8 (H) 12/24/2022   HGB 8.9 (L) 12/24/2022   HCT 27.5 (L) 12/24/2022   MCV 91.1 12/24/2022   PLT 178 12/24/2022  CARDIOLOGY CONSULT NOTE               Patient ID: Devin Washington MRN: [id number] DOB/AGE: [birth year] 89 y.o.  Admit date: 12/21/2022 Referring Physician Dr. Jossie Ng hospitalist Primary Physician Dr. Ancil Boozer [facility] Primary Cardiologist [facility] Reason for Consultation respiratory failure shortness of breath sick sinus syndrome  HPI: Patient is a 89 year old black male history of atrial fibrillation hypertension chronic renal sufficiency anemia sick sinus syndrome permanent pacemaker in place hyperlipidemia diabetes aortic stenosis presented with acute hypoxic respiratory failure placed on BiPAP for sats in the 60s patient was being worked up and evaluated for sepsis with high lactic acid and a pH of 7.18 suggestive of extreme lactic acidosis patient denies any chest pain chest x-ray was unremarkable viral studies were negative patient has significant improvement in his symptoms and had improved encephalopathy cardiology was consulted for further evaluation and management  Review of systems complete and found to be negative unless listed above     History reviewed. No pertinent past medical history.  History reviewed. No pertinent surgical history.  (Not in a hospital admission)  Social History   Socioeconomic History   Marital status: Single    Spouse name: Not on file   Number of children: Not on file   Years of education: Not on file   Highest education level: Not on file  Occupational History   Not on file  Tobacco Use   Smoking status: Former    Types: Cigarettes   Smokeless tobacco: Never  Substance and Sexual Activity   Alcohol use: Not Currently   Drug use: Never   Sexual activity: Not Currently  Other Topics Concern   Not on file  Social History Narrative   Not on file   Social Determinants of Health   Financial Resource Strain: Not on file  Food Insecurity: No Food Insecurity (12/22/2022)   Hunger Vital Sign    Worried About Running Out of Food  in the Last Year: Never true    Ran Out of Food in the Last Year: Never true  Transportation Needs: No Transportation Needs (12/22/2022)   PRAPARE - Hydrologist (Medical): No    Lack of Transportation (Non-Medical): No  Physical Activity: Not on file  Stress: Not on file  Social Connections: Not on file  Intimate Partner Violence: Not At Risk (12/22/2022)   Humiliation, Afraid, Rape, and Kick questionnaire    Fear of Current or Ex-Partner: No    Emotionally Abused: No    Physically Abused: No    Sexually Abused: No    History reviewed. No pertinent family history.    Review of systems complete and found to be negative unless listed above      PHYSICAL EXAM  General: Well developed, well nourished, in no acute distress HEENT:  Normocephalic and atramatic Neck:  No JVD.  Lungs: Clear bilaterally to auscultation and percussion. Heart: HRRR . Normal S1 and S2 without gallops or 3/6 sem murmurs.  Abdomen: Bowel sounds are positive, abdomen soft and non-tender  Msk:  Back normal, normal gait. Normal strength and tone for age. Extremities: No clubbing, cyanosis or 3+edema.  Left leg Neuro: Alert and oriented X 3. Psych:  Good affect, responds appropriately  Labs:   Lab Results  Component Value Date   WBC 16.8 (H) 12/24/2022   HGB 8.9 (L) 12/24/2022   HCT 27.5 (L) 12/24/2022   MCV 91.1 12/24/2022   PLT 178 12/24/2022  CARDIOLOGY CONSULT NOTE               Patient ID: Devin Washington MRN: [id number] DOB/AGE: [birth year] 89 y.o.  Admit date: 12/21/2022 Referring Physician Dr. Jossie Ng hospitalist Primary Physician Dr. Ancil Boozer [facility] Primary Cardiologist [facility] Reason for Consultation respiratory failure shortness of breath sick sinus syndrome  HPI: Patient is a 89 year old black male history of atrial fibrillation hypertension chronic renal sufficiency anemia sick sinus syndrome permanent pacemaker in place hyperlipidemia diabetes aortic stenosis presented with acute hypoxic respiratory failure placed on BiPAP for sats in the 60s patient was being worked up and evaluated for sepsis with high lactic acid and a pH of 7.18 suggestive of extreme lactic acidosis patient denies any chest pain chest x-ray was unremarkable viral studies were negative patient has significant improvement in his symptoms and had improved encephalopathy cardiology was consulted for further evaluation and management  Review of systems complete and found to be negative unless listed above     History reviewed. No pertinent past medical history.  History reviewed. No pertinent surgical history.  (Not in a hospital admission)  Social History   Socioeconomic History   Marital status: Single    Spouse name: Not on file   Number of children: Not on file   Years of education: Not on file   Highest education level: Not on file  Occupational History   Not on file  Tobacco Use   Smoking status: Former    Types: Cigarettes   Smokeless tobacco: Never  Substance and Sexual Activity   Alcohol use: Not Currently   Drug use: Never   Sexual activity: Not Currently  Other Topics Concern   Not on file  Social History Narrative   Not on file   Social Determinants of Health   Financial Resource Strain: Not on file  Food Insecurity: No Food Insecurity (12/22/2022)   Hunger Vital Sign    Worried About Running Out of Food  in the Last Year: Never true    Ran Out of Food in the Last Year: Never true  Transportation Needs: No Transportation Needs (12/22/2022)   PRAPARE - Hydrologist (Medical): No    Lack of Transportation (Non-Medical): No  Physical Activity: Not on file  Stress: Not on file  Social Connections: Not on file  Intimate Partner Violence: Not At Risk (12/22/2022)   Humiliation, Afraid, Rape, and Kick questionnaire    Fear of Current or Ex-Partner: No    Emotionally Abused: No    Physically Abused: No    Sexually Abused: No    History reviewed. No pertinent family history.    Review of systems complete and found to be negative unless listed above      PHYSICAL EXAM  General: Well developed, well nourished, in no acute distress HEENT:  Normocephalic and atramatic Neck:  No JVD.  Lungs: Clear bilaterally to auscultation and percussion. Heart: HRRR . Normal S1 and S2 without gallops or 3/6 sem murmurs.  Abdomen: Bowel sounds are positive, abdomen soft and non-tender  Msk:  Back normal, normal gait. Normal strength and tone for age. Extremities: No clubbing, cyanosis or 3+edema.  Left leg Neuro: Alert and oriented X 3. Psych:  Good affect, responds appropriately  Labs:   Lab Results  Component Value Date   WBC 16.8 (H) 12/24/2022   HGB 8.9 (L) 12/24/2022   HCT 27.5 (L) 12/24/2022   MCV 91.1 12/24/2022   PLT 178 12/24/2022  Recent Labs  Lab 12/22/22 0350 12/23/22 0442 12/24/22 0420  NA 138   < > 138  K 3.8   < > 3.7  CL 111   < > 109  CO2 18*   < > 21*  BUN 61*   < > 48*  CREATININE 3.20*   < > 2.60*  CALCIUM 8.0*   < > 8.3*  PROT 5.5*  --   --   BILITOT 0.5  --   --   ALKPHOS 60  --   --   ALT 18  --   --   AST 19  --   --   GLUCOSE 147*   < > 157*   < > = values in this interval not displayed.   No results found for: "CKTOTAL", "CKMB", "CKMBINDEX", "TROPONINI" No results found for: "CHOL" No results found for: "HDL" No results found  for: "LDLCALC" No results found for: "TRIG" No results found for: "CHOLHDL" No results found for: "LDLDIRECT"    Radiology: ECHOCARDIOGRAM COMPLETE  Result Date: 12/22/2022    ECHOCARDIOGRAM REPORT   Patient Name:   Devin Washington Date of Exam: 12/22/2022 Medical Rec #:  [id number]       Height:       70.0 in Accession #:    [id number]      Weight:       152.0 lb Date of Birth:  02-26-1923       BSA:          1.858 m Patient Age:    24 years        BP:           128/57 mmHg Patient Gender: M               HR:           81 bpm. Exam Location:  [facility] Procedure: 2D Echo, Cardiac Doppler and Color Doppler Indications:     Elevated Troponin  History:         Patient has no prior history of Echocardiogram examinations.                  Pacemaker. Diastolic chronic CHF, paroxysmal Afib.  Sonographer:     Sherrie Sport Referring Phys:  [place] Diagnosing Phys: Kathlyn Sacramento MD  Sonographer Comments: Image acquisition challenging due to patient body habitus. IMPRESSIONS  1. Left ventricular ejection fraction, by estimation, is 40 to 45%. The left ventricle has mildly decreased function. The left ventricle demonstrates regional wall motion abnormalities (see scoring diagram/findings for description). There is moderate left ventricular hypertrophy. Left ventricular diastolic parameters are consistent with Grade I diastolic dysfunction (impaired relaxation). There is severe hypokinesis of the left ventricular, apical segment.  2. Right ventricular systolic function is normal. The right ventricular size is mildly enlarged. There is normal pulmonary artery systolic pressure.  3. Left atrial size was mild to moderately dilated.  4. Right atrial size was mildly dilated.  5. The mitral valve is degenerative. Moderate mitral valve regurgitation. No evidence of mitral stenosis.  6. The aortic valve is calcified. There is severe calcifcation of the aortic valve. There is moderate thickening of the aortic valve. Aortic  valve regurgitation is mild to moderate. Severe aortic valve stenosis. Aortic valve area, by VTI measures 0.74 cm. Aortic valve mean gradient measures 30.2 mmHg.  7. The inferior vena cava is normal in size with greater than 50% respiratory variability, suggesting right atrial pressure of 3 mmHg.  Recent Labs  Lab 12/22/22 0350 12/23/22 0442 12/24/22 0420  NA 138   < > 138  K 3.8   < > 3.7  CL 111   < > 109  CO2 18*   < > 21*  BUN 61*   < > 48*  CREATININE 3.20*   < > 2.60*  CALCIUM 8.0*   < > 8.3*  PROT 5.5*  --   --   BILITOT 0.5  --   --   ALKPHOS 60  --   --   ALT 18  --   --   AST 19  --   --   GLUCOSE 147*   < > 157*   < > = values in this interval not displayed.   No results found for: "CKTOTAL", "CKMB", "CKMBINDEX", "TROPONINI" No results found for: "CHOL" No results found for: "HDL" No results found  for: "LDLCALC" No results found for: "TRIG" No results found for: "CHOLHDL" No results found for: "LDLDIRECT"    Radiology: ECHOCARDIOGRAM COMPLETE  Result Date: 12/22/2022    ECHOCARDIOGRAM REPORT   Patient Name:   Devin Washington Date of Exam: 12/22/2022 Medical Rec #:  [id number]       Height:       70.0 in Accession #:    [id number]      Weight:       152.0 lb Date of Birth:  02-26-1923       BSA:          1.858 m Patient Age:    24 years        BP:           128/57 mmHg Patient Gender: M               HR:           81 bpm. Exam Location:  [facility] Procedure: 2D Echo, Cardiac Doppler and Color Doppler Indications:     Elevated Troponin  History:         Patient has no prior history of Echocardiogram examinations.                  Pacemaker. Diastolic chronic CHF, paroxysmal Afib.  Sonographer:     Sherrie Sport Referring Phys:  [place] Diagnosing Phys: Kathlyn Sacramento MD  Sonographer Comments: Image acquisition challenging due to patient body habitus. IMPRESSIONS  1. Left ventricular ejection fraction, by estimation, is 40 to 45%. The left ventricle has mildly decreased function. The left ventricle demonstrates regional wall motion abnormalities (see scoring diagram/findings for description). There is moderate left ventricular hypertrophy. Left ventricular diastolic parameters are consistent with Grade I diastolic dysfunction (impaired relaxation). There is severe hypokinesis of the left ventricular, apical segment.  2. Right ventricular systolic function is normal. The right ventricular size is mildly enlarged. There is normal pulmonary artery systolic pressure.  3. Left atrial size was mild to moderately dilated.  4. Right atrial size was mildly dilated.  5. The mitral valve is degenerative. Moderate mitral valve regurgitation. No evidence of mitral stenosis.  6. The aortic valve is calcified. There is severe calcifcation of the aortic valve. There is moderate thickening of the aortic valve. Aortic  valve regurgitation is mild to moderate. Severe aortic valve stenosis. Aortic valve area, by VTI measures 0.74 cm. Aortic valve mean gradient measures 30.2 mmHg.  7. The inferior vena cava is normal in size with greater than 50% respiratory variability, suggesting right atrial pressure of 3 mmHg.

## 2022-12-24 NOTE — ED Notes (Signed)
NP Foust made aware of pt rising troponin.  No new orders at this time.

## 2022-12-24 NOTE — ED Notes (Signed)
Pt provided with pericare, complete linen and gown change, chux pads, and new male purewick at this time.

## 2022-12-24 NOTE — Progress Notes (Signed)
PT Cancellation Note  Patient Details Name: Devin Washington MRN: [id number] DOB: 1923-01-23   Cancelled Treatment:    Reason Eval/Treat Not Completed: Patient not medically ready PT orders received, chart reviewed. Pt noted to have ongoing uptrending troponins, c/o chest pain, & awaiting cardiology consult. Will hold PT evaluation at this time & f/u as able & once pt is medically appropriate for PT intervention.  [name], PT, DPT 12/24/22, 1:50 PM   [name] 12/24/2022, 1:49 PM

## 2022-12-24 NOTE — Progress Notes (Signed)
[facility] Progress Note  Patient: Devin Washington    OVF:[id number]  DOA: 12/21/2022     Date of Service: the patient was seen and examined on 12/24/2022  Chief Complaint  Patient presents with   Respiratory Distress   Brief hospital course:  OZAN MACLAY is a 89 y.o. male with medical history significant of atrial fibrillation, essential hypertension, chronic kidney disease stage IIIb, iron deficiency anemia, sick sinus syndrome status post pacemaker placement, hyperlipidemia, essential hypertension, chronic diastolic heart failure who was initially brought into the ER as a [name].  Patient was obtained there was significant hypoxic respiratory failure.  He is currently unable to give any history and is on BiPAP.  Patient arrived with oxygen sat of 62% on room air.  He was in respiratory distress on CPAP from EMS.  Workup so far showed severe sepsis with lactic acid of more than 9 and pH of 7.18.  Vitals are however stable.  Patient has chest x-ray that is negative.  Acute viral screen also negative.  No obvious source of sepsis.  At this point he is being admitted with acute hypoxic respiratory failure as well as severe sepsis of unknown source.  Workup is ongoing to determine the source.    Assessment and Plan:  # Severe sepsis with endorgan damage: Patient severely hypoxic although no evidence of pneumonia on chest x-ray.   CT C/a/p: Peripheral reticulations with bibasilar subpleural cystic changes suggestive of interstitial lung disease/pulmonary fibrosis. Urinalysis was not very impressive, urine culture multiple species, contamination Continue cefepime metronidazole S/p vancomycin DC'd on 1/9, MRSA PCR negative, blood culture negative, WBC improving.  Procalcitonin 22 elevated Trend WBC count Blood culture NGTD    # Acute hypoxic respiratory failure with COPD exacerbation S/p BiPAP.  Continue antibiotics treatment and titrate of BiPAP S/p IV Solu-Medrol, discontinued  steroids on 1/9 due to low hemoglobin, possible GI bleed.  Patient's respiratory failure resolved, currently saturating well on room air. Started Breo Ellipta inhaler, DuoNeb every 6 hourly scheduled followed by as needed Started Mucinex extremity gram p.o. twice daily and Tussionex for cough as needed   # Elevated troponin, type II MI most likely due to demand ischemia Troponin 189----476  continue to trend Continue to monitor on telemetry TTE shows LVEF 40 to 84%, grade 1 diastolic dysfunction, LV demonstrate regional wall motion abnormality EKG shows paced rhythm Patient remained chest pain-free Cardiology consulted Avoided aspirin until we rule out GI bleeding, patient's hemoglobin dropped and received blood transfusion   # Anemia due to iron deficiency, monitor H&H Iron level 19, transferrin saturation 16% Folic acid 9.5 WNL Start oral iron supplement when stable, avoid IV iron due to active infection 1/9, Hb 6.4, transfuse 1 unit of PRBC 1/10 Hb 8.9 stable Pantoprazole 40 mg IV twice daily Check FOBT  # Metabolic acidosis CO2 60--63--01 improving Sodium bicarbonate IV push given, started on sodium bicarbonate IV infusion Monitor BMP daily   # Chronic kidney disease stage III: Hydrate.  Monitor renal function Creatinine 3.75--3.2--2.6 Monitor renal foods daily, monitor urine output Avoid nephrotoxic medications, use renal dose medications   # Hypomagnesemia, mag repleted.  Resolved Monitor and replete as needed.   # severe lactic acidosis: Aggressively hydrate.  Continue antibiotics Lactic acidosis resolved, lactic acid 1.6 within normal range now. # sick sinus syndrome: Status post pacemaker placement.  Rate is now controlled.   # paroxysmal atrial fibrillation: Rate is controlled.  Home regimen.   # acute metabolic encephalopathy: Secondary to  severe sepsis.  Resolved, patient is AOx3   # Pulmonary nodule, incidental finding on CT scan CT chest: Lingular  subpleural 1.3 x 1 cm nodule. Consider one of the following in 3 months for both low-risk and high-risk individuals: (a) repeat chest CT, (b) follow-up PET-CT, or (c) tissue sampling. Patient was recommended to follow with PCP and pulmonary as an outpatient  PAD, abdominal aortic aneurysm CT A/P: Stable aneurysmal bilateral common iliac arteries. 6. Stable aneurysmal infrarenal abdominal aorta (3.4 cm). Recommend annual imaging followup by CTA or MRA.  Patient was recommended to follow with PCP for incidental findings as above  Vitamin D insufficiency: started vitamin D 50,000 units p.o. weekly, follow with PCP to repeat vitamin D level after 3 to 6 months.  Body mass index is 21.81 kg/m.  Interventions:       Diet: Heart healthy diet DVT Prophylaxis: Subcutaneous Heparin    Advance goals of care discussion: DNR  Family Communication: family was present at bedside, at the time of interview.  The pt provided permission to discuss medical plan with the family. Opportunity was given to ask question and all questions were answered satisfactorily.   Disposition:  Pt is from Home, admitted with sepsis, still has sepsis, which precludes a safe discharge. Discharge most likely to Home TBD waiting for PT and OT eval, when clinically stable, may need 2-3 more days to improve.  Subjective: No significant events overnight, patient's breathing is better now and cough has improved chest pain or palpitations.  Informed patient that his troponin is elevated, we will consult cardiology for further recommendation.   Physical Exam: General: lying comfortably, Mild distress and SOB Appear in no distress, affect appropriate Eyes: PERRLA ENT: Oral Mucosa Clear, moist  Neck: no JVD,  Cardiovascular: S1 and S2 Present, no Murmur,  Respiratory: good respiratory effort, Bilateral Air entry equal and Decreased, mild Crackles and mild wheezing bilaterally Abdomen: Bowel Sound present, Soft and no  tenderness,  Skin: no rashes Extremities: no Pedal edema, no calf tenderness Neurologic: without any new focal findings Gait not checked due to patient safety concerns  Vitals:   12/24/22 1130 12/24/22 1200 12/24/22 1400 12/24/22 1418  BP: (!) 147/53 (!) 147/62 138/64   Pulse: 79 85 89   Resp:  18    Temp:    98 F (36.7 C)  TempSrc:      SpO2: 100% 98% 95%   Weight:      Height:        Intake/Output Summary (Last 24 hours) at 12/24/2022 1509 Last data filed at 12/23/2022 2150 Gross per 24 hour  Intake 870 ml  Output 650 ml  Net 220 ml   Filed Weights   12/22/22 0019  Weight: 68.9 kg    Data Reviewed: I have personally reviewed and interpreted daily labs, tele strips, imagings as discussed above. I reviewed all nursing notes, pharmacy notes, vitals, pertinent old records I have discussed plan of care as described above with RN and patient/family.  CBC: Recent Labs  Lab 12/21/22 1854 12/22/22 0350 12/23/22 0442 12/24/22 0420  WBC 16.8* 17.4* 14.4* 16.8*  NEUTROABS 14.8*  --   --   --   HGB 8.9* 7.3* 6.4* 8.9*  HCT 29.0* 23.0* 20.7* 27.5*  MCV 95.1 93.1 94.1 91.1  PLT 279 188 146* 178   Basic Metabolic Panel: Recent Labs  Lab 12/21/22 1854 12/22/22 0350 12/23/22 0442 12/24/22 0420  NA 134* 138 135 138  K 4.6 3.8 4.0 3.7  CL 104 111 110 109  CO2 10* 18* 15* 21*  GLUCOSE 196* 147* 109* 157*  BUN 62* 61* 44* 48*  CREATININE 3.75* 3.20* 2.36* 2.60*  CALCIUM 8.6* 8.0* 8.2* 8.3*  MG  --  1.8 1.5* 2.1  PHOS  --  4.9* 3.9 3.3    Studies: No results found.  Scheduled Meds:  sodium chloride   Intravenous Once   amLODipine  10 mg Oral Daily   vitamin C  500 mg Oral Daily   aspirin EC  81 mg Oral Daily   bisacodyl  10 mg Oral QHS   fluticasone furoate-vilanterol  1 puff Inhalation Daily   guaiFENesin  600 mg Oral BID   heparin  5,000 Units Subcutaneous Q8H   insulin aspart  0-9 Units Subcutaneous Q4H   ipratropium-albuterol  3 mL Nebulization Q6H    iron polysaccharides  150 mg Oral Daily   metoprolol tartrate  25 mg Oral BID   pantoprazole (PROTONIX) IV  40 mg Intravenous Q12H   simvastatin  40 mg Oral QHS   traZODone  50 mg Oral QHS   Vitamin D (Ergocalciferol)  50,000 Units Oral Q7 days   Continuous Infusions:  ceFEPime (MAXIPIME) IV Stopped (12/24/22 0239)   metronidazole Stopped (12/24/22 1417)   sodium bicarbonate 75 mEq in sodium chloride 0.45 % 1,075 mL infusion 75 mL/hr at 12/24/22 0909   PRN Meds: acetaminophen **OR** acetaminophen, albuterol, chlorpheniramine-HYDROcodone, ondansetron **OR** ondansetron (ZOFRAN) IV  Time spent: 50 minutes  Author: Val Riles. MD [facility] 12/24/2022 3:09 PM  To reach On-call, see care teams to locate the attending and reach out to them via [email or web address]. If 7PM-7AM, please contact night-coverage If you still have difficulty reaching the attending provider, please page the [facility] (Director on Call) for [facility] on amion for assistance.

## 2022-12-24 NOTE — Progress Notes (Signed)
OT Cancellation Note  Patient Details Name: Devin Washington MRN: [id number] DOB: 01-27-1923   Cancelled Treatment:    Reason Eval/Treat Not Completed: Patient declined, no reason specified. Chart reviewed, cleared for participation by MD/cardiologist. Upon arrival pt just received lunch tray, requesting to eat at this time. Will re-attempt as able.   Dessie Coma, M.S. OTR/L  12/24/22, 4:14 PM  ascom [phone]

## 2022-12-24 NOTE — Progress Notes (Addendum)
CROSS COVER NOTE  NAME: Devin Washington MRN: [id number] DOB : 1923/05/10 ATTENDING PHYSICIAN: Devin Santa, MD    Date of Service   12/24/2022   HPI/Events of Note   Troponin continued to trend up overnight. Mr Devin Washington continues to deny chest pain and dyspnea. Denies abdominal pain and nausea.  61: RN reports Mr Devin Washington is endorsing chronic (L) chest pain on inspiration that is always present at baseline.  Interventions   Assessment/Plan: Troponin Trend -- 182-->189-->261-->272-->447-->448-->451 Consider Cardiology consult      To reach the provider On-Call:   7AM- 7PM see care teams to locate the attending and reach out to them via [email or web address] Password: [email or web address] 7PM-7AM contact night-coverage If you still have difficulty reaching the appropriate provider, please page the [facility] (Director on Call) for [facility] on amion for assistance  This document was prepared using Conservation officer, historic buildings and may include unintentional dictation errors.  Devin Limbo DNP, MBA, FNP-BC Nurse Practitioner [facility] Pager [phone]

## 2022-12-24 NOTE — ED Notes (Signed)
Pt given snack per his request 

## 2022-12-25 DIAGNOSIS — R6520 Severe sepsis without septic shock: Secondary | ICD-10-CM | POA: Diagnosis not present

## 2022-12-25 DIAGNOSIS — A419 Sepsis, unspecified organism: Secondary | ICD-10-CM | POA: Diagnosis not present

## 2022-12-25 LAB — BASIC METABOLIC PANEL
Anion gap: 11 (ref 5–15)
BUN: 43 mg/dL — ABNORMAL HIGH (ref 8–23)
CO2: 17 mmol/L — ABNORMAL LOW (ref 22–32)
Calcium: 7.8 mg/dL — ABNORMAL LOW (ref 8.9–10.3)
Chloride: 111 mmol/L (ref 98–111)
Creatinine, Ser: 2.52 mg/dL — ABNORMAL HIGH (ref 0.61–1.24)
GFR, Estimated: 23 mL/min — ABNORMAL LOW (ref >60)
Glucose, Bld: 92 mg/dL (ref 70–99)
Potassium: 3.5 mmol/L (ref 3.5–5.1)
Sodium: 139 mmol/L (ref 135–145)

## 2022-12-25 LAB — CBC
HCT: 26.5 % — ABNORMAL LOW (ref 39.0–52.0)
Hemoglobin: 8.6 g/dL — ABNORMAL LOW (ref 13.0–17.0)
MCH: 29.5 pg (ref 26.0–34.0)
MCHC: 32.5 g/dL (ref 30.0–36.0)
MCV: 90.8 fL (ref 80.0–100.0)
Platelets: 159 10*3/uL (ref 150–400)
RBC: 2.92 MIL/uL — ABNORMAL LOW (ref 4.22–5.81)
RDW: 15.3 % (ref 11.5–15.5)
WBC: 12.9 10*3/uL — ABNORMAL HIGH (ref 4.0–10.5)
nRBC: 0 % (ref 0.0–0.2)

## 2022-12-25 LAB — GLUCOSE, CAPILLARY
Glucose-Capillary: 139 mg/dL — ABNORMAL HIGH (ref 70–99)
Glucose-Capillary: 126 mg/dL — ABNORMAL HIGH (ref 70–99)
Glucose-Capillary: 122 mg/dL — ABNORMAL HIGH (ref 70–99)
Glucose-Capillary: 129 mg/dL — ABNORMAL HIGH (ref 70–99)

## 2022-12-25 LAB — MAGNESIUM: Magnesium: 1.9 mg/dL (ref 1.7–2.4)

## 2022-12-25 LAB — CBG MONITORING, ED
Glucose-Capillary: 134 mg/dL — ABNORMAL HIGH (ref 70–99)
Glucose-Capillary: 111 mg/dL — ABNORMAL HIGH (ref 70–99)

## 2022-12-25 LAB — PHOSPHORUS: Phosphorus: 2.8 mg/dL (ref 2.5–4.6)

## 2022-12-25 MED ORDER — ATORVASTATIN CALCIUM 20 MG PO TABS
20.0000 mg | ORAL_TABLET | Freq: Every day | ORAL | Status: DC
  Administered 2022-12-25 – 2023-01-01 (×8): 20 mg via ORAL
  Filled 2022-12-25 (×8): qty 1

## 2022-12-25 MED ORDER — IPRATROPIUM-ALBUTEROL 0.5-2.5 (3) MG/3ML IN SOLN
3.0000 mL | RESPIRATORY_TRACT | Status: DC | PRN
  Administered 2022-12-30 – 2023-01-02 (×4): 3 mL via RESPIRATORY_TRACT
  Filled 2022-12-25 (×5): qty 3

## 2022-12-25 MED ORDER — APIXABAN 2.5 MG PO TABS
2.5000 mg | ORAL_TABLET | Freq: Two times a day (BID) | ORAL | Status: DC
  Administered 2022-12-25 – 2023-01-02 (×17): 2.5 mg via ORAL
  Filled 2022-12-25 (×17): qty 1

## 2022-12-25 NOTE — Care Management Important Message (Signed)
Important Message  Patient Details  Name: Devin Washington MRN: [id number] Date of Birth: 1923-05-07   Medicare Important Message Given:  Yes     Dannette Barbara 12/25/2022, 1:46 PM

## 2022-12-25 NOTE — Progress Notes (Signed)
[facility] Progress Note  Patient: ASHAUN GAUGHAN    IRS:[id number]  DOA: 12/21/2022     Date of Service: the patient was seen and examined on 12/25/2022  Chief Complaint  Patient presents with   Respiratory Distress   Brief hospital course:  GENEVA PALLAS is a 89 y.o. male with medical history significant of atrial fibrillation, essential hypertension, chronic kidney disease stage IIIb, iron deficiency anemia, sick sinus syndrome status post pacemaker placement, hyperlipidemia, essential hypertension, chronic diastolic heart failure who was initially brought into the ER as a [name].  Patient was obtained there was significant hypoxic respiratory failure.  He is currently unable to give any history and is on BiPAP.  Patient arrived with oxygen sat of 62% on room air.  He was in respiratory distress on CPAP from EMS.  Workup so far showed severe sepsis with lactic acid of more than 9 and pH of 7.18.  Vitals are however stable.  Patient has chest x-ray that is negative.  Acute viral screen also negative.  No obvious source of sepsis.  At this point he is being admitted with acute hypoxic respiratory failure as well as severe sepsis of unknown source.  Workup is ongoing to determine the source.    Assessment and Plan:  # Severe sepsis with endorgan damage: Patient severely hypoxic although no evidence of pneumonia on chest x-ray.   CT C/a/p: Peripheral reticulations with bibasilar subpleural cystic changes suggestive of interstitial lung disease/pulmonary fibrosis. Urinalysis was not very impressive, urine culture multiple species, contamination Continue cefepime metronidazole S/p vancomycin DC'd on 1/9, MRSA PCR negative, blood culture negative, WBC improving.  Procalcitonin 22 elevated Trend WBC count Blood culture NGTD    Right lower extremity DVT Duplex shows DVT in the posterior tibial vein Vascular surgery consulted, recommended Eliquis 2.5 milligram p.o. twice daily Chronic  thrombosis left lower extremity aneurysm, surgery recommended no intervention, got opinion via secure chat, no official consult done   # Acute hypoxic respiratory failure with COPD exacerbation S/p BiPAP.  Continue antibiotics treatment and titrate of BiPAP S/p IV Solu-Medrol, discontinued steroids on 1/9 due to low hemoglobin, possible GI bleed.  Patient's respiratory failure resolved, currently saturating well on room air. Started Breo Ellipta inhaler, DuoNeb every 6 hourly scheduled followed by as needed Started Mucinex extremity gram p.o. twice daily and Tussionex for cough as needed   # Elevated troponin, type II MI most likely due to demand ischemia Troponin 189----476  continue to trend Continue to monitor on telemetry TTE shows LVEF 40 to 45%, grade 1 diastolic dysfunction, LV demonstrate regional wall motion abnormality EKG shows paced rhythm Patient remained chest pain-free Cardiology consulted, recommended no invasive cardiac procedure at this stage Avoided aspirin until we rule out GI bleeding, patient's hemoglobin dropped and received blood transfusion   # Anemia due to iron deficiency, monitor H&H Iron level 19, transferrin saturation 12% Folic acid 9.5 WNL Start oral iron supplement when stable, avoid IV iron due to active infection 1/9, Hb 6.4, transfuse 1 unit of PRBC 1/10 Hb 8.9 stable Pantoprazole 40 mg IV twice daily Check FOBT  # Metabolic acidosis CO2 81--15--21--17  Sodium bicarbonate IV push given, started on sodium bicarbonate IV infusion Monitor BMP daily   # Chronic kidney disease stage III: Hydrate.  Monitor renal function Creatinine 3.75--3.2--2.6--2.5 Monitor renal foods daily, monitor urine output Avoid nephrotoxic medications, use renal dose medications   # Hypomagnesemia, mag repleted.  Resolved Monitor and replete as needed.   #  severe lactic acidosis: Aggressively hydrate.  Continue antibiotics Lactic acidosis resolved, lactic acid 1.6  within normal range now. # sick sinus syndrome: Status post pacemaker placement.  Rate is now controlled.   # paroxysmal atrial fibrillation: Rate is controlled.  Home regimen.   # acute metabolic encephalopathy: Secondary to severe sepsis.  Resolved, patient is AOx3   # Pulmonary nodule, incidental finding on CT scan CT chest: Lingular subpleural 1.3 x 1 cm nodule. Consider one of the following in 3 months for both low-risk and high-risk individuals: (a) repeat chest CT, (b) follow-up PET-CT, or (c) tissue sampling. Patient was recommended to follow with PCP and pulmonary as an outpatient  PAD, abdominal aortic aneurysm CT A/P: Stable aneurysmal bilateral common iliac arteries. 6. Stable aneurysmal infrarenal abdominal aorta (3.4 cm). Recommend annual imaging followup by CTA or MRA.  Patient was recommended to follow with PCP for incidental findings as above  Vitamin D insufficiency: started vitamin D 50,000 units p.o. weekly, follow with PCP to repeat vitamin D level after 3 to 6 months.  Body mass index is 21.81 kg/m.  Interventions:       Diet: Heart healthy diet DVT Prophylaxis: Subcutaneous Heparin    Advance goals of care discussion: DNR  Family Communication: family was present at bedside, at the time of interview.  The pt provided permission to discuss medical plan with the family. Opportunity was given to ask question and all questions were answered satisfactorily.   Disposition:  Pt is from Home, admitted with sepsis, still has sepsis, which precludes a safe discharge. Discharge most likely to Home TBD waiting for PT and OT eval, when clinically stable, may need 2-3 more days to improve.  Subjective: Patient stated that he had a very bad night, in the morning time he is feeling fine, back to normal.  Shortness of breath is getting better, still has productive cough.  No any other active issues. Patient was informed about right lower extremity DVT, he agreed to start  Eliquis.  Patient was also informed about the thrombosed left lower extremity aneurysm, no intervention needed.   Physical Exam: General: lying comfortably, Mild distress and SOB Appear in no distress, affect appropriate Eyes: PERRLA ENT: Oral Mucosa Clear, moist  Neck: no JVD,  Cardiovascular: S1 and S2 Present, no Murmur,  Respiratory: good respiratory effort, Bilateral Air entry equal and Decreased, mild Crackles and mild wheezing bilaterally Abdomen: Bowel Sound present, Soft and no tenderness,  Skin: no rashes Extremities: RLE no Pedal edema, no calf tenderness, left calf thrombosed chronic aneurysm Neurologic: without any new focal findings Gait not checked due to patient safety concerns  Vitals:   12/25/22 0530 12/25/22 0554 12/25/22 0642 12/25/22 0831  BP: (!) 140/65  (!) 149/56 (!) 149/57  Pulse: 71  70   Resp: 20  17   Temp:  98.7 F (37.1 C) 98 F (36.7 C)   TempSrc:  Oral    SpO2: 97%  100% 100%  Weight:      Height:        Intake/Output Summary (Last 24 hours) at 12/25/2022 1647 Last data filed at 12/25/2022 1435 Gross per 24 hour  Intake 2590 ml  Output --  Net 2590 ml   Filed Weights   12/22/22 0019  Weight: 68.9 kg    Data Reviewed: I have personally reviewed and interpreted daily labs, tele strips, imagings as discussed above. I reviewed all nursing notes, pharmacy notes, vitals, pertinent old records I have discussed plan of care  as described above with RN and patient/family.  CBC: Recent Labs  Lab 12/21/22 1854 12/22/22 0350 12/23/22 0442 12/24/22 0420 12/25/22 0500  WBC 16.8* 17.4* 14.4* 16.8* 12.9*  NEUTROABS 14.8*  --   --   --   --   HGB 8.9* 7.3* 6.4* 8.9* 8.6*  HCT 29.0* 23.0* 20.7* 27.5* 26.5*  MCV 95.1 93.1 94.1 91.1 90.8  PLT 279 188 146* 178 159   Basic Metabolic Panel: Recent Labs  Lab 12/21/22 1854 12/22/22 0350 12/23/22 0442 12/24/22 0420 12/25/22 0500  NA 134* 138 135 138 139  K 4.6 3.8 4.0 3.7 3.5  CL 104 111 110  109 111  CO2 10* 18* 15* 21* 17*  GLUCOSE 196* 147* 109* 157* 92  BUN 62* 61* 44* 48* 43*  CREATININE 3.75* 3.20* 2.36* 2.60* 2.52*  CALCIUM 8.6* 8.0* 8.2* 8.3* 7.8*  MG  --  1.8 1.5* 2.1 1.9  PHOS  --  4.9* 3.9 3.3 2.8    Studies: US Venous Img Lower Bilateral (DVT)  Result Date: 12/24/2022 .  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 The superficial great saphenous vein was also interrogated. Spectral Doppler was utilized to evaluate flow at rest and with distal augmentation maneuvers in the common femoral, femoral and popliteal veins. COMPARISON:  01/21/2021 FINDINGS: RIGHT LOWER EXTREMITY Common Femoral Vein: No evidence of thrombus. Normal compressibility, respiratory phasicity and response to augmentation. Saphenofemoral Junction: No evidence of thrombus. Normal compressibility and flow on color Doppler imaging. Profunda Femoral Vein: No evidence of thrombus. Normal compressibility and flow on color Doppler imaging. Femoral Vein: No evidence of thrombus. Normal compressibility, respiratory phasicity and response to augmentation. Popliteal Vein: No evidence of thrombus. Normal compressibility, respiratory phasicity and response to augmentation. Calf Veins: Occlusive echogenic thrombus within the right posterior tibial vein. Superficial Great Saphenous Vein: No evidence of thrombus. Normal compressibility. Venous Reflux:  None. Other Findings: Large nonvascular heterogeneous masslike structure which  appears to surround a stent in the right popliteal region likely a thrombosed aneurysm sac. LEFT LOWER EXTREMITY Common Femoral Vein: No evidence of thrombus. Normal compressibility, respiratory phasicity and response to augmentation. Saphenofemoral Junction: No evidence of thrombus. Normal compressibility and flow on color Doppler imaging. Profunda Femoral Vein: No evidence of thrombus. Normal compressibility and flow on color Doppler imaging. Femoral Vein: No evidence of thrombus. Normal compressibility, respiratory phasicity and response to augmentation. Popliteal Vein: No evidence of thrombus. Normal compressibility, respiratory phasicity and response to augmentation. Calf Veins: No evidence of thrombus. Normal compressibility and flow on color Doppler imaging. Superficial Great Saphenous Vein: No evidence of thrombus. Normal compressibility. Venous Reflux:  None. 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 Additional masslike structure more proximally within the left thigh which does have some peripheral flow suggesting a partially thrombosed aneurysm. IMPRESSION: 1. Occlusive DVT within the right posterior tibial vein. 2. No evidence of left lower extremity deep vein thrombosis. 3. Large elongated heterogeneous mass-like structure  in the posterior left thigh standing to the popliteal fossa, similar in appearance to the prior ultrasound and most compatible with a thrombosed popliteal artery aneurysm sac. Additional masslike structure more proximally within the left thigh does have some peripheral flow suggesting a partially thrombosed aneurysm sac likely involving the left common femoral artery. 4. Large nonvascular heterogeneous masslike structure which appears to surround a stent in the right popliteal region  likely a thrombosed aneurysm sac. 5. Similar findings have been present on the previous exams. Correlate for symptoms of peripheral arterial disease. If further imaging evaluation is clinically warranted, a CTA run-off study could be considered. Electronically Signed   By: Davina Poke D.O.   On: 12/24/2022 19:41    Scheduled Meds:  sodium chloride   Intravenous Once   amLODipine  10 mg Oral Daily   apixaban  2.5 mg Oral BID   vitamin C  500 mg Oral Daily   aspirin EC  81 mg Oral Daily   atorvastatin  20 mg Oral Daily   bisacodyl  10 mg Oral QHS   fluticasone furoate-vilanterol  1 puff Inhalation Daily   guaiFENesin  600 mg Oral BID   insulin aspart  0-9 Units Subcutaneous Q4H   iron polysaccharides  150 mg Oral Daily   metoprolol tartrate  25 mg Oral BID   pantoprazole (PROTONIX) IV  40 mg Intravenous Q12H   traZODone  50 mg Oral QHS   Vitamin D (Ergocalciferol)  50,000 Units Oral Q7 days   Continuous Infusions:  ceFEPime (MAXIPIME) IV Stopped (12/25/22 0320)   metronidazole 500 mg (12/25/22 1155)   sodium bicarbonate 75 mEq in sodium chloride 0.45 % 1,075 mL infusion 75 mL/hr at 12/25/22 1435   PRN Meds: acetaminophen **OR** acetaminophen, albuterol, chlorpheniramine-HYDROcodone, ipratropium-albuterol, ondansetron **OR** ondansetron (ZOFRAN) IV  Time spent: 50 minutes  Author: Val Riles. MD [facility] 12/25/2022 4:47 PM  To reach On-call, see care teams to locate the attending and reach out to them via [email or web address]. If 7PM-7AM, please contact night-coverage If you still have difficulty reaching the attending provider, please page the [facility] (Director on Call) for [facility] on amion for assistance.

## 2022-12-25 NOTE — Plan of Care (Signed)
Received consult via secure chat regarding patient's right lower extremity DVT in the posterior tibial vein.  The patient also has a longstanding history of repair of popliteal artery aneurysms.  This patient is well-known to our practice.  Although his current chart does not show his longstanding vascular history, he is currently marked for merge with another chart.  Once merged it was show his longstanding history of follow-up with this.  Left lower extremity the patient has a femoral to peroneal artery bypass.  The patient had "embolization of his SFA done to reduce pressure to his popliteal artery aneurysm in 2019.  This is since shown thrombosis and has been stable.  The right popliteal artery aneurysm was treated with a stent also in 2019.  The patient has had a longstanding history of lymphedema and lower extremity edema.  At his most recent follow-up his swelling was well-controlled.  Based on the tibial level DVT we do not recommend intervention.  We recommend 2.5 mg twice daily dose of Eliquis for approximately 3 months for treatment of his posterior tibial vein DVT.  We can follow-up with the patient on an outpatient setting.    Kris Hartmann, NP

## 2022-12-25 NOTE — Progress Notes (Signed)
PT Cancellation Note  Patient Details Name: Devin Washington MRN: [id number] DOB: 1923/05/30   Cancelled Treatment:    Reason Eval/Treat Not Completed: Patient not medically ready PT orders received, chart reviewed. Pt noted to have new LE DVT with MD recommending to hold PT/OT evaluations at this time. Will f/u once medically appropriate.   [name], PT, DPT 12/25/22, 9:55 AM   [name] 12/25/2022, 9:55 AM

## 2022-12-25 NOTE — Progress Notes (Signed)
[facility] Cardiology    SUBJECTIVE: Patient resting comfortably no pain no fever no chills no sweats   Vitals:   12/25/22 0554 12/25/22 0642 12/25/22 0831 12/25/22 1530  BP:  (!) 149/56 (!) 149/57 (!) 146/52  Pulse:  70    Resp:  17  17  Temp: 98.7 F (37.1 C) 98 F (36.7 C) 98.2 F (36.8 C) 97.9 F (36.6 C)  TempSrc: Oral  Oral Oral  SpO2:  100% 100% 99%  Weight:      Height:         Intake/Output Summary (Last 24 hours) at 12/25/2022 1959 Last data filed at 12/25/2022 1800 Gross per 24 hour  Intake 2590 ml  Output 400 ml  Net 2190 ml      PHYSICAL EXAM  General: Well developed, well nourished, in no acute distress HEENT:  Normocephalic and atramatic Neck:  No JVD.  Lungs: Clear bilaterally to auscultation and percussion. Heart: HRRR . Normal S1 and S2 without gallops or murmurs.  Abdomen: Bowel sounds are positive, abdomen soft and non-tender  Msk:  Back normal, normal gait. Normal strength and tone for age. Extremities: No clubbing, cyanosis or edema.   Neuro: Alert and oriented X 3. Psych:  Good affect, responds appropriately   LABS: Basic Metabolic Panel: Recent Labs    12/24/22 0420 12/25/22 0500  NA 138 139  K 3.7 3.5  CL 109 111  CO2 21* 17*  GLUCOSE 157* 92  BUN 48* 43*  CREATININE 2.60* 2.52*  CALCIUM 8.3* 7.8*  MG 2.1 1.9  PHOS 3.3 2.8   Liver Function Tests: No results for input(s): "AST", "ALT", "ALKPHOS", "BILITOT", "PROT", "ALBUMIN" in the last 72 hours. No results for input(s): "LIPASE", "AMYLASE" in the last 72 hours. CBC: Recent Labs    12/24/22 0420 12/25/22 0500  WBC 16.8* 12.9*  HGB 8.9* 8.6*  HCT 27.5* 26.5*  MCV 91.1 90.8  PLT 178 159   Cardiac Enzymes: No results for input(s): "CKTOTAL", "CKMB", "CKMBINDEX", "TROPONINI" in the last 72 hours. BNP: Invalid input(s): "POCBNP" D-Dimer: No results for input(s): "DDIMER" in the last 72 hours. Hemoglobin A1C: No results for input(s): "HGBA1C" in the last 72 hours. Fasting  Lipid Panel: No results for input(s): "CHOL", "HDL", "LDLCALC", "TRIG", "CHOLHDL", "LDLDIRECT" in the last 72 hours. Thyroid Function Tests: No results for input(s): "TSH", "T4TOTAL", "T3FREE", "THYROIDAB" in the last 72 hours.  Invalid input(s): "FREET3" Anemia Panel: No results for input(s): "VITAMINB12", "FOLATE", "FERRITIN", "TIBC", "IRON", "RETICCTPCT" in the last 72 hours.  US Venous Img Lower Bilateral (DVT)  Result Date: 12/24/2022 .  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 The superficial great saphenous vein was also interrogated. Spectral Doppler was utilized to evaluate flow at rest and with distal augmentation maneuvers in the common femoral, femoral and popliteal veins. COMPARISON:  01/21/2021 FINDINGS: RIGHT LOWER EXTREMITY Common Femoral Vein: No evidence of thrombus. Normal compressibility, respiratory phasicity and response to augmentation. Saphenofemoral Junction: No evidence of thrombus. Normal compressibility and flow on color Doppler imaging. Profunda Femoral Vein: No evidence of thrombus. Normal compressibility and flow on color Doppler  imaging. Femoral Vein: No evidence of thrombus. Normal compressibility, respiratory phasicity and response to augmentation. Popliteal Vein: No evidence of thrombus. Normal compressibility, respiratory phasicity and response to augmentation. Calf Veins: Occlusive echogenic thrombus within the right posterior tibial vein. Superficial Great Saphenous  Vein: No evidence of thrombus. Normal compressibility. Venous Reflux:  None. Other Findings: Large nonvascular heterogeneous masslike structure which appears to surround a stent in the right popliteal region likely a thrombosed aneurysm sac. LEFT LOWER EXTREMITY Common Femoral Vein: No evidence of thrombus. Normal compressibility, respiratory phasicity and response to augmentation. Saphenofemoral Junction: No evidence of thrombus. Normal compressibility and flow on color Doppler imaging. Profunda Femoral Vein: No evidence of thrombus. Normal compressibility and flow on color Doppler imaging. Femoral Vein: No evidence of thrombus. Normal compressibility, respiratory phasicity and response to augmentation. Popliteal Vein: No evidence of thrombus. Normal compressibility, respiratory phasicity and response to augmentation. Calf Veins: No evidence of thrombus. Normal compressibility and flow on color Doppler imaging. Superficial Great Saphenous Vein: No evidence of thrombus. Normal compressibility. Venous Reflux:  None. 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 Additional masslike structure more proximally within the left thigh which does have some peripheral flow suggesting a partially thrombosed aneurysm. IMPRESSION: 1. Occlusive DVT within the right posterior tibial vein. 2. No evidence of left lower extremity deep vein thrombosis. 3. Large elongated heterogeneous mass-like structure in the posterior left thigh standing to the popliteal fossa, similar in appearance to the prior ultrasound and most compatible with a thrombosed popliteal artery aneurysm sac. Additional masslike structure more proximally within the left thigh does have some peripheral flow suggesting a partially thrombosed aneurysm sac likely involving the  left common femoral artery. 4. Large nonvascular heterogeneous masslike structure which appears to surround a stent in the right popliteal region likely a thrombosed aneurysm sac. 5. Similar findings have been present on the previous exams. Correlate for symptoms of peripheral arterial disease. If further imaging evaluation is clinically warranted, a CTA run-off study could be considered. Electronically Signed   By: Davina Poke D.O.   On: 12/24/2022 19:41     Echo reduced left ventricular function 85%  TELEMETRY: Rhythm with 70:  ASSESSMENT AND PLAN:  Principal Problem:   Severe sepsis with acute organ dysfunction ([place]) Active Problems:   Acute on chronic diastolic (congestive) heart failure (HCC)   Presence of cardiac pacemaker   Sick sinus syndrome (HCC)   Chronic kidney disease, stage 3b (HCC)   Paroxysmal atrial fibrillation (HCC)   Iron deficiency anemia, unspecified   Hyperlipidemia    Plan Continue broad-spectrum antibiotic therapy for sepsis Rate control for atrial fibrillation History of permanent pacemaker in place appears to be functioning adequately Statin therapy for hyperlipidemia Consider diuretic therapy for acute on chronic diastolic dysfunction No invasive diagnostic cardiac procedures recommended   Yolonda Kida, MD 12/25/2022 7:59 PM       Yolonda Kida, MD 12/25/2022 7:59 PM

## 2022-12-25 NOTE — Progress Notes (Signed)
OT Cancellation Note  Patient Details Name: RIELEY HAUSMAN MRN: [id number] DOB: [birth year]   Cancelled Treatment:    Reason Eval/Treat Not Completed: Medical issues which prohibited therapy. Chart reviewed.  Pt noted to have new LE DVT with MD recommending to hold PT/OT evaluations at this time. Will f/u once medically appropriate.    Dessie Coma, M.S. OTR/L  12/25/22, 1:54 PM  ascom [phone]

## 2022-12-26 ENCOUNTER — Inpatient Hospital Stay: Payer: Medicare HMO

## 2022-12-26 ENCOUNTER — Other Ambulatory Visit (HOSPITAL_COMMUNITY): Payer: Self-pay

## 2022-12-26 DIAGNOSIS — R6520 Severe sepsis without septic shock: Secondary | ICD-10-CM | POA: Diagnosis not present

## 2022-12-26 DIAGNOSIS — A419 Sepsis, unspecified organism: Secondary | ICD-10-CM | POA: Diagnosis not present

## 2022-12-26 LAB — CULTURE, BLOOD (ROUTINE X 2)
Culture: NO GROWTH
Culture: NO GROWTH
Special Requests: ADEQUATE
Special Requests: ADEQUATE

## 2022-12-26 LAB — GLUCOSE, CAPILLARY
Glucose-Capillary: 110 mg/dL — ABNORMAL HIGH (ref 70–99)
Glucose-Capillary: 126 mg/dL — ABNORMAL HIGH (ref 70–99)
Glucose-Capillary: 128 mg/dL — ABNORMAL HIGH (ref 70–99)
Glucose-Capillary: 144 mg/dL — ABNORMAL HIGH (ref 70–99)
Glucose-Capillary: 153 mg/dL — ABNORMAL HIGH (ref 70–99)
Glucose-Capillary: 87 mg/dL (ref 70–99)
Glucose-Capillary: 98 mg/dL (ref 70–99)

## 2022-12-26 LAB — BASIC METABOLIC PANEL
Anion gap: 7 (ref 5–15)
BUN: 46 mg/dL — ABNORMAL HIGH (ref 8–23)
CO2: 21 mmol/L — ABNORMAL LOW (ref 22–32)
Calcium: 7.9 mg/dL — ABNORMAL LOW (ref 8.9–10.3)
Chloride: 109 mmol/L (ref 98–111)
Creatinine, Ser: 2.67 mg/dL — ABNORMAL HIGH (ref 0.61–1.24)
GFR, Estimated: 21 mL/min — ABNORMAL LOW (ref >60)
Glucose, Bld: 120 mg/dL — ABNORMAL HIGH (ref 70–99)
Potassium: 3.6 mmol/L (ref 3.5–5.1)
Sodium: 137 mmol/L (ref 135–145)

## 2022-12-26 LAB — PHOSPHORUS: Phosphorus: 2.4 mg/dL — ABNORMAL LOW (ref 2.5–4.6)

## 2022-12-26 LAB — MAGNESIUM: Magnesium: 2 mg/dL (ref 1.7–2.4)

## 2022-12-26 LAB — CBC
HCT: 27.4 % — ABNORMAL LOW (ref 39.0–52.0)
Hemoglobin: 8.9 g/dL — ABNORMAL LOW (ref 13.0–17.0)
MCH: 29.1 pg (ref 26.0–34.0)
MCHC: 32.5 g/dL (ref 30.0–36.0)
MCV: 89.5 fL (ref 80.0–100.0)
Platelets: 162 10*3/uL (ref 150–400)
RBC: 3.06 MIL/uL — ABNORMAL LOW (ref 4.22–5.81)
RDW: 15.5 % (ref 11.5–15.5)
WBC: 14 10*3/uL — ABNORMAL HIGH (ref 4.0–10.5)
nRBC: 0 % (ref 0.0–0.2)

## 2022-12-26 MED ORDER — SODIUM BICARBONATE 650 MG PO TABS
650.0000 mg | ORAL_TABLET | Freq: Three times a day (TID) | ORAL | Status: DC
  Administered 2022-12-26 – 2022-12-31 (×16): 650 mg via ORAL
  Filled 2022-12-26 (×17): qty 1

## 2022-12-26 MED ORDER — POLYETHYLENE GLYCOL 3350 17 G PO PACK
17.0000 g | PACK | Freq: Once | ORAL | Status: AC
  Administered 2022-12-26: 17 g via ORAL
  Filled 2022-12-26: qty 1

## 2022-12-26 MED ORDER — AZITHROMYCIN 250 MG PO TABS
500.0000 mg | ORAL_TABLET | Freq: Every day | ORAL | Status: AC
  Administered 2022-12-26 – 2022-12-30 (×5): 500 mg via ORAL
  Filled 2022-12-26 (×5): qty 2

## 2022-12-26 NOTE — Progress Notes (Signed)
[facility] Cardiology    SUBJECTIVE: Patient states she prefers to be well improved shortness of breath denies any chest pain no fever resting comfortably in bed   Vitals:   12/26/22 0003 12/26/22 0342 12/26/22 0752 12/26/22 1256  BP: (!) 114/46 107/61 (!) 135/58 (!) 126/47  Pulse: 79 91 79 78  Resp: 17 17 16 16   Temp: 98.8 F (37.1 C) 98.9 F (37.2 C) 99.5 F (37.5 C) 98.5 F (36.9 C)  TempSrc:   Oral Oral  SpO2: 100% 100% 100% 100%  Weight:      Height:         Intake/Output Summary (Last 24 hours) at 12/26/2022 1636 Last data filed at 12/26/2022 1507 Gross per 24 hour  Intake 546.25 ml  Output 1000 ml  Net -453.75 ml      PHYSICAL EXAM  General: Well developed, well nourished, in no acute distress HEENT:  Normocephalic and atramatic Neck:  No JVD.  Lungs: Clear bilaterally to auscultation and percussion. Heart: HRRR . Normal S1 and S2 without gallops or murmurs.  Abdomen: Bowel sounds are positive, abdomen soft and non-tender  Msk:  Back normal, normal gait. Normal strength and tone for age. Extremities: No clubbing, cyanosis or edema.   Neuro: Alert and oriented X 3. Psych:  Good affect, responds appropriately   LABS: Basic Metabolic Panel: Recent Labs    12/25/22 0500 12/26/22 0639  NA 139 137  K 3.5 3.6  CL 111 109  CO2 17* 21*  GLUCOSE 92 120*  BUN 43* 46*  CREATININE 2.52* 2.67*  CALCIUM 7.8* 7.9*  MG 1.9 2.0  PHOS 2.8 2.4*   Liver Function Tests: No results for input(s): "AST", "ALT", "ALKPHOS", "BILITOT", "PROT", "ALBUMIN" in the last 72 hours. No results for input(s): "LIPASE", "AMYLASE" in the last 72 hours. CBC: Recent Labs    12/25/22 0500 12/26/22 0639  WBC 12.9* 14.0*  HGB 8.6* 8.9*  HCT 26.5* 27.4*  MCV 90.8 89.5  PLT 159 162   Cardiac Enzymes: No results for input(s): "CKTOTAL", "CKMB", "CKMBINDEX", "TROPONINI" in the last 72 hours. BNP: Invalid input(s): "POCBNP" D-Dimer: No results for input(s): "DDIMER" in the last  72 hours. Hemoglobin A1C: No results for input(s): "HGBA1C" in the last 72 hours. Fasting Lipid Panel: No results for input(s): "CHOL", "HDL", "LDLCALC", "TRIG", "CHOLHDL", "LDLDIRECT" in the last 72 hours. Thyroid Function Tests: No results for input(s): "TSH", "T4TOTAL", "T3FREE", "THYROIDAB" in the last 72 hours.  Invalid input(s): "FREET3" Anemia Panel: No results for input(s): "VITAMINB12", "FOLATE", "FERRITIN", "TIBC", "IRON", "RETICCTPCT" in the last 72 hours.  DG Chest Port 1 View  Result Date: 12/26/2022 CLINICAL DATA:  Shortness of breath, sepsis and hypoxia. EXAM: PORTABLE CHEST 1 VIEW COMPARISON:  12/21/2022 FINDINGS: Stable cardiac enlargement and appearance of pacemaker. Stable chronic lung disease and bibasilar scarring/atelectasis. No overt pulmonary consolidation, edema or pleural fluid identified. No pneumothorax. IMPRESSION: Stable cardiac enlargement and chronic lung disease. No acute findings. Electronically Signed   By: Aletta Edouard M.D.   On: 12/26/2022 10:34   US Venous Img Lower Bilateral (DVT)  Result Date: 12/24/2022 .  CLINICAL DATA: Bilateral lower extremity swelling history of left popliteal artery aneurysm and prior embolization EXAM: BILATERAL LOWER EXTREMITY VENOUS DOPPLER ULTRASOUND TECHNIQUE: Gray-scale sonography with graded compression, as well as color Doppler and duplex ultrasound were performed to evaluate the lower extremity deep venous systems from the level of the common femoral vein and including the common femoral, femoral, profunda femoral, popliteal and  calf veins including the posterior tibial, peroneal and gastrocnemius veins when visible. The superficial great saphenous vein was also interrogated. Spectral Doppler was utilized to evaluate flow at rest and with distal augmentation maneuvers in the common femoral, femoral and popliteal veins. COMPARISON:  01/21/2021 FINDINGS: RIGHT LOWER EXTREMITY Common Femoral Vein: No evidence of thrombus.  Normal compressibility, respiratory phasicity and response to augmentation. Saphenofemoral Junction: No evidence of thrombus. Normal compressibility and flow on color Doppler imaging. Profunda Femoral Vein: No evidence of thrombus. Normal compressibility and flow on color Doppler imaging. Femoral Vein: No evidence of thrombus. Normal compressibility, respiratory phasicity and response to augmentation. Popliteal Vein: No evidence of thrombus. Normal compressibility, respiratory phasicity and response to augmentation. Calf Veins: Occlusive echogenic thrombus within the right posterior tibial vein. Superficial Great Saphenous Vein: No evidence of thrombus. Normal compressibility. Venous Reflux:  None. Other Findings: Large nonvascular heterogeneous masslike structure which appears to surround a stent in the right popliteal region likely a thrombosed aneurysm sac. LEFT LOWER EXTREMITY Common Femoral Vein: No evidence of thrombus. Normal compressibility, respiratory phasicity and response to augmentation. Saphenofemoral Junction: No evidence of thrombus. Normal compressibility and flow on color Doppler imaging. Profunda Femoral Vein: No evidence of thrombus. Normal compressibility and flow on color Doppler imaging. Femoral Vein: No evidence of thrombus. Normal compressibility, respiratory phasicity and response to augmentation. Popliteal Vein: No evidence of thrombus. Normal compressibility, respiratory phasicity and response to augmentation. Calf Veins: No evidence of thrombus. Normal compressibility and flow on color Doppler imaging. Superficial Great Saphenous Vein: No evidence of thrombus. Normal compressibility. Venous Reflux:  None. 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 Additional masslike structure more  proximally within the left thigh which does have some peripheral flow suggesting a partially thrombosed aneurysm. IMPRESSION: 1. Occlusive DVT within the right posterior tibial vein. 2. No evidence of left lower extremity deep vein thrombosis. 3. Large elongated heterogeneous mass-like structure in the posterior left thigh standing to the popliteal fossa, similar in appearance to the prior ultrasound and most compatible with a thrombosed popliteal artery aneurysm sac. Additional masslike structure more proximally within the left thigh does have some peripheral flow suggesting a partially thrombosed aneurysm sac likely involving the left common femoral artery. 4. Large nonvascular heterogeneous masslike structure which appears to surround a stent in the right popliteal region likely a thrombosed aneurysm sac. 5. Similar findings have been present on the previous exams. Correlate for symptoms of peripheral arterial disease. If further imaging evaluation is clinically warranted, a CTA run-off study could be considered. Electronically Signed   By: Duanne Guess D.O.   On: 12/24/2022 19:41     Echo pending  TELEMETRY: Paced rhythm at 70:  ASSESSMENT AND PLAN:  Principal Problem:   Severe sepsis with acute organ dysfunction (HCC) Active Problems:   Acute on chronic diastolic (congestive) heart failure (HCC)   Presence of cardiac pacemaker   Sick sinus syndrome (HCC)   Chronic kidney disease, stage 3b (HCC)   Paroxysmal atrial fibrillation (HCC)   Iron deficiency anemia, unspecified   Hyperlipidemia    Plan Severe sepsis with endorgan disease function continue current medications continue Sick sinus syndrome permanent pacemaker in place continue current therapy Chronic renal insufficiency maintain adequate hydration follow-up with nephrology Hyperlipidemia continue lipid management Permanent pacemaker in place appears to be functioning adequately Continue conservative cardiac input  at this  stage    Yolonda Kida, MD 12/26/2022 4:36 PM

## 2022-12-26 NOTE — Evaluation (Signed)
Occupational Therapy Evaluation Patient Details Name: Devin Washington MRN: [id number] DOB: 1923/08/07 Today's Date: 12/26/2022   History of Present Illness Pt is a 89 y/o M admitted on 12/21/22 after presenting with acute hypoxic repsiratory failure & placed on BiPAP. Work up revealed sepsis of unknown source. Pt also found to have DVT. PMH: a-fib, HTN, chronic renal sufficiency, anemia, SSS, permanent pacemaker, HLD, DM, aortic stenosis   Clinical Impression   Mr Seldon was seen for OT/PT co-evaluation this date. Prior to hospital admission, pt was MOD I using RW for ADLs and mobility. Pt lives alone, no family available to assist. Pt presents to acute OT demonstrating impaired ADL performance and functional mobility 2/2 decreased activity tolerance and functional strength/ROM/balance deficits. Pt currently requires SUP bed mobility. MIN A + RW sit<>stand, endorses dizziness and returns to sitting. MIN A + RW x2 assist for lines bed>chair step pivot t/f. Pt would benefit from skilled OT to address noted impairments and functional limitations (see below for any additional details). Upon hospital discharge, recommend STR to maximize pt safety and return to PLOF.    Recommendations for follow up therapy are one component of a multi-disciplinary discharge planning process, led by the attending physician.  Recommendations may be updated based on patient status, additional functional criteria and insurance authorization.   Follow Up Recommendations  Skilled nursing-short term rehab (<3 hours/day)     Assistance Recommended at Discharge Intermittent Supervision/Assistance  Patient can return home with the following A lot of help with walking and/or transfers;A lot of help with bathing/dressing/bathroom    Functional Status Assessment  Patient has had a recent decline in their functional status and demonstrates the ability to make significant improvements in function in a reasonable and predictable  amount of time.  Equipment Recommendations  Other (comment) (defer)    Recommendations for Other Services       Precautions / Restrictions Precautions Precautions: Fall Restrictions Weight Bearing Restrictions: No      Mobility Bed Mobility Overal bed mobility: Needs Assistance Bed Mobility: Supine to Sit     Supine to sit: Supervision, HOB elevated          Transfers Overall transfer level: Needs assistance Equipment used: Rolling walker (2 wheels) Transfers: Sit to/from Stand, Bed to chair/wheelchair/BSC Sit to Stand: Min assist     Step pivot transfers: Min assist, +2 safety/equipment            Balance Overall balance assessment: Needs assistance Sitting-balance support: Feet supported, No upper extremity supported Sitting balance-Leahy Scale: Fair     Standing balance support: Bilateral upper extremity supported, During functional activity, Reliant on assistive device for balance Standing balance-Leahy Scale: Poor Standing balance comment: limited by dizziness                           ADL either performed or assessed with clinical judgement   ADL Overall ADL's : Needs assistance/impaired                                       General ADL Comments: MIN A for LB access seated EOB. MIN A + RW for simulated BSC t/f.      Pertinent Vitals/Pain Pain Assessment Pain Assessment: No/denies pain     Hand Dominance     Extremity/Trunk Assessment Upper Extremity Assessment Upper Extremity Assessment: Generalized weakness   Lower Extremity  Assessment Lower Extremity Assessment: Generalized weakness   Cervical / Trunk Assessment Cervical / Trunk Assessment: Kyphotic   Communication Communication Communication: HOH   Cognition Arousal/Alertness: Awake/alert Behavior During Therapy: WFL for tasks assessed/performed Overall Cognitive Status: Within Functional Limits for tasks assessed                                  General Comments: Possibly some cognitive deficits, as pt initially reports someone was assisting him with getting groceries, then later states he was driving to the store & getting them himself.     General Comments  c/o SOB and dizziness            Home Living Family/patient expects to be discharged to:: Private residence Living Arrangements: Alone Available Help at Discharge: Family;Neighbor;Available PRN/intermittently Type of Home: House Home Access: Stairs to enter [employer] of Steps: 2 Entrance Stairs-Rails: Right;Left Home Layout: One level     Bathroom Shower/Tub: Runner, broadcasting/film/video: Shower seat          Prior Functioning/Environment Prior Level of Function : Independent/Modified Independent             Mobility Comments: Pt reports he was mod I with RW, ambulatory, driving. ADLs Comments: Pt reports he was able to bathe & dress himself. Initially reports someone assists him with getting groceries then later states he was driving to the store to get them himself.        OT Problem List: Decreased strength;Decreased range of motion;Decreased activity tolerance;Impaired balance (sitting and/or standing);Decreased safety awareness      OT Treatment/Interventions: Self-care/ADL training;Therapeutic exercise;Energy conservation;DME and/or AE instruction;Therapeutic activities;Patient/family education;Balance training    OT Goals(Current goals can be found in the care plan section) Acute Rehab OT Goals Patient Stated Goal: to go to rehab OT Goal Formulation: With patient Time For Goal Achievement: 01/09/23 Potential to Achieve Goals: Good ADL Goals Pt Will Perform Grooming: with modified independence;standing Pt Will Perform Lower Body Dressing: with modified independence;sit to/from stand Pt Will Transfer to Toilet: with modified independence;ambulating;regular height toilet  OT Frequency: Min 2X/week     Co-evaluation PT/OT/SLP Co-Evaluation/Treatment: Yes Reason for Co-Treatment: For patient/therapist safety;To address functional/ADL transfers PT goals addressed during session: Mobility/safety with mobility OT goals addressed during session: ADL's and self-care      AM-PAC OT "6 Clicks" Daily Activity     Outcome Measure Help from another person eating meals?: None Help from another person taking care of personal grooming?: A Lot Help from another person toileting, which includes using toliet, bedpan, or urinal?: A Lot Help from another person bathing (including washing, rinsing, drying)?: A Lot Help from another person to put on and taking off regular upper body clothing?: A Little Help from another person to put on and taking off regular lower body clothing?: A Little 6 Click Score: 16   End of Session Nurse Communication: Mobility status  Activity Tolerance: Patient tolerated treatment well Patient left: in chair;with call bell/phone within reach;with chair alarm set;with nursing/sitter in room  OT Visit Diagnosis: Other abnormalities of gait and mobility (R26.89);Muscle weakness (generalized) (M62.81)                Time: 6213-0865 OT Time Calculation (min): 18 min Charges:  OT General Charges $OT Visit: 1 Visit OT Evaluation $OT Eval Moderate Complexity: 1 Mod  Kathie Dike, M.S. OTR/L  12/26/22, 12:59  PM  ascom [phone]

## 2022-12-26 NOTE — Progress Notes (Signed)
[facility] Progress Note  Patient: Devin Washington    VOZ:[id number]  DOA: 12/21/2022     Date of Service: the patient was seen and examined on 12/26/2022  Chief Complaint  Patient presents with   Respiratory Distress   Brief hospital course:  Devin Washington is a 89 y.o. male with medical history significant of atrial fibrillation, essential hypertension, chronic kidney disease stage IIIb, iron deficiency anemia, sick sinus syndrome status post pacemaker placement, hyperlipidemia, essential hypertension, chronic diastolic heart failure who was initially brought into the ER as a Devin Washington.  Patient was obtained there was significant hypoxic respiratory failure.  He is currently unable to give any history and is on BiPAP.  Patient arrived with oxygen sat of 62% on room air.  He was in respiratory distress on CPAP from EMS.  Workup so far showed severe sepsis with lactic acid of more than 9 and pH of 7.18.  Vitals are however stable.  Patient has chest x-ray that is negative.  Acute viral screen also negative.  No obvious source of sepsis.  At this point he is being admitted with acute hypoxic respiratory failure as well as severe sepsis of unknown source.  Workup is ongoing to determine the source.    Assessment and Plan:  # Severe sepsis with endorgan damage: Patient severely hypoxic although no evidence of pneumonia on chest x-ray.   CT C/a/p: Peripheral reticulations with bibasilar subpleural cystic changes suggestive of interstitial lung disease/pulmonary fibrosis. Urinalysis was not very impressive, urine culture multiple species, contamination Continue cefepime  (d/c'd Vanc & Flagyl) S/p vancomycin DC'd on 1/9, MRSA PCR negative, blood culture negative, WBC improving.  Procalcitonin 22 elevated 1/12 CXR: Stable cardiac enlargement and chronic lung disease. No acute findings. 1/12 discontinued Flagyl, started empiric azithromycin 500 mg p.o. daily for 5 days Trend WBC count    Right  lower extremity DVT Duplex shows DVT in the posterior tibial vein Vascular surgery consulted, recommended Eliquis 2.5 milligram p.o. twice daily Chronic thrombosis left lower extremity aneurysm, surgery recommended no intervention, got opinion via secure chat, no official consult done   # Acute hypoxic respiratory failure with COPD exacerbation S/p BiPAP.  Continue antibiotics treatment and titrate of BiPAP S/p IV Solu-Medrol, discontinued steroids on 1/9 due to low hemoglobin, possible GI bleed.  Patient's respiratory failure resolved, currently saturating well on room air. Started Breo Ellipta inhaler, DuoNeb every 6 hourly scheduled followed by as needed Started Mucinex extremity gram p.o. twice daily and Tussionex for cough as needed   # Elevated troponin, type II MI most likely due to demand ischemia Troponin 189----476  continue to trend Continue to monitor on telemetry TTE shows LVEF 40 to 42%, grade 1 diastolic dysfunction, LV demonstrate regional wall motion abnormality EKG shows paced rhythm Patient remained chest pain-free Cardiology consulted, recommended no invasive cardiac procedure at this stage Started aspirin    # Anemia due to iron deficiency, monitor H&H Iron level 19, transferrin saturation 59% Folic acid 9.5 WNL Start oral iron supplement when stable, avoid IV iron due to active infection 1/9, Hb 6.4, transfuse 1 unit of PRBC 1/10 Hb 8.9 stable Pantoprazole 40 mg IV twice daily Check FOBT  # Metabolic acidosis CO2 56--38--75--64  S/p Sodium bicarbonate IV push given,  and s/p sodium bicarbonate IV infusion till 1/12, transition to oral bicarbonate Monitor BMP daily   # Chronic kidney disease stage III: Hydrate.  Monitor renal function Creatinine 3.75--3.2--2.6--2.5 Monitor renal foods daily, monitor urine output Avoid  nephrotoxic medications, use renal dose medications   # Hypomagnesemia, mag repleted.  Resolved Monitor and replete as needed.   #  severe lactic acidosis: Aggressively hydrate.  Continue antibiotics Lactic acidosis resolved, lactic acid 1.6 within normal range now. # sick sinus syndrome: Status post pacemaker placement.  Rate is now controlled.   # paroxysmal atrial fibrillation: Rate is controlled.  Home regimen.   # acute metabolic encephalopathy: Secondary to severe sepsis.  Resolved, patient is AOx3   # Pulmonary nodule, incidental finding on CT scan CT chest: Lingular subpleural 1.3 x 1 cm nodule. Consider one of the following in 3 months for both low-risk and high-risk individuals: (a) repeat chest CT, (b) follow-up PET-CT, or (c) tissue sampling. Patient was recommended to follow with PCP and pulmonary as an outpatient  PAD, abdominal aortic aneurysm CT A/P: Stable aneurysmal bilateral common iliac arteries. 6. Stable aneurysmal infrarenal abdominal aorta (3.4 cm). Recommend annual imaging followup by CTA or MRA.  Patient was recommended to follow with PCP for incidental findings as above  Vitamin D insufficiency: started vitamin D 50,000 units p.o. weekly, follow with PCP to repeat vitamin D level after 3 to 6 months.  Body mass index is 21.81 kg/m.  Interventions:    PT and OT eval done, recommend SNF placement     Diet: Heart healthy diet DVT Prophylaxis: Subcutaneous Heparin    Advance goals of care discussion: DNR  Family Communication: family was present at bedside, at the time of interview.  The pt provided permission to discuss medical plan with the family. Opportunity was given to ask question and all questions were answered satisfactorily.   Disposition:  Pt is from Home, admitted with sepsis, still has sepsis, which precludes a safe discharge. Discharge to SNF, when clinically stable, may need 1-2 more days to improve.  Subjective: Patient stated that overnight he was very short of breath and did not have a good night but in the morning time he is feeling fine and he was sleepy and  resting comfortably.  Still has mild cough, no any other active issues, no chest pain or palpitation, no GI bleeding.    Physical Exam: General: lying comfortably, Mild distress and SOB Appear in no distress, affect appropriate Eyes: PERRLA ENT: Oral Mucosa Clear, moist  Neck: no JVD,  Cardiovascular: S1 and S2 Present, no Murmur,  Respiratory: good respiratory effort, Bilateral Air entry equal and Decreased, mild Crackles and mild wheezing bilaterally Abdomen: Bowel Sound present, Soft and no tenderness,  Skin: no rashes Extremities: RLE no Pedal edema, no calf tenderness, left calf thrombosed chronic aneurysm Neurologic: without any new focal findings Gait not checked due to patient safety concerns  Vitals:   12/26/22 0003 12/26/22 0342 12/26/22 0752 12/26/22 1256  BP: (!) 114/46 107/61 (!) 135/58 (!) 126/47  Pulse: 79 91 79 78  Resp: 17 17 16 16   Temp: 98.8 F (37.1 C) 98.9 F (37.2 C) 99.5 F (37.5 C) 98.5 F (36.9 C)  TempSrc:   Oral Oral  SpO2: 100% 100% 100% 100%  Weight:      Height:        Intake/Output Summary (Last 24 hours) at 12/26/2022 1627 Last data filed at 12/26/2022 1507 Gross per 24 hour  Intake 546.25 ml  Output 1000 ml  Net -453.75 ml   Filed Weights   12/22/22 0019  Weight: 68.9 kg    Data Reviewed: I have personally reviewed and interpreted daily labs, tele strips, imagings as discussed above. I  reviewed all nursing notes, pharmacy notes, vitals, pertinent old records I have discussed plan of care as described above with RN and patient/family.  CBC: Recent Labs  Lab 12/21/22 1854 12/22/22 0350 12/23/22 0442 12/24/22 0420 12/25/22 0500 12/26/22 0639  WBC 16.8* 17.4* 14.4* 16.8* 12.9* 14.0*  NEUTROABS 14.8*  --   --   --   --   --   HGB 8.9* 7.3* 6.4* 8.9* 8.6* 8.9*  HCT 29.0* 23.0* 20.7* 27.5* 26.5* 27.4*  MCV 95.1 93.1 94.1 91.1 90.8 89.5  PLT 279 188 146* 178 159 162   Basic Metabolic Panel: Recent Labs  Lab 12/22/22 0350  12/23/22 0442 12/24/22 0420 12/25/22 0500 12/26/22 0639  NA 138 135 138 139 137  K 3.8 4.0 3.7 3.5 3.6  CL 111 110 109 111 109  CO2 18* 15* 21* 17* 21*  GLUCOSE 147* 109* 157* 92 120*  BUN 61* 44* 48* 43* 46*  CREATININE 3.20* 2.36* 2.60* 2.52* 2.67*  CALCIUM 8.0* 8.2* 8.3* 7.8* 7.9*  MG 1.8 1.5* 2.1 1.9 2.0  PHOS 4.9* 3.9 3.3 2.8 2.4*    Studies: DG Chest Port 1 View  Result Date: 12/26/2022 CLINICAL DATA:  Shortness of breath, sepsis and hypoxia. EXAM: PORTABLE CHEST 1 VIEW COMPARISON:  12/21/2022 FINDINGS: Stable cardiac enlargement and appearance of pacemaker. Stable chronic lung disease and bibasilar scarring/atelectasis. No overt pulmonary consolidation, edema or pleural fluid identified. No pneumothorax. IMPRESSION: Stable cardiac enlargement and chronic lung disease. No acute findings. Electronically Signed   By: Irish Lack M.D.   On: 12/26/2022 10:34    Scheduled Meds:  sodium chloride   Intravenous Once   amLODipine  10 mg Oral Daily   apixaban  2.5 mg Oral BID   vitamin C  500 mg Oral Daily   aspirin EC  81 mg Oral Daily   atorvastatin  20 mg Oral Daily   azithromycin  500 mg Oral Daily   bisacodyl  10 mg Oral QHS   fluticasone furoate-vilanterol  1 puff Inhalation Daily   guaiFENesin  600 mg Oral BID   insulin aspart  0-9 Units Subcutaneous Q4H   iron polysaccharides  150 mg Oral Daily   metoprolol tartrate  25 mg Oral BID   pantoprazole (PROTONIX) IV  40 mg Intravenous Q12H   sodium bicarbonate  650 mg Oral TID   traZODone  50 mg Oral QHS   Vitamin D (Ergocalciferol)  50,000 Units Oral Q7 days   Continuous Infusions:  ceFEPime (MAXIPIME) IV Stopped (12/26/22 0032)   PRN Meds: acetaminophen **OR** acetaminophen, albuterol, chlorpheniramine-HYDROcodone, ipratropium-albuterol, ondansetron **OR** ondansetron (ZOFRAN) IV  Time spent: 50 minutes  Author: Gillis Santa. MD [facility] 12/26/2022 4:27 PM  To reach On-call, see care teams to locate the  attending and reach out to them via [email or web address]. If 7PM-7AM, please contact night-coverage If you still have difficulty reaching the attending provider, please page the [facility] (Director on Call) for [facility] on amion for assistance.

## 2022-12-26 NOTE — Evaluation (Signed)
Physical Therapy Evaluation Patient Details Name: Devin Washington MRN: [id number] DOB: 20-Jan-1923 Today's Date: 12/26/2022  History of Present Illness  Pt is a 89 y/o M admitted on 12/21/22 after presenting with acute hypoxic repsiratory failure & placed on BiPAP. Work up revealed sepsis of unknown source. Pt also found to have DVT. PMH: a-fib, HTN, chronic renal sufficiency, anemia, SSS, permanent pacemaker, HLD, DM, aortic stenosis  Clinical Impression  Pt seen for PT evaluation with co-tx with OT for pt & therapists' safety. Pt reports prior to admission he was living alone in a 1 level home with 2 steps with B rails to enter & was ambulatory with RW. On this date, pt attempts to stand at sink to perform grooming tasks but pt too fatigued after stepping forwards to sink. Pt requires seated rest break prior to transferring to recliner with min assist & RW. Pt c/o SOB & but lowest SpO2 was 89%, otherwise >90%. At this time, pt is unsafe to d/c home alone & would benefit from STR upon d/c to maximize independence with functional mobility & reduce fall risk prior to return home.  Pt with c/o slight dizziness with mobility, BP in LUE sitting in recliner 121/67 mmHg MAP 83, HR 99 bpm.      Recommendations for follow up therapy are one component of a multi-disciplinary discharge planning process, led by the attending physician.  Recommendations may be updated based on patient status, additional functional criteria and insurance authorization.  Follow Up Recommendations Skilled nursing-short term rehab (<3 hours/day) Can patient physically be transported by private vehicle: No    Assistance Recommended at Discharge Frequent or constant Supervision/Assistance  Patient can return home with the following  Assist for transportation;Help with stairs or ramp for entrance;A little help with walking and/or transfers;Assistance with cooking/housework    Equipment Recommendations None recommended by PT   Recommendations for Other Services       Functional Status Assessment Patient has had a recent decline in their functional status and demonstrates the ability to make significant improvements in function in a reasonable and predictable amount of time.     Precautions / Restrictions Precautions Precautions: Fall Restrictions Weight Bearing Restrictions: No      Mobility  Bed Mobility Overal bed mobility: Needs Assistance Bed Mobility: Supine to Sit     Supine to sit: Supervision, HOB elevated     General bed mobility comments: use of bed rails, extra time    Transfers Overall transfer level: Needs assistance Equipment used: Rolling walker (2 wheels) Transfers: Sit to/from Stand, Bed to chair/wheelchair/BSC Sit to Stand: Min assist, +2 physical assistance   Step pivot transfers: Min assist, Min guard, +2 physical assistance       General transfer comment: STS from EOB with min assist +2, fade to min assist +1. PT provides cuing for safe hand placement with improvement in STS with return demo. Pt does lean posteriorly on EOB with BLE.    Ambulation/Gait Ambulation/Gait assistance: Min guard, Min assist                Stairs            Wheelchair Mobility    Modified Rankin (Stroke Patients Only)       Balance Overall balance assessment: Needs assistance Sitting-balance support: Feet supported, Bilateral upper extremity supported Sitting balance-Leahy Scale: Fair     Standing balance support: Bilateral upper extremity supported, During functional activity, Reliant on assistive device for balance Standing balance-Leahy Scale: Poor  Pertinent Vitals/Pain Pain Assessment Pain Assessment: No/denies pain    Home Living Family/patient expects to be discharged to:: Private residence Living Arrangements: Alone Available Help at Discharge: Family;Neighbor;Available PRN/intermittently Type of Home:  House Home Access: Stairs to enter Entrance Stairs-Rails: Doctor, general practice of Steps: 2   Home Layout: One level Home Equipment: Shower seat      Prior Function Prior Level of Function : Independent/Modified Independent             Mobility Comments: Pt reports he was mod I with RW, ambulatory, driving. ADLs Comments: Pt reports he was able to bathe & dress himself. Initially reports someone assists him with getting groceries then later states he was driving to the store to get them himself.     Hand Dominance        Extremity/Trunk Assessment   Upper Extremity Assessment Upper Extremity Assessment: Generalized weakness    Lower Extremity Assessment Lower Extremity Assessment: Generalized weakness    Cervical / Trunk Assessment Cervical / Trunk Assessment: Kyphotic  Communication   Communication: HOH  Cognition Arousal/Alertness: Awake/alert Behavior During Therapy: WFL for tasks assessed/performed Overall Cognitive Status: Within Functional Limits for tasks assessed                                 General Comments: Possibly some cognitive deficits, as pt initially reports someone was assisting him with getting groceries, then later states he was driving to the store & getting them himself.        General Comments General comments (skin integrity, edema, etc.): Pt c/o SOB with mobility but lowest SPO2 was 89% & question accuracy of this, after transferring to recliner SPO2 99%.    Exercises     Assessment/Plan    PT Assessment Patient needs continued PT services  PT Problem List Decreased strength;Cardiopulmonary status limiting activity;Decreased activity tolerance;Decreased knowledge of use of DME;Decreased balance;Decreased mobility       PT Treatment Interventions DME instruction;Therapeutic exercise;Gait training;Balance training;Stair training;Functional mobility training;Therapeutic activities;Patient/family  education;Neuromuscular re-education    PT Goals (Current goals can be found in the Care Plan section)  Acute Rehab PT Goals Patient Stated Goal: get better, stronger PT Goal Formulation: With patient Time For Goal Achievement: 01/09/23 Potential to Achieve Goals: Good    Frequency Min 2X/week     Co-evaluation PT/OT/SLP Co-Evaluation/Treatment: Yes Reason for Co-Treatment: For patient/therapist safety;To address functional/ADL transfers PT goals addressed during session: Mobility/safety with mobility OT goals addressed during session: ADL's and self-care       AM-PAC PT "6 Clicks" Mobility  Outcome Measure Help needed turning from your back to your side while in a flat bed without using bedrails?: A Little Help needed moving from lying on your back to sitting on the side of a flat bed without using bedrails?: A Little Help needed moving to and from a bed to a chair (including a wheelchair)?: A Little Help needed standing up from a chair using your arms (e.g., wheelchair or bedside chair)?: A Little Help needed to walk in hospital room?: A Lot Help needed climbing 3-5 steps with a railing? : Total 6 Click Score: 15    End of Session   Activity Tolerance: Patient limited by fatigue Patient left: in chair;with chair alarm set;with call bell/phone within reach Nurse Communication: Mobility status PT Visit Diagnosis: Muscle weakness (generalized) (M62.81);Difficulty in walking, not elsewhere classified (R26.2);Unsteadiness on feet (R26.81)    Time: 8657-8469  PT Time Calculation (min) (ACUTE ONLY): 19 min   Charges:   PT Evaluation $PT Eval Moderate Complexity: 1 Mod          Aleda Grana, PT, DPT 12/26/22, 12:58 PM   Sandi Mariscal 12/26/2022, 12:56 PM

## 2022-12-26 NOTE — TOC Benefit Eligibility Note (Addendum)
Patient Teacher, English as a foreign language completed.    The patient is currently admitted and upon discharge could be taking Eliquis 2.5 mg.  The current 30 day co-pay is $30.00.   The patient is insured through [employer], [place] Patient Advocate Specialist [place] Patient Advocate Team Direct Number: [phone]  Fax: [phone]

## 2022-12-27 DIAGNOSIS — I5033 Acute on chronic diastolic (congestive) heart failure: Secondary | ICD-10-CM

## 2022-12-27 DIAGNOSIS — N1832 Chronic kidney disease, stage 3b: Secondary | ICD-10-CM

## 2022-12-27 DIAGNOSIS — A419 Sepsis, unspecified organism: Secondary | ICD-10-CM | POA: Diagnosis not present

## 2022-12-27 DIAGNOSIS — I495 Sick sinus syndrome: Secondary | ICD-10-CM

## 2022-12-27 LAB — GLUCOSE, CAPILLARY
Glucose-Capillary: 122 mg/dL — ABNORMAL HIGH (ref 70–99)
Glucose-Capillary: 85 mg/dL (ref 70–99)
Glucose-Capillary: 154 mg/dL — ABNORMAL HIGH (ref 70–99)
Glucose-Capillary: 101 mg/dL — ABNORMAL HIGH (ref 70–99)
Glucose-Capillary: 156 mg/dL — ABNORMAL HIGH (ref 70–99)

## 2022-12-27 LAB — CBC
HCT: 27.2 % — ABNORMAL LOW (ref 39.0–52.0)
Hemoglobin: 8.8 g/dL — ABNORMAL LOW (ref 13.0–17.0)
MCH: 29 pg (ref 26.0–34.0)
MCHC: 32.4 g/dL (ref 30.0–36.0)
MCV: 89.8 fL (ref 80.0–100.0)
Platelets: 176 10*3/uL (ref 150–400)
RBC: 3.03 MIL/uL — ABNORMAL LOW (ref 4.22–5.81)
RDW: 15.6 % — ABNORMAL HIGH (ref 11.5–15.5)
WBC: 15.4 10*3/uL — ABNORMAL HIGH (ref 4.0–10.5)
nRBC: 0 % (ref 0.0–0.2)

## 2022-12-27 LAB — BASIC METABOLIC PANEL
Anion gap: 7 (ref 5–15)
BUN: 54 mg/dL — ABNORMAL HIGH (ref 8–23)
CO2: 22 mmol/L (ref 22–32)
Calcium: 8 mg/dL — ABNORMAL LOW (ref 8.9–10.3)
Chloride: 108 mmol/L (ref 98–111)
Creatinine, Ser: 2.92 mg/dL — ABNORMAL HIGH (ref 0.61–1.24)
GFR, Estimated: 19 mL/min — ABNORMAL LOW (ref >60)
Glucose, Bld: 98 mg/dL (ref 70–99)
Potassium: 3.7 mmol/L (ref 3.5–5.1)
Sodium: 137 mmol/L (ref 135–145)

## 2022-12-27 MED ORDER — PANTOPRAZOLE SODIUM 40 MG PO TBEC
40.0000 mg | DELAYED_RELEASE_TABLET | Freq: Two times a day (BID) | ORAL | Status: DC
  Administered 2022-12-28 – 2023-01-03 (×13): 40 mg via ORAL
  Filled 2022-12-27 (×13): qty 1

## 2022-12-27 MED ORDER — ORAL CARE MOUTH RINSE: 15.0000 mL | OROMUCOSAL | Status: DC | PRN

## 2022-12-27 NOTE — Progress Notes (Signed)
[facility]    SUBJECTIVE: Patient sleeping comfortably in chair denies any symptoms denies any shortness of breath chest pain leg swelling or fever   Vitals:   12/26/22 2331 12/27/22 0400 12/27/22 0748 12/27/22 1144  BP: (!) 105/50 (!) 108/44 (!) 126/53 (!) 116/48  Pulse:   91 66  Resp: 20 20 17    Temp: 98.9 F (37.2 C) 98.5 F (36.9 C)    TempSrc: Oral Oral    SpO2: 100% 100% 100% 100%  Weight:      Height:         Intake/Output Summary (Last 24 hours) at 12/27/2022 1444 Last data filed at 12/26/2022 1507 Gross per 24 hour  Intake --  Output 350 ml  Net -350 ml      PHYSICAL EXAM  General: Well developed, well nourished, in no acute distress HEENT:  Normocephalic and atramatic Neck:  No JVD.  Lungs: Clear bilaterally to auscultation and percussion. Heart: HRRR . Normal S1 and S2 without gallops or murmurs.  Abdomen: Bowel sounds are positive, abdomen soft and non-tender  Msk:  Back normal, normal gait. Normal strength and tone for age. Extremities: No clubbing, cyanosis or edema.   Neuro: Alert and oriented X 3. Psych:  Good affect, responds appropriately   LABS: Basic Metabolic Panel: Recent Labs    12/25/22 0500 12/26/22 0639 12/27/22 0436  NA 139 137 137  K 3.5 3.6 3.7  CL 111 109 108  CO2 17* 21* 22  GLUCOSE 92 120* 98  BUN 43* 46* 54*  CREATININE 2.52* 2.67* 2.92*  CALCIUM 7.8* 7.9* 8.0*  MG 1.9 2.0  --   PHOS 2.8 2.4*  --    Liver Function Tests: No results for input(s): "AST", "ALT", "ALKPHOS", "BILITOT", "PROT", "ALBUMIN" in the last 72 hours. No results for input(s): "LIPASE", "AMYLASE" in the last 72 hours. CBC: Recent Labs    12/26/22 0639 12/27/22 0436  WBC 14.0* 15.4*  HGB 8.9* 8.8*  HCT 27.4* 27.2*  MCV 89.5 89.8  PLT 162 176   Cardiac Enzymes: No results for input(s): "CKTOTAL", "CKMB", "CKMBINDEX", "TROPONINI" in the last 72 hours. BNP: Invalid input(s): "POCBNP" D-Dimer: No results for input(s): "DDIMER" in the last  72 hours. Hemoglobin A1C: No results for input(s): "HGBA1C" in the last 72 hours. Fasting Lipid Panel: No results for input(s): "CHOL", "HDL", "LDLCALC", "TRIG", "CHOLHDL", "LDLDIRECT" in the last 72 hours. Thyroid Function Tests: No results for input(s): "TSH", "T4TOTAL", "T3FREE", "THYROIDAB" in the last 72 hours.  Invalid input(s): "FREET3" Anemia Panel: No results for input(s): "VITAMINB12", "FOLATE", "FERRITIN", "TIBC", "IRON", "RETICCTPCT" in the last 72 hours.  DG Chest Port 1 View  Result Date: 12/26/2022 CLINICAL DATA:  Shortness of breath, sepsis and hypoxia. EXAM: PORTABLE CHEST 1 VIEW COMPARISON:  12/21/2022 FINDINGS: Stable cardiac enlargement and appearance of pacemaker. Stable chronic lung disease and bibasilar scarring/atelectasis. No overt pulmonary consolidation, edema or pleural fluid identified. No pneumothorax. IMPRESSION: Stable cardiac enlargement and chronic lung disease. No acute findings. Electronically Signed   By: Aletta Edouard M.D.   On: 12/26/2022 10:34     Echo mild reduced left ventricular function EF of around 40 to 45% patient with severe aortic stenosis valve area 0.7 cm coronary  TELEMETRY: Paced rhythm at 66:  ASSESSMENT AND PLAN:  Principal Problem:   Severe sepsis with acute organ dysfunction (HCC) Active Problems:   Acute on chronic diastolic (congestive) heart failure (HCC)   Presence of cardiac pacemaker   Sick sinus syndrome ([place])  Chronic kidney disease, stage 3b (HCC)   Paroxysmal atrial fibrillation (HCC)   Iron deficiency anemia, unspecified   Hyperlipidemia    Plan Sick sinus syndrome permanent pacemaker in place appears to be functioning adequately continue current management Mild cardiomyopathy EF around 40-45 continue current medical therapy Severe aortic stenosis valve area 0.7 will recommend medical therapy poor candidate for TAVR or SAVR Chronic systolic congestive heart failure reasonably compensated at this point  continue current management Atrial fibrillation paroxysmal pacemaker in place poor candidate for anticoagulation rate adequately controlled Hyperlipidemia maintain statin therapy for lipid management GERD patient doing reasonably well on Protonix therapy for reflux symptoms Sepsis initially treated with broad-spectrum antibiotic therapy management as per hospitalist DVT prophylaxis with subcu heparin Will maintain a conservative cardiology input at this stage    Yolonda Kida, MD, 12/27/2022 2:44 PM

## 2022-12-27 NOTE — Progress Notes (Signed)
Progress Note   Patient: Devin Washington HQI:[id number] DOB: 05/25/1923 DOA: 12/21/2022     6 DOS: the patient was seen and examined on 12/27/2022   Brief hospital course:  Devin Washington is a 89 y.o. male with medical history significant of atrial fibrillation, essential hypertension, chronic kidney disease stage IIIb, iron deficiency anemia, sick sinus syndrome status post pacemaker placement, hyperlipidemia, essential hypertension, chronic diastolic heart failure who was initially brought into the ER as a Devin Washington.  Patient was obtained there was significant hypoxic respiratory failure.  He is currently unable to give any history and is on BiPAP.  Patient arrived with oxygen sat of 62% on room air.  He was in respiratory distress on CPAP from EMS.  Workup so far showed severe sepsis with lactic acid of more than 9 and pH of 7.18.  Vitals are however stable.  Patient has chest x-ray that is negative.  Acute viral screen also negative.  No obvious source of sepsis.  At this point he is being admitted with acute hypoxic respiratory failure as well as severe sepsis of unknown source.  Workup is ongoing to determine the source.      Assessment and Plan:   # Severe sepsis with endorgan damage: Patient severely hypoxic although no evidence of pneumonia on chest x-ray.   CT C/a/p: Peripheral reticulations with bibasilar subpleural cystic changes suggestive of interstitial lung disease/pulmonary fibrosis. Urinalysis was not very impressive, urine culture multiple species, contamination Continue cefepime  (d/c'd Vanc & Flagyl) S/p vancomycin DC'd on 1/9, MRSA PCR negative, blood culture negative, WBC improving.  Procalcitonin 22 elevated 1/12 CXR: Stable cardiac enlargement and chronic lung disease. No acute findings. 1/12 discontinued Flagyl, started empiric azithromycin 500 mg p.o. daily for 5 days along wit cefepime Trend WBC count - going up today   # Right lower extremity DVT Duplex shows DVT in  the posterior tibial vein Vascular surgery consulted, recommended Eliquis 2.5 milligram p.o. twice daily Chronic thrombosis left lower extremity aneurysm, surgery recommended no intervention, got opinion via secure chat, no official consult done   # Acute hypoxic respiratory failure with COPD exacerbation S/p BiPAP.  Continue antibiotics treatment and titrate of BiPAP S/p IV Solu-Medrol, discontinued steroids on 1/9 due to low hemoglobin, possible GI bleed.  Patient's respiratory failure resolved, currently saturating well on room air. Started Breo Ellipta inhaler, DuoNeb every 6 hourly scheduled followed by as needed Started Mucinex extremity gram p.o. twice daily and Tussionex for cough as needed   # Elevated troponin, type II MI most likely due to demand ischemia Severe Aortic Stenosis  Troponin 189----476  continue to trend Continue to monitor on telemetry TTE shows LVEF 40 to 45%, grade 1 diastolic dysfunction, LV demonstrate regional wall motion abnormality EKG shows paced rhythm Patient remained chest pain-free Cardiology consulted, recommended no invasive cardiac procedure at this stage continue aspirin   # Anemia due to iron deficiency, monitor H&H Iron level 19, transferrin saturation 12% Folic acid 9.5 WNL Start oral iron supplement when stable, avoid IV iron due to active infection 1/9, Hb 6.4, transfuse 1 unit of PRBC 1/10 Hb 8.9 stable Pantoprazole 40 mg IV twice daily -> switch to PO  # Metabolic acidosis CO2 81--15--21--17  S/p Sodium bicarbonate IV push given,  and s/p sodium bicarbonate IV infusion till 1/12, transition to oral bicarbonate Monitor BMP daily     # Chronic kidney disease stage IIIa: Hydrate.  Monitor renal function Creatinine 3.75--3.2--2.6--2.5 Monitor renal foods daily, monitor urine  output Avoid nephrotoxic medications, use renal dose medications     # Hypomagnesemia, repleted and resolved     # severe lactic acidosis: Aggressively  hydrate.  Continue antibiotics Lactic acidosis resolved, lactic acid 1.6 within normal range now. # sick sinus syndrome: Status post pacemaker placement.  Rate is now controlled.   # paroxysmal atrial fibrillation: Rate is controlled.  Home regimen.   # acute metabolic encephalopathy: Secondary to severe sepsis.  Resolved, patient is AOx3   # Pulmonary nodule, incidental finding on CT scan CT chest: Lingular subpleural 1.3 x 1 cm nodule. Consider one of the following in 3 months for both low-risk and high-risk individuals: (a) repeat chest CT, (b) follow-up PET-CT, or (c) tissue sampling. Patient was recommended to follow with PCP and pulmonary as an outpatient   PAD, abdominal aortic aneurysm CT A/P: Stable aneurysmal bilateral common iliac arteries. 6. Stable aneurysmal infrarenal abdominal aorta (3.4 cm). Recommend annual imaging followup by CTA or MRA.  Patient was recommended to follow with PCP for incidental findings as above   Vitamin D insufficiency: started vitamin D 50,000 units p.o. weekly, follow with PCP to repeat vitamin D level after 3 to 6 months.   Body mass index is 21.81 kg/m.      Subjective: No new complaints, agreeable for SNF placement  Physical Exam: Vitals:   12/27/22 0400 12/27/22 0748 12/27/22 1144 12/27/22 1651  BP: (!) 108/44 (!) 126/53 (!) 116/48 118/71  Pulse:  91 66 87  Resp: 20 17  18   Temp: 98.5 F (36.9 C)     TempSrc: Oral     SpO2: 100% 100% 100% 100%  Weight:      Height:       Physical Exam: General: lying comfortably, Mild distress and SOB Appear in no distress, affect appropriate Eyes: PERRLA ENT: Oral Mucosa Clear, moist  Neck: no JVD,  Cardiovascular: S1 and S2 Present, no Murmur,  Respiratory: good respiratory effort, Bilateral Air entry equal and Decreased, mild Crackles and mild wheezing bilaterally Abdomen: Bowel Sound present, Soft and no tenderness,  Skin: no rashes Extremities: RLE no Pedal edema, no calf tenderness,  left calf thrombosed chronic aneurysm Neurologic: Alert, Awake, non-focal  Data Reviewed:  Creat 2.92  PT and OT eval done, recommend SNF placement. TOC aware and working on placement      Diet: Heart healthy diet DVT Prophylaxis: Subcutaneous Heparin     Advance goals of care discussion: DNR   Family Communication: None at bedside   Disposition: Status is: Inpatient Remains inpatient appropriate because: Pt is from Home, admitted with sepsis, still has sepsis, which precludes a safe discharge. Discharge to SNF, when clinically stable, may need 1-2 more days to improve.  Planned Discharge Destination: Skilled nursing facility    Time spent: 35 minutes  Author: Delfino Lovett, MD 12/27/2022 5:07 PM  For on call review [email or web address].

## 2022-12-28 ENCOUNTER — Inpatient Hospital Stay: Payer: Medicare HMO

## 2022-12-28 DIAGNOSIS — R6520 Severe sepsis without septic shock: Secondary | ICD-10-CM | POA: Diagnosis not present

## 2022-12-28 DIAGNOSIS — A419 Sepsis, unspecified organism: Secondary | ICD-10-CM | POA: Diagnosis not present

## 2022-12-28 LAB — BASIC METABOLIC PANEL
Anion gap: 9 (ref 5–15)
BUN: 60 mg/dL — ABNORMAL HIGH (ref 8–23)
CO2: 17 mmol/L — ABNORMAL LOW (ref 22–32)
Calcium: 7.7 mg/dL — ABNORMAL LOW (ref 8.9–10.3)
Chloride: 108 mmol/L (ref 98–111)
Creatinine, Ser: 3.22 mg/dL — ABNORMAL HIGH (ref 0.61–1.24)
GFR, Estimated: 17 mL/min — ABNORMAL LOW (ref >60)
Glucose, Bld: 106 mg/dL — ABNORMAL HIGH (ref 70–99)
Potassium: 4 mmol/L (ref 3.5–5.1)
Sodium: 134 mmol/L — ABNORMAL LOW (ref 135–145)

## 2022-12-28 LAB — GLUCOSE, CAPILLARY
Glucose-Capillary: 115 mg/dL — ABNORMAL HIGH (ref 70–99)
Glucose-Capillary: 115 mg/dL — ABNORMAL HIGH (ref 70–99)
Glucose-Capillary: 130 mg/dL — ABNORMAL HIGH (ref 70–99)
Glucose-Capillary: 131 mg/dL — ABNORMAL HIGH (ref 70–99)
Glucose-Capillary: 99 mg/dL (ref 70–99)
Glucose-Capillary: 99 mg/dL (ref 70–99)

## 2022-12-28 LAB — CBC
HCT: 27.1 % — ABNORMAL LOW (ref 39.0–52.0)
Hemoglobin: 8.8 g/dL — ABNORMAL LOW (ref 13.0–17.0)
MCH: 29.2 pg (ref 26.0–34.0)
MCHC: 32.5 g/dL (ref 30.0–36.0)
MCV: 90 fL (ref 80.0–100.0)
Platelets: 202 10*3/uL (ref 150–400)
RBC: 3.01 MIL/uL — ABNORMAL LOW (ref 4.22–5.81)
RDW: 15.6 % — ABNORMAL HIGH (ref 11.5–15.5)
WBC: 17.6 10*3/uL — ABNORMAL HIGH (ref 4.0–10.5)
nRBC: 0 % (ref 0.0–0.2)

## 2022-12-28 LAB — PROCALCITONIN: Procalcitonin: 14.37 ng/mL

## 2022-12-28 MED ORDER — SODIUM CHLORIDE 0.9 % IV SOLN: INTRAVENOUS | Status: DC

## 2022-12-28 NOTE — Progress Notes (Signed)
PROGRESS NOTE    Devin Washington  BJY:[id number] DOB: 1923-07-09 DOA: 12/21/2022 PCP: Oneita Hurt, No   Brief Narrative:  Devin Washington is a 89 y.o. male with medical history significant of atrial fibrillation, essential hypertension, chronic kidney disease stage IIIb, iron deficiency anemia, sick sinus syndrome status post pacemaker placement, hyperlipidemia, essential hypertension, chronic diastolic heart failure who was initially brought into the ER as a [name].  Patient was obtained there was significant hypoxic respiratory failure.  He is currently unable to give any history and is on BiPAP.  Patient arrived with oxygen sat of 62% on room air.  He was in respiratory distress on CPAP from EMS.  Workup so far showed severe sepsis with lactic acid of more than 9 and pH of 7.18.  Vitals are however stable.  Patient has chest x-ray that is negative.  Acute viral screen also negative.  No obvious source of sepsis.  At this point he is being admitted with acute hypoxic respiratory failure as well as severe sepsis of unknown source.  Workup is ongoing to determine the source.     Assessment & Plan:   Severe sepsis with endorgan damage:  -CT C/a/p: Peripheral reticulations with bibasilar subpleural cystic changes suggestive of interstitial lung disease/pulmonary fibrosis. -Urinalysis was not very impressive, urine culture multiple species, contamination S/p vancomycin DC'd on 1/9, MRSA PCR negative, blood culture negative, WBC improving.  -Procalcitonin 22 upon admission 1/12 CXR: Stable cardiac enlargement and chronic lung disease. No acute findings. 1/12 discontinued Flagyl, started empiric azithromycin 500 mg p.o. daily for 5 days along with cefepime -Trend WBC count - going up today.  Remained afebrile   Right lower extremity DVT -Duplex shows DVT in the posterior tibial vein Vascular surgery consulted, recommended Eliquis 2.5 milligram p.o. twice daily -Chronic thrombosis left lower extremity  popliteal artery aneurysm, vascular surgery recommended no intervention.  Recommend Eliquis 2.5 mg twice daily for 3 months.  Follow-up outpatient   Acute hypoxic respiratory failure with COPD exacerbation -S/p BiPAP.  Continue antibiotics treatment, now on room air -S/p IV Solu-Medrol, discontinued steroids on 1/9 due to low hemoglobin, possible GI bleed.  Patient's respiratory failure resolved, currently saturating well on room air. -Started Breo Ellipta inhaler, DuoNeb every 6 hourly scheduled followed by as needed   Elevated troponin, type II MI most likely due to demand ischemia Severe Aortic Stenosis  -Troponin 189----476   -TTE shows LVEF 40 to 45%, grade 1 diastolic dysfunction, LV demonstrate regional wall motion abnormality -Cardiology consulted, recommended no invasive cardiac procedure at this stage -continue aspirin    Iron deficiency anemia: -Iron level 19, transferrin saturation 12%, Folic acid 9.5 WNL -Start oral iron supplement when stable, avoid IV iron due to active infection -1/9, Hb 6.4, transfuse 1 unit of PRBC -Pantoprazole 40 mg IV twice daily -> switch to PO -H&H remained stable   Metabolic acidosis -CO2 81--15--21--17  -S/p Sodium bicarbonate IV push given,  and s/p sodium bicarbonate IV infusion till 1/12, transition to oral bicarbonate -Monitor BMP daily   Chronic kidney disease stage IIIb-IV:  -Creatinine trended up.  Start gentle hydration.  Will get renal ultrasound -Monitor renal foods daily, monitor urine output -Avoid nephrotoxic medications, use renal dose medications   Hypomagnesemia, repleted and resolved   sick sinus syndrome: Status post pacemaker placement.  Rate is now controlled.   paroxysmal atrial fibrillation: Rate is controlled.  Home regimen.   acute metabolic encephalopathy: Secondary to severe sepsis.  Resolved, patient is AOx3  Pulmonary nodule, incidental finding on CT scan -CT chest: Lingular subpleural 1.3 x 1 cm nodule.  Consider one of the following in 3 months for both low-risk and high-risk individuals: (a) repeat chest CT, (b) follow-up PET-CT, or (c) tissue sampling. -Patient was recommended to follow with PCP and pulmonary as an outpatient   PAD, abdominal aortic aneurysm -CT A/P: Stable aneurysmal bilateral common iliac arteries. 6. Stable aneurysmal infrarenal abdominal aorta (3.4 cm). Recommend annual imaging followup by CTA or MRA.  -Patient was recommended to follow with PCP for incidental findings as above   Vitamin D insufficiency: started vitamin D 50,000 units p.o. weekly, follow with PCP to repeat vitamin D level after 3 to 6 months.   DVT prophylaxis: Eliquis Code Status: DNR Family Communication:  None present at bedside.  Plan of care discussed with patient in length and he verbalized understanding and agreed with it. Disposition Plan: SNF  Consultants:  Cardiology Vascular  Procedures:  None   Status is: Inpatient   Subjective: Patient seen and examined.  Sleepy but arousable.  Denies any new complaints.  Remained afebrile.  No acute events overnight Objective: Vitals:   12/28/22 0000 12/28/22 0424 12/28/22 0757 12/28/22 1225  BP: 114/68 110/72 (!) 111/46 114/69  Pulse: 84 65 62 64  Resp: 16 17 17 17   Temp: 98.4 F (36.9 C) 98.3 F (36.8 C) 98 F (36.7 C) 98.2 F (36.8 C)  TempSrc: Oral Oral  Oral  SpO2: 100% 100% 100% 100%  Weight:      Height:        Intake/Output Summary (Last 24 hours) at 12/28/2022 1411 Last data filed at 12/28/2022 0026 Gross per 24 hour  Intake 440.06 ml  Output 600 ml  Net -159.94 ml   Filed Weights   12/22/22 0019  Weight: 68.9 kg    Examination:  General exam: Appears calm and comfortable, on room air, sleepy but arousable Respiratory system: Clear to auscultation. Respiratory effort normal. Cardiovascular system: S1 & S2 heard, RRR. No JVD, murmurs, rubs, gallops or clicks. Gastrointestinal system: Abdomen is nondistended,  soft and nontender. No organomegaly or masses felt. Normal bowel sounds heard. Central nervous system: Sleepy but arousable and answers appropriately  extremities: Left lower extremity: Swollen as compared to right. no redness or tenderness noted. Skin: No rashes, lesions or ulcers   Data Reviewed: I have personally reviewed following labs and imaging studies  CBC: Recent Labs  Lab 12/21/22 1854 12/22/22 0350 12/24/22 0420 12/25/22 0500 12/26/22 0639 12/27/22 0436 12/28/22 0526  WBC 16.8*   < > 16.8* 12.9* 14.0* 15.4* 17.6*  NEUTROABS 14.8*  --   --   --   --   --   --   HGB 8.9*   < > 8.9* 8.6* 8.9* 8.8* 8.8*  HCT 29.0*   < > 27.5* 26.5* 27.4* 27.2* 27.1*  MCV 95.1   < > 91.1 90.8 89.5 89.8 90.0  PLT 279   < > 178 159 162 176 202   < > = values in this interval not displayed.   Basic Metabolic Panel: Recent Labs  Lab 12/22/22 0350 12/23/22 0442 12/24/22 0420 12/25/22 0500 12/26/22 0639 12/27/22 0436 12/28/22 0526  NA 138 135 138 139 137 137 134*  K 3.8 4.0 3.7 3.5 3.6 3.7 4.0  CL 111 110 109 111 109 108 108  CO2 18* 15* 21* 17* 21* 22 17*  GLUCOSE 147* 109* 157* 92 120* 98 106*  BUN 61* 44* 48* 43* 46*  54* 60*  CREATININE 3.20* 2.36* 2.60* 2.52* 2.67* 2.92* 3.22*  CALCIUM 8.0* 8.2* 8.3* 7.8* 7.9* 8.0* 7.7*  MG 1.8 1.5* 2.1 1.9 2.0  --   --   PHOS 4.9* 3.9 3.3 2.8 2.4*  --   --    GFR: Estimated Creatinine Clearance: 12.8 mL/min (A) (by C-G formula based on SCr of 3.22 mg/dL (H)). Liver Function Tests: Recent Labs  Lab 12/21/22 1854 12/22/22 0350  AST 38 19  ALT 17 18  ALKPHOS 68 60  BILITOT 1.2 0.5  PROT 6.3* 5.5*  ALBUMIN 2.8* 2.5*   No results for input(s): "LIPASE", "AMYLASE" in the last 168 hours. No results for input(s): "AMMONIA" in the last 168 hours. Coagulation Profile: Recent Labs  Lab 12/21/22 1854 12/22/22 0350  INR 1.3* 1.3*   Cardiac Enzymes: No results for input(s): "CKTOTAL", "CKMB", "CKMBINDEX", "TROPONINI" in the last 168  hours. BNP (last 3 results) No results for input(s): "PROBNP" in the last 8760 hours. HbA1C: No results for input(s): "HGBA1C" in the last 72 hours. CBG: Recent Labs  Lab 12/27/22 1955 12/27/22 2359 12/28/22 0441 12/28/22 0757 12/28/22 1209  GLUCAP 156* 130* 99 115* 99   Lipid Profile: No results for input(s): "CHOL", "HDL", "LDLCALC", "TRIG", "CHOLHDL", "LDLDIRECT" in the last 72 hours. Thyroid Function Tests: No results for input(s): "TSH", "T4TOTAL", "FREET4", "T3FREE", "THYROIDAB" in the last 72 hours. Anemia Panel: No results for input(s): "VITAMINB12", "FOLATE", "FERRITIN", "TIBC", "IRON", "RETICCTPCT" in the last 72 hours. Sepsis Labs: Recent Labs  Lab 12/21/22 1843 12/21/22 1854 12/22/22 0123 12/22/22 0350  PROCALCITON  --  15.94  --  22.15  LATICACIDVEN >9.0*  --  2.0* 1.6    Recent Results (from the past 240 hour(s))  Resp panel by RT-PCR (RSV, Flu A&B, Covid) Anterior Nasal Swab     Status: None   Collection Time: 12/21/22  6:54 PM   Specimen: Anterior Nasal Swab  Result Value Ref Range Status   SARS Coronavirus 2 by RT PCR NEGATIVE NEGATIVE Final    Comment: (NOTE) SARS-CoV-2 target nucleic acids are NOT DETECTED.  The SARS-CoV-2 RNA is generally detectable in upper respiratory specimens during the acute phase of infection. The lowest concentration of SARS-CoV-2 viral copies this assay can detect is 138 copies/mL. A negative result does not preclude SARS-Cov-2 infection and should not be used as the sole basis for treatment or other patient management decisions. A negative result may occur with  improper specimen collection/handling, submission of specimen other than nasopharyngeal swab, presence of viral mutation(s) within the areas targeted by this assay, and inadequate number of viral copies(<138 copies/mL). A negative result must be combined with clinical observations, patient history, and epidemiological information. The expected result is  Negative.  Fact Sheet for Patients:  BloggerCourse.com  Fact Sheet for Healthcare Providers:  SeriousBroker.it  This test is no t yet approved or cleared by the [place] FDA and  has been authorized for detection and/or diagnosis of SARS-CoV-2 by FDA under an Emergency Use Authorization (EUA). This EUA will remain  in effect (meaning this test can be used) for the duration of the COVID-19 declaration under Section 564(b)(1) of the Act, 21 U.S.C.section 360bbb-3(b)(1), unless the authorization is terminated  or revoked sooner.       Influenza A by PCR NEGATIVE NEGATIVE Final   Influenza B by PCR NEGATIVE NEGATIVE Final    Comment: (NOTE) The Xpert Xpress SARS-CoV-2/FLU/RSV plus assay is intended as an aid in the diagnosis of influenza from Nasopharyngeal  swab specimens and should not be used as a sole basis for treatment. Nasal washings and aspirates are unacceptable for Xpert Xpress SARS-CoV-2/FLU/RSV testing.  Fact Sheet for Patients: BloggerCourse.com  Fact Sheet for Healthcare Providers: SeriousBroker.it  This test is not yet approved or cleared by the [place] FDA and has been authorized for detection and/or diagnosis of SARS-CoV-2 by FDA under an Emergency Use Authorization (EUA). This EUA will remain in effect (meaning this test can be used) for the duration of the COVID-19 declaration under Section 564(b)(1) of the Act, 21 U.S.C. section 360bbb-3(b)(1), unless the authorization is terminated or revoked.     Resp Syncytial Virus by PCR NEGATIVE NEGATIVE Final    Comment: (NOTE) Fact Sheet for Patients: BloggerCourse.com  Fact Sheet for Healthcare Providers: SeriousBroker.it  This test is not yet approved or cleared by the [place] FDA and has been authorized for detection and/or diagnosis of  SARS-CoV-2 by FDA under an Emergency Use Authorization (EUA). This EUA will remain in effect (meaning this test can be used) for the duration of the COVID-19 declaration under Section 564(b)(1) of the Act, 21 U.S.C. section 360bbb-3(b)(1), unless the authorization is terminated or revoked.  Performed at [facility], [address] [place], [place] [address]   Blood Culture (routine x 2)     Status: None   Collection Time: 12/21/22  6:54 PM   Specimen: BLOOD  Result Value Ref Range Status   Specimen Description BLOOD BLOOD RIGHT FOREARM  Final   Special Requests   Final    BOTTLES DRAWN AEROBIC AND ANAEROBIC Blood Culture adequate volume   Culture   Final    NO GROWTH 5 DAYS Performed at [facility], [address], [place], [place] [address]    Report Status 12/26/2022 FINAL  Final  Blood Culture (routine x 2)     Status: None   Collection Time: 12/21/22  6:59 PM   Specimen: BLOOD  Result Value Ref Range Status   Specimen Description BLOOD BLOOD LEFT FOREARM  Final   Special Requests   Final    BOTTLES DRAWN AEROBIC AND ANAEROBIC Blood Culture adequate volume   Culture   Final    NO GROWTH 5 DAYS Performed at [facility], [address] [place], [place] [address]    Report Status 12/26/2022 FINAL  Final  Urine Culture     Status: Abnormal   Collection Time: 12/22/22 12:12 AM   Specimen: In/Out Cath Urine  Result Value Ref Range Status   Specimen Description   Final    IN/OUT CATH URINE Performed at [facility], [address] [place], [place] [address]    Special Requests   Final    NONE Performed at [facility], [address] [place], [place] [address]    Culture MULTIPLE SPECIES PRESENT, SUGGEST RECOLLECTION (A)  Final   Report Status 12/23/2022 FINAL  Final  MRSA Next Gen by PCR, Nasal     Status: None   Collection Time: 12/22/22 10:30 PM   Specimen: Nasal Mucosa; Nasal Swab  Result Value Ref Range  Status   MRSA by PCR Next Gen NOT DETECTED NOT DETECTED Final    Comment: (NOTE) The GeneXpert MRSA Assay (FDA approved for NASAL specimens only), is one component of a comprehensive MRSA colonization surveillance program. It is not intended to diagnose MRSA infection nor to guide or monitor treatment for MRSA infections. Test performance is not FDA approved in patients less than 2 years  old. Performed at [facility], [address], [place], [place] [address]       Radiology Studies: No results found.  Scheduled Meds:  sodium chloride   Intravenous Once   amLODipine  10 mg Oral Daily   apixaban  2.5 mg Oral BID   vitamin C  500 mg Oral Daily   aspirin EC  81 mg Oral Daily   atorvastatin  20 mg Oral Daily   azithromycin  500 mg Oral Daily   bisacodyl  10 mg Oral QHS   fluticasone furoate-vilanterol  1 puff Inhalation Daily   guaiFENesin  600 mg Oral BID   insulin aspart  0-9 Units Subcutaneous Q4H   iron polysaccharides  150 mg Oral Daily   metoprolol tartrate  25 mg Oral BID   pantoprazole  40 mg Oral BID AC   sodium bicarbonate  650 mg Oral TID   traZODone  50 mg Oral QHS   Vitamin D (Ergocalciferol)  50,000 Units Oral Q7 days   Continuous Infusions:  ceFEPime (MAXIPIME) IV Stopped (12/28/22 0025)     LOS: 7 days   Time spent: 35 minutes   Easter Schinke Estill Cotta, MD [facility]  If 7PM-7AM, please contact night-coverage [email or web address] 12/28/2022, 2:11 PM

## 2022-12-28 NOTE — Progress Notes (Signed)
[facility] Cardiology    SUBJECTIVE: Pt states to be doing okay resting comfortable.  Sleeping well   Vitals:   12/27/22 1957 12/28/22 0000 12/28/22 0424 12/28/22 0757  BP: 107/70 114/68 110/72 (!) 111/46  Pulse: 93 84 65 62  Resp: 18 16 17 17   Temp: 98.2 F (36.8 C) 98.4 F (36.9 C) 98.3 F (36.8 C) 98 F (36.7 C)  TempSrc: Oral Oral Oral   SpO2: 100% 100% 100% 100%  Weight:      Height:         Intake/Output Summary (Last 24 hours) at 12/28/2022 2951 Last data filed at 12/28/2022 0026 Gross per 24 hour  Intake 440.06 ml  Output 600 ml  Net -159.94 ml      PHYSICAL EXAM  General: Well developed, well nourished, in no acute distress HEENT:  Normocephalic and atramatic Neck:  No JVD.  Lungs: Clear bilaterally to auscultation and percussion. Heart: HRRR . Normal S1 and S2 without gallops or murmurs.  Abdomen: Bowel sounds are positive, abdomen soft and non-tender  Msk:  Back normal, normal gait. Normal strength and tone for age. Extremities: No clubbing, cyanosis or edema.   Neuro: Alert and oriented X 3. Psych:  Good affect, responds appropriately   LABS: Basic Metabolic Panel: Recent Labs    12/26/22 0639 12/27/22 0436 12/28/22 0526  NA 137 137 134*  K 3.6 3.7 4.0  CL 109 108 108  CO2 21* 22 17*  GLUCOSE 120* 98 106*  BUN 46* 54* 60*  CREATININE 2.67* 2.92* 3.22*  CALCIUM 7.9* 8.0* 7.7*  MG 2.0  --   --   PHOS 2.4*  --   --    Liver Function Tests: No results for input(s): "AST", "ALT", "ALKPHOS", "BILITOT", "PROT", "ALBUMIN" in the last 72 hours. No results for input(s): "LIPASE", "AMYLASE" in the last 72 hours. CBC: Recent Labs    12/27/22 0436 12/28/22 0526  WBC 15.4* 17.6*  HGB 8.8* 8.8*  HCT 27.2* 27.1*  MCV 89.8 90.0  PLT 176 202   Cardiac Enzymes: No results for input(s): "CKTOTAL", "CKMB", "CKMBINDEX", "TROPONINI" in the last 72 hours. BNP: Invalid input(s): "POCBNP" D-Dimer: No results for input(s): "DDIMER" in the last 72  hours. Hemoglobin A1C: No results for input(s): "HGBA1C" in the last 72 hours. Fasting Lipid Panel: No results for input(s): "CHOL", "HDL", "LDLCALC", "TRIG", "CHOLHDL", "LDLDIRECT" in the last 72 hours. Thyroid Function Tests: No results for input(s): "TSH", "T4TOTAL", "T3FREE", "THYROIDAB" in the last 72 hours.  Invalid input(s): "FREET3" Anemia Panel: No results for input(s): "VITAMINB12", "FOLATE", "FERRITIN", "TIBC", "IRON", "RETICCTPCT" in the last 72 hours.  DG Chest Port 1 View  Result Date: 12/26/2022 CLINICAL DATA:  Shortness of breath, sepsis and hypoxia. EXAM: PORTABLE CHEST 1 VIEW COMPARISON:  12/21/2022 FINDINGS: Stable cardiac enlargement and appearance of pacemaker. Stable chronic lung disease and bibasilar scarring/atelectasis. No overt pulmonary consolidation, edema or pleural fluid identified. No pneumothorax. IMPRESSION: Stable cardiac enlargement and chronic lung disease. No acute findings. Electronically Signed   By: Aletta Edouard M.D.   On: 12/26/2022 10:34     Echo pending  TELEMETRY: Paced rhythm 60:  ASSESSMENT AND PLAN:  Principal Problem:   Severe sepsis with acute organ dysfunction (HCC) Active Problems:   Acute on chronic diastolic (congestive) heart failure (HCC)   Presence of cardiac pacemaker   Sick sinus syndrome (HCC)   Chronic kidney disease, stage 3b (HCC)   Paroxysmal atrial fibrillation (HCC)   Iron deficiency anemia, unspecified  Hyperlipidemia    Plan Severe sepsis with endorgan dysfunction continue current therapy broad-spectrum antibiotic therapy Acute on chronic diastolic congestive heart failure reasonably compensated now denies shortness of breath or leg edema Sick sinus syndrome bradycardia permanent pacemaker in place appears to be functioning adequately Chronic renal sufficiency stage IIIb maintain adequate hydration follow-up with nephrology Paroxysmal atrial fibrillation stable continue Eliquis 2.5 twice a day  patient rate  controlled pacemaker rate of [place], MD 12/28/2022 9:17 AM

## 2022-12-29 ENCOUNTER — Inpatient Hospital Stay: Payer: Medicare HMO

## 2022-12-29 DIAGNOSIS — A419 Sepsis, unspecified organism: Secondary | ICD-10-CM | POA: Diagnosis not present

## 2022-12-29 DIAGNOSIS — R6520 Severe sepsis without septic shock: Secondary | ICD-10-CM | POA: Diagnosis not present

## 2022-12-29 LAB — GLUCOSE, CAPILLARY
Glucose-Capillary: 120 mg/dL — ABNORMAL HIGH (ref 70–99)
Glucose-Capillary: 120 mg/dL — ABNORMAL HIGH (ref 70–99)
Glucose-Capillary: 122 mg/dL — ABNORMAL HIGH (ref 70–99)
Glucose-Capillary: 125 mg/dL — ABNORMAL HIGH (ref 70–99)
Glucose-Capillary: 137 mg/dL — ABNORMAL HIGH (ref 70–99)
Glucose-Capillary: 144 mg/dL — ABNORMAL HIGH (ref 70–99)

## 2022-12-29 LAB — CBC
HCT: 23.6 % — ABNORMAL LOW (ref 39.0–52.0)
Hemoglobin: 7.9 g/dL — ABNORMAL LOW (ref 13.0–17.0)
MCH: 29.6 pg (ref 26.0–34.0)
MCHC: 33.5 g/dL (ref 30.0–36.0)
MCV: 88.4 fL (ref 80.0–100.0)
Platelets: 220 10*3/uL (ref 150–400)
RBC: 2.67 MIL/uL — ABNORMAL LOW (ref 4.22–5.81)
RDW: 15.9 % — ABNORMAL HIGH (ref 11.5–15.5)
WBC: 15.3 10*3/uL — ABNORMAL HIGH (ref 4.0–10.5)
nRBC: 0 % (ref 0.0–0.2)

## 2022-12-29 LAB — BASIC METABOLIC PANEL
Anion gap: 6 (ref 5–15)
BUN: 70 mg/dL — ABNORMAL HIGH (ref 8–23)
CO2: 21 mmol/L — ABNORMAL LOW (ref 22–32)
Calcium: 8 mg/dL — ABNORMAL LOW (ref 8.9–10.3)
Chloride: 108 mmol/L (ref 98–111)
Creatinine, Ser: 3.69 mg/dL — ABNORMAL HIGH (ref 0.61–1.24)
GFR, Estimated: 14 mL/min — ABNORMAL LOW (ref >60)
Glucose, Bld: 120 mg/dL — ABNORMAL HIGH (ref 70–99)
Potassium: 3.8 mmol/L (ref 3.5–5.1)
Sodium: 135 mmol/L (ref 135–145)

## 2022-12-29 LAB — PROCALCITONIN: Procalcitonin: 10.94 ng/mL

## 2022-12-29 LAB — MAGNESIUM: Magnesium: 1.9 mg/dL (ref 1.7–2.4)

## 2022-12-29 MED ORDER — SODIUM CHLORIDE 0.9 % IV SOLN: INTRAVENOUS | Status: DC

## 2022-12-29 MED ORDER — SODIUM CHLORIDE 0.9 % IV BOLUS
500.0000 mL | Freq: Once | INTRAVENOUS | Status: AC
  Administered 2022-12-29: 500 mL via INTRAVENOUS

## 2022-12-29 MED ORDER — AMLODIPINE BESYLATE 10 MG PO TABS
10.0000 mg | ORAL_TABLET | Freq: Every day | ORAL | Status: DC
  Filled 2022-12-29: qty 1

## 2022-12-29 MED ORDER — SIMETHICONE 80 MG PO CHEW: 80.0000 mg | CHEWABLE_TABLET | Freq: Four times a day (QID) | ORAL | Status: DC | PRN

## 2022-12-29 MED ORDER — TRAZODONE HCL 50 MG PO TABS
25.0000 mg | ORAL_TABLET | Freq: Every day | ORAL | Status: DC
  Administered 2022-12-29 – 2023-01-02 (×5): 25 mg via ORAL
  Filled 2022-12-29 (×5): qty 1

## 2022-12-29 NOTE — Progress Notes (Signed)
Pt dry heaving and lethargic this morning. Too lethargic and nauseous to administer a.m. PO meds.

## 2022-12-29 NOTE — Progress Notes (Addendum)
BP is  95/44 and the MAP is 60 with  multiple checks. Notified Dr. Sidney Ace

## 2022-12-29 NOTE — Progress Notes (Signed)
Occupational Therapy Treatment Patient Details Name: Devin Washington MRN: [id number] DOB: 29-May-1923 Today's Date: 12/29/2022   History of present illness Pt is a 89 y/o M admitted on 12/21/22 after presenting with acute hypoxic repsiratory failure & placed on BiPAP. Work up revealed sepsis of unknown source. Pt also found to have DVT. PMH: a-fib, HTN, chronic renal sufficiency, anemia, SSS, permanent pacemaker, HLD, DM, aortic stenosis   OT comments  Mr Mannor was seen for OT treatment on this date. Upon arrival to room pt sidelying in bed, agreeable to tx. Pt requires MOD A bed mobility and seated feeding tasks, assist for sitting balance 2/2 L lateral lean. Attempted to eat grapes/drink water however noted drooling/leakage from L side of mouth. New L sided weakness noted - MD/RN notified. Will continue to follow POC. Discharge recommendation remains appropriate.     Recommendations for follow up therapy are one component of a multi-disciplinary discharge planning process, led by the attending physician.  Recommendations may be updated based on patient status, additional functional criteria and insurance authorization.    Follow Up Recommendations  Skilled nursing-short term rehab (<3 hours/day)     Assistance Recommended at Discharge Intermittent Supervision/Assistance  Patient can return home with the following  A lot of help with walking and/or transfers;A lot of help with bathing/dressing/bathroom   Equipment Recommendations  Other (comment)    Recommendations for Other Services      Precautions / Restrictions Precautions Precautions: Fall Restrictions Weight Bearing Restrictions: No       Mobility Bed Mobility Overal bed mobility: Needs Assistance Bed Mobility: Supine to Sit, Sit to Supine     Supine to sit: HOB elevated, Mod assist Sit to supine: Mod assist        Transfers                   General transfer comment: attempted lateral scoot, unable to  tolerate with total A     Balance Overall balance assessment: Needs assistance Sitting-balance support: Single extremity supported, Feet supported Sitting balance-Leahy Scale: Poor   Postural control: Left lateral lean                                 ADL either performed or assessed with clinical judgement   ADL Overall ADL's : Needs assistance/impaired                                       General ADL Comments: MOD A seated feedign tasks, assist for sitting balance 2/2 L lateral lean. MAX A don B socks seated EOB      Cognition Arousal/Alertness: Lethargic Behavior During Therapy: WFL for tasks assessed/performed Overall Cognitive Status: Within Functional Limits for tasks assessed                                                     Pertinent Vitals/ Pain       Pain Assessment Pain Assessment: No/denies pain   Frequency  Min 2X/week        Progress Toward Goals  OT Goals(current goals can now be found in the care plan section)  Progress towards OT goals: Progressing toward goals  Acute  Rehab OT Goals Patient Stated Goal: go to rehab OT Goal Formulation: With patient Time For Goal Achievement: 01/09/23 Potential to Achieve Goals: Good ADL Goals Pt Will Perform Grooming: with modified independence;standing Pt Will Perform Lower Body Dressing: with modified independence;sit to/from stand Pt Will Transfer to Toilet: with modified independence;ambulating;regular height toilet  Plan Discharge plan remains appropriate;Frequency remains appropriate    Co-evaluation                 AM-PAC OT "6 Clicks" Daily Activity     Outcome Measure   Help from another person eating meals?: None Help from another person taking care of personal grooming?: A Lot Help from another person toileting, which includes using toliet, bedpan, or urinal?: A Lot Help from another person bathing (including washing, rinsing,  drying)?: A Lot Help from another person to put on and taking off regular upper body clothing?: A Little Help from another person to put on and taking off regular lower body clothing?: A Little 6 Click Score: 16    End of Session    OT Visit Diagnosis: Other abnormalities of gait and mobility (R26.89);Muscle weakness (generalized) (M62.81)   Activity Tolerance Patient tolerated treatment well   Patient Left in bed;with call bell/phone within reach;with bed alarm set   Nurse Communication Mobility status;Other (comment) (conccern for stroke)        Time: 1420-1430 OT Time Calculation (min): 10 min  Charges: OT General Charges $OT Visit: 1 Visit OT Treatments $Self Care/Home Management : 8-22 mins  Kathie Dike, M.S. OTR/L  12/29/22, 3:41 PM  ascom [phone]

## 2022-12-29 NOTE — Progress Notes (Addendum)
Mobility Specialist - Progress Note   12/29/22 1000  Mobility  Activity Turned to right side  $Mobility charge 1 Mobility     Pt sleeping in bed upon arrival, utilizing RA. Slouched down onto L side. Awakens to gentle touch and reports not feeling well this morning. Pt initially agreeable to light activity; however pt begin to vomit. Breakfast tray at bedside untouched, pt states he isn't hungry. Pt repositioned in bed and able to take a few sips of Ensure at pt request. Alarm set, needs in reach. RN aware.   Kathee Delton Mobility Specialist 12/29/22, 10:28 AM

## 2022-12-29 NOTE — Care Management Important Message (Signed)
Important Message  Patient Details  Name: ABDIAZIZ KLAHN MRN: [id number] Date of Birth: 04-24-1923   Medicare Important Message Given:  Yes     Dannette Barbara 12/29/2022, 12:10 PM

## 2022-12-29 NOTE — Progress Notes (Signed)
Pt slept almost all day. Arousable and at a few times pt was perky and alert but most of the day lethargic and sleepy.

## 2022-12-29 NOTE — NC FL2 (Signed)
MEDICAID FL2 LEVEL OF CARE FORM     IDENTIFICATION  Patient Name: Devin Washington Birthdate: 1923-01-20 Sex: male Admission Date (Current Location): 12/21/2022  [facility] [place] Number:  Chiropodist and Address:  [facility], [address] [place], [place] [address]      Provider Number: [id number]  Attending Physician Name and Address:  Gillis Santa, MD  Relative Name and Phone Number:  Archie Patten (granddaughter) [phone]    Current Level of Care: Hospital Recommended Level of Care: Skilled Nursing Facility Prior Approval Number:    Date Approved/Denied:   PASRR Number: [id number]  Discharge Plan: SNF    Current Diagnoses: Patient Active Problem List   Diagnosis Date Noted   Severe sepsis with acute organ dysfunction (HCC) 12/21/2022   Hyperlipidemia 12/21/2022   Acute on chronic diastolic (congestive) heart failure (HCC) 12/15/2020   Presence of cardiac pacemaker 12/15/2020   Sick sinus syndrome (HCC) 12/15/2020   Chronic kidney disease, stage 3b (HCC) 12/15/2020   Paroxysmal atrial fibrillation (HCC) 12/15/2020   Iron deficiency anemia, unspecified 12/15/2020    Orientation RESPIRATION BLADDER Height & Weight     Self, Situation, Time, Place  Normal Incontinent, External catheter Weight: 152 lb (68.9 kg) Height:  5\' 10"  (177.8 cm)  BEHAVIORAL SYMPTOMS/MOOD NEUROLOGICAL BOWEL NUTRITION STATUS      Incontinent Diet (see discharge summary)  AMBULATORY STATUS COMMUNICATION OF NEEDS Skin   Limited Assist Verbally Normal                       Personal Care Assistance Level of Assistance  Bathing, Feeding, Dressing, Total care Bathing Assistance: Limited assistance Feeding assistance: Independent Dressing Assistance: Limited assistance Total Care Assistance: Limited assistance   Functional Limitations Info  Sight, Hearing, Speech Sight Info: Adequate Hearing Info: Impaired Speech Info: Adequate     SPECIAL CARE FACTORS FREQUENCY  PT (By licensed PT), OT (By licensed OT)     PT Frequency: min 4x weekly OT Frequency: min 4x weekly            Contractures Contractures Info: Not present    Additional Factors Info  Code Status, Allergies Code Status Info: DNR Allergies Info: NKA           Current Medications (12/29/2022):  This is the current hospital active medication list Current Facility-Administered Medications  Medication Dose Route Frequency Provider Last Rate Last Admin   0.9 %  sodium chloride infusion (Manually program via Guardrails IV Fluids)   Intravenous Once Gillis Santa, MD       0.9 %  sodium chloride infusion   Intravenous Continuous Gillis Santa, MD 75 mL/hr at 12/29/22 0904 New Bag at 12/29/22 0904   acetaminophen (TYLENOL) tablet 650 mg  650 mg Oral Q6H PRN Rometta Emery, MD       Or   acetaminophen (TYLENOL) suppository 650 mg  650 mg Rectal Q6H PRN Rometta Emery, MD       albuterol (PROVENTIL) (2.5 MG/3ML) 0.083% nebulizer solution 2.5 mg  2.5 mg Nebulization Q6H PRN Gillis Santa, MD   2.5 mg at 12/27/22 0914   [START ON 12/30/2022] amLODipine (NORVASC) tablet 10 mg  10 mg Oral Daily Gillis Santa, MD       apixaban Everlene Balls) tablet 2.5 mg  2.5 mg Oral BID Gillis Santa, MD   2.5 mg at 12/28/22 2030   ascorbic acid (VITAMIN C) tablet 500 mg  500 mg Oral Daily Gillis Santa,  MD   500 mg at 12/28/22 0901   aspirin EC tablet 81 mg  81 mg Oral Daily Gillis Santa, MD   81 mg at 12/28/22 0901   atorvastatin (LIPITOR) tablet 20 mg  20 mg Oral Daily Gillis Santa, MD   20 mg at 12/28/22 2030   azithromycin (ZITHROMAX) tablet 500 mg  500 mg Oral Daily Gillis Santa, MD   500 mg at 12/28/22 0901   chlorpheniramine-HYDROcodone (TUSSIONEX) 10-8 MG/5ML suspension 5 mL  5 mL Oral Q12H PRN Gillis Santa, MD       fluticasone furoate-vilanterol (BREO ELLIPTA) 200-25 MCG/ACT 1 puff  1 puff Inhalation Daily Gillis Santa, MD   1 puff at 12/28/22 0901    guaiFENesin (MUCINEX) 12 hr tablet 600 mg  600 mg Oral BID Gillis Santa, MD   600 mg at 12/28/22 2030   insulin aspart (novoLOG) injection 0-9 Units  0-9 Units Subcutaneous Q4H Gillis Santa, MD   1 Units at 12/29/22 0904   ipratropium-albuterol (DUONEB) 0.5-2.5 (3) MG/3ML nebulizer solution 3 mL  3 mL Nebulization Q4H PRN Gillis Santa, MD       iron polysaccharides (NIFEREX) capsule 150 mg  150 mg Oral Daily Gillis Santa, MD   150 mg at 12/28/22 0901   metoprolol tartrate (LOPRESSOR) tablet 25 mg  25 mg Oral BID Gillis Santa, MD   25 mg at 12/28/22 0901   ondansetron (ZOFRAN) tablet 4 mg  4 mg Oral Q6H PRN Rometta Emery, MD       Or   ondansetron (ZOFRAN) injection 4 mg  4 mg Intravenous Q6H PRN Rometta Emery, MD       Oral care mouth rinse  15 mL Mouth Rinse PRN Delfino Lovett, MD       pantoprazole (PROTONIX) EC tablet 40 mg  40 mg Oral BID AC Delfino Lovett, MD   40 mg at 12/28/22 1803   simethicone (MYLICON) chewable tablet 80 mg  80 mg Oral Q6H PRN Gillis Santa, MD       sodium bicarbonate tablet 650 mg  650 mg Oral TID Gillis Santa, MD   650 mg at 12/28/22 2029   traZODone (DESYREL) tablet 50 mg  50 mg Oral QHS Gillis Santa, MD   50 mg at 12/28/22 2030   Vitamin D (Ergocalciferol) (DRISDOL) 1.25 MG (50000 UNIT) capsule 50,000 Units  50,000 Units Oral Q7 days Gillis Santa, MD   50,000 Units at 12/23/22 2206     Discharge Medications: Please see discharge summary for a list of discharge medications.  Relevant Imaging Results:  Relevant Lab Results:   Additional Information SSN: [id number]  Darolyn Rua, LCSW

## 2022-12-29 NOTE — Progress Notes (Signed)
New order for 500 ml NS bolus received from Dr. Sidney Ace.

## 2022-12-29 NOTE — Progress Notes (Signed)
[facility] Progress Note  Patient: Devin Washington    NWG:[id number]  DOA: 12/21/2022     Date of Service: the patient was seen and examined on 12/29/2022  Chief Complaint  Patient presents with   Respiratory Distress   Brief hospital course:  Devin Washington is a 89 y.o. male with medical history significant of atrial fibrillation, essential hypertension, chronic kidney disease stage IIIb, iron deficiency anemia, sick sinus syndrome status post pacemaker placement, hyperlipidemia, essential hypertension, chronic diastolic heart failure who was initially brought into the ER as a Devin Washington.  Patient was obtained there was significant hypoxic respiratory failure.  He is currently unable to give any history and is on BiPAP.  Patient arrived with oxygen sat of 62% on room air.  He was in respiratory distress on CPAP from EMS.  Workup so far showed severe sepsis with lactic acid of more than 9 and pH of 7.18.  Vitals are however stable.  Patient has chest x-ray that is negative.  Acute viral screen also negative.  No obvious source of sepsis.  At this point he is being admitted with acute hypoxic respiratory failure as well as severe sepsis of unknown source.  Workup is ongoing to determine the source.    Assessment and Plan:  # Severe sepsis with endorgan damage: Patient presented with severe hypoxic respiratory failure, although no evidence of pneumonia on chest x-ray.   CT C/a/p: Peripheral reticulations with bibasilar subpleural cystic changes suggestive of interstitial lung disease/pulmonary fibrosis. Urinalysis was not very impressive, urine culture multiple species, contamination Continue cefepime  (d/c'd Vanc & Flagyl) S/p vancomycin DC'd on 1/9, MRSA PCR negative, blood culture negative, WBC improving.  Procalcitonin 22 elevated at admission 1/12 CXR: Stable cardiac enlargement and chronic lung disease. No acute findings. 1/12 discontinued Flagyl, started empiric azithromycin 500 mg  p.o. daily for 5 days Trend WBC count 1/15 follow chest x-ray to rule out aspiration pneumonia and pleural effusion   Right lower extremity DVT Duplex shows DVT in the posterior tibial vein Vascular surgery consulted, recommended Eliquis 2.5 milligram p.o. 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  Recommend Eliquis 2.5 mg twice daily for 3 months. got opinion via secure chat, no official consult done.  Follow-up outpatient    # Acute hypoxic respiratory failure with COPD exacerbation S/p BiPAP.  Continue antibiotics treatment and titrate of BiPAP S/p IV Solu-Medrol, discontinued steroids on 1/9 due to low hemoglobin, possible GI bleed.  Patient's respiratory failure resolved, currently saturating well on room air. Started Breo Ellipta inhaler, DuoNeb every 6 hourly scheduled followed by as needed Started Mucinex extremity gram p.o. twice daily and Tussionex for cough as needed   # Elevated troponin, type II MI most likely due to demand ischemia Troponin 189----476  continue to trend Continue to monitor on telemetry TTE shows LVEF 40 to 57%, grade 1 diastolic dysfunction, LV demonstrate regional wall motion abnormality EKG shows paced rhythm Patient remained chest pain-free Cardiology consulted, recommended no ischemic cardiac workup at this stage Started aspirin    # Anemia due to iron deficiency, monitor H&H Iron level 19, transferrin saturation 84% Folic acid 9.5 WNL Start oral iron supplement when stable, avoid IV iron due to active infection 1/9, Hb 6.4, transfuse 1 unit of PRBC 1/15 Hb 7.9 gradually dropping S/p Pantoprazole 40 mg IV BID, switch to p.o. Check FOBT   # Metabolic acidosis CO2 69--62--95--28  S/p Sodium bicarbonate IV push given,  and s/p sodium bicarbonate IV infusion till 1/12, transition to oral bicarbonate Monitor BMP daily   # AKI on Chronic kidney disease stage  III: Hydrate.  Monitor renal function Creatinine 3.75--3.2--2.6--2.5---3.69  Monitor renal foods daily, monitor urine output Avoid nephrotoxic medications, use renal dose medications 1/14 US renal:  No evidence of obstructive uropathy. Mildly increased renal echogenicity as can be seen in the setting of medical renal disease. Bilateral pleural effusions. 1/15 again renal functions are deteriorating most likely due to dehydration, encouraged patient for oral hydration Started IV fluid NS 75 mill per hour for 1 day Bladder scan negative for any urinary retention,    # Hypomagnesemia, mag repleted.  Resolved Monitor and replete as needed.   # severe lactic acidosis: Aggressively hydrate.  Continue antibiotics Lactic acidosis resolved, lactic acid 1.6 within normal range now. # sick sinus syndrome: Status post pacemaker placement.  Rate is now controlled.   # paroxysmal atrial fibrillation: Rate is controlled.  Home regimen.   # acute metabolic encephalopathy: Secondary to severe sepsis.  Resolved, patient is AOx3   # Pulmonary nodule, incidental finding on CT scan CT chest: Lingular subpleural 1.3 x 1 cm nodule. Consider one of the following in 3 months for both low-risk and high-risk individuals: (a) repeat chest CT, (b) follow-up PET-CT, or (c) tissue sampling. Patient was recommended to follow with PCP and pulmonary as an outpatient  PAD, abdominal aortic aneurysm CT A/P: Stable aneurysmal bilateral common iliac arteries. 6. Stable aneurysmal infrarenal abdominal aorta (3.4 cm). Recommend annual imaging followup by CTA or MRA.  Patient was recommended to follow with PCP for incidental findings as above  Vitamin D insufficiency: started vitamin D 50,000 units p.o. weekly, follow with PCP to repeat vitamin D level after 3 to 6 months.  Body mass index is 21.81 kg/m.  Interventions:    PT and OT eval done, recommend SNF placement     Diet: Heart healthy diet DVT Prophylaxis:  Subcutaneous Heparin    Advance goals of care discussion: DNR  Family Communication: family was present at bedside, at the time of interview.  The pt provided permission to discuss medical plan with the family. Opportunity was given to ask question and all questions were answered satisfactorily.   Disposition:  Pt is from Home, admitted with sepsis, AKI, still creatinine is elevated, started on IV fluid, WBC elevated, which precludes a safe discharge. Discharge to SNF, when clinically stable, may need 1-2 more days to improve.  Subjective:  No significant overnight events, patient was sleepy, woke up and he states that he is feeling fine, denies any active issues.  Patient is sleeping more and not drinking enough water, encouraged oral intake, and advised that we are starting IV fluid.   Physical Exam: General: lying comfortably, sleepy, mild  SOB Appear in no distress, affect appropriate Eyes: PERRLA ENT: Oral Mucosa Clear, moist  Neck: no JVD,  Cardiovascular: S1 and S2 Present, no Murmur,  Respiratory: good respiratory effort, Bilateral Air entry equal and Decreased, mild Crackles and mild wheezing bilaterally Abdomen: Bowel Sound present, Soft and no tenderness,  Skin: no rashes Extremities: RLE no Pedal edema, no calf tenderness, left calf thrombosed chronic aneurysm Neurologic: without any new focal findings Gait not checked due to patient safety concerns  Vitals:   12/29/22 0507 12/29/22 0509 12/29/22 0747 12/29/22 1100  BP: (!) 98/40 (!) 95/44 (!) 106/53 (!) 106/46  Pulse: 67 63 72 66  Resp:   18 18  Temp: 98.4 F (  36.9 C)  97.6 F (36.4 C)   TempSrc: Oral  Oral   SpO2:   100%   Weight:      Height:        Intake/Output Summary (Last 24 hours) at 12/29/2022 1411 Last data filed at 12/29/2022 0700 Gross per 24 hour  Intake 1105.64 ml  Output 600 ml  Net 505.64 ml   Filed Weights   12/22/22 0019  Weight: 68.9 kg    Data Reviewed: I have personally reviewed  and interpreted daily labs, tele strips, imagings as discussed above. I reviewed all nursing notes, pharmacy notes, vitals, pertinent old records I have discussed plan of care as described above with RN and patient/family.  CBC: Recent Labs  Lab 12/25/22 0500 12/26/22 0639 12/27/22 0436 12/28/22 0526 12/29/22 0308  WBC 12.9* 14.0* 15.4* 17.6* 15.3*  HGB 8.6* 8.9* 8.8* 8.8* 7.9*  HCT 26.5* 27.4* 27.2* 27.1* 23.6*  MCV 90.8 89.5 89.8 90.0 88.4  PLT 159 162 176 202 366   Basic Metabolic Panel: Recent Labs  Lab 12/23/22 0442 12/24/22 0420 12/25/22 0500 12/26/22 0639 12/27/22 0436 12/28/22 0526 12/29/22 0308  NA 135 138 139 137 137 134* 135  K 4.0 3.7 3.5 3.6 3.7 4.0 3.8  CL 110 109 111 109 108 108 108  CO2 15* 21* 17* 21* 22 17* 21*  GLUCOSE 109* 157* 92 120* 98 106* 120*  BUN 44* 48* 43* 46* 54* 60* 70*  CREATININE 2.36* 2.60* 2.52* 2.67* 2.92* 3.22* 3.69*  CALCIUM 8.2* 8.3* 7.8* 7.9* 8.0* 7.7* 8.0*  MG 1.5* 2.1 1.9 2.0  --   --  1.9  PHOS 3.9 3.3 2.8 2.4*  --   --   --     Studies: US RENAL  Result Date: 12/28/2022 CLINICAL DATA:  [id number] Worsening renal function [id number] EXAM: RENAL / URINARY TRACT ULTRASOUND COMPLETE COMPARISON:  December 21, 2022 FINDINGS: Right Kidney: Renal measurements: 9.1 x 4.2 x 4.9 cm = volume: 96 mL. Mildly increased renal echogenicity. No suspicious mass or hydronephrosis visualized. Benign cyst of the inferior pole measures up to 10 mm (for which no dedicated imaging follow-up is recommended) . Left Kidney: Renal measurements: 10.5 x 4.5 x 4.4 cm = volume: 109 mL. Mildly increased renal echogenicity. No suspicious mass or hydronephrosis visualized. Incidental note of several benign cysts, the largest of which is a benign exophytic cyst measuring up to 17 mm (for which no dedicated imaging follow-up is recommended) . Bladder: Mildly trabeculated appearance likely reflecting sequela of chronic outlet obstruction. Other: Bilateral pleural effusions.  IMPRESSION: 1. No evidence of obstructive uropathy. 2. Mildly increased renal echogenicity as can be seen in the setting of medical renal disease. 3. Bilateral pleural effusions. Electronically Signed   By: Valentino Saxon M.D.   On: 12/28/2022 18:15    Scheduled Meds:  sodium chloride   Intravenous Once   [START ON 12/30/2022] amLODipine  10 mg Oral Daily   apixaban  2.5 mg Oral BID   vitamin C  500 mg Oral Daily   aspirin EC  81 mg Oral Daily   atorvastatin  20 mg Oral Daily   azithromycin  500 mg Oral Daily   fluticasone furoate-vilanterol  1 puff Inhalation Daily   guaiFENesin  600 mg Oral BID   insulin aspart  0-9 Units Subcutaneous Q4H   iron polysaccharides  150 mg Oral Daily   metoprolol tartrate  25 mg Oral BID   pantoprazole  40 mg Oral BID AC  sodium bicarbonate  650 mg Oral TID   traZODone  25 mg Oral QHS   Vitamin D (Ergocalciferol)  50,000 Units Oral Q7 days   Continuous Infusions:  sodium chloride 75 mL/hr at 12/29/22 0904   PRN Meds: acetaminophen **OR** acetaminophen, albuterol, chlorpheniramine-HYDROcodone, ipratropium-albuterol, ondansetron **OR** ondansetron (ZOFRAN) IV, mouth rinse, simethicone  Time spent: 50 minutes  Author: Val Riles. MD [facility] 12/29/2022 2:11 PM  To reach On-call, see care teams to locate the attending and reach out to them via [email or web address]. If 7PM-7AM, please contact night-coverage If you still have difficulty reaching the attending provider, please page the [facility] (Director on Call) for [facility] on amion for assistance.

## 2022-12-29 NOTE — Progress Notes (Signed)
[facility] Cardiology    SUBJECTIVE: Patient resting comfortably sleeping somewhat lethargic but arousable states that shortness of breath is much improved   Vitals:   12/29/22 0507 12/29/22 0509 12/29/22 0747 12/29/22 1100  BP: (!) 98/40 (!) 95/44 (!) 106/53 (!) 106/46  Pulse: 67 63 72 66  Resp:   18 18  Temp: 98.4 F (36.9 C)  97.6 F (36.4 C)   TempSrc: Oral  Oral   SpO2:   100%   Weight:      Height:         Intake/Output Summary (Last 24 hours) at 12/29/2022 1624 Last data filed at 12/29/2022 0700 Gross per 24 hour  Intake 1105.64 ml  Output 600 ml  Net 505.64 ml      PHYSICAL EXAM  General: Well developed, well nourished, in no acute distress HEENT:  Normocephalic and atramatic Neck:  No JVD.  Lungs: Clear bilaterally to auscultation and percussion. Heart: HRRR . Normal S1 and S2 without gallops or murmurs.  Abdomen: Bowel sounds are positive, abdomen soft and non-tender  Msk:  Back normal, normal gait. Normal strength and tone for age. Extremities: No clubbing, cyanosis or edema.   Neuro: Alert and oriented X 3. Psych:  Good affect, responds appropriately   LABS: Basic Metabolic Panel: Recent Labs    12/28/22 0526 12/29/22 0308  NA 134* 135  K 4.0 3.8  CL 108 108  CO2 17* 21*  GLUCOSE 106* 120*  BUN 60* 70*  CREATININE 3.22* 3.69*  CALCIUM 7.7* 8.0*  MG  --  1.9   Liver Function Tests: No results for input(s): "AST", "ALT", "ALKPHOS", "BILITOT", "PROT", "ALBUMIN" in the last 72 hours. No results for input(s): "LIPASE", "AMYLASE" in the last 72 hours. CBC: Recent Labs    12/28/22 0526 12/29/22 0308  WBC 17.6* 15.3*  HGB 8.8* 7.9*  HCT 27.1* 23.6*  MCV 90.0 88.4  PLT 202 220   Cardiac Enzymes: No results for input(s): "CKTOTAL", "CKMB", "CKMBINDEX", "TROPONINI" in the last 72 hours. BNP: Invalid input(s): "POCBNP" D-Dimer: No results for input(s): "DDIMER" in the last 72 hours. Hemoglobin A1C: No results for input(s): "HGBA1C" in the last  72 hours. Fasting Lipid Panel: No results for input(s): "CHOL", "HDL", "LDLCALC", "TRIG", "CHOLHDL", "LDLDIRECT" in the last 72 hours. Thyroid Function Tests: No results for input(s): "TSH", "T4TOTAL", "T3FREE", "THYROIDAB" in the last 72 hours.  Invalid input(s): "FREET3" Anemia Panel: No results for input(s): "VITAMINB12", "FOLATE", "FERRITIN", "TIBC", "IRON", "RETICCTPCT" in the last 72 hours.  CT HEAD WO CONTRAST (5MM)  Result Date: 12/29/2022 CLINICAL DATA:  Atrial fibrillation, hypertension, neurologic deficit EXAM: CT HEAD WITHOUT CONTRAST TECHNIQUE: Contiguous axial images were obtained from the base of the skull through the vertex without intravenous contrast. RADIATION DOSE REDUCTION: This exam was performed according to the departmental dose-optimization program which includes automated exposure control, adjustment of the mA and/or kV according to patient size and/or use of iterative reconstruction technique. COMPARISON:  01/30/2021 FINDINGS: Brain: Stable chronic small-vessel ischemic changes within the periventricular white matter. No evidence of acute infarct or hemorrhage. Stable cerebral atrophy, with ex vacuo dilatation of the lateral ventricles. Remaining midline structures are unremarkable. No acute extra-axial fluid collections. No mass effect. Vascular: Stable atherosclerosis.  No hyperdense vessel. Skull: Normal. Negative for fracture or focal lesion. Sinuses/Orbits: No acute finding. Other: None. IMPRESSION: 1. Stable head CT, no acute intracranial process. Electronically Signed   By: Randa Ngo M.D.   On: 12/29/2022 15:48   DG Chest Port 1  View  Result Date: 12/29/2022 CLINICAL DATA:  Pleural effusion EXAM: PORTABLE CHEST 1 VIEW COMPARISON:  12/26/2022, CT 12/21/2022 FINDINGS: Emphysema. Left-sided pacing device as before. Cardiomegaly with aortic atherosclerosis. Nodular opacity in the left mid lung. Possible small left effusion. Increased airspace disease at left lung  base. IMPRESSION: 1. Increased airspace disease at the left base may be due to atelectasis or pneumonia. Possible small left effusion. 2. Nodular opacity in the left mid lung, this appears to correspond to the possible nodule on chest CT, reference report 12/21/2022 for additional recommendations. 3. Cardiomegaly. 4. Emphysema. Electronically Signed   By: Donavan Foil M.D.   On: 12/29/2022 15:24   US RENAL  Result Date: 12/28/2022 CLINICAL DATA:  [id number] Worsening renal function [id number] EXAM: RENAL / URINARY TRACT ULTRASOUND COMPLETE COMPARISON:  December 21, 2022 FINDINGS: Right Kidney: Renal measurements: 9.1 x 4.2 x 4.9 cm = volume: 96 mL. Mildly increased renal echogenicity. No suspicious mass or hydronephrosis visualized. Benign cyst of the inferior pole measures up to 10 mm (for which no dedicated imaging follow-up is recommended) . Left Kidney: Renal measurements: 10.5 x 4.5 x 4.4 cm = volume: 109 mL. Mildly increased renal echogenicity. No suspicious mass or hydronephrosis visualized. Incidental note of several benign cysts, the largest of which is a benign exophytic cyst measuring up to 17 mm (for which no dedicated imaging follow-up is recommended) . Bladder: Mildly trabeculated appearance likely reflecting sequela of chronic outlet obstruction. Other: Bilateral pleural effusions. IMPRESSION: 1. No evidence of obstructive uropathy. 2. Mildly increased renal echogenicity as can be seen in the setting of medical renal disease. 3. Bilateral pleural effusions. Electronically Signed   By: Valentino Saxon M.D.   On: 12/28/2022 18:15     Echo mildly depressed left ventricular function EF around 40 to 45%  TELEMETRY: Paced rhythm of 60:  ASSESSMENT AND PLAN:  Principal Problem:   Severe sepsis with acute organ dysfunction (HCC) Active Problems:   Acute on chronic diastolic (congestive) heart failure (HCC)   Presence of cardiac pacemaker   Sick sinus syndrome (HCC)   Chronic kidney disease,  stage 3b (HCC)   Paroxysmal atrial fibrillation (HCC)   Iron deficiency anemia, unspecified   Hyperlipidemia    Plan Sepsis severe continue current therapy as per medical team still has elevated white count Acute on chronic renal insufficiency with worsening GFR consider nephrology input for management Primary pacemaker in place appears to be functioning adequately no indication to interrogate pacemaker Chronic systolic congestive heart failure with diastolic dysfunction patient denies shortness of breath much improved but worsening renal function Paroxysmal atrial fibrillation rate controlled permanent pacemaker in place continue Eliquis for anticoagulation Hyperlipidemia continue current medical management Continue conservative cardiology input at this stage    Yolonda Kida, MD 12/29/2022 4:24 PM

## 2022-12-29 NOTE — TOC Initial Note (Signed)
Transition of Care [facility]) - Initial/Assessment Note    Patient Details  Name: Devin Washington MRN: [id number] Date of Birth: 15-Dec-1923  [facility]) CM/SW Contact:    Tiburcio Bash, LCSW Phone Number: 12/29/2022, 10:24 AM  Clinical Narrative:                  CSW spoke with patient's granddaughter Kenney Houseman she reports they are interested in local SNF facilities and in agreement with short term rehab recommendations. She reports that patient has hx of going to [facility] which is preference.   CSW has sent out referrals and informed Magda Paganini with Admissions at [facility] of preference. Pending bed offers at this time.   Expected Discharge Plan: Skilled Nursing Facility Barriers to Discharge: Continued Medical Work up   Patient Goals and CMS Choice Patient states their goals for this hospitalization and ongoing recovery are:: to go home CMS Medicare.gov Compare Post Acute Care list provided to:: Patient Represenative (must comment) (grandaughter tanya)        Expected Discharge Plan and Services                                              Prior Living Arrangements/Services                       Activities of Daily Living Home Assistive Devices/Equipment: Environmental consultant (specify type), Eyeglasses, Dentures (specify type) ADL Screening (condition at time of admission) Patient's cognitive ability adequate to safely complete daily activities?: Yes Is the patient deaf or have difficulty hearing?: No Does the patient have difficulty seeing, even when wearing glasses/contacts?: No Does the patient have difficulty concentrating, remembering, or making decisions?: No Patient able to express need for assistance with ADLs?: Yes Does the patient have difficulty dressing or bathing?: No Independently performs ADLs?: Yes (appropriate for developmental age) Does the patient have difficulty walking or climbing stairs?: Yes Weakness of Legs: Both Weakness  of Arms/Hands: None  Permission Sought/Granted                  Emotional Assessment              Admission diagnosis:  Acute respiratory failure with hypoxia ([place]) [J96.01] AKI (acute kidney injury) ([place]) [N17.9] Septic shock ([place]) [A41.9, R65.21] Severe sepsis with acute organ dysfunction ([place]) [A41.9, R65.20] Patient Active Problem List   Diagnosis Date Noted   Severe sepsis with acute organ dysfunction ([place]) 12/21/2022   Hyperlipidemia 12/21/2022   Acute on chronic diastolic (congestive) heart failure ([place]) 12/15/2020   Presence of cardiac pacemaker 12/15/2020   Sick sinus syndrome ([place]) 12/15/2020   Chronic kidney disease, stage 3b ([place]) 12/15/2020   Paroxysmal atrial fibrillation ([place]) 12/15/2020   Iron deficiency anemia, unspecified 12/15/2020   PCP:  Pcp, No Pharmacy:  No Pharmacies Listed    Social Determinants of Health (SDOH) Social History: SDOH Screenings   Food Insecurity: No Food Insecurity (12/22/2022)  Housing: Low Risk  (12/22/2022)  Transportation Needs: No Transportation Needs (12/22/2022)  Utilities: Not At Risk (12/22/2022)  Tobacco Use: Medium Risk (12/22/2022)   SDOH Interventions:     Readmission Risk Interventions     No data to display

## 2022-12-29 NOTE — Progress Notes (Signed)
Physical Therapy Treatment Patient Details Name: Devin Washington MRN: [id number] DOB: Nov 27, 1923 Today's Date: 12/29/2022   History of Present Illness Pt is a 89 y/o M admitted on 12/21/22 after presenting with acute hypoxic repsiratory failure & placed on BiPAP. Work up revealed sepsis of unknown source. Pt also found to have DVT. PMH: a-fib, HTN, chronic renal sufficiency, anemia, SSS, permanent pacemaker, HLD, DM, aortic stenosis    PT Comments    Pt received upright in bed with head slumped against L bed rail. Pt awakens to voice. Reports feeling "worse" today than previous day stating he is "dizzy". Pt with difficulty keeping eyes open throughout session but agreeable to trial orthostatic readings and get to recliner. Supine to sit vitals negative, unable to assess standing trial as pt impulsively transfers to recliner despite mod multimodal cuing. Throughout mobility pt reliant on modA for bed mobility at torso and LE's, modA to stand to RW with inability to attain upright standing with RW needing consistent modA at torso for safe transfer from bed to chair then chair to bed. Unable to maintain upright sitting in recliner with consistent posterior sliding thus need for transfer back to bed due to fear of pt sliding out of recliner. Pillow support provided in bed to maintain upright sitting in semi reclined position. All needs in reach with RN updated on pt status. D/c recs remain appropriate   Supine vitals: 106/46 mm Hg, HR: 66 BPM  Sitting vitals: 121/55 mm Hg, HR: 67 BPM    Recommendations for follow up therapy are one component of a multi-disciplinary discharge planning process, led by the attending physician.  Recommendations may be updated based on patient status, additional functional criteria and insurance authorization.  Follow Up Recommendations  Skilled nursing-short term rehab (<3 hours/day) Can patient physically be transported by private vehicle: No   Assistance Recommended at  Discharge Frequent or constant Supervision/Assistance  Patient can return home with the following Assist for transportation;Help with stairs or ramp for entrance;A little help with walking and/or transfers;Assistance with cooking/housework   Equipment Recommendations  None recommended by PT    Recommendations for Other Services       Precautions / Restrictions Precautions Precautions: Fall Restrictions Weight Bearing Restrictions: No     Mobility  Bed Mobility Overal bed mobility: Needs Assistance Bed Mobility: Supine to Sit     Supine to sit: HOB elevated, Mod assist       Patient Response: Cooperative  Transfers Overall transfer level: Needs assistance Equipment used: Rolling walker (2 wheels) Transfers: Bed to chair/wheelchair/BSC Sit to Stand: Mod assist, From elevated surface   Step pivot transfers: Mod assist, From elevated surface       General transfer comment: modA+1 with transfers this date    Ambulation/Gait               General Gait Details: deferred due to difficulty with transfers   Stairs             Wheelchair Mobility    Modified Rankin (Stroke Patients Only)       Balance Overall balance assessment: Needs assistance Sitting-balance support: Feet supported, No upper extremity supported Sitting balance-Leahy Scale: Fair     Standing balance support: Bilateral upper extremity supported, During functional activity, Reliant on assistive device for balance Standing balance-Leahy Scale: Poor                              Cognition  Arousal/Alertness: Lethargic Behavior During Therapy: WFL for tasks assessed/performed                                   General Comments: lethargic today, difficulty keeping eyes open. Seems hard of hearing limiting ability to follow commands.        Exercises      General Comments        Pertinent Vitals/Pain Pain Assessment Pain Assessment: No/denies pain     Home Living                          Prior Function            PT Goals (current goals can now be found in the care plan section) Acute Rehab PT Goals Patient Stated Goal: get better, stronger PT Goal Formulation: With patient Time For Goal Achievement: 01/09/23 Potential to Achieve Goals: Fair Progress towards PT goals: Not progressing toward goals - comment (more lethargic, increased assist needs this date)    Frequency    Min 2X/week      PT Plan Current plan remains appropriate    Co-evaluation              AM-PAC PT "6 Clicks" Mobility   Outcome Measure  Help needed turning from your back to your side while in a flat bed without using bedrails?: A Little Help needed moving from lying on your back to sitting on the side of a flat bed without using bedrails?: A Little Help needed moving to and from a bed to a chair (including a wheelchair)?: A Lot Help needed standing up from a chair using your arms (e.g., wheelchair or bedside chair)?: A Lot Help needed to walk in hospital room?: A Lot Help needed climbing 3-5 steps with a railing? : Total 6 Click Score: 13    End of Session Equipment Utilized During Treatment: Gait belt Activity Tolerance: Patient limited by lethargy Patient left: in bed;with call bell/phone within reach;with bed alarm set Nurse Communication: Mobility status PT Visit Diagnosis: Muscle weakness (generalized) (M62.81);Difficulty in walking, not elsewhere classified (R26.2);Unsteadiness on feet (R26.81)     Time: 1610-9604 PT Time Calculation (min) (ACUTE ONLY): 26 min  Charges:  $Therapeutic Activity: 23-37 mins                    Sherise Geerdes M. Fairly IV, PT, DPT Physical Therapist- IXL  [facility]  12/29/2022, 12:45 PM

## 2022-12-30 DIAGNOSIS — R6520 Severe sepsis without septic shock: Secondary | ICD-10-CM | POA: Diagnosis not present

## 2022-12-30 DIAGNOSIS — A419 Sepsis, unspecified organism: Secondary | ICD-10-CM | POA: Diagnosis not present

## 2022-12-30 LAB — CBC
HCT: 25 % — ABNORMAL LOW (ref 39.0–52.0)
Hemoglobin: 8.1 g/dL — ABNORMAL LOW (ref 13.0–17.0)
MCH: 28.9 pg (ref 26.0–34.0)
MCHC: 32.4 g/dL (ref 30.0–36.0)
MCV: 89.3 fL (ref 80.0–100.0)
Platelets: 269 10*3/uL (ref 150–400)
RBC: 2.8 MIL/uL — ABNORMAL LOW (ref 4.22–5.81)
RDW: 16.1 % — ABNORMAL HIGH (ref 11.5–15.5)
WBC: 12.9 10*3/uL — ABNORMAL HIGH (ref 4.0–10.5)
nRBC: 0 % (ref 0.0–0.2)

## 2022-12-30 LAB — BASIC METABOLIC PANEL
Anion gap: 9 (ref 5–15)
BUN: 75 mg/dL — ABNORMAL HIGH (ref 8–23)
CO2: 20 mmol/L — ABNORMAL LOW (ref 22–32)
Calcium: 8.3 mg/dL — ABNORMAL LOW (ref 8.9–10.3)
Chloride: 106 mmol/L (ref 98–111)
Creatinine, Ser: 3.97 mg/dL — ABNORMAL HIGH (ref 0.61–1.24)
GFR, Estimated: 13 mL/min — ABNORMAL LOW (ref >60)
Glucose, Bld: 115 mg/dL — ABNORMAL HIGH (ref 70–99)
Potassium: 4 mmol/L (ref 3.5–5.1)
Sodium: 135 mmol/L (ref 135–145)

## 2022-12-30 LAB — GLUCOSE, CAPILLARY
Glucose-Capillary: 100 mg/dL — ABNORMAL HIGH (ref 70–99)
Glucose-Capillary: 101 mg/dL — ABNORMAL HIGH (ref 70–99)
Glucose-Capillary: 115 mg/dL — ABNORMAL HIGH (ref 70–99)
Glucose-Capillary: 119 mg/dL — ABNORMAL HIGH (ref 70–99)
Glucose-Capillary: 96 mg/dL (ref 70–99)
Glucose-Capillary: 99 mg/dL (ref 70–99)

## 2022-12-30 LAB — PROCALCITONIN: Procalcitonin: 7.93 ng/mL

## 2022-12-30 LAB — PHOSPHORUS: Phosphorus: 4.9 mg/dL — ABNORMAL HIGH (ref 2.5–4.6)

## 2022-12-30 LAB — MAGNESIUM: Magnesium: 2 mg/dL (ref 1.7–2.4)

## 2022-12-30 MED ORDER — SODIUM CHLORIDE 0.9 % IV SOLN: INTRAVENOUS | Status: DC

## 2022-12-30 MED ORDER — MIDODRINE HCL 5 MG PO TABS
10.0000 mg | ORAL_TABLET | Freq: Three times a day (TID) | ORAL | Status: DC
  Administered 2022-12-30 – 2022-12-31 (×4): 10 mg via ORAL
  Filled 2022-12-30 (×5): qty 2

## 2022-12-30 NOTE — Progress Notes (Signed)
[facility] Cardiology    SUBJECTIVE: Sleeping in bed resting comfortably no significant problems with   Vitals:   12/30/22 1004 12/30/22 1345 12/30/22 1445 12/30/22 1645  BP:  (!) 109/51 (!) 114/45 (!) 97/44  Pulse:  71 (!) 51 65  Resp:  20 (!) 21 18  Temp:  97.6 F (36.4 C) 98.1 F (36.7 C) (!) 97.5 F (36.4 C)  TempSrc:    Oral  SpO2: 97% 99% 100% 100%  Weight:      Height:         Intake/Output Summary (Last 24 hours) at 12/30/2022 1759 Last data filed at 12/30/2022 1652 Gross per 24 hour  Intake 1307.69 ml  Output 250 ml  Net 1057.69 ml      PHYSICAL EXAM  General: Well developed, well nourished, in no acute distress HEENT:  Normocephalic and atramatic Neck:  No JVD.  Lungs: Clear bilaterally to auscultation and percussion. Heart: HRRR . Normal S1 and S2 without gallops or 2/6 sem murmurs.  Abdomen: Bowel sounds are positive, abdomen soft and non-tender  Msk:  Back normal, normal gait. Normal strength and tone for age. Extremities: No clubbing, cyanosis or edema.   Neuro: Alert and oriented X 3. Psych:  Good affect, responds appropriately   LABS: Basic Metabolic Panel: Recent Labs    12/29/22 0308 12/30/22 0502  NA 135 135  K 3.8 4.0  CL 108 106  CO2 21* 20*  GLUCOSE 120* 115*  BUN 70* 75*  CREATININE 3.69* 3.97*  CALCIUM 8.0* 8.3*  MG 1.9 2.0  PHOS  --  4.9*   Liver Function Tests: No results for input(s): "AST", "ALT", "ALKPHOS", "BILITOT", "PROT", "ALBUMIN" in the last 72 hours. No results for input(s): "LIPASE", "AMYLASE" in the last 72 hours. CBC: Recent Labs    12/29/22 0308 12/30/22 0502  WBC 15.3* 12.9*  HGB 7.9* 8.1*  HCT 23.6* 25.0*  MCV 88.4 89.3  PLT 220 269   Cardiac Enzymes: No results for input(s): "CKTOTAL", "CKMB", "CKMBINDEX", "TROPONINI" in the last 72 hours. BNP: Invalid input(s): "POCBNP" D-Dimer: No results for input(s): "DDIMER" in the last 72 hours. Hemoglobin A1C: No results for input(s): "HGBA1C" in the last 72  hours. Fasting Lipid Panel: No results for input(s): "CHOL", "HDL", "LDLCALC", "TRIG", "CHOLHDL", "LDLDIRECT" in the last 72 hours. Thyroid Function Tests: No results for input(s): "TSH", "T4TOTAL", "T3FREE", "THYROIDAB" in the last 72 hours.  Invalid input(s): "FREET3" Anemia Panel: No results for input(s): "VITAMINB12", "FOLATE", "FERRITIN", "TIBC", "IRON", "RETICCTPCT" in the last 72 hours.  CT HEAD WO CONTRAST (5MM)  Result Date: 12/29/2022 CLINICAL DATA:  Atrial fibrillation, hypertension, neurologic deficit EXAM: CT HEAD WITHOUT CONTRAST TECHNIQUE: Contiguous axial images were obtained from the base of the skull through the vertex without intravenous contrast. RADIATION DOSE REDUCTION: This exam was performed according to the departmental dose-optimization program which includes automated exposure control, adjustment of the mA and/or kV according to patient size and/or use of iterative reconstruction technique. COMPARISON:  01/30/2021 FINDINGS: Brain: Stable chronic small-vessel ischemic changes within the periventricular white matter. No evidence of acute infarct or hemorrhage. Stable cerebral atrophy, with ex vacuo dilatation of the lateral ventricles. Remaining midline structures are unremarkable. No acute extra-axial fluid collections. No mass effect. Vascular: Stable atherosclerosis.  No hyperdense vessel. Skull: Normal. Negative for fracture or focal lesion. Sinuses/Orbits: No acute finding. Other: None. IMPRESSION: 1. Stable head CT, no acute intracranial process. Electronically Signed   By: Randa Ngo M.D.   On: 12/29/2022 15:48  DG Chest Port 1 View  Result Date: 12/29/2022 CLINICAL DATA:  Pleural effusion EXAM: PORTABLE CHEST 1 VIEW COMPARISON:  12/26/2022, CT 12/21/2022 FINDINGS: Emphysema. Left-sided pacing device as before. Cardiomegaly with aortic atherosclerosis. Nodular opacity in the left mid lung. Possible small left effusion. Increased airspace disease at left lung base.  IMPRESSION: 1. Increased airspace disease at the left base may be due to atelectasis or pneumonia. Possible small left effusion. 2. Nodular opacity in the left mid lung, this appears to correspond to the possible nodule on chest CT, reference report 12/21/2022 for additional recommendations. 3. Cardiomegaly. 4. Emphysema. Electronically Signed   By: Donavan Foil M.D.   On: 12/29/2022 15:24     Echo pending  TELEMETRY: Paced rhythm 65:  ASSESSMENT AND PLAN:  Principal Problem:   Severe sepsis with acute organ dysfunction (HCC) Active Problems:   Acute on chronic diastolic (congestive) heart failure (HCC)   Presence of cardiac pacemaker   Sick sinus syndrome (HCC)   Chronic kidney disease, stage 3b (HCC)   Paroxysmal atrial fibrillation (H[facility])   Iron deficiency anemia, unspecified   Hyperlipidemia   Plan Sick sinus syndrome stable continue pacemaker in place and weekly functioning History of atrial fibrillation poor anticoagulation candidate from previous GI bleeding Anemia continue iron therapy for supplementation Hyperlipidemia statin therapy to help with lipid management Chronic systolic diastolic dysfunction reasonably compensated continue current therapy Sepsis elevated white count continue broad-spectrum antibiotic therapy adequate hydration Conservative cardiac input no invasive procedures or strategies recommended at this stage    Yolonda Kida, MD 12/30/2022 5:59 PM

## 2022-12-30 NOTE — Progress Notes (Signed)
The patient has not urinated the whole night. Bladder scan read 271 cc. Dr. Sidney Ace notified and received an order for In and out cath. Will do and continue to monitor.

## 2022-12-30 NOTE — Progress Notes (Signed)
In and out cath done with 250 ml clear urine output obtained. Patient tolerated well.

## 2022-12-30 NOTE — Progress Notes (Addendum)
[facility] Progress Note  Patient: Devin Washington    BSW:[id number]  DOA: 12/21/2022     Date of Service: the patient was seen and examined on 12/30/2022  Chief Complaint  Patient presents with   Respiratory Distress   Brief hospital course:  Devin Washington is a 89 y.o. male with medical history significant of atrial fibrillation, essential hypertension, chronic kidney disease stage IIIb, iron deficiency anemia, sick sinus syndrome status post pacemaker placement, hyperlipidemia, essential hypertension, chronic diastolic heart failure who was initially brought into the ER as a Devin Washington.  Patient was obtained there was significant hypoxic respiratory failure.  He is currently unable to give any history and is on BiPAP.  Patient arrived with oxygen sat of 62% on room air.  He was in respiratory distress on CPAP from EMS.  Workup so far showed severe sepsis with lactic acid of more than 9 and pH of 7.18.  Vitals are however stable.  Patient has chest x-ray that is negative.  Acute viral screen also negative.  No obvious source of sepsis.  At this point he is being admitted with acute hypoxic respiratory failure as well as severe sepsis of unknown source.  Workup is ongoing to determine the source.    Assessment and Plan:  # Severe sepsis with endorgan damage: Patient presented with severe hypoxic respiratory failure, although no evidence of pneumonia on chest x-ray.   CT C/a/p: Peripheral reticulations with bibasilar subpleural cystic changes suggestive of interstitial lung disease/pulmonary fibrosis. Urinalysis was not very impressive, urine culture multiple species, contamination Continue cefepime  (d/c'd Vanc & Flagyl) S/p vancomycin DC'd on 1/9, MRSA PCR negative, blood culture negative, WBC improving.  Procalcitonin 22 elevated at admission 1/12 CXR: Stable cardiac enlargement and chronic lung disease. No acute findings. 1/12 discontinued Flagyl, started empiric azithromycin 500 mg  p.o. daily for 5 days Trend WBC count 1/15 follow chest x-ray to rule out aspiration pneumonia and pleural effusion  # Hypertension and paroxysmal atrial fibrillation: Rate is controlled.  Home regimen. 1/16 patient was hypotensive, started midodrine 10 mg p.o. 3 times daily with holding parameters and discontinued amlodipine and metoprolol home meds Monitor BP and titrate medications accordingly   Right lower extremity DVT Duplex shows DVT in the posterior tibial vein Vascular surgery consulted, recommended Eliquis 2.5 milligram p.o. 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  Recommend Eliquis 2.5 mg twice daily for 3 months. got opinion via secure chat, no official consult done.  Follow-up outpatient    # Acute hypoxic respiratory failure with COPD exacerbation S/p BiPAP.  Continue antibiotics treatment and titrate of BiPAP S/p IV Solu-Medrol, discontinued steroids on 1/9 due to low hemoglobin, possible GI bleed.  Patient's respiratory failure resolved, currently saturating well on room air. Started Breo Ellipta inhaler, DuoNeb every 6 hourly scheduled followed by as needed Started Mucinex extremity gram p.o. twice daily and Tussionex for cough as needed   # Elevated troponin, type II MI most likely due to demand ischemia Troponin 189----476  continue to trend Continue to monitor on telemetry TTE shows LVEF 40 to 45%, grade 1 diastolic dysfunction, LV demonstrate regional wall motion abnormality EKG shows paced rhythm Patient remained chest pain-free Cardiology consulted, recommended no ischemic cardiac workup at this stage Started aspirin    # Anemia due to iron deficiency, monitor H&H Iron level 19, transferrin saturation 12% Folic acid 9.5 WNL Start oral iron supplement when stable, avoid IV iron due  to active infection 1/9, Hb 6.4, transfuse 1 unit of PRBC 1/15 Hb 7.9 gradually dropping S/p  Pantoprazole 40 mg IV BID, switch to p.o. Check FOBT   # Metabolic acidosis CO2 25--05--39--76 --20 S/p Sodium bicarbonate IV push given,  and s/p sodium bicarbonate IV infusion till 1/12, transition to oral bicarbonate Monitor BMP daily   # AKI on Chronic kidney disease stage III: Hydrate.  Monitor renal function Creatinine 3.75--3.2--2.6--2.5---3.69 --3.97 Monitor renal foods daily, monitor urine output Avoid nephrotoxic medications, use renal dose medications 1/14 US renal:  No evidence of obstructive uropathy. Mildly increased renal echogenicity as can be seen in the setting of medical renal disease. Bilateral pleural effusions. 1/15 again renal functions are deteriorating most likely due to dehydration, encouraged patient for oral hydration Started IV fluid NS 75 ml/hr Bladder scan negative for any urinary retention,    # Hypomagnesemia, mag repleted.  Resolved Monitor and replete as needed.   # severe lactic acidosis: Aggressively hydrate.  Continue antibiotics Lactic acidosis resolved, lactic acid 1.6 within normal range now. # sick sinus syndrome: Status post pacemaker placement.  Rate is now controlled.    # acute metabolic encephalopathy: Secondary to severe sepsis.  Resolved, patient is AOx3   # Pulmonary nodule, incidental finding on CT scan CT chest: Lingular subpleural 1.3 x 1 cm nodule. Consider one of the following in 3 months for both low-risk and high-risk individuals: (a) repeat chest CT, (b) follow-up PET-CT, or (c) tissue sampling. Patient was recommended to follow with PCP and pulmonary as an outpatient  PAD, abdominal aortic aneurysm CT A/P: Stable aneurysmal bilateral common iliac arteries. 6. Stable aneurysmal infrarenal abdominal aorta (3.4 cm). Recommend annual imaging followup by CTA or MRA.  Patient was recommended to follow with PCP for incidental findings as above  Vitamin D insufficiency: started vitamin D 50,000 units p.o. weekly, follow with  PCP to repeat vitamin D level after 3 to 6 months.  Body mass index is 21.81 kg/m.  Interventions:    PT and OT eval done, recommend SNF placement     Diet: Heart healthy diet DVT Prophylaxis: Subcutaneous Heparin    Advance goals of care discussion: DNR  Family Communication: family was not present at bedside, at the time of interview.  The pt provided permission to discuss medical plan with the family. Opportunity was given to ask question and all questions were answered satisfactorily.  1/15 called patient's granddaughter and left VM 1/16 discussed with patient's granddaughter over the phone, all question and concerns answered.  Disposition:  Pt is from Home, admitted with sepsis, AKI, still creatinine is elevated, started on IV fluid, WBC elevated, which precludes a safe discharge. Discharge to SNF, when clinically stable, may need 1-2 more days to improve.  Subjective:  No significant overnight events, today patient is more awake and alert, stated that he has decreased oral fluid intake, I explained to him that he is little bit dehydrated so we have to give him IV fluid he agreed with the plan.  RN was advised to check bladder scan to rule out urinary retention and monitor urine output.  Patient was in a stable condition, not in acute distress denies any active issues.   Physical Exam: General: lying comfortably, sleepy, mild  SOB Appear in no distress, affect appropriate Eyes: PERRLA ENT: Oral Mucosa Clear, moist  Neck: no JVD,  Cardiovascular: S1 and S2 Present, no Murmur,  Respiratory: good respiratory effort, Bilateral Air entry equal and Decreased, mild Crackles and mild  wheezing bilaterally Abdomen: Bowel Sound present, Soft and no tenderness,  Skin: no rashes Extremities: 2+ edema b/l LE, Left calf thrombosed chronic aneurysm Neurologic: without any new focal findings Gait not checked due to patient safety concerns  Vitals:   12/30/22 0803 12/30/22 1004 12/30/22  1345 12/30/22 1445  BP: (!) 93/56  (!) 109/51 (!) 114/45  Pulse: 77  71 (!) 51  Resp: 20  20 (!) 21  Temp: 97.6 F (36.4 C)  97.6 F (36.4 C) 98.1 F (36.7 C)  TempSrc: Oral     SpO2: 100% 97% 99% 100%  Weight:      Height:        Intake/Output Summary (Last 24 hours) at 12/30/2022 1513 Last data filed at 12/30/2022 0700 Gross per 24 hour  Intake 742.73 ml  Output 250 ml  Net 492.73 ml   Filed Weights   12/22/22 0019  Weight: 68.9 kg    Data Reviewed: I have personally reviewed and interpreted daily labs, tele strips, imagings as discussed above. I reviewed all nursing notes, pharmacy notes, vitals, pertinent old records I have discussed plan of care as described above with RN and patient/family.  CBC: Recent Labs  Lab 12/26/22 0639 12/27/22 0436 12/28/22 0526 12/29/22 0308 12/30/22 0502  WBC 14.0* 15.4* 17.6* 15.3* 12.9*  HGB 8.9* 8.8* 8.8* 7.9* 8.1*  HCT 27.4* 27.2* 27.1* 23.6* 25.0*  MCV 89.5 89.8 90.0 88.4 89.3  PLT 162 176 202 220 269   Basic Metabolic Panel: Recent Labs  Lab 12/24/22 0420 12/25/22 0500 12/26/22 0639 12/27/22 0436 12/28/22 0526 12/29/22 0308 12/30/22 0502  NA 138 139 137 137 134* 135 135  K 3.7 3.5 3.6 3.7 4.0 3.8 4.0  CL 109 111 109 108 108 108 106  CO2 21* 17* 21* 22 17* 21* 20*  GLUCOSE 157* 92 120* 98 106* 120* 115*  BUN 48* 43* 46* 54* 60* 70* 75*  CREATININE 2.60* 2.52* 2.67* 2.92* 3.22* 3.69* 3.97*  CALCIUM 8.3* 7.8* 7.9* 8.0* 7.7* 8.0* 8.3*  MG 2.1 1.9 2.0  --   --  1.9 2.0  PHOS 3.3 2.8 2.4*  --   --   --  4.9*    Studies: CT HEAD WO CONTRAST ( )  Result Date: 12/29/2022 CLINICAL DATA:  Atrial fibrillation, hypertension, neurologic deficit EXAM: CT HEAD WITHOUT CONTRAST TECHNIQUE: Contiguous axial images were obtained from the base of the skull through the vertex without intravenous contrast. RADIATION DOSE REDUCTION: This exam was performed according to the departmental dose-optimization program which includes  automated exposure control, adjustment of the mA and/or kV according to patient size and/or use of iterative reconstruction technique. COMPARISON:  01/30/2021 FINDINGS: Brain: Stable chronic small-vessel ischemic changes within the periventricular white matter. No evidence of acute infarct or hemorrhage. Stable cerebral atrophy, with ex vacuo dilatation of the lateral ventricles. Remaining midline structures are unremarkable. No acute extra-axial fluid collections. No mass effect. Vascular: Stable atherosclerosis.  No hyperdense vessel. Skull: Normal. Negative for fracture or focal lesion. Sinuses/Orbits: No acute finding. Other: None. IMPRESSION: 1. Stable head CT, no acute intracranial process. Electronically Signed   By: Sharlet Salina M.D.   On: 12/29/2022 15:48    Scheduled Meds:  sodium chloride   Intravenous Once   apixaban  2.5 mg Oral BID   vitamin C  500 mg Oral Daily   aspirin EC  81 mg Oral Daily   atorvastatin  20 mg Oral Daily   fluticasone furoate-vilanterol  1 puff Inhalation Daily  guaiFENesin  600 mg Oral BID   insulin aspart  0-9 Units Subcutaneous Q4H   iron polysaccharides  150 mg Oral Daily   midodrine  10 mg Oral TID WC   pantoprazole  40 mg Oral BID AC   sodium bicarbonate  650 mg Oral TID   traZODone  25 mg Oral QHS   Vitamin D (Ergocalciferol)  50,000 Units Oral Q7 days   Continuous Infusions:  sodium chloride 75 mL/hr at 12/30/22 1409   PRN Meds: acetaminophen **OR** acetaminophen, albuterol, chlorpheniramine-HYDROcodone, ipratropium-albuterol, ondansetron **OR** ondansetron (ZOFRAN) IV, mouth rinse, simethicone  Time spent: 50 minutes  Author: Val Riles. MD [facility] 12/30/2022 3:13 PM  To reach On-call, see care teams to locate the attending and reach out to them via [email or web address]. If 7PM-7AM, please contact night-coverage If you still have difficulty reaching the attending provider, please page the [facility] (Director on Call) for [facility]  on amion for assistance.

## 2022-12-30 NOTE — Progress Notes (Signed)
Physical Therapy Treatment Patient Details Name: Devin Washington MRN: [id number] DOB: Apr 11, 1923 Today's Date: 12/30/2022   History of Present Illness Pt is a 89 y/o M admitted on 12/21/22 after presenting with acute hypoxic repsiratory failure & placed on BiPAP. Work up revealed sepsis of unknown source. Pt also found to have DVT. PMH: a-fib, HTN, chronic renal sufficiency, anemia, SSS, permanent pacemaker, HLD, DM, aortic stenosis    PT Comments    Pt resting upright in bed slouched to the L side. Able to correct to neutral positioning easily. Agreeable to PT but reports he remains fatigued and dizzy. Requiring modA to sit EOB with placement of Ue's and feet to assist in sitting but unable to maintain without minA from PT. PT describes his dizziness as "spinning" but no notable nystagmus in eyes noted. Also symptoms do not subside after prolonged sitting for ~2-3 minutes. Pt trialed some sitting therex but ultimately reports need to lay down needing modA to assist and maxA+2 to scoot up in bed.  Pt remains significantly deconditioned below his baseline. Continue to rec STR at discharge.   Supine prior to PT: 114/ 45 mm Hg, 54 BPM  Supine post PT: 106/48 mm Hg, 63 BPM   Recommendations for follow up therapy are one component of a multi-disciplinary discharge planning process, led by the attending physician.  Recommendations may be updated based on patient status, additional functional criteria and insurance authorization.  Follow Up Recommendations  Skilled nursing-short term rehab (<3 hours/day) Can patient physically be transported by private vehicle: No   Assistance Recommended at Discharge Frequent or constant Supervision/Assistance  Patient can return home with the following Assist for transportation;Help with stairs or ramp for entrance;A little help with walking and/or transfers;Assistance with cooking/housework   Equipment Recommendations  None recommended by PT    Recommendations  for Other Services       Precautions / Restrictions Precautions Precautions: Fall Restrictions Weight Bearing Restrictions: No     Mobility  Bed Mobility Overal bed mobility: Needs Assistance Bed Mobility: Supine to Sit, Sit to Supine     Supine to sit: HOB elevated, Mod assist Sit to supine: Mod assist     Patient Response: Cooperative  Transfers                   General transfer comment: Unable to stand due to fatigue and weakness    Ambulation/Gait                   Stairs             Wheelchair Mobility    Modified Rankin (Stroke Patients Only)       Balance Overall balance assessment: Needs assistance Sitting-balance support: Bilateral upper extremity supported, Feet supported Sitting balance-Leahy Scale: Poor Sitting balance - Comments: L lateral lean with difficulty sitting upright despite LE and UE placement. Postural control: Left lateral lean                                  Cognition Arousal/Alertness: Lethargic Behavior During Therapy: WFL for tasks assessed/performed Overall Cognitive Status: Within Functional Limits for tasks assessed                                          Exercises General Exercises - Lower Extremity Long Arc  Quad: AROM, Strengthening, Both, 10 reps, Seated Hip Flexion/Marching: AROM, Seated, Strengthening, Both, 10 reps    General Comments        Pertinent Vitals/Pain Pain Assessment Pain Assessment: No/denies pain    Home Living                          Prior Function            PT Goals (current goals can now be found in the care plan section) Acute Rehab PT Goals Patient Stated Goal: get better, stronger PT Goal Formulation: With patient Time For Goal Achievement: 01/09/23 Potential to Achieve Goals: Fair Progress towards PT goals: Not progressing toward goals - comment    Frequency    Min 2X/week      PT Plan Current plan  remains appropriate    Co-evaluation              AM-PAC PT "6 Clicks" Mobility   Outcome Measure  Help needed turning from your back to your side while in a flat bed without using bedrails?: A Little Help needed moving from lying on your back to sitting on the side of a flat bed without using bedrails?: A Little Help needed moving to and from a bed to a chair (including a wheelchair)?: A Lot Help needed standing up from a chair using your arms (e.g., wheelchair or bedside chair)?: A Lot Help needed to walk in hospital room?: A Lot Help needed climbing 3-5 steps with a railing? : Total 6 Click Score: 13    End of Session   Activity Tolerance: Patient limited by lethargy Patient left: in bed;with call bell/phone within reach;with bed alarm set Nurse Communication: Mobility status PT Visit Diagnosis: Muscle weakness (generalized) (M62.81);Difficulty in walking, not elsewhere classified (R26.2);Unsteadiness on feet (R26.81)     Time: 2956-2130 PT Time Calculation (min) (ACUTE ONLY): 18 min  Charges:  $Therapeutic Activity: 8-22 mins                     Laddie Naeem M. Fairly IV, PT, DPT Physical Therapist- [place]  [facility]  12/30/2022, 3:56 PM

## 2022-12-31 DIAGNOSIS — R6520 Severe sepsis without septic shock: Secondary | ICD-10-CM | POA: Diagnosis not present

## 2022-12-31 DIAGNOSIS — A419 Sepsis, unspecified organism: Secondary | ICD-10-CM | POA: Diagnosis not present

## 2022-12-31 LAB — BASIC METABOLIC PANEL
Anion gap: 12 (ref 5–15)
BUN: 78 mg/dL — ABNORMAL HIGH (ref 8–23)
CO2: 15 mmol/L — ABNORMAL LOW (ref 22–32)
Calcium: 8.4 mg/dL — ABNORMAL LOW (ref 8.9–10.3)
Chloride: 108 mmol/L (ref 98–111)
Creatinine, Ser: 4.28 mg/dL — ABNORMAL HIGH (ref 0.61–1.24)
GFR, Estimated: 12 mL/min — ABNORMAL LOW (ref >60)
Glucose, Bld: 115 mg/dL — ABNORMAL HIGH (ref 70–99)
Potassium: 4.4 mmol/L (ref 3.5–5.1)
Sodium: 135 mmol/L (ref 135–145)

## 2022-12-31 LAB — CBC
HCT: 25.1 % — ABNORMAL LOW (ref 39.0–52.0)
Hemoglobin: 8.2 g/dL — ABNORMAL LOW (ref 13.0–17.0)
MCH: 29.4 pg (ref 26.0–34.0)
MCHC: 32.7 g/dL (ref 30.0–36.0)
MCV: 90 fL (ref 80.0–100.0)
Platelets: 304 10*3/uL (ref 150–400)
RBC: 2.79 MIL/uL — ABNORMAL LOW (ref 4.22–5.81)
RDW: 16.1 % — ABNORMAL HIGH (ref 11.5–15.5)
WBC: 16.3 10*3/uL — ABNORMAL HIGH (ref 4.0–10.5)
nRBC: 0 % (ref 0.0–0.2)

## 2022-12-31 LAB — BLOOD GAS, VENOUS
Acid-Base Excess: 3.3 mmol/L — ABNORMAL HIGH (ref 0.0–2.0)
Bicarbonate: 27.9 mmol/L (ref 20.0–28.0)
O2 Saturation: 54.9 %
Patient temperature: 37
pCO2, Ven: 42 mmHg — ABNORMAL LOW (ref 44–60)
pH, Ven: 7.43 (ref 7.25–7.43)
pO2, Ven: 36 mmHg (ref 32–45)

## 2022-12-31 LAB — GLUCOSE, CAPILLARY
Glucose-Capillary: 115 mg/dL — ABNORMAL HIGH (ref 70–99)
Glucose-Capillary: 116 mg/dL — ABNORMAL HIGH (ref 70–99)
Glucose-Capillary: 118 mg/dL — ABNORMAL HIGH (ref 70–99)
Glucose-Capillary: 135 mg/dL — ABNORMAL HIGH (ref 70–99)
Glucose-Capillary: 155 mg/dL — ABNORMAL HIGH (ref 70–99)
Glucose-Capillary: 68 mg/dL — ABNORMAL LOW (ref 70–99)
Glucose-Capillary: 83 mg/dL (ref 70–99)

## 2022-12-31 LAB — PHOSPHORUS: Phosphorus: 5.2 mg/dL — ABNORMAL HIGH (ref 2.5–4.6)

## 2022-12-31 LAB — MAGNESIUM: Magnesium: 2 mg/dL (ref 1.7–2.4)

## 2022-12-31 MED ORDER — NOREPINEPHRINE 4 MG/250ML-% IV SOLN
2.0000 ug/min | INTRAVENOUS | Status: DC
  Filled 2022-12-31: qty 250

## 2022-12-31 MED ORDER — SODIUM CHLORIDE 0.45 % IV SOLN
INTRAVENOUS | Status: DC
  Filled 2022-12-31 (×3): qty 75

## 2022-12-31 MED ORDER — FUROSEMIDE 10 MG/ML IJ SOLN
20.0000 mg | Freq: Once | INTRAMUSCULAR | Status: AC
  Administered 2022-12-31: 20 mg via INTRAVENOUS

## 2022-12-31 MED ORDER — FUROSEMIDE 10 MG/ML IJ SOLN
20.0000 mg | Freq: Once | INTRAMUSCULAR | Status: AC
  Administered 2022-12-31: 20 mg via INTRAVENOUS
  Filled 2022-12-31: qty 2

## 2022-12-31 MED ORDER — DROXIDOPA 100 MG PO CAPS
100.0000 mg | ORAL_CAPSULE | Freq: Three times a day (TID) | ORAL | Status: DC
  Administered 2022-12-31 – 2023-01-01 (×2): 100 mg via ORAL
  Filled 2022-12-31 (×3): qty 1

## 2022-12-31 MED ORDER — FUROSEMIDE 10 MG/ML IJ SOLN
INTRAMUSCULAR | Status: AC
  Filled 2022-12-31: qty 2

## 2022-12-31 MED ORDER — SODIUM CHLORIDE 0.9 % IV SOLN: 250.0000 mL | INTRAVENOUS | Status: DC

## 2022-12-31 MED ORDER — SODIUM BICARBONATE 8.4 % IV SOLN
50.0000 meq | Freq: Once | INTRAVENOUS | Status: AC
  Administered 2022-12-31: 50 meq via INTRAVENOUS
  Filled 2022-12-31: qty 50

## 2022-12-31 MED ORDER — MORPHINE SULFATE (PF) 2 MG/ML IV SOLN
1.0000 mg | Freq: Once | INTRAVENOUS | Status: AC
  Administered 2022-12-31: 1 mg via INTRAVENOUS
  Filled 2022-12-31: qty 1

## 2022-12-31 MED ORDER — IPRATROPIUM-ALBUTEROL 0.5-2.5 (3) MG/3ML IN SOLN
3.0000 mL | Freq: Once | RESPIRATORY_TRACT | Status: DC
  Filled 2022-12-31: qty 3

## 2022-12-31 MED ORDER — CHLORHEXIDINE GLUCONATE CLOTH 2 % EX PADS
6.0000 | MEDICATED_PAD | Freq: Every day | CUTANEOUS | Status: DC
  Administered 2023-01-01: 6 via TOPICAL

## 2022-12-31 NOTE — Plan of Care (Signed)

## 2022-12-31 NOTE — Progress Notes (Signed)
[facility]    SUBJECTIVE: Resting comfortably in bed denies any significant symptoms slightly lethargic but denies any shortness of breath or chest pain no palpitations or tachycardia   Vitals:   12/30/22 2027 12/30/22 2317 12/31/22 0440 12/31/22 0746  BP: (!) 114/49 (!) 129/49 (!) 109/50 (!) 104/55  Pulse: 76 70 67 62  Resp: '20 18 20 18  '$ Temp: 97.6 F (36.4 C) 97.6 F (36.4 C) 97.7 F (36.5 C)   TempSrc:      SpO2: 100% 100% 100% 100%  Weight:      Height:         Intake/Output Summary (Last 24 hours) at 12/31/2022 0755 Last data filed at 12/31/2022 0146 Gross per 24 hour  Intake 1158.71 ml  Output 130 ml  Net 1028.71 ml      PHYSICAL EXAM  General: Well developed, well nourished, in no acute distress HEENT:  Normocephalic and atramatic Neck:  No JVD.  Lungs: Clear bilaterally to auscultation and percussion. Heart: HRRR . Normal S1 and S2 without gallops or murmurs.  Abdomen: Bowel sounds are positive, abdomen soft and non-tender  Msk:  Back normal, normal gait. Normal strength and tone for age. Extremities: No clubbing, cyanosis or edema.   Neuro: Alert and oriented X 3. Psych:  Good affect, responds appropriately   LABS: Basic Metabolic Panel: Recent Labs    12/30/22 0502 12/31/22 0502  NA 135 135  K 4.0 4.4  CL 106 108  CO2 20* 15*  GLUCOSE 115* 115*  BUN 75* 78*  CREATININE 3.97* 4.28*  CALCIUM 8.3* 8.4*  MG 2.0 2.0  PHOS 4.9* 5.2*   Liver Function Tests: No results for input(s): "AST", "ALT", "ALKPHOS", "BILITOT", "PROT", "ALBUMIN" in the last 72 hours. No results for input(s): "LIPASE", "AMYLASE" in the last 72 hours. CBC: Recent Labs    12/30/22 0502 12/31/22 0502  WBC 12.9* 16.3*  HGB 8.1* 8.2*  HCT 25.0* 25.1*  MCV 89.3 90.0  PLT 269 304   Cardiac Enzymes: No results for input(s): "CKTOTAL", "CKMB", "CKMBINDEX", "TROPONINI" in the last 72 hours. BNP: Invalid input(s): "POCBNP" D-Dimer: No results for input(s): "DDIMER" in  the last 72 hours. Hemoglobin A1C: No results for input(s): "HGBA1C" in the last 72 hours. Fasting Lipid Panel: No results for input(s): "CHOL", "HDL", "LDLCALC", "TRIG", "CHOLHDL", "LDLDIRECT" in the last 72 hours. Thyroid Function Tests: No results for input(s): "TSH", "T4TOTAL", "T3FREE", "THYROIDAB" in the last 72 hours.  Invalid input(s): "FREET3" Anemia Panel: No results for input(s): "VITAMINB12", "FOLATE", "FERRITIN", "TIBC", "IRON", "RETICCTPCT" in the last 72 hours.  CT HEAD WO CONTRAST (5MM)  Result Date: 12/29/2022 CLINICAL DATA:  Atrial fibrillation, hypertension, neurologic deficit EXAM: CT HEAD WITHOUT CONTRAST TECHNIQUE: Contiguous axial images were obtained from the base of the skull through the vertex without intravenous contrast. RADIATION DOSE REDUCTION: This exam was performed according to the departmental dose-optimization program which includes automated exposure control, adjustment of the mA and/or kV according to patient size and/or use of iterative reconstruction technique. COMPARISON:  01/30/2021 FINDINGS: Brain: Stable chronic small-vessel ischemic changes within the periventricular white matter. No evidence of acute infarct or hemorrhage. Stable cerebral atrophy, with ex vacuo dilatation of the lateral ventricles. Remaining midline structures are unremarkable. No acute extra-axial fluid collections. No mass effect. Vascular: Stable atherosclerosis.  No hyperdense vessel. Skull: Normal. Negative for fracture or focal lesion. Sinuses/Orbits: No acute finding. Other: None. IMPRESSION: 1. Stable head CT, no acute intracranial process. Electronically Signed   By: Randa Ngo  M.D.   On: 12/29/2022 15:48   DG Chest Port 1 View  Result Date: 12/29/2022 CLINICAL DATA:  Pleural effusion EXAM: PORTABLE CHEST 1 VIEW COMPARISON:  12/26/2022, CT 12/21/2022 FINDINGS: Emphysema. Left-sided pacing device as before. Cardiomegaly with aortic atherosclerosis. Nodular opacity in the left  mid lung. Possible small left effusion. Increased airspace disease at left lung base. IMPRESSION: 1. Increased airspace disease at the left base may be due to atelectasis or pneumonia. Possible small left effusion. 2. Nodular opacity in the left mid lung, this appears to correspond to the possible nodule on chest CT, reference report 12/21/2022 for additional recommendations. 3. Cardiomegaly. 4. Emphysema. Electronically Signed   By: Donavan Foil M.D.   On: 12/29/2022 15:24     Echo mildly to moderately depressed left ventricular function  TELEMETRY: Paced rhythm 65:  ASSESSMENT AND PLAN:  Principal Problem:   Severe sepsis with acute organ dysfunction ([place]) Active Problems:   Acute on chronic diastolic (congestive) heart failure (HCC)   Presence of cardiac pacemaker   Sick sinus syndrome (HCC)   Chronic kidney disease, stage 3b (HCC)   Paroxysmal atrial fibrillation (HCC)   Iron deficiency anemia, unspecified   Hyperlipidemia Renal insufficiency stage IV  Plan History of sepsis improving plan antibiotic and fluids patient denies any symptoms resting comfortably in bed Acute on chronic systolic diastolic congestive heart failure reasonably compensated now no significant leg edema no shortness of breath Atrial fibrillation paroxysmal poor anticoagulant candidate because of previous GI bleeding continue rate control Sick sinus syndrome permanent pacemaker in place rate controlled Hyperlipidemia chronic stable continue statin therapy for lipid management Acute on chronic renal insufficiency consider mild hydration and nephrology input Continue conservative cardiac involvement   Yolonda Kida, MD,  12/31/2022 7:55 AM

## 2022-12-31 NOTE — Plan of Care (Signed)
  Problem: Clinical Measurements: Goal: Signs and symptoms of infection will decrease Outcome: Progressing   Problem: Metabolic: Goal: Ability to maintain appropriate glucose levels will improve Outcome: Progressing   Problem: Clinical Measurements: Goal: Will remain free from infection Outcome: Progressing   Problem: Clinical Measurements: Goal: Cardiovascular complication will be avoided Outcome: Progressing   Problem: Pain Managment: Goal: General experience of comfort will improve Outcome: Progressing   Problem: Safety: Goal: Ability to remain free from injury will improve Outcome: Progressing

## 2022-12-31 NOTE — Progress Notes (Signed)
[facility] Progress Note  Patient: Devin Washington    FXT:[id number]  DOA: 12/21/2022     Date of Service: the patient was seen and examined on 12/31/2022  Chief Complaint  Patient presents with   Respiratory Distress   Brief hospital course:  Devin Washington is a 89 y.o. male with medical history significant of atrial fibrillation, essential hypertension, chronic kidney disease stage IIIb, iron deficiency anemia, sick sinus syndrome status post pacemaker placement, hyperlipidemia, essential hypertension, chronic diastolic heart failure who was initially brought into the ER as a Devin Washington.  Patient was obtained there was significant hypoxic respiratory failure.  He is currently unable to give any history and is on BiPAP.  Patient arrived with oxygen sat of 62% on room air.  He was in respiratory distress on CPAP from EMS.  Workup so far showed severe sepsis with lactic acid of more than 9 and pH of 7.18.  Vitals are however stable.  Patient has chest x-ray that is negative.  Acute viral screen also negative.  No obvious source of sepsis.  At this point he is being admitted with acute hypoxic respiratory failure as well as severe sepsis of unknown source.  Workup is ongoing to determine the source.    Assessment and Plan:  # AKI on Chronic kidney disease stage III: Hydrate.  Monitor renal function Creatinine 3.75--3.2--2.6--2.5---3.69 --3.97 Monitor renal foods daily, monitor urine output Avoid nephrotoxic medications, use renal dose medications 1/14 US renal:  No evidence of obstructive uropathy. Mildly increased renal echogenicity as can be seen in the setting of medical renal disease. Bilateral pleural effusions. 1/15 again renal functions are deteriorating most likely due to hypotension  Bladder scan negative for any urinary retention,  1/17 AKI most likely secondary to prerenal due to hypotension, Nephrology consulted, recommended pressure support.  Started Levophed IV infusion   #  Metabolic acidosis due to AKI CO2 81--15--21--17 --20 S/p Sodium bicarbonate IV push given,  and s/p sodium bicarbonate IV infusion till 1/12, transition to oral bicarbonate 1/17 started bicarbonate IV infusion due to metabolic acidosis.   Monitor BMP daily   # Severe sepsis with endorgan damage: Patient presented with severe hypoxic respiratory failure, although no evidence of pneumonia on chest x-ray.   CT C/a/p: Peripheral reticulations with bibasilar subpleural cystic changes suggestive of interstitial lung disease/pulmonary fibrosis. Urinalysis was not very impressive, urine culture multiple species, contamination Continue cefepime  (d/c'd Vanc & Flagyl) S/p vancomycin DC'd on 1/9, MRSA PCR negative, blood culture negative, WBC improving.  Procalcitonin 22 elevated at admission 1/12 CXR: Stable cardiac enlargement and chronic lung disease. No acute findings. 1/12 discontinued Flagyl, started empiric azithromycin 500 mg p.o. daily for 5 days Trend WBC count 1/15 CXR: Increased airspace disease at the left base may be due to atelectasis or pneumonia. Possible small left effusion.   # Hypertension and paroxysmal atrial fibrillation:  Rate was controlled on Home regimen. 1/16 patient was hypotensive, discontinued amlodipine and metoprolol home meds. s/p midodrine 10 mg p.o. TID. 1/17 started Levophed IV infusion for pressure support as per nephrology recommendation patient developed AKI due to low blood pressure Monitor BP and titrate medications accordingly   Right lower extremity DVT Duplex shows DVT in the posterior tibial vein Vascular surgery consulted, recommended Eliquis 2.5 milligram p.o. 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  Recommend Eliquis 2.5 mg twice daily for 3 months. got opinion via secure chat, no official consult done.  Follow-up outpatient    # Acute hypoxic respiratory failure  with COPD exacerbation S/p BiPAP.  Continue antibiotics treatment and titrate of BiPAP S/p IV Solu-Medrol, discontinued steroids on 1/9 due to low hemoglobin, possible GI bleed.  Patient's respiratory failure resolved, currently saturating well on room air. Started Breo Ellipta inhaler, DuoNeb every 6 hourly scheduled followed by as needed Started Mucinex extremity gram p.o. twice daily and Tussionex for cough as needed   # Elevated troponin, type II MI most likely due to demand ischemia Troponin 189----476  continue to trend Continue to monitor on telemetry TTE shows LVEF 40 to 38%, grade 1 diastolic dysfunction, LV demonstrate regional wall motion abnormality EKG shows paced rhythm Patient remained chest pain-free Cardiology consulted, recommended no ischemic cardiac workup at this stage Started aspirin    # Anemia due to iron deficiency, monitor H&H Iron level 19, transferrin saturation 18% Folic acid 9.5 WNL Start oral iron supplement when stable, avoid IV iron due to active infection 1/9, Hb 6.4, transfuse 1 unit of PRBC 1/15 Hb 7.9 gradually dropping S/p Pantoprazole 40 mg IV BID, switch to p.o. Check FOBT   # Hypomagnesemia, mag repleted.  Resolved Monitor and replete as needed.   # severe lactic acidosis: Aggressively hydrate.  Continue antibiotics Lactic acidosis resolved, lactic acid 1.6 within normal range now. # sick sinus syndrome: Status post pacemaker placement.  Rate is now controlled.    # acute metabolic encephalopathy: Secondary to severe sepsis.  Resolved, patient is AOx3   # Pulmonary nodule, incidental finding on CT scan CT chest: Lingular subpleural 1.3 x 1 cm nodule. Consider one of the following in 3 months for both low-risk and high-risk individuals: (a) repeat chest CT, (b) follow-up PET-CT, or (c) tissue sampling. Patient was recommended to follow with PCP and pulmonary as an outpatient  PAD, abdominal aortic aneurysm CT A/P: Stable aneurysmal  bilateral common iliac arteries. 6. Stable aneurysmal infrarenal abdominal aorta (3.4 cm). Recommend annual imaging followup by CTA or MRA.  Patient was recommended to follow with PCP for incidental findings as above  Vitamin D insufficiency: started vitamin D 50,000 units p.o. weekly, follow with PCP to repeat vitamin D level after 3 to 6 months.  Body mass index is 21.81 kg/m.  Interventions:    PT and OT eval done, recommend SNF placement     Diet: Heart healthy diet DVT Prophylaxis: Subcutaneous Heparin    Advance goals of care discussion: DNR  Family Communication: family was not present at bedside, at the time of interview.  The pt provided permission to discuss medical plan with the family. Opportunity was given to ask question and all questions were answered satisfactorily.  1/15 called patient's granddaughter and left VM 1/16 discussed with patient's granddaughter over the phone, all question and concerns answered.  Disposition:  Pt is from Home, admitted with sepsis, developed AKI, metabolic acidosis, and hypotension, still creatinine is elevated, started bicarb IV infusion and Levophed IV infusion, which precludes a safe discharge. Discharge to SNF, when clinically stable, may need few more days to improve.  Subjective:  No significant overnight events, patient states that he had a little bit of rough time with breathing but overall he feels better, patient has dementia, not a very good historian to describe what is happening.  Patient seems awake and alert.  Mild shortness of breath, no any other active issues.  Physical Exam: General: lying comfortably, sleepy, mild  SOB, acidotic breathing Appear in no distress, affect appropriate Eyes: PERRLA  ENT: Oral Mucosa Clear, moist  Neck: no JVD,  Cardiovascular: S1 and S2 Present, no Murmur,  Respiratory: good respiratory effort, Bilateral Air entry equal and Decreased, mild Crackles and no significant wheezing  appreciated Abdomen: Bowel Sound present, Soft and no tenderness,  Skin: no rashes Extremities: 2-3+ edema b/l LE, Left calf thrombosed chronic aneurysm Neurologic: without any new focal findings Gait not checked due to patient safety concerns  Vitals:   12/30/22 2317 12/31/22 0440 12/31/22 0746 12/31/22 1140  BP: (!) 129/49 (!) 109/50 (!) 104/55 (!) 112/57  Pulse: 70 67 62 64  Resp: '18 20 18 20  '$ Temp: 97.6 F (36.4 C) 97.7 F (36.5 C)    TempSrc:      SpO2: 100% 100% 100% 98%  Weight:      Height:        Intake/Output Summary (Last 24 hours) at 12/31/2022 1416 Last data filed at 12/31/2022 0146 Gross per 24 hour  Intake 1058.71 ml  Output 130 ml  Net 928.71 ml   Filed Weights   12/22/22 0019  Weight: 68.9 kg    Data Reviewed: I have personally reviewed and interpreted daily labs, tele strips, imagings as discussed above. I reviewed all nursing notes, pharmacy notes, vitals, pertinent old records I have discussed plan of care as described above with RN and patient/family.  CBC: Recent Labs  Lab 12/27/22 0436 12/28/22 0526 12/29/22 0308 12/30/22 0502 12/31/22 0502  WBC 15.4* 17.6* 15.3* 12.9* 16.3*  HGB 8.8* 8.8* 7.9* 8.1* 8.2*  HCT 27.2* 27.1* 23.6* 25.0* 25.1*  MCV 89.8 90.0 88.4 89.3 90.0  PLT 176 202 220 269 469   Basic Metabolic Panel: Recent Labs  Lab 12/25/22 0500 12/26/22 0639 12/27/22 0436 12/28/22 0526 12/29/22 0308 12/30/22 0502 12/31/22 0502  NA 139 137 137 134* 135 135 135  K 3.5 3.6 3.7 4.0 3.8 4.0 4.4  CL 111 109 108 108 108 106 108  CO2 17* 21* 22 17* 21* 20* 15*  GLUCOSE 92 120* 98 106* 120* 115* 115*  BUN 43* 46* 54* 60* 70* 75* 78*  CREATININE 2.52* 2.67* 2.92* 3.22* 3.69* 3.97* 4.28*  CALCIUM 7.8* 7.9* 8.0* 7.7* 8.0* 8.3* 8.4*  MG 1.9 2.0  --   --  1.9 2.0 2.0  PHOS 2.8 2.4*  --   --   --  4.9* 5.2*    Studies: No results found.  Scheduled Meds:  sodium chloride   Intravenous Once   apixaban  2.5 mg Oral BID   vitamin C   500 mg Oral Daily   aspirin EC  81 mg Oral Daily   atorvastatin  20 mg Oral Daily   fluticasone furoate-vilanterol  1 puff Inhalation Daily   guaiFENesin  600 mg Oral BID   insulin aspart  0-9 Units Subcutaneous Q4H   iron polysaccharides  150 mg Oral Daily   pantoprazole  40 mg Oral BID AC   sodium bicarbonate  650 mg Oral TID   traZODone  25 mg Oral QHS   Vitamin D (Ergocalciferol)  50,000 Units Oral Q7 days   Continuous Infusions:  sodium chloride     norepinephrine (LEVOPHED) Adult infusion     sodium bicarbonate 75 mEq in sodium chloride 0.45 % 1,075 mL infusion 75 mL/hr at 12/31/22 1044   PRN Meds: acetaminophen **OR** acetaminophen, albuterol, chlorpheniramine-HYDROcodone, ipratropium-albuterol, ondansetron **OR** ondansetron (ZOFRAN) IV, mouth rinse, simethicone  Time spent: 50 minutes  Author: Val Riles. MD [facility] 12/31/2022 2:16 PM  To reach  On-call, see care teams to locate the attending and reach out to them via [email or web address]. If 7PM-7AM, please contact night-coverage If you still have difficulty reaching the attending provider, please page the [facility] (Director on Call) for [facility] on amion for assistance.

## 2022-12-31 NOTE — Progress Notes (Addendum)
Pt still wheezing even after 2 PRN breathing treatment. Pt have clear diminshed lung sound with expiratory wheezing Pt sating at100 % RA.Pt have normal saline running at 75 ml//hr but notice swelling on Bilateral upper arms and right lower extremity *2 and * 3 on left leg. Pt bladder scan showed 158 only voids 25 ml on pt canister. MD Mansy made aware.  Update 0045. MD Mansy placed order. Ordered to hold fluids for 4 hours.. Will continue to monitor.  Update 0114: MD Mansy ordered to discontinue douneb once. Douneb was previously adminstered at 0030/ Will continue to monitor,.  Update 0148: Pt voided 105 ml and is resting now. Will continue to monitor.

## 2022-12-31 NOTE — Progress Notes (Signed)
PT Cancellation Note  Patient Details Name: Devin Washington MRN: [id number] DOB: 1923-08-19   Cancelled Treatment:    Reason Eval/Treat Not Completed: Other (comment). Upon entry to room multiple providers at bedside. PT to hold this date and will monitor medical chart. Will re-attempt as able and medically appropriate.    Salem Caster. Fairly IV, PT, DPT Physical Therapist- [facility]  12/31/2022, 2:48 PM

## 2022-12-31 NOTE — Progress Notes (Signed)
An USGPIV (ultrasound guided PIV) 20G x 1.25 " on LFA has been placed for short-term vasopressor infusion. A correctly placed ivWatch must be used when administering Vasopressors. Should this treatment be needed beyond 72 hours, central line access should be obtained.  It will be the responsibility of the bedside nurse to follow best practice to prevent extravasations.

## 2022-12-31 NOTE — Plan of Care (Signed)
  Problem: Respiratory: Goal: Ability to maintain adequate ventilation will improve Outcome: Not Progressing   Problem: Fluid Volume: Goal: Ability to maintain a balanced intake and output will improve Outcome: Not Progressing

## 2022-12-31 NOTE — Consult Note (Signed)
[facility]  CONSULT NOTE    Date: 12/31/2022                  Patient Name:  Devin Washington  MRN: [id number]  DOB: 06-30-1923  Age / Sex: 89 y.o., male         PCP: Pcp, No                 Service Requesting Consult: [place]                 Reason for Consult: Acute kidney injury            History of Present Illness: [name] is a 89 y.o.  male with past medical conditions of a fib, anemia, sick sinus syndrome, chronic diastolic heart failure, hyperlipidemia, hypertension, and chronic kidney disease, who was admitted to [facility] on 12/21/2022 for Acute respiratory failure with hypoxia ([place]) [J96.01] AKI (acute kidney injury) ([place]) [N17.9] Septic shock ([place]) [A41.9, R65.21] Severe sepsis with acute organ dysfunction ([place]) [A41.9, R65.20]  Patient presents to the ED in respiratory distress requiring Cpap during EMS transport.  Patient seen sitting up in chair, currently resting quietly.  Alert and oriented.  Currently on room air.  Lower extremity edema present.  History of chronic kidney disease however stage unknown.  Patient states he was brought to emergency department by his son, unaware of reason.  Labs on initial ED arrival include sodium 134, serum bicarb 10, glucose 196, BUN 62, creatinine 3.75 with GFR 14, albumin 2.8, troponin 182, and GFR 8.9.  Negative for influenza, COVID-19, and RSV.  CT abdomen pelvis shows some simple renal cyst with no hydronephrosis.  Renal ultrasound confirms these findings.  Medications: Outpatient medications: Medications Prior to Admission  Medication Sig Dispense Refill Last Dose   amLODipine (NORVASC) 10 MG tablet Take 10 mg by mouth in the morning.   12/21/2022 at am   aspirin EC (ASPIR-LOW) 81 MG tablet 81 mg daily.   12/21/2022 at am   benazepril (LOTENSIN) 20 MG tablet Take 20 mg by mouth in the morning.   12/21/2022 at am   cyanocobalamin 1000 MCG tablet Take 1,000 mcg by mouth daily.   12/21/2022 at am   ferrous  sulfate 325 (65 FE) MG EC tablet Take 325 mg by mouth 2 (two) times daily.   12/21/2022 at am   furosemide (LASIX) 40 MG tablet Take 40 mg by mouth in the morning.   12/21/2022 at am   metoprolol tartrate (LOPRESSOR) 25 MG tablet Take 25 mg by mouth 2 (two) times daily.   12/21/2022 at am   pantoprazole (PROTONIX) 40 MG tablet Take 40 mg by mouth 2 (two) times daily.   12/21/2022 at am   simvastatin (ZOCOR) 40 MG tablet Take 40 mg by mouth at bedtime.   12/20/2022 at hs   traMADol (ULTRAM) 50 MG tablet Take 50 mg by mouth 3 (three) times daily as needed.   Past Week at unknown   traZODone (DESYREL) 50 MG tablet Take 50 mg by mouth at bedtime.   12/20/2022 at hs    Current medications: Current Facility-Administered Medications  Medication Dose Route Frequency Provider Last Rate Last Admin   0.9 %  sodium chloride infusion (Manually program via Guardrails IV Fluids)   Intravenous Once Val Riles, MD       acetaminophen (TYLENOL) tablet 650 mg  650 mg Oral Q6H PRN Elwyn Reach, MD  Or   acetaminophen (TYLENOL) suppository 650 mg  650 mg Rectal Q6H PRN Elwyn Reach, MD       albuterol (PROVENTIL) (2.5 MG/3ML) 0.083% nebulizer solution 2.5 mg  2.5 mg Nebulization Q6H PRN Val Riles, MD   2.5 mg at 12/30/22 2126   apixaban (ELIQUIS) tablet 2.5 mg  2.5 mg Oral BID Val Riles, MD   2.5 mg at 12/31/22 1000   ascorbic acid (VITAMIN C) tablet 500 mg  500 mg Oral Daily Val Riles, MD   500 mg at 12/31/22 1000   aspirin EC tablet 81 mg  81 mg Oral Daily Val Riles, MD   81 mg at 12/31/22 1000   atorvastatin (LIPITOR) tablet 20 mg  20 mg Oral Daily Val Riles, MD   20 mg at 12/30/22 2120   chlorpheniramine-HYDROcodone (TUSSIONEX) 10-8 MG/5ML suspension 5 mL  5 mL Oral Q12H PRN Val Riles, MD       fluticasone furoate-vilanterol (BREO ELLIPTA) 200-25 MCG/ACT 1 puff  1 puff Inhalation Daily Val Riles, MD   1 puff at 12/31/22 1009   guaiFENesin (MUCINEX) 12 hr tablet 600 mg  600 mg  Oral BID Val Riles, MD   600 mg at 12/31/22 1000   insulin aspart (novoLOG) injection 0-9 Units  0-9 Units Subcutaneous Q4H Val Riles, MD   1 Units at 12/29/22 2124   ipratropium-albuterol (DUONEB) 0.5-2.5 (3) MG/3ML nebulizer solution 3 mL  3 mL Nebulization Q4H PRN Val Riles, MD   3 mL at 12/31/22 0030   iron polysaccharides (NIFEREX) capsule 150 mg  150 mg Oral Daily Val Riles, MD   150 mg at 12/31/22 1000   midodrine (PROAMATINE) tablet 10 mg  10 mg Oral TID WC Val Riles, MD   10 mg at 12/31/22 1000   ondansetron (ZOFRAN) tablet 4 mg  4 mg Oral Q6H PRN Elwyn Reach, MD       Or   ondansetron (ZOFRAN) injection 4 mg  4 mg Intravenous Q6H PRN Elwyn Reach, MD       Oral care mouth rinse  15 mL Mouth Rinse PRN Max Sane, MD       pantoprazole (PROTONIX) EC tablet 40 mg  40 mg Oral BID AC Shah, Vipul, MD   40 mg at 12/31/22 1000   simethicone (MYLICON) chewable tablet 80 mg  80 mg Oral Q6H PRN Val Riles, MD       sodium bicarbonate 75 mEq in sodium chloride 0.45 % 1,075 mL infusion   Intravenous Continuous Val Riles, MD 75 mL/hr at 12/31/22 1044 New Bag at 12/31/22 1044   sodium bicarbonate tablet 650 mg  650 mg Oral TID Val Riles, MD   650 mg at 12/31/22 1000   traZODone (DESYREL) tablet 25 mg  25 mg Oral QHS Val Riles, MD   25 mg at 12/30/22 2120   Vitamin D (Ergocalciferol) (DRISDOL) 1.25 MG (50000 UNIT) capsule 50,000 Units  50,000 Units Oral Q7 days Val Riles, MD   50,000 Units at 12/30/22 1647      Allergies: No Known Allergies    Past Medical History: History reviewed. No pertinent past medical history.   Past Surgical History: History reviewed. No pertinent surgical history.   Family History: History reviewed. No pertinent family history.   Social History: Social History   Socioeconomic History   Marital status: Single    Spouse name: Not on file   Number of children: Not on file   Years of education: Not  on file    Highest education level: Not on file  Occupational History   Not on file  Tobacco Use   Smoking status: Former    Types: Cigarettes   Smokeless tobacco: Never  Substance and Sexual Activity   Alcohol use: Not Currently   Drug use: Never   Sexual activity: Not Currently  Other Topics Concern   Not on file  Social History Narrative   Not on file   Social Determinants of Health   Financial Resource Strain: Not on file  Food Insecurity: No Food Insecurity (12/22/2022)   Hunger Vital Sign    Worried About Running Out of Food in the Last Year: Never true    Ran Out of Food in the Last Year: Never true  Transportation Needs: No Transportation Needs (12/22/2022)   PRAPARE - Hydrologist (Medical): No    Lack of Transportation (Non-Medical): No  Physical Activity: Not on file  Stress: Not on file  Social Connections: Not on file  Intimate Partner Violence: Not At Risk (12/22/2022)   Humiliation, Afraid, Rape, and Kick questionnaire    Fear of Current or Ex-Partner: No    Emotionally Abused: No    Physically Abused: No    Sexually Abused: No     Review of Systems: Review of Systems  Constitutional:  Negative for chills, fever and malaise/fatigue.  HENT:  Negative for congestion, sore throat and tinnitus.   Eyes:  Negative for blurred vision and redness.  Respiratory:  Positive for shortness of breath. Negative for cough and wheezing.   Cardiovascular:  Negative for chest pain, palpitations, claudication and leg swelling.  Gastrointestinal:  Negative for abdominal pain, blood in stool, diarrhea, nausea and vomiting.  Genitourinary:  Negative for flank pain, frequency and hematuria.  Musculoskeletal:  Negative for back pain, falls and myalgias.  Skin:  Negative for rash.  Neurological:  Negative for dizziness, weakness and headaches.  Endo/Heme/Allergies:  Does not bruise/bleed easily.  Psychiatric/Behavioral:  Negative for depression. The patient is not  nervous/anxious and does not have insomnia.     Vital Signs: Blood pressure (!) 104/55, pulse 62, temperature 97.7 F (36.5 C), resp. rate 18, height '5\' 10"'$  (1.778 m), weight 68.9 kg, SpO2 100 %.  Weight trends: Filed Weights   12/22/22 0019  Weight: 68.9 kg    Physical Exam: General: NAD  Head: Normocephalic, atraumatic.   Eyes: Anicteric  Lungs:  Clear to auscultation, normal effort, room air  Heart: Regular rate and rhythm  Abdomen:  Soft, nontender  Extremities: 2+ peripheral edema.  Neurologic: Alert and oriented, moving all four extremities  Skin: No lesions  Access: None     Lab results: Basic Metabolic Panel: Recent Labs  Lab 12/25/22 0500 12/26/22 0639 12/27/22 0436 12/29/22 0308 12/30/22 0502 12/31/22 0502  NA 139 137   < > 135 135 135  K 3.5 3.6   < > 3.8 4.0 4.4  CL 111 109   < > 108 106 108  CO2 17* 21*   < > 21* 20* 15*  GLUCOSE 92 120*   < > 120* 115* 115*  BUN 43* 46*   < > 70* 75* 78*  CREATININE 2.52* 2.67*   < > 3.69* 3.97* 4.28*  CALCIUM 7.8* 7.9*   < > 8.0* 8.3* 8.4*  MG 1.9 2.0  --  1.9 2.0 2.0  PHOS 2.8 2.4*  --   --  4.9* 5.2*   < > = values in  this interval not displayed.    Liver Function Tests: No results for input(s): "AST", "ALT", "ALKPHOS", "BILITOT", "PROT", "ALBUMIN" in the last 168 hours. No results for input(s): "LIPASE", "AMYLASE" in the last 168 hours. No results for input(s): "AMMONIA" in the last 168 hours.  CBC: Recent Labs  Lab 12/27/22 0436 12/28/22 0526 12/29/22 0308 12/30/22 0502 12/31/22 0502  WBC 15.4* 17.6* 15.3* 12.9* 16.3*  HGB 8.8* 8.8* 7.9* 8.1* 8.2*  HCT 27.2* 27.1* 23.6* 25.0* 25.1*  MCV 89.8 90.0 88.4 89.3 90.0  PLT 176 202 220 269 304    Cardiac Enzymes: No results for input(s): "CKTOTAL", "CKMB", "CKMBINDEX", "TROPONINI" in the last 168 hours.  BNP: Invalid input(s): "POCBNP"  CBG: Recent Labs  Lab 12/30/22 1701 12/30/22 2112 12/30/22 2350 12/31/22 0435 12/31/22 0749  GLUCAP 96  101* 115* 118* 116*    Microbiology: Results for orders placed or performed during the hospital encounter of 12/21/22  Resp panel by RT-PCR (RSV, Flu A&B, Covid) Anterior Nasal Swab     Status: None   Collection Time: 12/21/22  6:54 PM   Specimen: Anterior Nasal Swab  Result Value Ref Range Status   SARS Coronavirus 2 by RT PCR NEGATIVE NEGATIVE Final    Comment: (NOTE) SARS-CoV-2 target nucleic acids are NOT DETECTED.  The SARS-CoV-2 RNA is generally detectable in upper respiratory specimens during the acute phase of infection. The lowest concentration of SARS-CoV-2 viral copies this assay can detect is 138 copies/mL. A negative result does not preclude SARS-Cov-2 infection and should not be used as the sole basis for treatment or other patient management decisions. A negative result may occur with  improper specimen collection/handling, submission of specimen other than nasopharyngeal swab, presence of viral mutation(s) within the areas targeted by this assay, and inadequate number of viral copies(<138 copies/mL). A negative result must be combined with clinical observations, patient history, and epidemiological information. The expected result is Negative.  Fact Sheet for Patients:  EntrepreneurPulse.com.au  Fact Sheet for Healthcare Providers:  IncredibleEmployment.be  This test is no t yet approved or cleared by the [place] FDA and  has been authorized for detection and/or diagnosis of SARS-CoV-2 by FDA under an Emergency Use Authorization (EUA). This EUA will remain  in effect (meaning this test can be used) for the duration of the COVID-19 declaration under Section 564(b)(1) of the Act, 21 U.S.C.section 360bbb-3(b)(1), unless the authorization is terminated  or revoked sooner.       Influenza A by PCR NEGATIVE NEGATIVE Final   Influenza B by PCR NEGATIVE NEGATIVE Final    Comment: (NOTE) The Xpert Xpress SARS-CoV-2/FLU/RSV  plus assay is intended as an aid in the diagnosis of influenza from Nasopharyngeal swab specimens and should not be used as a sole basis for treatment. Nasal washings and aspirates are unacceptable for Xpert Xpress SARS-CoV-2/FLU/RSV testing.  Fact Sheet for Patients: EntrepreneurPulse.com.au  Fact Sheet for Healthcare Providers: IncredibleEmployment.be  This test is not yet approved or cleared by the [place] FDA and has been authorized for detection and/or diagnosis of SARS-CoV-2 by FDA under an Emergency Use Authorization (EUA). This EUA will remain in effect (meaning this test can be used) for the duration of the COVID-19 declaration under Section 564(b)(1) of the Act, 21 U.S.C. section 360bbb-3(b)(1), unless the authorization is terminated or revoked.     Resp Syncytial Virus by PCR NEGATIVE NEGATIVE Final    Comment: (NOTE) Fact Sheet for Patients: EntrepreneurPulse.com.au  Fact Sheet for Healthcare Providers: IncredibleEmployment.be  This test  is not yet approved or cleared by the [place] and has been authorized for detection and/or diagnosis of SARS-CoV-2 by FDA under an Emergency Use Authorization (EUA). This EUA will remain in effect (meaning this test can be used) for the duration of the COVID-19 declaration under Section 564(b)(1) of the Act, 21 U.S.C. section 360bbb-3(b)(1), unless the authorization is terminated or revoked.  Performed at [facility], [address] [place], [place] [address]   Blood Culture (routine x 2)     Status: None   Collection Time: 12/21/22  6:54 PM   Specimen: BLOOD  Result Value Ref Range Status   Specimen Description BLOOD BLOOD RIGHT FOREARM  Final   Special Requests   Final    BOTTLES DRAWN AEROBIC AND ANAEROBIC Blood Culture adequate volume   Culture   Final    NO GROWTH 5 DAYS Performed at [facility], [address], [place], [place] [address]    Report Status 12/26/2022 FINAL  Final  Blood Culture (routine x 2)     Status: None   Collection Time: 12/21/22  6:59 PM   Specimen: BLOOD  Result Value Ref Range Status   Specimen Description BLOOD BLOOD LEFT FOREARM  Final   Special Requests   Final    BOTTLES DRAWN AEROBIC AND ANAEROBIC Blood Culture adequate volume   Culture   Final    NO GROWTH 5 DAYS Performed at [facility], [address], [place], [place] [address]    Report Status 12/26/2022 FINAL  Final  Urine Culture     Status: Abnormal   Collection Time: 12/22/22 12:12 AM   Specimen: In/Out Cath Urine  Result Value Ref Range Status   Specimen Description   Final    IN/OUT CATH URINE Performed at [facility], [address] [place], [place] [address]    Special Requests   Final    NONE Performed at [facility], [address] [place], [place] [address]    Culture MULTIPLE SPECIES PRESENT, SUGGEST RECOLLECTION (A)  Final   Report Status 12/23/2022 FINAL  Final  MRSA Next Gen by PCR, Nasal     Status: None   Collection Time: 12/22/22 10:30 PM   Specimen: Nasal Mucosa; Nasal Swab  Result Value Ref Range Status   MRSA by PCR Next Gen NOT DETECTED NOT DETECTED Final    Comment: (NOTE) The GeneXpert MRSA Assay (FDA approved for NASAL specimens only), is one component of a comprehensive MRSA colonization surveillance program. It is not intended to diagnose MRSA infection nor to guide or monitor treatment for MRSA infections. Test performance is not FDA approved in patients less than 70 years old. Performed at [facility], [address] [place], [place] [address]     Coagulation Studies: No results for input(s): "LABPROT", "INR" in the last 72 hours.  Urinalysis: No results for input(s): "COLORURINE", "LABSPEC", "PHURINE", "GLUCOSEU", "HGBUR", "BILIRUBINUR", "KETONESUR", "PROTEINUR", "UROBILINOGEN", "NITRITE", "LEUKOCYTESUR" in the  last 72 hours.  Invalid input(s): "APPERANCEUR"    Imaging: CT HEAD WO CONTRAST (5MM)  Result Date: 12/29/2022 CLINICAL DATA:  Atrial fibrillation, hypertension, neurologic deficit EXAM: CT HEAD WITHOUT CONTRAST TECHNIQUE: Contiguous axial images were obtained from the base of the skull through the vertex without intravenous contrast. RADIATION DOSE REDUCTION: This exam was performed according to the departmental dose-optimization program which includes automated exposure control, adjustment of the mA and/or kV according to patient size and/or use of iterative reconstruction technique. COMPARISON:  01/30/2021  FINDINGS: Brain: Stable chronic small-vessel ischemic changes within the periventricular white matter. No evidence of acute infarct or hemorrhage. Stable cerebral atrophy, with ex vacuo dilatation of the lateral ventricles. Remaining midline structures are unremarkable. No acute extra-axial fluid collections. No mass effect. Vascular: Stable atherosclerosis.  No hyperdense vessel. Skull: Normal. Negative for fracture or focal lesion. Sinuses/Orbits: No acute finding. Other: None. IMPRESSION: 1. Stable head CT, no acute intracranial process. Electronically Signed   By: Randa Ngo M.D.   On: 12/29/2022 15:48   DG Chest Port 1 View  Result Date: 12/29/2022 CLINICAL DATA:  Pleural effusion EXAM: PORTABLE CHEST 1 VIEW COMPARISON:  12/26/2022, CT 12/21/2022 FINDINGS: Emphysema. Left-sided pacing device as before. Cardiomegaly with aortic atherosclerosis. Nodular opacity in the left mid lung. Possible small left effusion. Increased airspace disease at left lung base. IMPRESSION: 1. Increased airspace disease at the left base may be due to atelectasis or pneumonia. Possible small left effusion. 2. Nodular opacity in the left mid lung, this appears to correspond to the possible nodule on chest CT, reference report 12/21/2022 for additional recommendations. 3. Cardiomegaly. 4. Emphysema. Electronically  Signed   By: Donavan Foil M.D.   On: 12/29/2022 15:24     Assessment & Plan: Mr. AARAV BURGETT is a 89 y.o.  male with past medical conditions of a fib, anemia, sick sinus syndrome, chronic diastolic heart failure, hyperlipidemia, hypertension, and chronic kidney disease, who was admitted to [facility] on 12/21/2022 for Acute respiratory failure with hypoxia ([place]) [J96.01] AKI (acute kidney injury) ([place]) [N17.9] Septic shock ([place]) [A41.9, R65.21] Severe sepsis with acute organ dysfunction ([place]) [A41.9, R65.20]  Acute kidney injury on chronic kidney disease stage unknown. Acute kidney injury likely secondary to hypotension and severe illness. Creatinine on admission 3.75 with initial improvement to 2.36. No IV contrast exposure during this admission. Creatinine has progressively increased and is now 4.28. Recommend more aggressive measures to increase blood pressure. Agree with IVF for now. Recommend palliative care consult for goals of care. No acute need for dialysis but if renal function continues to decline, may have to consider temporary. Based on cardiac history, patient would make a poor candidate for long term dialysis.   2. Anemia of chronic kidney disease  Lab Results  Component Value Date   HGB 8.2 (L) 12/31/2022    Hgb below desired target. Will continue to monitor.   3. Hypotension due to sepsis. All antihypertensives held. Receiving midodrine for BP support.  Recommend more aggressive blood pressure management as above.  4.  Acute metabolic acidosis.  Serum bicarb 10 on admission.  Patient currently receiving serum bicarb infusion.  LOS: 10   1/17/202411:21 AM

## 2022-12-31 NOTE — Progress Notes (Addendum)
Occupational Therapy Treatment Patient Details Name: Devin Washington MRN: [id number] DOB: [birth year] Today's Date: 12/31/2022   History of present illness Pt is a 89 y/o M admitted on 12/21/22 after presenting with acute hypoxic repsiratory failure & placed on BiPAP. Work up revealed sepsis of unknown source. Pt also found to have DVT. PMH: a-fib, HTN, chronic renal sufficiency, anemia, SSS, permanent pacemaker, HLD, DM, aortic stenosis   OT comments  Devin Washington was seen for OT treatment on this date. Upon arrival to room pt eating breakfast in bed, agreeable to tx. Pt requires MIN A exit bed, intermittent R lateral lean noted with fatigue. Significantly increased work of breath with activity however SpO2 100% on 2L [place]. MAX A for squat pivot t/f bed>chair. Pt making progress toward goals, will continue to follow POC. Discharge recommendation remains appropriate.     Recommendations for follow up therapy are one component of a multi-disciplinary discharge planning process, led by the attending physician.  Recommendations may be updated based on patient status, additional functional criteria and insurance authorization.    Follow Up Recommendations  Skilled nursing-short term rehab (<3 hours/day)     Assistance Recommended at Discharge Intermittent Supervision/Assistance  Patient can return home with the following  A lot of help with walking and/or transfers;A lot of help with bathing/dressing/bathroom   Equipment Recommendations  Other (comment)    Recommendations for Other Services      Precautions / Restrictions Precautions Precautions: Fall Restrictions Weight Bearing Restrictions: No       Mobility Bed Mobility Overal bed mobility: Needs Assistance Bed Mobility: Supine to Sit     Supine to sit: Min assist          Transfers Overall transfer level: Needs assistance Equipment used: 1 person hand held assist Transfers: Bed to chair/wheelchair/BSC     Squat pivot  transfers: Max assist             Balance Overall balance assessment: Needs assistance Sitting-balance support: Bilateral upper extremity supported, Feet supported Sitting balance-Leahy Scale: Fair Sitting balance - Comments: intermittent R lean   Standing balance support: Bilateral upper extremity supported, During functional activity, Reliant on assistive device for balance Standing balance-Leahy Scale: Poor                             ADL either performed or assessed with clinical judgement   ADL Overall ADL's : Needs assistance/impaired                                       General ADL Comments: SETUP self-feeding in chair. MAX A for simulated BSC t/f      Cognition Arousal/Alertness: Awake/alert Behavior During Therapy: WFL for tasks assessed/performed Overall Cognitive Status: Within Functional Limits for tasks assessed                                 General Comments: increased alertness upon arrival, quickly falls asleep once in the chair              General Comments SpO2 100% on 2L [place]    Pertinent Vitals/ Pain       Pain Assessment Pain Assessment: Faces Faces Pain Scale: Hurts little more Pain Location: L calf Pain Descriptors / Indicators: Discomfort, Dull Pain Intervention(s): Limited activity  within patient's tolerance, Repositioned   Frequency  Min 2X/week        Progress Toward Goals  OT Goals(current goals can now be found in the care plan section)  Progress towards OT goals: Progressing toward goals  Acute Rehab OT Goals Patient Stated Goal: to breathe better OT Goal Formulation: With patient Time For Goal Achievement: 01/09/23 Potential to Achieve Goals: Good ADL Goals Pt Will Perform Grooming: with modified independence;standing Pt Will Perform Lower Body Dressing: with modified independence;sit to/from stand Pt Will Transfer to Toilet: with modified independence;ambulating;regular  height toilet  Plan Discharge plan remains appropriate;Frequency remains appropriate    Co-evaluation                 AM-PAC OT "6 Clicks" Daily Activity     Outcome Measure   Help from another person eating meals?: None Help from another person taking care of personal grooming?: A Lot Help from another person toileting, which includes using toliet, bedpan, or urinal?: A Lot Help from another person bathing (including washing, rinsing, drying)?: A Lot Help from another person to put on and taking off regular upper body clothing?: A Little Help from another person to put on and taking off regular lower body clothing?: A Little 6 Click Score: 16    End of Session    OT Visit Diagnosis: Other abnormalities of gait and mobility (R26.89);Muscle weakness (generalized) (M62.81)   Activity Tolerance Patient tolerated treatment well   Patient Left in chair;with call bell/phone within reach;with chair alarm set   Nurse Communication Mobility status        Time: 2957-4734 OT Time Calculation (min): 19 min  Charges: OT General Charges $OT Visit: 1 Visit OT Treatments $Self Care/Home Management : 8-22 mins  Dessie Coma, M.S. OTR/L  12/31/22, 10:02 AM  ascom [phone]

## 2022-12-31 NOTE — Progress Notes (Signed)
Notified NP Randol Kern of current condition. Patient is dyspneic with bilateral wheezes at rest. Low urine output. Bladder scan was 36cc of fluid. Patient is receiving sodium bicarb at 75cc/hour. No new orders at this time.

## 2022-12-31 NOTE — TOC Progression Note (Addendum)
Transition of Care [facility]) - Progression Note    Patient Details  Name: Devin Washington MRN: [id number] Date of Birth: 06/21/1923  Transition of Care [facility]) CM/SW [place], [place] Phone Number: 12/31/2022, 9:51 AM  Clinical Narrative:     CSW spoke with patient's granddaughter Kenney Houseman, she was provided bed offers agreeable to [facility] [facility] [address] insurance Josem Kaufmann has been started by [facility].  Pending Certification Number: [id number]  Expected Discharge Plan: [place] Barriers to Discharge: Continued Medical Work up  Expected Discharge Plan and Services                                               Social Determinants of Health (SDOH) Interventions SDOH Screenings   Food Insecurity: No Food Insecurity (12/22/2022)  Housing: Low Risk  (12/22/2022)  Transportation Needs: No Transportation Needs (12/22/2022)  Utilities: Not At Risk (12/22/2022)  Tobacco Use: Medium Risk (12/22/2022)    Readmission Risk Interventions     No data to display

## 2023-01-01 DIAGNOSIS — A419 Sepsis, unspecified organism: Secondary | ICD-10-CM | POA: Diagnosis not present

## 2023-01-01 DIAGNOSIS — R6520 Severe sepsis without septic shock: Secondary | ICD-10-CM | POA: Diagnosis not present

## 2023-01-01 LAB — BASIC METABOLIC PANEL
Anion gap: 14 (ref 5–15)
Anion gap: 12 (ref 5–15)
BUN: 83 mg/dL — ABNORMAL HIGH (ref 8–23)
BUN: 83 mg/dL — ABNORMAL HIGH (ref 8–23)
CO2: 18 mmol/L — ABNORMAL LOW (ref 22–32)
CO2: 18 mmol/L — ABNORMAL LOW (ref 22–32)
Calcium: 8.3 mg/dL — ABNORMAL LOW (ref 8.9–10.3)
Calcium: 8.4 mg/dL — ABNORMAL LOW (ref 8.9–10.3)
Chloride: 105 mmol/L (ref 98–111)
Chloride: 104 mmol/L (ref 98–111)
Creatinine, Ser: 4.78 mg/dL — ABNORMAL HIGH (ref 0.61–1.24)
Creatinine, Ser: 4.95 mg/dL — ABNORMAL HIGH (ref 0.61–1.24)
GFR, Estimated: 10 mL/min — ABNORMAL LOW (ref >60)
GFR, Estimated: 10 mL/min — ABNORMAL LOW (ref >60)
Glucose, Bld: 117 mg/dL — ABNORMAL HIGH (ref 70–99)
Glucose, Bld: 125 mg/dL — ABNORMAL HIGH (ref 70–99)
Potassium: 4.5 mmol/L (ref 3.5–5.1)
Potassium: 5.3 mmol/L — ABNORMAL HIGH (ref 3.5–5.1)
Sodium: 137 mmol/L (ref 135–145)
Sodium: 134 mmol/L — ABNORMAL LOW (ref 135–145)

## 2023-01-01 LAB — CBC
HCT: 22.9 % — ABNORMAL LOW (ref 39.0–52.0)
Hemoglobin: 7.6 g/dL — ABNORMAL LOW (ref 13.0–17.0)
MCH: 29.6 pg (ref 26.0–34.0)
MCHC: 33.2 g/dL (ref 30.0–36.0)
MCV: 89.1 fL (ref 80.0–100.0)
Platelets: 293 10*3/uL (ref 150–400)
RBC: 2.57 MIL/uL — ABNORMAL LOW (ref 4.22–5.81)
RDW: 16.1 % — ABNORMAL HIGH (ref 11.5–15.5)
WBC: 15.6 10*3/uL — ABNORMAL HIGH (ref 4.0–10.5)
nRBC: 0 % (ref 0.0–0.2)

## 2023-01-01 LAB — GLUCOSE, CAPILLARY
Glucose-Capillary: 121 mg/dL — ABNORMAL HIGH (ref 70–99)
Glucose-Capillary: 109 mg/dL — ABNORMAL HIGH (ref 70–99)
Glucose-Capillary: 95 mg/dL (ref 70–99)
Glucose-Capillary: 120 mg/dL — ABNORMAL HIGH (ref 70–99)
Glucose-Capillary: 167 mg/dL — ABNORMAL HIGH (ref 70–99)
Glucose-Capillary: 122 mg/dL — ABNORMAL HIGH (ref 70–99)

## 2023-01-01 LAB — ALBUMIN: Albumin: 2.2 g/dL — ABNORMAL LOW (ref 3.5–5.0)

## 2023-01-01 LAB — MAGNESIUM: Magnesium: 2 mg/dL (ref 1.7–2.4)

## 2023-01-01 LAB — PHOSPHORUS: Phosphorus: 6.5 mg/dL — ABNORMAL HIGH (ref 2.5–4.6)

## 2023-01-01 MED ORDER — NOREPINEPHRINE 4 MG/250ML-% IV SOLN
2.0000 ug/min | INTRAVENOUS | Status: DC
  Administered 2023-01-01 (×2): 8 ug/min via INTRAVENOUS
  Administered 2023-01-01: 2 ug/min via INTRAVENOUS
  Administered 2023-01-02: 6 ug/min via INTRAVENOUS
  Administered 2023-01-02: 5 ug/min via INTRAVENOUS
  Administered 2023-01-02: 8 ug/min via INTRAVENOUS
  Administered 2023-01-02: 10 ug/min via INTRAVENOUS
  Administered 2023-01-02: 9 ug/min via INTRAVENOUS
  Filled 2023-01-01 (×4): qty 250

## 2023-01-01 MED ORDER — SODIUM CHLORIDE 0.9 % IV SOLN
250.0000 mL | INTRAVENOUS | Status: DC
  Administered 2023-01-01 – 2023-01-02 (×2): 250 mL via INTRAVENOUS

## 2023-01-01 NOTE — Progress Notes (Signed)
Patient rested quietly without any respiratory distress. No urine output. Will repeat bladder scan.

## 2023-01-01 NOTE — Progress Notes (Signed)
Levophed gtt started per order to keep SBP 140-150. Granddaughter updated.

## 2023-01-01 NOTE — Progress Notes (Signed)
IV watch placed on US guided PIV.

## 2023-01-01 NOTE — Progress Notes (Signed)
[facility] Cardiology    SUBJECTIVE: Resting comfortably in bed denies any significant chest pain no fever chills or sweats   Vitals:   01/01/23 1430 01/01/23 1500 01/01/23 1530 01/01/23 1600  BP: (!) 141/60 135/68 (!) 148/49 (!) 120/55  Pulse: (!) 118   70  Resp: '18 16 17 19  '$ Temp:      TempSrc:      SpO2: 90%   91%  Weight:      Height:         Intake/Output Summary (Last 24 hours) at 01/01/2023 1625 Last data filed at 01/01/2023 1522 Gross per 24 hour  Intake 2348.09 ml  Output 150 ml  Net 2198.09 ml      PHYSICAL EXAM  General: Well developed, well nourished, in no acute distress HEENT:  Normocephalic and atramatic Neck:  No JVD.  Lungs: Clear bilaterally to auscultation and percussion. Heart: HRRR . Normal S1 and S2 without gallops or murmurs.  Abdomen: Bowel sounds are positive, abdomen soft and non-tender  Msk:  Back normal, normal gait. Normal strength and tone for age. Extremities: No clubbing, cyanosis or edema.   Neuro: Alert and oriented X 3. Psych:  Good affect, responds appropriately   LABS: Basic Metabolic Panel: Recent Labs    12/31/22 0502 01/01/23 0508  NA 135 137  K 4.4 4.5  CL 108 105  CO2 15* 18*  GLUCOSE 115* 117*  BUN 78* 83*  CREATININE 4.28* 4.78*  CALCIUM 8.4* 8.3*  MG 2.0 2.0  PHOS 5.2* 6.5*   Liver Function Tests: Recent Labs    01/01/23 0508  ALBUMIN 2.2*   No results for input(s): "LIPASE", "AMYLASE" in the last 72 hours. CBC: Recent Labs    12/31/22 0502 01/01/23 0508  WBC 16.3* 15.6*  HGB 8.2* 7.6*  HCT 25.1* 22.9*  MCV 90.0 89.1  PLT 304 293   Cardiac Enzymes: No results for input(s): "CKTOTAL", "CKMB", "CKMBINDEX", "TROPONINI" in the last 72 hours. BNP: Invalid input(s): "POCBNP" D-Dimer: No results for input(s): "DDIMER" in the last 72 hours. Hemoglobin A1C: No results for input(s): "HGBA1C" in the last 72 hours. Fasting Lipid Panel: No results for input(s): "CHOL", "HDL", "LDLCALC", "TRIG", "CHOLHDL",  "LDLDIRECT" in the last 72 hours. Thyroid Function Tests: No results for input(s): "TSH", "T4TOTAL", "T3FREE", "THYROIDAB" in the last 72 hours.  Invalid input(s): "FREET3" Anemia Panel: No results for input(s): "VITAMINB12", "FOLATE", "FERRITIN", "TIBC", "IRON", "RETICCTPCT" in the last 72 hours.  No results found.   Echo reduced left ventricular function globally  TELEMETRY: Paced rhythm 66:  ASSESSMENT AND PLAN:  Principal Problem:   Severe sepsis with acute organ dysfunction (HCC) Active Problems:   Acute on chronic diastolic (congestive) heart failure (HCC)   Presence of cardiac pacemaker   Sick sinus syndrome (HCC)   Chronic kidney disease, stage 3b (HCC)   Paroxysmal atrial fibrillation (HCC)   Iron deficiency anemia, unspecified   Hyperlipidemia    Plan Shortness of breath dyspnea cardiomyopathy now with worsening renal function recommend adequate gentle hydration nephrology input Metabolic acidosis due to renal insufficiency recommend nephrology input consider bicarb consider medical therapy Sepsis severe continue broad-spectrum antibiotic therapy respiratory support supplemental oxygen as necessary Hypertension reasonably controlled continue antihypertensive but has been hypotensive has been on midodrine to help with symptom and blood pressure support Previous right lower extremity DVT with posterior tibial vein patient on 2.5 mg of Eliquis twice a day previous history of bleeding Acute hypoxic respiratory failure with COPD continue supplemental inhalers oxygen as  necessary Borderline troponins probably related to demand ischemia renal insufficiency recommend conservative management no indication or recommendation for invasive evaluation Continue aggressive supportive ICU level care    Yolonda Kida, MD, 01/01/2023 4:25 PM

## 2023-01-01 NOTE — Progress Notes (Signed)
[facility] Progress Note  Patient: Devin Washington    DUK:[id number]  DOA: 12/21/2022     Date of Service: the patient was seen and examined on 01/01/2023  Chief Complaint  Patient presents with   Respiratory Distress   Brief hospital course:  Devin Washington is a 89 y.o. male with medical history significant of atrial fibrillation, essential hypertension, chronic kidney disease stage IIIb, iron deficiency anemia, sick sinus syndrome status post pacemaker placement, hyperlipidemia, essential hypertension, chronic diastolic heart failure who was initially brought into the ER as a Devin Washington.  Patient was obtained there was significant hypoxic respiratory failure.  He is currently unable to give any history and is on BiPAP.  Patient arrived with oxygen sat of 62% on room air.  He was in respiratory distress on CPAP from EMS.  Workup so far showed severe sepsis with lactic acid of more than 9 and pH of 7.18.  Vitals are however stable.  Patient has chest x-ray that is negative.  Acute viral screen also negative.  No obvious source of sepsis.  At this point he is being admitted with acute hypoxic respiratory failure as well as severe sepsis of unknown source.  Workup is ongoing to determine the source.    Assessment and Plan:  # AKI on Chronic kidney disease stage III: Hydrate.  Monitor renal function Creatinine 3.75--3.2--2.6--2.5---3.69 --3.97--4.95 Monitor renal foods daily, monitor urine output Avoid nephrotoxic medications, use renal dose medications 1/14 US renal:  No evidence of obstructive uropathy. Mildly increased renal echogenicity as can be seen in the setting of medical renal disease. Bilateral pleural effusions. 1/15 again renal functions are deteriorating most likely due to hypotension  Bladder scan negative for any urinary retention,  1/17 AKI most likely secondary to prerenal due to hypotension, Nephrology consulted, recommended pressure support.  S/p Doxidope  1/18 Started  Levophed IV infusion    # Metabolic acidosis due to AKI CO2 81--15--21--17 --20 S/p Sodium bicarbonate IV push given,  and s/p sodium bicarbonate IV infusion till 1/12, transition to oral bicarbonate 1/17 started bicarbonate IV infusion due to metabolic acidosis.   Monitor BMP daily   # Severe sepsis with endorgan damage: Patient presented with severe hypoxic respiratory failure, although no evidence of pneumonia on chest x-ray.   CT C/a/p: Peripheral reticulations with bibasilar subpleural cystic changes suggestive of interstitial lung disease/pulmonary fibrosis. Urinalysis was not very impressive, urine culture multiple species, contamination Continue cefepime  (d/c'd Vanc & Flagyl) S/p vancomycin DC'd on 1/9, MRSA PCR negative, blood culture negative, WBC improving.  Procalcitonin 22 elevated at admission 1/12 CXR: Stable cardiac enlargement and chronic lung disease. No acute findings. 1/12 discontinued Flagyl, started empiric azithromycin 500 mg p.o. daily for 5 days Trend WBC count 1/15 CXR: Increased airspace disease at the left base may be due to atelectasis or pneumonia. Possible small left effusion.   # Hypertension and paroxysmal atrial fibrillation:  Rate was controlled on Home regimen. 1/16 patient was hypotensive, discontinued amlodipine and metoprolol home meds. s/p midodrine 10 mg p.o. TID. 1/17 s/p Dxidope which was discontinued and blood pressure did not improve.  On 1/18 Started Levophed IV infusion for pressure support as per nephrology recommendation patient developed AKI due to low blood pressure Monitor BP and titrate medications accordingly   Right lower extremity DVT Duplex shows DVT in the posterior tibial vein Vascular surgery consulted, recommended Eliquis 2.5 milligram p.o. 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  Recommend Eliquis 2.5 mg twice daily for 3 months. got  opinion via secure chat, no official consult done.  Follow-up outpatient    # Acute hypoxic respiratory failure with COPD exacerbation S/p BiPAP.  Continue antibiotics treatment and titrate of BiPAP S/p IV Solu-Medrol, discontinued steroids on 1/9 due to low hemoglobin, possible GI bleed.  Patient's respiratory failure resolved, currently saturating well on room air. Started Breo Ellipta inhaler, DuoNeb every 6 hourly scheduled followed by as needed Started Mucinex extremity gram p.o. twice daily and Tussionex for cough as needed   # Elevated troponin, type II MI most likely due to demand ischemia Troponin 189----476  continue to trend Continue to monitor on telemetry TTE shows LVEF 40 to 10%, grade 1 diastolic dysfunction, LV demonstrate regional wall motion abnormality EKG shows paced rhythm Patient remained chest pain-free Cardiology consulted, recommended no ischemic cardiac workup at this stage Started aspirin    # Anemia due to iron deficiency, monitor H&H Iron level 19, transferrin saturation 25% Folic acid 9.5 WNL Start oral iron supplement when stable, avoid IV iron due to active infection 1/9, Hb 6.4, transfuse 1 unit of PRBC 1/15 Hb 7.9 gradually dropping S/p Pantoprazole 40 mg IV BID, switch to p.o. Check FOBT   # Hypomagnesemia, mag repleted.  Resolved Monitor and replete as needed.   # severe lactic acidosis: Aggressively hydrate.  Continue antibiotics Lactic acidosis resolved, lactic acid 1.6 within normal range now. # sick sinus syndrome: Status post pacemaker placement.  Rate is now controlled.    # acute metabolic encephalopathy: Secondary to severe sepsis.  Resolved, patient is AOx3   # Pulmonary nodule, incidental finding on CT scan CT chest: Lingular subpleural 1.3 x 1 cm nodule. Consider one of the following in 3 months for both low-risk and high-risk individuals: (a) repeat chest CT, (b) follow-up PET-CT, or (c) tissue sampling. Patient was  recommended to follow with PCP and pulmonary as an outpatient  PAD, abdominal aortic aneurysm CT A/P: Stable aneurysmal bilateral common iliac arteries. 6. Stable aneurysmal infrarenal abdominal aorta (3.4 cm). Recommend annual imaging followup by CTA or MRA.  Patient was recommended to follow with PCP for incidental findings as above  Vitamin D insufficiency: started vitamin D 50,000 units p.o. weekly, follow with PCP to repeat vitamin D level after 3 to 6 months.  Body mass index is 21.81 kg/m.  Interventions:    PT and OT eval done, recommend SNF placement     Diet: Heart healthy diet DVT Prophylaxis: Subcutaneous Heparin    Advance goals of care discussion: DNR  Family Communication: family was not present at bedside, at the time of interview.  The pt provided permission to discuss medical plan with the family. Opportunity was given to ask question and all questions were answered satisfactorily.  1/15 called patient's granddaughter and left VM 1/16 discussed with patient's granddaughter over the phone, all question and concerns answered.  Disposition:  Pt is from Home, admitted with sepsis, developed AKI, metabolic acidosis, and hypotension, still creatinine is elevated, started bicarb IV infusion and Levophed IV infusion, which precludes a safe discharge. Discharge to SNF, when clinically stable, may need few more days to improve. 1/18 Palliative care consulted, overall prognosis is very poor, patient may qualify for hospice if no improvement in the renal failure.   Subjective:  No significant overnight events, patient states that he is not feeling good, unable to describe specific complaints, seems a little bit short of breath.  Denies any chest pain or  palpitations. Patient was informed that he has significant renal failure and there is no improvement, gradually it is getting worse.  But patient has dementia I doubt that he would remember.   Physical Exam: General: lying  comfortably, mild  SOB, acidotic breathing Appear in mild distress, affect seems depressed Eyes: PERRLA ENT: Oral Mucosa Clear, moist  Neck: no JVD,  Cardiovascular: S1 and S2 Present, no Murmur,  Respiratory: good respiratory effort, Bilateral Air entry equal and Decreased, mild Crackles and no significant wheezing appreciated Abdomen: Bowel Sound present, Soft and no tenderness,  Skin: no rashes Extremities: 2-3+ edema b/l LE, Left calf thrombosed chronic aneurysm Neurologic: without any new focal findings Gait not checked due to patient safety concerns  Vitals:   01/01/23 1530 01/01/23 1600 01/01/23 1630 01/01/23 1700  BP: (!) 148/49 (!) 120/55 (!) 139/43 (!) 125/53  Pulse:  70    Resp: '17 19 17 '$ (!) 21  Temp:      TempSrc:      SpO2:  91%    Weight:      Height:        Intake/Output Summary (Last 24 hours) at 01/01/2023 1712 Last data filed at 01/01/2023 1522 Gross per 24 hour  Intake 2153.09 ml  Output 150 ml  Net 2003.09 ml   Filed Weights   12/22/22 0019 12/31/22 1550  Weight: 68.9 kg 79.2 kg    Data Reviewed: I have personally reviewed and interpreted daily labs, tele strips, imagings as discussed above. I reviewed all nursing notes, pharmacy notes, vitals, pertinent old records I have discussed plan of care as described above with RN and patient/family.  CBC: Recent Labs  Lab 12/28/22 0526 12/29/22 0308 12/30/22 0502 12/31/22 0502 01/01/23 0508  WBC 17.6* 15.3* 12.9* 16.3* 15.6*  HGB 8.8* 7.9* 8.1* 8.2* 7.6*  HCT 27.1* 23.6* 25.0* 25.1* 22.9*  MCV 90.0 88.4 89.3 90.0 89.1  PLT 202 220 269 304 903   Basic Metabolic Panel: Recent Labs  Lab 12/26/22 0639 12/27/22 0436 12/29/22 0308 12/30/22 0502 12/31/22 0502 01/01/23 0508 01/01/23 1604  NA 137   < > 135 135 135 137 134*  K 3.6   < > 3.8 4.0 4.4 4.5 5.3*  CL 109   < > 108 106 108 105 104  CO2 21*   < > 21* 20* 15* 18* 18*  GLUCOSE 120*   < > 120* 115* 115* 117* 125*  BUN 46*   < > 70* 75* 78*  83* 83*  CREATININE 2.67*   < > 3.69* 3.97* 4.28* 4.78* 4.95*  CALCIUM 7.9*   < > 8.0* 8.3* 8.4* 8.3* 8.4*  MG 2.0  --  1.9 2.0 2.0 2.0  --   PHOS 2.4*  --   --  4.9* 5.2* 6.5*  --    < > = values in this interval not displayed.    Studies: No results found.  Scheduled Meds:  apixaban  2.5 mg Oral BID   vitamin C  500 mg Oral Daily   aspirin EC  81 mg Oral Daily   atorvastatin  20 mg Oral Daily   Chlorhexidine Gluconate Cloth  6 each Topical Q0600   fluticasone furoate-vilanterol  1 puff Inhalation Daily   guaiFENesin  600 mg Oral BID   iron polysaccharides  150 mg Oral Daily   pantoprazole  40 mg Oral BID AC   traZODone  25 mg Oral QHS   Vitamin D (Ergocalciferol)  50,000 Units Oral Q7 days  Continuous Infusions:  sodium chloride     sodium chloride 10 mL/hr at 01/01/23 1522   norepinephrine (LEVOPHED) Adult infusion 8 mcg/min (01/01/23 1522)   sodium bicarbonate 75 mEq in sodium chloride 0.45 % 1,075 mL infusion 30 mL/hr at 01/01/23 1708   PRN Meds: acetaminophen **OR** acetaminophen, albuterol, chlorpheniramine-HYDROcodone, ipratropium-albuterol, ondansetron **OR** ondansetron (ZOFRAN) IV, mouth rinse, simethicone  Time spent: 50 minutes  Author: Val Riles. MD [facility] 01/01/2023 5:12 PM  To reach On-call, see care teams to locate the attending and reach out to them via [email or web address]. If 7PM-7AM, please contact night-coverage If you still have difficulty reaching the attending provider, please page the [facility] (Director on Call) for [facility] on amion for assistance.

## 2023-01-01 NOTE — Progress Notes (Signed)
Dr. Dwyane Dee notified that pt has had no urine output overnight or this morning. Orders received to titrate levo gtt until SBP 140 reached.

## 2023-01-01 NOTE — TOC Progression Note (Signed)
Transition of Care [facility]) - Progression Note    Patient Details  Name: Devin Washington MRN: [id number] Date of Birth: 1923-10-20  Transition of Care [facility]) CM/SW Contact  Shelbie Hutching, RN Phone Number: 01/01/2023, 4:10 PM  Clinical Narrative:    [place]land Falling insurance auth approved for sub acute level 1 from 1/18 - 1/24, review due 1/25. Certification: [id number]. Reviewer is Pryor Montes, phone: [phone], fax: [phone].    Expected Discharge Plan: [place] Barriers to Discharge: Continued Medical Work up  Expected Discharge Plan and Services                                               Social Determinants of Health (SDOH) Interventions SDOH Screenings   Food Insecurity: No Food Insecurity (12/22/2022)  Housing: Low Risk  (12/22/2022)  Transportation Needs: No Transportation Needs (12/22/2022)  Utilities: Not At Risk (12/22/2022)  Tobacco Use: Medium Risk (12/22/2022)    Readmission Risk Interventions     No data to display

## 2023-01-01 NOTE — Progress Notes (Signed)
Occupational Therapy Re-Evaluation Patient Details Name: TAYSON SCHNELLE MRN: [id number] DOB: 1923-11-10 Today's Date: 01/01/2023   History of present illness Pt is a 89 y/o M admitted on 12/21/22 after presenting with acute hypoxic repsiratory failure & placed on BiPAP. Work up revealed sepsis of unknown source. Pt also found to have DVT. PMH: a-fib, HTN, chronic renal sufficiency, anemia, SSS, permanent pacemaker, HLD, DM, aortic stenosis   OT comments  Mr Fotheringham was seen for OT/PT co re-evaluation on this date. Upon arrival to room pt reclined in bed, awkes to voice and agreeable to tx. Pt is alert and conversive t/o session, daughter in to visit end of session. Pt requires MOD A x2 sup<>sit. Intermittent MIN A static sitting improved to CGA, pt tolerates ~8 min, reports increased WOB, SpO2 stable high 90s on 4L [place]. Goals reviewed and remain appropriate, will continue to follow POC. Discharge recommendation remains appropriate.     Recommendations for follow up therapy are one component of a multi-disciplinary discharge planning process, led by the attending physician.  Recommendations may be updated based on patient status, additional functional criteria and insurance authorization.    Follow Up Recommendations  Skilled nursing-short term rehab (<3 hours/day)     Assistance Recommended at Discharge Intermittent Supervision/Assistance  Patient can return home with the following  A lot of help with walking and/or transfers;A lot of help with bathing/dressing/bathroom   Equipment Recommendations  Other (comment) (defer)    Recommendations for Other Services      Precautions / Restrictions Precautions Precautions: Fall Restrictions Weight Bearing Restrictions: No       Mobility Bed Mobility Overal bed mobility: Needs Assistance Bed Mobility: Supine to Sit, Sit to Supine     Supine to sit: Mod assist, +2 for physical assistance, HOB elevated Sit to supine: Mod assist, +2 for  physical assistance        Transfers                   General transfer comment: pt deferred, poor sitting tolerance, increased WOB sitting     Balance Overall balance assessment: Needs assistance Sitting-balance support: Bilateral upper extremity supported, Feet supported Sitting balance-Leahy Scale: Fair                                     ADL either performed or assessed with clinical judgement   ADL Overall ADL's : Needs assistance/impaired                                       General ADL Comments: MIN A don gown bed level. MAX A for LB access seated EOB. Anticipate +2 for toileting    Extremity/Trunk Assessment Upper Extremity Assessment Upper Extremity Assessment: Overall WFL for tasks assessed   Lower Extremity Assessment Lower Extremity Assessment: Generalized weakness         Cognition Arousal/Alertness: Awake/alert Behavior During Therapy: WFL for tasks assessed/performed Overall Cognitive Status: Within Functional Limits for tasks assessed                                 General Comments: alert abd conversive t/o session              General Comments increased WOB however SpO2 stable in high  90s    Pertinent Vitals/ Pain       Pain Assessment Pain Assessment: No/denies pain   Frequency  Min 2X/week        Progress Toward Goals  OT Goals(current goals can now be found in the care plan section)  Progress towards OT goals: Progressing toward goals  Acute Rehab OT Goals Patient Stated Goal: to walk OT Goal Formulation: With patient Time For Goal Achievement: 01/15/23 Potential to Achieve Goals: Good ADL Goals Pt Will Perform Grooming: with modified independence;standing Pt Will Perform Lower Body Dressing: with modified independence;sit to/from stand Pt Will Transfer to Toilet: with modified independence;ambulating;regular height toilet  Plan Discharge plan remains  appropriate;Frequency remains appropriate    Co-evaluation    PT/OT/SLP Co-Evaluation/Treatment: Yes Reason for Co-Treatment: For patient/therapist safety;To address functional/ADL transfers PT goals addressed during session: Mobility/safety with mobility OT goals addressed during session: ADL's and self-care      AM-PAC OT "6 Clicks" Daily Activity     Outcome Measure   Help from another person eating meals?: None Help from another person taking care of personal grooming?: A Lot Help from another person toileting, which includes using toliet, bedpan, or urinal?: A Lot Help from another person bathing (including washing, rinsing, drying)?: A Lot Help from another person to put on and taking off regular upper body clothing?: A Little Help from another person to put on and taking off regular lower body clothing?: A Little 6 Click Score: 16    End of Session    OT Visit Diagnosis: Other abnormalities of gait and mobility (R26.89);Muscle weakness (generalized) (M62.81)   Activity Tolerance Patient tolerated treatment well   Patient Left in bed;with call bell/phone within reach;with family/visitor present   Nurse Communication Mobility status        Time: 9381-0175 OT Time Calculation (min): 17 min  Charges: OT General Charges $OT Visit: 1 Visit OT Evaluation $OT Re-eval: 1 Re-eval  Dessie Coma, M.S. OTR/L  01/01/23, 2:29 PM  ascom [phone]

## 2023-01-01 NOTE — Evaluation (Signed)
Physical Therapy Re-Evaluation Patient Details Name: Devin Washington MRN: [id number] DOB: 11/27/1923 Today's Date: 01/01/2023  History of Present Illness  Pt is a 89 y/o M admitted on 12/21/22 after presenting with acute hypoxic repsiratory failure & placed on BiPAP. Work up revealed sepsis of unknown source. Pt also found to have DVT. PMH: a-fib, HTN, chronic renal sufficiency, anemia, SSS, permanent pacemaker, HLD, DM, aortic stenosis.   Clinical Impression  Pt receiving new PT orders as pt transferred to ICU needing higher level of care. PT/OT re-initiating PT services with pt upright in bed agreeable to participate. Per previous eval, prior to admission pt lives alone and is mod-I with RW. To date he is reliant on modA+2 and bed features to sit EOB. He is able to sit EoB for ~ 2-3 minutes with UE and Feet support with minimal posterior lean but is able to hold himself up this date with close supervision and interact with his daughter at bedside. He is more alert today, more conversant and tolerating increased sitting compared to previous sessions without L lateral lean. He is quick to become fatigued and shake in his UE's trying to support himself requesting to return to supine with modA +2 back in bed. Pt's goals updated to reflect current functional capacity. Will continue to rec STR at discharge due to deconditioning from acute admission limiting pt in safe participation with OOB mobility as pt remains clearly below functional baseline.      Recommendations for follow up therapy are one component of a multi-disciplinary discharge planning process, led by the attending physician.  Recommendations may be updated based on patient status, additional functional criteria and insurance authorization.  Follow Up Recommendations Skilled nursing-short term rehab (<3 hours/day) Can patient physically be transported by private vehicle: No    Assistance Recommended at Discharge Frequent or constant  Supervision/Assistance  Patient can return home with the following  Assist for transportation;Help with stairs or ramp for entrance;A little help with walking and/or transfers;Assistance with cooking/housework    Equipment Recommendations None recommended by PT  Recommendations for Other Services       Functional Status Assessment Patient has had a recent decline in their functional status and demonstrates the ability to make significant improvements in function in a reasonable and predictable amount of time.     Precautions / Restrictions Precautions Precautions: Fall Restrictions Weight Bearing Restrictions: No      Mobility  Bed Mobility   Bed Mobility: Supine to Sit, Sit to Supine     Supine to sit: Mod assist, +2 for physical assistance, HOB elevated       Patient Response: Cooperative  Transfers                        Ambulation/Gait                  Stairs            Wheelchair Mobility    Modified Rankin (Stroke Patients Only)       Balance Overall balance assessment: Needs assistance Sitting-balance support: Bilateral upper extremity supported, Feet supported Sitting balance-Leahy Scale: Fair                                       Pertinent Vitals/Pain Pain Assessment Pain Assessment: Faces Faces Pain Scale: No hurt    Home Living Family/patient expects to be  discharged to:: Private residence Living Arrangements: Alone Available Help at Discharge: Family;Neighbor;Available PRN/intermittently Type of Home: House Home Access: Stairs to enter Entrance Stairs-Rails: Psychiatric nurse of Steps: 2   Home Layout: One level Home Equipment: Shower seat      Prior Function               Mobility Comments: Pt reports he was mod I with RW, ambulatory, driving. ADLs Comments: Pt reports he was able to bathe & dress himself. Initially reports someone assists him with getting groceries then  later states he was driving to the store to get them himself.     Hand Dominance        Extremity/Trunk Assessment   Upper Extremity Assessment Upper Extremity Assessment: Overall WFL for tasks assessed    Lower Extremity Assessment Lower Extremity Assessment: Generalized weakness       Communication   Communication: HOH  Cognition Arousal/Alertness: Awake/alert Behavior During Therapy: WFL for tasks assessed/performed Overall Cognitive Status: Within Functional Limits for tasks assessed                                 General Comments: Alert with more conversation with therapy today.        General Comments General comments (skin integrity, edema, etc.): increased WOB however SpO2 stable in high 90s    Exercises Other Exercises Other Exercises: Benefits of functional mobility to pt's capacity for skin integrity, strength with functional mobility.   Assessment/Plan    PT Assessment Patient needs continued PT services  PT Problem List Decreased strength;Cardiopulmonary status limiting activity;Decreased activity tolerance;Decreased knowledge of use of DME;Decreased balance;Decreased mobility       PT Treatment Interventions DME instruction;Therapeutic exercise;Gait training;Balance training;Stair training;Functional mobility training;Therapeutic activities;Patient/family education;Neuromuscular re-education    PT Goals (Current goals can be found in the Care Plan section)  Acute Rehab PT Goals Patient Stated Goal: get better, stronger PT Goal Formulation: With patient Time For Goal Achievement: 01/15/23 Potential to Achieve Goals: Fair    Frequency Min 2X/week     Co-evaluation PT/OT/SLP Co-Evaluation/Treatment: Yes Reason for Co-Treatment: For patient/therapist safety;To address functional/ADL transfers PT goals addressed during session: Mobility/safety with mobility OT goals addressed during session: ADL's and self-care       AM-PAC PT  "6 Clicks" Mobility  Outcome Measure Help needed turning from your back to your side while in a flat bed without using bedrails?: A Little Help needed moving from lying on your back to sitting on the side of a flat bed without using bedrails?: A Little Help needed moving to and from a bed to a chair (including a wheelchair)?: A Lot Help needed standing up from a chair using your arms (e.g., wheelchair or bedside chair)?: A Lot Help needed to walk in hospital room?: A Lot Help needed climbing 3-5 steps with a railing? : Total 6 Click Score: 13    End of Session   Activity Tolerance: Patient limited by lethargy;Patient limited by fatigue;Other (comment) (dizziness) Patient left: in bed;with call bell/phone within reach;with bed alarm set;with family/visitor present Nurse Communication: Mobility status PT Visit Diagnosis: Muscle weakness (generalized) (M62.81);Difficulty in walking, not elsewhere classified (R26.2);Unsteadiness on feet (R26.81)    Time: 7425-9563 PT Time Calculation (min) (ACUTE ONLY): 17 min   Charges:   PT Evaluation $PT Eval Moderate Complexity: Ranburne M. Fairly IV, PT, DPT Physical Therapist-  [facility]  01/01/2023, 2:51 PM

## 2023-01-01 NOTE — Progress Notes (Addendum)
[facility]  ROUNDING NOTE   Subjective:   Patient seen and evaluated at bedside in ICU  Alert and oriented Granddaughter at bedside, states patient moderately independent at home, lives alone and ambulates with walker Family lives nearby Patient remains on room air Lower extremity edema remains  Patient seen later in morning with Levophed.  Objective:  Vital signs in last 24 hours:  Temp:  [97.6 F (36.4 C)-97.8 F (36.6 C)] 97.6 F (36.4 C) (01/18 0800) Pulse Rate:  [30-132] 67 (01/18 0930) Resp:  [11-31] 18 (01/18 0930) BP: (88-164)/(35-99) 107/52 (01/18 0930) SpO2:  [53 %-100 %] 83 % (01/18 0930) Weight:  [79.2 kg] 79.2 kg (01/17 1550)  Weight change:  Filed Weights   12/22/22 0019 12/31/22 1550  Weight: 68.9 kg 79.2 kg    Intake/Output: I/O last 3 completed shifts: In: 2162.6 [P.O.:360; I.V.:1802.6] Out: 330 [Urine:330]   Intake/Output this shift:  Total I/O In: 453.4 [P.O.:30; I.V.:423.4] Out: 0   Physical Exam: General: NAD  Head: Normocephalic, atraumatic. Moist oral mucosal membranes  Eyes: Anicteric  Lungs:  Clear to auscultation, normal effort, room air  Heart: Regular rate and rhythm  Abdomen:  Soft, nontender  Extremities:  2-3+ peripheral edema.  Neurologic: Alert and oriented, moving all four extremities  Skin: No lesions  Access: None    Basic Metabolic Panel: Recent Labs  Lab 12/26/22 0639 12/27/22 0436 12/28/22 0526 12/29/22 0308 12/30/22 0502 12/31/22 0502 01/01/23 0508  NA 137   < > 134* 135 135 135 137  K 3.6   < > 4.0 3.8 4.0 4.4 4.5  CL 109   < > 108 108 106 108 105  CO2 21*   < > 17* 21* 20* 15* 18*  GLUCOSE 120*   < > 106* 120* 115* 115* 117*  BUN 46*   < > 60* 70* 75* 78* 83*  CREATININE 2.67*   < > 3.22* 3.69* 3.97* 4.28* 4.78*  CALCIUM 7.9*   < > 7.7* 8.0* 8.3* 8.4* 8.3*  MG 2.0  --   --  1.9 2.0 2.0 2.0  PHOS 2.4*  --   --   --  4.9* 5.2* 6.5*   < > = values in this interval not displayed.     Liver Function Tests: No results for input(s): "AST", "ALT", "ALKPHOS", "BILITOT", "PROT", "ALBUMIN" in the last 168 hours. No results for input(s): "LIPASE", "AMYLASE" in the last 168 hours. No results for input(s): "AMMONIA" in the last 168 hours.  CBC: Recent Labs  Lab 12/28/22 0526 12/29/22 0308 12/30/22 0502 12/31/22 0502 01/01/23 0508  WBC 17.6* 15.3* 12.9* 16.3* 15.6*  HGB 8.8* 7.9* 8.1* 8.2* 7.6*  HCT 27.1* 23.6* 25.0* 25.1* 22.9*  MCV 90.0 88.4 89.3 90.0 89.1  PLT 202 220 269 304 293    Cardiac Enzymes: No results for input(s): "CKTOTAL", "CKMB", "CKMBINDEX", "TROPONINI" in the last 168 hours.  BNP: Invalid input(s): "POCBNP"  CBG: Recent Labs  Lab 12/31/22 1633 12/31/22 1937 12/31/22 2333 01/01/23 0404 01/01/23 0746  GLUCAP 83 135* 115* 121* 109*    Microbiology: Results for orders placed or performed during the hospital encounter of 12/21/22  Resp panel by RT-PCR (RSV, Flu A&B, Covid) Anterior Nasal Swab     Status: None   Collection Time: 12/21/22  6:54 PM   Specimen: Anterior Nasal Swab  Result Value Ref Range Status   SARS Coronavirus 2 by RT PCR NEGATIVE NEGATIVE Final    Comment: (NOTE) SARS-CoV-2 target nucleic acids are  NOT DETECTED.  The SARS-CoV-2 RNA is generally detectable in upper respiratory specimens during the acute phase of infection. The lowest concentration of SARS-CoV-2 viral copies this assay can detect is 138 copies/mL. A negative result does not preclude SARS-Cov-2 infection and should not be used as the sole basis for treatment or other patient management decisions. A negative result may occur with  improper specimen collection/handling, submission of specimen other than nasopharyngeal swab, presence of viral mutation(s) within the areas targeted by this assay, and inadequate number of viral copies(<138 copies/mL). A negative result must be combined with clinical observations, patient history, and  epidemiological information. The expected result is Negative.  Fact Sheet for Patients:  EntrepreneurPulse.com.au  Fact Sheet for Healthcare Providers:  IncredibleEmployment.be  This test is no t yet approved or cleared by the [place] FDA and  has been authorized for detection and/or diagnosis of SARS-CoV-2 by FDA under an Emergency Use Authorization (EUA). This EUA will remain  in effect (meaning this test can be used) for the duration of the COVID-19 declaration under Section 564(b)(1) of the Act, 21 U.S.C.section 360bbb-3(b)(1), unless the authorization is terminated  or revoked sooner.       Influenza A by PCR NEGATIVE NEGATIVE Final   Influenza B by PCR NEGATIVE NEGATIVE Final    Comment: (NOTE) The Xpert Xpress SARS-CoV-2/FLU/RSV plus assay is intended as an aid in the diagnosis of influenza from Nasopharyngeal swab specimens and should not be used as a sole basis for treatment. Nasal washings and aspirates are unacceptable for Xpert Xpress SARS-CoV-2/FLU/RSV testing.  Fact Sheet for Patients: EntrepreneurPulse.com.au  Fact Sheet for Healthcare Providers: IncredibleEmployment.be  This test is not yet approved or cleared by the [place] FDA and has been authorized for detection and/or diagnosis of SARS-CoV-2 by FDA under an Emergency Use Authorization (EUA). This EUA will remain in effect (meaning this test can be used) for the duration of the COVID-19 declaration under Section 564(b)(1) of the Act, 21 U.S.C. section 360bbb-3(b)(1), unless the authorization is terminated or revoked.     Resp Syncytial Virus by PCR NEGATIVE NEGATIVE Final    Comment: (NOTE) Fact Sheet for Patients: EntrepreneurPulse.com.au  Fact Sheet for Healthcare Providers: IncredibleEmployment.be  This test is not yet approved or cleared by the [place] FDA and has been  authorized for detection and/or diagnosis of SARS-CoV-2 by FDA under an Emergency Use Authorization (EUA). This EUA will remain in effect (meaning this test can be used) for the duration of the COVID-19 declaration under Section 564(b)(1) of the Act, 21 U.S.C. section 360bbb-3(b)(1), unless the authorization is terminated or revoked.  Performed at [facility], [address] [place], [place] [address]   Blood Culture (routine x 2)     Status: None   Collection Time: 12/21/22  6:54 PM   Specimen: BLOOD  Result Value Ref Range Status   Specimen Description BLOOD BLOOD RIGHT FOREARM  Final   Special Requests   Final    BOTTLES DRAWN AEROBIC AND ANAEROBIC Blood Culture adequate volume   Culture   Final    NO GROWTH 5 DAYS Performed at [facility], [address] [place], [place] [address]    Report Status 12/26/2022 FINAL  Final  Blood Culture (routine x 2)     Status: None   Collection Time: 12/21/22  6:59 PM   Specimen: BLOOD  Result Value Ref Range Status   Specimen Description BLOOD BLOOD LEFT FOREARM  Final   Special Requests  Final    BOTTLES DRAWN AEROBIC AND ANAEROBIC Blood Culture adequate volume   Culture   Final    NO GROWTH 5 DAYS Performed at [facility], [address] [place], [place] [address]    Report Status 12/26/2022 FINAL  Final  Urine Culture     Status: Abnormal   Collection Time: 12/22/22 12:12 AM   Specimen: In/Out Cath Urine  Result Value Ref Range Status   Specimen Description   Final    IN/OUT CATH URINE Performed at [facility], [address] [place], [place] [address]    Special Requests   Final    NONE Performed at [facility], [address] [place], [place] [address]    Culture MULTIPLE SPECIES PRESENT, SUGGEST RECOLLECTION (A)  Final   Report Status 12/23/2022 FINAL  Final  MRSA Next Gen by PCR, Nasal     Status: None   Collection Time: 12/22/22 10:30 PM   Specimen: Nasal  Mucosa; Nasal Swab  Result Value Ref Range Status   MRSA by PCR Next Gen NOT DETECTED NOT DETECTED Final    Comment: (NOTE) The GeneXpert MRSA Assay (FDA approved for NASAL specimens only), is one component of a comprehensive MRSA colonization surveillance program. It is not intended to diagnose MRSA infection nor to guide or monitor treatment for MRSA infections. Test performance is not FDA approved in patients less than 58 years old. Performed at [facility], [address] [place], [place] [address]     Coagulation Studies: No results for input(s): "LABPROT", "INR" in the last 72 hours.  Urinalysis: No results for input(s): "COLORURINE", "LABSPEC", "PHURINE", "GLUCOSEU", "HGBUR", "BILIRUBINUR", "KETONESUR", "PROTEINUR", "UROBILINOGEN", "NITRITE", "LEUKOCYTESUR" in the last 72 hours.  Invalid input(s): "APPERANCEUR"    Imaging: No results found.   Medications:    sodium chloride     sodium chloride 250 mL (01/01/23 0933)   norepinephrine (LEVOPHED) Adult infusion 2 mcg/min (01/01/23 0937)   sodium bicarbonate 75 mEq in sodium chloride 0.45 % 1,075 mL infusion 75 mL/hr at 01/01/23 0841    apixaban  2.5 mg Oral BID   vitamin C  500 mg Oral Daily   aspirin EC  81 mg Oral Daily   atorvastatin  20 mg Oral Daily   Chlorhexidine Gluconate Cloth  6 each Topical Q0600   fluticasone furoate-vilanterol  1 puff Inhalation Daily   guaiFENesin  600 mg Oral BID   insulin aspart  0-9 Units Subcutaneous Q4H   iron polysaccharides  150 mg Oral Daily   pantoprazole  40 mg Oral BID AC   traZODone  25 mg Oral QHS   Vitamin D (Ergocalciferol)  50,000 Units Oral Q7 days   acetaminophen **OR** acetaminophen, albuterol, chlorpheniramine-HYDROcodone, ipratropium-albuterol, ondansetron **OR** ondansetron (ZOFRAN) IV, mouth rinse, simethicone  Assessment/ Plan:  Devin Washington is a 89 y.o.  male  with past medical conditions of a fib, anemia, sick sinus syndrome, chronic  diastolic heart failure, hyperlipidemia, hypertension, and chronic kidney disease, who was admitted to [facility] on 12/21/2022 for Acute respiratory failure with hypoxia ([place]) [J96.01] AKI (acute kidney injury) ([place]) [N17.9] Septic shock ([place]) [A41.9, R65.21] Severe sepsis with acute organ dysfunction ([place]) [A41.9, R65.20]   Acute kidney injury on chronic kidney disease stage IV. Baseline creatinine 2.42 with GFR 24 in February 2023. Acute kidney injury likely secondary to hypotension and severe illness. Creatinine on admission 3.75 with initial improvement to 2.36. No IV contrast exposure during this admission.  Creatinine continues to  decline with low urine output. Bladder scan shows 180m this morning. Patient would be agreeable to initiate dialysis, if needed. Discussed with patient and family that due to extensive cardiac history, dialysis would be challenging.   Lab Results  Component Value Date   CREATININE 4.78 (H) 01/01/2023   CREATININE 4.28 (H) 12/31/2022   CREATININE 3.97 (H) 12/30/2022    Intake/Output Summary (Last 24 hours) at 01/01/2023 0949 Last data filed at 01/01/2023 0841 Gross per 24 hour  Intake 2022.24 ml  Output 100 ml  Net 1922.24 ml   2. Anemia of chronic kidney disease Lab Results  Component Value Date   HGB 7.6 (L) 01/01/2023    Hgb remains decreased, may require ESA.   3. Hypotension due to sepsis. All antihypertensives held. Receiving midodrine for BP support.  Blood pressure remains soft for this patient, 109/38.  Patient has now been initiated on Levophed for blood pressure support.   4.  Acute metabolic acidosis.  Serum bicarb 10 on admission. Remains on sodium bicarb infusion. Current serum bicarb level 18.  Will decrease IV fluids to sodium bicarb 30 mL/h.     LOS: 1[place]1/18/20249:49 AM

## 2023-01-02 DIAGNOSIS — Z7189 Other specified counseling: Secondary | ICD-10-CM

## 2023-01-02 DIAGNOSIS — R6520 Severe sepsis without septic shock: Secondary | ICD-10-CM | POA: Diagnosis not present

## 2023-01-02 DIAGNOSIS — A419 Sepsis, unspecified organism: Secondary | ICD-10-CM | POA: Diagnosis not present

## 2023-01-02 LAB — BASIC METABOLIC PANEL
Anion gap: 12 (ref 5–15)
Anion gap: 13 (ref 5–15)
BUN: 87 mg/dL — ABNORMAL HIGH (ref 8–23)
BUN: 86 mg/dL — ABNORMAL HIGH (ref 8–23)
CO2: 20 mmol/L — ABNORMAL LOW (ref 22–32)
CO2: 19 mmol/L — ABNORMAL LOW (ref 22–32)
Calcium: 8.3 mg/dL — ABNORMAL LOW (ref 8.9–10.3)
Calcium: 8.1 mg/dL — ABNORMAL LOW (ref 8.9–10.3)
Chloride: 103 mmol/L (ref 98–111)
Chloride: 100 mmol/L (ref 98–111)
Creatinine, Ser: 5.21 mg/dL — ABNORMAL HIGH (ref 0.61–1.24)
Creatinine, Ser: 5.42 mg/dL — ABNORMAL HIGH (ref 0.61–1.24)
GFR, Estimated: 9 mL/min — ABNORMAL LOW (ref >60)
GFR, Estimated: 9 mL/min — ABNORMAL LOW (ref >60)
Glucose, Bld: 128 mg/dL — ABNORMAL HIGH (ref 70–99)
Glucose, Bld: 131 mg/dL — ABNORMAL HIGH (ref 70–99)
Potassium: 5.2 mmol/L — ABNORMAL HIGH (ref 3.5–5.1)
Potassium: 4.8 mmol/L (ref 3.5–5.1)
Sodium: 135 mmol/L (ref 135–145)
Sodium: 132 mmol/L — ABNORMAL LOW (ref 135–145)

## 2023-01-02 LAB — GLUCOSE, CAPILLARY
Glucose-Capillary: 110 mg/dL — ABNORMAL HIGH (ref 70–99)
Glucose-Capillary: 127 mg/dL — ABNORMAL HIGH (ref 70–99)
Glucose-Capillary: 180 mg/dL — ABNORMAL HIGH (ref 70–99)
Glucose-Capillary: 172 mg/dL — ABNORMAL HIGH (ref 70–99)
Glucose-Capillary: 135 mg/dL — ABNORMAL HIGH (ref 70–99)

## 2023-01-02 LAB — CBC
HCT: 24.7 % — ABNORMAL LOW (ref 39.0–52.0)
Hemoglobin: 8.1 g/dL — ABNORMAL LOW (ref 13.0–17.0)
MCH: 29.3 pg (ref 26.0–34.0)
MCHC: 32.8 g/dL (ref 30.0–36.0)
MCV: 89.5 fL (ref 80.0–100.0)
Platelets: 352 10*3/uL (ref 150–400)
RBC: 2.76 MIL/uL — ABNORMAL LOW (ref 4.22–5.81)
RDW: 16.2 % — ABNORMAL HIGH (ref 11.5–15.5)
WBC: 17.4 10*3/uL — ABNORMAL HIGH (ref 4.0–10.5)
nRBC: 0 % (ref 0.0–0.2)

## 2023-01-02 MED ORDER — LACTATED RINGERS IV BOLUS
1000.0000 mL | Freq: Once | INTRAVENOUS | Status: AC
  Administered 2023-01-02: 1000 mL via INTRAVENOUS

## 2023-01-02 MED ORDER — HYDROMORPHONE HCL 1 MG/ML IJ SOLN: 0.2500 mg | INTRAMUSCULAR | Status: DC | PRN

## 2023-01-02 MED ORDER — HYDROCORTISONE SOD SUC (PF) 100 MG IJ SOLR
100.0000 mg | Freq: Three times a day (TID) | INTRAMUSCULAR | Status: DC
  Administered 2023-01-02 (×2): 100 mg via INTRAVENOUS
  Filled 2023-01-02 (×3): qty 2

## 2023-01-02 MED ORDER — SODIUM CHLORIDE 0.9 % IV SOLN: 500.0000 mg | INTRAVENOUS | Status: DC

## 2023-01-02 MED ORDER — MIDODRINE HCL 5 MG PO TABS
10.0000 mg | ORAL_TABLET | Freq: Three times a day (TID) | ORAL | Status: DC
  Administered 2023-01-02 (×2): 10 mg via ORAL
  Filled 2023-01-02 (×2): qty 2

## 2023-01-02 MED ORDER — PIPERACILLIN-TAZOBACTAM IN DEX 2-0.25 GM/50ML IV SOLN
2.2500 g | Freq: Three times a day (TID) | INTRAVENOUS | Status: DC
  Administered 2023-01-02 (×2): 2.25 g via INTRAVENOUS
  Filled 2023-01-02 (×2): qty 50

## 2023-01-02 NOTE — Progress Notes (Signed)
   01/02/23 0315  Charting Type  Charting Type Full Reassessment Changes Noted  Focused Reassessment No Changes Respiratory  Respiratory  Respiratory (WDL) X  Respiratory Pattern Labored;Symmetrical;Orthopnea  Chest Assessment Chest expansion symmetrical  Bilateral Breath Sounds Expiratory wheezes;Inspiratory wheezes;Fine crackles;Rhonchi  R Upper  Breath Sounds Expiratory wheezes;Inspiratory wheezes;Rhonchi  L Upper Breath Sounds Expiratory wheezes;Inspiratory wheezes;Rhonchi  R Lower Breath Sounds Fine crackles  L Lower Breath Sounds Fine crackles    RT at bedside to assist with neb tx.

## 2023-01-02 NOTE — Progress Notes (Signed)
Pt taken to rm 219 in bed by RN. Pt's personal effects brought in bed, including cell phone and dentures (upper and lower). Chart and meds delivered to RN at RN station.

## 2023-01-02 NOTE — Progress Notes (Addendum)
Patient: Devin Washington    OYD:[id number]  DOA: 12/21/2022     Date of Service: the patient was seen and examined on 01/02/2023  PCCM asked to see by NURSING  FOR shock ON PRESSORS    89 y.o. male with medical history significant of atrial fibrillation, essential hypertension, chronic kidney disease stage IIIb, iron deficiency anemia, sick sinus syndrome status post pacemaker placement, hyperlipidemia, essential hypertension, chronic diastolic heart failure who was initially brought into the ER as a Devin Washington.  Patient was obtained there was significant hypoxic respiratory failure.  He is currently unable to give any history and is on BiPAP.  Patient arrived with oxygen sat of 62% on room air.  He was in respiratory distress on CPAP from EMS.  Workup so far showed severe sepsis with lactic acid of more than 9 and pH of 7.18.  Vitals are however stable.  Patient has chest x-ray that is negative.  Acute viral screen also negative.  No obvious source of sepsis.  At this point he is being admitted with acute hypoxic respiratory failure as well as severe sepsis of unknown source.  Workup is ongoing to determine the source.    Advance goals of care discussion: DNR/DNI  Family Communication BY TRH: family was not present at bedside, at the time of interview.  The pt provided permission to discuss medical plan with the family. Opportunity was given to ask question and all questions were answered satisfactorily.  1/15 called patient's granddaughter and left VM 1/16 discussed with patient's granddaughter over the phone, all question and concerns answered.  Events 1/18 Palliative care consulted, overall prognosis is very poor, patient may qualify for hospice if no improvement in the renal failure. 1/19 SEVERE SHOCK, PROGNOSIS IS VERY POOR   Subjective:  ALERT AND AWAKE SENILE +SOB, WANTS TO EAT ON PRESSORS        Review of Systems: Gen:  Denies  fever, sweats, chills weight loss  HEENT:  Denies blurred vision, double vision, ear pain, eye pain, hearing loss, nose bleeds, sore throat Cardiac:  No dizziness, chest pain or heaviness, chest tightness,edema, No JVD Resp:   No cough, -sputum production, -shortness of breath,-wheezing, -hemoptysis,  Other:  All other systems negative    Physical Examination:   General Appearance: No distress  EYES PERRLA, EOM intact.   NECK Supple, No JVD Pulmonary: normal breath sounds, No wheezing.  CardiovascularNormal S1,S2.  No m/r/g.   Abdomen: Benign, Soft, non-tender. ALL OTHER ROS ARE NEGATIVE   Vitals:   01/02/23 0430 01/02/23 0500 01/02/23 0530 01/02/23 0630  BP: (!) 129/42 (!) 136/50 (!) 145/47 (!) 133/43  Pulse:      Resp: '17 16 17 16  '$ Temp:      TempSrc:      SpO2:      Weight:      Height:        Intake/Output Summary (Last 24 hours) at 01/02/2023 0745 Last data filed at 01/02/2023 0434 Gross per 24 hour  Intake 2579.62 ml  Output 500 ml  Net 2079.62 ml    Filed Weights   12/22/22 0019 12/31/22 1550  Weight: 68.9 kg 79.2 kg    CBC: Recent Labs  Lab 12/29/22 0308 12/30/22 0502 12/31/22 0502 01/01/23 0508 01/02/23 0440  WBC 15.3* 12.9* 16.3* 15.6* 17.4*  HGB 7.9* 8.1* 8.2* 7.6* 8.1*  HCT 23.6* 25.0* 25.1* 22.9* 24.7*  MCV 88.4 89.3 90.0 89.1 89.5  PLT 220 269 304 293 352    Basic  Metabolic Panel: Recent Labs  Lab 12/29/22 0308 12/30/22 0502 12/31/22 0502 01/01/23 0508 01/01/23 1604 01/02/23 0440  NA 135 135 135 137 134* 135  K 3.8 4.0 4.4 4.5 5.3* 5.2*  CL 108 106 108 105 104 103  CO2 21* 20* 15* 18* 18* 20*  GLUCOSE 120* 115* 115* 117* 125* 128*  BUN 70* 75* 78* 83* 83* 87*  CREATININE 3.69* 3.97* 4.28* 4.78* 4.95* 5.21*  CALCIUM 8.0* 8.3* 8.4* 8.3* 8.4* 8.3*  MG 1.9 2.0 2.0 2.0  --   --   PHOS  --  4.9* 5.2* 6.5*  --   --      Studies: No results found.  Scheduled Meds:  apixaban  2.5 mg Oral BID   vitamin C  500 mg Oral Daily   aspirin EC  81 mg Oral Daily   atorvastatin  20  mg Oral Daily   Chlorhexidine Gluconate Cloth  6 each Topical Q0600   fluticasone furoate-vilanterol  1 puff Inhalation Daily   guaiFENesin  600 mg Oral BID   iron polysaccharides  150 mg Oral Daily   pantoprazole  40 mg Oral BID AC   traZODone  25 mg Oral QHS   Vitamin D (Ergocalciferol)  50,000 Units Oral Q7 days   Continuous Infusions:  sodium chloride     sodium chloride 10 mL/hr at 01/02/23 0434   norepinephrine (LEVOPHED) Adult infusion 9 mcg/min (01/02/23 0434)   sodium bicarbonate 75 mEq in sodium chloride 0.45 % 1,075 mL infusion 30 mL/hr at 01/02/23 0434   PRN Meds: acetaminophen **OR** acetaminophen, albuterol, chlorpheniramine-HYDROcodone, ipratropium-albuterol, ondansetron **OR** ondansetron (ZOFRAN) IV, mouth rinse, simethicone   Assessment and Plan:  89 yo AAM with progressive renal failure with shock, likely combination of sepsis  POA with hypovolemia,cardiogenic with LLL pneumonia  SHOCK SOURCE-SEPSIS volume, cardiogenic -use vasopressors to keep MAP>65 as needed -follow ABG and LA as needed -follow up cultures -emperic ABX -consider stress dose steroids -aggressive IV fluid Resuscitation   ACUTE KIDNEY INJURY/Renal Failure -continue Foley Catheter-assess need -Avoid nephrotoxic agents -Follow urine output, BMP -Ensure adequate renal perfusion, optimize oxygenation -Renal dose medications Recommend HOSPICE   Intake/Output Summary (Last 24 hours) at 01/02/2023 0756 Last data filed at 01/02/2023 0434 Gross per 24 hour  Intake 2579.62 ml  Output 500 ml  Net 7517.00 ml     # Metabolic acidosis due to AKI/SHOCK CO2 81--15--21--17 --20 S/p Sodium bicarbonate IV push given,  and s/p sodium bicarbonate IV infusion till 1/12, transition to oral bicarbonate 1/17 started bicarbonate IV infusion due to metabolic acidosis.   Monitor BMP daily  INFECTIOUS DISEASE -continue antibiotics as prescribed -follow up cultures Urinalysis was not very impressive,  urine culture multiple species, contamination Continue cefepime  (d/c'd Vanc & Flagyl) S/p vancomycin DC'd on 1/9, MRSA PCR negative, blood culture negative, WBC improving.  Procalcitonin 22 elevated at admission 1/12 CXR: Stable cardiac enlargement and chronic lung disease. No acute findings. 1/12 discontinued Flagyl, started empiric azithromycin 500 mg p.o. daily for 5 days Trend WBC count 1/15 CXR: Increased airspace disease at the left base may be due to atelectasis or pneumonia. Possible small left effusion.  ACUTE SYSTOLIC CARDIAC FAILURE- EF 40% -oxygen as needed -Lasix as tolerated -follow up cardiac enzymes as indicated  Right lower extremity DVT Duplex shows DVT in the posterior tibial vein Vascular surgery consulted, recommended Eliquis 2.5 milligram p.o. 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  Recommend Eliquis 2.5 mg  twice daily for 3 months. got opinion via secure chat, no official consult done.     COPD exacerbation   Elevated troponin, type II MI most likely due to demand ischemia Troponin 189----476  continue to trend Continue to monitor on telemetry TTE shows LVEF 40 to 41%, grade 1 diastolic dysfunction, LV demonstrate regional wall motion abnormality EKG shows paced rhythm Patient remained chest pain-free Cardiology consulted, recommended no ischemic cardiac workup at this stage Started aspirin       DVT/GI PRX  assessed I Assessed the need for Labs I Assessed the need for Foley I Assessed the need for Central Venous Line Family Discussion when available I Assessed the need for Mobilization I made an Assessment of medications to be adjusted accordingly Safety Risk assessment completed  CASE DISCUSSED IN MULTIDISCIPLINARY ROUNDS WITH ICU TEAM     Critical Care Time devoted to patient care services described in this note is 65 minutes.  Critical care was necessary  to treat /prevent imminent and life-threatening deterioration. Overall, patient is critically ill, prognosis is guarded.  Patient with Multiorgan failure and at high risk for cardiac arrest and death.   Recommend Hospice  Kumiko Fishman Patricia Pesa, M.D.  [facility]  Medical Director [place] Director [facility] Cardio-Pulmonary Department

## 2023-01-02 NOTE — Progress Notes (Signed)
[facility]    SUBJECTIVE: Resting comfortably in bed denies any pain still has some mild weakness no palpitations or tachycardia   Vitals:   01/02/23 0430 01/02/23 0500 01/02/23 0530 01/02/23 0630  BP: (!) 129/42 (!) 136/50 (!) 145/47 (!) 133/43  Pulse:      Resp: '17 16 17 16  '$ Temp:      TempSrc:      SpO2:      Weight:      Height:         Intake/Output Summary (Last 24 hours) at 01/02/2023 0654 Last data filed at 01/02/2023 0434 Gross per 24 hour  Intake 2579.62 ml  Output 500 ml  Net 2079.62 ml      PHYSICAL EXAM  General: Well developed, well nourished, in no acute distress HEENT:  Normocephalic and atramatic Neck:  No JVD.  Lungs: Clear bilaterally to auscultation and percussion. Heart: HRRR . Normal S1 and S2 without gallops or murmurs.  Abdomen: Bowel sounds are positive, abdomen soft and non-tender  Msk:  Back normal, normal gait. Normal strength and tone for age. Extremities: No clubbing, cyanosis or edema.   Neuro: Alert and oriented X 3. Psych:  Good affect, responds appropriately   LABS: Basic Metabolic Panel: Recent Labs    12/31/22 0502 01/01/23 0508 01/01/23 1604 01/02/23 0440  NA 135 137 134* 135  K 4.4 4.5 5.3* 5.2*  CL 108 105 104 103  CO2 15* 18* 18* 20*  GLUCOSE 115* 117* 125* 128*  BUN 78* 83* 83* 87*  CREATININE 4.28* 4.78* 4.95* 5.21*  CALCIUM 8.4* 8.3* 8.4* 8.3*  MG 2.0 2.0  --   --   PHOS 5.2* 6.5*  --   --    Liver Function Tests: Recent Labs    01/01/23 0508  ALBUMIN 2.2*   No results for input(s): "LIPASE", "AMYLASE" in the last 72 hours. CBC: Recent Labs    01/01/23 0508 01/02/23 0440  WBC 15.6* 17.4*  HGB 7.6* 8.1*  HCT 22.9* 24.7*  MCV 89.1 89.5  PLT 293 352   Cardiac Enzymes: No results for input(s): "CKTOTAL", "CKMB", "CKMBINDEX", "TROPONINI" in the last 72 hours. BNP: Invalid input(s): "POCBNP" D-Dimer: No results for input(s): "DDIMER" in the last 72 hours. Hemoglobin A1C: No results for  input(s): "HGBA1C" in the last 72 hours. Fasting Lipid Panel: No results for input(s): "CHOL", "HDL", "LDLCALC", "TRIG", "CHOLHDL", "LDLDIRECT" in the last 72 hours. Thyroid Function Tests: No results for input(s): "TSH", "T4TOTAL", "T3FREE", "THYROIDAB" in the last 72 hours.  Invalid input(s): "FREET3" Anemia Panel: No results for input(s): "VITAMINB12", "FOLATE", "FERRITIN", "TIBC", "IRON", "RETICCTPCT" in the last 72 hours.  No results found.   Echo mildly depressed left ventricular function ejection fraction around 40 to 45%  TELEMETRY: Paced rhythm 66:  ASSESSMENT AND PLAN:  Principal Problem:   Severe sepsis with acute organ dysfunction (HCC) Active Problems:   Acute on chronic diastolic (congestive) heart failure (HCC)   Presence of cardiac pacemaker   Sick sinus syndrome (HCC)   Chronic kidney disease, stage 3b (HCC)   Paroxysmal atrial fibrillation (HCC)   Iron deficiency anemia, unspecified   Hyperlipidemia    Plan Sepsis endorgan disease now with renal insufficiency continue current therapy continue modest hydration Acute on chronic renal insufficiency continue to follow renal function with nephrology's input Sick sinus syndrome permanent pacemaker in place appears to be functioning adequately with the Paroxysmal atrial fibrillation poor anticoagulation candidate Hyperlipidemia recommend lipid management with statin therapy Continue conservative support  but no invasive procedures or recommended order   Yolonda Kida, MD 01/02/2023 6:54 AM

## 2023-01-02 NOTE — Progress Notes (Signed)
Consulted to assess L ant FA I.V. infusing Levo. Due to swelling in both arms and existing swelling prior to I.V. being placed and no alarms from iwatch unable to determine if existing I.V. is problematic. New I.V. has been started in R ant lat FA to infuse Levo. Please consider central access.

## 2023-01-02 NOTE — TOC Progression Note (Signed)
Transition of Care [facility]) - Progression Note    Patient Details  Name: Devin Washington MRN: [id number] Date of Birth: 1923-05-14  [facility] CM/SW Contact  Shelbie Hutching, RN Phone Number: 01/02/2023, 4:49 PM  Clinical Narrative:    Hospice home referral given to Jhonnie Garner with [facility].    Expected Discharge Plan: [place] Barriers to Discharge: Continued Medical Work up  Expected Discharge Plan and Services                                               Social Determinants of Health (SDOH) Interventions SDOH Screenings   Food Insecurity: No Food Insecurity (12/22/2022)  Housing: Low Risk  (12/22/2022)  Transportation Needs: No Transportation Needs (12/22/2022)  Utilities: Not At Risk (12/22/2022)  Tobacco Use: Medium Risk (12/22/2022)    Readmission Risk Interventions     No data to display

## 2023-01-02 NOTE — Progress Notes (Signed)
IV Team was consulted regarding PIV site where Levo is running. The concern was for infiltration versus third spacing.   On assessment patient denies any pain, line was flushed without concern, but no blood return was noted.    See new PIV site, IV watch in place. Will monitor closely.

## 2023-01-02 NOTE — Progress Notes (Signed)
[facility]  ROUNDING NOTE   Subjective:   Patient seen and evaluated at bedside in ICU Alert and oriented Complaining of shortness of breath Receiving IVF bolus Remains on 4L [place] Lower extremity edema remains  Objective:  Vital signs in last 24 hours:  Temp:  [97.9 F (36.6 C)] 97.9 F (36.6 C) (01/18 1400) Pulse Rate:  [58-118] 79 (01/19 0300) Resp:  [12-22] 20 (01/19 0930) BP: (114-163)/(32-106) 132/48 (01/19 0930) SpO2:  [90 %-100 %] 100 % (01/19 0300)  Weight change:  Filed Weights   12/22/22 0019 12/31/22 1550  Weight: 68.9 kg 79.2 kg    Intake/Output: I/O last 3 completed shifts: In: 3182.4 [P.O.:670; I.V.:2512.4] Out: 500 [Urine:500]   Intake/Output this shift:  No intake/output data recorded.  Physical Exam: General: NAD  Head: Normocephalic, atraumatic. Moist oral mucosal membranes  Eyes: Anicteric  Lungs:  Wheeze, tachypnea, room air  Heart: Regular rate and rhythm  Abdomen:  Soft, nontender  Extremities: 3+ peripheral edema.  Neurologic: Alert and oriented, moving all four extremities  Skin: No lesions  Access: None    Basic Metabolic Panel: Recent Labs  Lab 12/29/22 0308 12/30/22 0502 12/31/22 0502 01/01/23 0508 01/01/23 1604 01/02/23 0440  NA 135 135 135 137 134* 135  K 3.8 4.0 4.4 4.5 5.3* 5.2*  CL 108 106 108 105 104 103  CO2 21* 20* 15* 18* 18* 20*  GLUCOSE 120* 115* 115* 117* 125* 128*  BUN 70* 75* 78* 83* 83* 87*  CREATININE 3.69* 3.97* 4.28* 4.78* 4.95* 5.21*  CALCIUM 8.0* 8.3* 8.4* 8.3* 8.4* 8.3*  MG 1.9 2.0 2.0 2.0  --   --   PHOS  --  4.9* 5.2* 6.5*  --   --      Liver Function Tests: Recent Labs  Lab 01/01/23 0508  ALBUMIN 2.2*   No results for input(s): "LIPASE", "AMYLASE" in the last 168 hours. No results for input(s): "AMMONIA" in the last 168 hours.  CBC: Recent Labs  Lab 12/29/22 0308 12/30/22 0502 12/31/22 0502 01/01/23 0508 01/02/23 0440  WBC 15.3* 12.9* 16.3* 15.6* 17.4*  HGB 7.9*  8.1* 8.2* 7.6* 8.1*  HCT 23.6* 25.0* 25.1* 22.9* 24.7*  MCV 88.4 89.3 90.0 89.1 89.5  PLT 220 269 304 293 352     Cardiac Enzymes: No results for input(s): "CKTOTAL", "CKMB", "CKMBINDEX", "TROPONINI" in the last 168 hours.  BNP: Invalid input(s): "POCBNP"  CBG: Recent Labs  Lab 01/01/23 1626 01/01/23 1923 01/01/23 2326 01/02/23 0335 01/02/23 0741  GLUCAP 120* 167* 122* 110* 127*     Microbiology: Results for orders placed or performed during the hospital encounter of 12/21/22  Resp panel by RT-PCR (RSV, Flu A&B, Covid) Anterior Nasal Swab     Status: None   Collection Time: 12/21/22  6:54 PM   Specimen: Anterior Nasal Swab  Result Value Ref Range Status   SARS Coronavirus 2 by RT PCR NEGATIVE NEGATIVE Final    Comment: (NOTE) SARS-CoV-2 target nucleic acids are NOT DETECTED.  The SARS-CoV-2 RNA is generally detectable in upper respiratory specimens during the acute phase of infection. The lowest concentration of SARS-CoV-2 viral copies this assay can detect is 138 copies/mL. A negative result does not preclude SARS-Cov-2 infection and should not be used as the sole basis for treatment or other patient management decisions. A negative result may occur with  improper specimen collection/handling, submission of specimen other than nasopharyngeal swab, presence of viral mutation(s) within the areas targeted by this assay, and inadequate number  of viral copies(<138 copies/mL). A negative result must be combined with clinical observations, patient history, and epidemiological information. The expected result is Negative.  Fact Sheet for Patients:  EntrepreneurPulse.com.au  Fact Sheet for Healthcare Providers:  IncredibleEmployment.be  This test is no t yet approved or cleared by the [place] FDA and  has been authorized for detection and/or diagnosis of SARS-CoV-2 by FDA under an Emergency Use Authorization (EUA). This EUA will  remain  in effect (meaning this test can be used) for the duration of the COVID-19 declaration under Section 564(b)(1) of the Act, 21 U.S.C.section 360bbb-3(b)(1), unless the authorization is terminated  or revoked sooner.       Influenza A by PCR NEGATIVE NEGATIVE Final   Influenza B by PCR NEGATIVE NEGATIVE Final    Comment: (NOTE) The Xpert Xpress SARS-CoV-2/FLU/RSV plus assay is intended as an aid in the diagnosis of influenza from Nasopharyngeal swab specimens and should not be used as a sole basis for treatment. Nasal washings and aspirates are unacceptable for Xpert Xpress SARS-CoV-2/FLU/RSV testing.  Fact Sheet for Patients: EntrepreneurPulse.com.au  Fact Sheet for Healthcare Providers: IncredibleEmployment.be  This test is not yet approved or cleared by the [place] FDA and has been authorized for detection and/or diagnosis of SARS-CoV-2 by FDA under an Emergency Use Authorization (EUA). This EUA will remain in effect (meaning this test can be used) for the duration of the COVID-19 declaration under Section 564(b)(1) of the Act, 21 U.S.C. section 360bbb-3(b)(1), unless the authorization is terminated or revoked.     Resp Syncytial Virus by PCR NEGATIVE NEGATIVE Final    Comment: (NOTE) Fact Sheet for Patients: EntrepreneurPulse.com.au  Fact Sheet for Healthcare Providers: IncredibleEmployment.be  This test is not yet approved or cleared by the [place] FDA and has been authorized for detection and/or diagnosis of SARS-CoV-2 by FDA under an Emergency Use Authorization (EUA). This EUA will remain in effect (meaning this test can be used) for the duration of the COVID-19 declaration under Section 564(b)(1) of the Act, 21 U.S.C. section 360bbb-3(b)(1), unless the authorization is terminated or revoked.  Performed at [facility], [address] [place], [place] [address]    Blood Culture (routine x 2)     Status: None   Collection Time: 12/21/22  6:54 PM   Specimen: BLOOD  Result Value Ref Range Status   Specimen Description BLOOD BLOOD RIGHT FOREARM  Final   Special Requests   Final    BOTTLES DRAWN AEROBIC AND ANAEROBIC Blood Culture adequate volume   Culture   Final    NO GROWTH 5 DAYS Performed at [facility], [address] [place], [place] [address]    Report Status 12/26/2022 FINAL  Final  Blood Culture (routine x 2)     Status: None   Collection Time: 12/21/22  6:59 PM   Specimen: BLOOD  Result Value Ref Range Status   Specimen Description BLOOD BLOOD LEFT FOREARM  Final   Special Requests   Final    BOTTLES DRAWN AEROBIC AND ANAEROBIC Blood Culture adequate volume   Culture   Final    NO GROWTH 5 DAYS Performed at [facility], [address], [place], [place] [address]    Report Status 12/26/2022 FINAL  Final  Urine Culture     Status: Abnormal   Collection Time: 12/22/22 12:12 AM   Specimen: In/Out Cath Urine  Result Value Ref Range Status   Specimen Description   Final    IN/OUT CATH URINE  Performed at [facility], [address] [place], [place] [address]    Special Requests   Final    NONE Performed at [facility], [address] [place], [place] [address]    Culture MULTIPLE SPECIES PRESENT, SUGGEST RECOLLECTION (A)  Final   Report Status 12/23/2022 FINAL  Final  MRSA Next Gen by PCR, Nasal     Status: None   Collection Time: 12/22/22 10:30 PM   Specimen: Nasal Mucosa; Nasal Swab  Result Value Ref Range Status   MRSA by PCR Next Gen NOT DETECTED NOT DETECTED Final    Comment: (NOTE) The GeneXpert MRSA Assay (FDA approved for NASAL specimens only), is one component of a comprehensive MRSA colonization surveillance program. It is not intended to diagnose MRSA infection nor to guide or monitor treatment for MRSA infections. Test performance is not FDA approved in patients less  than 45 years old. Performed at [facility], [address] [place], [place] [address]     Coagulation Studies: No results for input(s): "LABPROT", "INR" in the last 72 hours.  Urinalysis: No results for input(s): "COLORURINE", "LABSPEC", "PHURINE", "GLUCOSEU", "HGBUR", "BILIRUBINUR", "KETONESUR", "PROTEINUR", "UROBILINOGEN", "NITRITE", "LEUKOCYTESUR" in the last 72 hours.  Invalid input(s): "APPERANCEUR"    Imaging: No results found.   Medications:    sodium chloride     sodium chloride 10 mL/hr at 01/02/23 0434   azithromycin     norepinephrine (LEVOPHED) Adult infusion 6 mcg/min (01/02/23 0937)   sodium bicarbonate 75 mEq in sodium chloride 0.45 % 1,075 mL infusion 30 mL/hr at 01/02/23 0434    apixaban  2.5 mg Oral BID   vitamin C  500 mg Oral Daily   aspirin EC  81 mg Oral Daily   atorvastatin  20 mg Oral Daily   Chlorhexidine Gluconate Cloth  6 each Topical Q0600   fluticasone furoate-vilanterol  1 puff Inhalation Daily   guaiFENesin  600 mg Oral BID   hydrocortisone sod succinate (SOLU-CORTEF) inj  100 mg Intravenous Q8H   iron polysaccharides  150 mg Oral Daily   pantoprazole  40 mg Oral BID AC   traZODone  25 mg Oral QHS   Vitamin D (Ergocalciferol)  50,000 Units Oral Q7 days   acetaminophen **OR** acetaminophen, albuterol, chlorpheniramine-HYDROcodone, ipratropium-albuterol, ondansetron **OR** ondansetron (ZOFRAN) IV, mouth rinse, simethicone  Assessment/ Plan:  [name] is a 89 y.o.  male  with past medical conditions of a fib, anemia, sick sinus syndrome, chronic diastolic heart failure, hyperlipidemia, hypertension, and chronic kidney disease, who was admitted to [facility] on 12/21/2022 for Acute respiratory failure with hypoxia ([place]) [J96.01] AKI (acute kidney injury) ([place]) [N17.9] Septic shock ([place]) [A41.9, R65.21] Severe sepsis with acute organ dysfunction ([place]) [A41.9, R65.20]   Acute kidney injury on chronic kidney disease stage IV.  Baseline creatinine 2.42 with GFR 24 in February 2023. Acute kidney injury likely secondary to hypotension and severe illness. Creatinine on admission 3.75 with initial improvement to 2.36. No IV contrast exposure during this admission.  Creatinine continues to worsen with suboptima urine output. Due to cardiac history, patient would be a poor candidate for dialysis. Recommend palliative care consult to determine goals of care.   Lab Results  Component Value Date   CREATININE 5.21 (H) 01/02/2023   CREATININE 4.95 (H) 01/01/2023   CREATININE 4.78 (H) 01/01/2023    Intake/Output Summary (Last 24 hours) at 01/02/2023 0953 Last data filed at 01/02/2023 0434 Gross per 24 hour  Intake 2126.26 ml  Output  500 ml  Net 1626.26 ml    2. Anemia of chronic kidney disease Lab Results  Component Value Date   HGB 8.1 (L) 01/02/2023    Hgb below target but has improved. Will continue to monitor.   3. Hypotension due to sepsis. All antihypertensives held. Receiving midodrine for BP support.  Remains on Levo, attempting to wean.    4.  Acute metabolic acidosis.  Serum bicarb 10 on admission. Remains on sodium bicarb infusion. Correcting to 20 today. Continue sodium bicarb 30 mL/h.     LOS: [place] 1/19/20249:53 AM

## 2023-01-02 NOTE — Plan of Care (Signed)
No escalation of care Patient does NOT want HD Will not add pressors  Continue medical management, no aggressive care  PCCM top sign off

## 2023-01-02 NOTE — Consult Note (Signed)
Pharmacy Antibiotic Note  Devin Washington is a 89 y.o. male admitted on 12/21/2022 with sepsis. PMH significant for AFib, HTN, CKD3b, sick sinus syndrome s/p pacemaker placement, HLD, dCHF. With new leukocytosis after completion of course of cefepime and azithromycin. Pharmacy has been consulted for Zosyn dosing.  Plan: Day 1 of antibiotics Start Zosyn 2.25 g IV Q8H Continue to monitor renal function and follow culture results   Height: '5\' 10"'$  (177.8 cm) Weight: 79.2 kg (174 lb 9.7 oz) IBW/kg (Calculated) : 73  Temp (24hrs), Avg:98.2 F (36.8 C), Min:97.9 F (36.6 C), Max:98.4 F (36.9 C)  Recent Labs  Lab 12/29/22 0308 12/30/22 0502 12/31/22 0502 01/01/23 0508 01/01/23 1604 01/02/23 0440  WBC 15.3* 12.9* 16.3* 15.6*  --  17.4*  CREATININE 3.69* 3.97* 4.28* 4.78* 4.95* 5.21*    Estimated Creatinine Clearance: 8.4 mL/min (A) (by C-G formula based on SCr of 5.21 mg/dL (H)).    No Known Allergies  Antimicrobials this admission: 1/7 Azithromycin x1 1/8 Cefepime >> 1/15 1/8 Metronidazole >> 1/12 1/12 Azithromycin >>1/16 1/19 Zosyn >>  Dose adjustments this admission: N/A  Microbiology results: 1/8 UCx: multiple species present  1/8 MRSA PCR: negative  Thank you for allowing pharmacy to be a part of this patient's care.  [place] 01/02/2023 1:07 PM

## 2023-01-02 NOTE — Consult Note (Addendum)
Consultation Note Date: 01/02/2023   Patient Name: Devin Washington  DOB: 1923/11/11  MRN: [id number]  Age / Sex: 89 y.o., male  PCP: Pcp, No Referring Physician: Val Riles, MD  Reason for Consultation: Establishing goals of care  HPI/Patient Profile: Devin Washington is a 89 y.o. male with medical history significant of atrial fibrillation, essential hypertension, chronic kidney disease stage IIIb, iron deficiency anemia, sick sinus syndrome status post pacemaker placement, hyperlipidemia, essential hypertension, chronic diastolic heart failure who was initially brought into the ER as a Devin Washington.  Patient was obtained there was significant hypoxic respiratory failure.  He is currently unable to give any history and is on BiPAP.  Patient arrived with oxygen sat of 62% on room air.  He was in respiratory distress on CPAP from EMS.  Workup so far showed severe sepsis with lactic acid of more than 9 and pH of 7.18.  Vitals are however stable.  Patient has chest x-ray that is negative.  Acute viral screen also negative.  No obvious source of sepsis.  At this point he is being admitted with acute hypoxic respiratory failure as well as severe sepsis of unknown source.  Workup is ongoing to determine the source.   Clinical Assessment and Goals of Care: Notes and labs reviewed.  In to speak with patient.  Patient states that he is retired from [employer].  He states he has a special needs daughter who resides at that facility.  He states he has 2 of 5 children living.  He states he is widowed as of around a year ago.  Patient has been living alone and at baseline has been driving, shopping, and caring for his home.   We discussed his diagnoses, prognosis, GOC, EOL wishes disposition and options.  Created space and opportunity for patient  to explore thoughts and feelings regarding current medical information.    A detailed discussion was had today regarding advanced directives.  Concepts specific to code status, artifical feeding and hydration, IV antibiotics and rehospitalization were discussed.  The difference between an aggressive medical intervention path and a comfort care path was discussed.  Values and goals of care important to patient and family were attempted to be elicited.  Discussed limitations of medical interventions to prolong quality of life in some situations and discussed the concept of human mortality.  Patient is clear that he does not want dialysis and wants comfort focused care.  Patient called his son Devin Washington from his cell phone and placed the phone on speaker phone.  Recapped conversation, they both are amenable to shifting to comfort care with hope for hospice facility placement.  Son states that he is snowed in currently at his home in [place] and will not be able to make it until around a week from now.  He understands that his father may die before this time.  Care team made aware of patient's wishes.  I completed a MOST form today and the signed original was placed in the chart. The form  was scanned and sent to medical records for it to be uploaded under ACP tab in Epic. A photocopy was also placed in the chart to be scanned into EMR. The patient outlined their wishes for the following treatment decisions:  Cardiopulmonary Resuscitation: Do Not Attempt Resuscitation (DNR/No CPR)  Medical Interventions: Comfort Measures: Keep clean, warm, and dry. Use medication by any route, positioning, wound care, and other measures to relieve pain and suffering. Use oxygen, suction and manual treatment of airway obstruction as needed for comfort. Do not transfer to the hospital unless comfort needs cannot be met in current location.  Antibiotics: No antibiotics (use other measures to relieve symptoms)  IV Fluids: No IV fluids (provide other measures to ensure comfort)  Feeding Tube: No feeding  tube      SUMMARY OF RECOMMENDATIONS   Patient would like to focus on comfort and dignity and would like to go to the hospice facility.  Care team made aware of conversation.   Prognosis:  < 2 weeks      Primary Diagnoses: Present on Admission:  Severe sepsis with acute organ dysfunction (HCC)  Acute on chronic diastolic (congestive) heart failure (HCC)  Chronic kidney disease, stage 3b (HCC)  Iron deficiency anemia, unspecified  Paroxysmal atrial fibrillation (HCC)  Presence of cardiac pacemaker  Sick sinus syndrome ([place])  Hyperlipidemia   I have reviewed the medical record, interviewed the patient and family, and examined the patient. The following aspects are pertinent.  History reviewed. No pertinent past medical history. Social History   Socioeconomic History   Marital status: Single    Spouse name: Not on file   Number of children: Not on file   Years of education: Not on file   Highest education level: Not on file  Occupational History   Not on file  Tobacco Use   Smoking status: Former    Types: Cigarettes   Smokeless tobacco: Never  Substance and Sexual Activity   Alcohol use: Not Currently   Drug use: Never   Sexual activity: Not Currently  Other Topics Concern   Not on file  Social History Narrative   Not on file   Social Determinants of Health   Financial Resource Strain: Not on file  Food Insecurity: No Food Insecurity (12/22/2022)   Hunger Vital Sign    Worried About Running Out of Food in the Last Year: Never true    Ran Out of Food in the Last Year: Never true  Transportation Needs: No Transportation Needs (12/22/2022)   PRAPARE - Hydrologist (Medical): No    Lack of Transportation (Non-Medical): No  Physical Activity: Not on file  Stress: Not on file  Social Connections: Not on file   History reviewed. No pertinent family history. Scheduled Meds:  apixaban  2.5 mg Oral BID   vitamin C  500 mg Oral Daily    aspirin EC  81 mg Oral Daily   atorvastatin  20 mg Oral Daily   Chlorhexidine Gluconate Cloth  6 each Topical Q0600   fluticasone furoate-vilanterol  1 puff Inhalation Daily   guaiFENesin  600 mg Oral BID   hydrocortisone sod succinate (SOLU-CORTEF) inj  100 mg Intravenous Q8H   iron polysaccharides  150 mg Oral Daily   midodrine  10 mg Oral TID WC   pantoprazole  40 mg Oral BID AC   traZODone  25 mg Oral QHS   Vitamin D (Ergocalciferol)  50,000 Units Oral Q7 days   Continuous  Infusions:  sodium chloride     sodium chloride 10 mL/hr at 01/02/23 0434   norepinephrine (LEVOPHED) Adult infusion Stopped (01/02/23 1153)   piperacillin-tazobactam (ZOSYN)  IV 2.25 g (01/02/23 1312)   sodium bicarbonate 75 mEq in sodium chloride 0.45 % 1,075 mL infusion 30 mL/hr at 01/02/23 0434   PRN Meds:.acetaminophen **OR** acetaminophen, albuterol, chlorpheniramine-HYDROcodone, ipratropium-albuterol, ondansetron **OR** ondansetron (ZOFRAN) IV, mouth rinse, simethicone Medications Prior to Admission:  Prior to Admission medications   Medication Sig Start Date End Date Taking? Authorizing Provider  amLODipine (NORVASC) 10 MG tablet Take 10 mg by mouth in the morning. 01/01/21  Yes [provider]  aspirin EC (ASPIR-LOW) 81 MG tablet 81 mg daily. 05/22/21  Yes [provider]  benazepril (LOTENSIN) 20 MG tablet Take 20 mg by mouth in the morning. 03/13/21  Yes [provider]  cyanocobalamin 1000 MCG tablet Take 1,000 mcg by mouth daily. 03/13/21  Yes [provider]  ferrous sulfate 325 (65 FE) MG EC tablet Take 325 mg by mouth 2 (two) times daily. 03/13/21  Yes [provider]  furosemide (LASIX) 40 MG tablet Take 40 mg by mouth in the morning. 05/22/21  Yes [provider]  metoprolol tartrate (LOPRESSOR) 25 MG tablet Take 25 mg by mouth 2 (two) times daily. 02/03/21  Yes [provider]  pantoprazole (PROTONIX) 40 MG tablet Take 40 mg by mouth 2 (two)  times daily. 01/28/21  Yes [provider]  simvastatin (ZOCOR) 40 MG tablet Take 40 mg by mouth at bedtime. 02/03/21  Yes [provider]  traMADol (ULTRAM) 50 MG tablet Take 50 mg by mouth 3 (three) times daily as needed. 12/16/22  Yes [provider]  traZODone (DESYREL) 50 MG tablet Take 50 mg by mouth at bedtime. 02/03/21  Yes [provider]   No Known Allergies Review of Systems  Respiratory:  Positive for shortness of breath.     Physical Exam Pulmonary:     Effort: Pulmonary effort is normal.  Neurological:     Mental Status: He is alert.     Vital Signs: BP (!) 115/49   Pulse 79   Temp 98.4 F (36.9 C) (Axillary)   Resp 19   Ht '5\' 10"'$  (1.778 m)   Wt 79.2 kg   SpO2 100%   BMI 25.05 kg/m  Pain Scale: 0-10 POSS *See Group Information*: 1-Acceptable,Awake and alert Pain Score: 0-No pain   SpO2: SpO2: 100 % O2 Device:SpO2: 100 % O2 Flow Rate: .O2 Flow Rate (L/min): 4 L/min  IO: Intake/output summary:  Intake/Output Summary (Last 24 hours) at 01/02/2023 1651 Last data filed at 01/02/2023 0434 Gross per 24 hour  Intake 1171.84 ml  Output 350 ml  Net 821.84 ml    LBM: Last BM Date : 12/28/22 Baseline Weight: Weight: 68.9 kg Most recent weight: Weight: 79.2 kg      Signed by: Asencion Gowda, NP   Please contact Palliative Medicine Team phone at [phone] for questions and concerns.  For individual provider: See Shea Evans

## 2023-01-02 NOTE — Progress Notes (Signed)
[facility] Progress Note  Patient: Devin Washington    HAL:[id number]  DOA: 12/21/2022     Date of Service: the patient was seen and examined on 01/02/2023  Chief Complaint  Patient presents with   Respiratory Distress   Brief hospital course:  Devin Washington is a 89 y.o. male with medical history significant of atrial fibrillation, essential hypertension, chronic kidney disease stage IIIb, iron deficiency anemia, sick sinus syndrome status post pacemaker placement, hyperlipidemia, essential hypertension, chronic diastolic heart failure who was initially brought into the ER as a Devin Washington.  Patient was obtained there was significant hypoxic respiratory failure.  He is currently unable to give any history and is on BiPAP.  Patient arrived with oxygen sat of 62% on room air.  He was in respiratory distress on CPAP from EMS.  Workup so far showed severe sepsis with lactic acid of more than 9 and pH of 7.18.  Vitals are however stable.  Patient has chest x-ray that is negative.  Acute viral screen also negative.  No obvious source of sepsis.  At this point he is being admitted with acute hypoxic respiratory failure as well as severe sepsis of unknown source.  Workup is ongoing to determine the source.    Assessment and Plan:  # AKI on Chronic kidney disease stage III: Hydrate.  Monitor renal function Creatinine 3.75--3.2--2.6--2.5---3.69 --3.97--4.95--5.21 Monitor renal foods daily, monitor urine output Avoid nephrotoxic medications, use renal dose medications 1/14 US renal:  No evidence of obstructive uropathy. Mildly increased renal echogenicity as can be seen in the setting of medical renal disease. Bilateral pleural effusions. 1/15 again renal functions are deteriorating most likely due to hypotension  Bladder scan negative for any urinary retention,  1/17 AKI most likely secondary to prerenal due to hypotension, Nephrology consulted, recommended pressure support.  S/p Doxidope  1/18  --1/19  s/p Levophed IV infusion 1/19 pulmonary critical care was consulted, Levophed was weaned off, and started midodrine 10 mg p.o. 3 times daily   # Metabolic acidosis due to AKI CO2 81--15--21--17 --20 S/p Sodium bicarbonate IV push given,  and s/p sodium bicarbonate IV infusion till 1/12, transition to oral bicarbonate 1/17 started bicarbonate IV infusion due to metabolic acidosis.   Monitor BMP daily   # Severe sepsis with endorgan damage: Patient presented with severe hypoxic respiratory failure, although no evidence of pneumonia on chest x-ray.   CT C/a/p: Peripheral reticulations with bibasilar subpleural cystic changes suggestive of interstitial lung disease/pulmonary fibrosis. Urinalysis was not very impressive, urine culture multiple species, contamination S/p cefepime  (d/c'd Vanc & Flagyl) S/p vancomycin DC'd on 1/9, MRSA PCR negative, blood culture negative, WBC improving.  Procalcitonin 22 elevated at admission 1/12 CXR: Stable cardiac enlargement and chronic lung disease. No acute findings. 1/12 discontinued Flagyl, started empiric azithromycin 500 mg p.o. daily for 5 days Trend WBC count 1/15 CXR: Increased airspace disease at the left base may be due to atelectasis or pneumonia. Possible small left effusion. 1/19 started Zosyn IV  Pulmonary critical care was consulted, started Solu-Medrol 80 g IV 8 hourly stress dose steroids   # Hypertension and paroxysmal atrial fibrillation:  Rate was controlled on Home regimen. 1/16 patient was hypotensive, discontinued amlodipine and metoprolol home meds. s/p midodrine 10 mg p.o. TID. 1/17 s/p Dxidope which was discontinued and blood pressure did not improve.  On 1/18 Started Levophed IV infusion for pressure support as per nephrology recommendation patient developed AKI due to low blood pressure Monitor BP and  titrate medications accordingly   Right lower extremity DVT Duplex shows DVT in the posterior tibial vein Vascular  surgery consulted, recommended Eliquis 2.5 milligram p.o. 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  Recommend Eliquis 2.5 mg twice daily for 3 months. got opinion via secure chat, no official consult done.  Follow-up outpatient    # Acute hypoxic respiratory failure with COPD exacerbation S/p BiPAP.  Continue antibiotics treatment and titrate of BiPAP S/p IV Solu-Medrol, discontinued steroids on 1/9 due to low hemoglobin, possible GI bleed.  Patient's respiratory failure resolved, currently saturating well on room air. Started Breo Ellipta inhaler, DuoNeb every 6 hourly scheduled followed by as needed Started Mucinex extremity gram p.o. twice daily and Tussionex for cough as needed   # Elevated troponin, type II MI most likely due to demand ischemia Troponin 189----476  continue to trend Continue to monitor on telemetry TTE shows LVEF 40 to 47%, grade 1 diastolic dysfunction, LV demonstrate regional wall motion abnormality EKG shows paced rhythm Patient remained chest pain-free Cardiology consulted, recommended no ischemic cardiac workup at this stage Started aspirin    # Anemia due to iron deficiency, monitor H&H Iron level 19, transferrin saturation 82% Folic acid 9.5 WNL Start oral iron supplement when stable, avoid IV iron due to active infection 1/9, Hb 6.4, transfuse 1 unit of PRBC 1/15 Hb 7.9 gradually dropping S/p Pantoprazole 40 mg IV BID, switch to p.o. Check FOBT   # Hypomagnesemia, mag repleted.  Resolved Monitor and replete as needed.   # severe lactic acidosis: Aggressively hydrate.  Continue antibiotics Lactic acidosis resolved, lactic acid 1.6 within normal range now. # sick sinus syndrome: Status post pacemaker placement.  Rate is now controlled.    # acute metabolic encephalopathy: Secondary to severe sepsis.  Resolved, patient is AOx3   # Pulmonary nodule, incidental  finding on CT scan CT chest: Lingular subpleural 1.3 x 1 cm nodule. Consider one of the following in 3 months for both low-risk and high-risk individuals: (a) repeat chest CT, (b) follow-up PET-CT, or (c) tissue sampling. Patient was recommended to follow with PCP and pulmonary as an outpatient  PAD, abdominal aortic aneurysm CT A/P: Stable aneurysmal bilateral common iliac arteries. 6. Stable aneurysmal infrarenal abdominal aorta (3.4 cm). Recommend annual imaging followup by CTA or MRA.  Patient was recommended to follow with PCP for incidental findings as above  Vitamin D insufficiency: started vitamin D 50,000 units p.o. weekly, follow with PCP to repeat vitamin D level after 3 to 6 months.  Body mass index is 21.81 kg/m.  Interventions:    PT and OT eval done, recommend SNF placement     Diet: Heart healthy diet DVT Prophylaxis: Subcutaneous Heparin    Advance goals of care discussion: DNR 1/19 discussed with patient's son over the phone, overall prognosis is poor, condition is deteriorating.  We will continue current treatment for 1-2 more days and if there is no improvement then we will make him comfort care and proceed with hospice placement.   Family Communication: family was not present at bedside, at the time of interview.  The pt provided permission to discuss medical plan with the family. Opportunity was given to ask question and all questions were answered satisfactorily.  1/15 called patient's granddaughter and left VM 1/16 discussed with patient's granddaughter over the phone, all question and concerns answered. 1/19 d/w patient's son as above  Disposition:  Pt is from Home, admitted with sepsis,  developed AKI, metabolic acidosis, and hypotension, still creatinine is elevated, started bicarb IV infusion and s/p Levophed IV infusion, which precludes a safe discharge. Discharge to SNF vs hospice care if no improvement in 1 to 2 days. 1/18 Palliative care  consulted   Subjective:  No significant overnight events, patient does not feel good, denies any specific complaints, has mild shortness of breath.  No any other active issues.  Physical Exam: General: lying comfortably, mild  SOB, acidotic breathing Appear in mild distress, affect seems depressed Eyes: PERRLA ENT: Oral Mucosa Clear, moist  Neck: no JVD,  Cardiovascular: S1 and S2 Present, no Murmur,  Respiratory: good respiratory effort, Bilateral Air entry equal and Decreased, mild Crackles and no significant wheezing appreciated Abdomen: Bowel Sound present, Soft and no tenderness,  Skin: no rashes Extremities: 2-3+ edema b/l LE, Left calf thrombosed chronic aneurysm Neurologic: without any new focal findings Gait not checked due to patient safety concerns  Vitals:   01/02/23 1012 01/02/23 1100 01/02/23 1154 01/02/23 1545  BP: (!) 109/49  (!) 117/47 (!) 115/49  Pulse:      Resp: '17  19 19  '$ Temp:  98.4 F (36.9 C)    TempSrc:  Axillary    SpO2:  100%    Weight:      Height:        Intake/Output Summary (Last 24 hours) at 01/02/2023 1602 Last data filed at 01/02/2023 0434 Gross per 24 hour  Intake 1171.84 ml  Output 350 ml  Net 821.84 ml   Filed Weights   12/22/22 0019 12/31/22 1550  Weight: 68.9 kg 79.2 kg    Data Reviewed: I have personally reviewed and interpreted daily labs, tele strips, imagings as discussed above. I reviewed all nursing notes, pharmacy notes, vitals, pertinent old records I have discussed plan of care as described above with RN and patient/family.  CBC: Recent Labs  Lab 12/29/22 0308 12/30/22 0502 12/31/22 0502 01/01/23 0508 01/02/23 0440  WBC 15.3* 12.9* 16.3* 15.6* 17.4*  HGB 7.9* 8.1* 8.2* 7.6* 8.1*  HCT 23.6* 25.0* 25.1* 22.9* 24.7*  MCV 88.4 89.3 90.0 89.1 89.5  PLT 220 269 304 293 865   Basic Metabolic Panel: Recent Labs  Lab 12/29/22 0308 12/30/22 0502 12/31/22 0502 01/01/23 0508 01/01/23 1604 01/02/23 0440  NA 135  135 135 137 134* 135  K 3.8 4.0 4.4 4.5 5.3* 5.2*  CL 108 106 108 105 104 103  CO2 21* 20* 15* 18* 18* 20*  GLUCOSE 120* 115* 115* 117* 125* 128*  BUN 70* 75* 78* 83* 83* 87*  CREATININE 3.69* 3.97* 4.28* 4.78* 4.95* 5.21*  CALCIUM 8.0* 8.3* 8.4* 8.3* 8.4* 8.3*  MG 1.9 2.0 2.0 2.0  --   --   PHOS  --  4.9* 5.2* 6.5*  --   --     Studies: No results found.  Scheduled Meds:  apixaban  2.5 mg Oral BID   vitamin C  500 mg Oral Daily   aspirin EC  81 mg Oral Daily   atorvastatin  20 mg Oral Daily   Chlorhexidine Gluconate Cloth  6 each Topical Q0600   fluticasone furoate-vilanterol  1 puff Inhalation Daily   guaiFENesin  600 mg Oral BID   hydrocortisone sod succinate (SOLU-CORTEF) inj  100 mg Intravenous Q8H   iron polysaccharides  150 mg Oral Daily   midodrine  10 mg Oral TID WC   pantoprazole  40 mg Oral BID AC   traZODone  25 mg Oral QHS  Vitamin D (Ergocalciferol)  50,000 Units Oral Q7 days   Continuous Infusions:  sodium chloride     sodium chloride 10 mL/hr at 01/02/23 0434   norepinephrine (LEVOPHED) Adult infusion Stopped (01/02/23 1153)   piperacillin-tazobactam (ZOSYN)  IV 2.25 g (01/02/23 1312)   sodium bicarbonate 75 mEq in sodium chloride 0.45 % 1,075 mL infusion 30 mL/hr at 01/02/23 0434   PRN Meds: acetaminophen **OR** acetaminophen, albuterol, chlorpheniramine-HYDROcodone, ipratropium-albuterol, ondansetron **OR** ondansetron (ZOFRAN) IV, mouth rinse, simethicone  Time spent: 55 minutes  Author: Val Riles. MD [facility] 01/02/2023 4:02 PM  To reach On-call, see care teams to locate the attending and reach out to them via [email or web address]. If 7PM-7AM, please contact night-coverage If you still have difficulty reaching the attending provider, please page the [facility] (Director on Call) for [facility] on amion for assistance.

## 2023-01-02 NOTE — Progress Notes (Signed)
An USGPIV (ultrasound guided PIV) has been placed for short-term vasopressor infusion. A correctly placed ivWatch must be used when administering Vasopressors. Should this treatment be needed beyond 72 hours, central line access should be obtained.  It will be the responsibility of the bedside nurse to follow best practice to prevent extravasations.

## 2023-01-02 NOTE — Progress Notes (Signed)
PT Cancellation Note  Patient Details Name: Devin Washington MRN: [id number] DOB: 10/14/1923   Cancelled Treatment:    Reason Eval/Treat Not Completed: Other (comment). Per EMR palliative consult placed in setting of worsening renal failure to discuss [place]. PT to hold until consult completed to better understand pt's goals for his care as pt may possibly be placed on hospice care if renal failure worsens. PT to continue to follow and intervene as medically appropriate.    Salem Caster. Fairly IV, PT, DPT Physical Therapist- [facility]  01/02/2023, 12:44 PM

## 2023-01-03 DIAGNOSIS — A419 Sepsis, unspecified organism: Secondary | ICD-10-CM | POA: Diagnosis not present

## 2023-01-03 DIAGNOSIS — R6520 Severe sepsis without septic shock: Secondary | ICD-10-CM | POA: Diagnosis not present

## 2023-01-03 NOTE — TOC Progression Note (Signed)
Transition of Care [facility]) - Progression Note    Patient Details  Name: Devin Washington MRN: [id number] Date of Birth: September 27, 1923  Transition of Care [facility]) CM/SW [place], LCSW Phone Number: 01/03/2023, 8:30 AM  Clinical Narrative:    CSW reached out to [facility] to request update on hospice home referral status.    Expected Discharge Plan: [place] Barriers to Discharge: Continued Medical Work up  Expected Discharge Plan and Services                                               Social Determinants of Health (SDOH) Interventions SDOH Screenings   Food Insecurity: No Food Insecurity (12/22/2022)  Housing: Low Risk  (12/22/2022)  Transportation Needs: No Transportation Needs (12/22/2022)  Utilities: Not At Risk (12/22/2022)  Tobacco Use: Medium Risk (12/22/2022)    Readmission Risk Interventions     No data to display

## 2023-01-03 NOTE — Progress Notes (Signed)
[facility]  SUBJECTIVE: Patient laying in bed, denies chest pain   Vitals:   01/02/23 2000 01/02/23 2100 01/02/23 2149 01/03/23 0849  BP: (!) 119/48 (!) 117/55 (!) 106/51 110/61  Pulse:   63 64  Resp: '18 15 12 14  '$ Temp: 98.2 F (36.8 C)  (!) 97 F (36.1 C) (!) 97.5 F (36.4 C)  TempSrc: Axillary  Axillary Oral  SpO2:   100% 100%  Weight:      Height:         Intake/Output Summary (Last 24 hours) at 01/03/2023 1358 Last data filed at 01/03/2023 0800 Gross per 24 hour  Intake 826.71 ml  Output --  Net 826.71 ml      PHYSICAL EXAM  General: Well developed, well nourished, in no acute distress HEENT:  Normocephalic and atramatic Neck:  No JVD.  Lungs: Clear bilaterally to auscultation and percussion. Heart: HRRR . Normal S1 and S2 without gallops or murmurs.  Abdomen: Bowel sounds are positive, abdomen soft and non-tender  Msk:  Back normal, normal gait. Normal strength and tone for age. Extremities: No clubbing, cyanosis or edema.   Neuro: Alert and oriented X 3. Psych:  Good affect, responds appropriately   LABS: Basic Metabolic Panel: Recent Labs    01/01/23 0508 01/01/23 1604 01/02/23 0440 01/02/23 1638  NA 137   < > 135 132*  K 4.5   < > 5.2* 4.8  CL 105   < > 103 100  CO2 18*   < > 20* 19*  GLUCOSE 117*   < > 128* 131*  BUN 83*   < > 87* 86*  CREATININE 4.78*   < > 5.21* 5.42*  CALCIUM 8.3*   < > 8.3* 8.1*  MG 2.0  --   --   --   PHOS 6.5*  --   --   --    < > = values in this interval not displayed.   Liver Function Tests: Recent Labs    01/01/23 0508  ALBUMIN 2.2*   No results for input(s): "LIPASE", "AMYLASE" in the last 72 hours. CBC: Recent Labs    01/01/23 0508 01/02/23 0440  WBC 15.6* 17.4*  HGB 7.6* 8.1*  HCT 22.9* 24.7*  MCV 89.1 89.5  PLT 293 352   Cardiac Enzymes: No results for input(s): "CKTOTAL", "CKMB", "CKMBINDEX", "TROPONINI" in the last 72 hours. BNP: Invalid input(s): "POCBNP" D-Dimer: No results for  input(s): "DDIMER" in the last 72 hours. Hemoglobin A1C: No results for input(s): "HGBA1C" in the last 72 hours. Fasting Lipid Panel: No results for input(s): "CHOL", "HDL", "LDLCALC", "TRIG", "CHOLHDL", "LDLDIRECT" in the last 72 hours. Thyroid Function Tests: No results for input(s): "TSH", "T4TOTAL", "T3FREE", "THYROIDAB" in the last 72 hours.  Invalid input(s): "FREET3" Anemia Panel: No results for input(s): "VITAMINB12", "FOLATE", "FERRITIN", "TIBC", "IRON", "RETICCTPCT" in the last 72 hours.  No results found.   Echo EF 40-45% 12/22/2022  TELEMETRY: Dual-chamber pacing:  ASSESSMENT AND PLAN:  Principal Problem:   Severe sepsis with acute organ dysfunction (HCC) Active Problems:   Acute on chronic diastolic (congestive) heart failure (HCC)   Presence of cardiac pacemaker   Sick sinus syndrome (HCC)   Chronic kidney disease, stage 3b (HCC)   Paroxysmal atrial fibrillation (HCC)   Iron deficiency anemia, unspecified   Hyperlipidemia    1.  Elevated troponin, (447, 448, 451, 642, 576), in the absence of chest pain, likely demand supply ischemia in setting of beer sepsis, multiorgan failure, acute kidney injury  2.  Paroxysmal atrial fibrillation, poor candidate for anticoagulation 3.  Sick sinus syndrome, status post dual-chamber pacemaker 4.  Severe sepsis with multiorgan failure, now hospice care 5.  Acute kidney injury with underlying chronic kidney disease stage III, BUN and creatinine 86 and 5.42 respectively 6.  Hospice care  Recommendations  1.  Agree with current therapy 2.  Agree with hospice care 3.  Defer further cardiac diagnostics or recommendations at this time  Sign off for now, please call if any questions   [name], MD, PhD, [facility] 01/03/2023 1:58 PM

## 2023-01-03 NOTE — Progress Notes (Signed)
[facility] Progress Note  Patient: Devin Washington    VQQ:[id number]  DOA: 12/21/2022     Date of Service: the patient was seen and examined on 01/03/2023  Chief Complaint  Patient presents with   Respiratory Distress   Brief hospital course:  Devin Washington is a 89 y.o. male with medical history significant of atrial fibrillation, essential hypertension, chronic kidney disease stage IIIb, iron deficiency anemia, sick sinus syndrome status post pacemaker placement, hyperlipidemia, essential hypertension, chronic diastolic heart failure who was initially brought into the ER as a Devin Washington.  Patient was obtained there was significant hypoxic respiratory failure.  He is currently unable to give any history and is on BiPAP.  Patient arrived with oxygen sat of 62% on room air.  He was in respiratory distress on CPAP from EMS.  Workup so far showed severe sepsis with lactic acid of more than 9 and pH of 7.18.  Vitals are however stable.  Patient has chest x-ray that is negative.  Acute viral screen also negative.  No obvious source of sepsis.  At this point he is being admitted with acute hypoxic respiratory failure as well as severe sepsis of unknown source.  Workup is ongoing to determine the source.    Assessment and Plan:  # AKI on Chronic kidney disease stage III: Hydrate.  Monitor renal function Creatinine 3.75--3.2--2.6--2.5---3.69 --3.97--4.95--5.21 Monitor renal foods daily, monitor urine output Avoid nephrotoxic medications, use renal dose medications 1/14 US renal:  No evidence of obstructive uropathy. Mildly increased renal echogenicity as can be seen in the setting of medical renal disease. Bilateral pleural effusions. 1/15 again renal functions are deteriorating most likely due to hypotension  Bladder scan negative for any urinary retention,  1/17 AKI most likely secondary to prerenal due to hypotension, Nephrology consulted, recommended pressure support.  S/p Doxidope  1/18  --1/19  s/p Levophed IV infusion 1/19 pulmonary critical care was consulted, Levophed was weaned off, and started midodrine 10 mg p.o. 3 times daily 1/19 palliative care discussed goals of care with patient and patient's son over the phone, and proceeded with hospice care.  Active treatment was discontinued.  # Metabolic acidosis due to AKI CO2 81--15--21--17 --20 S/p Sodium bicarbonate IV push given,  and s/p sodium bicarbonate IV infusion till 1/12, transition to oral bicarbonate 1/17 started bicarbonate IV infusion due to metabolic acidosis.   Monitor BMP daily   # Severe sepsis with endorgan damage: Patient presented with severe hypoxic respiratory failure, although no evidence of pneumonia on chest x-ray.   CT C/a/p: Peripheral reticulations with bibasilar subpleural cystic changes suggestive of interstitial lung disease/pulmonary fibrosis. Urinalysis was not very impressive, urine culture multiple species, contamination S/p cefepime  (d/c'd Vanc & Flagyl) S/p vancomycin DC'd on 1/9, MRSA PCR negative, blood culture negative, WBC improving.  Procalcitonin 22 elevated at admission 1/12 CXR: Stable cardiac enlargement and chronic lung disease. No acute findings. 1/12 discontinued Flagyl, started empiric azithromycin 500 mg p.o. daily for 5 days Trend WBC count 1/15 CXR: Increased airspace disease at the left base may be due to atelectasis or pneumonia. Possible small left effusion. 1/19 started Zosyn IV  Pulmonary critical care was consulted, started Solu-Medrol 80 g IV 8 hourly stress dose steroids   # Hypertension and paroxysmal atrial fibrillation:  Rate was controlled on Home regimen. 1/16 patient was hypotensive, discontinued amlodipine and metoprolol home meds. s/p midodrine 10 mg p.o. TID. 1/17 s/p Dxidope which was discontinued and blood pressure did not improve.  On 1/18 Started Levophed IV infusion for pressure support as per nephrology recommendation patient developed AKI due  to low blood pressure Monitor BP and titrate medications accordingly   Right lower extremity DVT Duplex shows DVT in the posterior tibial vein Vascular surgery consulted, recommended Eliquis 2.5 milligram p.o. 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  Recommend Eliquis 2.5 mg twice daily for 3 months. got opinion via secure chat, no official consult done.  Follow-up outpatient    # Acute hypoxic respiratory failure with COPD exacerbation S/p BiPAP.  Continue antibiotics treatment and titrate of BiPAP S/p IV Solu-Medrol, discontinued steroids on 1/9 due to low hemoglobin, possible GI bleed.  Patient's respiratory failure resolved, currently saturating well on room air. Started Breo Ellipta inhaler, DuoNeb every 6 hourly scheduled followed by as needed Started Mucinex extremity gram p.o. twice daily and Tussionex for cough as needed   # Elevated troponin, type II MI most likely due to demand ischemia Troponin 189----476  continue to trend Continue to monitor on telemetry TTE shows LVEF 40 to 27%, grade 1 diastolic dysfunction, LV demonstrate regional wall motion abnormality EKG shows paced rhythm Patient remained chest pain-free Cardiology consulted, recommended no ischemic cardiac workup at this stage Started aspirin    # Anemia due to iron deficiency, monitor H&H Iron level 19, transferrin saturation 25% Folic acid 9.5 WNL Start oral iron supplement when stable, avoid IV iron due to active infection 1/9, Hb 6.4, transfuse 1 unit of PRBC 1/15 Hb 7.9 gradually dropping S/p Pantoprazole 40 mg IV BID, switch to p.o. Check FOBT   # Hypomagnesemia, mag repleted.  Resolved Monitor and replete as needed.   # severe lactic acidosis: Aggressively hydrate.  Continue antibiotics Lactic acidosis resolved, lactic acid 1.6 within normal range now. # sick sinus syndrome: Status post pacemaker placement.   Rate is now controlled.    # acute metabolic encephalopathy: Secondary to severe sepsis.  Resolved, patient is AOx3   # Pulmonary nodule, incidental finding on CT scan CT chest: Lingular subpleural 1.3 x 1 cm nodule. Consider one of the following in 3 months for both low-risk and high-risk individuals: (a) repeat chest CT, (b) follow-up PET-CT, or (c) tissue sampling. Patient was recommended to follow with PCP and pulmonary as an outpatient  PAD, abdominal aortic aneurysm CT A/P: Stable aneurysmal bilateral common iliac arteries. 6. Stable aneurysmal infrarenal abdominal aorta (3.4 cm). Recommend annual imaging followup by CTA or MRA.  Patient was recommended to follow with PCP for incidental findings as above  Vitamin D insufficiency: started vitamin D 50,000 units p.o. weekly, follow with PCP to repeat vitamin D level after 3 to 6 months.  Body mass index is 21.81 kg/m.  Interventions:    PT and OT eval done, recommend SNF placement     Diet: Heart healthy diet DVT Prophylaxis: Subcutaneous Heparin    Advance goals of care discussion: DNR 1/19 discussed with patient's son over the phone, overall prognosis is poor, condition is deteriorating.  We will continue current treatment for 1-2 more days and if there is no improvement then we will make him comfort care and proceed with hospice placement. 1/19 palliative care discussed goals of care with patient and patient's son over the phone, and proceeded with hospice care.  Active treatment was discontinued.  Family Communication: family was not present at bedside, at the time of interview.  The pt provided permission to discuss medical plan with the family. Opportunity  was given to ask question and all questions were answered satisfactorily.  1/15 called patient's granddaughter and left VM 1/16 discussed with patient's granddaughter over the phone, all question and concerns answered. 1/19 d/w patient's son as above 1/19 palliative  care discussed goals of care with patient and patient's son over the phone, and proceeded with hospice care.  Active treatment was discontinued.  Disposition:  Pt is from Home, admitted with sepsis, developed AKI, metabolic acidosis, and hypotension, still creatinine is elevated, started bicarb IV infusion and s/p Levophed IV infusion.  Palliative care was consulted, goals of care discussed and patient was transition to hospice care. Discharge to hospice facility when bed will be available.   Subjective:  No significant overnight events, patient was eating breakfast, lying comfortably, denied any acute distress.   Physical Exam: General: lying comfortably, mild  SOB Appear in mild distress, affect seems depressed Eyes: PERRLA ENT: Oral Mucosa Clear, moist, Hard of hearing  Neck: no JVD,  Cardiovascular: S1 and S2 Present, no Murmur,  Respiratory: good respiratory effort, Bilateral Air entry equal and Decreased, mild Crackles and no significant wheezing appreciated Abdomen: Bowel Sound present, Soft and no tenderness,  Skin: no rashes Extremities: 2-3+ edema b/l LE, Left calf thrombosed chronic aneurysm Neurologic: without any new focal findings Gait not checked due to patient safety concerns  Vitals:   01/02/23 2000 01/02/23 2100 01/02/23 2149 01/03/23 0849  BP: (!) 119/48 (!) 117/55 (!) 106/51 110/61  Pulse:   63 64  Resp: '18 15 12 14  '$ Temp: 98.2 F (36.8 C)  (!) 97 F (36.1 C) (!) 97.5 F (36.4 C)  TempSrc: Axillary  Axillary Oral  SpO2:   100% 100%  Weight:      Height:        Intake/Output Summary (Last 24 hours) at 01/03/2023 1206 Last data filed at 01/03/2023 0800 Gross per 24 hour  Intake 826.71 ml  Output --  Net 826.71 ml   Filed Weights   12/22/22 0019 12/31/22 1550  Weight: 68.9 kg 79.2 kg    Data Reviewed: I have personally reviewed and interpreted daily labs, tele strips, imagings as discussed above. I reviewed all nursing notes, pharmacy notes,  vitals, pertinent old records I have discussed plan of care as described above with RN and patient/family.  CBC: Recent Labs  Lab 12/29/22 0308 12/30/22 0502 12/31/22 0502 01/01/23 0508 01/02/23 0440  WBC 15.3* 12.9* 16.3* 15.6* 17.4*  HGB 7.9* 8.1* 8.2* 7.6* 8.1*  HCT 23.6* 25.0* 25.1* 22.9* 24.7*  MCV 88.4 89.3 90.0 89.1 89.5  PLT 220 269 304 293 505   Basic Metabolic Panel: Recent Labs  Lab 12/29/22 0308 12/30/22 0502 12/31/22 0502 01/01/23 0508 01/01/23 1604 01/02/23 0440 01/02/23 1638  NA 135 135 135 137 134* 135 132*  K 3.8 4.0 4.4 4.5 5.3* 5.2* 4.8  CL 108 106 108 105 104 103 100  CO2 21* 20* 15* 18* 18* 20* 19*  GLUCOSE 120* 115* 115* 117* 125* 128* 131*  BUN 70* 75* 78* 83* 83* 87* 86*  CREATININE 3.69* 3.97* 4.28* 4.78* 4.95* 5.21* 5.42*  CALCIUM 8.0* 8.3* 8.4* 8.3* 8.4* 8.3* 8.1*  MG 1.9 2.0 2.0 2.0  --   --   --   PHOS  --  4.9* 5.2* 6.5*  --   --   --     Studies: No results found.  Scheduled Meds:  fluticasone furoate-vilanterol  1 puff Inhalation Daily   guaiFENesin  600 mg Oral BID  pantoprazole  40 mg Oral BID AC   traZODone  25 mg Oral QHS   Continuous Infusions:  sodium chloride Stopped (01/03/23 0543)   PRN Meds: acetaminophen **OR** acetaminophen, albuterol, chlorpheniramine-HYDROcodone, HYDROmorphone (DILAUDID) injection, ipratropium-albuterol, ondansetron **OR** ondansetron (ZOFRAN) IV, mouth rinse, simethicone  Time spent: 35 minutes  Author: Val Riles. MD [facility] 01/03/2023 12:06 PM  To reach On-call, see care teams to locate the attending and reach out to them via [email or web address]. If 7PM-7AM, please contact night-coverage If you still have difficulty reaching the attending provider, please page the [facility] (Director on Call) for [facility] on amion for assistance.

## 2023-01-03 NOTE — TOC Transition Note (Signed)
Transition of Care [facility]) - CM/SW Discharge Note   Patient Details  Name: Devin Washington MRN: [id number] Date of Birth: 08-15-1923  [facility] CM/SW Contact:  Magnus Ivan, LCSW Phone Number: 01/03/2023, 1:38 PM   Clinical Narrative:    Patient to DC to [facility] today. Family has been updated by [facility].  EMS paperwork completed. RN to call report. [facility] arranged, patient is 3rd on the list. RN notified.    Final next level of care: [place] Barriers to Discharge: Barriers Resolved   Patient Goals and CMS Choice CMS Medicare.gov Compare Post Acute Care list provided to:: Patient Represenative (must comment) (grandaughter tanya)    Discharge Placement                  Patient to be transferred to facility by: [facility]      Discharge Plan and Services Additional resources added to the After Visit Summary for                                       Social Determinants of Health (SDOH) Interventions SDOH Screenings   Food Insecurity: No Food Insecurity (12/22/2022)  Housing: Low Risk  (12/22/2022)  Transportation Needs: No Transportation Needs (12/22/2022)  Utilities: Not At Risk (12/22/2022)  Tobacco Use: Medium Risk (12/22/2022)     Readmission Risk Interventions     No data to display

## 2023-01-03 NOTE — Discharge Summary (Signed)
[facility] Discharge Summary   Patient: Devin Washington XAJ:[id number]  PCP: Pcp, No  Date of admission: 12/21/2022   Date of discharge:  01/03/2023     Discharge Diagnoses:  Principal Problem:   Severe sepsis with acute organ dysfunction [facility] Active Problems:   Acute on chronic diastolic (congestive) heart failure (HCC)   Presence of cardiac pacemaker   Sick sinus syndrome (HCC)   Chronic kidney disease, stage 3b (HCC)   Paroxysmal atrial fibrillation (HCC)   Iron deficiency anemia, unspecified   Hyperlipidemia   Admitted From: Home Disposition: Hospice facility  Recommendations for Outpatient Follow-up:  Hospice Follow up LABS/TEST: None   Follow-up Information     Callwood, Karma Greaser D, MD. Go in 1 week(s).   Specialties: Cardiology, Internal Medicine Contact information: [place] [address] [phone]                Diet recommendation: Regular diet  Activity: The patient is advised to gradually reintroduce usual activities, as tolerated  Discharge Condition: stable  Code Status: DNR   History of present illness: As per the H and P dictated on admission Hospital Course:   Devin Washington is a 89 y.o. male with medical history significant of atrial fibrillation, essential hypertension, chronic kidney disease stage IIIb, iron deficiency anemia, sick sinus syndrome status post pacemaker placement, hyperlipidemia, essential hypertension, chronic diastolic heart failure who was initially brought into the ER as a Devin Washington.  Patient was obtained there was significant hypoxic respiratory failure.  He is currently unable to give any history and is on BiPAP.  Patient arrived with oxygen sat of 62% on room air.  He was in respiratory distress on CPAP from EMS.  Workup so far showed severe sepsis with lactic acid of more than 9 and pH of 7.18.  Vitals are however stable.  Patient has chest x-ray that is negative.  Acute viral screen also  negative.  No obvious source of sepsis.  At this point he is being admitted with acute hypoxic respiratory failure as well as severe sepsis of unknown source.  Workup is ongoing to determine the source.   # AKI on Chronic kidney disease stage III: Hydrate.  Monitor renal function Creatinine 3.75--3.2--2.6--2.5---3.69 --3.97--4.95--5.21 Monitor renal foods daily, monitor urine output Avoid nephrotoxic medications, use renal dose medications 1/14 US renal:  No evidence of obstructive uropathy. Mildly increased renal echogenicity as can be seen in the setting of medical renal disease. Bilateral pleural effusions. 1/15 again renal functions are deteriorating most likely due to hypotension  Bladder scan negative for any urinary retention,  1/17 AKI most likely secondary to prerenal due to hypotension, Nephrology consulted, recommended pressure support.  S/p Doxidope  1/18 --1/19  s/p Levophed IV infusion 1/19 pulmonary critical care was consulted, Levophed was weaned off, and started midodrine 10 mg p.o. 3 times daily 1/19 palliative care discussed goals of care with patient and patient's son over the phone, and proceeded with hospice care.  Active treatment was discontinued.   # Metabolic acidosis due to AKI CO2 81--15--21--17 --20, S/p Sodium bicarbonate IV push given,  and s/p sodium bicarbonate IV infusion till 1/12, transition to p.o. bicarb.  Patient became again acidotic so IV bicarbonate was started. # Severe sepsis with endorgan damage: Patient presented with severe hypoxic respiratory failure, although no evidence of pneumonia on chest x-ray.   CT C/a/p: Peripheral reticulations with bibasilar subpleural cystic changes suggestive of interstitial lung disease/pulmonary fibrosis. Urinalysis was not very  impressive, urine culture multiple species, contamination S/p cefepime  (d/c'd Vanc & Flagyl) S/p vancomycin DC'd on 1/9, MRSA PCR negative, blood culture negative, WBC improving. Procalcitonin  22 elevated at admission.  1/12 CXR: Stable cardiac enlargement and chronic lung disease. No acute findings. 1/12 discontinued Flagyl, started empiric azithromycin 500 mg p.o. daily for 5 days 1/15 CXR: Increased airspace disease at the left base may be due to atelectasis or pneumonia. Possible small left effusion. Pulmonary critical care was consulted, started Solu-Medrol 80 g IV 8 hourly stress dose steroids. On 1/19 started Zosyn IV and dc/d when patient was became hospice care.  # Hypertension and paroxysmal atrial fibrillation: HR remain controlled on home medications. On 1/16 patient was hypotensive, discontinued amlodipine and metoprolol home meds. s/p midodrine 10 mg p.o. TID. 1/17 s/p Dxidope which was discontinued and blood pressure did not improve.  On 1/18 Started Levophed IV infusion for pressure support as per nephrology recommendation patient developed AKI due to low blood pressure.  Discontinued all meds due to hospice care # Right lower extremity DVT, Duplex shows DVT in the posterior tibial vein Vascular surgery consulted, recommended Eliquis 2.5 milligram p.o. 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  Recommend Eliquis 2.5 mg twice daily for 3 months. got opinion via secure chat, no official consult done.  Discontinued Eliquis due to hospice care.  # Acute hypoxic respiratory failure with COPD exacerbation S/p BiPAP.  Continue antibiotics treatment and titrate of BiPAP S/p IV Solu-Medrol, discontinued steroids on 1/9 due to low hemoglobin, possible GI bleed. S/p Breo Ellipta inhaler, DuoNeb every 6 hourly, s/p Mucinex and Tussionex. # Elevated troponin, type II MI most likely due to demand ischemia Troponin 189----476. TTE shows LVEF 40 to 26%, grade 1 diastolic dysfunction, LV demonstrate regional wall motion abnormality. EKG shows paced rhythm. Patient remained chest pain-free. Cardiology consulted,  recommended no ischemic cardiac workup at this stage. S/p ASA # Anemia due to iron deficiency,Iron level 19, transferrin saturation 94%, Folic acid 9.5 WNL. S/p oral iron. avoid IV iron due to active infection. 1/9, Hb 6.4, transfuse 1 unit of PRBC. On 1/15 Hb 7.9 gradually dropping. S/p Pantoprazole 40 mg IV BID, switch to p.o. # Hypomagnesemia, mag repleted.  Resolved # severe lactic acidosis: s/p aggressively hydrate and antibiotics, Lactic acidosis resolved, lactic acid 1.6 within normal range now. # sick sinus syndrome: Status post pacemaker placement.  Rate is now controlled. # acute metabolic encephalopathy: due to severe sepsis.  Resolved.   Pulmonary nodule, incidental finding on CT scan. CT chest: Lingular subpleural 1.3 x 1 cm nodule. Consider one of the following in 3 months for both low-risk and high-risk individuals: (a) repeat chest CT, (b) follow-up PET-CT, or (c) tissue sampling.  Patient is under hospice care now, no need to follow-up. PAD, abdominal aortic aneurysm: CT A/P: Stable aneurysmal bilateral common iliac arteries. 6. Stable aneurysmal infrarenal abdominal aorta (3.4 cm). Recommend annual imaging followup by CTA or MRA. Patient is under hospice care now, no need to follow-up.  Vitamin D insufficiency: s/p vitamin D 50,000 units p.o. weekly given during hospital stay.,  No more needs.  Body mass index is 25.05 kg/m.  Nutrition Interventions:  Patient was seen by physical therapy, who recommended SNF, but patient continued deteriorated, palliative care was consulted and patient was appropriate for hospice.  Patient got excepted by hospice team for inpatient hospice admission today.  Consultants: PCCM, nephrology, palliative care and hospice consult Procedures: None  Discharge Exam: General: Appear in no distress, no Rash; Oral Mucosa Clear, moist. Cardiovascular: S1 and S2 Present, no Murmur, Respiratory: normal respiratory effort, Bilateral Air entry present and mild  bilateral crackles, mild occasional wheezes Abdomen: Bowel Sound present, Soft and no tenderness, no hernia Extremities: 2-3+ b/l Pedal edema, no calf tenderness, Left calf thrombosed chronic aneurysm  Neurology: alert and oriented to time, place, and person affect appropriate.  Filed Weights   12/22/22 0019 12/31/22 1550  Weight: 68.9 kg 79.2 kg   Vitals:   01/02/23 2149 01/03/23 0849  BP: (!) 106/51 110/61  Pulse: 63 64  Resp: 12 14  Temp: (!) 97 F (36.1 C) (!) 97.5 F (36.4 C)  SpO2: 100% 100%    DISCHARGE MEDICATION: Allergies as of 01/03/2023   No Known Allergies      Medication List     STOP taking these medications    amLODipine 10 MG tablet Commonly known as: NORVASC   Aspir-Low 81 MG tablet Generic drug: aspirin EC   benazepril 20 MG tablet Commonly known as: LOTENSIN   cyanocobalamin 1000 MCG tablet   ferrous sulfate 325 (65 FE) MG EC tablet   furosemide 40 MG tablet Commonly known as: LASIX   metoprolol tartrate 25 MG tablet Commonly known as: LOPRESSOR   simvastatin 40 MG tablet Commonly known as: ZOCOR   traMADol 50 MG tablet Commonly known as: ULTRAM       TAKE these medications    Protonix 40 MG tablet Generic drug: pantoprazole Take 40 mg by mouth 2 (two) times daily.   traZODone 50 MG tablet Commonly known as: DESYREL Take 50 mg by mouth at bedtime.       No Known Allergies Discharge Instructions     Diet general   Complete by: As directed    Discharge instructions   Complete by: As directed    Follow hospice care   Increase activity slowly   Complete by: As directed        The results of significant diagnostics from this hospitalization (including imaging, microbiology, ancillary and laboratory) are listed below for reference.    Significant Diagnostic Studies: CT HEAD WO CONTRAST (5MM)  Result Date: 12/29/2022 CLINICAL DATA:  Atrial fibrillation, hypertension, neurologic deficit EXAM: CT HEAD WITHOUT  CONTRAST TECHNIQUE: Contiguous axial images were obtained from the base of the skull through the vertex without intravenous contrast. RADIATION DOSE REDUCTION: This exam was performed according to the departmental dose-optimization program which includes automated exposure control, adjustment of the mA and/or kV according to patient size and/or use of iterative reconstruction technique. COMPARISON:  01/30/2021 FINDINGS: Brain: Stable chronic small-vessel ischemic changes within the periventricular white matter. No evidence of acute infarct or hemorrhage. Stable cerebral atrophy, with ex vacuo dilatation of the lateral ventricles. Remaining midline structures are unremarkable. No acute extra-axial fluid collections. No mass effect. Vascular: Stable atherosclerosis.  No hyperdense vessel. Skull: Normal. Negative for fracture or focal lesion. Sinuses/Orbits: No acute finding. Other: None. IMPRESSION: 1. Stable head CT, no acute intracranial process. Electronically Signed   By: Randa Ngo M.D.   On: 12/29/2022 15:48   DG Chest Port 1 View  Result Date: 12/29/2022 CLINICAL DATA:  Pleural effusion EXAM: PORTABLE CHEST 1 VIEW COMPARISON:  12/26/2022, CT 12/21/2022 FINDINGS: Emphysema. Left-sided pacing device as before. Cardiomegaly with aortic atherosclerosis. Nodular opacity in the left mid lung. Possible small left effusion. Increased airspace disease at left lung base. IMPRESSION: 1. Increased airspace disease at the left base may be  due to atelectasis or pneumonia. Possible small left effusion. 2. Nodular opacity in the left mid lung, this appears to correspond to the possible nodule on chest CT, reference report 12/21/2022 for additional recommendations. 3. Cardiomegaly. 4. Emphysema. Electronically Signed   By: Donavan Foil M.D.   On: 12/29/2022 15:24   US RENAL  Result Date: 12/28/2022 CLINICAL DATA:  [id number] Worsening renal function [id number] EXAM: RENAL / URINARY TRACT ULTRASOUND COMPLETE COMPARISON:   December 21, 2022 FINDINGS: Right Kidney: Renal measurements: 9.1 x 4.2 x 4.9 cm = volume: 96 mL. Mildly increased renal echogenicity. No suspicious mass or hydronephrosis visualized. Benign cyst of the inferior pole measures up to 10 mm (for which no dedicated imaging follow-up is recommended) . Left Kidney: Renal measurements: 10.5 x 4.5 x 4.4 cm = volume: 109 mL. Mildly increased renal echogenicity. No suspicious mass or hydronephrosis visualized. Incidental note of several benign cysts, the largest of which is a benign exophytic cyst measuring up to 17 mm (for which no dedicated imaging follow-up is recommended) . Bladder: Mildly trabeculated appearance likely reflecting sequela of chronic outlet obstruction. Other: Bilateral pleural effusions. IMPRESSION: 1. No evidence of obstructive uropathy. 2. Mildly increased renal echogenicity as can be seen in the setting of medical renal disease. 3. Bilateral pleural effusions. Electronically Signed   By: Valentino Saxon M.D.   On: 12/28/2022 18:15   DG Chest Port 1 View  Result Date: 12/26/2022 CLINICAL DATA:  Shortness of breath, sepsis and hypoxia. EXAM: PORTABLE CHEST 1 VIEW COMPARISON:  12/21/2022 FINDINGS: Stable cardiac enlargement and appearance of pacemaker. Stable chronic lung disease and bibasilar scarring/atelectasis. No overt pulmonary consolidation, edema or pleural fluid identified. No pneumothorax. IMPRESSION: Stable cardiac enlargement and chronic lung disease. No acute findings. Electronically Signed   By: Aletta Edouard M.D.   On: 12/26/2022 10:34   US Venous Img Lower Bilateral (DVT)  Result Date: 12/24/2022 .  CLINICAL DATA: Bilateral lower extremity swelling history of left popliteal artery aneurysm and prior embolization EXAM: BILATERAL LOWER EXTREMITY VENOUS DOPPLER ULTRASOUND TECHNIQUE: Gray-scale sonography with graded compression, as well as color Doppler and duplex ultrasound were performed to evaluate the lower extremity deep  venous systems from the level of the common femoral vein and including the common femoral, femoral, profunda femoral, popliteal and calf veins including the posterior tibial, peroneal and gastrocnemius veins when visible. The superficial great saphenous vein was also interrogated. Spectral Doppler was utilized to evaluate flow at rest and with distal augmentation maneuvers in the common femoral, femoral and popliteal veins. COMPARISON:  01/21/2021 FINDINGS: RIGHT LOWER EXTREMITY Common Femoral Vein: No evidence of thrombus. Normal compressibility, respiratory phasicity and response to augmentation. Saphenofemoral Junction: No evidence of thrombus. Normal compressibility and flow on color Doppler imaging. Profunda Femoral Vein: No evidence of thrombus. Normal compressibility and flow on color Doppler imaging. Femoral Vein: No evidence of thrombus. Normal compressibility, respiratory phasicity and response to augmentation. Popliteal Vein: No evidence of thrombus. Normal compressibility, respiratory phasicity and response to augmentation. Calf Veins: Occlusive echogenic thrombus within the right posterior tibial vein. Superficial Great Saphenous Vein: No evidence of thrombus. Normal compressibility. Venous Reflux:  None. Other Findings: Large nonvascular heterogeneous masslike structure which appears to surround a stent in the right popliteal region likely a thrombosed aneurysm sac. LEFT LOWER EXTREMITY Common Femoral Vein: No evidence of thrombus. Normal compressibility, respiratory phasicity and response to augmentation. Saphenofemoral Junction: No evidence of thrombus. Normal compressibility and flow on color Doppler imaging. Profunda Femoral Vein: No evidence of  thrombus. Normal compressibility and flow on color Doppler imaging. Femoral Vein: No evidence of thrombus. Normal compressibility, respiratory phasicity and response to augmentation. Popliteal Vein: No evidence of thrombus. Normal compressibility,  respiratory phasicity and response to augmentation. Calf Veins: No evidence of thrombus. Normal compressibility and flow on color Doppler imaging. Superficial Great Saphenous Vein: No evidence of thrombus. Normal compressibility. Venous Reflux:  None. 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 Additional masslike structure more proximally within the left thigh which does have some peripheral flow suggesting a partially thrombosed aneurysm. IMPRESSION: 1. Occlusive DVT within the right posterior tibial vein. 2. No evidence of left lower extremity deep vein thrombosis. 3. Large elongated heterogeneous mass-like structure in the posterior left thigh standing to the popliteal fossa, similar in appearance to the prior ultrasound and most compatible with a thrombosed popliteal artery aneurysm sac. Additional masslike structure more proximally within the left thigh does have some peripheral flow suggesting a partially thrombosed aneurysm sac likely involving the left common femoral artery. 4. Large nonvascular heterogeneous masslike structure which appears to surround a stent in the right popliteal region likely a thrombosed aneurysm sac. 5. Similar findings have been present on the previous exams. Correlate for symptoms of peripheral arterial disease. If further imaging evaluation is clinically warranted, a CTA run-off study could be considered. Electronically Signed   By: Davina Poke D.O.   On: 12/24/2022 19:41   ECHOCARDIOGRAM COMPLETE  Result Date: 12/22/2022    ECHOCARDIOGRAM REPORT   Patient Name:   Devin Washington Date of Exam: 12/22/2022 Medical Rec #:  [id number]       Height:       70.0 in Accession #:    [id number]      Weight:       152.0 lb Date of Birth:  25-Dec-1922       BSA:          1.858 m Patient Age:     89 years        BP:           128/57 mmHg Patient Gender: M               HR:           81 bpm. Exam Location:  [facility] Procedure: 2D Echo, Cardiac Doppler and Color Doppler Indications:     Elevated Troponin  History:         Patient has no prior history of Echocardiogram examinations.                  Pacemaker. Diastolic chronic CHF, paroxysmal Afib.  Sonographer:     Sherrie Sport Referring Phys:  Pegram Diagnosing Phys: Kathlyn Sacramento MD  Sonographer Comments: Image acquisition challenging due to patient body habitus. IMPRESSIONS  1. Left ventricular ejection fraction, by estimation, is 40 to 45%. The left ventricle has mildly decreased function. The left ventricle demonstrates regional wall motion abnormalities (see scoring diagram/findings for description). There is moderate left ventricular hypertrophy. Left ventricular diastolic parameters are consistent with Grade I diastolic dysfunction (impaired relaxation). There is severe hypokinesis of the left ventricular, apical segment.  2. Right ventricular systolic function is normal. The right ventricular size is mildly enlarged. There is normal pulmonary artery systolic pressure.  3. Left atrial size was mild to moderately dilated.  4.  Right atrial size was mildly dilated.  5. The mitral valve is degenerative. Moderate mitral valve regurgitation. No evidence of mitral stenosis.  6. The aortic valve is calcified. There is severe calcifcation of the aortic valve. There is moderate thickening of the aortic valve. Aortic valve regurgitation is mild to moderate. Severe aortic valve stenosis. Aortic valve area, by VTI measures 0.74 cm. Aortic valve mean gradient measures 30.2 mmHg.  7. The inferior vena cava is normal in size with greater than 50% respiratory variability, suggesting right atrial pressure of 3 mmHg. FINDINGS  Left Ventricle: Left ventricular ejection fraction, by estimation, is 40 to 45%. The left ventricle has mildly decreased function. The  left ventricle demonstrates regional wall motion abnormalities. The left ventricular internal cavity size was normal in size. There is moderate left ventricular hypertrophy. Left ventricular diastolic parameters are consistent with Grade I diastolic dysfunction (impaired relaxation). Right Ventricle: The right ventricular size is mildly enlarged. No increase in right ventricular wall thickness. Right ventricular systolic function is normal. There is normal pulmonary artery systolic pressure. The tricuspid regurgitant velocity is 2.74  m/s, and with an assumed right atrial pressure of 3 mmHg, the estimated right ventricular systolic pressure is 34.2 mmHg. Left Atrium: Left atrial size was mild to moderately dilated. Right Atrium: Right atrial size was mildly dilated. Pericardium: There is no evidence of pericardial effusion. Mitral Valve: The mitral valve is degenerative in appearance. There is moderate thickening of the mitral valve leaflet(s). Moderate mitral valve regurgitation. No evidence of mitral valve stenosis. Tricuspid Valve: The tricuspid valve is normal in structure. Tricuspid valve regurgitation is trivial. No evidence of tricuspid stenosis. Aortic Valve: The aortic valve is calcified. There is severe calcifcation of the aortic valve. There is moderate thickening of the aortic valve. Aortic valve regurgitation is mild to moderate. Aortic regurgitation PHT measures 351 msec. Severe aortic stenosis is present. Aortic valve mean gradient measures 30.2 mmHg. Aortic valve peak gradient measures 53.7 mmHg. Aortic valve area, by VTI measures 0.74 cm. Pulmonic Valve: The pulmonic valve was normal in structure. Pulmonic valve regurgitation is trivial. No evidence of pulmonic stenosis. Aorta: The aortic root is normal in size and structure. Venous: The inferior vena cava is normal in size with greater than 50% respiratory variability, suggesting right atrial pressure of 3 mmHg. IAS/Shunts: No atrial level shunt  detected by color flow Doppler. Additional Comments: A device lead is visualized.  LEFT VENTRICLE PLAX 2D LVIDd:         4.40 cm   Diastology LVIDs:         2.70 cm   LV e' medial:    5.77 cm/s LV PW:         1.20 cm   LV E/e' medial:  12.2 LV IVS:        1.70 cm   LV e' lateral:   5.44 cm/s LVOT diam:     2.00 cm   LV E/e' lateral: 12.9 LV SV:         61 LV SV Index:   33 LVOT Area:     3.14 cm  RIGHT VENTRICLE RV Basal diam:  5.70 cm RV Mid diam:    4.20 cm RV S prime:     20.50 cm/s TAPSE (M-mode): 3.5 cm LEFT ATRIUM             Index        RIGHT ATRIUM           Index LA diam:  3.90 cm 2.10 cm/m   RA Area:     20.70 cm LA Vol (A2C):   93.9 ml 50.55 ml/m  RA Volume:   62.40 ml  33.59 ml/m LA Vol (A4C):   34.8 ml 18.73 ml/m LA Biplane Vol: 58.4 ml 31.44 ml/m  AORTIC VALVE AV Area (Vmax):    0.71 cm AV Area (Vmean):   0.74 cm AV Area (VTI):     0.74 cm AV Vmax:           366.50 cm/s AV Vmean:          258.000 cm/s AV VTI:            0.821 m AV Peak Grad:      53.7 mmHg AV Mean Grad:      30.2 mmHg LVOT Vmax:         83.00 cm/s LVOT Vmean:        60.900 cm/s LVOT VTI:          0.193 m LVOT/AV VTI ratio: 0.24 AI PHT:            351 msec  AORTA Ao Root diam: 3.50 cm MITRAL VALVE                TRICUSPID VALVE MV Area (PHT): 4.57 cm     TR Peak grad:   30.0 mmHg MV Decel Time: 166 msec     TR Vmax:        274.00 cm/s MV E velocity: 70.20 cm/s MV A velocity: 124.00 cm/s  SHUNTS MV E/A ratio:  0.57         Systemic VTI:  0.19 m                             Systemic Diam: 2.00 cm Kathlyn Sacramento MD Electronically signed by Kathlyn Sacramento MD Signature Date/Time: 12/22/2022/3:33:14 PM    Final    CT CHEST ABDOMEN PELVIS WO CONTRAST  Result Date: 12/21/2022 CLINICAL DATA:  Sepsis EXAM: CT CHEST, ABDOMEN AND PELVIS WITHOUT CONTRAST TECHNIQUE: Multidetector CT imaging of the chest, abdomen and pelvis was performed following the standard protocol without IV contrast. RADIATION DOSE REDUCTION: This exam was  performed according to the departmental dose-optimization program which includes automated exposure control, adjustment of the mA and/or kV according to patient size and/or use of iterative reconstruction technique. COMPARISON:  CT abdomen pelvis 01/21/2021 FINDINGS: CT CHEST FINDINGS Cardiovascular: Left chest wall dual lead pacemaker in grossly appropriate position. Prominent heart size. No significant pericardial effusion. The thoracic aorta is normal in caliber. Severe atherosclerotic plaque of the thoracic aorta. Aortic valve leaflet calcifications. At least 3 vessel coronary artery calcifications. Cardiac changes suggestive of anemia. Mediastinum/Nodes: No gross hilar adenopathy, noting limited sensitivity for the detection of hilar adenopathy on this noncontrast study. No enlarged mediastinal or axillary lymph nodes. Thyroid gland, trachea, and esophagus demonstrate no significant findings. Small hiatal hernia. Lungs/Pleura: Mild bronchial wall thickening. Mild paraseptal and centrilobular emphysematous changes. Mild peripheral reticulations with bibasilar subpleural cystic changes . No focal consolidation. Lingular subpleural 1.3 x 1 cm nodule (4:86). No pulmonary mass. No pleural effusion. No pneumothorax. Musculoskeletal: No chest wall abnormality. No suspicious lytic or blastic osseous lesions. No acute displaced fracture. Multilevel severe degenerative changes of the spine. Severe degenerative changes of the right shoulder. CT ABDOMEN PELVIS FINDINGS Hepatobiliary: No focal liver abnormality. No gallstones, gallbladder wall thickening, or pericholecystic fluid. No biliary dilatation. Pancreas: No focal  lesion. Normal pancreatic contour. No surrounding inflammatory changes. No main pancreatic ductal dilatation. Spleen: Normal in size without focal abnormality. Adrenals/Urinary Tract: No adrenal nodule bilaterally. No nephrolithiasis and no hydronephrosis. Fluid density lesions likely represent simple  renal cysts-no further follow-up indicated. Redemonstration of a subcentimeter hyperdense lesion within the left kidney (2:67) too small to characterize. No ureterolithiasis or hydroureter. The urinary bladder is unremarkable. Stomach/Bowel: Stomach is within normal limits. No evidence of bowel wall thickening or dilatation. Appendix appears normal. Vascular/Lymphatic: Stable infrarenal abdominal aorta aneurysm measuring up to 3.4 x 3.2 cm extending approximately 3 cm in the craniocaudal dimension. Stable aneurysmal right common iliac artery measuring 2.9 cm. Stable aneurysmal left common iliac artery measuring 1.8 cm. Mild atherosclerotic plaque of the aorta and its branches. No abdominal, pelvic, or inguinal lymphadenopathy. Reproductive: Prostate is unremarkable. Other: No intraperitoneal free fluid. No intraperitoneal free gas. No organized fluid collection. Musculoskeletal: No abdominal wall hernia or abnormality. No suspicious lytic or blastic osseous lesions. No acute displaced fracture. Multilevel severe degenerative changes of the spine. IMPRESSION: 1. Lingular subpleural 1.3 x 1 cm nodule. Consider one of the following in 3 months for both low-risk and high-risk individuals: (a) repeat chest CT, (b) follow-up PET-CT, or (c) tissue sampling. This recommendation follows the consensus statement: Guidelines for Management of Incidental Pulmonary Nodules Detected on CT Images: From the Fleischner Society 2017; Radiology 2017; 284:228-243. 2. Peripheral reticulations with bibasilar subpleural cystic changes suggestive of interstitial lung disease/pulmonary fibrosis. 3. Small hiatal hernia. 4. Cardiac changes suggestive of anemia. 5. Stable aneurysmal bilateral common iliac arteries. 6. Stable aneurysmal infrarenal abdominal aorta (3.4 cm). Recommend annual imaging followup by CTA or MRA. This recommendation follows 2010 ACCF/AHA/AATS/ACR/ASA/SCA/SCAI/SIR/STS/SVM Guidelines for the Diagnosis and Management of  Patients with Thoracic Aortic Disease. Circulation.2010; 121: C376-E831. Aortic aneurysm NOS (ICD10-I71.9). 7. Aortic Atherosclerosis (ICD10-I70.0) - severe, including at least 3 vessel coronary artery and aortic valve leaflet calcifications-correlate for aortic stenosis. 8. Emphysema (ICD10-J43.9). 9. Limited evaluation on this noncontrast study. Electronically Signed   By: Iven Finn M.D.   On: 12/21/2022 23:13   DG Chest Port 1 View  Result Date: 12/21/2022 CLINICAL DATA:  Questionable sepsis - evaluate for abnormality. Respiratory distress. EXAM: PORTABLE CHEST 1 VIEW COMPARISON:  Chest radiograph 01/22/2021 FINDINGS: The patient is rotated to the left with unchanged cardiomediastinal silhouette. 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 Aortic atherosclerosis is noted. Chronic interstitial coarsening is similar to the prior study. No acute airspace consolidation, overt pulmonary edema, sizable pleural effusion, or pneumothorax is identified. No acute osseous abnormality is seen. IMPRESSION: No active disease. Electronically Signed   By: Logan Bores M.D.   On: 12/21/2022 19:17    Microbiology: No results found for this or any previous visit (from the past 240 hour(s)).   Labs: CBC: Recent Labs  Lab 12/29/22 0308 12/30/22 0502 12/31/22 0502 01/01/23 0508 01/02/23 0440  WBC 15.3* 12.9* 16.3* 15.6* 17.4*  HGB 7.9* 8.1* 8.2* 7.6* 8.1*  HCT 23.6* 25.0* 25.1* 22.9* 24.7*  MCV 88.4 89.3 90.0 89.1 89.5  PLT 220 269 304 293 517   Basic Metabolic Panel: Recent Labs  Lab 12/29/22 0308 12/30/22 0502 12/31/22 0502 01/01/23 0508 01/01/23 1604 01/02/23 0440 01/02/23 1638  NA 135 135 135 137 134* 135 132*  K 3.8 4.0 4.4 4.5 5.3* 5.2* 4.8  CL 108 106 108 105 104 103 100  CO2 21* 20* 15* 18* 18* 20* 19*  GLUCOSE 120* 115* 115* 117* 125* 128* 131*  BUN 70*  75* 78* 83* 83* 87* 86*  CREATININE 3.69* 3.97* 4.28* 4.78* 4.95* 5.21* 5.42*  CALCIUM 8.0* 8.3* 8.4* 8.3* 8.4* 8.3* 8.1*  MG 1.9  2.0 2.0 2.0  --   --   --   PHOS  --  4.9* 5.2* 6.5*  --   --   --    Liver Function Tests: Recent Labs  Lab 01/01/23 0508  ALBUMIN 2.2*   No results for input(s): "LIPASE", "AMYLASE" in the last 168 hours. No results for input(s): "AMMONIA" in the last 168 hours. Cardiac Enzymes: No results for input(s): "CKTOTAL", "CKMB", "CKMBINDEX", "TROPONINI" in the last 168 hours. BNP (last 3 results) No results for input(s): "BNP" in the last 8760 hours. CBG: Recent Labs  Lab 01/02/23 0335 01/02/23 0741 01/02/23 1123 01/02/23 1540 01/02/23 1950  GLUCAP 110* 127* 180* 172* 135*    Time spent: 35 minutes  Signed:  Val Riles  [facility] 01/03/2023 1:50 PM

## 2023-01-03 NOTE — Progress Notes (Signed)
[facility]) Hospice hospital liaison note   Referral received for Hospice Home.    Met at bedside with patient and talked with son Claven by phone. Hospice home is able to accept patient this afternoon once consents are complete.    RN staff, you may call report at any time to [phone], room is assigned when report is called.  Please leave IV intact.    Please send completed DNR with patient.   Updated attending and [facility] manager via [place].  Thank you for the opportunity to participate in this patient's care Jhonnie Garner, BSN, RN Hospice nurse liaison [phone]

## 2023-01-05 ENCOUNTER — Encounter: Payer: Self-pay | Admitting: Oncology

## 2023-01-05 ENCOUNTER — Encounter: Payer: Self-pay | Admitting: Nurse Practitioner

## 2023-01-07 ENCOUNTER — Other Ambulatory Visit: Payer: Self-pay | Admitting: *Deleted

## 2023-01-07 DIAGNOSIS — D631 Anemia in chronic kidney disease: Secondary | ICD-10-CM

## 2023-01-09 ENCOUNTER — Inpatient Hospital Stay: Payer: Medicare HMO

## 2023-01-09 ENCOUNTER — Telehealth: Payer: Self-pay | Admitting: *Deleted

## 2023-01-09 ENCOUNTER — Inpatient Hospital Stay: Payer: Medicare HMO | Admitting: Nurse Practitioner

## 2023-01-09 NOTE — Progress Notes (Deleted)
Patient has gone home from hospital on 1/20 with hospice. Will cancel appt.

## 2023-01-09 NOTE — Telephone Encounter (Signed)
Patient no showed for apts with Lauren today. I reviewed chart. Appears that patient was d/c on hospice care.

## 2023-01-15 DEATH — deceased

## 2023-03-10 NOTE — Progress Notes (Signed)
Appt cancelled
